# Patient Record
Sex: Male | Born: 1955 | Race: White | Hispanic: No | Marital: Married | Smoking: Former smoker
Health system: Southern US, Community
[De-identification: ages and names within clinical notes are randomized; demographics above are authoritative.]

## PROBLEM LIST (undated history)

## (undated) DIAGNOSIS — E785 Hyperlipidemia, unspecified: Secondary | ICD-10-CM

## (undated) DIAGNOSIS — Z720 Tobacco use: Secondary | ICD-10-CM

## (undated) DIAGNOSIS — F419 Anxiety disorder, unspecified: Secondary | ICD-10-CM

## (undated) DIAGNOSIS — I2510 Atherosclerotic heart disease of native coronary artery without angina pectoris: Secondary | ICD-10-CM

## (undated) DIAGNOSIS — D126 Benign neoplasm of colon, unspecified: Secondary | ICD-10-CM

## (undated) DIAGNOSIS — K222 Esophageal obstruction: Secondary | ICD-10-CM

## (undated) DIAGNOSIS — K2970 Gastritis, unspecified, without bleeding: Secondary | ICD-10-CM

## (undated) DIAGNOSIS — D010 Carcinoma in situ of colon: Secondary | ICD-10-CM

## (undated) DIAGNOSIS — K559 Vascular disorder of intestine, unspecified: Secondary | ICD-10-CM

## (undated) DIAGNOSIS — F32A Depression, unspecified: Secondary | ICD-10-CM

## (undated) DIAGNOSIS — I219 Acute myocardial infarction, unspecified: Secondary | ICD-10-CM

## (undated) DIAGNOSIS — D72829 Elevated white blood cell count, unspecified: Secondary | ICD-10-CM

## (undated) DIAGNOSIS — K649 Unspecified hemorrhoids: Secondary | ICD-10-CM

## (undated) DIAGNOSIS — J45909 Unspecified asthma, uncomplicated: Secondary | ICD-10-CM

## (undated) DIAGNOSIS — I10 Essential (primary) hypertension: Secondary | ICD-10-CM

## (undated) DIAGNOSIS — K5790 Diverticulosis of intestine, part unspecified, without perforation or abscess without bleeding: Secondary | ICD-10-CM

## (undated) DIAGNOSIS — R55 Syncope and collapse: Secondary | ICD-10-CM

## (undated) DIAGNOSIS — J449 Chronic obstructive pulmonary disease, unspecified: Secondary | ICD-10-CM

## (undated) DIAGNOSIS — K219 Gastro-esophageal reflux disease without esophagitis: Secondary | ICD-10-CM

## (undated) DIAGNOSIS — F329 Major depressive disorder, single episode, unspecified: Secondary | ICD-10-CM

## (undated) DIAGNOSIS — K449 Diaphragmatic hernia without obstruction or gangrene: Secondary | ICD-10-CM

## (undated) DIAGNOSIS — G4730 Sleep apnea, unspecified: Secondary | ICD-10-CM

## (undated) DIAGNOSIS — R911 Solitary pulmonary nodule: Secondary | ICD-10-CM

## (undated) DIAGNOSIS — K922 Gastrointestinal hemorrhage, unspecified: Secondary | ICD-10-CM

## (undated) DIAGNOSIS — G2581 Restless legs syndrome: Secondary | ICD-10-CM

## (undated) DIAGNOSIS — K51411 Inflammatory polyps of colon with rectal bleeding: Secondary | ICD-10-CM

## (undated) DIAGNOSIS — A048 Other specified bacterial intestinal infections: Secondary | ICD-10-CM

## (undated) HISTORY — DX: Anxiety disorder, unspecified: F41.9

## (undated) HISTORY — DX: Restless legs syndrome: G25.81

## (undated) HISTORY — DX: Inflammatory polyps of colon with rectal bleeding: K51.411

## (undated) HISTORY — DX: Sleep apnea, unspecified: G47.30

## (undated) HISTORY — DX: Esophageal obstruction: K22.2

## (undated) HISTORY — PX: CARDIAC CATHETERIZATION: SHX172

## (undated) HISTORY — DX: Major depressive disorder, single episode, unspecified: F32.9

## (undated) HISTORY — DX: Gastro-esophageal reflux disease without esophagitis: K21.9

## (undated) HISTORY — DX: Depression, unspecified: F32.A

## (undated) HISTORY — DX: Essential (primary) hypertension: I10

## (undated) HISTORY — DX: Benign neoplasm of colon, unspecified: D12.6

## (undated) HISTORY — PX: OTHER SURGICAL HISTORY: SHX169

## (undated) HISTORY — DX: Diaphragmatic hernia without obstruction or gangrene: K44.9

## (undated) HISTORY — DX: Gastritis, unspecified, without bleeding: K29.70

## (undated) HISTORY — DX: Other specified bacterial intestinal infections: A04.8

## (undated) HISTORY — DX: Carcinoma in situ of colon: D01.0

## (undated) HISTORY — DX: Tobacco use: Z72.0

## (undated) HISTORY — PX: HEMORRHOID BANDING: SHX5850

## (undated) HISTORY — DX: Hyperlipidemia, unspecified: E78.5

## (undated) HISTORY — DX: Diverticulosis of intestine, part unspecified, without perforation or abscess without bleeding: K57.90

## (undated) HISTORY — PX: HAND SURGERY: SHX662

## (undated) HISTORY — DX: Solitary pulmonary nodule: R91.1

## (undated) HISTORY — DX: Atherosclerotic heart disease of native coronary artery without angina pectoris: I25.10

## (undated) HISTORY — DX: Syncope and collapse: R55

## (undated) HISTORY — DX: Chronic obstructive pulmonary disease, unspecified: J44.9

## (undated) HISTORY — DX: Vascular disorder of intestine, unspecified: K55.9

## (undated) HISTORY — DX: Elevated white blood cell count, unspecified: D72.829

## (undated) HISTORY — DX: Gastrointestinal hemorrhage, unspecified: K92.2

## (undated) HISTORY — DX: Unspecified hemorrhoids: K64.9

---

## 1998-05-21 ENCOUNTER — Inpatient Hospital Stay (HOSPITAL_COMMUNITY): Admission: EM | Admit: 1998-05-21 | Discharge: 1998-05-22 | Payer: Self-pay | Admitting: Cardiology

## 1998-10-02 ENCOUNTER — Encounter: Payer: Self-pay | Admitting: Emergency Medicine

## 1998-10-02 ENCOUNTER — Inpatient Hospital Stay (HOSPITAL_COMMUNITY): Admission: EM | Admit: 1998-10-02 | Discharge: 1998-10-04 | Payer: Self-pay | Admitting: Emergency Medicine

## 2000-08-16 ENCOUNTER — Inpatient Hospital Stay (HOSPITAL_COMMUNITY): Admission: AD | Admit: 2000-08-16 | Discharge: 2000-08-17 | Payer: Self-pay | Admitting: Cardiology

## 2000-09-20 ENCOUNTER — Ambulatory Visit (HOSPITAL_COMMUNITY): Admission: RE | Admit: 2000-09-20 | Discharge: 2000-09-20 | Payer: Self-pay | Admitting: Cardiology

## 2001-05-29 ENCOUNTER — Encounter: Payer: Self-pay | Admitting: *Deleted

## 2001-05-29 ENCOUNTER — Inpatient Hospital Stay (HOSPITAL_COMMUNITY): Admission: AD | Admit: 2001-05-29 | Discharge: 2001-06-01 | Payer: Self-pay | Admitting: Cardiology

## 2001-05-30 ENCOUNTER — Encounter: Payer: Self-pay | Admitting: Cardiology

## 2001-05-31 ENCOUNTER — Encounter: Payer: Self-pay | Admitting: Cardiology

## 2001-06-19 ENCOUNTER — Ambulatory Visit (HOSPITAL_COMMUNITY): Admission: RE | Admit: 2001-06-19 | Discharge: 2001-06-19 | Payer: Self-pay | Admitting: Cardiology

## 2002-06-14 DIAGNOSIS — A048 Other specified bacterial intestinal infections: Secondary | ICD-10-CM

## 2002-06-14 DIAGNOSIS — D010 Carcinoma in situ of colon: Secondary | ICD-10-CM

## 2002-06-14 HISTORY — DX: Carcinoma in situ of colon: D01.0

## 2002-06-14 HISTORY — PX: COLONOSCOPY W/ POLYPECTOMY: SHX1380

## 2002-06-14 HISTORY — PX: CHOLECYSTECTOMY: SHX55

## 2002-06-14 HISTORY — DX: Other specified bacterial intestinal infections: A04.8

## 2002-11-07 ENCOUNTER — Ambulatory Visit (HOSPITAL_COMMUNITY): Admission: RE | Admit: 2002-11-07 | Discharge: 2002-11-07 | Payer: Self-pay | Admitting: Internal Medicine

## 2002-11-09 ENCOUNTER — Encounter (INDEPENDENT_AMBULATORY_CARE_PROVIDER_SITE_OTHER): Payer: Self-pay | Admitting: Internal Medicine

## 2002-11-09 ENCOUNTER — Ambulatory Visit (HOSPITAL_COMMUNITY): Admission: RE | Admit: 2002-11-09 | Discharge: 2002-11-09 | Payer: Self-pay | Admitting: Internal Medicine

## 2002-11-16 ENCOUNTER — Emergency Department (HOSPITAL_COMMUNITY): Admission: EM | Admit: 2002-11-16 | Discharge: 2002-11-16 | Payer: Self-pay | Admitting: Emergency Medicine

## 2003-03-28 ENCOUNTER — Ambulatory Visit (HOSPITAL_COMMUNITY): Admission: RE | Admit: 2003-03-28 | Discharge: 2003-03-28 | Payer: Self-pay | Admitting: Family Medicine

## 2003-03-28 ENCOUNTER — Encounter: Payer: Self-pay | Admitting: Family Medicine

## 2003-04-24 ENCOUNTER — Ambulatory Visit (HOSPITAL_COMMUNITY): Admission: RE | Admit: 2003-04-24 | Discharge: 2003-04-24 | Payer: Self-pay | Admitting: Cardiology

## 2003-08-09 ENCOUNTER — Ambulatory Visit (HOSPITAL_COMMUNITY): Admission: RE | Admit: 2003-08-09 | Discharge: 2003-08-09 | Payer: Self-pay | Admitting: Internal Medicine

## 2004-06-18 ENCOUNTER — Encounter (HOSPITAL_COMMUNITY): Admission: RE | Admit: 2004-06-18 | Discharge: 2004-06-19 | Payer: Self-pay | Admitting: Family Medicine

## 2004-07-19 ENCOUNTER — Ambulatory Visit: Admission: RE | Admit: 2004-07-19 | Discharge: 2004-07-19 | Payer: Self-pay | Admitting: Family Medicine

## 2004-07-19 ENCOUNTER — Ambulatory Visit: Payer: Self-pay | Admitting: Pulmonary Disease

## 2004-08-26 ENCOUNTER — Ambulatory Visit (HOSPITAL_COMMUNITY): Admission: RE | Admit: 2004-08-26 | Discharge: 2004-08-26 | Payer: Self-pay | Admitting: Family Medicine

## 2004-09-14 ENCOUNTER — Ambulatory Visit (HOSPITAL_COMMUNITY): Admission: RE | Admit: 2004-09-14 | Discharge: 2004-09-14 | Payer: Self-pay | Admitting: Family Medicine

## 2004-10-08 ENCOUNTER — Ambulatory Visit: Payer: Self-pay | Admitting: Cardiology

## 2004-10-23 ENCOUNTER — Ambulatory Visit: Payer: Self-pay | Admitting: Internal Medicine

## 2004-11-23 ENCOUNTER — Ambulatory Visit: Payer: Self-pay | Admitting: Internal Medicine

## 2004-12-02 ENCOUNTER — Ambulatory Visit (HOSPITAL_COMMUNITY): Admission: RE | Admit: 2004-12-02 | Discharge: 2004-12-02 | Payer: Self-pay | Admitting: Internal Medicine

## 2004-12-03 ENCOUNTER — Ambulatory Visit: Payer: Self-pay | Admitting: Cardiology

## 2005-01-21 ENCOUNTER — Ambulatory Visit: Payer: Self-pay | Admitting: Internal Medicine

## 2005-02-22 ENCOUNTER — Ambulatory Visit: Payer: Self-pay | Admitting: Internal Medicine

## 2005-03-22 ENCOUNTER — Ambulatory Visit: Payer: Self-pay | Admitting: Internal Medicine

## 2005-06-21 ENCOUNTER — Ambulatory Visit: Payer: Self-pay | Admitting: Internal Medicine

## 2005-07-02 ENCOUNTER — Ambulatory Visit: Payer: Self-pay | Admitting: Internal Medicine

## 2005-09-20 ENCOUNTER — Ambulatory Visit: Payer: Self-pay | Admitting: Internal Medicine

## 2006-03-21 ENCOUNTER — Ambulatory Visit: Payer: Self-pay | Admitting: Internal Medicine

## 2006-07-07 ENCOUNTER — Ambulatory Visit: Payer: Self-pay | Admitting: Cardiology

## 2006-07-11 ENCOUNTER — Ambulatory Visit: Payer: Self-pay | Admitting: Cardiology

## 2006-07-11 ENCOUNTER — Encounter (HOSPITAL_COMMUNITY): Admission: RE | Admit: 2006-07-11 | Discharge: 2006-08-10 | Payer: Self-pay | Admitting: Cardiology

## 2006-07-15 ENCOUNTER — Ambulatory Visit (HOSPITAL_COMMUNITY): Admission: RE | Admit: 2006-07-15 | Discharge: 2006-07-15 | Payer: Self-pay | Admitting: Family Medicine

## 2006-07-18 ENCOUNTER — Ambulatory Visit: Payer: Self-pay | Admitting: Cardiology

## 2006-12-08 ENCOUNTER — Ambulatory Visit: Payer: Self-pay | Admitting: Internal Medicine

## 2007-03-15 ENCOUNTER — Ambulatory Visit (HOSPITAL_COMMUNITY): Admission: RE | Admit: 2007-03-15 | Discharge: 2007-03-15 | Payer: Self-pay | Admitting: Family Medicine

## 2007-03-16 ENCOUNTER — Ambulatory Visit: Payer: Self-pay | Admitting: Internal Medicine

## 2007-03-21 ENCOUNTER — Inpatient Hospital Stay (HOSPITAL_BASED_OUTPATIENT_CLINIC_OR_DEPARTMENT_OTHER): Admission: RE | Admit: 2007-03-21 | Discharge: 2007-03-21 | Payer: Self-pay | Admitting: Cardiology

## 2007-03-21 ENCOUNTER — Ambulatory Visit: Payer: Self-pay | Admitting: Cardiology

## 2007-03-31 DIAGNOSIS — J449 Chronic obstructive pulmonary disease, unspecified: Secondary | ICD-10-CM | POA: Insufficient documentation

## 2007-03-31 DIAGNOSIS — F418 Other specified anxiety disorders: Secondary | ICD-10-CM | POA: Insufficient documentation

## 2007-03-31 DIAGNOSIS — G4733 Obstructive sleep apnea (adult) (pediatric): Secondary | ICD-10-CM | POA: Insufficient documentation

## 2007-04-05 ENCOUNTER — Ambulatory Visit: Payer: Self-pay | Admitting: Pulmonary Disease

## 2007-04-06 ENCOUNTER — Ambulatory Visit: Payer: Self-pay | Admitting: Cardiology

## 2007-05-04 ENCOUNTER — Ambulatory Visit: Payer: Self-pay | Admitting: Internal Medicine

## 2007-05-05 ENCOUNTER — Ambulatory Visit: Payer: Self-pay | Admitting: Cardiology

## 2007-05-30 ENCOUNTER — Encounter: Payer: Self-pay | Admitting: Internal Medicine

## 2007-05-30 ENCOUNTER — Ambulatory Visit (HOSPITAL_COMMUNITY): Admission: RE | Admit: 2007-05-30 | Discharge: 2007-05-30 | Payer: Self-pay | Admitting: Internal Medicine

## 2007-08-04 ENCOUNTER — Ambulatory Visit: Payer: Self-pay | Admitting: Internal Medicine

## 2007-08-24 ENCOUNTER — Ambulatory Visit: Payer: Self-pay | Admitting: Internal Medicine

## 2007-09-06 ENCOUNTER — Ambulatory Visit: Payer: Self-pay | Admitting: Internal Medicine

## 2007-11-01 ENCOUNTER — Ambulatory Visit: Payer: Self-pay | Admitting: Internal Medicine

## 2007-11-23 ENCOUNTER — Ambulatory Visit: Payer: Self-pay | Admitting: Internal Medicine

## 2008-04-15 ENCOUNTER — Ambulatory Visit: Payer: Self-pay | Admitting: Cardiology

## 2008-04-18 ENCOUNTER — Encounter (HOSPITAL_COMMUNITY): Admission: RE | Admit: 2008-04-18 | Discharge: 2008-05-18 | Payer: Self-pay | Admitting: Cardiology

## 2008-04-24 ENCOUNTER — Ambulatory Visit: Payer: Self-pay | Admitting: Cardiology

## 2008-05-02 ENCOUNTER — Ambulatory Visit: Payer: Self-pay | Admitting: Cardiology

## 2008-05-03 ENCOUNTER — Ambulatory Visit: Payer: Self-pay | Admitting: Internal Medicine

## 2008-08-09 ENCOUNTER — Ambulatory Visit (HOSPITAL_COMMUNITY): Admission: RE | Admit: 2008-08-09 | Discharge: 2008-08-09 | Payer: Self-pay | Admitting: Family Medicine

## 2008-08-14 ENCOUNTER — Ambulatory Visit: Payer: Self-pay | Admitting: Internal Medicine

## 2008-08-27 ENCOUNTER — Encounter: Payer: Self-pay | Admitting: Internal Medicine

## 2008-08-27 ENCOUNTER — Ambulatory Visit: Payer: Self-pay | Admitting: Internal Medicine

## 2008-08-27 ENCOUNTER — Ambulatory Visit (HOSPITAL_COMMUNITY): Admission: RE | Admit: 2008-08-27 | Discharge: 2008-08-27 | Payer: Self-pay | Admitting: Internal Medicine

## 2008-08-29 ENCOUNTER — Encounter: Payer: Self-pay | Admitting: Internal Medicine

## 2008-08-29 ENCOUNTER — Ambulatory Visit: Payer: Self-pay | Admitting: Cardiology

## 2008-10-01 DIAGNOSIS — F411 Generalized anxiety disorder: Secondary | ICD-10-CM | POA: Insufficient documentation

## 2008-10-01 DIAGNOSIS — E785 Hyperlipidemia, unspecified: Secondary | ICD-10-CM | POA: Insufficient documentation

## 2008-10-01 DIAGNOSIS — I10 Essential (primary) hypertension: Secondary | ICD-10-CM | POA: Insufficient documentation

## 2009-02-12 HISTORY — PX: ESOPHAGOGASTRODUODENOSCOPY: SHX1529

## 2009-05-02 ENCOUNTER — Ambulatory Visit: Payer: Self-pay | Admitting: Internal Medicine

## 2009-05-22 ENCOUNTER — Ambulatory Visit (HOSPITAL_COMMUNITY): Admission: RE | Admit: 2009-05-22 | Discharge: 2009-05-22 | Payer: Self-pay | Admitting: Family Medicine

## 2009-06-11 ENCOUNTER — Encounter (INDEPENDENT_AMBULATORY_CARE_PROVIDER_SITE_OTHER): Payer: Self-pay | Admitting: *Deleted

## 2009-06-11 LAB — CONVERTED CEMR LAB
Creatinine, Ser: 0.77 mg/dL
GFR calc non Af Amer: >60 mL/min
Glomerular Filtration Rate, Af Am: >60 mL/min/{1.73_m2}
Glucose, Bld: 99 mg/dL
MCV: 92 fL
WBC: 7 10*3/uL

## 2009-06-14 DIAGNOSIS — D126 Benign neoplasm of colon, unspecified: Secondary | ICD-10-CM

## 2009-06-14 DIAGNOSIS — K559 Vascular disorder of intestine, unspecified: Secondary | ICD-10-CM

## 2009-06-14 HISTORY — PX: COLONOSCOPY: SHX174

## 2009-06-14 HISTORY — DX: Benign neoplasm of colon, unspecified: D12.6

## 2009-06-14 HISTORY — DX: Vascular disorder of intestine, unspecified: K55.9

## 2009-06-27 ENCOUNTER — Ambulatory Visit: Payer: Self-pay | Admitting: Internal Medicine

## 2009-06-27 DIAGNOSIS — K921 Melena: Secondary | ICD-10-CM | POA: Insufficient documentation

## 2009-06-29 LAB — CONVERTED CEMR LAB
Eosinophils Absolute: 0 10*3/uL (ref 0.0–0.7)
HCT: 54.7 % — ABNORMAL HIGH (ref 39.0–52.0)
Hemoglobin: 17 g/dL (ref 13.0–17.0)
Lymphs Abs: 2 10*3/uL (ref 0.7–4.0)
MCHC: 31.1 g/dL (ref 30.0–36.0)
MCV: 103 fL — ABNORMAL HIGH (ref 78.0–100.0)
Monocytes Relative: 6 % (ref 3–12)
Neutro Abs: 6.9 10*3/uL (ref 1.7–7.7)
Platelets: 181 10*3/uL (ref 150–400)
RDW: 14.7 % (ref 11.5–15.5)
WBC: 9.5 10*3/uL (ref 4.0–10.5)

## 2009-07-01 ENCOUNTER — Encounter: Payer: Self-pay | Admitting: Internal Medicine

## 2009-07-02 ENCOUNTER — Ambulatory Visit: Payer: Self-pay | Admitting: Internal Medicine

## 2009-07-02 ENCOUNTER — Ambulatory Visit (HOSPITAL_COMMUNITY): Admission: RE | Admit: 2009-07-02 | Discharge: 2009-07-02 | Payer: Self-pay | Admitting: Internal Medicine

## 2009-07-03 ENCOUNTER — Encounter: Payer: Self-pay | Admitting: Internal Medicine

## 2009-07-04 ENCOUNTER — Encounter: Payer: Self-pay | Admitting: Internal Medicine

## 2009-08-12 ENCOUNTER — Encounter (INDEPENDENT_AMBULATORY_CARE_PROVIDER_SITE_OTHER): Payer: Self-pay | Admitting: *Deleted

## 2009-08-12 LAB — CONVERTED CEMR LAB
Alkaline Phosphatase: 63 units/L
CO2: 26 meq/L
Calcium: 9.6 mg/dL
Chloride: 104 meq/L
Creatinine, Ser: 0.66 mg/dL
Glucose, Bld: 98 mg/dL
HCT: 46.9 %
Hemoglobin: 16.7 g/dL
MCV: 90 fL
Platelets: 221 10*3/uL
Sodium: 140 meq/L
Total Protein: 6.7 g/dL
Triglycerides: 146 mg/dL
WBC: 9.5 10*3/uL

## 2009-09-23 ENCOUNTER — Encounter (INDEPENDENT_AMBULATORY_CARE_PROVIDER_SITE_OTHER): Payer: Self-pay | Admitting: *Deleted

## 2009-09-23 DIAGNOSIS — F17200 Nicotine dependence, unspecified, uncomplicated: Secondary | ICD-10-CM

## 2009-09-23 DIAGNOSIS — F17210 Nicotine dependence, cigarettes, uncomplicated: Secondary | ICD-10-CM | POA: Insufficient documentation

## 2009-09-23 DIAGNOSIS — I2510 Atherosclerotic heart disease of native coronary artery without angina pectoris: Secondary | ICD-10-CM | POA: Insufficient documentation

## 2009-09-24 ENCOUNTER — Ambulatory Visit: Payer: Self-pay | Admitting: Cardiology

## 2009-09-26 ENCOUNTER — Encounter: Payer: Self-pay | Admitting: Cardiology

## 2009-10-10 ENCOUNTER — Encounter (INDEPENDENT_AMBULATORY_CARE_PROVIDER_SITE_OTHER): Payer: Self-pay | Admitting: *Deleted

## 2009-12-22 ENCOUNTER — Encounter (INDEPENDENT_AMBULATORY_CARE_PROVIDER_SITE_OTHER): Payer: Self-pay | Admitting: *Deleted

## 2009-12-22 ENCOUNTER — Ambulatory Visit: Payer: Self-pay | Admitting: Cardiology

## 2009-12-23 ENCOUNTER — Encounter: Payer: Self-pay | Admitting: Cardiology

## 2009-12-29 ENCOUNTER — Telehealth (INDEPENDENT_AMBULATORY_CARE_PROVIDER_SITE_OTHER): Payer: Self-pay | Admitting: *Deleted

## 2009-12-31 ENCOUNTER — Encounter (INDEPENDENT_AMBULATORY_CARE_PROVIDER_SITE_OTHER): Payer: Self-pay | Admitting: *Deleted

## 2009-12-31 LAB — CONVERTED CEMR LAB
Basophils Relative: 0 %
Hemoglobin: 16.1 g/dL
Lymphs Abs: 1.8 10*3/uL
MCHC: 31.9 g/dL
RDW: 13.9 %
WBC: 8.9 10*3/uL

## 2010-01-01 ENCOUNTER — Ambulatory Visit: Payer: Self-pay | Admitting: Cardiology

## 2010-01-01 ENCOUNTER — Encounter (HOSPITAL_COMMUNITY): Admission: RE | Admit: 2010-01-01 | Discharge: 2010-01-31 | Payer: Self-pay | Admitting: Cardiology

## 2010-01-02 ENCOUNTER — Ambulatory Visit (HOSPITAL_COMMUNITY): Admission: RE | Admit: 2010-01-02 | Discharge: 2010-01-02 | Payer: Self-pay | Admitting: Family Medicine

## 2010-01-16 ENCOUNTER — Encounter (INDEPENDENT_AMBULATORY_CARE_PROVIDER_SITE_OTHER): Payer: Self-pay | Admitting: *Deleted

## 2010-01-23 ENCOUNTER — Encounter (INDEPENDENT_AMBULATORY_CARE_PROVIDER_SITE_OTHER): Payer: Self-pay | Admitting: *Deleted

## 2010-02-09 ENCOUNTER — Encounter (INDEPENDENT_AMBULATORY_CARE_PROVIDER_SITE_OTHER): Payer: Self-pay | Admitting: *Deleted

## 2010-02-11 ENCOUNTER — Encounter: Payer: Self-pay | Admitting: Cardiology

## 2010-02-18 ENCOUNTER — Encounter (INDEPENDENT_AMBULATORY_CARE_PROVIDER_SITE_OTHER): Payer: Self-pay | Admitting: *Deleted

## 2010-02-19 ENCOUNTER — Ambulatory Visit: Payer: Self-pay | Admitting: Cardiology

## 2010-02-19 ENCOUNTER — Ambulatory Visit (HOSPITAL_COMMUNITY): Admission: RE | Admit: 2010-02-19 | Discharge: 2010-02-19 | Payer: Self-pay | Admitting: Cardiology

## 2010-02-19 LAB — CONVERTED CEMR LAB
BUN: 10 mg/dL (ref 6–23)
Basophils Absolute: 0 10*3/uL (ref 0.0–0.1)
Basophils Relative: 0 % (ref 0–1)
Chloride: 104 meq/L (ref 96–112)
Eosinophils Relative: 1 % (ref 0–5)
Glucose, Bld: 62 mg/dL — ABNORMAL LOW (ref 70–99)
Lymphocytes Relative: 27 % (ref 12–46)
Lymphs Abs: 2.1 10*3/uL (ref 0.7–4.0)
MCHC: 35.6 g/dL (ref 30.0–36.0)
Monocytes Absolute: 0.4 10*3/uL (ref 0.1–1.0)
Monocytes Relative: 5 % (ref 3–12)
Neutro Abs: 5.3 10*3/uL (ref 1.7–7.7)
Platelets: 189 10*3/uL (ref 150–400)
Potassium: 4.1 meq/L (ref 3.5–5.3)
RBC: 5.12 M/uL (ref 4.22–5.81)
RDW: 13.1 % (ref 11.5–15.5)
WBC: 7.8 10*3/uL (ref 4.0–10.5)
aPTT: 34 s (ref 24–37)

## 2010-02-23 ENCOUNTER — Ambulatory Visit: Payer: Self-pay | Admitting: Internal Medicine

## 2010-02-23 ENCOUNTER — Inpatient Hospital Stay (HOSPITAL_BASED_OUTPATIENT_CLINIC_OR_DEPARTMENT_OTHER): Admission: RE | Admit: 2010-02-23 | Discharge: 2010-02-23 | Payer: Self-pay | Admitting: Internal Medicine

## 2010-02-23 ENCOUNTER — Encounter (INDEPENDENT_AMBULATORY_CARE_PROVIDER_SITE_OTHER): Payer: Self-pay | Admitting: *Deleted

## 2010-03-11 ENCOUNTER — Ambulatory Visit: Payer: Self-pay | Admitting: Cardiology

## 2010-06-16 ENCOUNTER — Emergency Department (HOSPITAL_COMMUNITY)
Admission: EM | Admit: 2010-06-16 | Discharge: 2010-06-16 | Payer: Self-pay | Source: Home / Self Care | Admitting: Emergency Medicine

## 2010-07-01 ENCOUNTER — Ambulatory Visit (HOSPITAL_COMMUNITY)
Admission: RE | Admit: 2010-07-01 | Discharge: 2010-07-01 | Payer: Self-pay | Source: Home / Self Care | Attending: Psychology | Admitting: Psychology

## 2010-07-04 ENCOUNTER — Encounter: Payer: Self-pay | Admitting: *Deleted

## 2010-07-14 NOTE — Letter (Signed)
Summary: Patient Notice, Colon Biopsy Results  [facility]  [address]   [place], [place] [address]   Phone: [phone]  Fax: [phone]       July 04, 2009   LAUTARO KORAL [address] [place], [place]  [address] 20-Dec-1955    Dear Mr. Vreeland,  I am pleased to inform you that the biopsies taken during your recent colonoscopy did not show any evidence of cancer upon pathologic examination.  Additional information/recommendations:  You should have a repeat colonoscopy examination  in 5 years.  Please call us if you are having persistent problems or have questions about your condition that have not been fully answered at this time.  Sincerely,    R. Roetta Sessions MD  [facility] Ph: [phone]    Fax: [phone]   Appended Document: Patient Notice, Colon Biopsy Results mailed letter to pt

## 2010-07-14 NOTE — Letter (Signed)
Summary: [name] (Nuc Med Stress)  [facility]  [address], [place] [address]   Phone: [phone]  Fax: [phone]    Nuclear Medicine 1-Day Stress Test Information Sheet  Re:     [name]   DOB:     29-Dec-1955 MRN:     [id number] Weight:  Appointment Date: Register at: Appointment Time: Referring MD:  _x__Exercise Stress  __Adenosine   __Dobutamine  __Lexiscan  __Persantine   __Thallium  Urgency: ____1 (next day)   ____2 (one week)    ____3 (PRN)  Patient will receive Follow Up call with results: Patient needs follow-up appointment:  Instructions regarding medication:  How to prepare for your stress test: 1. DO NOT eat or dring 6 hours prior to your arrival time. This includes no caffeine (coffee, tea, sodas, chocolate) if you were instructed to take your medications, drink water with it. 2. DO NOT use any tobacco products for at leaset 8 hours prior to arrival. 3. DO NOT wear dresses or any clothing that may have metal clasps or buttons. 4. Wear short sleeve shirts, loose clothing, and comfortalbe walking shoes. 5. DO NOT use lotions, oils or powder on your chest before the test. 6. The test will take approximately 3-4 hours from the time you arrive until completion. 7. To register the day of the test, go to the Short Stay entrance at [facility]. 8. If you must cancel your test, call [phone] as soon as you are aware.  After you arrive for test:   When you arrive at [facility], you will go to Short Stay to be registered. They will then send you to Radiology to check in. The Nuclear Medicine Tech will get you and start an IV in your arm or hand. A small amount of a radioactive tracer will then be injected into your IV. This tracer will then have to circulate for 30-45 minutes. During this time you will wait in the waiting room and you will be able to drink something without caffeine. A series of pictures will be taken  of your heart follwoing this waiting period. After the 1st set of pictures you will go to the stress lab to get ready for your stress test. During the stress test, another small amount of a radioactive tracer will be injected through your IV. When the stress test is complete, there is a short rest period while your heart rate and blood pressure will be monitored. When this monitoring period is complete you will have another set of pictrues taken. (The same as the 1st set of pictures). These pictures are taken between 15 minutes and 1 hour after the stress test. The time depends on the type of stress test you had. Your doctor will inform you of your test results within 7 days after test.    The possibilities of certain changes are possible during the test. They include abnormal blood pressure and disorders of the heart. Side effects of persantine or adenosine can include flushing, chest pain, shortness of breath, stomach tightness, headache and light-headedness. These side effects usually do not last long and are self-resolving. Every effort will be made to keep you comfortable and to minimize complications by obtaining a medical history and by close observation during the test. Emergency equipment, medications, and trained personnel are available to deal with any unusual situation which may arise.  Please notify office at least 48 hours in advance if you are unable to keep  this appt.

## 2010-07-14 NOTE — Miscellaneous (Signed)
Summary: l12/29/2010 [facility]  Clinical Lists Changes  Observations: Added new observation of CALCIUM: 9.3 mg/dL (30/86/5784 69:62) Added new observation of GFR AA: >60 mL/min/1.85m2 (06/11/2009 16:09) Added new observation of GFR: >60 mL/min (06/11/2009 16:09) Added new observation of CREATININE: 0.77 mg/dL (95/28/4132 44:01) Added new observation of BUN: 7 mg/dL (02/72/5366 44:03) Added new observation of BG RANDOM: 99 mg/dL (47/42/5956 38:75) Added new observation of CO2 PLSM/SER: 26.0 meq/L (06/11/2009 16:09) Added new observation of CL SERUM: 105 meq/L (06/11/2009 16:09) Added new observation of K SERUM: 4.2 meq/L (06/11/2009 16:09) Added new observation of NA: 139 meq/L (06/11/2009 16:09) Added new observation of PLATELETK/UL: 219 K/uL (06/11/2009 16:09) Added new observation of MCV: 92.0 fL (06/11/2009 16:09) Added new observation of HCT: 46.5 % (06/11/2009 16:09) Added new observation of HGB: 16.5 g/dL (64/33/2951 88:41) Added new observation of WBC COUNT: 7.0 10*3/microliter (06/11/2009 16:09)

## 2010-07-14 NOTE — Letter (Signed)
Summary: DR Orson Aloe PROGRESS NOTE 01/23/10  DR HENDERSON PROGRESS NOTE 01/23/10   Imported By: Faythe Ghee 02/11/2010 11:12:41  _____________________________________________________________________  External Attachment:    Type:   Image     Comment:   External Document

## 2010-07-14 NOTE — Letter (Signed)
Summary: [name]   Imported By: Faythe Ghee 09/26/2009 15:42:21  _____________________________________________________________________  External Attachment:    Type:   Image     Comment:   External Document

## 2010-07-14 NOTE — Assessment & Plan Note (Signed)
Summary: 1 YR F/U PER CKOUT 08/29/08-DSF  Medications Added HYDROCODONE-ACETAMINOPHEN 5-500 MG  TABS (HYDROCODONE-ACETAMINOPHEN) take one tablet every  4 hours as needed pain      Allergies Added: NKDA  Visit Type:  Follow-up Primary Provider:  Dr. [name]   History of Present Illness: [name] returns to the office as scheduled for continued assessment and treatment of coronary artery disease and cardiovascular risk factors.  Since I last saw him, he was admitted to [facility] with "fluid on his lungs".  Those records have been requested but are not currently available to [place].  He is uncertain as to whether he was seen by cardiologist.  No modifications in his medical regime were advised.  Otherwise, he is stable.  He reports occasional episodes of chest discomfort with rare use of sublingual nitroglycerin, which is effective.  He has chronic class 2-3 dyspnea on exertion.  He is physically inactive and continues to smoke cigarettes at the rate of one half pack per day.  Current Medications (verified): 1)  Lisinopril 20 Mg  Tabs (Lisinopril) .... Take 1 Tablet By Mouth Once A Day 2)  Sertraline Hcl 100 Mg  Tabs (Sertraline Hcl) .... Take 1 Tablet By Mouth Once A Day 3)  Vytorin 10-80 Mg  Tabs (Ezetimibe-Simvastatin) .Marland Kitchen.. 1 By Mouth At Bedtime 4)  Adult Aspirin Ec Low Strength 81 Mg  Tbec (Aspirin) .Marland Kitchen.. 1 By Mouth Once Daily 5)  Symbicort 160-4.5 Mcg/act  Aero (Budesonide-Formoterol Fumarate) .... As Directed 6)  Albuterol Sulfate (2.5 Mg/32ml) 0.083%  Nebu (Albuterol Sulfate) .... As Needed 7)  Ventolin Hfa 108 (90 Base) Mcg/act Aers (Albuterol Sulfate) .... 2 Puffs Four Times A Day As Needed 8)  Cpap 10cm 9)  Hydrocodone-Acetaminophen 5-500 Mg  Tabs (Hydrocodone-Acetaminophen) .... Take One Tablet Every  4 Hours As Needed Pain 10)  Alprazolam 0.5 Mg Tabs (Alprazolam) .... Take One Two Times A Day As Needed  Allergies (verified): No Known Drug Allergies  Past  History:  PMH, FH, and Social History reviewed and updated.  Review of Systems       The patient complains of chest pain and dyspnea on exertion.  The patient denies anorexia, fever, weight loss, weight gain, vision loss, decreased hearing, hoarseness, syncope, peripheral edema, prolonged cough, headaches, hemoptysis, abdominal pain, melena, and hematochezia.    Vital Signs:  Patient profile:   55 year old male Weight:      158 pounds Pulse rate:   72 / minute BP sitting:   137 / 84  (right arm)  Vitals Entered By: Dreama Saa, CNA (September 24, 2009 11:19 AM)  Physical Exam  General:    Proportionate height and weight; well developed; no acute distress:   Neck-No JVD; no carotid bruits: Lungs-No tachypnea, no rales; minimal expiratory rhonchi; no wheezes: Cardiovascular-normal PMI; normal S1 and S2:S4 present Abdomen-BS normal; soft and non-tender without masses or organomegaly:  Musculoskeletal-No deformities, no cyanosis or clubbing: Neurologic-Normal cranial nerves; symmetric strength and tone:  Skin-Warm, no significant lesions: Extremities-Nl distal pulses; no edema:     Impression & Recommendations:  Problem # 1:  ATHEROSCLEROTIC CARDIOVASCULAR DISEASE (ICD-429.2) Patient appears stable except for his admission to [facility].  Records will be reviewed and appropriate action taken.  Our major effort at this point is to control his cardiovascular risk factors.  Problem # 2:  HYPERCHOLESTEROLEMIA (ICD-272.0) Recent lipid levels are excellent as noted above.  Current therapy will be continued.  Problem # 3:  HYPERTENSION (ICD-401.9) Blood  pressure control is good, and no modification in his antihypertensive regimen as necessary.  Problem # 4:  TOBACCO ABUSE (ICD-305.1) [name] once quit smoking for approximately 2 months.  He appears to have some motivation to stop tobacco use, but I'm not sure this is strong enough to enable him to do so.  He would like to  start with an over-the-counter agent, Commit, and proceed to nicotine patches if that is ineffective.  I will see him again in 2 months to determine if a pharmacologic approach is appropriate and for reassessment in light of the information in his [facility] chart.  Clinical Review Panels:  Lab Work BUN 12 (08/12/2009) Creatinine 0.66 (08/12/2009)  Lipid Levels LDL 84 (08/12/2009) HDL 36 (08/12/2009) Cholesterol 149 (08/12/2009)    Patient Instructions: 1)  Your physician recommends that you schedule a follow-up appointment in: 2 months 2)  Your physician discussed the hazards of tobacco use.  Tobacco use cessation is recommended and techniques and options to help you quit were discussed.

## 2010-07-14 NOTE — Letter (Signed)
Summary: TCS ORDER  TCS ORDER   Imported By: Ave Filter 07/01/2009 13:06:02  _____________________________________________________________________  External Attachment:    Type:   Image     Comment:   External Document

## 2010-07-14 NOTE — Assessment & Plan Note (Signed)
Summary: post Cath per Loraine Leriche in [facility]/tg  Medications Added ISOSORBIDE MONONITRATE CR 30 MG XR24H-TAB (ISOSORBIDE MONONITRATE) Take1/2 tablet by mouth daily      Allergies Added: NKDA  Visit Type:  Follow-up Referring Kwabena Strutz:  . Primary Arly Salminen:  Dr. [name]  CC:  some sob.  History of Present Illness: [name] is a 55 y/o CM with known history of CAD, COPD, and ongoing tobacco abuse.  He presents today after undergoing cardiac catherization for ongoing chest pressure and SOB.  The catherization was completed by Dr. [name] on 02/23/2010 and revealed stable CAD with high grade lesions in the smal OM 2 and RCA, both of which were too small for PCI.  He was also found to have very mild right renal artery stenosis.  He was recommended for continuned medical therapy.    Since catherization, he continues to have DOE and mild chest pressure. He unfortunately continues to smoke but has cut down to 1/3 ppd from 2 ppd.  He states that he is not using his inhalers as directed but only on a as needed basis.  He denies any pain at the cath site.  Current Medications (verified): 1)  Lisinopril 20 Mg  Tabs (Lisinopril) .... Take 1 Tablet By Mouth Once A Day 2)  Sertraline Hcl 100 Mg  Tabs (Sertraline Hcl) .... Take 1 Tablet By Mouth Once A Day 3)  Adult Aspirin Ec Low Strength 81 Mg  Tbec (Aspirin) .Marland Kitchen.. 1 By Mouth Once Daily 4)  Albuterol Sulfate (2.5 Mg/61ml) 0.083%  Nebu (Albuterol Sulfate) .... As Needed 5)  Cpap 10cm 6)  Hydrocodone-Acetaminophen 5-500 Mg  Tabs (Hydrocodone-Acetaminophen) .... Take One Tablet Every  4 Hours As Needed Pain 7)  Alprazolam 0.5 Mg Tabs (Alprazolam) .... Take One Two Times A Day As Needed 8)  Vytorin 10-80 Mg Tabs (Ezetimibe-Simvastatin) .... Take 1 Tab Daily 9)  Combivent 18-103 Mcg/act Aero (Ipratropium-Albuterol) .... 2 Puffs Three Times A Day 10)  Symbicort 80-4.5 Mcg/act Aero (Budesonide-Formoterol Fumarate) .Marland Kitchen.. 1 Puff Two Times A Day 11)   Gabapentin 300 Mg Caps (Gabapentin) .... Take 1 Tab Three Times A Day 12)  Androgel Pump 1.25 Gm/act (1%) Gel (Testosterone) .... Apply To Thigh 4 Times Daily 13)  Isosorbide Mononitrate Cr 30 Mg Xr24h-Tab (Isosorbide Mononitrate) .... Take1/2 Tablet By Mouth Daily  Allergies (verified): No Known Drug Allergies  Review of Systems       Chronic  All other systems have been reviewed and are negative unless stated above.   Vital Signs:  Patient profile:   55 year old male Weight:      153 pounds Pulse rate:   73 / minute BP sitting:   127 / 82  (right arm)  Vitals Entered By: Dreama Saa, CNA (March 11, 2010 10:34 AM)  Physical Exam  General:  Well developed, well nourished, in no acute distress. Lungs:  Clear bilaterally to auscultation and percussion. Smells of cigarrettes Heart:  RRR without MRG. Abdomen:  Bowel sounds positive; abdomen soft and non-tender without masses, organomegaly, or hernias noted. No hepatosplenomegaly. Msk:  Back normal, normal gait. Muscle strength and tone normal. Pulses:  pulses normal in all 4 extremities Extremities:  No clubbing or cyanosis. Neurologic:  Alert and oriented x 3. Psych:  depressed affect.     Impression & Recommendations:  Problem # 1:  CORONARY ATHEROSCLEROSIS NATIVE CORONARY ARTERY (ICD-414.01) Cardiac catherization revealed stable CAD with two sites-OM2 and RCA with high grade disease that are too small  for PCI.  I have given him RX for isosorbide mono 15mg  daily to assist with angina.  He will continue ACE and ASA. Can consider changing to CCB should he continue to have pain despite long acting nitrates.  Will see him back in 6 months unless he becomes symptomatic. His updated medication list for this problem includes:    Lisinopril 20 Mg Tabs (Lisinopril) .Marland Kitchen... Take 1 tablet by mouth once a day    Adult Aspirin Ec Low Strength 81 Mg Tbec (Aspirin) .Marland Kitchen... 1 by mouth once daily    Isosorbide Mononitrate Cr 30 Mg Xr24h-tab  (Isosorbide mononitrate) .Marland Kitchen... Take1/2 tablet by mouth daily  Problem # 2:  COPD (ICD-496) He admits to not taking his IH meds as directed and only when he is SOB.  I have advised him to take as directed and given him a copy of his medication list so that he can see which medications to take for COPD and their doseage times.   His updated medication list for this problem includes:    Albuterol Sulfate (2.5 Mg/77ml) 0.083% Nebu (Albuterol sulfate) .Marland Kitchen... As needed    Combivent 18-103 Mcg/act Aero (Ipratropium-albuterol) .Marland Kitchen... 2 puffs three times a day    Symbicort 80-4.5 Mcg/act Aero (Budesonide-formoterol fumarate) .Marland Kitchen... 1 puff two times a day  Problem # 3:  TOBACCO ABUSE (ICD-305.1) I have counseled him again on smoking cessation and advised him also on the monetary burden this is placing on him. He complains that he is having trouble affording is medications, and I have reminided him that he will save money by quitting and putting the cigarrette money toward his health care and medications.  He is down to 1/3 ppd from 2ppd.  Patient Instructions: 1)  Your physician recommends that you schedule a follow-up appointment in: 6 months 2)  Your physician has recommended you make the following change in your medication: start isosorbide 15mg  daily, tablet comes in a 30mg  only(take 1/2 tablet daily) Prescriptions: ISOSORBIDE MONONITRATE CR 30 MG XR24H-TAB (ISOSORBIDE MONONITRATE) Take1/2 tablet by mouth daily  #30 x 3   Entered by:   Teressa Lower RN   Authorized by:   Joni Reining, NP   Signed by:   Teressa Lower RN on 03/11/2010   Method used:   Electronically to        [facility] [address] [facility] [address] * (retail)       [address]       [place], [place]  [address]       Ph: [phone]       Fax: [phone]   RxID:   [id number]

## 2010-07-14 NOTE — Letter (Signed)
Summary: Cardiac Catheterization Instructions- [facility]  [facility] [place]  [address], [place] [address]   Phone: [phone]  Fax: [phone]     02/19/2010 MRN: [id number]  [name] [address] [place], [place]  [address]  Dear [name],   You are scheduled for a Cardiac Catheterization on __9-12-11_______ with Dr.__Bensimhon____________  Please arrive to the 1st floor of the Heart and Vascular Center at [facility] at _8:00____ am on the day of your procedure. Please do not arrive before 6:30 a.m. Call the Heart and Vascular Center at [phone] if you are unable to make your appointmnet. The Code to get into the parking garage under the building is___0020_____. Take the elevators to the 1st floor. You must have someone to drive you home. Someone must be with you for the first 24 hours after you arrive home. Please wear clothes that are easy to get on and off and wear slip-on shoes. Do not eat or drink after midnight except water with your medications that morning. Bring all your medications and current insurance cards with you.  ___ DO NOT take these medications before your procedure: ________________________________________________________________  _X__ Make sure you take your aspirin.  ___ You may take ALL of your medications with water that morning. ________________________________________________________________________________________________________________________________  ___ DO NOT take ANY medications before your procedure.  ___ Pre-med instructions:  ________________________________________________________________________________________________________________________________  The usual length of stay after your procedure is 2 to 3 hours. This can vary.  If you have any questions, please call the office at the number listed above.   Larita Fife Via LPN

## 2010-07-14 NOTE — Assessment & Plan Note (Signed)
Summary: NPP/RECTAL BLEEDING.GU   Visit Type:  Initial Consult Referring Provider:  Lilyan [name] Primary Care Provider:  Lilyan [name]  Chief Complaint:  rectal bleeding.  History of Present Illness: 55 year old gentleman with multiple medical problems including advanced COPD and coronary artery disease seen in the emergency department last weekend with abdominal cramps followed by diarrhea followed by blood per rectum. He went up to [name]. Rectal exam there demonstrated Hemoccult positive stool but no gross blood per rectum. CBC revealed a white count of 15,100 H&H was 17.1 and 50.7.  He was prescribed Anusol suppositories and told to followup with me.  Additional laboratory data done in the ED earlier included Chem 20 which revealed a  mildly elevated ALT of 70; lipase is normal at 18 amylase was 38 total bilirubin 0.6 AST 26  Over the past couple days [name] tells me his abdominal cramps and blood per rectum have settled down; he is able to eat; he's been nauseated and had some belching but has not vomited; no fever;r he . No one else around him is sick. He continues to smoke.  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 I last performed colonoscopy in March 2010; he was found to have a left colon polyp which was removed turned out to be hyperplastic; he also underwent EGD for epigastric pain question short segment Barrett's. Biopsies were negative. He failed to keep his followup with me. According to the EMR records; he's lost only 1 pound since being seen by Dr. Jetty [name]  in November of last year. He denies odynophagia, dysphagia; he denies melena is not using any nonsteroidal agents.   Current Medications (verified): 1)  Lisinopril 20 Mg  Tabs (Lisinopril) .... Take 1 Tablet By Mouth Once A Day 2)  Sertraline Hcl 100 Mg  Tabs (Sertraline Hcl) .... Take 1 Tablet By Mouth Once A Day 3)  Vytorin 10-80 Mg  Tabs (Ezetimibe-Simvastatin) .Marland Kitchen.. 1 By  Mouth At Bedtime 4)  Adult Aspirin Ec Low Strength 81 Mg  Tbec (Aspirin) .Marland Kitchen.. 1 By Mouth Once Daily 5)  Symbicort 160-4.5 Mcg/act  Aero (Budesonide-Formoterol Fumarate) .... As Directed 6)  Albuterol Sulfate (2.5 Mg/17ml) 0.083%  Nebu (Albuterol Sulfate) .... As Needed 7)  Ventolin Hfa 108 (90 Base) Mcg/act Aers (Albuterol Sulfate) .... 2 Puffs Four Times A Day As Needed 8)  Cpap 10cm 9)  Hydrocodone-Acetaminophen 5-500 Mg  Tabs (Hydrocodone-Acetaminophen) .... Take One Tablet Eery 4 Hours As Needed Pain 10)  Alprazolam 0.5 Mg Tabs (Alprazolam) .... Take One Two Times A Day As Needed  Allergies (verified): No Known Drug Allergies  Past History:  Past Medical History: Last updated: 12/13/2008 Coronary Heart Disease- stent Cardiopulmonarystresstest 12/08- No clear cardiopulmonary limit, ? 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 Alcohol Use - yes - occasional Drug Use - no  Vital Signs:  Patient profile:   55  year old male Height:      71 inches Weight:      156 pounds BMI:      21.84 Temp:     97.8 degrees F oral Pulse rate:   80 / minute BP sitting:   102 / 70  (left arm) Cuff size:   regular  Vitals Entered By: [name] Spring LPN (June 27, 2009 9:11 AM)  Physical Exam  General:  pleasant 55 year old gentleman appear somewhat anxious but does not appear acutely ill or toxic Eyes:  no scleral icterus or conjunctival pain Lungs:  clear to auscultation Heart:  regular rate and rhythm without murmur gallop or a Abdomen:  nondistended positive bowel sounds entirely soft and nontender without appreciable mass or megaly Rectal:  rectal exam no external lesions good sphincter tone no mass rectal vault no stool in the rectal vault mucus is, however, Hemoccult positive.  Impression & Recommendations: Impression: Pleasant 55 year old gentleman with advanced COPD coronary artery disease now being seen for an acute illness  By abdominal cramps followed by diarrhea followed by blood per rectum. He did have a notable leukocytosis was seen in the ED last weekend. His symptoms have steadily improved.  In this setting, I suspect [name] has suffered a bout of ischemic or segmental colitis. I doubt this is an infectious process or other new onset inflammatory bowel disease, etc.  If this is ischemic colitis, is usually a self-limiting process. However, diagnoses  needs to be substantiated.  Recommendations: Diagnostic colonoscopy the first of next week. Risks, benefits, limitations, alternatives, reviewed with [name]. His questions been answered. He is agreeable.  In the interim,  stick with a low residue diet with smaller volume meals he is to maintain adequate fluid intake. He does have some hydrocodone APAP on hand at home from shoulder problem he's had the past. Advised him if needed, he could take one of these at bedtime to help him sleep but otherwise, used this agent sparingly in this setting. We'll also go ahead and repeat CBC today  I reemphasized the  importance of smoking cessation.  Other Orders: T-CBC w/Diff [phone])      Appended Document: Orders Update-charge    Clinical Lists Changes  Problems: Added new problem of ABDOMINAL PAIN, GENERALIZED (ICD-789.07) Orders: Added new Service order of Est. Patient Level IV (91478) - Signed Added new Service order of Hemoccult Guaiac-1 spec.(in office) (29562) - Signed

## 2010-07-14 NOTE — Miscellaneous (Signed)
Summary: LABS TSH,02/09/2010  Clinical Lists Changes  Observations: Added new observation of TSH: 1.011 microintl units/mL (02/09/2010 8:51)

## 2010-07-14 NOTE — Miscellaneous (Signed)
Summary: LABS CBCD,BMP,LIPIDS,LIVER,08/12/2009  Clinical Lists Changes  Observations: Added new observation of CALCIUM: 9.6 mg/dL (16/03/9603 54:09) Added new observation of ALBUMIN: 45 g/dL (81/19/1478 29:56) Added new observation of PROTEIN, TOT: 6.7 g/dL (21/30/8657 84:69) Added new observation of SGPT (ALT): 27 units/L (08/12/2009 10:26) Added new observation of SGOT (AST): 18 units/L (08/12/2009 10:26) Added new observation of ALK PHOS: 63 units/L (08/12/2009 10:26) Added new observation of BILI DIRECT: 0.1 mg/dL (62/95/2841 32:44) Added new observation of CREATININE: 0.66 mg/dL (06/16/7251 66:44) Added new observation of BUN: 12 mg/dL (03/47/4259 56:38) Added new observation of BG RANDOM: 98 mg/dL (75/64/3329 51:88) Added new observation of CO2 PLSM/SER: 26 meq/L (08/12/2009 10:26) Added new observation of CL SERUM: 104 meq/L (08/12/2009 10:26) Added new observation of K SERUM: 4.1 meq/L (08/12/2009 10:26) Added new observation of NA: 140 meq/L (08/12/2009 10:26) Added new observation of LDL: 84 mg/dL (41/66/0630 16:01) Added new observation of HDL: 36 mg/dL (09/32/3557 32:20) Added new observation of TRIGLYC TOT: 146 mg/dL (25/42/7062 37:62) Added new observation of CHOLESTEROL: 149 mg/dL (83/15/1761 60:73) Added new observation of PLATELETK/UL: 221 K/uL (08/12/2009 10:26) Added new observation of MCV: 90.0 fL (08/12/2009 10:26) Added new observation of HCT: 46.9 % (08/12/2009 10:26) Added new observation of HGB: 16.7 g/dL (71/11/2692 85:46) Added new observation of WBC COUNT: 9.5 10*3/microliter (08/12/2009 10:26)

## 2010-07-14 NOTE — Letter (Signed)
Summary: [place] [facility], agricultural at [facility]  [address], [place] [address]   Phone: [phone]  Fax: [phone]      February 23, 2010 MRN: [id number]   [name] [address] [place], [place]  [address]   Dear [name],  Your test ordered by Selena Batten has been reviewed by your physician (or physician assistant) and was found to be normal or stable. Your physician (or physician assistant) felt no changes were needed at this time.  ____ Echocardiogram  ____ Cardiac Stress Test  __x__ Lab Work  ____ Peripheral vascular study of arms, legs or neck  _x___ CT scan or X-ray  ____ Lung or Breathing test  ____ Other:  No  change at this time awaiting results of cardiac catherization, per [name]. Thank you, Tammy Allyne Gee RN    Islandia Bing, MD, Lenise Arena.C.Gaylord Shih, MD, F.A.C.C Lewayne Bunting, MD, F.A.C.C Nona Dell, MD, F.A.C.C Charlton Haws, MD, Lenise Arena.C.C

## 2010-07-14 NOTE — Letter (Signed)
Summary: Appointment - Missed  [facility] [place]  [address] [place] [address]   Phone: [phone]  Fax: [phone]     January 23, 2010 MRN: [id number]   HARDING THOMURE [address] [place], [place]  [address]   Dear Mr. Adolf,  Our records indicate you missed your appointment on          01/23/10              with Dr.       .       ROTHBART                             It is very important that we reach you to reschedule this appointment. We look forward to participating in your health care needs. Please contact us at the number listed above at your earliest convenience to reschedule this appointment.     Sincerely,    Glass blower/designer

## 2010-07-14 NOTE — Assessment & Plan Note (Signed)
Summary: per wifes call missed 01/23/2010 appt/sn  Medications Added GABAPENTIN 300 MG CAPS (GABAPENTIN) take 1 tab three times a day ANDROGEL PUMP 1.25 GM/ACT (1%) GEL (TESTOSTERONE) apply to thigh 4 times daily      Allergies Added: NKDA  Visit Type:  Follow-up Referring Provider:  . Primary Provider:  Dr. [name]   History of Present Illness: Mr. Huntley Knoop returns to the office after missing a recent appointment for continuing assessment of exertional dyspnea, chest tightness, chronic obstructive pulmonary disease and coronary artery disease.  He continues to smoke cigarettes, but has noted significant improvement in exercise tolerance, which remains quite limited.  He can walk perhaps 100 feet on flat ground without developing significant dyspnea.  During those episodes, he notes chest tightness as well.  Both respiratory problems and discomfort resolve promptly with rest.  Current Medications (verified): 1)  Lisinopril 20 Mg  Tabs (Lisinopril) .... Take 1 Tablet By Mouth Once A Day 2)  Sertraline Hcl 100 Mg  Tabs (Sertraline Hcl) .... Take 1 Tablet By Mouth Once A Day 3)  Adult Aspirin Ec Low Strength 81 Mg  Tbec (Aspirin) .Marland Kitchen.. 1 By Mouth Once Daily 4)  Albuterol Sulfate (2.5 Mg/78ml) 0.083%  Nebu (Albuterol Sulfate) .... As Needed 5)  Cpap 10cm 6)  Hydrocodone-Acetaminophen 5-500 Mg  Tabs (Hydrocodone-Acetaminophen) .... Take One Tablet Every  4 Hours As Needed Pain 7)  Alprazolam 0.5 Mg Tabs (Alprazolam) .... Take One Two Times A Day As Needed 8)  Vytorin 10-80 Mg Tabs (Ezetimibe-Simvastatin) .... Take 1 Tab Daily 9)  Combivent 18-103 Mcg/act Aero (Ipratropium-Albuterol) .... 2 Puffs Three Times A Day 10)  Symbicort 80-4.5 Mcg/act Aero (Budesonide-Formoterol Fumarate) .Marland Kitchen.. 1 Puff Two Times A Day 11)  Gabapentin 300 Mg Caps (Gabapentin) .... Take 1 Tab Three Times A Day 12)  Androgel Pump 1.25 Gm/act (1%) Gel (Testosterone) .... Apply To Thigh 4 Times Daily  Allergies  (verified): No Known Drug Allergies  Past History:  Past Medical History: Last updated: 12/22/2009 ASCVD: Inferior MI with circumflex PTCA in 1993; cath in 2000-50% OM1&2, 30% LAD; 2008-25% LAD,       80% nondominant right, 60% small OM 2, EF of 55% with severe basilar inferior hypokinesis;       stress nuclear study in 11/09-moderate inferior scar with peri-infarction ischemia; EF of 46 ______________________________________________________ SYNCOPE HYPERCHOLESTEROLEMIA (ICD-272.0) HYPERTENSION (ICD-401.9) Tobacco Abuse-50 pack years continuing at one half pack per day COPD-mild; exercise-induced hypoxemia by CP stress test; ASTHMA (ICD-493.90); BRONCHITIS (ICD-490) OBSTRUCTIVE SLEEP APNEA (ICD-327.23)  AHI 7/hr Restless leg syndrome ANXIETY (ICD-300.00) / DEPRESSION (ICD-311) PROBABLE GERD (ICD-530.81) Rectal polyp with carcinoma in situ removed via colonoscope Lower GI bleed SOMNOLENCE-EXCESSIVE DAYTIME (ICD-780.09)  Past Surgical History: Last updated: 12/22/2009 Surgical intervention for injury of the fingers of the left hand many years ago Laparoscopic cholecystectomy-2004 Colonoscopy; EGD in 2009-surveillance for a history of colonic polypectomy  Family History: Last updated: 11/01/2007 Coronary Heart Disease Mother clotting disorder brother 67 hypertension brother 72 hypertension  Social History: Last updated: 12/13/2008 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  Review of Systems       He denies significant chronic cough or sputum production.  Wheezing has improved.  Symptoms resolved promptly with rest.  He does not believe that he is much better than at his previous office visit.  Vital Signs:  Patient profile:  55 year old male Weight:      153 pounds BMI:     21.42 Pulse rate:   77 / minute BP sitting:   123 / 84  (right  arm)  Vitals Entered By: Dreama Saa, CNA (February 19, 2010 3:03 PM)  Physical Exam  General:    Proportionate height and weight; well developed; no acute distress; mildly disheveled   Neck-No JVD; no carotid bruits: Lungs-No rales, rhonchi nor wheezes; mildly prolonged expiratory phase Cardiovascular-normal PMI; normal S1 and S2;  S4 present. Abdomen-BS normal; soft and non-tender without masses or organomegaly:  Musculoskeletal-No deformities, no cyanosis or clubbing: Neurologic-Normal cranial nerves; symmetric strength and tone:  Skin-Warm, no significant lesions; multiple tattoos Extremities-intact distal pulses; no edema:     Impression & Recommendations:  Problem # 1:  CORONARY ATHEROSCLEROSIS NATIVE CORONARY ARTERY (ICD-414.01) Stress nuclear study showed the previously identified inferior infarction, but there was also subtle reversibility of a mild lesion in the anterior wall.  This compares to a previous test from 2008 at which time only inferior abnormalities were noted.  In this patient with known coronary artery disease, symptoms consistent with exertional angina and no improvement in symptoms despite better control of chronic obstructive pulmonary disease, cardiac catheterization is warranted to exclude progression of CAD.  His most recent study in 2008 showed no critical lesions despite symptoms that were interpreted as exertional angina.  Hopefully, that will be the case for this test as well.  I will reassess Mr.; Dresser once his study has been completed.  Pre-catheterization laboratories and chest x-ray are pending.  Other Orders: T-Basic Metabolic Panel [id number]) T-CBC w/Diff [phone]) T-Protime, Auto [phone]) T-PTT [phone]) T-Chest x-ray, 2 views ([id number]) Cardiac Catheterization (Cardiac Cath)  Patient Instructions: 1)  Your physician recommends that you schedule a follow-up appointment in: post cath 2)  Your physician recommends that you  return for lab work UX:NATFT 3)  A chest x-ray takes a picture of the organs and structures inside the chest, including the heart, lungs, and blood vessels. This test can show several things, including, whether the heart is enlarged; whether fluid is building up in the lungs; and whether pacemaker / defibrillator leads are still in place. 4)  Your physician has requested that you have a cardiac catheterization.  Cardiac catheterization is used to diagnose and/or treat various heart conditions. Doctors may recommend this procedure for a number of different reasons. The most common reason is to evaluate chest pain. Chest pain can be a symptom of coronary artery disease (CAD), and cardiac catheterization can show whether plaque is narrowing or blocking your heart's arteries. This procedure is also used to evaluate the valves, as well as measure the blood flow and oxygen levels in different parts of your heart.  For further information please visit [email or web address]  Please follow instruction sheet, as given.

## 2010-07-14 NOTE — Miscellaneous (Signed)
Summary: LABS CBCD,12/31/2009  Clinical Lists Changes  Observations: Added new observation of ABSOLUTE BAS: 0.0 K/uL (12/31/2009 11:47) Added new observation of BASOPHIL %: 0 % (12/31/2009 11:47) Added new observation of EOS ABSLT: 0.1 K/uL (12/31/2009 11:47) Added new observation of % EOS AUTO: 1 % (12/31/2009 11:47) Added new observation of ABSOLUTE MON: 0.4 K/uL (12/31/2009 11:47) Added new observation of MONOCYTE %: 5 % (12/31/2009 11:47) Added new observation of ABS LYMPHOCY: 1.8 K/uL (12/31/2009 11:47) Added new observation of LYMPHS %: 20 % (12/31/2009 11:47) Added new observation of PLATELETK/UL: 195 K/uL (12/31/2009 11:47) Added new observation of RDW: 13.9 % (12/31/2009 11:47) Added new observation of MCHC RBC: 31.9 g/dL (30/86/5784 69:62) Added new observation of MCV: 92.3 fL (12/31/2009 11:47) Added new observation of HCT: 46.6 % (12/31/2009 11:47) Added new observation of HGB: 16.1 g/dL (95/28/4132 44:01) Added new observation of RBC M/UL: 5.05 M/uL (12/31/2009 11:47) Added new observation of WBC COUNT: 8.9 10*3/microliter (12/31/2009 11:47)

## 2010-07-14 NOTE — Progress Notes (Signed)
Summary: Pulmonary Referral   Phone Note Call from Patient   Caller: Spouse Reason for Call: Talk to Nurse Summary of Call: pt's wife states that pt was told at last Office Visit that he needed referral to Pulmonary physician. There is no mention of this in note from LOV. Please check on this and let patient know. If so patient would like to see Dr.Henderson in [place]./tg Initial call taken by: Raechel Ache [facility],  December 29, 2009 3:46 PM  Follow-up for Phone Call        Please set up referral for pulmonary consult with Dr. Juanetta Gosling per pt.   Problem # 4:  COPD (ICD-496) Chronic lung disease appears to be the patient's major problem.  This could account for exertional dyspnea and chest tightness.  I explained to him that albuterol is to be used on a rescue basis and that Symbicort is to be used continuously, an approach that he has not been utilizing.  He has had Spiriva in the past without benefit.  Combivent will be added to his medical regime with reassessment in one month. Follow-up by: Teressa Lower RN,  December 30, 2009 2:10 PM  Additional Follow-up for Phone Call Additional follow up Details #1::        appt. made with Dr.Henderson in [place] for 01/23/10 @ 1:00. Called patient to give appt. and LMOM/tg  Additional Follow-up by: Raechel Ache [facility],  January 01, 2010 1:13 PM    Additional Follow-up for Phone Call Additional follow up Details #2::    pt aware of appt. /tg Follow-up by: Raechel Ache [facility],  January 01, 2010 2:45 PM
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
## 2010-07-14 NOTE — Letter (Signed)
Summary: [facility]  [facility]   Imported By: Diana Eves 07/03/2009 09:20:47  _____________________________________________________________________  External Attachment:    Type:   Image     Comment:   External Document

## 2010-07-15 ENCOUNTER — Ambulatory Visit (HOSPITAL_COMMUNITY): Admit: 2010-07-15 | Payer: Self-pay | Admitting: Psychology

## 2010-07-15 ENCOUNTER — Encounter (HOSPITAL_COMMUNITY): Payer: Self-pay | Admitting: Psychology

## 2010-07-17 ENCOUNTER — Ambulatory Visit (HOSPITAL_COMMUNITY): Payer: Self-pay | Admitting: Psychiatry

## 2010-07-17 ENCOUNTER — Encounter (HOSPITAL_COMMUNITY): Payer: Self-pay | Admitting: Psychiatry

## 2010-07-17 ENCOUNTER — Ambulatory Visit (HOSPITAL_COMMUNITY): Admit: 2010-07-17 | Payer: Self-pay | Admitting: Psychiatry

## 2010-07-22 ENCOUNTER — Encounter (INDEPENDENT_AMBULATORY_CARE_PROVIDER_SITE_OTHER): Payer: BC Managed Care – PPO | Admitting: Psychology

## 2010-07-22 DIAGNOSIS — F411 Generalized anxiety disorder: Secondary | ICD-10-CM

## 2010-07-22 DIAGNOSIS — F332 Major depressive disorder, recurrent severe without psychotic features: Secondary | ICD-10-CM

## 2010-08-04 ENCOUNTER — Encounter (INDEPENDENT_AMBULATORY_CARE_PROVIDER_SITE_OTHER): Payer: BC Managed Care – PPO | Admitting: Psychology

## 2010-08-04 DIAGNOSIS — F332 Major depressive disorder, recurrent severe without psychotic features: Secondary | ICD-10-CM

## 2010-08-04 DIAGNOSIS — F411 Generalized anxiety disorder: Secondary | ICD-10-CM

## 2010-08-14 ENCOUNTER — Ambulatory Visit (INDEPENDENT_AMBULATORY_CARE_PROVIDER_SITE_OTHER): Payer: BC Managed Care – PPO | Admitting: Psychiatry

## 2010-08-14 DIAGNOSIS — F331 Major depressive disorder, recurrent, moderate: Secondary | ICD-10-CM

## 2010-08-18 ENCOUNTER — Encounter (INDEPENDENT_AMBULATORY_CARE_PROVIDER_SITE_OTHER): Payer: BC Managed Care – PPO | Admitting: Psychology

## 2010-08-18 DIAGNOSIS — F332 Major depressive disorder, recurrent severe without psychotic features: Secondary | ICD-10-CM

## 2010-08-18 DIAGNOSIS — F411 Generalized anxiety disorder: Secondary | ICD-10-CM

## 2010-08-24 LAB — BASIC METABOLIC PANEL
BUN: 7 mg/dL (ref 6–23)
CO2: 24 mEq/L (ref 19–32)
Calcium: 8.9 mg/dL (ref 8.4–10.5)
Chloride: 106 mEq/L (ref 96–112)
Creatinine, Ser: 0.78 mg/dL (ref 0.4–1.5)
GFR calc Af Amer: >60 mL/min (ref >60)
GFR calc non Af Amer: >60 mL/min (ref >60)
Glucose, Bld: 78 mg/dL (ref 70–99)
Potassium: 3.7 mEq/L (ref 3.5–5.1)
Sodium: 140 mEq/L (ref 135–145)

## 2010-08-24 LAB — DIFFERENTIAL
Basophils Absolute: 0 10*3/uL (ref 0.0–0.1)
Basophils Relative: 0 % (ref 0–1)
Eosinophils Absolute: 0.1 10*3/uL (ref 0.0–0.7)
Eosinophils Relative: 1 % (ref 0–5)
Lymphocytes Relative: 32 % (ref 12–46)
Lymphs Abs: 2.3 10*3/uL (ref 0.7–4.0)
Monocytes Absolute: 0.3 10*3/uL (ref 0.1–1.0)
Monocytes Relative: 5 % (ref 3–12)
Neutro Abs: 4.6 10*3/uL (ref 1.7–7.7)
Neutrophils Relative %: 63 % (ref 43–77)

## 2010-08-24 LAB — POCT CARDIAC MARKERS
CKMB, poc: <1 ng/mL — ABNORMAL LOW (ref 1.0–8.0)
CKMB, poc: <1 ng/mL — ABNORMAL LOW (ref 1.0–8.0)
Myoglobin, poc: 49.2 ng/mL (ref 12–200)
Myoglobin, poc: 51 ng/mL (ref 12–200)
Troponin i, poc: <0.05 ng/mL (ref 0.00–0.09)
Troponin i, poc: <0.05 ng/mL (ref 0.00–0.09)

## 2010-08-24 LAB — CBC
HCT: 41.1 % (ref 39.0–52.0)
Hemoglobin: 14.7 g/dL (ref 13.0–17.0)
MCH: 31.4 pg (ref 26.0–34.0)
MCHC: 35.8 g/dL (ref 30.0–36.0)
MCV: 87.8 fL (ref 78.0–100.0)
Platelets: 158 10*3/uL (ref 150–400)
RBC: 4.68 MIL/uL (ref 4.22–5.81)
RDW: 13.1 % (ref 11.5–15.5)
WBC: 7.3 10*3/uL (ref 4.0–10.5)

## 2010-08-28 ENCOUNTER — Ambulatory Visit (INDEPENDENT_AMBULATORY_CARE_PROVIDER_SITE_OTHER): Payer: BC Managed Care – PPO | Admitting: Cardiology

## 2010-08-28 ENCOUNTER — Encounter: Payer: Self-pay | Admitting: Cardiology

## 2010-08-28 DIAGNOSIS — E782 Mixed hyperlipidemia: Secondary | ICD-10-CM

## 2010-08-28 DIAGNOSIS — I2510 Atherosclerotic heart disease of native coronary artery without angina pectoris: Secondary | ICD-10-CM

## 2010-08-28 DIAGNOSIS — I10 Essential (primary) hypertension: Secondary | ICD-10-CM

## 2010-08-28 DIAGNOSIS — IMO0001 Reserved for inherently not codable concepts without codable children: Secondary | ICD-10-CM | POA: Insufficient documentation

## 2010-09-02 ENCOUNTER — Ambulatory Visit (HOSPITAL_COMMUNITY)
Admission: RE | Admit: 2010-09-02 | Discharge: 2010-09-02 | Disposition: A | Discharge status: Home / Self Care | Payer: BC Managed Care – PPO | Source: Ambulatory Visit | Attending: Cardiology | Admitting: Cardiology

## 2010-09-02 DIAGNOSIS — R0989 Other specified symptoms and signs involving the circulatory and respiratory systems: Secondary | ICD-10-CM | POA: Insufficient documentation

## 2010-09-02 DIAGNOSIS — R0609 Other forms of dyspnea: Secondary | ICD-10-CM | POA: Insufficient documentation

## 2010-09-02 LAB — BLOOD GAS, ARTERIAL
Patient temperature: 37
pH, Arterial: 7.405 (ref 7.350–7.450)
pO2, Arterial: 78.8 mmHg — ABNORMAL LOW (ref 80.0–100.0)

## 2010-09-09 ENCOUNTER — Encounter (INDEPENDENT_AMBULATORY_CARE_PROVIDER_SITE_OTHER): Payer: BC Managed Care – PPO | Admitting: Psychology

## 2010-09-09 ENCOUNTER — Other Ambulatory Visit: Payer: Self-pay | Admitting: Cardiology

## 2010-09-09 DIAGNOSIS — F411 Generalized anxiety disorder: Secondary | ICD-10-CM

## 2010-09-09 DIAGNOSIS — F332 Major depressive disorder, recurrent severe without psychotic features: Secondary | ICD-10-CM

## 2010-09-10 NOTE — Assessment & Plan Note (Signed)
Summary: FOLLOW UP - 6 MONTHS  Medications Added VYTORIN 10-80 MG TABS (EZETIMIBE-SIMVASTATIN) HOLD  take 1 tab daily METOPROLOL SUCCINATE 25 MG XR24H-TAB (METOPROLOL SUCCINATE) Take one tablet by mouth daily AMLODIPINE BESYLATE 5 MG TABS (AMLODIPINE BESYLATE) Take one tablet by mouth daily      Allergies Added: NKDA  Visit Type:  Follow-up Referring Provider:  . Primary Provider:  Dr. [name]   History of Present Illness: [name] returns to the office for continued assessment and treatment of coronary disease and probable chronic obstructive pulmonary disease.  Since his last visit, he is unchanged.  He was unable to tolerate isosorbide mononitrate 30 mg q.d. after 30 days of trying due to a virtually continuous headache.  He continues to experience dyspnea and chest tightness with mild exertion.  Lifestyle is sedentary, and, as a result, he is rarely symptomatic.  He has not used nitroglycerin for some time.  He describes additional problems including generalized fatigue, exercise intolerance and discomfort in his legs, particularly the anterior thighs.  While the symptoms are exacerbated by exercise, this is not a classic history for claudication.  Current Medications (verified): 1)  Lisinopril 20 Mg  Tabs (Lisinopril) .... Take 1 Tablet By Mouth Once A Day 2)  Sertraline Hcl 100 Mg  Tabs (Sertraline Hcl) .... Take 1 Tablet By Mouth Once A Day 3)  Adult Aspirin Ec Low Strength 81 Mg  Tbec (Aspirin) .Marland Kitchen.. 1 By Mouth Once Daily 4)  Albuterol Sulfate (2.5 Mg/17ml) 0.083%  Nebu (Albuterol Sulfate) .... As Needed 5)  Cpap 10cm 6)  Hydrocodone-Acetaminophen 5-500 Mg  Tabs (Hydrocodone-Acetaminophen) .... Take One Tablet Every  4 Hours As Needed Pain 7)  Alprazolam 0.5 Mg Tabs (Alprazolam) .... Take One Two Times A Day As Needed 8)  Vytorin 10-80 Mg Tabs (Ezetimibe-Simvastatin) .... Hold  Take 1 Tab Daily 9)  Combivent 18-103 Mcg/act Aero (Ipratropium-Albuterol) .... 2 Puffs  Three Times A Day 10)  Symbicort 80-4.5 Mcg/act Aero (Budesonide-Formoterol Fumarate) .Marland Kitchen.. 1 Puff Two Times A Day 11)  Gabapentin 300 Mg Caps (Gabapentin) .... Take 1 Tab Three Times A Day 12)  Androgel Pump 1.25 Gm/act (1%) Gel (Testosterone) .... Apply To Thigh 4 Times Daily 13)  Metoprolol Succinate 25 Mg Xr24h-Tab (Metoprolol Succinate) .... Take One Tablet By Mouth Daily 14)  Amlodipine Besylate 5 Mg Tabs (Amlodipine Besylate) .... Take One Tablet By Mouth Daily  Allergies (verified): No Known Drug Allergies  Comments:  Nurse/Medical Assistant: no meds no list we reviewed meds from previous ov told patient to bring meds from here on out does he will have to reschedule appointment  Past History:  PMH, FH, and Social History reviewed and updated.  Review of Systems  The patient denies anorexia, fever, weight loss, weight gain, hoarseness, syncope, peripheral edema, prolonged cough, headaches, and abdominal pain.    Vital Signs:  Patient profile:   55 year old male Weight:      155 pounds BMI:     21.70 O2 Sat:      93 % on Room air Pulse rate:   65 / minute BP sitting:   124 / 76  (left arm)  Vitals Entered By: Dreama Saa, CNA (August 28, 2010 1:04 PM)  O2 Flow:  Room air  Physical Exam  General:  Proportionate height and weight; well developed; no acute distress Neck-No JVD; no carotid bruits: Lungs-No rales, rhonchi nor wheezes; mildly prolonged expiratory phase Cardiovascular-normal PMI; normal S1 and S2;  S4 present. Abdomen-BS  normal; soft and non-tender without masses or organomegaly:  Musculoskeletal-No deformities, no cyanosis or clubbing: Neurologic-Normal cranial nerves; symmetric strength and tone:  Skin-Warm, no significant lesions; multiple tattoos Extremities-normal posterior tibial pulses; 1+ dorsalis pedis pulses; no edema   Impression & Recommendations:  Problem # 1:  ATHEROSCLEROTIC CARDIOVASCULAR DISEASE (ICD-429.2) It is unclear whether  symptoms are related to pulmonary disease or cardiac disease.  In any case, the patient is receiving no effective antianginal medication.  In the absence of significant bronchospasm, we will start metoprolol 25 mg q.d. and amlodipine 5 mg q.d.  He will return in one month for reassessment of symptoms.  Ultimately, a stress test may be useful, but disease is restricted to a few small vessels, and a minor degree of ischemia may not be apparent on imaging.  Problem # 2:  TOBACCO ABUSE (ICD-305.1) Patient almost certainly has significant chronic obstructive pulmonary disease.  He recalls PFTs being performed in the distant past.  We will obtain an updated set and decide whether additional medication for chronic obstructive pulmonary disease as warranted.  Once again, the importance of discontinuing tobacco use was stressed.  Patient has stopped for 6 months in the distant past.  He was encouraged to do so again, and pharmacologic assistance was offered.  Problem # 3:  MYALGIA (ICD-729.1) The potential etiologies for patient's myalgias and fatigue are numerous.  My initial approach will be to discontinue lipid-lowering therapy and reassess symptoms in one month.  Other Orders: Pulmonary Function Test (PFT) Future Orders: T-Lipid Profile ([id number]) ... 08/31/2010 T- * Misc. Laboratory test [id number]) ... 08/31/2010  Patient Instructions: 1)  Your physician recommends that you schedule a follow-up appointment in: 1 MONTH 2)  Your physician has recommended that you have a pulmonary function test.  Pulmonary Function Tests are a group of tests that measure how well air moves in and out of your lungs. 3)  Your physician has recommended you make the following change in your medication: STOP IMDUR HOLD VYTORN, START METOPROLOL SUCCINATE 25MG  DAILY. AMLODIPINE 5MG  DAILY Prescriptions: AMLODIPINE BESYLATE 5 MG TABS (AMLODIPINE BESYLATE) Take one tablet by mouth daily  #30 x 3   Entered by:   Teressa Lower RN    Authorized by:   Kathlen Brunswick, MD, [facility]   Signed by:   Teressa Lower RN on 08/28/2010   Method used:   Electronically to        [facility]       [address] [place]  [address]       Ph: [phone]       Fax: [phone]   RxID:   [id number] METOPROLOL SUCCINATE 25 MG XR24H-TAB (METOPROLOL SUCCINATE) Take one tablet by mouth daily  #30 x 3   Entered by:   Teressa Lower RN   Authorized by:   Kathlen Brunswick, MD, [facility]   Signed by:   Teressa Lower RN on 08/28/2010   Method used:   Electronically to        [facility]       [address] [place]  [address]       Ph: [phone]       Fax: [phone]   RxID:   [id number]

## 2010-09-11 ENCOUNTER — Encounter (INDEPENDENT_AMBULATORY_CARE_PROVIDER_SITE_OTHER): Payer: BC Managed Care – PPO | Admitting: Psychiatry

## 2010-09-11 DIAGNOSIS — F331 Major depressive disorder, recurrent, moderate: Secondary | ICD-10-CM

## 2010-09-23 ENCOUNTER — Encounter (INDEPENDENT_AMBULATORY_CARE_PROVIDER_SITE_OTHER): Payer: BC Managed Care – PPO | Admitting: Psychology

## 2010-09-23 DIAGNOSIS — F332 Major depressive disorder, recurrent severe without psychotic features: Secondary | ICD-10-CM

## 2010-09-23 DIAGNOSIS — F411 Generalized anxiety disorder: Secondary | ICD-10-CM

## 2010-09-28 ENCOUNTER — Encounter: Payer: Self-pay | Admitting: *Deleted

## 2010-09-28 ENCOUNTER — Ambulatory Visit (INDEPENDENT_AMBULATORY_CARE_PROVIDER_SITE_OTHER): Payer: BC Managed Care – PPO | Admitting: Cardiology

## 2010-09-28 ENCOUNTER — Encounter: Payer: Self-pay | Admitting: Cardiology

## 2010-09-28 VITALS — BP 112/69 | HR 78 | Ht 70.0 in | Wt 157.0 lb

## 2010-09-28 DIAGNOSIS — I2510 Atherosclerotic heart disease of native coronary artery without angina pectoris: Secondary | ICD-10-CM

## 2010-09-28 DIAGNOSIS — F17200 Nicotine dependence, unspecified, uncomplicated: Secondary | ICD-10-CM

## 2010-09-28 DIAGNOSIS — E785 Hyperlipidemia, unspecified: Secondary | ICD-10-CM

## 2010-09-28 DIAGNOSIS — I10 Essential (primary) hypertension: Secondary | ICD-10-CM

## 2010-09-28 MED ORDER — PRAVASTATIN SODIUM 40 MG PO TABS: 40.0000 mg | ORAL_TABLET | Freq: Every day | ORAL | Status: DC

## 2010-09-28 NOTE — Patient Instructions (Signed)
Your physician recommends that you schedule a follow-up appointment in:7 months Your physician has recommended you make the following change in your medication:pravastatin 40mg  daily Your physician recommends that you return for lab work in:1 monht

## 2010-09-28 NOTE — Assessment & Plan Note (Addendum)
Patient agrees to a major quit attempt.  She has taken Chantix in the past without benefit, and will try to stop smoking without pharmacologic assistance.  Should that prove futile, she will purchase nicotine patches over-the-counter to assist with continuing attempts at abstinence.

## 2010-09-28 NOTE — Assessment & Plan Note (Addendum)
No symptoms have developed to suggest progression of coronary artery disease.  Our focus will remain on optimal control of risk factors.

## 2010-09-28 NOTE — Assessment & Plan Note (Signed)
Most recent lipid profile was good, but therapy has been changed.  A repeat lipid profile will be obtained in one month.

## 2010-09-28 NOTE — Assessment & Plan Note (Signed)
Blood pressure control is good to excellent; current medication will be continued.

## 2010-09-28 NOTE — Progress Notes (Signed)
HPI : [name] returns to the office as scheduled for continued assessment and treatment of coronary disease and chronic obstructive pulmonary disease.PFT results were somewhat difficult to interpret with obstructive disease described as mild but with "pretty severe" airflow obstruction.  Blood gas on room air was essentially normal.  Patient has improved since his previous visit, but continues to have class III dyspnea on exertion and myalgias in his legs when he walks as well as discomfort in his shoulders and neck.  There has been no apparent improvement with discontinuation of Vytorin.  Current Outpatient Prescriptions on File Prior to Visit  Medication Sig Dispense Refill  . albuterol (PROVENTIL) (2.5 MG/3ML) 0.083% nebulizer solution Take 2.5 mg by nebulization every 6 (six) hours as needed.        Marland Kitchen albuterol-ipratropium (COMBIVENT) 18-103 MCG/ACT inhaler Inhale 2 puffs into the lungs every 6 (six) hours as needed.        Marland Kitchen amLODipine (NORVASC) 5 MG tablet Take 5 mg by mouth daily.        Marland Kitchen aspirin 81 MG tablet Take 81 mg by mouth daily.        . budesonide-formoterol (SYMBICORT) 80-4.5 MCG/ACT inhaler Inhale 2 puffs into the lungs 2 (two) times daily.        Marland Kitchen gabapentin (NEURONTIN) 300 MG capsule Take 300 mg by mouth 2 (two) times daily.       Marland Kitchen HYDROcodone-acetaminophen (VICODIN) 5-500 MG per tablet Take 1 tablet by mouth every 6 (six) hours as needed.        Marland Kitchen lisinopril (PRINIVIL,ZESTRIL) 20 MG tablet Take 20 mg by mouth daily.        . metoprolol succinate (TOPROL-XL) 25 MG 24 hr tablet Take 25 mg by mouth daily.        . Testosterone (ANDROGEL PUMP) 1.25 GM/ACT (1%) GEL Place onto the skin 4 (four) times daily.        Marland Kitchen DISCONTD: ALPRAZolam (XANAX) 0.5 MG tablet Take 1 mg by mouth at bedtime.       Marland Kitchen DISCONTD: ezetimibe-simvastatin (VYTORIN) 10-80 MG per tablet Take 1 tablet by mouth at bedtime.        Marland Kitchen DISCONTD: sertraline (ZOLOFT) 100 MG tablet Take 100 mg by mouth daily.           No Known Allergies    Past medical history, social history, and family history reviewed and updated.  ROS:  No exacerbation of chronic lung disease; no wheezing or sputum production; no orthopnea, PND, lightheadedness or syncope.  All other systems reviewed and are negative.  PHYSICAL EXAM: BP 112/69  Pulse 78  Ht 5\' 10"  (1.778 m)  Wt 157 lb (71.215 kg)  BMI 22.53 kg/m2  SpO2 97%  General-Well developed; no acute distress Body habitus-proportionate weight and height Neck-No JVD; no carotid bruits Lungs-clear lung fields; resonant to percussion; modest prolongation of the expiratory phase Cardiovascular-normal PMI; normal S1 and S2; prominent fourth heart sound Abdomen-normal bowel sounds; soft and non-tender without masses or organomegaly Musculoskeletal-No deformities, no cyanosis or clubbing Neurologic-Normal cranial nerves; symmetric strength and tone Skin-Warm, no significant lesions Extremities-normal posterior tibial pulses bilaterally; decreased dorsalis pedis; no edema  ASSESSMENT AND PLAN:

## 2010-10-09 ENCOUNTER — Encounter: Payer: Self-pay | Admitting: Cardiology

## 2010-10-14 ENCOUNTER — Encounter (HOSPITAL_COMMUNITY): Payer: BC Managed Care – PPO | Admitting: Psychology

## 2010-10-16 ENCOUNTER — Encounter: Payer: Self-pay | Admitting: Cardiology

## 2010-10-27 NOTE — Op Note (Signed)
[name], [name]                ACCOUNT NO.:  [id number]   MEDICAL RECORD NO.:  [id number]          PATIENT TYPE:  AMB   LOCATION:  DAY                           FACILITY:  [facility]   PHYSICIAN:  R. Roetta Sessions, M.D. DATE OF BIRTH:  August 26, 1955   DATE OF PROCEDURE:  DATE OF DISCHARGE:                               OPERATIVE REPORT   Esophagogastroduodenoscopy with biopsy followed by surveillance  colonoscopy.   INDICATIONS FOR PROCEDURE:  A 53-year gentleman with a history of  colonic polyps.  A rectal polyp containing carcinoma in situ back in  2004, a negative colonoscopy in 2005 except for  one tiny polyp.  He has  some vague epigastric pain, early satiety as well recently.  EGD and  colonoscopy are now being done.  The risks, benefits, alternatives and  limitations have been discussed, questions answered.  The patient is  agreeable.  Please see the documentation in the medical record.   PROCEDURE NOTE:  O2 saturation, blood pressure, pulse and respirations  monitored throughout the entire procedure.  Conscious sedation: Versed 6  mg IV and Demerol 125 mg IV in divided doses.  Instrument:  Pentax video  chip system.  Cetacaine spray for topical pharyngeal anesthesia.   FINDINGS:  EGD examination of the tubular esophagus revealed salmon-  colored epithelium coming up 1-2 cm above where the EG junction appeared  to be located.  This could be accentuating undulating Z-line, but I  might be somewhat concerned about short segment Barrett's esophagus.  There was some very minimal leading edge inflammatory changes.  There  was no evidence of a stricture, web ring, or neoplasm.  The EG junction  easily traversed into the stomach.  The gastric cavity was emptied and  insufflated well with air.  Thorough examination of the gastric mucosa  including retroflexion of the proximal stomach, esophagogastric junction  demonstrated no abnormalities.  The pylorus was patent and easily  traversed.   Examination of the bulb and second portion revealed no  abnormalities.  Therapeutic/diagnostic maneuvers performed.  Biopsies of  the leading edge salmon-colored epithelium were taken for histologic  study.  The patient tolerated the procedure well.   Colonoscopy:  Digital rectal exam revealed no abnormalities.  His prep  was adequate.  Colon:  The colonic mucosa was surveyed from the  rectosigmoid junction through the left transverse to the right colon,  appendiceal orifice, ileocecal valve and cecum.  These structures well  seen and photographed  for the record.  From this level scope was slowly  cautiously withdrawn.  All previously mentioned mucosal surfaces were  again seen.   The patient was noted to have a 6 mm pedunculated polyp in the mid  descending colon which was hot snared and recovered with the scope.  There was a single diminutive polyp in the mid sigmoid which was ablated  with the tip of the hot snare cautery unit.  The remainder of the  colonic mucosa appeared unremarkable.  The scope was pulled down into  the rectum for thorough examination of the rectal mucosa including  retroflexed view of  the anal verge demonstrated only internal  hemorrhoids.  The patient tolerated both procedures well and was  reactive after endoscopy.  Cecal withdrawal time was 7 minutes.   IMPRESSION:  Accentuated undulating Z-line, query short segment  Barrett's status post biopsy.  Otherwise, normal esophagus, stomach,  duodenum one, duodenum two.  Colonoscopy findings:  Internal  hemorrhoids, otherwise normal rectum.  Polyps in the sigmoid and  descending segments ablated and removed, respectfully.  The remainder of  the colonic  mucosa appeared normal.   RECOMMENDATIONS:  1. Begin a course of Kapidex 60 mg orally daily for 1 month.  The      patient is to go by my office for a prescription.  2. Follow-up on path.  3. Appointment to see [place] back in the office in 1 month.      [name], M.D.  Electronically Signed     RMR/MEDQ  D:  08/27/2008  T:  08/27/2008  Job:  [id number]   cc:   [name] A. Gerda Diss, MD  Fax: [phone]

## 2010-10-27 NOTE — Letter (Signed)
May 02, 2008    [name], MD  [address]  [place], [place] [address]   RE:  TRUXTON, STUPKA  MRN:  [id number]  /  DOB:  12/16/1955   Dear [name],   Mr. Cartlidge returns to the office for continuing evaluation of chest  discomfort.  He has had no further symptoms.  He continues to note  fatigue.  He continues to smoke cigarettes, albeit at a lesser rate than  in the past.   CBC, metabolic profile, and TSH level were entirely normal.  He had  pulmonary function tests in November 2008 at our [facility] that  showed only minor obstructive disease.  His stress nuclear study was  generally good.  He had excellent exercise tolerance, achieving a  workload of 13 METS.  His stress EKG was slightly abnormal, but non-  diagnostic.  His stress images showed an inferior infarction with peri-  infarction ischemia.   PHYSICAL EXAMINATION:  GENERAL:  Trim pleasant gentleman in no acute  distress.  VITAL SIGNS:  The weight is 155, unchanged.  Blood pressure 110/80,  heart rate 70 and regular, and respirations 14.  NECK:  No jugular venous distention; no carotid bruits.  LUNGS:  Clear.  CARDIAC:  Normal first and second heart sounds.  ABDOMEN:  Soft and nontender; no organomegaly.   IMPRESSION:  Mr. Highley experienced a worrisome episode of chest  discomfort, but nothing since.  He has excellent exercise tolerance  without diagnostic EKG changes and without reproduction of chest  discomfort.  Accordingly, I would characterize this is a low risk study  without the need for immediate angiography.  If he experiences recurrent  episodes consistent with angina, repeating coronary angiography would be  appropriate.  I have asked them to call if that happens.  We will make  sure he has nitroglycerin, in case he experiences recurrent chest  discomfort.  Otherwise, I will see him in 4 months.    Sincerely,      Gerrit Friends. Dietrich Pates, MD, [facility]  Electronically Signed    RMR/MedQ  DD: 05/02/2008  DT: 05/03/2008  Job #: [id number]

## 2010-10-27 NOTE — Cardiovascular Report (Signed)
[name], [name]                ACCOUNT NO.:  [id number]   MEDICAL RECORD NO.:  [id number]          PATIENT TYPE:  OIB   LOCATION:  1963                         FACILITY:  [facility]   PHYSICIAN:  Rollene Rotunda, MD, FACCDATE OF BIRTH:  [birth year]   DATE OF PROCEDURE:  DATE OF DISCHARGE:                            CARDIAC CATHETERIZATION   PRIMARY CARE PHYSICIAN:  Dr. [name].   CARDIOLOGIST:  Dr. [place] Bing.   PROCEDURE:  Left and right heart catheterization/coronary arteriography.   INDICATIONS:  Evaluate patient with dyspnea and previous coronary  disease.  He has a history of remote inferior wall MI and PCI in 1998.  She had non-obstructive disease on cath in 2002.   PROCEDURE NOTE:  Left heart catheterization was performed of the right  femoral artery, right heart catheterization performed via the right  femoral vein.  Both vessels were cannulated using anterior wall  puncture.  A #4-French arterial sheath and a #7-French venous sheath  were inserted, via the modified Seldinger technique.  Pre-formed Judkins  and a pigtail catheter were utilized.  The patient tolerated the  procedure well and left the lab in stable condition.  Of note, a Swan-  Ganz catheter was utilized for the right heart pressures.   HEMODYNAMIC RESULTS:  RA mean 3, RV mean 3, PA 20/6 with a mean of 12,  pulmonary capillary wedge pressure mean 3, cardiac output/cardiac index  (Fick) 4.4/2.3.  Coronaries:  The left main was normal.  The LAD had  proximal 25% stenosis, followed by 25% stenosis.  There was a first  diagonal which was moderate size and normal.  The circumflex was a  dominant vessel.  There was a proximal 25% stenosis.  There was mid-long  25% stenosis.  The ramus intermediate was small with ostial 25%  stenosis.  Obtuse marginal 1 was large with ostial 30% stenosis.  There  was an obtuse marginal 2 which was long, but a narrow-caliber vessel.  It had a long ostial and proximal 60%  stenosis.  Posterolateral was  small and normal.  The PDA was small with luminal irregularities.  The  right coronary artery was a nondominant vessel, giving off a small to  moderate-sized RV branch.  It did have a proximal 80% stenosis but was a  narrow caliber vessel.  Left ventriculogram:  The left ventriculogram  was obtained in the RAO projection.  The EF was 55% with inferior  basilar hypo to akinesis.   CONCLUSION:  Non-obstructive coronary artery disease in large vessels.  There is a mildly-reduced ejection fraction and regional wall motion  abnormality, consistent with previous inferior infarct.  Pulmonary  pressures are normal.   PLAN:  Based on the above, I do not see a lesion that would be the  obvious culprit for his dyspnea.  I have discussed with Dr. Gerda Diss to  consider continuing pulmonary evaluation.  He needs continued aggressive  risk reduction.      Rollene Rotunda, MD, [facility]  Electronically Signed     JH/MEDQ  D:  03/21/2007  T:  03/21/2007  Job:  [id number]  cc:   Gerrit Friends. Dietrich Pates, MD, [facility]  [name], MD

## 2010-10-27 NOTE — Letter (Signed)
May 05, 2007    [name], M.D.  [address]  [place], [place] [address]   RE:  MELTON, WALLS  MRN:  [id number]  /  DOB:  12-Nov-1955   Dear [name]:   Mr. Radoncic returns to the office for continued assessment and treatment  of coronary disease and cardiovascular risk factors.  Unfortunately, he  reports severe fatigue and malaise.  He is actually thinking about  quitting his job.  He has had dyspnea on exertion that is being  evaluated by Dr. Maple Hudson.  He has a history of sleep apnea, but does not  wear his nocturnal mask much due to a poor fit.  He has had no chest  discomfort.  He also has a sleep disturbance that has been partially  addressed with Ambien and alprazolam.  His other medications include  lisinopril 20 mg daily, aspirin 81 mg daily, Nabumetone  500 mg b.i.d.,  Ambien 10 mg daily, and Combivent inhaler that he uses on a p.r.n.  basis.   On exam, a pleasant, trim gentleman in no acute distress.  The weight is  163, one pound less than in October.  Blood pressure 125/85, heart rate  80 and regular, respirations 16.  He brings in a list of 20 blood  pressures, all of which are acceptable.  NECK:  No jugular venous distention; no carotid bruits.  LUNGS:  Clear.  CARDIAC:  Normal first and second heart sounds; fourth heart sound  present.  ABDOMEN:  Soft and nontender; no organomegaly.  EXTREMITIES:  No edema.   IMPRESSION:  Mr. Almeda is doing well from a cardiovascular standpoint.  Blood pressure remains adequately controlled despite discontinuing  metoprolol.  Unfortunately, that did not have a beneficial effect on his  symptoms.  As we discussed, he will call your office for treatment and  follow-up of apparent depression.  I will plan to see this nice  gentleman again in seven months.  Dr. Maple Hudson will address management of  his sleep apnea.    Sincerely,      Gerrit Friends. Dietrich Pates, MD, [facility]  Electronically Signed    RMR/MedQ  DD: 05/05/2007   DT: 05/05/2007  Job #: [id number]

## 2010-10-27 NOTE — Assessment & Plan Note (Signed)
[facility]                             PULMONARY OFFICE NOTE   NAME:[name], [name]                       MRN:          [id number]  DATE:04/05/2007                            DOB:          [birth year]    PULMONARY FOLLOWUP OFFICE VISIT   [name] is a 55 year old gentleman who has mild COPD based on  spirometry in the past.  He also has mild obstructive sleep apnea (AHI  of 7 events per hour) and has not tolerated CPAP very well.  He admits  to using this only for 2 to 3 hours maybe 2 to 3 nights a week at the  most.  Recent cardiac evaluation was undertaken due to chest pain and  decreased exercise tolerance, and pain under his ribs.  This showed no-  critical coronary artery disease, inferior basal hypokinesis, and an  ejection fraction of 55%.  At that time, a remote history of an inferior  wall myocardial infarction, medical therapy was recommended.  Of note,  during his treadmill stress test, some chronotropic incompetence was  noted.  He is being maintained on beta blocker, ACE inhibitor, and lipid  management.   He continues to smoke about 1/2 pack per day in spite of multiple  counseling sessions.  His main complaint is decreased exercise  tolerance.  There have been occasions when dyspnea has woken him up from  sleep.  This raises the question of anxiety.  However, Xanax that was  prescribed by his PMD, did not seem to have much effect.  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  His wife is also worried because of a strong family history of lung  cancer.   CURRENT MEDICATIONS:  1. Vytorin 10/80 mg daily.  2. Metoprolol 25 mg b.i.d.  3. Aspirin 81 mg daily.  4. Alprazolam 1 mg b.i.d.  5. Lisinopril 5 mg daily.  6. Hydrocodone 5/500 mg q.4h p.r.n.  7. Albuterol nebs as needed.  8. Combivent MDI as needed.   EXAM:  Weight  164 pounds, temperature 97.5, blood pressure 126/82, heart  rate 73 per minute, oxygen saturation 98% on room air.  HEENT:  Narrow pharyngeal space.  CVS:  S1, S2 normal.  NECK:  Supple.  No lymphadenopathy.  CHEST:  Decreased breath sounds in both bases.   His baseline saturation was 97% with a heart rate of 80.  On ambulation  (3 laps) his heart rate increased to 112 and oxygen saturation decreased  to 90%.   I do note a right upper lobe right apical bulla on a CT scan in 2006.  Recent chest x-ray does not demonstrate any infiltrates.   IMPRESSION:  1. Mild to moderate chronic obstructive pulmonary disease, active      smoker.  2. Oxygen desaturation with exercise.  3. Non-critical coronary artery disease.  History of inferior wall      myocardial infarction.  Mild left ventricular dysfunction.  4. Chronotropic incompetence.   His main  complaint seems to be decreased exercise tolerance, generalized  weakness, and pain around his ribcage.   RECOMMENDATIONS:  1. Spiriva HandiHaler will be started and HandiHaler technique was      demonstrated to him.  Spirometry will be obtained during his next      visit.  2. If his symptoms persist, I would consider checking a CK level for      adverse effects of Statins and further exploring the chronotropic      incompetence that was demonstrated on his treadmill stress test.  3. Oxygen desaturation during exercise does denote cardiopulmonary      limitation.  I would not be surprised to find that his lung      function has deteriorated in the last 2 years.  4. I once again consulted him about smoking cessation for 5 minutes.     Oretha Milch, MD  Electronically Signed    RVA/MedQ  DD: 04/05/2007  DT: 04/06/2007  Job #: [id number]   cc:   Gerrit Friends. Dietrich Pates, MD, [facility]  Clinton D. Young, MD, FCCP, FACP  [name], MD

## 2010-10-27 NOTE — Letter (Signed)
August 29, 2008    [name], MD  [address]  [place], [place] [address]   RE:  [name], [name]  MRN:  [id number]  /  DOB:  12-29-1955   Dear [name]:   Mr. Beitler returns to the office for continued assessment and treatment  of coronary artery disease and cardiovascular risk factors.  Since I  last saw him 4 months ago, he has done generally well.  He reports no  chest discomfort.  He has no significant dyspnea.  He continues to taper  cigarettes, currently smoking somewhat less than one-half pack per day.  He has not been told of any hypertension.  His problem has been  abdominal pain.  He initially presented with abdominal discomfort in  2004, ultimately undergoing laparoscopic cholecystectomy.  He initially  did well, but has had recurrent abdominal pain in recent years.  This is  crump mid epigastric discomfort of moderate severity that is  intermittent and unpredictable.  A softer liquid bowel movement within  15 minutes of any time he eats, he has recently been evaluated by Dr.  Jena Gauss and undergone upper and lower endoscopy without which sounds like  very significant findings.  He did have some polyps that were removed.   Medications are unchanged from his last visit except for the addition of  Kapidex 60 mg daily 2 days ago.  He has not noted any benefit from this  yet.   PHYSICAL EXAMINATION:  GENERAL:  Acutely uncomfortable thin gentleman.  VITAL SIGNS:  The weight is 155, unchanged.  Blood pressure 115/70,  heart rate 80 and regular, respirations 14 and unlabored.  NECK:  No  jugular venous distention; no carotid bruits.  LUNGS:  Clear.  CARDIAC:  Normal first and second heart sounds; normal PMI.  ABDOMEN:  Flat and nontender; normal bowel sounds; no organomegaly.  EXTREMITIES:  1+ dorsalis pedis pulses; 2+ posterior tibial pulses; no  edema.   IMPRESSION:  Mr. Viveros is doing well from a cardiac standpoint.  He has  no symptoms at present.  His last lipid  profile was excellent.  Blood  pressure is good.  He has once again advised completely discontinue use  of tobacco products.  We will continue his current medications and plan  a return office visit in 1 year.   His abdominal pain is ongoing.  It sounds as if irritable bowel syndrome  is in the differential.  He is scheduled to see Dr. Jena Gauss within the  next 2 weeks for reassessment.    Sincerely,      Gerrit Friends. Dietrich Pates, MD, [facility]  Electronically Signed    RMR/MedQ  DD: 08/29/2008  DT: 08/29/2008  Job #: [id number]

## 2010-10-27 NOTE — Letter (Signed)
April 06, 2007    [name], MD  [address], [address]  [place], [place] [address]   RE:  [name], [name]  MRN:  [id number]  /  DOB:  1955-09-21   Dear [name]:   [name] returns to the office following a recent cardiac  catheterization.  This revealed essentially no progression of disease  since May 2000, and preserved left ventricular systolic function.  His  principle problem appears to be fatigue and exertional dyspnea.  He has  been seen by [facility] who added Spiriva to his regimen and are  continuing their evaluation.  Some of his symptoms sound to possibly  represent depression.  He describes bilateral rib pain that occurs  intermittently and is of moderate severity.  This is not related to  exertion and resolved spontaneously.   CURRENT MEDICATIONS:  1. Lisinopril 5 mg daily.  2. Metoprolol 25 mg b.i.d.  3. Aspirin 81 mg daily.  4. Alprazolam 1 b.i.d.  5. Nabumetone 500 mg b.i.d.  6. Vytorin 10/80 mg daily.  7. Spiriva daily.   PHYSICAL EXAMINATION:  GENERAL:  Trim gentleman in no acute distress.  VITAL SIGNS:  The weight is 164, 3 pounds less than earlier this month.  Blood pressure  120/80, heart rate 64 and regular, respirations 16.  NECK:  No jugular venous distention; no carotid bruits.  CARDIAC:  Normal 1st and 2nd heart sounds; 4th heart sound present.  ABDOMEN:  Soft and nontender; no organomegaly.  EXTREMITIES:  Benign catheterization sites; no edema.   Last lipid profile was 8 months ago and was acceptable.   IMPRESSION:  [name] is doing well from a cardiac standpoint.  His  coronary disease appears to have stabilized.  Metoprolol may be  contributing to fatigue and depression.  We will discontinue this  medicine and increase Lisinopril to 20 mg daily.  He will probably need  a diuretic as well.  He will monitor home blood pressures.  You may wish  to evaluate him for possible depression and institute appropriate  therapy.  I will see  him again in 1 month.    Sincerely,      Gerrit Friends. Dietrich Pates, MD, [facility]  Electronically Signed    RMR/MedQ  DD: 04/06/2007  DT: 04/06/2007  Job #: [id number]

## 2010-10-27 NOTE — Letter (Signed)
April 15, 2008    [name], MD  [address]  [place], [place] [address]   RE:  [name], [name]  MRN:  [id number]  /  DOB:  [birth year]   Dear [name]:   It is my pleasure evaluating [name] in followup at your request.  Unfortunately, his routine appointment had been missed some months ago.  As you know, he experienced an episode of moderately severe chest  pressure associated with dyspnea a few weeks ago.  This persisted for an  hour to before he took sublingual nitroglycerin, which provided him with  relief.  He subsequently has felt even more fatigued than usual.   CURRENT MEDICATIONS:  1. Vytorin 10/80 mg daily.  2. Aspirin 81 mg daily.  3. Alprazolam 1 mg b.i.d.  4. Lisinopril 20 mg daily.  5. Nocturnal CPAP.  6. Sertraline 100 mg daily.   He had been evaluated by our Pulmonary Department some months ago.  A  cardiopulmonary stress test was planned, but never carried out.   PHYSICAL EXAMINATION:  GENERAL:  Depressed appearing gentleman in no  acute distress.  VITAL SIGNS:  The weight is 156, 8 pounds less than in October of last  year.  Blood pressure 115/85, heart rate 70 and regular, respirations  14.  NECK:  No jugular venous distention; no carotid bruits.  CARDIAC:  Normal first and second heart sounds; normal PMI.  LUNGS:  Surprisingly clear.  ABDOMEN:  Soft and nontender; no organomegaly.  EXTREMITIES:  No edema; normal distal pulses.   EKG:  Normal sinus rhythm; nondiagnostic inferior Q-waves; otherwise  normal.   IMPRESSION:  [name] has recurrent chest pain with known moderately  severe coronary disease.  He has not required intervention since balloon  angioplasty of the right coronary for an inferior myocardial infarction  in 1995.  We will proceed with a stress test to reevaluate the severity  of coronary disease.  Unfortunately, I do not think there is much we can  do about his chronic fatigue.  I will see about arranging a  cardiopulmonary stress test once we have evaluated his ischemic heart  disease.  He continues to smoke one-half pack of cigarettes per day and  seems incapable of discontinuing the use of tobacco products.    Sincerely,      [name] Friends. Dietrich Pates, MD, [facility]  Electronically Signed    RMR/MedQ  DD: 04/15/2008  DT: 04/16/2008  Job #: [id number]

## 2010-10-27 NOTE — Assessment & Plan Note (Signed)
[facility]                       [place] CARDIOLOGY OFFICE NOTE   NAME:Gellert, WADELL CRADDOCK                       MRN:          [id number]  DATE:03/16/2007                            DOB:          12-Mar-1956    IDENTIFICATION:  Mr. Farooqui is a 55 year old gentleman who is usually  followed by Donnamarie Rossetti in the clinic.  He comes in today, a referral  from [facility], for evaluation of chest pressure and shortness of  breath.   HISTORY OF PRESENT ILLNESS:  The patient notes recently he has just been  getting very, very fatigued, tired, drained, run down.  He says with  walking down his driveway, he will have to stop 2-3 times.  His heart  will race.  He gets short of breath.  He notes some tightness in his  chest.  The tightness relieves with rest.  He says since February, his  breathing has been getting worse.  He had a lot of trouble with the hot  weather.  He thought things would get better once it cooled, but it has  not.  He denies any chest pressure at rest.  No wheezing.  He was seen  by Dr. Gerda Diss and referred here for further evaluation.   CURRENT MEDICATIONS:  1. Lisinopril 5.  2. Metoprolol 25 b.i.d.  3. Aspirin 81.  4. Xanax b.i.d. 1.  5. Relafen 2 daily, 500 mg.  6. Vytorin 10/80 1 daily.  7. Nebulized albuterol.  8. Albuterol MDI.  9. CPAP.   PAST MEDICAL HISTORY:  1. Coronary artery disease, status post remote inferior wall MI with      PCI in 1995.  Last cardiac catheterization in 2002 showed left main      normal, LAD at a proximal 25-40% mid lesion.  Circumflex had 20%.      OM 1 was small.  OM II had a 30%.  OM III had 30%.  RCA was      nondominant.  Did not comment on previous stent site.  Note, the      patient also had a Myoview scan done in January of this year that      showed inadequate chronotropic response, a work load of 10 mets,      heart rate of less than 70% predicted maximal.  No ST changes to      suggest  ischemia.  Patient went on to have Adenosine testing      because of inadequate heart rate.  This showed inferolateral scar      with minimal superimposed ischemia.  2. Dyslipidemia:  On Vytorin.  Last lipid panel I can see here from      our office was February, 2008.  LDL was 100.  HDL was 35.      Triglycerides 103.  3. History of syncope in the past, felt vasovagal.  4. History of lower GI bleed.  5. COPD, felt to be mild by previous PFTs.  Followed by Fannie Knee.      Set up for PFTs in January of next year.  6. Sleep apnea:  Patient  tries to wear a CPAP.  Difficult at times.  7. History of tension pneumothorax.   PAST SURGICAL HISTORY:  Status post cholecystectomy in 2004.   ALLERGIES:  None.   SOCIAL HISTORY:  The patient continues to smoke a half pack per day.  No  significant ETOH.   REVIEW OF SYSTEMS:  All systems reviewed.  Negative for the above  problem, except as above-noted.   PHYSICAL EXAMINATION:  On exam, the patient is in no distress.  Blood  pressure is up a little bit today at 148/102.  Previous visit in  pulmonary 132/70 in June.  Pulse of 79.  Weight is 167, stable.  HEENT:  Normocephalic and atraumatic.  EOMI.  PERRL.  NECK:  JVP is normal.  No audible bruits.  LUNGS:  She is moving air.  No rales or wheezes.  A slight decrease in  expiratory flow but not large.  CARDIAC:  Regular rate and rhythm.  S1 and S2.  No S3, S4, or murmurs.  ABDOMEN:  Supple, nontender.  No hepatomegaly.  EXTREMITIES:  Posterior tibial pulses 2+.  No lower extremity edema.   A 12-lead EKG shows a normal sinus rhythm at 79 beats per minute.  Nonspecific T wave changes.  A possible inferior wall MI.   IMPRESSION:  Patient is a 55 year old gentleman with known coronary  artery disease, who has had progressive dyspnea and chest tightness over  the past nine months.  A little bit of a difficult historian.  He does  have chronic obstructive pulmonary disease, but it appears to be  mild.  He also has sleep apnea, using a CPAP some.  There has not been a change  in this.  This may explain some of his fatigue.   Still his history is concerning.  I would recommend a cardiac  catheterization to exclude progressive coronary artery disease.  Also do  a right heart cath to evaluate his lung pressures.   Of note, in his treadmill test back in the spring, he has some  chronotropic incompetence.  If indeed his cath is negative, this will  need to be pursued further.  If the cath is negative, also further  pulmonary evaluation would be warranted.   We will check pre-cath labs as well as a TSH.  Schedule this in the  outpatient [facility] lab at his convenience.  Patient understands the planned  procedure and agrees to proceed.     Pricilla Riffle, MD, [facility]  Electronically Signed    PVR/MedQ  DD: 03/16/2007  DT: 03/17/2007  Job #: [id number]   cc:   [name] A. Gerda Diss, MD

## 2010-10-27 NOTE — H&P (Signed)
[name], [name]                ACCOUNT NO.:  [id number]   MEDICAL RECORD NO.:  [id number]          PATIENT TYPE:  AMB   LOCATION:  DAY                           FACILITY:  [facility]   PHYSICIAN:  R. Roetta Sessions, M.D. DATE OF BIRTH:  1956-04-29   DATE OF ADMISSION:  DATE OF DISCHARGE:  LH                              HISTORY & PHYSICAL   CHIEF COMPLAINT:  History of colonic polyps, carcinoma in situ.   HISTORY OF PRESENT ILLNESS:  [name] is a pleasant 55-year  gentleman who underwent a colonoscopy by Dr. 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  He had a followup flexible sigmoidoscopy in 2005 which  demonstrated a small polyp which was snared and apparently was not  recovered, there is no path report in the chart.  [name] has done  well.  He has intermittent postprandial abdominal cramps, diarrhea but  has not had any melena or hematochezia.  He does have some vague  epigastric pain which bothers him from time to time and describes vague  early satiety and states he has lost some weight recently although we  have him down just 3 pounds going back to 2004.  He has not had any  odynophagia, dysphagia, early sign of reflux symptoms, nausea or  vomiting and does not use any NSAID aside from an 81 mg aspirin.  He  does not consume alcohol aside from maybe 1-2 beers occasionally.  He  does smoke 1 pack of cigarettes a day.  He underwent an EGD at the time  of colonoscopy in 2004 and was found to have Helicobacter pylori  gastritis which was treated with Prevpac.   PAST MEDICAL HISTORY:  Significant for coronary artery disease, status  post MI and stent placement.  He is followed by cardiologist over at  [facility].  He has sleep apnea, hypertension, hypercholesterolemia,  anxiety, neurosis, depression, asthma, bronchitis.   PAST SURGICAL HISTORY:  CAD, stent placement, catheterization multiple  times.  He is  status post cholecystectomy by Dr. Erskine Speed back in  2004-2005, colonoscopy and sigmoidoscopy as outlined above.  He also had  an EGD back in 2004 as above.   CURRENT MEDICATIONS:  1. Vytorin 10/80 daily.  2. Lisinopril daily.  3. ASA 81 mg daily.  4. Alprazolam 0.5 mg 1-2 daily.  5. Symbicort.  6. Albuterol.  7. CPAP.   ALLERGIES:  No known drug allergies.   FAMILY HISTORY:  Negative for chronic GI or liver illness.  No history  of GI neoplasia.   SOCIAL HISTORY:  The patient is married.  He has four stepchildren and  grandchildren and great-grandchildren.  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  He smokes one  pack of cigarettes per day.  Occasionally consumes a beer.  No illicit  drugs.   REVIEW OF SYSTEMS:  No recent chest pain or dyspnea on exertion.  No  fever or chills.   PHYSICAL EXAMINATION:  GENERAL:  A pleasant 55 year old gentleman who  reeks of cigarette smoke.  No acute distress.  VITAL SIGNS:  Weight 162, height 5 feet 11 inches.  Temperature 97.7, BP  120/84, pulse 72.  SKIN:  Warm and dry.  No jaundice.  No symptoms of chronic liver  disease.  HEENT:  No scleral icterus.  Conjunctivae pink.  Oral cavity no lesions.  CHEST:  Lungs were clear to auscultation.  CARDIAC:  Regular rate and rhythm without murmur, gallop or rub.  ABDOMEN:  Nondistended.  Positive bowel sounds.  Soft.  He does have  some periumbilical tenderness to palpation.  No appreciable mass or  hepatosplenomegaly.  EXTREMITIES:  No edema.  RECTAL:  Exam deferred to colonoscopy.   IMPRESSION:  [name] is a pleasant 55 year old gentleman with a  history of colonic adenomas with a rectal polyp containing carcinoma in  situ back in 2004, essentially negative sigmoidoscopy in 2005 except for  a small polyp which was lost, it was resected.  He has vague epigastric  pain and some early satiety and subjectively has sustained some weight  loss.   Symptoms are nonspecific but deserve further investigation.   RECOMMENDATIONS:  I told [name] he ought to go ahead and have a  surveillance colonoscopy at this time.  He is somewhat overdue as well  as a diagnostic EGD.  Risks, benefits, alternatives and limitations have  been reviewed, questions answered.  He is agreeable.  I will make  further recommendations in the very near future.      [name], M.D.  Electronically Signed     RMR/MEDQ  D:  08/14/2008  T:  08/14/2008  Job:  [id number]   cc:   [name] A. Gerda Diss, MD  Fax: [phone]

## 2010-10-27 NOTE — Assessment & Plan Note (Signed)
[facility]                             PULMONARY OFFICE NOTE   NAME:[name]                       MRN:          [id number]  DATE:05/04/2007                            DOB:          1956-05-08    PROBLEM LIST:  1. Mild obstructive sleep apnea (7 per hour).  2. Excessive daytime somnolence/fatigue.  3. Depression.  4. Periodic limb movement with arousal.  5. Probable esophageal reflux.  6. Tobacco abuse.  7. Chronic obstructive pulmonary disease.  8. Dyspnea.  9. Coronary disease/infarction.   HISTORY:  He was seen in my absence by Dr. Vassie Loll in October.  Spiriva was  started and spirometry scheduled.  Dr. Vassie Loll noted chronotropic cardiac  incompetence on his treadmill stress test and wondered about getting a  CK level for adverse effect of Statins, although the patient is not  complaining of muscle aches.  He is just discouraged and beginning to  consider disability.  He desaturated during exercise.  He was again  counseled on smoking cessation which we have done every visit.  He  continues to smoke 1/2 pack per day.  He had his flu shot and 1st  pneumococcal vaccine this year.  He continues to work outdoors for the  [employer].   MEDICATIONS:  1. Vytorin 10/80.  2. Lisinopril 20 mg.  3. Aspirin 81 mg.  4. Zolpidem 10 mg.  5. Alprazolam 1 mg.  6. He has a Proventil HFA inhaler and a home albuterol nebulizer.  7. He is taking some Vicodin 5/500.   OBJECTIVE:  VITAL SIGNS:  Weight 165 pounds, BP 120/96, pulse 80, room  air saturation 97%.  CHEST:  Clear with light dry cough after end expiration.  MENTAL STATUS:  Affect is rather flat.  HEART:  Sounds are regular without murmur.  I do not find adenopathy,  edema, or tremor.   Pulmonary function test:  Mild obstructive airways disease with an FEV1  of 3.06 (83%) after dilator, FEV1/FVC 0.63 insignificant response to  bronchodilator.  There was a little air  trapping with residual volume  125% and mild reduction of diffusion capacity.  I do not have his  comparison available yet from previous study.   IMPRESSION:  I have thought that his complaint of total loss of energy  which has been persistent ever since I have known him.  He is on both  Vytorin and metoprolol and twice a day alprazolam.  His exertional  dyspnea complaint has seemed out of line with his physical exam.   PLAN:  1. Smoking cessation was again strongly emphasized.  2. We are scheduling a cardiopulmonary exercise test through Dr.      Prescott Gum lab to try to evaluate his dyspnea/fatigue complaint.  3. Schedule return in 3 months, earlier p.r.n.     Clinton D. Maple Hudson, MD, Tonny Bollman, FACP  Electronically Signed    CDY/MedQ  DD: 05/06/2007  DT: 05/07/2007  Job #: [id number]   cc:   [name] A. Gerda Diss, MD

## 2010-10-29 LAB — LIPID PANEL
HDL: 30 mg/dL — ABNORMAL LOW (ref >39)
Triglycerides: 167 mg/dL — ABNORMAL HIGH (ref <150)
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
## 2010-10-30 NOTE — Procedures (Signed)
[name], [name]                ACCOUNT NO.:  [id number]   MEDICAL RECORD NO.:  [id number]          PATIENT TYPE:  OUT   LOCATION:  SLEEP LAB                     FACILITY:  [facility]   PHYSICIAN:  Marcelyn Bruins, M.D. [facility] DATE OF BIRTH:  1956/04/25   DATE OF STUDY:  07/19/2004                              NOCTURNAL POLYSOMNOGRAM   DATE OF STUDY:  July 19, 2004.   REFERRING PHYSICIAN:  Dr. [name]   INDICATION FOR THE STUDY:  Hypersomnia with sleep apnea.   SLEEP ARCHITECTURE:  The patient had a total sleep time of 365 minutes with  a sleep efficiency of 82%. There was decreased REM and minimal slow wave  sleep. Sleep onset latency was very prolonged at 48 minutes and REM latency  was within normal limits.   IMPRESSION:  1.  Very mild obstructive sleep apnea/hypopnea syndrome with a respiratory      disturbance index of 7 events per hour and oxygen desaturation as low as      84%. Events were somewhat more pronounced during rapid eye movement.      Because of the small numbers of events the patient did not meet split-      night protocol. 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  2.  Mild to moderate snoring noted throughout the study.  3.  No clinically significant cardiac arrhythmias.  4.  Large numbers of leg jerks with mild to moderate sleep disruption.      Clinical correlation is suggested.      KC/MEDQ  D:  08/03/2004 12:41:05  T:  08/03/2004 21:37:40  Job:  [id number]

## 2010-10-30 NOTE — Assessment & Plan Note (Signed)
 [facility]                               PULMONARY OFFICE NOTE   NAME:[name], [name]                       MRN:          [id number]  DATE:03/21/2006                            DOB:          09-15-1955    PROBLEM:  1. Minimal obstructive sleep apnea (7 per hour).  2. Excessive daytime somnolence/fatigue.  3. Periodic limb movement with arousal.  4. Probable esophageal reflux.  5. Tobacco abuse.  6. Chronic obstructive pulmonary disease.  7. Dyspnea.  8. Coronary disease/infarction.  9. Depression.   HISTORY:  He says his head and chest have been congested and he has been  short of breath for a few months, such that he barely makes it through the  day.  He has to come home and lie down.  He cut down on his smoking some,  not a whole lot.  Scant white sputum.  Describes a band-like tightness  around his chest at times with no chest pain.  He tried Chantix but not very  aggressively.  He says he cannot sleep with CPAP on, despite a mask change.  Pressure is currently set at 10.   MEDICATIONS:  1. Aspirin.  2. Citalopram.  3. Metoprolol 50 mg x1 or 2.  4. Vytorin.  5. Xanax 1 mg.  6. CPAP 10 CWP.  7. Paxil 40 mg.  8. Celexa 40 mg.  9. Combivent home nebulizer with albuterol.   ALLERGIES:  No known drug allergies.   OBJECTIVE:  GENERAL:  A depressed affect.  VITAL SIGNS:  Weight 163 pounds.  blood pressure 155/88, pulse regular at  87.  Room air saturation 99%.  CHEST:  A quiet, clear chest, but he does have a moderately-congested  sounding cough.  HEENT:  Minimal nasal congestion.  NECK:  No adenopathy.  No neck vein distention.  HEART:  Sounds regular, without murmur.   IMPRESSION:  1. Chronic bronchitis, probably with some exacerbation this summer.  2. Ongoing tobacco abuse.  3. I think depression is a big part of his problem, and there is treatment      in place.   PLAN:  1. Heavy emphasis on our discussion on smoking  cessation.  2. He will follow up with Dr. [name] A. Luking about his mood disorder, for      possible referral.  3. Flu vaccine.  4. A trial of nebulizer treatment here [place].  Consider for long-term      use.  5. Schedule refill for Symbicort 160/4.5 mg.  6. Call for prescription refills as needed.  7. Schedule a return for six months, earlier p.r.n.       Clinton D. Maple Hudson, MD, FCCP, FACP      CDY/MedQ  DD:  03/21/2006  DT:  03/23/2006  Job #:  [id number]   cc:   [name] A. Gerda Diss, MD  Gerrit Friends. Dietrich Pates, MD, [facility]

## 2010-10-30 NOTE — Cardiovascular Report (Signed)
[place]  Patient:    [name], [name]                      MRN: [id number] Proc. Date: 08/17/00 Adm. Date:  08/16/00 Attending:  Daisey Must, M.D. [facility] CC:         [name], M.D.  Gerrit Friends. Dietrich Pates, M.D. [facility]  Cath Lab   Cardiac Catheterization  PROCEDURE:  Left heart catheterization with coronary angiography and left ventriculography.  INDICATIONS:  [name] is a 55 year old male with a history of previous myocardial infarction treated with PTCA.  He was admitted to [facility] with progressive substernal chest pain and referred for cardiac catheterization.  DESCRIPTION OF PROCEDURE:  A 6 French sheath was placed in the right femoral artery.  Standard Judkins 6 French catheters were utilized.  Contrast was Omnipaque.  There were no complications.  RESULTS:  HEMODYNAMICS:  Left ventricular pressure 110/14, aortic pressure 104/62. There was no aortic valve gradient.  LEFT VENTRICULOGRAM:  There is mild akinesis of the mid inferior wall. Ejection fraction calculated at 52%.  No mitral regurgitation.  CORONARY ARTERIOGRAPHY:  (Left dominant).  Left main is normal.  Left anterior descending artery has a 25% stenosis in the proximal vessel.  In the proximal to mid vessel is a focal 40% stenosis at the origin of the first diagonal branch.  Further down in the mid vessel is a 20% stenosis.  The first diagonal is normal in size and has a 40% stenosis at its origin.  The second diagonal is small.  Left circumflex is a dominant vessel.  It has a 20% stenosis proximally. There is a small first marginal with a 60% stenosis at its origin.  OM2 is large with 30% stenosis, OM3 is normal size with 30% stenosis.  The distal left circumflex gives rise to a normal size first posterolateral, small second posterolateral, and a normal size posterior descending artery.  The right coronary artery is a small nondominant vessel  and free of angiographic disease.  IMPRESSION: 1. Mildly decreased left ventricular systolic function. 2. Mild to moderate coronary artery disease as described with no critical or    flow limiting disease seen.  PLAN:  Medical therapy. DD:  08/17/00 TD:  08/18/00 Job: [id number]

## 2010-10-30 NOTE — Procedures (Signed)
[facility]  Patient:    [name], [name] Visit Number: [id number] MRN: [id number]          Service Type: MED Location: 2000 2007 01 Attending Physician:  Shawnie Pons Da Dictated by:   Kari Baars, M.D. Proc. Date: 06/06/01 Admit Date:  05/29/2001 Discharge Date: 06/01/2001                            EKG Interpretations  TIME:  1836, May 29, 2001  The rhythm is sinus rhythm with rate in the 70s.  There is a PVC.  There is an incomplete right bundle branch block.  Q waves are seen inferiorly.  This may be due to the incomplete right bundle branch block and may be due to previous inferior infarction, and clinical correlation is suggested.  IMPRESSION:  A normal electrocardiogram. Dictated by:   Kari Baars, M.D. Attending Physician:  Shawnie Pons Da DD:  06/06/01 TD:  06/07/01 Job: [id number]

## 2010-10-30 NOTE — Procedures (Signed)
NAME:  [name], [name]                    ACCOUNT NO.:  `   MEDICAL RECORD NO.:  [id number]           PATIENT TYPE:   LOCATION:                                 FACILITY:   PHYSICIAN:  [name], MD         DATE OF BIRTH:   DATE OF PROCEDURE:  DATE OF DISCHARGE:                                EKG INTERPRETATION   PROCEDURE:  EKG.      SAL/MEDQ  D:  06/18/2004  T:  06/18/2004  Job:  [id number]
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
## 2010-10-30 NOTE — Consult Note (Signed)
NAMECLANTON, EMANUELSON                            ACCOUNT NO.:  [id number]   MEDICAL RECORD NO.:  [id number]                  PATIENT TYPE:   LOCATION:                                       FACILITY:   PHYSICIAN:  Lionel December, M.D.                 DATE OF BIRTH:  10/28/1955   DATE OF CONSULTATION:  11/01/2002  DATE OF DISCHARGE:                                   CONSULTATION   REQUESTING PHYSICIAN:  [name], M.D.   REASON FOR CONSULTATION:  Rectal bleeding and epigastric pain.   HISTORY OF PRESENT ILLNESS:  This patient is a 55 year old Caucasian  gentleman who presents today for further evaluation of the above stated  symptoms.  He states that over the last 4 weeks he has had significant  epigastric pain. It tends to be worse when he sits or when he eats. He  denies any vomiting, but does have morning nausea.  He also has typical  reflux symptoms.  He denies any dysphagia, odynophagia, constipation,  diarrhea, or melena.  He has had 2 episodes in the last 2 weeks of bright  red blood per rectum, mixed in the stool and on the toilet tissue. He states  on one occasion it was a significant amount.  This was preceded by abdominal  pain.  He has had no further bleeding in the last week.  He had a  colonoscopy and EGD about 4 years ago by Dr. Franky Macho per his report. He  said he had some polyps.  He denies any NSAID or aspirin use.   CURRENT MEDICATIONS:  1. Lipitor 40 mg daily.  2. Atenolol 50 mg daily.  3. Nitroglycerin spray p.r.n.  4. Xanax 0.5 mg daily p.r.n.  5. Ambien 5 mg p.r.n.   ALLERGIES:  No known drug allergies.   PAST MEDICAL HISTORY:  History of coronary artery disease status post MI in  61.  He had a stent placed at that time.  He had a Cardiolite in 2002  which was negative.  He had catheterization in 1998 at [facility] which showed mild changes per records from Dr. Fanny Dance office.  He also has hypertension and  hypercholesterolemia.   FAMILY HISTORY:  His mother died of heart disease.  She also had blood clot.  Father died of collapsed lung.  No family history of chronic GI illnesses or  colorectal cancer.   SOCIAL HISTORY:  He has been married for 8 years. He has 3 step-children. He  is getting ready to adopt the 54 year old.  He works for the [employer] cabin [employer] in [place].  He smokes 1 pack of cigarettes daily, and  has smoked since age 43.  He rarely consumes a beer.   REVIEW OF SYSTEMS:  Please see HPI for GI.  GENERAL:  Denies any weight  loss.  CARDIOPULMONARY:  Has  not had any recent episodes of chest pain or  shortness of breath.   PHYSICAL EXAMINATION:  VITAL SIGNS:  Weight 165. Height 5 feet 11 inches.  Temperature 98.7, blood pressure 132/90, pulse 78.  GENERAL:  A pleasant, well-nourished, well-developed, male in no acute  distress.  SKIN:  Warm and dry.  No jaundice.  HEENT:  Conjunctivae are pink.  Sclerae.  Pupils are equal, round, and  reacted to light.  Oropharyngeal mucosa moist and pink.  No lesions,  erythema or exudate.  NECK:  No lymphadenopathy or thyromegaly.  CHEST:  Lungs are clear to auscultation.  CARDIOVASCULAR:  Cardiac exam reveals regular rate and rhythm.  Normal S1,  S2.  No murmurs, rubs, or gallops.  ABDOMEN:  Positive bowel sounds, soft, nondistended.  He has mild-to-  moderate epigastric tenderness to deep palpation. No organomegaly or masses.  RECTAL:  Examination is deferred to the time of colonoscopy.  EXTREMITIES:  No edema.   IMPRESSION:  This patient has had a 4-week history of epigastric pain which  tends to be worse with eating. He also had typical reflux symptoms.  Would  be concerned about poorly controlled gastroesophageal reflux disease or even  peptic ulcer disease.  I feel that the reason evidence of hematochezia is  most likely related to the epigastric pain.  He had some abdominal cramping  followed by a moderate  amount of hematochezia.  He may have had an episode  of ischemic colitis.  Given his history of polyps, I have also recommended  that he go ahead and have a colonoscopy, at this time, for further  evaluation.   PLAN:  1. EGD and colonoscopy in the near future.  2. Protonix 40 mg p.o. daily; 30 samples given  3. Further recommendations to follow.   I would like to thank Dr. [name] for allowing [place] to take part in the  care of this patient.     Tana Coast, P.A.                        Lionel December, M.D.    LL/MEDQ  D:  11/01/2002  T:  11/01/2002  Job:  [id number]   cc:   [name] A. Gerda Diss, M.D.  [address]  [place]  [place] [address]  Fax: [phone]

## 2010-10-30 NOTE — H&P (Signed)
NAME:  [name], [name] NO.:  [id number]   MEDICAL RECORD NO.:  [id number]                   PATIENT TYPE:   LOCATION:                                       FACILITY:   PHYSICIAN:  Dalia Heading, M.D.               DATE OF BIRTH:  January 26, 1956   DATE OF ADMISSION:  DATE OF DISCHARGE:                                HISTORY & PHYSICAL   CHIEF COMPLAINT:  Cholecystitis, cholelithiasis.   HISTORY OF PRESENT ILLNESS:  Patient is a 55 year old white male who is  referred for evaluation and treatment of biliary colic secondary to  cholelithiasis.  He has been having right upper quadrant abdominal pain with  radiation to the right flank and back, nausea, post prandial symptoms, fatty  food intolerance, indigestion and bloating for many months.  No fever,  chills, or jaundice have been noted.  Recently the symptoms have worsened.   PAST MEDICAL HISTORY:  1. Includes hypertension.  2. Coronary artery disease.  3. H. pylori positivity.  4. Colon polyps.   PAST SURGICAL HISTORY:  1. Includes a cardiac catheterization in 1996.  2. Chest tube placement for spontaneous pneumothorax.   CURRENT MEDICATIONS:  1. Protonix.  2. Hyoscyamine.  3. Prevacid.  4. Biaxin.  5. Trimox.   ALLERGIES:  No known drug allergies.   REVIEW OF SYSTEMS:  Patient does smoke a pack of cigarettes a day.  He  denies any recent MI, shortness of breath, leg swelling, CVA or chest pain.   PHYSICAL EXAMINATION:  GENERAL:  Patient is a well-developed, well-nourished  white male in no acute distress.  VITAL SIGNS:  He is afebrile.  Vital signs are stable.  HEENT:  Reveals no scleral icterus.  LUNGS:  Clear to auscultation with equal breath sounds bilaterally.  HEART:  Reveals a regular, rate and rhythm without S3, S4 or murmurs.  ABDOMEN:  Soft and nondistended.  It is tender in the right upper quadrant  to palpation.  No hepatosplenomegaly, mass, hernias identified.   An  ultrasound of the gallbladder reveals a thickened gallbladder wall  consistent with a calculus cholecystitis   IMPRESSION:  1. Biliary colic.  2. Chronic cholecystitis.   PLAN:  The patient was scheduled for a laparoscopic cholecystectomy on November 26, 2002.  The risks and benefits of the procedure including bleeding,  infection, hepatobiliary injury, the possibility of an open procedure were  fully explained to the patient.  He gave informed consent.                                                Dalia Heading, M.D.    MAJ/MEDQ  D:  11/20/2002  T:  11/20/2002  Job:  [id number]   cc:   [name]  A. Gerda Diss, M.D.  [address]  [place]  [place] [address]  Fax: [phone]

## 2010-10-30 NOTE — Op Note (Signed)
NAME:  [name], [name]                          ACCOUNT NO.:  [id number]   MEDICAL RECORD NO.:  [id number]                   PATIENT TYPE:  AMB   LOCATION:  DAY                                  FACILITY:  [facility]   PHYSICIAN:  Lionel December, M.D.                 DATE OF BIRTH:  [birth year]   DATE OF PROCEDURE:  08/09/2003  DATE OF DISCHARGE:                                 OPERATIVE REPORT   PROCEDURE:  Flexible sigmoidoscopy with cold snare polypectomy.   ENDOSCOPIST:  Lionel December, M.D.   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  Therefore a  sigmoidoscopy was advised to make sure that this polyp removal was complete.  He has occasional hematochezia with his bowel movements.  He was noted to be  tender in his periumbilical area.  He said a few nights ago he woke up with  pain but this resolved.  He has not had any melena.  The procedure and risks  were reviewed with the patient and informed consent was obtained.   PREOPERATIVE MEDICATIONS:  None.   FINDINGS:  Procedure performed in endoscopy suite.  The patient's vital  signs and O2 saturation were monitored during the procedure and remained  stable.  The patient was placed in the left lateral recumbent position and  rectal examination was performed.  No abnormality noted on external or  digital exam.   Olympus videoscope was placed in the rectum and advanced under vision into  the distal sigmoid colon.  The mucosa of the distal sigmoid colon was  normal.  The rectosigmoid junction and rectum were carefully examined.  The  site, which was felt to be a polypectomy site was unremarkable.  There was,  however, a small polyp at the distal rectum which was cold snared.  I did  not use cautery because he was not prepped for formal colonoscopy.  Endoscopically this polyp was easily  removed; however, it was lost according  to staff.   The endoscope was withdrawn.  The patient tolerated the procedure well.   FINAL DIAGNOSES:  1. No residual polyp noted at polypectomy site.  2. A tiny polyp cold snared from the rectum and was possibly lost.   RECOMMENDATIONS:  1. He will resume his usual medications.  2. Levsin SL q.i.d. p.r.n. for abdominal pain.  However if this pain is     recurrent he will make an appointment to see Dr. Lubertha South or call our     office.  3. He will return for surveillance colonoscopy in 5 years from now.      ___________________________________________  Lionel December, M.D.   NR/MEDQ  D:  08/09/2003  T:  08/09/2003  Job:  [id number]   cc:   Donna Bernard, M.D.  [address]  [place]  [place] [address]  Fax: [phone]

## 2010-10-30 NOTE — Letter (Signed)
July 18, 2006    [name] A. Gerda Diss, MD  [address]  [place], [place]  [address]   RE:  [name], [name]  MRN:  [id number]  /  DOB:  Nov 03, 1955   Dear [name]:   [name] returns to the office for continued assessment and treatment  of exertional symptoms.  He has carried nitroglycerin with him for the  past month, but has only used it once or twice for chest pressure and  dyspnea with exertion.  He has felt somewhat better.  A stress nuclear  study was performed, which revealed mildly impaired left ventricular  systolic function due to prior inferior infarction.  There was minimal,  if any, superimposed ischemia.  He continues to try to taper cigarette  usage, but it does not sound as if he is making much headway.  Medications are unchanged from his last visit.   On exam, pleasant trim gentleman in no acute distress.  The weight is  163, 4 pounds less than 2 weeks ago.  Blood pressure 135/90, heart rate  85 and regular, respirations 16.  NECK:  No jugular venous distention;  normal carotid upstrokes without bruits.  LUNGS:  Prolonged expiratory  phase with mild expiratory rhonchi.  CARDIAC:  Normal first and second  heart sounds; fourth heart sound present.  ABDOMEN:  Soft and nontender;  no bruits; no masses; no organomegaly.  EXTREMITIES:  No edema.   Carotid ultrasound studies and a brain MRI were recently performed.  No  important abnormalities were identified.   I have no record of a lipid profile since mid 2006.  The patient  believes that you are about to send him for a repeat study.   [name] is fairly stable.  I believe that continued cigarette smoking  and chronic bronchitis are his major problems.  I have asked him to  start using nicotine patches and to attempt to completely refrain from  cigarette smoking after the third day.  I would appreciate a copy of his  lipid profile once it has been obtained.  I will plan to see this nice  gentleman again in 6  months.    Sincerely,      [name] Friends. Dietrich Pates, MD, [facility]  Electronically Signed    RMR/MedQ  DD: 07/18/2006  DT: 07/18/2006  Job #: [id number]

## 2010-10-30 NOTE — Discharge Summary (Signed)
[place]  Patient:    [name], [name]                      MRN: [id number] Adm. Date:  08/16/00 Disc. Date: 08/17/00 Attending:  Gerrit Friends. Dietrich Pates, M.D. [facility] Dictator:   Rozell Searing, P.A. CC:         Loran Senters, M.D.   Referring Physician Discharge [place]  PROCEDURES:  Cardiac catheterization on August 17, 2000.  REASON FOR ADMISSION:  [name] is a 55 year old male, a patient of Loran Senters, M.D., and Gerrit Friends. Rothbart, M.D., with previously documented CAD, who initially presented to [facility] with chest discomfort and was subsequently transferred to [facility] for further diagnostic evaluation.  The initial work-up revealed negative cardiac enzymes.  No acute changes noted by EKG.  Given the patients chronic recurrent chest pain and two previous negative stress Cardiolites, the recommendation was to transfer for diagnostic coronary angiography.  PAST MEDICAL HISTORY: 1. Coronary artery disease.    a. Inferior MI/PTCA RCA in 1995.    b. Cardiac catheterization in December of 1999:  30% LAD, 50% OM1, 50% OM2.    c. Exercise stress Cardiolite in March of 2001 and October of 2001 with no       ischemia, focal inferior wall scar, and EF 67%. 2. Dyslipidemia. 3. Hypertension. 4. Longstanding tobacco. 5. Neurocardiogenic syncope. 6. History of tension pneumothorax. 7. Hiatal hernia. 8. Colonic polyp.  LABORATORY DATA:  Sodium 139, potassium 3.6, chloride 106, CO2 29, glucose 125, BUN 11, creatinine 1.0.  INR 1.0.  Admission CXR:  COPD.  HOSPITAL COURSE:  Following transfer from [facility], the patient underwent diagnostic coronary angiography by Daisey Must, M.D. (see catheterization report for full details).  Angiography revealed normal LMCA, 25% proximal, 40%/20% mid LAD, 20% proximal CFX (dominant), 60% OM1, 30% OM2, 30% OM3, normal RCA (nondominant).   Left ventriculogram:  Mild inferior AK, EF 52%, no MR.  Daisey Must, M.D., concluded that there were critical lesions and that the symptoms were probably noncardiac in origin.  He also recommended that the patient proceed with outpatient AVIs for evaluation of claudication.  The patient was also strongly advised to discontinue smoking tobacco.  No medication adjustments made during this brief stay.  The patient did not get a fasting lipid profile while in house.  This will need to be checked as an outpatient.  MEDICATIONS AT DISCHARGE: 1. Aspirin 81 mg q.d. 2. Atenolol 50 mg q.d. 3. Lipitor 40 mg q.d. 4. Serzone 150 mg b.i.d. 5. Aciphex 20 mg q.d. 6. Nitrostat as directed.  ACTIVITY:  Refrain from any heavy lifting, strenuous activity, or driving x 2 days.  ACTIVITY:  Maintain a low-fat, low-cholesterol diet.  SPECIAL INSTRUCTIONS:  Stop smoking tobacco.  Call the office if there is any swelling/bleeding of the groin or fever.  FOLLOW-UP:  The patient is instructed to call to schedule a follow-up appointment with Gerrit Friends. Dietrich Pates, M.D., in approximately two weeks at the [facility].  The patient will need both outpatient ABIs and fasting lipid profile.  DISCHARGE DIAGNOSES: 1. Non-ischemic chest pain.    a. Negative serial cardiac enzymes.    b. Nonobstructive coronary artery disease on cardiac catheterization on       August 17, 2000.    c. Mild left ventricular dysfunction (ejection fraction 52%).    d. History of inferior myocardial infarction/percutaneous transluminal  coronary angioplasty of right coronary artery in 1995. 2. Chronic obstructive pulmonary disease, longstanding tobacco. 3. Hypertension. 4. Dyslipidemia. 5. Intermittent claudication. DD:  08/17/00 TD:  08/17/00 Job: [id number]

## 2010-10-30 NOTE — Op Note (Signed)
NAME:  [name], [name]                          ACCOUNT NO.:  [id number]   MEDICAL RECORD NO.:  [id number]                   PATIENT TYPE:  AMB   LOCATION:  DAY                                  FACILITY:  [facility]   PHYSICIAN:  Lionel December, M.D.                 DATE OF BIRTH:  June 01, 1956   DATE OF PROCEDURE:  11/07/2002  DATE OF DISCHARGE:                                 OPERATIVE REPORT   PROCEDURES:  Esophagogastroduodenoscopy, followed by total colonoscopy.   INDICATIONS:  The patient is a 55 year old Caucasian male with recurrent  epigastric pain which is worse after meals.  He also gives history of  hematochezia and has a history of polyps.  I did look at his old records,  and I noticed that his CLO was positive but he has not been treated to date.  He also had two polyps removed, and these were hyperplastic.   The procedure and risks were reviewed with the patient, and informed consent  was obtained.   PREMEDICATION:  Cetacaine spray for pharyngeal topical anesthesia, Demerol  50 mg IV, Versed 6 mg IV in divided dose.   INSTRUMENT USED:  Olympus video system.   FINDINGS:  Procedure performed in endoscopy suite.  The patient's vital  signs and O2 saturation were monitored during the procedure and remained  stable.   PROCEDURE:  Esophagogastroduodenoscopy.   DESCRIPTION OF PROCEDURE:  The patient was placed in the left lateral  recumbent position and the endoscope was passed via oropharynx without any  difficulty into the esophagus.   Esophagus:  Mucosa of the esophagus normal.  Squamocolumnar junction was  unremarkable.   Stomach:  It was empty and distended very well with insufflation.  Folds in  the proximal stomach were normal.  Examination of the mucosa revealed  petechiae at the antrum and through the gastric body.  The mucosa was  granular.  No ulcer was noted.  The pyloric channel was patent.  Angularis,  fundus, and cardia were examined by retroflexing the  scope and were normal.   Duodenum:  Examination of the bulb revealed petechiae and granularity, but  no ulcers or erosions were noted.  Postbulbar mucosa and folds were normal.  Endoscope was withdrawn and the patient prepared for procedure #2.   PROCEDURE:  Total colonoscopy.   DESCRIPTION OF PROCEDURE:  Rectal examination performed.  No abnormality  noted on external or digital exam.  The scope was placed in the rectum and  advanced under vision into the sigmoid colon and beyond.  Preparation was  satisfactory except he had a moderate amount of stool in his right colon.  I  was able to pass the scope to cecum and identify the ileocecal valve and was  able to examine the blunt end of the cecum, and it was covered with stool  which could not be washed away.  As the scope was withdrawn,  the colonic  mucosa was carefully examined.  It was normal; however, there was a 10 mm  rectal polyp that was sessile.  It was snared piecemeal.  A residual piece  was left and was coagulated  There were two tiny polyps in the rectum, which  were coagulated using snare tip.  The scope was retroflexed to examine the  anorectal junction, and he had hemorrhoids below and above the dentate line.  The endoscope was straightened and withdrawn.  The patient tolerated the  procedure well.   FINAL DIAGNOSES:  1. Gastritis and duodenitis.  This is felt to be due to untreated     Helicobacter pylori infection.  2. A 10 mm polyp snared from the rectum and two smaller ones that were     coagulated using the snare tip.  The rest of the colon was normal;     however, the right colon was suboptimally prepped.  3. Internal/external hemorrhoids.   RECOMMENDATIONS:  1. Prevpac for two weeks.  2. Levsin SL q.i.d. p.r.n.  3. No aspirin for 10 days.  4. I will be contacting the patient with biopsy results.  Will also schedule     him for upper abdominal ultrasound.                                               Lionel December, M.D.    NR/MEDQ  D:  11/07/2002  T:  11/07/2002  Job:  [id number]   cc:   [name] A. Gerda Diss, M.D.  [address]  [place]  [place] [address]  Fax: [phone]

## 2010-10-30 NOTE — Procedures (Signed)
NAMEMILBERN, DOESCHER                ACCOUNT NO.:  [id number]   MEDICAL RECORD NO.:  [id number]           PATIENT TYPE:   LOCATION:                                 FACILITY:   PHYSICIAN:  Edward L. Juanetta Gosling, M.D.DATE OF BIRTH:  Nov 11, 1955   DATE OF PROCEDURE:  DATE OF DISCHARGE:                              PULMONARY FUNCTION TEST   A patient of [name], M.D.   1.  Spirometry shows a mild ventilatory defect with an airflow obstruction      at the level of the smaller airways and a very rounded off appearance to      the flow volume loop.   1.  Total lung capacity is slightly decreased at 75% of predicted, which may      indicate a separate restrictive problem.   1.  DLCO is moderately reduced.   1.  Arterial blood gases are normal.   1.  Based on the rounded off appearance of the flow volume loop, this could      indicate some sort of a thoracic obstruction and clinical correlation is      suggested.  This pulmonary function test compared to one done on September 06, 2000, shows a slightly more rounded appearance of the flow volume      loop than on the previous study, but otherwise pretty much unchanged,      except that his oxygenation is much better on the current study.      ELH/MEDQ  D:  09/01/2004  T:  09/01/2004  Job:  [id number]   cc:   [name] A. Gerda Diss, MD  [address]  [place]  [place] [address]  Fax: [phone]

## 2010-10-30 NOTE — Assessment & Plan Note (Signed)
[facility]                             PULMONARY OFFICE NOTE   NAME:[name], [name] REINE                       MRN:          [id number]  DATE:12/09/2006                            DOB:          05-Sep-1955    PROBLEMS:  1. Mild obstructive sleep apnea (7 per hour).  2. Excessive daytime somnolence/fatigue.  3. Periodic limb movement with arousal.  4. Probable esophageal reflux.  5. Tobacco abuse.  6. Chronic obstructive pulmonary disease.  7. Dyspnea.  8. Coronary disease/infarction.  9 . Depression.   HISTORY:  He is working outdoors.  He states the heat keeps him  drained.  He sleeps on the sofa after work.  Some cough, which is dry.  Occasional chest tightness.  No pain.  We discussed lisinopril, but it  is not causing much of a cough to require any medication change.  We  reviewed pulmonary function tests from 2006, which had shown  surprisingly good air flow.   MEDICATIONS:  1. Lisinopril 5 mg.  2. Metoprolol 1/2x50 mg b.i.d.  3. Aspirin 81 mg.  4. Alprazolam.  5. Relafen 500 mg x2 daily.  6. Vytorin 10/80.  7. Home nebulizer with albuterol.  8. Albuterol inhaler used rarely.   No medication allergy.   He continues to smoke despite counseling.   OBJECTIVE:  Weight 168 pounds, BP 132/70, pulse 70, room air saturation  99%.  HEART:  Sounds are regular without murmur or gallop.  LUNGS:  The lung fields are quiet without dullness or cough.  There is an odor of tobacco.  I do not find adenopathy.   IMPRESSION:  1. Obstructive sleep apnea for which he continues to use CPAP at 10      CWP every night.  He says this makes him sleep better and he is      comfortable with it.  2. At least mild chronic bronchitis with his chronic smoking history.  3. Coronary disease/myocardial infarction.   PLAN:  1. Smoking cessation was re-emphasized.  2. Chest x-ray (report now from today's film shows mild COPD with no      acute abnormality).  3.  Simple spirometry.  4. Schedule return in 6 months.  5. We refilled his albuterol inhaler.     Clinton D. Maple Hudson, MD, [name], FACP  Electronically Signed    CDY/MedQ  DD: 12/09/2006  DT: 12/10/2006  Job #: [id number]   cc:   [name] Picket A. Gerda Diss, MD  [name] Friends. Dietrich Pates, MD, [facility]

## 2010-11-04 ENCOUNTER — Encounter: Payer: Self-pay | Admitting: *Deleted

## 2010-11-06 ENCOUNTER — Encounter (INDEPENDENT_AMBULATORY_CARE_PROVIDER_SITE_OTHER): Payer: BC Managed Care – PPO | Admitting: Psychiatry

## 2010-11-06 DIAGNOSIS — F331 Major depressive disorder, recurrent, moderate: Secondary | ICD-10-CM

## 2010-11-19 ENCOUNTER — Encounter (HOSPITAL_COMMUNITY): Payer: BC Managed Care – PPO | Admitting: Psychology

## 2010-11-30 ENCOUNTER — Encounter (INDEPENDENT_AMBULATORY_CARE_PROVIDER_SITE_OTHER): Payer: BC Managed Care – PPO | Admitting: Psychology

## 2010-11-30 DIAGNOSIS — F411 Generalized anxiety disorder: Secondary | ICD-10-CM

## 2010-11-30 DIAGNOSIS — F332 Major depressive disorder, recurrent severe without psychotic features: Secondary | ICD-10-CM

## 2010-12-04 ENCOUNTER — Encounter (HOSPITAL_COMMUNITY): Payer: BC Managed Care – PPO | Admitting: Psychiatry

## 2010-12-04 ENCOUNTER — Other Ambulatory Visit: Payer: Self-pay | Admitting: Oncology

## 2010-12-04 ENCOUNTER — Ambulatory Visit (HOSPITAL_COMMUNITY)
Admission: RE | Admit: 2010-12-04 | Discharge: 2010-12-04 | Disposition: A | Discharge status: Home / Self Care | Payer: BC Managed Care – PPO | Source: Ambulatory Visit | Attending: Oncology | Admitting: Oncology

## 2010-12-04 ENCOUNTER — Encounter (HOSPITAL_COMMUNITY): Payer: BC Managed Care – PPO | Attending: Oncology

## 2010-12-04 ENCOUNTER — Other Ambulatory Visit (HOSPITAL_COMMUNITY): Payer: Self-pay | Admitting: Oncology

## 2010-12-04 DIAGNOSIS — R059 Cough, unspecified: Secondary | ICD-10-CM

## 2010-12-04 DIAGNOSIS — Z79899 Other long term (current) drug therapy: Secondary | ICD-10-CM | POA: Insufficient documentation

## 2010-12-04 DIAGNOSIS — D72829 Elevated white blood cell count, unspecified: Secondary | ICD-10-CM | POA: Insufficient documentation

## 2010-12-04 DIAGNOSIS — R05 Cough: Secondary | ICD-10-CM

## 2010-12-04 DIAGNOSIS — I10 Essential (primary) hypertension: Secondary | ICD-10-CM | POA: Insufficient documentation

## 2010-12-04 DIAGNOSIS — E7800 Pure hypercholesterolemia, unspecified: Secondary | ICD-10-CM | POA: Insufficient documentation

## 2010-12-04 LAB — COMPREHENSIVE METABOLIC PANEL
Alkaline Phosphatase: 100 U/L (ref 39–117)
BUN: 15 mg/dL (ref 6–23)
Calcium: 10.1 mg/dL (ref 8.4–10.5)
Creatinine, Ser: 0.7 mg/dL (ref 0.50–1.35)
GFR calc Af Amer: >60 mL/min (ref >60)
Glucose, Bld: 97 mg/dL (ref 70–99)
Total Protein: 7.5 g/dL (ref 6.0–8.3)

## 2010-12-04 LAB — URINALYSIS, ROUTINE W REFLEX MICROSCOPIC
Glucose, UA: NEGATIVE mg/dL
Leukocytes, UA: NEGATIVE
Protein, ur: NEGATIVE mg/dL
Specific Gravity, Urine: 1.01 (ref 1.005–1.030)
pH: 6 (ref 5.0–8.0)

## 2010-12-04 LAB — DIFFERENTIAL
Basophils Absolute: 0 10*3/uL (ref 0.0–0.1)
Basophils Relative: 0 % (ref 0–1)
Lymphocytes Relative: 17 % (ref 12–46)
Neutro Abs: 9.4 10*3/uL — ABNORMAL HIGH (ref 1.7–7.7)
Neutrophils Relative %: 78 % — ABNORMAL HIGH (ref 43–77)

## 2010-12-04 LAB — CBC
HCT: 46.9 % (ref 39.0–52.0)
Hemoglobin: 16.9 g/dL (ref 13.0–17.0)
RBC: 5.23 MIL/uL (ref 4.22–5.81)
WBC: 12 10*3/uL — ABNORMAL HIGH (ref 4.0–10.5)

## 2010-12-04 LAB — LACTATE DEHYDROGENASE: LDH: 160 U/L (ref 94–250)

## 2010-12-04 LAB — SEDIMENTATION RATE: Sed Rate: 2 mm/hr (ref 0–16)

## 2010-12-06 LAB — URINE CULTURE

## 2010-12-07 ENCOUNTER — Other Ambulatory Visit (HOSPITAL_COMMUNITY): Payer: Self-pay | Admitting: Oncology

## 2010-12-07 ENCOUNTER — Encounter (HOSPITAL_COMMUNITY): Payer: BC Managed Care – PPO

## 2010-12-07 DIAGNOSIS — D72829 Elevated white blood cell count, unspecified: Secondary | ICD-10-CM

## 2010-12-11 ENCOUNTER — Ambulatory Visit (HOSPITAL_COMMUNITY): Payer: BC Managed Care – PPO

## 2010-12-11 ENCOUNTER — Ambulatory Visit (HOSPITAL_COMMUNITY): Payer: BC Managed Care – PPO | Admitting: Oncology

## 2010-12-11 ENCOUNTER — Other Ambulatory Visit (HOSPITAL_COMMUNITY): Payer: Self-pay | Admitting: Oncology

## 2010-12-11 ENCOUNTER — Encounter (HOSPITAL_COMMUNITY): Payer: BC Managed Care – PPO

## 2010-12-11 DIAGNOSIS — D72829 Elevated white blood cell count, unspecified: Secondary | ICD-10-CM

## 2010-12-11 LAB — CBC
HCT: 45.3 % (ref 39.0–52.0)
Hemoglobin: 16 g/dL (ref 13.0–17.0)
RDW: 13.6 % (ref 11.5–15.5)
WBC: 11.2 10*3/uL — ABNORMAL HIGH (ref 4.0–10.5)

## 2010-12-18 ENCOUNTER — Encounter (HOSPITAL_COMMUNITY): Payer: BC Managed Care – PPO | Admitting: Psychology

## 2010-12-21 ENCOUNTER — Ambulatory Visit (INDEPENDENT_AMBULATORY_CARE_PROVIDER_SITE_OTHER): Payer: BC Managed Care – PPO | Admitting: Gastroenterology

## 2010-12-21 ENCOUNTER — Encounter: Payer: Self-pay | Admitting: Gastroenterology

## 2010-12-21 VITALS — BP 126/79 | HR 62 | Temp 97.3°F | Ht 70.0 in | Wt 157.0 lb

## 2010-12-21 DIAGNOSIS — R1310 Dysphagia, unspecified: Secondary | ICD-10-CM

## 2010-12-21 DIAGNOSIS — K529 Noninfective gastroenteritis and colitis, unspecified: Secondary | ICD-10-CM

## 2010-12-21 DIAGNOSIS — R630 Anorexia: Secondary | ICD-10-CM | POA: Insufficient documentation

## 2010-12-21 DIAGNOSIS — R1013 Epigastric pain: Secondary | ICD-10-CM

## 2010-12-21 DIAGNOSIS — D72829 Elevated white blood cell count, unspecified: Secondary | ICD-10-CM

## 2010-12-21 DIAGNOSIS — R634 Abnormal weight loss: Secondary | ICD-10-CM

## 2010-12-21 DIAGNOSIS — R197 Diarrhea, unspecified: Secondary | ICD-10-CM

## 2010-12-21 DIAGNOSIS — R1319 Other dysphagia: Secondary | ICD-10-CM

## 2010-12-21 DIAGNOSIS — R1314 Dysphagia, pharyngoesophageal phase: Secondary | ICD-10-CM

## 2010-12-21 LAB — HEPATIC FUNCTION PANEL
AST: 19 U/L (ref 0–37)
Alkaline Phosphatase: 88 U/L (ref 39–117)
Bilirubin, Direct: 0.1 mg/dL (ref 0.0–0.3)
Indirect Bilirubin: 0.5 mg/dL (ref 0.0–0.9)
Total Bilirubin: 0.6 mg/dL (ref 0.3–1.2)

## 2010-12-21 LAB — CBC WITH DIFFERENTIAL/PLATELET
Eosinophils Absolute: 0.1 10*3/uL (ref 0.0–0.7)
Hemoglobin: 17.5 g/dL — ABNORMAL HIGH (ref 13.0–17.0)
Lymphs Abs: 2 10*3/uL (ref 0.7–4.0)
MCH: 32.1 pg (ref 26.0–34.0)
Monocytes Relative: 5 % (ref 3–12)
Neutrophils Relative %: 72 % (ref 43–77)
RBC: 5.46 MIL/uL (ref 4.22–5.81)

## 2010-12-21 MED ORDER — ESOMEPRAZOLE MAGNESIUM 40 MG PO CPDR: 40.0000 mg | DELAYED_RELEASE_CAPSULE | Freq: Every day | ORAL | Status: DC

## 2010-12-21 NOTE — Assessment & Plan Note (Signed)
Refer to assessment and plan for epigastric pain. 

## 2010-12-21 NOTE — Assessment & Plan Note (Signed)
Chronic leukocytosis followed by hematology. GI workup as outlined above.

## 2010-12-21 NOTE — Progress Notes (Signed)
Primary Care Physician:  [name], MD, MD  Referring Physician: Dr. Garey Ham, MD  Primary Gastroenterologist:  Roetta Sessions, MD  Chief Complaint  Patient presents with  . Abdominal Pain    for last 7-8 months  . Diarrhea    HPI:  [name] is a 55 y.o. male here for evaluation of epigastric pain, chronic diarrhea, elevated white blood cell count. He was referred by Dr. Park Breed who was covering for Dr. Glenford Peers. Epigastric pain going on for 8 months. Very frequent. Eats only supper. PP diarrhea. Anorexia. PP diarrhea for couple of years. Out of work four years now, disability. Weight down, from 165 (8 pounds). According to our records, he weight 156 in 06/2009. Living on liquids, mostly tea. No melena, brbpr. No vomiting. Urinary hesitation. Prostate exam two months ago, Dr. [name]. No heartburn. Some solid food and pill dysphagia. Wash food down. Recently given pantoprazole to take 1 daily. Patient states he had samples but did not get prescription filled.  The last time we saw the patient was at time of colonoscopy in January 2011. He presented with clinical picture of ischemic colitis. Colonoscopy showed some mild inflammation of the sigmoid colon, biopsies were nonspecific and actually reported to be benign. He had a normal terminal ileum, tubular adenoma removed.  Labs from 12/04/2010, creatinine 0.7, calcium 10.1, total bilirubin 0.5, alkaline phosphatase 100, SGOT 17, SGPT 22, albumin 4, white blood cell count 12,000, hemoglobin 16.9, platelets 183,000, neutrophil 78%. Sedimentation rate 2.   Current Outpatient Prescriptions  Medication Sig Dispense Refill  . albuterol (PROVENTIL) (2.5 MG/3ML) 0.083% nebulizer solution Take 2.5 mg by nebulization every 6 (six) hours as needed.        Marland Kitchen albuterol-ipratropium (COMBIVENT) 18-103 MCG/ACT inhaler Inhale 2 puffs into the lungs every 6 (six) hours as needed.        . ALPRAZolam (XANAX) 1 MG tablet Take 1 mg by mouth at  bedtime as needed.        Marland Kitchen amLODipine (NORVASC) 5 MG tablet Take 5 mg by mouth daily.        Marland Kitchen aspirin 81 MG tablet Take 81 mg by mouth daily.        . budesonide-formoterol (SYMBICORT) 80-4.5 MCG/ACT inhaler Inhale 2 puffs into the lungs 2 (two) times daily.        . clonazePAM (KLONOPIN) 0.5 MG tablet Take 0.5 mg by mouth 3 (three) times daily as needed.       . Cyanocobalamin (VITAMIN B-12) 2000 MCG TBCR Take 1 tablet by mouth 1 dose over 46 hours.        Marland Kitchen FLUoxetine (PROZAC) 20 MG capsule Take 20 mg by mouth 2 (two) times daily. Take 2 caps at one time      . gabapentin (NEURONTIN) 300 MG capsule Take 300 mg by mouth 2 (two) times daily.       Marland Kitchen HYDROcodone-acetaminophen (VICODIN) 5-500 MG per tablet Take 1 tablet by mouth every 6 (six) hours as needed.        Marland Kitchen lisinopril (PRINIVIL,ZESTRIL) 20 MG tablet Take 20 mg by mouth daily.        . metoprolol succinate (TOPROL-XL) 25 MG 24 hr tablet Take 25 mg by mouth daily.        Marland Kitchen OLANZapine (ZYPREXA) 5 MG tablet Take 5 mg by mouth at bedtime.        . pravastatin (PRAVACHOL) 40 MG tablet Take 1 tablet (40 mg total) by mouth daily.  30  tablet  6  . Testosterone (ANDROGEL PUMP) 1.25 GM/ACT (1%) GEL Place onto the skin 4 (four) times daily.        Marland Kitchen esomeprazole (NEXIUM) 40 MG capsule Take 1 capsule (40 mg total) by mouth daily before breakfast.  30 capsule  11    Allergies as of 12/21/2010  . (No Known Allergies)    Past Medical History  Diagnosis Date  . ASCVD (arteriosclerotic cardiovascular disease)     inferior MI with circumflex PTCA in 1993;cath in 2000-50% OM 1&2,30% LAD ;2008-25% LAD, 80% nondominant right ,60% small OM 2,EF of 55% with severe basilar inferior hypokinesis; stress nuclear study in 04/2008 moderate inferior scar with peri-infarction ischemic  . Syncope   . Hyperlipidemia   . Hypertension   . Tobacco abuse     50 pack years continuing at one halp pack daily  . COPD (chronic obstructive pulmonary disease)     mild  ;excercise induced hypoxemia by cp stress test ;asthma ,bronchitis,  . Restless leg syndrome   . Sleep apnea   . Anxiety and depression   . GERD (gastroesophageal reflux disease)   . Inflammatory polyps of colon with rectal bleeding   . Carcinoma in situ of colon 2004    rectal polyp  . H. pylori infection 2004    treated    Past Surgical History  Procedure Date  . Colonoscopy w/ polypectomy 2004    rectal polyp with carcinoma in situ removed via colonoscopy  . Hand surgery     surgical intervention for injury of the fingers of the left hand many years ago  . Cholecystectomy 2004  . Colonoscopy 06/2009    normal terminal ileum, segmental mild inflammation of sigmoid colon (bx unremarkable), polyp, tubular adenoma  . Esophagogastroduodenoscopy 02/2009    query Barrett's but bx negative    Family History  Problem Relation Age of Onset  . Lung disease Father     deceased, black lung  . Heart disease Mother     blood clots  . Cancer Paternal Uncle     unknown type  . Colon cancer Neg Hx   . Cancer Maternal Aunt     unknown type  . Kidney failure Maternal Uncle     History   Social History  . Marital Status: Married    Spouse Name: N/A    Number of Children: N/A  . Years of Education: N/A   Occupational History  . disabled     [employer]   Social History Main Topics  . Smoking status: Current Everyday Smoker -- 0.5 packs/day    Types: Cigarettes  . Smokeless tobacco: Never Used  . Alcohol Use: Yes     Drinks a beer occasionally  . Drug Use: No  . Sexually Active: Not on file   Other Topics Concern  . Not on file   Social History Narrative   3 stepchildren      ROS:  General: Negative for fever, chills, fatigue, weakness. See history of present illness. Eyes: Negative for vision changes.  ENT: Negative for hoarseness, nasal congestion. CV: Negative for chest pain, angina, palpitations, peripheral edema. Complains of dyspnea on exertion.  Respiratory:  Complains of dyspnea on exertion, chronic cough. Negative for dyspnea at rest, sputum, wheezing.  GI: See history of present illness. GU:  Negative for dysuria, hematuria, urinary incontinence, urinary frequency, nocturnal urination. See history of present illness. MS: Negative for joint pain, low back pain.  Derm: Negative for rash or itching.  Neuro: Negative for weakness, abnormal sensation, seizure, frequent headaches, memory loss, confusion.  Psych: Negative for anxiety, depression, suicidal ideation, hallucinations.  Endo: Negative for unusual weight change.  Heme: Negative for bruising or bleeding. Allergy: Negative for rash or hives.    Physical Examination:  BP 126/79  Pulse 62  Temp(Src) 97.3 F (36.3 C) (Tympanic)  Ht 5\' 10"  (1.778 m)  Wt 157 lb (71.215 kg)  BMI 22.53 kg/m2   General: Well-nourished, well-developed in no acute distress.  Head: Normocephalic, atraumatic.   Eyes: Conjunctiva pink, no icterus. Mouth: Oropharyngeal mucosa moist and pink , no lesions erythema or exudate. Neck: Supple without thyromegaly, masses, or lymphadenopathy.  Lungs: Clear to auscultation bilaterally.  Heart: Regular rate and rhythm, no murmurs rubs or gallops.  Abdomen: Bowel sounds are normal, nontender, nondistended, no hepatosplenomegaly or masses, no abdominal bruits or    hernia , no rebound or guarding.   Extremities: No lower extremity edema.  Neuro: Alert and oriented x 4 , grossly normal neurologically.  Skin: Warm and dry, no rash or jaundice.   Psych: Alert and cooperative, normal mood and affect.

## 2010-12-21 NOTE — Progress Notes (Signed)
CC TO PCP °

## 2010-12-21 NOTE — Assessment & Plan Note (Signed)
Chronic diarrhea, postprandially. He avoids eating solid food because of severity of his symptoms. Differential diagnosis include mesenteric ischemia, celiac disease, irritable bowel syndrome, bile salt acid diarrhea, less likely IBD or malignancy. Recommend sigmoidoscopy with random biopsy as well as small bowel biopsy at time of EGD. Labs to be done as well. I have discussed the risks, alternatives, benefits with regards to but not limited to the risk of reaction to medication, bleeding, infection, perforation and the patient is agreeable to proceed. Written consent to be obtained.Marland Kitchen

## 2010-12-21 NOTE — Assessment & Plan Note (Signed)
Refer to assessment and plan for her epigastric pain.

## 2010-12-21 NOTE — Assessment & Plan Note (Signed)
Chronic epigastric pain associated with anorexia, weight loss. Not mentioned above, he takes aspirin daily but no other NSAIDs. Need to rule out peptic ulcer disease, gastritis. He has already had treatment for H. pylori in 2004 and subsequent normal EGD therefore making H. pylori gastritis less likely. Cannot rule out ischemic process which could explain his chronic diarrhea, weight loss, abdominal pain. Initially will check labs, schedule EGD with small bowel biopsies (rule out celiac), esophageal dilation for history of dysphagia. I have discussed the risks, alternatives, benefits with regards to but not limited to the risk of reaction to medication, bleeding, infection, perforation and the patient is agreeable to proceed. Written consent to be obtained. We did discuss use of conscious sedation versus deep sedation given polypharmacy however patient has had adequate sedation the past like to pursue conscious sedation at this time.

## 2010-12-28 NOTE — Progress Notes (Signed)
Cc to Dr. Mariel Sleet not sure who Dr. Park Breed is ?

## 2010-12-30 ENCOUNTER — Encounter (HOSPITAL_COMMUNITY)
Admission: RE | Disposition: A | Discharge status: Home / Self Care | Payer: Self-pay | Source: Ambulatory Visit | Attending: Internal Medicine

## 2010-12-30 ENCOUNTER — Ambulatory Visit (HOSPITAL_COMMUNITY)
Admission: RE | Admit: 2010-12-30 | Discharge: 2010-12-30 | Disposition: A | Discharge status: Home / Self Care | Payer: BC Managed Care – PPO | Source: Ambulatory Visit | Attending: Internal Medicine | Admitting: Internal Medicine

## 2010-12-30 ENCOUNTER — Ambulatory Visit (INDEPENDENT_AMBULATORY_CARE_PROVIDER_SITE_OTHER): Payer: BC Managed Care – PPO | Admitting: Internal Medicine

## 2010-12-30 ENCOUNTER — Other Ambulatory Visit: Payer: Self-pay | Admitting: Internal Medicine

## 2010-12-30 ENCOUNTER — Encounter: Payer: BC Managed Care – PPO | Admitting: Internal Medicine

## 2010-12-30 ENCOUNTER — Encounter (HOSPITAL_COMMUNITY): Payer: Self-pay | Admitting: *Deleted

## 2010-12-30 DIAGNOSIS — R197 Diarrhea, unspecified: Secondary | ICD-10-CM

## 2010-12-30 DIAGNOSIS — R1310 Dysphagia, unspecified: Secondary | ICD-10-CM | POA: Insufficient documentation

## 2010-12-30 DIAGNOSIS — R1013 Epigastric pain: Secondary | ICD-10-CM | POA: Insufficient documentation

## 2010-12-30 DIAGNOSIS — J449 Chronic obstructive pulmonary disease, unspecified: Secondary | ICD-10-CM | POA: Insufficient documentation

## 2010-12-30 DIAGNOSIS — R634 Abnormal weight loss: Secondary | ICD-10-CM | POA: Insufficient documentation

## 2010-12-30 DIAGNOSIS — Z79899 Other long term (current) drug therapy: Secondary | ICD-10-CM | POA: Insufficient documentation

## 2010-12-30 DIAGNOSIS — G4733 Obstructive sleep apnea (adult) (pediatric): Secondary | ICD-10-CM | POA: Insufficient documentation

## 2010-12-30 DIAGNOSIS — E785 Hyperlipidemia, unspecified: Secondary | ICD-10-CM | POA: Insufficient documentation

## 2010-12-30 DIAGNOSIS — R109 Unspecified abdominal pain: Secondary | ICD-10-CM

## 2010-12-30 DIAGNOSIS — K2970 Gastritis, unspecified, without bleeding: Secondary | ICD-10-CM

## 2010-12-30 DIAGNOSIS — Z7982 Long term (current) use of aspirin: Secondary | ICD-10-CM | POA: Insufficient documentation

## 2010-12-30 DIAGNOSIS — K259 Gastric ulcer, unspecified as acute or chronic, without hemorrhage or perforation: Secondary | ICD-10-CM | POA: Insufficient documentation

## 2010-12-30 DIAGNOSIS — J4489 Other specified chronic obstructive pulmonary disease: Secondary | ICD-10-CM | POA: Insufficient documentation

## 2010-12-30 DIAGNOSIS — K449 Diaphragmatic hernia without obstruction or gangrene: Secondary | ICD-10-CM | POA: Insufficient documentation

## 2010-12-30 DIAGNOSIS — I10 Essential (primary) hypertension: Secondary | ICD-10-CM | POA: Insufficient documentation

## 2010-12-30 HISTORY — PX: FLEXIBLE SIGMOIDOSCOPY: SHX5431

## 2010-12-30 HISTORY — DX: Gastritis, unspecified, without bleeding: K29.70

## 2010-12-30 HISTORY — PX: ESOPHAGOGASTRODUODENOSCOPY: SHX5428

## 2010-12-30 SURGERY — EGD (ESOPHAGOGASTRODUODENOSCOPY)
Anesthesia: Moderate Sedation

## 2010-12-30 MED ORDER — BUTAMBEN-TETRACAINE-BENZOCAINE 2-2-14 % EX AERO
INHALATION_SPRAY | CUTANEOUS | Status: DC | PRN
  Administered 2010-12-30: 2 via TOPICAL

## 2010-12-30 MED ORDER — MEPERIDINE HCL 100 MG/ML IJ SOLN
INTRAMUSCULAR | Status: DC | PRN
  Administered 2010-12-30 (×2): 50 mg via INTRAVENOUS

## 2010-12-30 MED ORDER — MEPERIDINE HCL 100 MG/ML IJ SOLN
INTRAMUSCULAR | Status: AC
  Filled 2010-12-30: qty 2

## 2010-12-30 MED ORDER — SODIUM CHLORIDE 0.45 % IV SOLN
Freq: Once | INTRAVENOUS | Status: AC
  Administered 2010-12-30: 11:00:00 via INTRAVENOUS

## 2010-12-30 MED ORDER — MIDAZOLAM HCL 5 MG/5ML IJ SOLN
INTRAMUSCULAR | Status: AC
  Filled 2010-12-30: qty 10

## 2010-12-30 MED ORDER — MIDAZOLAM HCL 5 MG/5ML IJ SOLN
INTRAMUSCULAR | Status: DC | PRN
  Administered 2010-12-30: 2 mg via INTRAVENOUS
  Administered 2010-12-30: 1 mg via INTRAVENOUS
  Administered 2010-12-30: 2 mg via INTRAVENOUS

## 2010-12-30 NOTE — H&P (Signed)
[name]   12/21/2010 8:30 AM Office Visit  MRN: [id number]   Description: 55 year old male  Provider: Tana Coast, PA  Department: [facility]        Diagnoses     Epigastric pain   - Primary    789.06    Chronic diarrhea     787.91    Weight loss, abnormal     783.21    Anorexia     783.0    Esophageal dysphagia     787.24    Leukocytosis     288.60      Reason for Visit     Abdominal Pain    for last 7-8 months    Diarrhea       Reason For Visit History Recorded        Current Vitals       Recorded User        12/21/2010  8:23 AM  Trudee Kuster, LPN           BP Pulse Temp (Src) Resp Ht Wt    126/79  62  97.3 F (36.3 C) (Tympanic)  N/A  5\' 10"  (1.778 m)  71.215 kg (157 lb)       BMI SpO2 PF    22.53 kg/m2  N/A  N/A          Progress Notes     Tana Coast, PA  12/21/2010 10:02 AM  Signed Primary Care Physician:  [name], MD, MD   Referring Physician: Dr. Garey Ham, MD   Primary Gastroenterologist:  Roetta Sessions, MD    Chief Complaint   Patient presents with   .  Abdominal Pain       for last 7-8 months   .  Diarrhea      HPI:  [name] is a 55 y.o. male here for evaluation of epigastric pain, chronic diarrhea, elevated white blood cell count. He was referred by Dr. Park Breed who was covering for Dr. Glenford Peers. Epigastric pain going on for 8 months. Very frequent. Eats only supper. PP diarrhea. Anorexia. PP diarrhea for couple of years. Out of work four years now, disability. Weight down, from 165 (8 pounds). According to our records, he weight 156 in 06/2009. Living on liquids, mostly tea. No melena, brbpr. No vomiting. Urinary hesitation. Prostate exam two months ago, Dr. [name]. No heartburn. Some solid food and pill dysphagia. Wash food down. Recently given pantoprazole to take 1 daily. Patient states he had samples but did not get prescription filled.   The last time we saw the patient was at time of  colonoscopy in January 2011. He presented with clinical picture of ischemic colitis. Colonoscopy showed some mild inflammation of the sigmoid colon, biopsies were nonspecific and actually reported to be benign. He had a normal terminal ileum, tubular adenoma removed.   Labs from 12/04/2010, creatinine 0.7, calcium 10.1, total bilirubin 0.5, alkaline phosphatase 100, SGOT 17, SGPT 22, albumin 4, white blood cell count 12,000, hemoglobin 16.9, platelets 183,000, neutrophil 78%. Sedimentation rate 2.      Current Outpatient Prescriptions   Medication  Sig  Dispense  Refill   .  albuterol (PROVENTIL) (2.5 MG/3ML) 0.083% nebulizer solution  Take 2.5 mg by nebulization every 6 (six) hours as needed.           Marland Kitchen  albuterol-ipratropium (COMBIVENT) 18-103 MCG/ACT inhaler  Inhale 2 puffs into the lungs every 6 (six) hours as needed.           Marland Kitchen  ALPRAZolam (XANAX) 1 MG tablet  Take 1 mg by mouth at bedtime as needed.           Marland Kitchen  amLODipine (NORVASC) 5 MG tablet  Take 5 mg by mouth daily.           Marland Kitchen  aspirin 81 MG tablet  Take 81 mg by mouth daily.           .  budesonide-formoterol (SYMBICORT) 80-4.5 MCG/ACT inhaler  Inhale 2 puffs into the lungs 2 (two) times daily.           .  clonazePAM (KLONOPIN) 0.5 MG tablet  Take 0.5 mg by mouth 3 (three) times daily as needed.          .  Cyanocobalamin (VITAMIN B-12) 2000 MCG TBCR  Take 1 tablet by mouth 1 dose over 46 hours.           Marland Kitchen  FLUoxetine (PROZAC) 20 MG capsule  Take 20 mg by mouth 2 (two) times daily. Take 2 caps at one time         .  gabapentin (NEURONTIN) 300 MG capsule  Take 300 mg by mouth 2 (two) times daily.          Marland Kitchen  HYDROcodone-acetaminophen (VICODIN) 5-500 MG per tablet  Take 1 tablet by mouth every 6 (six) hours as needed.           Marland Kitchen  lisinopril (PRINIVIL,ZESTRIL) 20 MG tablet  Take 20 mg by mouth daily.           .  metoprolol succinate (TOPROL-XL) 25 MG 24 hr tablet  Take 25 mg by mouth daily.           Marland Kitchen  OLANZapine (ZYPREXA) 5 MG  tablet  Take 5 mg by mouth at bedtime.           .  pravastatin (PRAVACHOL) 40 MG tablet  Take 1 tablet (40 mg total) by mouth daily.   30 tablet   6   .  Testosterone (ANDROGEL PUMP) 1.25 GM/ACT (1%) GEL  Place onto the skin 4 (four) times daily.           Marland Kitchen  esomeprazole (NEXIUM) 40 MG capsule  Take 1 capsule (40 mg total) by mouth daily before breakfast.   30 capsule   11       Allergies as of 12/21/2010   .  (No Known Allergies)       Past Medical History   Diagnosis  Date   .  ASCVD (arteriosclerotic cardiovascular disease)         inferior MI with circumflex PTCA in 1993;cath in 2000-50% OM 1&2,30% LAD ;2008-25% LAD, 80% nondominant right ,60% small OM 2,EF of 55% with severe basilar inferior hypokinesis; stress nuclear study in 04/2008 moderate inferior scar with peri-infarction ischemic   .  Syncope     .  Hyperlipidemia     .  Hypertension     .  Tobacco abuse         50 pack years continuing at one halp pack daily   .  COPD (chronic obstructive pulmonary disease)         mild ;excercise induced hypoxemia by cp stress test ;asthma ,bronchitis,   .  Restless leg syndrome     .  Sleep apnea     .  Anxiety and depression     .  GERD (gastroesophageal reflux disease)     .  Inflammatory polyps  of colon with rectal bleeding     .  Carcinoma in situ of colon  2004       rectal polyp   .  H. pylori infection  2004       treated       Past Surgical History   Procedure  Date   .  Colonoscopy w/ polypectomy  2004       rectal polyp with carcinoma in situ removed via colonoscopy   .  Hand surgery         surgical intervention for injury of the fingers of the left hand many years ago   .  Cholecystectomy  2004   .  Colonoscopy  06/2009       normal terminal ileum, segmental mild inflammation of sigmoid colon (bx unremarkable), polyp, tubular adenoma   .  Esophagogastroduodenoscopy  02/2009       query Barrett's but bx negative       Family History   Problem  Relation  Age of  Onset   .  Lung disease  Father         deceased, black lung   .  Heart disease  Mother         blood clots   .  Cancer  Paternal Uncle         unknown type   .  Colon cancer  Neg Hx     .  Cancer  Maternal Aunt         unknown type   .  Kidney failure  Maternal Uncle         History       Social History   .  Marital Status:  Married       Spouse Name:  N/A       Number of Children:  N/A   .  Years of Education:  N/A       Occupational History   .  disabled         [employer]       Social History Main Topics   .  Smoking status:  Current Everyday Smoker -- 0.5 packs/day       Types:  Cigarettes   .  Smokeless tobacco:  Never Used   .  Alcohol Use:  Yes         Drinks a beer occasionally   .  Drug Use:  No   .  Sexually Active:  Not on file       Other Topics  Concern   .  Not on file       Social History Narrative     3 stepchildren        ROS:   General: Negative for fever, chills, fatigue, weakness. See history of present illness. Eyes: Negative for vision changes.   ENT: Negative for hoarseness, nasal congestion. CV: Negative for chest pain, angina, palpitations, peripheral edema. Complains of dyspnea on exertion.   Respiratory: Complains of dyspnea on exertion, chronic cough. Negative for dyspnea at rest, sputum, wheezing.   GI: See history of present illness. GU:  Negative for dysuria, hematuria, urinary incontinence, urinary frequency, nocturnal urination. See history of present illness. MS: Negative for joint pain, low back pain.   Derm: Negative for rash or itching.   Neuro: Negative for weakness, abnormal sensation, seizure, frequent headaches, memory loss, confusion.   Psych: Negative for anxiety, depression, suicidal ideation, hallucinations.   Endo: Negative for unusual weight change.  Heme: Negative for bruising or bleeding. Allergy: Negative for rash or hives.     Physical Examination:   BP 126/79  Pulse 62  Temp(Src) 97.3 F (36.3 C)  (Tympanic)  Ht 5\' 10"  (1.778 m)  Wt 157 lb (71.215 kg)  BMI 22.53 kg/m2    General: Well-nourished, well-developed in no acute distress.   Head: Normocephalic, atraumatic.    Eyes: Conjunctiva pink, no icterus. Mouth: Oropharyngeal mucosa moist and pink , no lesions erythema or exudate. Neck: Supple without thyromegaly, masses, or lymphadenopathy.   Lungs: Clear to auscultation bilaterally.   Heart: Regular rate and rhythm, no murmurs rubs or gallops.   Abdomen: Bowel sounds are normal, nontender, nondistended, no hepatosplenomegaly or masses, no abdominal bruits or    hernia , no rebound or guarding.    Extremities: No lower extremity edema.   Neuro: Alert and oriented x 4 , grossly normal neurologically.   Skin: Warm and dry, no rash or jaundice.    Psych: Alert and cooperative, normal mood and affect.          Glendora Score  12/21/2010 10:35 AM  Signed CC TO PCP  Marliss Czar A Watson  12/28/2010  4:20 PM  Signed Cc to Dr. Mariel Sleet not sure who Dr. Park Breed is ?        Epigastric pain - Tana Coast, PA  12/21/2010  9:54 AM  Signed Chronic epigastric pain associated with anorexia, weight loss. Not mentioned above, he takes aspirin daily but no other NSAIDs. Need to rule out peptic ulcer disease, gastritis. He has already had treatment for H. pylori in 2004 and subsequent normal EGD therefore making H. pylori gastritis less likely. Cannot rule out ischemic process which could explain his chronic diarrhea, weight loss, abdominal pain. Initially will check labs, schedule EGD with small bowel biopsies (rule out celiac), esophageal dilation for history of dysphagia. I have discussed the risks, alternatives, benefits with regards to but not limited to the risk of reaction to medication, bleeding, infection, perforation and the patient is agreeable to proceed. Written consent to be obtained. We did discuss use of conscious sedation versus deep sedation given polypharmacy however patient has had adequate  sedation the past like to pursue conscious sedation at this time.  Esophageal dysphagia - Tana Coast, PA  12/21/2010  9:54 AM  Signed Refer to assessment and plan for her epigastric pain.  Anorexia - Tana Coast, PA  12/21/2010  9:54 AM  Signed Refer to assessment and plan for epigastric pain.  Weight loss, abnormal - Tana Coast, PA  12/21/2010  9:54 AM  Signed Refer to assessment and plan for epigastric pain.  Chronic diarrhea - Tana Coast, PA  12/21/2010  9:56 AM  Signed Chronic diarrhea, postprandially. He avoids eating solid food because of severity of his symptoms. Differential diagnosis include mesenteric ischemia, celiac disease, irritable bowel syndrome, bile salt acid diarrhea, less likely IBD or malignancy. Recommend sigmoidoscopy with random biopsy as well as small bowel biopsy at time of EGD. Labs to be done as well. I have discussed the risks, alternatives, benefits with regards to but not limited to the risk of reaction to medication, bleeding, infection, perforation and the patient is agreeable to proceed. Written consent to be obtained..  Leukocytosis - Tana Coast, PA  12/21/2010  9:58 AM  Signed Chronic leukocytosis followed by hematology. GI workup as outlined above.    I have seen the patient prior to the procedure(s) today and reviewed the history and physical / consultation  from 12/21/10.  There have been no changes. After consideration of the risks, benefits, alternatives and imponderables, the patient has consented to the procedure(s).

## 2010-12-31 ENCOUNTER — Telehealth: Payer: Self-pay

## 2010-12-31 NOTE — Telephone Encounter (Signed)
Patient called because the drug store would not fill him dexilant 60 mg. I called the drug store to find out what was going on and they said he needed a Prior Authorization from his insurance company. I am working on that now so I will leave him some sample up front for now.

## 2010-12-31 NOTE — Telephone Encounter (Signed)
Noted  

## 2011-01-01 ENCOUNTER — Encounter (HOSPITAL_COMMUNITY): Payer: BC Managed Care – PPO | Admitting: Psychiatry

## 2011-01-06 ENCOUNTER — Other Ambulatory Visit: Payer: Self-pay | Admitting: Cardiology

## 2011-01-07 ENCOUNTER — Other Ambulatory Visit (HOSPITAL_COMMUNITY): Payer: Self-pay | Admitting: Oncology

## 2011-01-07 DIAGNOSIS — D72829 Elevated white blood cell count, unspecified: Secondary | ICD-10-CM

## 2011-01-08 ENCOUNTER — Encounter (HOSPITAL_COMMUNITY): Payer: Self-pay | Admitting: Internal Medicine

## 2011-01-08 ENCOUNTER — Encounter (HOSPITAL_COMMUNITY): Payer: BC Managed Care – PPO | Attending: Oncology

## 2011-01-08 DIAGNOSIS — D72829 Elevated white blood cell count, unspecified: Secondary | ICD-10-CM | POA: Insufficient documentation

## 2011-01-08 LAB — CBC
MCH: 32.4 pg (ref 26.0–34.0)
MCV: 91.2 fL (ref 78.0–100.0)
Platelets: 193 10*3/uL (ref 150–400)
RDW: 13.8 % (ref 11.5–15.5)
WBC: 12.6 10*3/uL — ABNORMAL HIGH (ref 4.0–10.5)

## 2011-01-08 NOTE — Progress Notes (Signed)
Labs drawn today for cbc 

## 2011-01-26 ENCOUNTER — Encounter: Payer: Self-pay | Admitting: Internal Medicine

## 2011-02-05 ENCOUNTER — Encounter (HOSPITAL_COMMUNITY): Payer: BC Managed Care – PPO | Attending: Oncology

## 2011-02-05 DIAGNOSIS — Z79899 Other long term (current) drug therapy: Secondary | ICD-10-CM | POA: Insufficient documentation

## 2011-02-05 DIAGNOSIS — E7800 Pure hypercholesterolemia, unspecified: Secondary | ICD-10-CM | POA: Insufficient documentation

## 2011-02-05 DIAGNOSIS — I10 Essential (primary) hypertension: Secondary | ICD-10-CM | POA: Insufficient documentation

## 2011-02-05 DIAGNOSIS — D72829 Elevated white blood cell count, unspecified: Secondary | ICD-10-CM | POA: Insufficient documentation

## 2011-02-05 LAB — CBC
Hemoglobin: 16 g/dL (ref 13.0–17.0)
MCHC: 36 g/dL (ref 30.0–36.0)
RDW: 13.2 % (ref 11.5–15.5)

## 2011-02-05 NOTE — Progress Notes (Signed)
Labs drawn today for cbc 

## 2011-02-16 ENCOUNTER — Encounter: Payer: Self-pay | Admitting: Internal Medicine

## 2011-02-16 NOTE — Progress Notes (Signed)
  I reviewed chart. Patient needs followup with [place] to assess weight loss and his GI symptoms.  Please make contact with him and get him an appointment to see [place]. Also, cannot find documentation that we've notified him of biopsy results. Biopsies of stomach and small bowel demonstrated only very minimal inflammation of the stomach which is likely not clinically significant.

## 2011-02-18 ENCOUNTER — Encounter: Payer: Self-pay | Admitting: Internal Medicine

## 2011-02-18 NOTE — Progress Notes (Signed)
Tried to call pt- LM with male to return call. 

## 2011-02-18 NOTE — Progress Notes (Signed)
Pt is aware of OV for 9/20 at 1030 am with [facility] and appt card was mailed

## 2011-02-18 NOTE — Progress Notes (Signed)
Pt aware, please schedule follow up appt. thanks

## 2011-03-04 ENCOUNTER — Encounter: Payer: Self-pay | Admitting: Gastroenterology

## 2011-03-04 ENCOUNTER — Ambulatory Visit (INDEPENDENT_AMBULATORY_CARE_PROVIDER_SITE_OTHER): Payer: BC Managed Care – PPO | Admitting: Urgent Care

## 2011-03-04 DIAGNOSIS — R1013 Epigastric pain: Secondary | ICD-10-CM

## 2011-03-04 DIAGNOSIS — R197 Diarrhea, unspecified: Secondary | ICD-10-CM

## 2011-03-04 DIAGNOSIS — K529 Noninfective gastroenteritis and colitis, unspecified: Secondary | ICD-10-CM

## 2011-03-04 DIAGNOSIS — R634 Abnormal weight loss: Secondary | ICD-10-CM

## 2011-03-04 NOTE — Assessment & Plan Note (Signed)
See Diarrhea CT a/p w/ IV/oral contrast

## 2011-03-04 NOTE — Progress Notes (Signed)
Primary Care Physician:  [name], MD, MD Referring Physician: Dr. Garey Ham, MD Primary Gastroenterologist:  Roetta Sessions, MD  Chief Complaint  Patient presents with  . Follow-up    upset stomach  . Nausea    not wanting to eat/only eat one meal    HPI:  [name] is a 55 y.o. male here for follow up epigastric pain, chronic diarrhea, leukocytosis. Had wt loss 8#, has gained most of that back, despite continued problems.  Epigastric pain for 1 yr.  Reports having to run to [facility] w/urgency every time he eats.  Only eats 1 meal per day.  Chronic nausea without vomiting.  Denies NSAIDs except ASA 81mg  dsily.  +chills, no fever.  Denies heartburn or indigestion.  Denies rectal bleeding or melena.  Usually four large loose watery stools per day.  Has been seeing Dr Welton Flakes for leukocytosis.  Flex sig, colonoscopy, EGD w/ small bowel bx  As below.  Negative TTG IgA & IgA.  Lab Summary Latest Ref Rng 02/05/2011 01/08/2011 12/21/2010  Hemoglobin 13.0 - 17.0 g/dL 16.1 09.6 04.5 (H)  Hematocrit 39.0 - 52.0 % 44.5 47.6 50.0  White count 4.0 - 10.5 K/uL 9.0 12.6 (H) 9.0  Platelet count 150 - 400 K/uL 187 193 187  Sodium 135 - 145 mEq/L (None) (None) (None)  Potassium 3.5 - 5.1 mEq/L (None) (None) (None)  Calcium 8.4 - 10.5 mg/dL (None) (None) (None)  Phosphorus  (None) (None) (None)  Creatinine 0.50 - 1.35 mg/dL (None) (None) (None)  AST 0 - 37 U/L (None) (None) 19  Alk Phos 39 - 117 U/L (None) (None) 88  Bilirubin 0.3 - 1.2 mg/dL (None) (None) 0.6  Glucose 70 - 99 mg/dL (None) (None) (None)  Cholesterol  (None) (None) (None)  HDL cholesterol  (None) (None) (None)  Triglycerides  (None) (None) (None)  LDL calc  (None) (None) (None)  LDL direct  (None) (None) (None)  Total protein 6.0 - 8.3 g/dL (None) (None) 7.2  Albumin 3.5 - 5.2 g/dL (None) (None) 4.6   Lab Summary Latest Ref Rng 12/11/2010 12/04/2010  Hemoglobin 13.0 - 17.0 g/dL 40.9 81.1  Hematocrit 91.4 - 52.0 % 45.3 46.9  White  count 4.0 - 10.5 K/uL 11.2 (H) 12.0 (H)  Platelet count 150 - 400 K/uL 198 183  Sodium 135 - 145 mEq/L (None) 138  Potassium 3.5 - 5.1 mEq/L (None) 4.3  Calcium 8.4 - 10.5 mg/dL (None) 78.2  Phosphorus  (None) (None)  Creatinine 0.50 - 1.35 mg/dL (None) 9.56  AST 0 - 37 U/L (None) 17  Alk Phos 39 - 117 U/L (None) 100  Bilirubin 0.3 - 1.2 mg/dL (None) 0.5  Glucose 70 - 99 mg/dL (None) 97  Cholesterol  (None) (None)  HDL cholesterol  (None) (None)  Triglycerides  (None) (None)  LDL calc  (None) (None)  LDL direct  (None) (None)  Total protein 6.0 - 8.3 g/dL (None) 7.5  Albumin 3.5 - 5.2 g/dL (None) 4.0    Current Outpatient Prescriptions  Medication Sig Dispense Refill  . albuterol (PROVENTIL) (2.5 MG/3ML) 0.083% nebulizer solution Take 2.5 mg by nebulization every 6 (six) hours as needed.        Marland Kitchen albuterol-ipratropium (COMBIVENT) 18-103 MCG/ACT inhaler Inhale 2 puffs into the lungs every 6 (six) hours as needed.        . ALPRAZolam (XANAX) 1 MG tablet Take 1 mg by mouth at bedtime as needed.        Marland Kitchen amLODipine (NORVASC) 5 MG  tablet Take 1 tablet (5 mg total) by mouth daily.  30 tablet  6  . aspirin 81 MG tablet Take 81 mg by mouth daily.        . budesonide-formoterol (SYMBICORT) 80-4.5 MCG/ACT inhaler Inhale 2 puffs into the lungs 2 (two) times daily.        . clonazePAM (KLONOPIN) 0.5 MG tablet Take 0.5 mg by mouth 3 (three) times daily as needed.       . Cyanocobalamin (VITAMIN B-12) 2000 MCG TBCR Take 1 tablet by mouth 1 dose over 46 hours.        Marland Kitchen FLUoxetine (PROZAC) 20 MG capsule Take 20 mg by mouth 2 (two) times daily. Take 2 caps at one time      . gabapentin (NEURONTIN) 300 MG capsule Take 300 mg by mouth 2 (two) times daily.       Marland Kitchen HYDROcodone-acetaminophen (LORTAB) 7.5-500 MG per tablet Take 1 tablet by mouth every 6 (six) hours as needed.        Marland Kitchen lisinopril (PRINIVIL,ZESTRIL) 20 MG tablet Take 20 mg by mouth daily.        . metoprolol succinate (TOPROL-XL) 25 MG 24  hr tablet Take 1 tablet (25 mg total) by mouth daily.  30 tablet  6  . OLANZapine (ZYPREXA) 5 MG tablet Take 5 mg by mouth at bedtime.        . pravastatin (PRAVACHOL) 40 MG tablet Take 1 tablet (40 mg total) by mouth daily.  30 tablet  6  . Testosterone (ANDROGEL PUMP) 1.25 GM/ACT (1%) GEL Place onto the skin 4 (four) times daily.        Marland Kitchen HYDROcodone-acetaminophen (VICODIN) 5-500 MG per tablet Take 1 tablet by mouth every 6 (six) hours as needed.         Allergies as of 03/04/2011  . (No Known Allergies)    Past Medical History  Diagnosis Date  . ASCVD (arteriosclerotic cardiovascular disease)     inferior MI with circumflex PTCA in 1993;cath in 2000-50% OM 1&2,30% LAD ;2008-25% LAD, 80% nondominant right ,60% small OM 2,EF of 55% with severe basilar inferior hypokinesis; stress nuclear study in 04/2008 moderate inferior scar with peri-infarction ischemic  . Syncope   . Hyperlipidemia   . Hypertension   . Tobacco abuse     50 pack years continuing at one halp pack daily  . COPD (chronic obstructive pulmonary disease)     mild ;excercise induced hypoxemia by cp stress test ;asthma ,bronchitis,  . Restless leg syndrome   . Anxiety and depression   . GERD (gastroesophageal reflux disease)   . Inflammatory polyps of colon with rectal bleeding   . Carcinoma in situ of colon 2004    rectal polyp  . H. pylori infection 2004    treated  . Sleep apnea     does not use CPAP  . Gastritis 12/30/10    EGD Dr Jena Gauss  . Colitis, ischemic 2011  . Tubular adenoma of colon 06/2009    Colonosocpy Dr Jena Gauss  . Leukocytosis     Dr Shon Baton    Past Surgical History  Procedure Date  . Colonoscopy w/ polypectomy 2004    rectal polyp with carcinoma in situ removed via colonoscopy  . Hand surgery     surgical intervention for injury of the fingers of the left hand many years ago  . Cholecystectomy 2004  . Colonoscopy 06/2009    normal terminal ileum, segmental mild inflammation of sigmoid  colon (bx unremarkable),  polyp, tubular adenoma  . Esophagogastroduodenoscopy 02/2009    query Barrett's but bx negative  . Esophagogastroduodenoscopy 12/30/2010    [name],gastritis, dilated 56F, sm HH, 1 small ulcer, Duodenal erosions, benign bx  . Flexible sigmoidoscopy 12/30/2010     [name],; internal hemorrhoids, anal papilla    Family History  Problem Relation Age of Onset  . Lung disease Father     deceased, black lung  . Heart disease Mother     blood clots  . Cancer Paternal Uncle     unknown type  . Colon cancer Neg Hx   . Cancer Maternal Aunt     unknown type  . Kidney failure Maternal Uncle     History   Social History  . Marital Status: Married    Spouse Name: N/A    Number of Children: N/A  . Years of Education: N/A   Occupational History  . disabled     [employer]   Social History Main Topics  . Smoking status: Current Everyday Smoker -- 0.5 packs/day    Types: Cigarettes  . Smokeless tobacco: Never Used  . Alcohol Use: 1.2 oz/week    2 Cans of beer per week     Drinks a beer occasionally  . Drug Use: No  . Sexually Active: Not on file   Other Topics Concern  . Not on file   Social History Narrative   3 stepchildren  Review of Systems: Gen: See HPI CV: Denies chest pain, angina, palpitations, syncope, orthopnea, PND, peripheral edema, and claudication. Resp: Denies dyspnea at rest, dyspnea with exercise, cough, sputum, wheezing, coughing up blood, and pleurisy. GI: Denies vomiting blood, jaundice, and fecal incontinence.   Denies dysphagia or odynophagia. Derm: Denies rash, itching, dry skin, hives, moles, warts, or unhealing ulcers.  Psych: Denies depression, anxiety, memory loss, suicidal ideation, hallucinations, paranoia, and confusion. Heme: Denies bruising, bleeding, and enlarged lymph nodes.  Physical Exam: BP 128/76  Pulse 63  Temp(Src) 98.2 F (36.8 C) (Temporal)  Ht 5\' 10"  (1.778 m)  Wt 167 lb (75.751 kg)  BMI 23.96  kg/m2 General:   Alert,  Well-developed, well-nourished, pleasant and cooperative in NAD Head:  Normocephalic and atraumatic. Eyes:  Sclera clear, no icterus.   Conjunctiva pink. Mouth:  No deformity or lesions, OP pink/moist. Neck:  Supple; no masses or thyromegaly. Heart:  Regular rate and rhythm; no murmurs, clicks, rubs,  or gallops. Abdomen:  Soft and nondistended. +epigastric tenderness on deep palpation.  No masses, hepatosplenomegaly or hernias noted. Normal bowel sounds, without guarding, and without rebound.   Msk:  Symmetrical without gross deformities. Normal posture. Pulses:  Normal pulses noted. Extremities:  Without clubbing or edema. Neurologic:  Alert and  oriented x4;  grossly normal neurologically. Skin:  Intact without significant lesions or rashes. Cervical Nodes:  No significant cervical adenopathy. Psych:  Alert and cooperative. Normal mood and affect.

## 2011-03-04 NOTE — Patient Instructions (Signed)
ALIGN once daily Return stools to lab ASAP I will call w/ CT report Chronic Diarrhea Diarrhea is loose, watery stools. Having diarrhea means passing loose stools 3 or more times a day. Diarrhea that lasts longer than 4 weeks is considered long-lasting (chronic). Symptoms of chronic diarrhea may be continual or may come and go. People of all ages can get diarrhea. Body fluid loss (dehydration) may occur as a result of diarrhea. This means the body does not have as many fluids and salts (electrolytes) as it needs. CAUSES There are many causes of chronic diarrhea. Causes may be different for children and adults. The various causes can be grouped into 2 categories: diarrhea caused by an infection and diarrhea not caused by an infection. Sometimes, the cause is unknown. Diarrhea caused by an infection may result from:  Parasites.   Bacteria.   Viral infections.  Diarrhea not caused by an infection may result from:  Irritable bowel syndrome.   Reaction to medicines, such as antibiotics, cancer drugs, blood pressure medicines, and antacids.   Intestinal disease (Crohn's disease, ulcerative colitis, celiac disease).   Food allergies or sensitivity to additives (fructose, lactose, sugar substitutes).   Tumors.   Diabetes, thyroid disease, and other endocrine diseases.   Reduced blood flow to the intestine.   Previous surgery or radiation of the abdomen or gastrointestinal tract.  Risk factors for chronic diarrhea include:  Having a severely weakened immune system, such as from HIV/AIDS.   Taking certain types of cancer-fighting drugs (chemotherapy) or other medicines.   A recent organ transplant.   Having a portion of the stomach removed.   Traveling to countries where food and water supplies are often contaminated.  SYMPTOMS In addition to frequent, loose stools, diarrhea may cause:  Cramping.   Abdominal pain.   Nausea.   Urgent need to use the bathroom, or loss of bowel  control.  If dehydration occurs, problems include:  Thirst.   Less frequent urination.   Dark urine.   Dry skin.   Fatigue.   Dizziness.  Infections that cause diarrhea may also cause a fever, chills, or bloody stools. DIAGNOSIS Diagnosis may be difficult. Your caregiver must take a careful history and perform a physical exam. Tests given are based on your symptoms and history. Tests may include:  Blood or stool tests, in which 3 or more stool samples may be examined. Stool cultures may be used to test for bacteria or parasites.   X-rays.   A procedure in which a thin tube is inserted into the mouth or rectum (endoscopy). This allows the caregiver to look inside the intestine.  TREATMENT  Diarrhea caused by an infection can often be treated with antibiotics.   Diarrhea not caused by an infection is more difficult to diagnose and treat. Long-term medicine use or surgery may be required. Specific treatment should be discussed with your caregiver.   If the cause cannot determined, treatment to relieve symptoms includes:   Preventing dehydration. Serious health problems can occur if you do not maintain proper fluid levels. Many oral rehydration solutions (ORS) are available at drug stores. Ask your caregiver what product is best for you.   Not drinking beverages that contain caffeine (tea, coffee, soft drinks).   Not drinking alcohol. It causes dehydration.   Not relying on sports drinks and broths alone to maintain proper fluid levels. They should not be used to prevent severe dehydration.   Maintaining well-balanced nutrition. This may help you recover faster.  PREVENTION  Drink clean or purified water.   Use proper food handling techniques.   Maintain proper hand-washing habits.  HOME CARE INSTRUCTIONS  Avoid:   Caffeine.   Greasy foods.   High fiber.   If you have problems digesting lactose during or after an episode of diarrhea, you might want to try yogurt.  Yogurt is often better tolerated, because it has less lactose than milk. Yogurt with active, live bacterial cultures may even help you recover faster.  SEEK MEDICAL CARE IF: The person with diarrhea is an otherwise healthy adult and has:  Signs of dehydration.   Diarrhea for more than 2 days.   Severe pain in the abdomen or rectum.   An oral temperature above 101.   Stools containing blood or pus.   Stools that are black and tarry.  SEEK IMMEDIATE MEDICAL CARE IF: The person with diarrhea is a child, elderly person, or has a weakened immune system and has:  Signs of dehydration.   Diarrhea for more than 1 day.   Severe pain in the abdomen or rectum.   An oral temperature above 101, not controlled by medicine.   Stools containing blood or pus.   Stools that are black and tarry.  Document Released: 08/21/2003 Document Re-Released: 11/18/2009 [facility] Patient Information 2011 [place], [place].

## 2011-03-04 NOTE — Assessment & Plan Note (Addendum)
[name] is a 55 y.o. caucasian male w/ chronic diarrhea, epigastric pain, weight loss & leukocytosis.  Nothing to explain on recent colonoscopy, flex sig, EGD w/ small bowel bx.  Differentials include lymphoma, pancreatic insufficiency, small bowel bacterial overgrowth, lactose intolerance or IBS.  Doubt unresolved infectious process.  c diff pcr, giardia, culture, lactoferrin Align daily

## 2011-03-05 NOTE — Progress Notes (Signed)
Cc to PCP 

## 2011-03-08 ENCOUNTER — Ambulatory Visit (HOSPITAL_COMMUNITY)
Admission: RE | Admit: 2011-03-08 | Discharge: 2011-03-08 | Disposition: A | Discharge status: Home / Self Care | Payer: BC Managed Care – PPO | Source: Ambulatory Visit | Attending: Urgent Care | Admitting: Urgent Care

## 2011-03-08 DIAGNOSIS — K529 Noninfective gastroenteritis and colitis, unspecified: Secondary | ICD-10-CM

## 2011-03-08 DIAGNOSIS — R634 Abnormal weight loss: Secondary | ICD-10-CM

## 2011-03-08 DIAGNOSIS — R935 Abnormal findings on diagnostic imaging of other abdominal regions, including retroperitoneum: Secondary | ICD-10-CM | POA: Insufficient documentation

## 2011-03-08 DIAGNOSIS — R197 Diarrhea, unspecified: Secondary | ICD-10-CM | POA: Insufficient documentation

## 2011-03-08 LAB — GIARDIA/CRYPTOSPORIDIUM (EIA)
Cryptosporidium Screen (EIA): NEGATIVE
Giardia Screen (EIA): NEGATIVE

## 2011-03-08 MED ORDER — IOHEXOL 300 MG/ML  SOLN
100.0000 mL | Freq: Once | INTRAMUSCULAR | Status: AC | PRN
  Administered 2011-03-08: 100 mL via INTRAVENOUS

## 2011-03-08 NOTE — Progress Notes (Signed)
Quick Note:  See lab note today's date Cc: LUKING,SCOTT, MD, MD  ______

## 2011-03-09 ENCOUNTER — Ambulatory Visit (INDEPENDENT_AMBULATORY_CARE_PROVIDER_SITE_OTHER): Payer: BC Managed Care – PPO | Admitting: Pulmonary Disease

## 2011-03-09 ENCOUNTER — Encounter: Payer: Self-pay | Admitting: Pulmonary Disease

## 2011-03-09 DIAGNOSIS — G4733 Obstructive sleep apnea (adult) (pediatric): Secondary | ICD-10-CM

## 2011-03-09 DIAGNOSIS — G2581 Restless legs syndrome: Secondary | ICD-10-CM | POA: Insufficient documentation

## 2011-03-09 MED ORDER — ROPINIROLE HCL 0.5 MG PO TABS: ORAL_TABLET | ORAL | Status: DC

## 2011-03-09 NOTE — Assessment & Plan Note (Signed)
The pt has a history that is suggestive of RLS, and I think we should give him a trial of a dopamine agonist.  He will give me feedback in a few weeks with his response.

## 2011-03-09 NOTE — Progress Notes (Signed)
Results Cc to PCP  

## 2011-03-09 NOTE — Patient Instructions (Signed)
Will try on requip for your leg symptoms, 0.5 mg start with one after dinner each night, then after a week increase to two if still having symptoms Will refer you to a ENT surgeon to give an opinion about surgery for your mild sleep apnea. Please call me in 3 weeks with how things are going on the medication.

## 2011-03-09 NOTE — Progress Notes (Signed)
Results Cc to Dr. Gerda Diss

## 2011-03-09 NOTE — Progress Notes (Signed)
DS informed pt.

## 2011-03-09 NOTE — Progress Notes (Signed)
  Subjective:    Patient ID: CHI GARLOW, male    DOB: 1956/04/23, 55 y.o.   MRN: [id number]  HPI The patient is a 55 year old male who I've been asked to see for ongoing sleepiness issues.  He was diagnosed with very mild obstructive sleep apnea in 2006, with an AHI of only 7 events per hour.  He also had large numbers of periodic leg movements, with 3 per hour resulting in arousals or awakenings.  The patient was started on CPAP for his sleep apnea, but has never done well with the device.  He has tried a full face mask, nasal mask, as well as nasal pillows.  He basically does not wear his CPAP, and has not done so for many years.  The patient states that he awakens at night feeling like he is "strangling".  He also continues to have significant snoring.  He has frequent awakenings during the night, and has not rested in the mornings upon arising.  However, he admits that he does have significant leg discomfort in the evenings and during the night, and that his wife reports that he kicks during the night.  The patient describes his leg discomfort as more pain rather than "creepy crawly".  The patient states that he has severe daytime sleepiness anytime he sits down.  However, he is also on many medications that can result in hypersomnia.  His weight is neutral of the last years.   Review of Systems  Constitutional: Positive for appetite change and unexpected weight change. Negative for fever.  HENT: Positive for trouble swallowing. Negative for ear pain, nosebleeds, congestion, sore throat, rhinorrhea, sneezing, dental problem, postnasal drip and sinus pressure.   Eyes: Negative for redness and itching.  Respiratory: Positive for cough and shortness of breath. Negative for chest tightness and wheezing.   Cardiovascular: Negative for palpitations and leg swelling.  Gastrointestinal: Negative for nausea and vomiting.  Genitourinary: Positive for dysuria.  Musculoskeletal: Positive for myalgias.  Negative for joint swelling.  Skin: Negative for rash.  Neurological: Negative for headaches.  Hematological: Does not bruise/bleed easily.  Psychiatric/Behavioral: Negative for dysphoric mood. The patient is not nervous/anxious.        Objective:   Physical Exam Constitutional:  Well developed, no acute distress  HENT:  Nares nearly obstructed bilat with septal deviation and turbinate issues.  Oropharynx without exudate, palate and uvula are moderately elongated.  Eyes:  Perrla, eomi, no scleral icterus  Neck:  No JVD, no TMG  Cardiovascular:  Normal rate, regular rhythm, no rubs or gallops.  No murmurs        Intact distal pulses  Pulmonary :  Normal breath sounds, no stridor or respiratory distress   No rales, rhonchi, or wheezing  Abdominal:  Soft, nondistended, bowel sounds present.  No tenderness noted.   Musculoskeletal:  No lower extremity edema noted.  Lymph Nodes:  No cervical lymphadenopathy noted  Skin:  No cyanosis noted  Neurologic:  Alert, appropriate, moves all 4 extremities without obvious deficit.         Assessment & Plan:

## 2011-03-09 NOTE — Assessment & Plan Note (Signed)
The patient has a history of very mild sleep apnea, however I am not sure if this has anything to do with his daytime sleepiness.  He is on large numbers of sedating medications, and by history he may also have the restless leg syndrome.  He clearly is not going to tolerate CPAP, and I have discussed with him the other treatment options for his mild sleep apnea.  These include upper airway surgery, and also dental appliance.  I do not think he will tolerate an appliance given his nasal obstruction, but would consider the surgical option.  I will refer him to otolaryngology in the [place] area for evaluation.  Again, it is unclear how much this is contributing to his overall sleepiness.

## 2011-03-09 NOTE — Progress Notes (Addendum)
Quick Note:  Let pt know all stools normal Needs OV w/ RMR ONLY next available to discuss chronic abd pain, nausea & wt loss ______

## 2011-03-10 ENCOUNTER — Encounter (HOSPITAL_COMMUNITY): Payer: BC Managed Care – PPO | Attending: Oncology

## 2011-03-10 ENCOUNTER — Encounter: Payer: Self-pay | Admitting: Internal Medicine

## 2011-03-10 DIAGNOSIS — J4489 Other specified chronic obstructive pulmonary disease: Secondary | ICD-10-CM | POA: Insufficient documentation

## 2011-03-10 DIAGNOSIS — R634 Abnormal weight loss: Secondary | ICD-10-CM | POA: Insufficient documentation

## 2011-03-10 DIAGNOSIS — J449 Chronic obstructive pulmonary disease, unspecified: Secondary | ICD-10-CM | POA: Insufficient documentation

## 2011-03-10 DIAGNOSIS — R630 Anorexia: Secondary | ICD-10-CM | POA: Insufficient documentation

## 2011-03-10 DIAGNOSIS — F17200 Nicotine dependence, unspecified, uncomplicated: Secondary | ICD-10-CM | POA: Insufficient documentation

## 2011-03-10 DIAGNOSIS — D72829 Elevated white blood cell count, unspecified: Secondary | ICD-10-CM | POA: Insufficient documentation

## 2011-03-10 DIAGNOSIS — R61 Generalized hyperhidrosis: Secondary | ICD-10-CM | POA: Insufficient documentation

## 2011-03-10 LAB — CBC
HCT: 48 % (ref 39.0–52.0)
Hemoglobin: 17.1 g/dL — ABNORMAL HIGH (ref 13.0–17.0)
MCH: 32.3 pg (ref 26.0–34.0)
MCHC: 35.6 g/dL (ref 30.0–36.0)
MCV: 90.7 fL (ref 78.0–100.0)
RDW: 13.1 % (ref 11.5–15.5)

## 2011-03-10 NOTE — Progress Notes (Signed)
Labs drawn today for cbc 

## 2011-03-10 NOTE — Progress Notes (Signed)
Pt is aware of OV for 12/21 at 0900 with RMR (that is his next available) and appt card was mailed.

## 2011-03-12 ENCOUNTER — Encounter: Payer: Self-pay | Admitting: Pulmonary Disease

## 2011-03-12 ENCOUNTER — Encounter (HOSPITAL_BASED_OUTPATIENT_CLINIC_OR_DEPARTMENT_OTHER): Payer: BC Managed Care – PPO | Admitting: Oncology

## 2011-03-12 DIAGNOSIS — R61 Generalized hyperhidrosis: Secondary | ICD-10-CM

## 2011-03-12 DIAGNOSIS — J4489 Other specified chronic obstructive pulmonary disease: Secondary | ICD-10-CM

## 2011-03-12 DIAGNOSIS — J984 Other disorders of lung: Secondary | ICD-10-CM

## 2011-03-12 DIAGNOSIS — J449 Chronic obstructive pulmonary disease, unspecified: Secondary | ICD-10-CM

## 2011-03-12 DIAGNOSIS — R630 Anorexia: Secondary | ICD-10-CM

## 2011-03-12 DIAGNOSIS — F17200 Nicotine dependence, unspecified, uncomplicated: Secondary | ICD-10-CM

## 2011-03-12 DIAGNOSIS — D72829 Elevated white blood cell count, unspecified: Secondary | ICD-10-CM

## 2011-03-12 DIAGNOSIS — D751 Secondary polycythemia: Secondary | ICD-10-CM

## 2011-03-12 DIAGNOSIS — R634 Abnormal weight loss: Secondary | ICD-10-CM

## 2011-03-12 LAB — COMPREHENSIVE METABOLIC PANEL
ALT: 26 U/L (ref 0–53)
Alkaline Phosphatase: 91 U/L (ref 39–117)
BUN: 10 mg/dL (ref 6–23)
CO2: 26 mEq/L (ref 19–32)
GFR calc Af Amer: >60 mL/min (ref >60)
GFR calc non Af Amer: >60 mL/min (ref >60)
Glucose, Bld: 69 mg/dL — ABNORMAL LOW (ref 70–99)
Potassium: 3.7 mEq/L (ref 3.5–5.1)
Sodium: 137 mEq/L (ref 135–145)
Total Bilirubin: 0.3 mg/dL (ref 0.3–1.2)
Total Protein: 7 g/dL (ref 6.0–8.3)

## 2011-03-12 LAB — CBC
HCT: 47 % (ref 39.0–52.0)
Hemoglobin: 17.1 g/dL — ABNORMAL HIGH (ref 13.0–17.0)
MCHC: 36.1 g/dL — ABNORMAL HIGH (ref 30.0–36.0)
MCV: 90.2 fL (ref 78.0–100.0)
RDW: 12.9 % (ref 11.5–15.5)
WBC: 13.6 10*3/uL — ABNORMAL HIGH (ref 4.0–10.5)

## 2011-03-12 LAB — DIFFERENTIAL
Basophils Absolute: 0 10*3/uL (ref 0.0–0.1)
Eosinophils Relative: 1 % (ref 0–5)
Lymphocytes Relative: 16 % (ref 12–46)
Monocytes Absolute: 0.5 10*3/uL (ref 0.1–1.0)
Monocytes Relative: 4 % (ref 3–12)

## 2011-03-12 NOTE — Progress Notes (Signed)
[name] Punt, MD, MD [address] [place] [address]  1. TOBACCO ABUSE  CT Chest Wo Contrast  2. COPD  CT Chest Wo Contrast  3. Weight loss, abnormal  CT Chest Wo Contrast  4. Leukocytosis  CBC, Differential, Basic metabolic panel  5. Night sweats  CBC, Differential, Comprehensive metabolic panel, Lactate dehydrogenase, Sedimentation rate  6. Decreased appetite  CBC, Differential, Comprehensive metabolic panel, Lactate dehydrogenase, Sedimentation rate  7. 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 male returns for  regular  visit for followup of reactive leukocytosis.  I personally reviewed and went over laboratory results with the patient.  This revealed a minimally elevated Hgb at 17.1.  This is likely secondary to his lung disease, but we will continue to follow.   The patient in undergoing an extensive GI work-up for diarrhea.  I personally reviewed and went over radiographic studies with the patient which did not reveal any abdominal or pelvic abnormalities.  The patient reports that he remains short of breath all day.  As a result, he is unable to perform activities at home.  He is not working and trying to obtain disability benefits.  He presently has a Clinical research associate working on this.  He went to court about disability and lost, but his attorney is appealing that ruling.    The patient admits to continued diarrhea which is being followed by GI.  The patient reports a poor appetite.  He explains that he has one meal daily which he forces to consume.  He continues to smoke 1/2-1 pack per day.  He has a 30 pack year smoking history.  He reports that he has an occasional EtOH beverage.  He reports an average of 3 beers per month.  Interestingly, the patient admits to drenching night sweats that cause him to change his night clothes and sometimes his sheets and pillowcase.  He denies any weight loss however.  He denies any new lumps or  bumps on his body.  He tells me that he is seeing an ENT physician in [place] in the near future for his nasal breathing problems.  Past Medical History  Diagnosis Date  . ASCVD (arteriosclerotic cardiovascular disease)     inferior MI with circumflex PTCA in 1993;cath in 2000-50% OM 1&2,30% LAD ;2008-25% LAD, 80% nondominant right ,60% small OM 2,EF of 55% with severe basilar inferior hypokinesis; stress nuclear study in 04/2008 moderate inferior scar with peri-infarction ischemic  . Syncope   . Hyperlipidemia   . Hypertension   . Tobacco abuse     50 pack years continuing at one halp pack daily  . COPD (chronic obstructive pulmonary disease)     mild ;excercise induced hypoxemia by cp stress test ;asthma ,bronchitis,  . Restless leg syndrome   . Anxiety and depression   . GERD (gastroesophageal reflux disease)   . Inflammatory polyps of colon with rectal bleeding   . Carcinoma in situ of colon 2004    rectal polyp  . H. pylori infection 2004    treated  . Sleep apnea     does not use CPAP  . Gastritis 12/30/10    EGD Dr Jena Gauss  . Colitis, ischemic 2011  . Tubular adenoma of colon 06/2009    Colonosocpy Dr Jena Gauss  . Leukocytosis     Dr Shon Baton    has Hyperlipidemia; ANXIETY; TOBACCO ABUSE; DEPRESSION; OBSTRUCTIVE SLEEP APNEA; HYPERTENSION; ATHEROSCLEROTIC CARDIOVASCULAR DISEASE; COPD; Chronic diarrhea; Epigastric  pain; Weight loss, abnormal; Anorexia; Esophageal dysphagia; Leukocytosis; and RLS (restless legs syndrome) on his problem list.      has no known allergies.  [name] does not currently have medications on file.  Past Surgical History  Procedure Date  . Colonoscopy w/ polypectomy 2004    rectal polyp with carcinoma in situ removed via colonoscopy  . Hand surgery     surgical intervention for injury of the fingers of the left hand many years ago  . Cholecystectomy 2004  . Colonoscopy 06/2009    normal terminal ileum, segmental mild inflammation of  sigmoid colon (bx unremarkable), polyp, tubular adenoma  . Esophagogastroduodenoscopy 02/2009    query Barrett's but bx negative  . Esophagogastroduodenoscopy 12/30/2010    [name],gastritis, dilated 60F, sm HH, 1 small ulcer, Duodenal erosions, benign bx  . Flexible sigmoidoscopy 12/30/2010     [name],; internal hemorrhoids, anal papilla  . Heart stent     Denies any headaches, dizziness, double vision, fevers, chills, nausea, vomiting, constipation, chest pain, heart palpitations, blood in stool, black tarry stool, urinary pain, urinary burning, urinary frequency, hematuria.   PHYSICAL EXAMINATION  ECOG PERFORMANCE STATUS: 2 - Symptomatic, <50% confined to bed  Filed Vitals:   03/12/11 0914  BP: 143/87  Pulse: 71  Temp: 97 F (36.1 C)    GENERAL:alert, no distress, well nourished, well developed and cooperative SKIN: skin color, texture, turgor are normal HEAD: Normocephalic EYES: normal EARS: External ears normal OROPHARYNX:mucous membranes are moist  NECK: supple, no adenopathy, no bruits, thyroid normal size, non-tender, without nodularity, no stridor, non-tender, trachea midline LYMPH:  no palpable lymphadenopathy, no hepatosplenomegaly BREAST:not examined LUNGS: clear to auscultation and percussion, with decreased breath sounds throughout and hyper-resonance to percussion. HEART: regular rate & rhythm, no murmurs, no gallops, S1 normal and S2 normal ABDOMEN:abdomen soft, non-tender, normal bowel sounds and no hepatosplenomegaly BACK: Back symmetric, no curvature. EXTREMITIES:less then 2 second capillary refill, no joint deformities, effusion, or inflammation, no edema, no skin discoloration, no clubbing, no cyanosis, flattening of the fingernails  NEURO: alert & oriented x 3 with fluent speech, no focal motor/sensory deficits, gait normal   LABORATORY DATA: CBC    Component Value Date/Time   WBC 10.3 03/10/2011 0906   RBC 5.29 03/10/2011 0906   HGB  17.1* 03/10/2011 0906   HCT 48.0 03/10/2011 0906   PLT 198 03/10/2011 0906   MCV 90.7 03/10/2011 0906   MCH 32.3 03/10/2011 0906   MCHC 35.6 03/10/2011 0906   RDW 13.1 03/10/2011 0906   LYMPHSABS 2.0 12/21/2010 0934   MONOABS 0.4 12/21/2010 0934   EOSABS 0.1 12/21/2010 0934   BASOSABS 0.0 12/21/2010 0934    PENDING LABS: CBC diff, CMET, LDH, ESR   RADIOGRAPHIC STUDIES:  03/08/11  *RADIOLOGY REPORT*  Clinical Data: Diarrhea and weight loss  CT ABDOMEN AND PELVIS WITH CONTRAST  Technique: Multidetector CT imaging of the abdomen and pelvis was  performed following the standard protocol during bolus  administration of intravenous contrast.  Contrast: OMNIPAQUE IOHEXOL 300 MG/ML IV SOLN  Comparison: None.  Findings: There are two tiny hypodensities in the left lobe of the  liver on image 7 of unknown significance. Post cholecystectomy.  The hypodensities are too small to characterize. Spleen, pancreas,  adrenal glands, kidneys are within normal limits. Bowel is  decompressed. Normal appendix. Bladder and prostate are within  normal limits. No abnormal adenopathy. No free fluid. Lumbar  degenerative disc disease at L4-5 and L5-S1 is  noted. Mild  centrilobular emphysema at the lung bases.  IMPRESSION:  No acute intra-abdominal pathology. Tiny hepatic hypodensities are  too small to characterize and are of unknown significance.  Original Report Authenticated By: Donavan Burnet, M.D.       ASSESSMENT:  1. Erythrocytosis, secondary to lung disease 2. Leukocytosis 3. COPD 4. Tobacco abuse 30+ pack years, currently smoking up to 1 ppd.  Began smoking at the age of 63 5. Shortness of breath 6. Fatigue 7. Night sweats 8. Decreased appetite   PLAN:  1. I personally reviewed and went over laboratory results with the patient. 2. I personally reviewed and went over radiographic studies with the patient. 3. Smoking cessation education 4. Patient education regarding erythrocytosis and  its association with smoking tobacco 5. CT of chest without contrast to evaluate for malignancy, high risk for lung cancer due to 30+ pack years of tobacco.  Patient educated about role of low-dose spiral CT of chest for evaluation of lung malignancy.  He knows that 96% of nodules are benign.  If there is a lung nodule noted, we will follow-up on that per recommendations of radiologist. 6. Lab work today: CBC diff, CMET, LDH, ESR 7. Lab work in 4 months: CBC diff, BMET 8. Return in 4 months following lab work for follow-up. 9. More than 50% of the time spent with the patient was utilized for counseling.   All questions were answered. The patient knows to call the clinic with any problems, questions or concerns. We can certainly see the patient much sooner if necessary.  The patient and plan discussed with Glenford Peers, MD and he is in agreement with the aforementioned.   Vander Kueker

## 2011-03-12 NOTE — Patient Instructions (Signed)
[facility]  Discharge Instructions  RECOMMENDATIONS MADE BY THE CONSULTANT AND ANY TEST RESULTS WILL BE SENT TO YOUR REFERRING DOCTOR.   EXAM FINDINGS BY MD TODAY AND SIGNS AND SYMPTOMS TO REPORT TO CLINIC OR PRIMARY MD: doing ok.  Will do some labs today and get a CT of your chest without contrast sometime next week.  MEDICATIONS PRESCRIBED: none    SPECIAL INSTRUCTIONS/FOLLOW-UP: Lab work Needed today and in 4 months, Xray Studies Needed CT of chest and Return to Clinic in 4 months after labs.   I acknowledge that I have been informed and understand all the instructions given to me and received a copy. I do not have any more questions at this time, but understand that I may call the Specialty Clinic at [facility] at [phone] during business hours should I have any further questions or need assistance in obtaining follow-up care.    __________________________________________  _____________  __________ Signature of Patient or Authorized Representative            Date                   Time    __________________________________________ Nurse's Signature

## 2011-03-16 ENCOUNTER — Telehealth: Payer: Self-pay | Admitting: Pulmonary Disease

## 2011-03-16 NOTE — Telephone Encounter (Signed)
Called, spoke with pt's wife, Amil Amen.  States pt was seen by Dr. Pollyann Kennedy today.  She is calling as a FYI to inform [facility] that Dr. Pollyann Kennedy will not do the surgery that [facility] recommended because he does not think pt needs to be put under general anesthesia.  Advised will forward message to Dr. Shelle Iron as Lorain Childes.

## 2011-03-17 ENCOUNTER — Ambulatory Visit (HOSPITAL_COMMUNITY)
Admission: RE | Admit: 2011-03-17 | Discharge: 2011-03-17 | Disposition: A | Discharge status: Home / Self Care | Payer: BC Managed Care – PPO | Source: Ambulatory Visit | Attending: Oncology | Admitting: Oncology

## 2011-03-17 DIAGNOSIS — R5381 Other malaise: Secondary | ICD-10-CM | POA: Insufficient documentation

## 2011-03-17 DIAGNOSIS — R0602 Shortness of breath: Secondary | ICD-10-CM | POA: Insufficient documentation

## 2011-03-17 DIAGNOSIS — J449 Chronic obstructive pulmonary disease, unspecified: Secondary | ICD-10-CM

## 2011-03-17 DIAGNOSIS — F17200 Nicotine dependence, unspecified, uncomplicated: Secondary | ICD-10-CM

## 2011-03-17 DIAGNOSIS — J984 Other disorders of lung: Secondary | ICD-10-CM | POA: Insufficient documentation

## 2011-03-17 DIAGNOSIS — R634 Abnormal weight loss: Secondary | ICD-10-CM

## 2011-03-17 DIAGNOSIS — J439 Emphysema, unspecified: Secondary | ICD-10-CM | POA: Insufficient documentation

## 2011-03-17 NOTE — Telephone Encounter (Signed)
Let them know that is Dr. Lucky Rathke decision since he is the one doing the surgery.  Do not have any other options for treatment of his sleep apnea.  Hopefully, the medication for RLS may help his sleep.

## 2011-03-17 NOTE — Telephone Encounter (Signed)
Pt wife advised. Mirelle Biskup, CMA  

## 2011-03-24 ENCOUNTER — Other Ambulatory Visit (HOSPITAL_COMMUNITY): Payer: Self-pay | Admitting: Oncology

## 2011-03-24 ENCOUNTER — Telehealth (HOSPITAL_COMMUNITY): Payer: Self-pay | Admitting: *Deleted

## 2011-03-24 DIAGNOSIS — R911 Solitary pulmonary nodule: Secondary | ICD-10-CM

## 2011-03-24 NOTE — Telephone Encounter (Signed)
Spoke with pt's wife Amil Amen. She will relay information to pt. She said for [place] to go ahead and schedule CT appointment and call him with the date/time.

## 2011-03-24 NOTE — Telephone Encounter (Signed)
Message copied by Dennie Maizes on Wed Mar 24, 2011 12:11 PM ------      Message from: Mariel Sleet, ERIC S      Created: Mon Mar 22, 2011  4:22 PM       Call Sarthak-he needs a f/u CT chest in 3-4 mo because of a small(7.36mm) nodule that has grown a little.      Please schedule if ok with him

## 2011-03-25 LAB — POCT I-STAT 3, VENOUS BLOOD GAS (G3P V)
Acid-base deficit: 3 — ABNORMAL HIGH
O2 Saturation: 73
TCO2: 24
pCO2, Ven: 39.7 — ABNORMAL LOW

## 2011-03-25 LAB — POCT I-STAT 3, ART BLOOD GAS (G3+)
Acid-base deficit: 2
O2 Saturation: 95
Operator id: 194801

## 2011-03-31 ENCOUNTER — Telehealth (HOSPITAL_COMMUNITY): Payer: Self-pay | Admitting: *Deleted

## 2011-03-31 NOTE — Telephone Encounter (Signed)
He has a lab appointment on 1/17

## 2011-04-26 ENCOUNTER — Encounter: Payer: Self-pay | Admitting: Cardiology

## 2011-04-27 ENCOUNTER — Encounter (HOSPITAL_COMMUNITY): Payer: Self-pay | Admitting: Psychiatry

## 2011-04-28 ENCOUNTER — Encounter: Payer: Self-pay | Admitting: Cardiology

## 2011-04-28 ENCOUNTER — Ambulatory Visit (INDEPENDENT_AMBULATORY_CARE_PROVIDER_SITE_OTHER): Payer: BC Managed Care – PPO | Admitting: Cardiology

## 2011-04-28 VITALS — BP 127/88 | HR 76 | Ht 70.0 in | Wt 165.0 lb

## 2011-04-28 DIAGNOSIS — I10 Essential (primary) hypertension: Secondary | ICD-10-CM

## 2011-04-28 DIAGNOSIS — I2510 Atherosclerotic heart disease of native coronary artery without angina pectoris: Secondary | ICD-10-CM

## 2011-04-28 DIAGNOSIS — F17200 Nicotine dependence, unspecified, uncomplicated: Secondary | ICD-10-CM

## 2011-04-28 DIAGNOSIS — E785 Hyperlipidemia, unspecified: Secondary | ICD-10-CM

## 2011-04-28 NOTE — Patient Instructions (Signed)
Your physician recommends that you schedule a follow-up appointment in: 9 months (you will receive a letter in the mail)  Your physician recommends that you return for lab work in: Lipids this week

## 2011-04-28 NOTE — Assessment & Plan Note (Signed)
Most recent lipid profile was somewhat suboptimal.  A repeat study will be obtained and therapy adjusted accordingly.  An increase in his dose of pravastatin to 80 mg per day most likely will be appropriate.

## 2011-04-28 NOTE — Progress Notes (Signed)
HPI: [name] returns to the office as scheduled for continued assessment and treatment of moderate coronary artery disease, hypertension and hyperlipidemia.  Since his last visit, he has received attention from Dr. Shelle Iron for mild sleep apnea and possible restless leg syndrome and from Dr. Pollyann Kennedy for possible surgical approach to sleep apnea.  The patient was told that surgery is not feasible.  He discontinued cigarette smoking a few months ago without pharmacologic assistance.  He notes a modest improvement in respiratory symptoms, but he recently contracted a upper respiratory infection resulting in recurrent cough and sputum production.  Evaluation is in progress for a lung nodule.  He reportedly has weight loss, but has actually gained 8 pounds since last time I saw him in April.  Prior to Admission medications   Medication Sig Start Date End Date Taking? Authorizing Provider  albuterol (PROVENTIL) (2.5 MG/3ML) 0.083% nebulizer solution Take 2.5 mg by nebulization every 6 (six) hours as needed.     Yes Historical Provider, MD  albuterol-ipratropium (COMBIVENT) 18-103 MCG/ACT inhaler Inhale 2 puffs into the lungs every 6 (six) hours as needed.     Yes Historical Provider, MD  amLODipine (NORVASC) 5 MG tablet Take 5 mg by mouth daily.     Yes Historical Provider, MD  aspirin 81 MG tablet Take 81 mg by mouth daily.     Yes Historical Provider, MD  budesonide-formoterol (SYMBICORT) 80-4.5 MCG/ACT inhaler Inhale 2 puffs into the lungs 2 (two) times daily.     Yes Historical Provider, MD  clonazePAM (KLONOPIN) 0.5 MG tablet Take 0.5 mg by mouth 3 (three) times daily as needed.  08/24/10  Yes Historical Provider, MD  Cyanocobalamin (VITAMIN B-12) 2000 MCG TBCR Take 1 tablet by mouth 1 dose over 46 hours.     Yes Historical Provider, MD  FLUoxetine (PROZAC) 20 MG capsule Take 20 mg by mouth 2 (two) times daily. Take 2 caps at one time 08/24/10  Yes Historical Provider, MD  gabapentin (NEURONTIN) 300 MG  capsule Take 300 mg by mouth 2 (two) times daily.    Yes Historical Provider, MD  HYDROcodone-acetaminophen (LORTAB) 7.5-500 MG per tablet Take 1 tablet by mouth every 6 (six) hours as needed.     Yes Historical Provider, MD  lisinopril (PRINIVIL,ZESTRIL) 20 MG tablet Take 20 mg by mouth daily.     Yes Historical Provider, MD  metoprolol succinate (TOPROL-XL) 25 MG 24 hr tablet Take 1 tablet (25 mg total) by mouth daily. 01/06/11  Yes Gerrit Friends. Glenn Christo, MD  OLANZapine (ZYPREXA) 5 MG tablet Take 5 mg by mouth at bedtime.     Yes Historical Provider, MD  pantoprazole (PROTONIX) 40 MG tablet Take 40 mg by mouth daily.  01/31/11  Yes Historical Provider, MD  pravastatin (PRAVACHOL) 40 MG tablet Take 1 tablet (40 mg total) by mouth daily. 09/28/10  Yes Gerrit Friends. Ellis Koffler, MD  rOPINIRole (REQUIP) 0.5 MG tablet Take as directed 03/09/11  Yes Barbaraann Share, MD  Testosterone (ANDROGEL PUMP) 1.25 GM/ACT (1%) GEL Place onto the skin 4 (four) times daily.     Yes Historical Provider, MD    No Known Allergies    Past medical history, social history, and family history reviewed and updated.  ROS: Denies orthopnea, PND, chest pain or syncope.  PHYSICAL EXAM: BP 127/88  Pulse 76  Ht 5\' 10"  (1.778 m)  Wt 74.844 kg (165 lb)  BMI 23.68 kg/m2  SpO2 96%   General-Well developed; no acute distress; depressed affect Body habitus-proportionate  weight and height Neck-No JVD; no carotid bruits Lungs-clear lung fields; resonant to percussion Cardiovascular-normal PMI; split S1 and normal S2 Abdomen-normal bowel sounds; soft and non-tender without masses or organomegaly Musculoskeletal-No deformities, no cyanosis or clubbing Neurologic-Normal cranial nerves; symmetric strength and tone Skin-Warm, no significant lesions Extremities-distal pulses intact; no edema   ASSESSMENT AND PLAN:   [place] Bing, MD 04/28/2011 12:22 PM

## 2011-04-28 NOTE — Assessment & Plan Note (Signed)
Blood pressure control is excellent with current therapy, which will be continued.  There is no orthostatic change in blood pressure.

## 2011-04-28 NOTE — Assessment & Plan Note (Signed)
[name] has no symptoms at present to suggest the presence of myocardial ischemia.  We will continue to focus on optimal control of cardiovascular risk factors.

## 2011-04-28 NOTE — Assessment & Plan Note (Signed)
Patient was congratulated on discontinuation of cigarette smoking and strongly encouraged not to resume, as he did in 2008. It's unfortunate that marked improvement in his respiratory symptoms has not resulted, but he seems to appreciate the importance of refraining from tobacco use.

## 2011-04-30 ENCOUNTER — Encounter (HOSPITAL_COMMUNITY): Payer: Self-pay | Admitting: Psychiatry

## 2011-04-30 ENCOUNTER — Encounter (HOSPITAL_COMMUNITY): Payer: BC Managed Care – PPO | Admitting: Psychiatry

## 2011-04-30 ENCOUNTER — Ambulatory Visit (INDEPENDENT_AMBULATORY_CARE_PROVIDER_SITE_OTHER): Payer: BC Managed Care – PPO | Admitting: Psychiatry

## 2011-04-30 DIAGNOSIS — F419 Anxiety disorder, unspecified: Secondary | ICD-10-CM

## 2011-04-30 DIAGNOSIS — F411 Generalized anxiety disorder: Secondary | ICD-10-CM

## 2011-04-30 MED ORDER — ALPRAZOLAM 1 MG PO TABS: 1.0000 mg | ORAL_TABLET | Freq: Four times a day (QID) | ORAL | Status: DC | PRN

## 2011-04-30 NOTE — Progress Notes (Signed)
   [facility] Follow-up Outpatient Visit  [name] Aug 04, 1955  Date:    Subjective: Finds clonazepam not helpful, but wants return to xanax.  Anxious about spots on lung and liver, quit smoking, has a visit to cancer center.  Mood unchanged.  There were no vitals filed for this visit.  Mental Status Examination  Appearance: gaunt and fatiqued, appears ill Alert: Yes Attention: good  Cooperative: Yes Eye Contact: Good Speech: slow and restricted Psychomotor Activity: Decreased Memory/Concentration: normal Oriented: person, place, time/date and situation Mood: Anxious and Dysphoric Affect: Restricted Thought Processes and Associations: Linear Fund of Knowledge: Good Thought Content: normal Insight: Good Judgement: Good  Diagnosis: Anxiety and depression  Treatment Plan: Continue meds as prescribed  Rossie Muskrat, MD

## 2011-05-01 LAB — LIPID PANEL
Cholesterol: 180 mg/dL (ref 0–200)
HDL: 26 mg/dL — ABNORMAL LOW (ref >39)
Triglycerides: 199 mg/dL — ABNORMAL HIGH (ref <150)

## 2011-05-03 ENCOUNTER — Other Ambulatory Visit: Payer: Self-pay | Admitting: *Deleted

## 2011-05-03 ENCOUNTER — Encounter: Payer: Self-pay | Admitting: *Deleted

## 2011-05-03 DIAGNOSIS — E7800 Pure hypercholesterolemia, unspecified: Secondary | ICD-10-CM

## 2011-05-03 MED ORDER — ATORVASTATIN CALCIUM 80 MG PO TABS: 80.0000 mg | ORAL_TABLET | Freq: Every day | ORAL | Status: DC

## 2011-05-08 ENCOUNTER — Other Ambulatory Visit (HOSPITAL_COMMUNITY): Payer: Self-pay | Admitting: Psychiatry

## 2011-05-09 ENCOUNTER — Other Ambulatory Visit: Payer: Self-pay | Admitting: Cardiology

## 2011-05-09 ENCOUNTER — Other Ambulatory Visit: Payer: Self-pay | Admitting: Oncology

## 2011-05-21 ENCOUNTER — Other Ambulatory Visit (HOSPITAL_COMMUNITY): Payer: Self-pay | Admitting: Psychiatry

## 2011-05-21 NOTE — Progress Notes (Signed)
Called pharmacy for prozac 

## 2011-06-04 ENCOUNTER — Encounter: Payer: Self-pay | Admitting: Internal Medicine

## 2011-06-04 ENCOUNTER — Ambulatory Visit (INDEPENDENT_AMBULATORY_CARE_PROVIDER_SITE_OTHER): Payer: BC Managed Care – PPO | Admitting: Internal Medicine

## 2011-06-04 ENCOUNTER — Other Ambulatory Visit: Payer: Self-pay | Admitting: Gastroenterology

## 2011-06-04 VITALS — BP 115/80 | HR 77 | Temp 97.4°F | Ht 70.0 in | Wt 171.8 lb

## 2011-06-04 DIAGNOSIS — R1310 Dysphagia, unspecified: Secondary | ICD-10-CM

## 2011-06-04 DIAGNOSIS — R634 Abnormal weight loss: Secondary | ICD-10-CM

## 2011-06-04 NOTE — Progress Notes (Signed)
Primary Care Physician:  [name], MD, MD Primary Gastroenterologist:  Dr.   Pre-Procedure History & Physical: HPI:  [name] is a 55 y.o. male here for followup. Diarrhea and weight loss. Start on omeprazole 20 mg daily. Apparently surgery for the nasal septum is she uses planned. Has 2-3 bowel movements daily. States that he goes right through him. However, he has gained 4 pounds since his last office visit with [place]. Chronic leukocytosis/erythrocytosis followed by Dr. Mariel Sleet. Currently has a enlarging lung nodule which is being watched closely by Dr. not compared long, long smoking history. He continues to smoke but has cut down recently. He also complains of intermittent esophageal dysphagia to solid food  Past Medical History  Diagnosis Date  . ASCVD (arteriosclerotic cardiovascular disease)     inferior MI with circumflex PTCA in 1993;cath in 2000-50% OM 1&2,30% LAD ;2008-25% LAD, 80% nondominant right ,60% small OM 2,EF of 55% with severe basilar inferior hypokinesis; stress nuclear study in 04/2008 moderate inferior scar with peri-infarction ischemic  . Syncope   . Hyperlipidemia   . Hypertension   . Tobacco abuse     50 pack years continuing at one halp pack daily  . COPD (chronic obstructive pulmonary disease)     mild ;excercise induced hypoxemia by cp stress test ;asthma ,bronchitis,  . Restless leg syndrome   . Anxiety and depression   . GERD (gastroesophageal reflux disease)   . Inflammatory polyps of colon with rectal bleeding   . Carcinoma in situ of colon 2004    rectal polyp  . H. pylori infection 2004    treated  . Sleep apnea     does not use CPAP  . Gastritis 12/30/10    EGD Dr Jena Gauss  . Colitis, ischemic 2011  . Tubular adenoma of colon 06/2009    Colonosocpy Dr Jena Gauss  . Leukocytosis     Dr Shon Baton  . Depression   . Anxiety     Past Surgical History  Procedure Date  . Colonoscopy w/ polypectomy 2004    rectal polyp with carcinoma in situ  removed via colonoscopy  . Hand surgery     surgical intervention for injury of the fingers of the left hand many years ago  . Cholecystectomy 2004  . Colonoscopy 06/2009    normal terminal ileum, segmental mild inflammation of sigmoid colon (bx unremarkable), polyp, tubular adenoma  . Esophagogastroduodenoscopy 02/2009    query Barrett's but bx negative  . Esophagogastroduodenoscopy 12/30/2010    Gerrit Friends Mossie Gilder,gastritis, dilated 26F, sm HH, 1 small ulcer, Duodenal erosions, benign bx  . Flexible sigmoidoscopy 12/30/2010     Gerrit Friends Kassandra Meriweather,; internal hemorrhoids, anal papilla  . Heart stent     Prior to Admission medications   Medication Sig Start Date End Date Taking? Authorizing Provider  albuterol (PROVENTIL) (2.5 MG/3ML) 0.083% nebulizer solution Take 2.5 mg by nebulization every 6 (six) hours as needed.     Yes Historical Provider, MD  albuterol-ipratropium (COMBIVENT) 18-103 MCG/ACT inhaler Inhale 2 puffs into the lungs every 6 (six) hours as needed.     Yes Historical Provider, MD  ALPRAZolam Prudy Feeler) 1 MG tablet Take 1 tablet (1 mg total) by mouth 4 (four) times daily as needed. 04/30/11  Yes Rossie Muskrat, MD  amLODipine (NORVASC) 5 MG tablet Take 5 mg by mouth daily.    Yes Historical Provider, MD  aspirin 81 MG tablet Take 81 mg by mouth daily.     Yes Historical Provider, MD  atorvastatin (LIPITOR) 80 MG tablet Take 1 tablet (80 mg total) by mouth daily. 05/03/11 05/02/12 Yes Gerrit Friends. Rothbart, MD  budesonide-formoterol (SYMBICORT) 80-4.5 MCG/ACT inhaler Inhale 2 puffs into the lungs 2 (two) times daily.     Yes Historical Provider, MD  clonazePAM (KLONOPIN) 0.5 MG tablet Take 0.5 mg by mouth 3 (three) times daily as needed. PRN FOR ANXIETY    Yes Historical Provider, MD  Cyanocobalamin (VITAMIN B-12) 2000 MCG TBCR Take 1 tablet by mouth 1 dose over 46 hours.     Yes Historical Provider, MD  FLUoxetine (PROZAC) 20 MG capsule Take 20 mg by mouth 2 (two) times daily. Take 2 caps  at one time 08/24/10  Yes Historical Provider, MD  gabapentin (NEURONTIN) 300 MG capsule Take 300 mg by mouth 2 (two) times daily.    Yes Historical Provider, MD  HYDROcodone-acetaminophen (LORTAB) 7.5-500 MG per tablet Take 1 tablet by mouth every 6 (six) hours as needed.     Yes Historical Provider, MD  lisinopril (PRINIVIL,ZESTRIL) 20 MG tablet Take 20 mg by mouth daily.     Yes Historical Provider, MD  OLANZapine (ZYPREXA) 5 MG tablet Take 5 mg by mouth at bedtime.     Yes Historical Provider, MD  omeprazole (PRILOSEC) 20 MG capsule Take 20 mg by mouth daily.     Yes Historical Provider, MD  pravastatin (PRAVACHOL) 40 MG tablet TAKE 1 TABLET EVERY DAY 05/09/11  Yes Gerrit Friends. Rothbart, MD  rOPINIRole (REQUIP) 0.5 MG tablet Take as directed 03/09/11  Yes Barbaraann Share, MD  Testosterone (ANDROGEL PUMP) 1.25 GM/ACT (1%) GEL Place onto the skin 4 (four) times daily.     Yes Historical Provider, MD  metoprolol succinate (TOPROL-XL) 25 MG 24 hr tablet Take 1 tablet (25 mg total) by mouth daily. 01/06/11   Gerrit Friends. Rothbart, MD  pantoprazole (PROTONIX) 40 MG tablet Take 40 mg by mouth daily.  01/31/11   Historical Provider, MD    Allergies as of 06/04/2011  . (No Known Allergies)    Family History  Problem Relation Age of Onset  . Lung disease Father     deceased, black lung  . Heart disease Mother     blood clots  . Cancer Paternal Uncle     unknown type  . Colon cancer Neg Hx   . Cancer Maternal Aunt     unknown type  . Kidney failure Maternal Uncle     History   Social History  . Marital Status: Married    Spouse Name: N/A    Number of Children: 3  . Years of Education: N/A   Occupational History  . disable     [employer]   Social History Main Topics  . Smoking status: Former Smoker -- 0.5 packs/day for 30 years    Types: Cigarettes    Quit date: 01/13/2011  . Smokeless tobacco: Never Used  . Alcohol Use: 1.2 oz/week    2 Cans of beer per week     Drinks a beer occasionally    . Drug Use: No  . Sexually Active: Not on file   Other Topics Concern  . Not on file   Social History Narrative   3 stepchildren    Review of Systems: See HPI, otherwise negative ROS  Physical Exam: BP 115/80  Pulse 77  Temp(Src) 97.4 F (36.3 C) (Temporal)  Ht 5\' 10"  (1.778 m)  Wt 171 lb 12.8 oz (77.928 kg)  BMI 24.65 kg/m2 General:  Alert,  Well-developed, well-nourished, pleasant and cooperative in NAD Skin:  Intact without significant lesions or rashes. Eyes:  Sclera clear, no icterus.   Conjunctiva pink. Ears:  Normal auditory acuity. Nose:  No deformity, discharge,  or lesions. Mouth:  No deformity or lesions. Neck:  Supple; no masses or thyromegaly. No significant cervical adenopathy. Lungs:  Clear throughout to auscultation.   No wheezes, crackles, or rhonchi. No acute distress. Heart:  Regular rate and rhythm; no murmurs, clicks, rubs,  or gallops. Abdomen: Non-distended, normal bowel sounds.  Soft and nontender without appreciable mass or hepatosplenomegaly.  Pulses:  Normal pulses noted. Extremities:  Without clubbing or edema.  Impression/Plan:

## 2011-06-04 NOTE — Assessment & Plan Note (Signed)
[name] weight loss has reverted at least for now. Does report chronic diarrhea most likely secondary to irritable bowel syndrome given the extensive workup he has had to date. I am somewhat disturbed by his report have esophageal dysphagia to solid food. He previously had his esophagus dilated.  Recommendations: Barium pill esophagram to evaluate swallowing difficulties  Recommend 1 Imodium each morning as a scheduled medication to diminish stool frequency  Plan office visit in 6 months  Recommendations to follow pending review of the PE.  Complete smoking cessation recommended.

## 2011-06-04 NOTE — Patient Instructions (Signed)
We will get a barium pill esophagram to evaluate your swallowing difficulties.  I'm okay with you continuing omeprazole for nowl  Take one Imodium each morning to help with loose stools  Further recommendations to follow.  Plan for followup appointment here in 6 months

## 2011-06-11 ENCOUNTER — Other Ambulatory Visit (HOSPITAL_COMMUNITY): Payer: Self-pay | Admitting: Oncology

## 2011-06-11 ENCOUNTER — Other Ambulatory Visit: Payer: Self-pay | Admitting: Oncology

## 2011-06-11 NOTE — Telephone Encounter (Signed)
May refill X 1 but after this needs approval by Primary MD-Dr Gerda Diss

## 2011-06-28 ENCOUNTER — Other Ambulatory Visit (HOSPITAL_COMMUNITY): Payer: BC Managed Care – PPO

## 2011-06-30 ENCOUNTER — Ambulatory Visit (HOSPITAL_COMMUNITY): Payer: BC Managed Care – PPO | Admitting: Oncology

## 2011-07-01 ENCOUNTER — Encounter (HOSPITAL_COMMUNITY): Payer: BC Managed Care – PPO | Attending: Oncology

## 2011-07-01 DIAGNOSIS — R61 Generalized hyperhidrosis: Secondary | ICD-10-CM

## 2011-07-01 DIAGNOSIS — R630 Anorexia: Secondary | ICD-10-CM | POA: Insufficient documentation

## 2011-07-01 DIAGNOSIS — D72829 Elevated white blood cell count, unspecified: Secondary | ICD-10-CM | POA: Insufficient documentation

## 2011-07-01 LAB — BASIC METABOLIC PANEL
CO2: 29 mEq/L (ref 19–32)
Chloride: 102 mEq/L (ref 96–112)
Glucose, Bld: 117 mg/dL — ABNORMAL HIGH (ref 70–99)
Sodium: 138 mEq/L (ref 135–145)

## 2011-07-01 LAB — CBC
MCH: 31.8 pg (ref 26.0–34.0)
MCHC: 36 g/dL (ref 30.0–36.0)
MCV: 88.2 fL (ref 78.0–100.0)
Platelets: 189 10*3/uL (ref 150–400)

## 2011-07-01 LAB — DIFFERENTIAL
Basophils Relative: 0 % (ref 0–1)
Eosinophils Absolute: 0.1 10*3/uL (ref 0.0–0.7)
Eosinophils Relative: 1 % (ref 0–5)
Monocytes Relative: 4 % (ref 3–12)
Neutrophils Relative %: 67 % (ref 43–77)

## 2011-07-01 NOTE — Progress Notes (Signed)
Labs drawn today for cbc/diff,bmp 

## 2011-07-03 ENCOUNTER — Other Ambulatory Visit: Payer: Self-pay | Admitting: Cardiology

## 2011-07-03 LAB — LIPID PANEL
Cholesterol: 148 mg/dL (ref 0–200)
LDL Cholesterol: 91 mg/dL (ref 0–99)
Triglycerides: 136 mg/dL (ref <150)

## 2011-07-08 ENCOUNTER — Telehealth (HOSPITAL_COMMUNITY): Payer: Self-pay | Admitting: *Deleted

## 2011-07-12 ENCOUNTER — Encounter (HOSPITAL_COMMUNITY): Payer: Self-pay | Admitting: Oncology

## 2011-07-12 ENCOUNTER — Encounter (HOSPITAL_BASED_OUTPATIENT_CLINIC_OR_DEPARTMENT_OTHER): Payer: BC Managed Care – PPO | Admitting: Oncology

## 2011-07-12 VITALS — BP 109/75 | HR 67 | Temp 98.1°F | Wt 171.6 lb

## 2011-07-12 DIAGNOSIS — D72829 Elevated white blood cell count, unspecified: Secondary | ICD-10-CM

## 2011-07-12 DIAGNOSIS — R911 Solitary pulmonary nodule: Secondary | ICD-10-CM

## 2011-07-12 DIAGNOSIS — J449 Chronic obstructive pulmonary disease, unspecified: Secondary | ICD-10-CM

## 2011-07-12 DIAGNOSIS — R5381 Other malaise: Secondary | ICD-10-CM

## 2011-07-12 NOTE — Progress Notes (Signed)
[name] Punt, MD, MD [address] [place] [address]  1. Leukocytosis  CBC, Differential  2. Lung nodule  CT Chest Wo Contrast    CURRENT THERAPY:Observation  INTERVAL HISTORY: [name] 56 y.o. male returns for  regular  visit for followup of leukocytosis that is likely reactive to irritable bowel syndrome and lung disease secondary to tobacco abuse.   The patient reports that he has quit smoking unless he is around other people smoking.  He reports that he averages 2-3 cigarettes daily.  I have encouraged him to quit altogether.  Otherwise, the patient is following up with Dr. Jena [name] (GI) for diarrhea secondary to Irritable bowel syndrome and dysphagia to solids.  He is considering a barium swallow esophagram.  I personally reviewed and went over laboratory results with the patient. Most recently, his WBC count and Hgb is within normal limits.  We spent time reviewing this lab work.  I personally reviewed and went over radiographic studies with the patient. We will perform a follow-up CT of chest this week or next week to evaluate a slowly growing nodule.   Past Medical History  Diagnosis Date  . ASCVD (arteriosclerotic cardiovascular disease)     inferior MI with circumflex PTCA in 1993;cath in 2000-50% OM 1&2,30% LAD ;2008-25% LAD, 80% nondominant right ,60% small OM 2,EF of 55% with severe basilar inferior hypokinesis; stress nuclear study in 04/2008 moderate inferior scar with peri-infarction ischemic  . Syncope   . Hyperlipidemia   . Hypertension   . Tobacco abuse     50 pack years continuing at one halp pack daily  . COPD (chronic obstructive pulmonary disease)     mild ;excercise induced hypoxemia by cp stress test ;asthma ,bronchitis,  . Restless leg syndrome   . Anxiety and depression   . GERD (gastroesophageal reflux disease)   . Inflammatory polyps of colon with rectal bleeding   . Carcinoma in situ of colon 2004    rectal polyp  . H. pylori infection 2004     treated  . Sleep apnea     does not use CPAP  . Gastritis 12/30/10    EGD Dr [name]  . Colitis, ischemic 2011  . Tubular adenoma of colon 06/2009    Colonosocpy Dr [name]  . Leukocytosis     Dr [name]  . Depression   . Anxiety     has Hyperlipidemia; ANXIETY; TOBACCO ABUSE; DEPRESSION; OBSTRUCTIVE SLEEP APNEA; HYPERTENSION; ATHEROSCLEROTIC CARDIOVASCULAR DISEASE; COPD; Epigastric pain; Weight loss, abnormal; Anorexia; Esophageal dysphagia; Leukocytosis; and RLS (restless legs syndrome) on his problem list.      has no known allergies.  [name] does not currently have medications on file.  Past Surgical History  Procedure Date  . Colonoscopy w/ polypectomy 2004    rectal polyp with carcinoma in situ removed via colonoscopy  . Hand surgery     surgical intervention for injury of the fingers of the left hand many years ago  . Cholecystectomy 2004  . Colonoscopy 06/2009    normal terminal ileum, segmental mild inflammation of sigmoid colon (bx unremarkable), polyp, tubular adenoma  . Esophagogastroduodenoscopy 02/2009    query Barrett's but bx negative  . Esophagogastroduodenoscopy 12/30/2010    [name],gastritis, dilated 13F, sm HH, 1 small ulcer, Duodenal erosions, benign bx  . Flexible sigmoidoscopy 12/30/2010     [name],; internal hemorrhoids, anal papilla  . Heart stent     Denies any headaches, dizziness, double vision, fevers, chills, night sweats,  nausea, vomiting, diarrhea, constipation, chest pain, heart palpitations, shortness of breath, blood in stool, black tarry stool, urinary pain, urinary burning, urinary frequency, hematuria.   PHYSICAL EXAMINATION  ECOG PERFORMANCE STATUS: 1 - Symptomatic but completely ambulatory  Filed Vitals:   07/12/11 1050  BP:   Pulse: 67  Temp: 98.1 F (36.7 C)    GENERAL:alert, no distress, well nourished, well developed, comfortable, cooperative and smiling SKIN: skin color, texture, turgor are normal,  no rashes or significant lesions HEAD: Normocephalic, No masses, lesions, tenderness or abnormalities EYES: normal EARS: External ears normal OROPHARYNX:mucous membranes are moist  NECK: supple, no adenopathy, no bruits, thyroid normal size, non-tender, without nodularity, no stridor, non-tender, trachea midline LYMPH:  no palpable lymphadenopathy BREAST:not examined LUNGS: clear to auscultation and percussion, decreased breath sounds HEART: regular rate & rhythm, no murmurs, no gallops, S1 normal and S2 normal ABDOMEN:abdomen soft, non-tender, normal bowel sounds and no masses or organomegaly BACK: Back symmetric, no curvature., No CVA tenderness EXTREMITIES:less then 2 second capillary refill, no joint deformities, effusion, or inflammation, no edema, no skin discoloration, no clubbing, no cyanosis  NEURO: alert & oriented x 3 with fluent speech, no focal motor/sensory deficits, gait normal   LABORATORY DATA:  Results for [name] (MRN [id number]) as of 07/12/2011 14:52  Ref. Range 03/12/2011 10:45 07/01/2011 11:22  WBC Latest Range: 4.0-10.5 K/uL 13.6 (H) 7.5  RBC Latest Range: 4.22-5.81 MIL/uL 5.21 5.10  HGB Latest Range: 13.0-17.0 g/dL 78.2 (H) 95.6  HCT Latest Range: 39.0-52.0 % 47.0 45.0  MCV Latest Range: 78.0-100.0 fL 90.2 88.2  MCH Latest Range: 26.0-34.0 pg 33.2 31.8  MCHC Latest Range: 30.0-36.0 g/dL 21.3 (H) 08.6  RDW Latest Range: 11.5-15.5 % 12.9 12.9  Platelets Latest Range: 150-400 K/uL 186 189     RADIOGRAPHIC STUDIES: 03/22/2011 *RADIOLOGY REPORT*  Clinical Data: Smoker. Increased risk for lung cancer. Increase  shortness of breath and fatigue.  CT CHEST WITHOUT CONTRAST  Technique: Multidetector CT imaging of the chest was performed  following the standard protocol without IV contrast.  Comparison: 09/14/2004  Findings:  No enlarged supraclavicular or axillary lymph node.  No mediastinal or hilar lymph nodes.  There is no pericardial or pleural  effusions.  Lungs are emphysematous. There is a large bulla within the right  lung apex.  There is a pulmonary nodule within the left lower lobe which  measures 7.5 by 6.6 mm, image 24. Previously this measured 5.4 by  4.5 mm.  No additional suspicious pulmonary nodules or masses identified.  Review of the visualized bony structures is unremarkable.  No worrisome lytic or sclerotic lesions identified.  Limited imaging through the upper abdomen is unremarkable.  IMPRESSION:  1. There is a small sub centimeter nodule in the left lower lobe  which currently measures 7.5 x 6.6 mm. Previously 5.4 x 4.5 mm.  The small slow-growing pulmonary neoplasm cannot be excluded.  2. Bullous emphysema.  Original Report Authenticated By: Rosealee Albee, M.D.     ASSESSMENT:  1. Erythrocytosis, secondary to lung disease, within normal limits most recently  2. Leukocytosis, secondary to IBS and/or lung disease, within normal limits most recently 3. COPD  4. IBS 5. Diarrhea, followed by GI 6. Dysphagia, followed by GI 7. Tobacco abuse 30+ pack years, currently smoking up to 1 ppd. Began smoking at the age of 89  8. Shortness of breath  9. Fatigue  10. Night sweats  11. Decreased appetite   PLAN:  1. I personally reviewed and  went over laboratory results with the patient. 2. I personally reviewed and went over radiographic studies with the patient. 3. Lab work monthly: CBC diff 4. CT of chest without contrast end of Jan 2013 or beginning of Feb 2013. 5. Return in 3 months for follow-up.   All questions were answered. The patient knows to call the clinic with any problems, questions or concerns. We can certainly see the patient much sooner if necessary.  The patient and plan discussed with Glenford Peers, MD and he is in agreement with the aforementioned.  I spent 20 minutes counseling the patient face to face. The total time spent in the appointment was 30 minutes.  Jariana Shumard

## 2011-07-12 NOTE — Patient Instructions (Signed)
[facility]  Discharge Instructions  RECOMMENDATIONS MADE BY THE CONSULTANT AND ANY TEST RESULTS WILL BE SENT TO YOUR REFERRING DOCTOR.   EXAM FINDINGS BY MD TODAY AND SIGNS AND SYMPTOMS TO REPORT TO CLINIC OR PRIMARY MD: We will continue lab work monthly for the next 3 months. We will schedule you for a CT scan of your chest to follow up on the lung nodule. Return to clinic in 3 months to see MD. Report any problems or concerns to this clinic as needed.    I acknowledge that I have been informed and understand all the instructions given to me and received a copy. I do not have any more questions at this time, but understand that I may call the Specialty [facility] at [facility] at [phone] during business hours should I have any further questions or need assistance in obtaining follow-up care.    __________________________________________  _____________  __________ Signature of Patient or Authorized Representative            Date                   Time    __________________________________________ Nurse's Signature

## 2011-07-16 ENCOUNTER — Encounter (HOSPITAL_COMMUNITY): Payer: Self-pay | Admitting: Oncology

## 2011-07-16 ENCOUNTER — Other Ambulatory Visit (HOSPITAL_COMMUNITY): Payer: Self-pay | Admitting: Oncology

## 2011-07-16 ENCOUNTER — Ambulatory Visit (HOSPITAL_COMMUNITY)
Admission: RE | Admit: 2011-07-16 | Discharge: 2011-07-16 | Disposition: A | Discharge status: Home / Self Care | Payer: BC Managed Care – PPO | Source: Ambulatory Visit | Attending: Oncology | Admitting: Oncology

## 2011-07-16 DIAGNOSIS — R911 Solitary pulmonary nodule: Secondary | ICD-10-CM

## 2011-07-16 DIAGNOSIS — J984 Other disorders of lung: Secondary | ICD-10-CM | POA: Insufficient documentation

## 2011-07-16 DIAGNOSIS — J438 Other emphysema: Secondary | ICD-10-CM | POA: Insufficient documentation

## 2011-07-16 HISTORY — DX: Solitary pulmonary nodule: R91.1

## 2011-07-30 ENCOUNTER — Ambulatory Visit (INDEPENDENT_AMBULATORY_CARE_PROVIDER_SITE_OTHER): Payer: BC Managed Care – PPO | Admitting: Psychiatry

## 2011-07-30 ENCOUNTER — Encounter (HOSPITAL_COMMUNITY): Payer: Self-pay | Admitting: Psychiatry

## 2011-07-30 DIAGNOSIS — F3289 Other specified depressive episodes: Secondary | ICD-10-CM

## 2011-07-30 DIAGNOSIS — F32A Depression, unspecified: Secondary | ICD-10-CM

## 2011-07-30 DIAGNOSIS — F329 Major depressive disorder, single episode, unspecified: Secondary | ICD-10-CM

## 2011-07-30 MED ORDER — GABAPENTIN 300 MG PO CAPS: 300.0000 mg | ORAL_CAPSULE | Freq: Three times a day (TID) | ORAL | Status: DC

## 2011-07-30 NOTE — Progress Notes (Signed)
Patient ID: [name], male   DOB: [birth year], 56 y.o.   MRN: [id number] Still not eating or sleeping well.  Takes meds regularly.  Feels anxious about many things like kids.  Increased irritabililty, thinks about hurting himself but no plans.

## 2011-08-09 ENCOUNTER — Other Ambulatory Visit: Payer: Self-pay | Admitting: Cardiology

## 2011-08-27 ENCOUNTER — Telehealth: Payer: Self-pay | Admitting: *Deleted

## 2011-08-27 NOTE — Telephone Encounter (Signed)
Appointment made for surgical clearance 3/22.  Pt notified

## 2011-08-31 ENCOUNTER — Ambulatory Visit (INDEPENDENT_AMBULATORY_CARE_PROVIDER_SITE_OTHER): Payer: BC Managed Care – PPO | Admitting: Cardiology

## 2011-08-31 ENCOUNTER — Encounter: Payer: Self-pay | Admitting: Cardiology

## 2011-08-31 ENCOUNTER — Ambulatory Visit (HOSPITAL_COMMUNITY)
Admission: RE | Admit: 2011-08-31 | Discharge: 2011-08-31 | Disposition: A | Discharge status: Home / Self Care | Payer: BC Managed Care – PPO | Source: Ambulatory Visit | Attending: Cardiology | Admitting: Cardiology

## 2011-08-31 ENCOUNTER — Other Ambulatory Visit: Payer: Self-pay | Admitting: Cardiology

## 2011-08-31 ENCOUNTER — Encounter: Payer: Self-pay | Admitting: *Deleted

## 2011-08-31 VITALS — BP 129/90 | HR 81 | Ht 70.0 in | Wt 176.0 lb

## 2011-08-31 DIAGNOSIS — F3289 Other specified depressive episodes: Secondary | ICD-10-CM

## 2011-08-31 DIAGNOSIS — F329 Major depressive disorder, single episode, unspecified: Secondary | ICD-10-CM

## 2011-08-31 DIAGNOSIS — I10 Essential (primary) hypertension: Secondary | ICD-10-CM

## 2011-08-31 DIAGNOSIS — I2510 Atherosclerotic heart disease of native coronary artery without angina pectoris: Secondary | ICD-10-CM

## 2011-08-31 DIAGNOSIS — J449 Chronic obstructive pulmonary disease, unspecified: Secondary | ICD-10-CM | POA: Insufficient documentation

## 2011-08-31 DIAGNOSIS — R0989 Other specified symptoms and signs involving the circulatory and respiratory systems: Secondary | ICD-10-CM

## 2011-08-31 DIAGNOSIS — J4489 Other specified chronic obstructive pulmonary disease: Secondary | ICD-10-CM

## 2011-08-31 DIAGNOSIS — E785 Hyperlipidemia, unspecified: Secondary | ICD-10-CM

## 2011-08-31 DIAGNOSIS — R0609 Other forms of dyspnea: Secondary | ICD-10-CM

## 2011-08-31 DIAGNOSIS — Z01818 Encounter for other preprocedural examination: Secondary | ICD-10-CM | POA: Insufficient documentation

## 2011-08-31 DIAGNOSIS — F17200 Nicotine dependence, unspecified, uncomplicated: Secondary | ICD-10-CM

## 2011-08-31 MED ORDER — VERAPAMIL HCL ER 180 MG PO TBCR: 180.0000 mg | EXTENDED_RELEASE_TABLET | Freq: Every day | ORAL | Status: DC

## 2011-08-31 NOTE — Assessment & Plan Note (Addendum)
Coronary disease has been fairly mild and stable since acute inferior MI in 1993.  Risk of proposed surgical procedure is relatively low.  Due to symptoms compatible with CHF, a chest x-ray, echocardiogram and BNP level will be obtained as well as basic laboratory studies.  No significant pathologic findings are identified, surgery can proceed without additional treatment or preparation.  Pedal edema will be addressed by substituting verapamil for amlodipine and instituting a low salt diet.

## 2011-08-31 NOTE — Patient Instructions (Addendum)
Your physician recommends that you schedule a follow-up appointment in:  Keep scheduled follow up, due in August  A chest x-ray takes a picture of the organs and structures inside the chest, including the heart, lungs, and blood vessels. This test can show several things, including, whether the heart is enlarges; whether fluid is building up in the lungs; and whether pacemaker / defibrillator leads are still in place.  STOP Smoking (Information enclosed) Low Salt diet (Information enclosed)  Your physician has requested that you have an echocardiogram. Echocardiography is a painless test that uses sound waves to create images of your heart. It provides your doctor with information about the size and shape of your heart and how well your heart's chambers and valves are working. This procedure takes approximately one hour. There are no restrictions for this procedure.  Ok for surgery - Letter will be sent  Your physician recommends that you return for lab work in: Today  Your physician has recommended you make the following change in your medication:  1 - STOP Amlodipine 2 - START Verapamil 180 mg daily

## 2011-08-31 NOTE — Assessment & Plan Note (Signed)
Patient encouraged to discontinue cigarette smoking entirely. 

## 2011-08-31 NOTE — Progress Notes (Signed)
Patient ID: [name], male   DOB: [birth year], 56 y.o.   MRN: [id number] HPI: Preoperative consultation requested by Dr. Pollyann Kennedy prior to planned septoplasty and bilateral turbinate reduction for management of chronic nasal congestion.  Patient has been stable with respect to his other medical problems, which are numerous.  He notes discomfort in the lower extremities below the knees for which gabapentin dosage is currently being adjusted.  Sleep apnea and bullous emphysema is currently being managed by Dr. Shelle Iron.  Coronary disease, hypertension and hyperlipidemia have been under excellent control, but patient has chronic class 2-3 dyspnea on exertion, intermittent pedal edema and nocturnal dyspnea for which he sleeps in a chair.  He reports that he has decreased cigarette consumption to fewer than one cigarette per day.  Current Outpatient Prescriptions on File Prior to Visit  Medication Sig Dispense Refill  . albuterol (PROVENTIL) (2.5 MG/3ML) 0.083% nebulizer solution Take 2.5 mg by nebulization every 6 (six) hours as needed.        Marland Kitchen albuterol-ipratropium (COMBIVENT) 18-103 MCG/ACT inhaler Inhale 2 puffs into the lungs every 6 (six) hours as needed.        . ALPRAZolam (XANAX) 1 MG tablet Take 1 tablet (1 mg total) by mouth 4 (four) times daily as needed.  120 tablet  3  . amLODipine (NORVASC) 5 MG tablet Take 5 mg by mouth daily.       Marland Kitchen aspirin 81 MG tablet Take 81 mg by mouth daily.        Marland Kitchen atorvastatin (LIPITOR) 80 MG tablet Take 1 tablet (80 mg total) by mouth daily.  90 tablet  3  . budesonide-formoterol (SYMBICORT) 80-4.5 MCG/ACT inhaler Inhale 2 puffs into the lungs 2 (two) times daily.        . Cyanocobalamin (VITAMIN B-12) 2000 MCG TBCR Take 1 tablet by mouth 1 dose over 46 hours.        . gabapentin (NEURONTIN) 300 MG capsule Take 1 capsule (300 mg total) by mouth 3 (three) times daily.  90 capsule  12  . HYDROcodone-acetaminophen (LORTAB) 7.5-500 MG per tablet Take 1 tablet by mouth  every 6 (six) hours as needed.        . metoprolol succinate (TOPROL-XL) 25 MG 24 hr tablet TAKE 1 TABLET BY MOUTH EVERY DAY  30 tablet  6  . OLANZapine (ZYPREXA) 5 MG tablet Take 5 mg by mouth at bedtime.        Marland Kitchen omeprazole (PRILOSEC) 20 MG capsule Take 20 mg by mouth daily.        . pantoprazole (PROTONIX) 40 MG tablet TAKE 1 TABLET BY MOUTH EVERY DAY  30 tablet  3  . pravastatin (PRAVACHOL) 40 MG tablet TAKE 1 TABLET EVERY DAY  30 tablet  6  . rOPINIRole (REQUIP) 0.5 MG tablet Take as directed  60 tablet  0  . Testosterone (ANDROGEL PUMP) 1.25 GM/ACT (1%) GEL Place onto the skin 4 (four) times daily.          No Known Allergies    Past Medical History  Diagnosis Date  . ASCVD (arteriosclerotic cardiovascular disease)     inferior MI with circumflex PTCA in 1993;cath in 2000-50% OM 1&2,30% LAD ;2008-25% LAD, 80% nondominant right ,60% small OM 2,EF of 55% with severe basilar inferior hypokinesis; stress nuclear study in 04/2008 moderate inferior scar with peri-infarction ischemic  . Syncope   . Hyperlipidemia   . Hypertension   . Tobacco abuse     50 pack years  continuing at one halp pack daily  . COPD (chronic obstructive pulmonary disease)     mild ;excercise induced hypoxemia by cp stress test ;asthma ,bronchitis,  . Restless leg syndrome   . Anxiety and depression   . GERD (gastroesophageal reflux disease)   . Inflammatory polyps of colon with rectal bleeding   . Carcinoma in situ of colon 2004    rectal polyp  . H. pylori infection 2004    treated  . Sleep apnea     does not use CPAP  . Gastritis 12/30/10    EGD Dr Jena Gauss  . Colitis, ischemic 2011  . Tubular adenoma of colon 06/2009    Colonosocpy Dr Jena Gauss  . Leukocytosis     Dr Shon Baton  . Depression   . Anxiety   . Lung nodule 07/16/2011     Past Surgical History  Procedure Date  . Colonoscopy w/ polypectomy 2004    rectal polyp with carcinoma in situ removed via colonoscopy  . Hand surgery     surgical  intervention for injury of the fingers of the left hand many years ago  . Cholecystectomy 2004  . Colonoscopy 06/2009    normal terminal ileum, segmental mild inflammation of sigmoid colon (bx unremarkable), polyp, tubular adenoma  . Esophagogastroduodenoscopy 02/2009    query Barrett's but bx negative  . Esophagogastroduodenoscopy 12/30/2010    [name],gastritis, dilated 53F, sm HH, 1 small ulcer, Duodenal erosions, benign bx  . Flexible sigmoidoscopy 12/30/2010     [name],; internal hemorrhoids, anal papilla  . Heart stent      Family History  Problem Relation Age of Onset  . Lung disease Father     deceased, black lung  . Heart disease Mother     blood clots  . Cancer Paternal Uncle     unknown type  . Colon cancer Neg Hx   . Cancer Maternal Aunt     unknown type  . Kidney failure Maternal Uncle      History   Social History  . Marital Status: Married    Spouse Name: N/A    Number of Children: 3  . Years of Education: N/A   Occupational History  . disable     [employer]   Social History Main Topics  . Smoking status: Current Some Day Smoker -- 0.5 packs/day for 30 years    Types: Cigarettes    Last Attempt to Quit: 01/13/2011  . Smokeless tobacco: Never Used  . Alcohol Use: 1.2 oz/week    2 Cans of beer per week     Drinks a beer occasionally  . Drug Use: No  . Sexually Active: Not on file   Other Topics Concern  . Not on file   Social History Narrative   3 stepchildren   ROS: Denies palpitations, chest discomfort, lightheadedness, chronic sputum production.     All other systems reviewed and are negative.  PHYSICAL EXAM: BP 129/90  Pulse 81  Ht 5\' 10"  (1.778 m)  Wt 79.833 kg (176 lb)  BMI 25.25 kg/m2  SpO2 98%; 11 pound weight gain since previous visit General-Well-developed; no acute distress Body Habitus-proportionate weight and height HEENT-[name]; PERRL; EOM intact; conjunctiva and lids nl Neck-No JVD; no carotid bruits Endocrine-No  thyromegaly Lungs-Clear lung fields; resonant percussion; normal I-to-E ratio Cardiovascular- normal PMI; normal S1 and S2 Abdomen-BS normal; soft and non-tender without masses or organomegaly Musculoskeletal-No deformities, cyanosis or clubbing Neurologic-Nl cranial nerves; symmetric strength and tone Skin- Warm, no  significant lesions Extremities-Nl distal pulses; no edema  EKG:  Normal sinus rhythm; right ventricular conduction delay; left atrial abnormality; possible previous inferior myocardial infarction.  No previous tracing for comparison.  ASSESSMENT AND PLAN:  [place] Bing, MD 08/31/2011 1:57 PM

## 2011-08-31 NOTE — Assessment & Plan Note (Signed)
Patient is encouraged to discontinue cigarette smoking entirely.

## 2011-08-31 NOTE — Assessment & Plan Note (Signed)
Lipid profile was excellent when last assessed fewer than 12 months ago.

## 2011-08-31 NOTE — Assessment & Plan Note (Signed)
03/2011-small nodule in the left lower lobe, 7.5 x 6.6 mm.  Previously 5.4 x 4.5 mm.   Pulmonary neoplasm cannot be excluded.  Bullous emphysema.; continued CT surveillance

## 2011-08-31 NOTE — Assessment & Plan Note (Signed)
Patient reports that he has been eating well, and he has gained 11 pounds since his most recent office visit.  Anorexia does not appear to be a current problem.

## 2011-09-01 ENCOUNTER — Ambulatory Visit (HOSPITAL_COMMUNITY)
Admission: RE | Admit: 2011-09-01 | Discharge: 2011-09-01 | Disposition: A | Discharge status: Home / Self Care | Payer: BC Managed Care – PPO | Source: Ambulatory Visit | Attending: Cardiology | Admitting: Cardiology

## 2011-09-01 DIAGNOSIS — E785 Hyperlipidemia, unspecified: Secondary | ICD-10-CM | POA: Insufficient documentation

## 2011-09-01 DIAGNOSIS — I517 Cardiomegaly: Secondary | ICD-10-CM

## 2011-09-01 DIAGNOSIS — J4489 Other specified chronic obstructive pulmonary disease: Secondary | ICD-10-CM | POA: Insufficient documentation

## 2011-09-01 DIAGNOSIS — I2510 Atherosclerotic heart disease of native coronary artery without angina pectoris: Secondary | ICD-10-CM

## 2011-09-01 DIAGNOSIS — I10 Essential (primary) hypertension: Secondary | ICD-10-CM | POA: Insufficient documentation

## 2011-09-01 DIAGNOSIS — J449 Chronic obstructive pulmonary disease, unspecified: Secondary | ICD-10-CM

## 2011-09-01 DIAGNOSIS — R0989 Other specified symptoms and signs involving the circulatory and respiratory systems: Secondary | ICD-10-CM | POA: Insufficient documentation

## 2011-09-01 DIAGNOSIS — R0609 Other forms of dyspnea: Secondary | ICD-10-CM | POA: Insufficient documentation

## 2011-09-01 LAB — CBC
RBC: 5.22 MIL/uL (ref 4.22–5.81)
WBC: 7.8 10*3/uL (ref 4.0–10.5)

## 2011-09-01 LAB — COMPREHENSIVE METABOLIC PANEL
ALT: 31 U/L (ref 0–53)
CO2: 27 mEq/L (ref 19–32)
Calcium: 9.1 mg/dL (ref 8.4–10.5)
Chloride: 104 mEq/L (ref 96–112)
Creat: 0.83 mg/dL (ref 0.50–1.35)

## 2011-09-01 LAB — BRAIN NATRIURETIC PEPTIDE: Brain Natriuretic Peptide: 15.8 pg/mL (ref 0.0–100.0)

## 2011-09-01 NOTE — Progress Notes (Signed)
*  PRELIMINARY RESULTS* Echocardiogram 2D Echocardiogram has been performed.  [name]  09/01/2011, 11:12 AM

## 2011-09-03 ENCOUNTER — Ambulatory Visit: Payer: BC Managed Care – PPO | Admitting: Cardiology

## 2011-09-06 ENCOUNTER — Encounter: Payer: Self-pay | Admitting: *Deleted

## 2011-09-09 ENCOUNTER — Ambulatory Visit: Payer: BC Managed Care – PPO | Admitting: Cardiology

## 2011-09-13 ENCOUNTER — Ambulatory Visit: Payer: BC Managed Care – PPO | Admitting: Cardiology

## 2011-09-24 ENCOUNTER — Ambulatory Visit (HOSPITAL_COMMUNITY): Payer: BC Managed Care – PPO | Admitting: Psychiatry

## 2011-09-27 ENCOUNTER — Encounter (HOSPITAL_COMMUNITY): Payer: Self-pay | Admitting: Pharmacy Technician

## 2011-09-28 ENCOUNTER — Other Ambulatory Visit (HOSPITAL_COMMUNITY): Payer: Self-pay

## 2011-09-30 ENCOUNTER — Encounter (HOSPITAL_COMMUNITY)
Admission: RE | Admit: 2011-09-30 | Discharge: 2011-09-30 | Disposition: A | Discharge status: Home / Self Care | Payer: BC Managed Care – PPO | Source: Ambulatory Visit | Attending: Otolaryngology | Admitting: Otolaryngology

## 2011-09-30 ENCOUNTER — Encounter (HOSPITAL_COMMUNITY): Payer: Self-pay

## 2011-09-30 HISTORY — DX: Acute myocardial infarction, unspecified: I21.9

## 2011-09-30 LAB — SURGICAL PCR SCREEN: MRSA, PCR: NEGATIVE

## 2011-09-30 LAB — CBC
Hemoglobin: 16.1 g/dL (ref 13.0–17.0)
MCV: 86.4 fL (ref 78.0–100.0)
Platelets: 167 10*3/uL (ref 150–400)
RBC: 5.08 MIL/uL (ref 4.22–5.81)
WBC: 7 10*3/uL (ref 4.0–10.5)

## 2011-09-30 LAB — COMPREHENSIVE METABOLIC PANEL
Albumin: 3.9 g/dL (ref 3.5–5.2)
BUN: 6 mg/dL (ref 6–23)
Calcium: 9.4 mg/dL (ref 8.4–10.5)
Chloride: 106 mEq/L (ref 96–112)
Creatinine, Ser: 0.83 mg/dL (ref 0.50–1.35)
Total Bilirubin: 0.5 mg/dL (ref 0.3–1.2)

## 2011-09-30 NOTE — Pre-Procedure Instructions (Signed)
20 DEXTER SIGNOR  09/30/2011   Your procedure is scheduled on:  October 06, 2011  Report to [facility] at 9:30 AM.  Call this number if you have problems the morning of surgery: [phone]   Remember:   Do not eat food:After Midnight.  May have clear liquids: up to 4 Hours before arrival.  Clear liquids include soda, tea, black coffee, apple or grape juice, broth.  Take these medicines the morning of surgery with A SIP OF WATER: INHALER, XANAX AS NEEDED, PAIN PILL AS NEEDED, TOPROL, PROTONIX     STOP ASPIRIN   Do not wear jewelry, make-up or nail polish.  Do not wear lotions, powders, or perfumes. You may wear deodorant.  Do not shave 48 hours prior to surgery.  Do not bring valuables to the hospital.  Contacts, dentures or bridgework may not be worn into surgery.  Leave suitcase in the car. After surgery it may be brought to your room.  For patients admitted to the hospital, checkout time is 11:00 AM the day of discharge.   Patients discharged the day of surgery will not be allowed to drive home.  Name and phone number of your driver: [email or web address] buysse [phone]  Special Instructions: CHG Shower Use Special Wash: 1/2 bottle night before surgery and 1/2 bottle morning of surgery.   Please read over the following fact sheets that you were given: Pain Booklet, Coughing and Deep Breathing and Surgical Site Infection Prevention

## 2011-09-30 NOTE — Progress Notes (Signed)
No orders, Darl Pikes in office notified; Dr Pollyann Kennedy in OR try to contact him for orders.  Contacted shalonda in OR Dr Pollyann Kennedy not in surgery will obtain labs per anesthesia

## 2011-10-02 NOTE — H&P (Signed)
[name] is an 56 y.o. male.   Chief Complaint: nasal obstruction HPI: long history of difficulty breathing through the nose.  Past Medical History  Diagnosis Date  . ASCVD (arteriosclerotic cardiovascular disease)     inferior MI with circumflex PTCA in 1993;cath in 2000-50% OM 1&2,30% LAD ;2008-25% LAD, 80% nondominant right ,60% small OM 2,EF of 55% with severe basilar inferior hypokinesis; stress nuclear study in 04/2008 moderate inferior scar with peri-infarction ischemic  . Syncope   . Hyperlipidemia   . Hypertension   . Tobacco abuse     50 pack years continuing at one halp pack daily  . COPD (chronic obstructive pulmonary disease)     mild ;excercise induced hypoxemia by cp stress test ;asthma ,bronchitis,  . Restless leg syndrome   . Anxiety and depression   . GERD (gastroesophageal reflux disease)   . Inflammatory polyps of colon with rectal bleeding   . Carcinoma in situ of colon 2004    rectal polyp  . H. pylori infection 2004    treated  . Sleep apnea     does not use CPAP  . Gastritis 12/30/10    EGD Dr [name]  . Colitis, ischemic 2011  . Tubular adenoma of colon 06/2009    Colonosocpy Dr [name]  . Leukocytosis     Dr [name]  . Depression   . Anxiety   . Lung nodule 07/16/2011  . Myocardial infarction   . Emphysema of lung     Past Surgical History  Procedure Date  . Colonoscopy w/ polypectomy 2004    rectal polyp with carcinoma in situ removed via colonoscopy  . Hand surgery     surgical intervention for injury of the fingers of the left hand many years ago  . Cholecystectomy 2004  . Colonoscopy 06/2009    normal terminal ileum, segmental mild inflammation of sigmoid colon (bx unremarkable), polyp, tubular adenoma  . Esophagogastroduodenoscopy 02/2009    query Barrett's but bx negative  . Esophagogastroduodenoscopy 12/30/2010    [name],gastritis, dilated 40F, sm HH, 1 small ulcer, Duodenal erosions, benign bx  . Flexible sigmoidoscopy  12/30/2010     [name],; internal hemorrhoids, anal papilla  . Heart stent   . Cardiac catheterization     Family History  Problem Relation Age of Onset  . Lung disease Father     deceased, black lung  . Heart disease Mother     blood clots  . Cancer Paternal Uncle     unknown type  . Colon cancer Neg Hx   . Cancer Maternal Aunt     unknown type  . Kidney failure Maternal Uncle    Social History:  reports that he has been smoking Cigarettes.  He has a 15 pack-year smoking history. He has never used smokeless tobacco. He reports that he drinks about 1.2 ounces of alcohol per week. He reports that he does not use illicit drugs.  Allergies: No Known Allergies  No current facility-administered medications on file as of .   Medications Prior to Admission  Medication Sig Dispense Refill  . albuterol (PROVENTIL) (2.5 MG/3ML) 0.083% nebulizer solution Take 2.5 mg by nebulization every 6 (six) hours as needed. For shortness of breath      . albuterol-ipratropium (COMBIVENT) 18-103 MCG/ACT inhaler Inhale 2 puffs into the lungs every 6 (six) hours as needed. For shortness of breath      . ALPRAZolam (XANAX) 1 MG tablet Take 1 mg by mouth 4 (four)  times daily as needed. For anxiety      . aspirin 81 MG tablet Take 81 mg by mouth daily.       Marland Kitchen atorvastatin (LIPITOR) 80 MG tablet Take 80 mg by mouth daily.      . budesonide-formoterol (SYMBICORT) 80-4.5 MCG/ACT inhaler Inhale 2 puffs into the lungs 2 (two) times daily.        Marland Kitchen gabapentin (NEURONTIN) 300 MG capsule Take 300-600 mg by mouth 2 (two) times daily. Patient takes 1 capsule in the morning and 2 capsules at night      . HYDROcodone-acetaminophen (LORTAB) 7.5-500 MG per tablet Take 1 tablet by mouth 2 (two) times daily as needed. For pain      . metoprolol succinate (TOPROL-XL) 25 MG 24 hr tablet Take 25 mg by mouth daily.      Marland Kitchen OLANZapine (ZYPREXA) 5 MG tablet Take 5 mg by mouth at bedtime.       . pantoprazole (PROTONIX) 40  MG tablet Take 40 mg by mouth daily.      . pravastatin (PRAVACHOL) 40 MG tablet Take 40 mg by mouth daily.      . Testosterone (ANDROGEL PUMP) 1.25 GM/ACT (1%) GEL Place onto the skin 4 (four) times daily.        . verapamil (CALAN-SR) 180 MG CR tablet Take 180 mg by mouth daily.        No results found for this or any previous visit (from the past 48 hour(s)). No results found.  ROS: otherwise negative  There were no vitals taken for this visit.  PHYSICAL EXAM: Overall appearance:  Healthy appearing, in no distress Head:  Normocephalic, atraumatic. Ears: External auditory canals are clear; tympanic membranes are intact and the middle ears are free of any effusion. Nose: External nose is healthy in appearance. Internal nasal exam free of any lesions . There is significant nasal septal deviation with bilateral obstruction. Oral Cavity:  There are no mucosal lesions or masses identified. Oral Pharynx/Hypopharynx/Larynx: no signs of any mucosal lesions or masses identified.  Neuro:  No identifiable neurologic deficits. Neck: No palpable neck masses.  Studies Reviewed: none    Assessment/Plan Recommend nasal septoplasty with reduction of turbinates.  [name] 10/02/2011, 3:52 PM

## 2011-10-05 ENCOUNTER — Telehealth (HOSPITAL_COMMUNITY): Payer: Self-pay | Admitting: *Deleted

## 2011-10-05 MED ORDER — CEFAZOLIN SODIUM 1-5 GM-% IV SOLN: 1.0000 g | INTRAVENOUS | Status: DC

## 2011-10-05 MED ORDER — CEFAZOLIN SODIUM-DEXTROSE 2-3 GM-% IV SOLR
2.0000 g | INTRAVENOUS | Status: AC
  Administered 2011-10-06: 2 g via INTRAVENOUS
  Filled 2011-10-05: qty 50

## 2011-10-05 MED ORDER — OXYMETAZOLINE HCL 0.05 % NA SOLN
2.0000 | NASAL | Status: DC
  Administered 2011-10-06 (×2): 2 via NASAL
  Filled 2011-10-05 (×2): qty 15

## 2011-10-06 ENCOUNTER — Ambulatory Visit (HOSPITAL_COMMUNITY): Payer: BC Managed Care – PPO | Admitting: Anesthesiology

## 2011-10-06 ENCOUNTER — Ambulatory Visit (HOSPITAL_COMMUNITY)
Admission: RE | Admit: 2011-10-06 | Discharge: 2011-10-06 | Disposition: A | Discharge status: Home / Self Care | Payer: BC Managed Care – PPO | Source: Ambulatory Visit | Attending: Otolaryngology | Admitting: Otolaryngology

## 2011-10-06 ENCOUNTER — Encounter (HOSPITAL_COMMUNITY): Payer: Self-pay | Admitting: Anesthesiology

## 2011-10-06 ENCOUNTER — Encounter (HOSPITAL_COMMUNITY): Payer: Self-pay | Admitting: *Deleted

## 2011-10-06 ENCOUNTER — Encounter (HOSPITAL_COMMUNITY)
Admission: RE | Disposition: A | Discharge status: Home / Self Care | Payer: Self-pay | Source: Ambulatory Visit | Attending: Otolaryngology

## 2011-10-06 DIAGNOSIS — F17200 Nicotine dependence, unspecified, uncomplicated: Secondary | ICD-10-CM | POA: Insufficient documentation

## 2011-10-06 DIAGNOSIS — E785 Hyperlipidemia, unspecified: Secondary | ICD-10-CM | POA: Insufficient documentation

## 2011-10-06 DIAGNOSIS — I10 Essential (primary) hypertension: Secondary | ICD-10-CM | POA: Insufficient documentation

## 2011-10-06 DIAGNOSIS — J4489 Other specified chronic obstructive pulmonary disease: Secondary | ICD-10-CM | POA: Insufficient documentation

## 2011-10-06 DIAGNOSIS — I252 Old myocardial infarction: Secondary | ICD-10-CM | POA: Insufficient documentation

## 2011-10-06 DIAGNOSIS — J449 Chronic obstructive pulmonary disease, unspecified: Secondary | ICD-10-CM | POA: Insufficient documentation

## 2011-10-06 DIAGNOSIS — J342 Deviated nasal septum: Secondary | ICD-10-CM

## 2011-10-06 DIAGNOSIS — I2510 Atherosclerotic heart disease of native coronary artery without angina pectoris: Secondary | ICD-10-CM | POA: Insufficient documentation

## 2011-10-06 DIAGNOSIS — K219 Gastro-esophageal reflux disease without esophagitis: Secondary | ICD-10-CM | POA: Insufficient documentation

## 2011-10-06 DIAGNOSIS — Z01812 Encounter for preprocedural laboratory examination: Secondary | ICD-10-CM | POA: Insufficient documentation

## 2011-10-06 HISTORY — PX: NASAL SEPTOPLASTY W/ TURBINOPLASTY: SHX2070

## 2011-10-06 SURGERY — SEPTOPLASTY, NOSE, WITH NASAL TURBINATE REDUCTION
Anesthesia: General | Laterality: Bilateral | Wound class: Clean Contaminated

## 2011-10-06 MED ORDER — ALBUTEROL SULFATE HFA 108 (90 BASE) MCG/ACT IN AERS
INHALATION_SPRAY | RESPIRATORY_TRACT | Status: DC | PRN
  Administered 2011-10-06: 2 via RESPIRATORY_TRACT

## 2011-10-06 MED ORDER — ACETAMINOPHEN 10 MG/ML IV SOLN
INTRAVENOUS | Status: AC
  Filled 2011-10-06: qty 100

## 2011-10-06 MED ORDER — HYDROCODONE-ACETAMINOPHEN 7.5-500 MG PO TABS: 1.0000 | ORAL_TABLET | Freq: Four times a day (QID) | ORAL | Status: AC | PRN

## 2011-10-06 MED ORDER — MIDAZOLAM HCL 5 MG/5ML IJ SOLN
INTRAMUSCULAR | Status: DC | PRN
  Administered 2011-10-06: 2 mg via INTRAVENOUS

## 2011-10-06 MED ORDER — LIDOCAINE-EPINEPHRINE 1 %-1:100000 IJ SOLN
INTRAMUSCULAR | Status: DC | PRN
  Administered 2011-10-06: 20 mL

## 2011-10-06 MED ORDER — ACETAMINOPHEN 10 MG/ML IV SOLN
INTRAVENOUS | Status: DC | PRN
  Administered 2011-10-06: 1000 mg via INTRAVENOUS

## 2011-10-06 MED ORDER — CEPHALEXIN 500 MG PO CAPS: 500.0000 mg | ORAL_CAPSULE | Freq: Three times a day (TID) | ORAL | Status: AC

## 2011-10-06 MED ORDER — LACTATED RINGERS IV SOLN
INTRAVENOUS | Status: DC
  Administered 2011-10-06: 11:00:00 via INTRAVENOUS

## 2011-10-06 MED ORDER — 0.9 % SODIUM CHLORIDE (POUR BTL) OPTIME
TOPICAL | Status: DC | PRN
  Administered 2011-10-06: 1000 mL

## 2011-10-06 MED ORDER — GLYCOPYRROLATE 0.2 MG/ML IJ SOLN
INTRAMUSCULAR | Status: DC | PRN
  Administered 2011-10-06: .7 mg via INTRAVENOUS

## 2011-10-06 MED ORDER — PROPOFOL 10 MG/ML IV EMUL
INTRAVENOUS | Status: DC | PRN
  Administered 2011-10-06: 200 mg via INTRAVENOUS

## 2011-10-06 MED ORDER — DEXAMETHASONE SODIUM PHOSPHATE 4 MG/ML IJ SOLN
INTRAMUSCULAR | Status: DC | PRN
  Administered 2011-10-06: 4 mg via INTRAVENOUS

## 2011-10-06 MED ORDER — ROCURONIUM BROMIDE 100 MG/10ML IV SOLN
INTRAVENOUS | Status: DC | PRN
  Administered 2011-10-06: 40 mg via INTRAVENOUS

## 2011-10-06 MED ORDER — NEOSTIGMINE METHYLSULFATE 1 MG/ML IJ SOLN
INTRAMUSCULAR | Status: DC | PRN
  Administered 2011-10-06: 4 mg via INTRAVENOUS

## 2011-10-06 MED ORDER — METOPROLOL SUCCINATE ER 25 MG PO TB24
25.0000 mg | ORAL_TABLET | ORAL | Status: AC
  Administered 2011-10-06: 25 mg via ORAL
  Filled 2011-10-06: qty 1

## 2011-10-06 MED ORDER — OXYMETAZOLINE HCL 0.05 % NA SOLN
NASAL | Status: DC | PRN
  Administered 2011-10-06: 1

## 2011-10-06 MED ORDER — PROMETHAZINE HCL 25 MG RE SUPP: 25.0000 mg | Freq: Four times a day (QID) | RECTAL | Status: DC | PRN

## 2011-10-06 MED ORDER — LACTATED RINGERS IV SOLN
INTRAVENOUS | Status: DC | PRN
  Administered 2011-10-06: 11:00:00 via INTRAVENOUS

## 2011-10-06 MED ORDER — HYDROMORPHONE HCL PF 1 MG/ML IJ SOLN
0.2500 mg | INTRAMUSCULAR | Status: DC | PRN
  Administered 2011-10-06: 0.5 mg via INTRAVENOUS

## 2011-10-06 MED ORDER — FENTANYL CITRATE 0.05 MG/ML IJ SOLN
INTRAMUSCULAR | Status: DC | PRN
  Administered 2011-10-06: 50 ug via INTRAVENOUS
  Administered 2011-10-06 (×2): 100 ug via INTRAVENOUS

## 2011-10-06 SURGICAL SUPPLY — 25 items
ATTRACTOMAT 16X20 MAGNETIC DRP (DRAPES) IMPLANT
CANISTER SUCTION 2500CC (MISCELLANEOUS) ×2 IMPLANT
CLOTH BEACON ORANGE TIMEOUT ST (SAFETY) ×2 IMPLANT
COAGULATOR SUCT SWTCH 10FR 6 (ELECTROSURGICAL) IMPLANT
CRADLE DONUT ADULT HEAD (MISCELLANEOUS) ×2 IMPLANT
DRESSING TELFA 8X3 (GAUZE/BANDAGES/DRESSINGS) IMPLANT
GAUZE SPONGE 2X2 8PLY STRL LF (GAUZE/BANDAGES/DRESSINGS) ×1 IMPLANT
GLOVE ECLIPSE 7.5 STRL STRAW (GLOVE) ×2 IMPLANT
GLOVE SURG SS PI 6.5 STRL IVOR (GLOVE) ×2 IMPLANT
GOWN STRL NON-REIN LRG LVL3 (GOWN DISPOSABLE) ×4 IMPLANT
KIT BASIN OR (CUSTOM PROCEDURE TRAY) ×2 IMPLANT
KIT ROOM TURNOVER OR (KITS) ×2 IMPLANT
NEEDLE 27GAX1X1/2 (NEEDLE) ×2 IMPLANT
NS IRRIG 1000ML POUR BTL (IV SOLUTION) ×2 IMPLANT
PAD ARMBOARD 7.5X6 YLW CONV (MISCELLANEOUS) ×2 IMPLANT
PATTIES SURGICAL .5 X3 (DISPOSABLE) ×2 IMPLANT
SPONGE GAUZE 2X2 STER 10/PKG (GAUZE/BANDAGES/DRESSINGS) ×1
SUT CHROMIC 4 0 P 3 18 (SUTURE) ×2 IMPLANT
SUT ETHILON 3 0 PS 1 (SUTURE) IMPLANT
SUT PLAIN 4 0 ~~LOC~~ 1 (SUTURE) ×2 IMPLANT
TAPE CLOTH .5X10 WHT NS (GAUZE/BANDAGES/DRESSINGS) ×2 IMPLANT
TOWEL OR 17X24 6PK STRL BLUE (TOWEL DISPOSABLE) IMPLANT
TOWEL OR 17X26 10 PK STRL BLUE (TOWEL DISPOSABLE) ×2 IMPLANT
TRAY ENT MC OR (CUSTOM PROCEDURE TRAY) ×2 IMPLANT
WATER STERILE IRR 1000ML POUR (IV SOLUTION) IMPLANT

## 2011-10-06 NOTE — Anesthesia Postprocedure Evaluation (Signed)
  Anesthesia Post-op Note  Patient: [name]  Procedure(s) Performed: Procedure(s) (LRB): NASAL SEPTOPLASTY WITH TURBINATE REDUCTION (Bilateral)  Patient Location: PACU  Anesthesia Type: General  Level of Consciousness: awake and alert   Airway and Oxygen Therapy: Patient Spontanous Breathing  Post-op Pain: mild  Post-op Assessment: Post-op Vital signs reviewed, Patient's Cardiovascular Status Stable, Respiratory Function Stable, Patent Airway and No signs of Nausea or vomiting  Post-op Vital Signs: Reviewed and stable  Complications: No apparent anesthesia complications

## 2011-10-06 NOTE — Interval H&P Note (Signed)
History and Physical Interval Note:  10/06/2011 11:40 AM  [name]  has presented today for surgery, with the diagnosis of DEVIATED NASAL SEPTUM AND TURBINATE HYPERTROPHY  The various methods of treatment have been discussed with the patient and family. After consideration of risks, benefits and other options for treatment, the patient has consented to  Procedure(s) (LRB): NASAL SEPTOPLASTY WITH TURBINATE REDUCTION (Bilateral) as a surgical intervention .  The patients' history has been reviewed, patient examined, no change in status, stable for surgery.  I have reviewed the patients' chart and labs.  Questions were answered to the patient's satisfaction.     Frady Taddeo

## 2011-10-06 NOTE — Preoperative (Signed)
Beta Blockers   Reason not to administer Beta Blockers:Not Applicable, took Metoprolol at 1000

## 2011-10-06 NOTE — Op Note (Signed)
OPERATIVE REPORT  DATE OF SURGERY: 10/06/2011  PATIENT:  [name],  56 y.o. male  PRE-OPERATIVE DIAGNOSIS:  DEVIATED NASAL SEPTUM AND TURBINATE HYPERTROPHY  POST-OPERATIVE DIAGNOSIS:  DEVIATED NASAL SEPTUM AND TURBINATE HYPERTROPHY  PROCEDURE:  Procedure(s): NASAL SEPTOPLASTY WITH TURBINATE REDUCTION  SURGEON:  Susy Frizzle, MD  ASSISTANTS: none   ANESTHESIA:   general  EBL:  15 ml  DRAINS: none   LOCAL MEDICATIONS USED:  LIDOCAINE   SPECIMEN:  Right nasal polyp  COUNTS:  YES  PROCEDURE DETAILS: The patient was taken to the operating room and placed on the operating table in the supine position. Following induction of general endotracheal anesthesia, the nose was prepped and draped in a standard fashion. Afrin spray was used preoperatively. Xylocaine with epinephrine was infiltrated into the septum, the columella, and the inferior turbinates bilaterally. Less than 5 cc was used. Afrin pledgets were then placed bilaterally.  #1 nasal septoplasty. A left hemitransfixion incision was created with a 15 scalpel and used to expose the septal cartilage on the left side. Mucoperichondrial flap was developed posteriorly down to left side to the sphenoid rostrum. There were several because of tears created because of the severity of the deviation. The bony cartilaginous junction was divided and a similar flap was developed down the right side posteriorly and anteriorly. Again, there were several mucosal tears due to the severity of the deviation. The majority of the ethmoid plate and vomer and bone were resected. The posterior aspect of the quadrangular cartilage was also resected. The residual quadrangular cartilage was dissected out of the anterior pocket mucosa and was scored in 2 directions anteriorly on the left side to promote some release of the concavity. The mucosal incision was then reapproximated with 4-0 chromic suture. A 4-0 plain gut was used to quilt the septal flaps into  reapproximate the edges of the tears bilaterally.  #2 submucous resection inferior turbinates bilaterally. The leading edge of the inferior turbinates was incised in a vertical fashion bilaterally. The right inferior turbinate was severely enlarged. The left side was mildly to moderately enlarged. Cottle elevator was used to elevate mucosa off bone in all directions and a large bony fragments were resected. Turbinate remnants were outfractured with a Cottle elevator. On the right side, there was extensive polypoid disease arising from the inferior aspect of the right inferior turbinate. Most of this was removed with Lenoria Chime forceps and sent for pathologic evaluation. The nasal cavities were suctioned of blood and secretions and were packed with rolled Telfa coated with bacitracin ointment. The pharynx was then suctioned of blood and secretions and the patient was awakened, extubated and transferred to recovery in stable condition.   PATIENT DISPOSITION:  PACU - hemodynamically stable.

## 2011-10-06 NOTE — Anesthesia Preprocedure Evaluation (Addendum)
Anesthesia Evaluation  Patient identified by MRN, date of birth, ID band Patient awake    Reviewed: Allergy & Precautions, H&P , NPO status , Patient's Chart, lab work & pertinent test results, reviewed documented beta blocker date and time   Airway Mallampati: II TM Distance: >3 FB Neck ROM: Full    Dental No notable dental hx. (+) Teeth Intact and Dental Advisory Given   Pulmonary sleep apnea , COPDCurrent Smoker,  breath sounds clear to auscultation  Pulmonary exam normal       Cardiovascular hypertension, On Medications + Past MI Rhythm:Regular Rate:Normal     Neuro/Psych PSYCHIATRIC DISORDERS negative neurological ROS     GI/Hepatic Neg liver ROS, GERD-  Medicated and Controlled,  Endo/Other  negative endocrine ROS  Renal/GU negative Renal ROS  negative genitourinary   Musculoskeletal   Abdominal   Peds  Hematology negative hematology ROS (+)   Anesthesia Other Findings   Reproductive/Obstetrics negative OB ROS                         Anesthesia Physical Anesthesia Plan  ASA: III  Anesthesia Plan: General   Post-op Pain Management:    Induction: Intravenous  Airway Management Planned: Oral ETT  Additional Equipment:   Intra-op Plan:   Post-operative Plan: Extubation in OR  Informed Consent: I have reviewed the patients History and Physical, chart, labs and discussed the procedure including the risks, benefits and alternatives for the proposed anesthesia with the patient or authorized representative who has indicated his/her understanding and acceptance.   Dental advisory given  Plan Discussed with: CRNA  Anesthesia Plan Comments:         Anesthesia Quick Evaluation

## 2011-10-06 NOTE — Transfer of Care (Signed)
Immediate Anesthesia Transfer of Care Note  Patient: [name]  Procedure(s) Performed: Procedure(s) (LRB): NASAL SEPTOPLASTY WITH TURBINATE REDUCTION (Bilateral)  Patient Location: PACU  Anesthesia Type: General  Level of Consciousness: awake, oriented and patient cooperative  Airway & Oxygen Therapy: Patient Spontanous Breathing and Patient connected to face mask oxygen  Post-op Assessment: Report given to PACU RN, Post -op Vital signs reviewed and stable and Patient moving all extremities X 4  Post vital signs: Reviewed and stable  Complications: No apparent anesthesia complications

## 2011-10-06 NOTE — Discharge Instructions (Signed)
Nasal Septal Reconstruction Nasal septal reconstruction or nasal septoplasty is a procedure to straighten the nasal septum. The nasal septum is a wall that separates the two nostrils and nasal passages. It is slightly off center in most people. If the septum is severely deviated, it may result in problems, such as difficulty in breathing through the nose. The bend in the septum may be present at birth or could be due to an injury. This procedure is done if you have any of the following problems.  Deviation of the nasal septum.   Repeated infection of the sinuses (air-filled cavities in the skull).   Pain due to the deviated septum.   Loss of smell due to the deviated septum.   A blood clot in the septum that interferes your breathing.   Frequent nosebleeds.  If the outside of the nose is bent, it may have to be reconstructed by a surgery called rhinoplasty. Sometimes, this procedure may be combined with septoplasty. LET YOUR CAREGIVER KNOW ABOUT:   Allergies.   Previous problems with anesthetics.   History of bleeding or blood problems.   Any medicines that you are currently taking.  RISKS AND COMPLICATIONS  You may have a hole in the septum.   You may have a collection of blood in the septum.   You may develop loss of sensation in the upper lip or teeth.   You may develop an infection.   You may have bleeding.   The front portion of your nose may become flatter than what it was before the procedure.   You may develop a reaction to the anesthetic used.   You may have a recurrence of the nasal obstruction.  BEFORE THE PROCEDURE   Follow the instructions given by your caregiver.   Your caregiver may recommend x-ray and blood tests.   Your caregiver may advise you to stop smoking for at least 2 weeks before the procedure.   Your caregiver may advise you to stop taking aspirin and anti-inflammatory medications such as ibuprofen, 10 days before surgery, as these medicines  can cause bleeding.  If the surgery is going to be under general anesthesia:  You may be advised to eat only a light meal, such as soup or salad the previous night.   You will be advised to avoid eating or drinking anything after midnight and also in the morning of the procedure.  PROCEDURE  If the procedure is being done under general anesthesia, you may be put to sleep. You will not feel the pain. You will not be aware of the procedure. It can also be done under local anesthesia with sedation where the area of the surgery is numbed. The surgeon then makes a cut on the inner lining of the septum. If there is a blood clot, it is drained. The bone and cartilage of the septum are reshaped. The straightened septum is held in place using plastic sheets or splints. Your nose is then packed with gauze to control the bleeding. The procedure may take one to one and a half hours. It generally does not cause bruising or black eyes. AFTER THE PROCEDURE   You may be kept in the recovery room till the effect of the anesthesia wears off.   You may be then brought to your room in the hospital.   You may be asked to breathe through your mouth.   Your nose packing may need to stay in place for 3 to 4 days.   You may  be given medicines for discomfort and nausea.   You may be given antibiotics.   You may be allowed to go home on the same day or have to stay in the hospital for a few days.  HOME CARE INSTRUCTIONS   Do not blow your nose.   Avoid doing heavy work and strenuous exercise for at least one week after the procedure.   Avoid pushing or moving your nose before it heals.   Avoid lifting weight and bending forwards.   Avoid using products that contain aspirin.   Keep your head raised while lying down.   Take the medicines as instructed by your caregiver.   Inform your caregiver if you have any problems after taking your medicine.  SEEK MEDICAL CARE IF:   You have a new symptom.   You  have doubts regarding the procedure or its outcome.  SEEK IMMEDIATE MEDICAL CARE IF:   You develop fever over 102 F (38.9 C).   You have severe difficulty in breathing.   Your nose continues to bleed even after you keep your head raised and apply ice to your forehead and nose for 10 to 15 minutes.  Document Released: 09/07/2007 Document Revised: 05/20/2011 Document Reviewed: 09/07/2007 [facility] Patient Information 2012 [place], [place].  Instructions Following General Anesthetic, Adult A nurse specialized in giving anesthesia (anesthetist) or a doctor specialized in giving anesthesia (anesthesiologist) gave you a medicine that made you sleep while a procedure was performed. For as long as 24 hours following this procedure, you may feel:  Dizzy.   Weak.   Drowsy.  AFTER THE PROCEDURE After surgery, you will be taken to the recovery area where a nurse will monitor your progress. You will be allowed to go home when you are awake, stable, taking fluids well, and without complications. For the first 24 hours following an anesthetic:  Have a responsible person with you.   Do not drive a car. If you are alone, do not take public transportation.   Do not drink alcohol.   Do not take medicine that has not been prescribed by your caregiver.   Do not sign important papers or make important decisions.   You may resume normal diet and activities as directed.   Change bandages (dressings) as directed.   Only take over-the-counter or prescription medicines for pain, discomfort, or fever as directed by your caregiver.  If you have questions or problems that seem related to the anesthetic, call the hospital and ask for the anesthetist or anesthesiologist on call. SEEK IMMEDIATE MEDICAL CARE IF:   You develop a rash.   You have difficulty breathing.   You have chest pain.   You develop any allergic problems.  Document Released: 09/06/2000 Document Revised: 05/20/2011 Document Reviewed:  04/17/2007 [facility] Patient Information 2012 [name], [place].

## 2011-10-07 ENCOUNTER — Encounter (HOSPITAL_COMMUNITY): Payer: Self-pay | Admitting: Otolaryngology

## 2011-10-11 ENCOUNTER — Encounter (HOSPITAL_COMMUNITY): Payer: BC Managed Care – PPO | Attending: Oncology | Admitting: Oncology

## 2011-10-11 ENCOUNTER — Other Ambulatory Visit (HOSPITAL_COMMUNITY): Payer: Self-pay | Admitting: Oncology

## 2011-10-11 VITALS — BP 122/84 | HR 68 | Temp 97.8°F | Wt 175.9 lb

## 2011-10-11 DIAGNOSIS — D72829 Elevated white blood cell count, unspecified: Secondary | ICD-10-CM

## 2011-10-11 DIAGNOSIS — R5381 Other malaise: Secondary | ICD-10-CM

## 2011-10-11 DIAGNOSIS — R0602 Shortness of breath: Secondary | ICD-10-CM | POA: Insufficient documentation

## 2011-10-11 DIAGNOSIS — J449 Chronic obstructive pulmonary disease, unspecified: Secondary | ICD-10-CM

## 2011-10-11 DIAGNOSIS — I252 Old myocardial infarction: Secondary | ICD-10-CM | POA: Insufficient documentation

## 2011-10-11 DIAGNOSIS — J438 Other emphysema: Secondary | ICD-10-CM | POA: Insufficient documentation

## 2011-10-11 DIAGNOSIS — I10 Essential (primary) hypertension: Secondary | ICD-10-CM | POA: Insufficient documentation

## 2011-10-11 DIAGNOSIS — F17200 Nicotine dependence, unspecified, uncomplicated: Secondary | ICD-10-CM | POA: Insufficient documentation

## 2011-10-11 DIAGNOSIS — K219 Gastro-esophageal reflux disease without esophagitis: Secondary | ICD-10-CM | POA: Insufficient documentation

## 2011-10-11 DIAGNOSIS — F411 Generalized anxiety disorder: Secondary | ICD-10-CM | POA: Insufficient documentation

## 2011-10-11 DIAGNOSIS — I2510 Atherosclerotic heart disease of native coronary artery without angina pectoris: Secondary | ICD-10-CM | POA: Insufficient documentation

## 2011-10-11 DIAGNOSIS — Z860100 Personal history of colon polyps, unspecified: Secondary | ICD-10-CM | POA: Insufficient documentation

## 2011-10-11 DIAGNOSIS — G2581 Restless legs syndrome: Secondary | ICD-10-CM | POA: Insufficient documentation

## 2011-10-11 DIAGNOSIS — J4489 Other specified chronic obstructive pulmonary disease: Secondary | ICD-10-CM

## 2011-10-11 DIAGNOSIS — E785 Hyperlipidemia, unspecified: Secondary | ICD-10-CM | POA: Insufficient documentation

## 2011-10-11 DIAGNOSIS — R911 Solitary pulmonary nodule: Secondary | ICD-10-CM | POA: Insufficient documentation

## 2011-10-11 LAB — LACTATE DEHYDROGENASE: LDH: 195 U/L (ref 94–250)

## 2011-10-11 NOTE — Patient Instructions (Signed)
[name]  [id number] 01-Nov-1955 Dr. Glenford Peers  [facility]  Discharge Instructions  RECOMMENDATIONS MADE BY THE CONSULTANT AND ANY TEST RESULTS WILL BE SENT TO YOUR REFERRING DOCTOR.   EXAM FINDINGS BY MD TODAY AND SIGNS AND SYMPTOMS TO REPORT TO CLINIC OR PRIMARY MD: exam and discussion per PA.  Labs are stable.  MEDICATIONS PRESCRIBED: none    INSTRUCTIONS GIVEN AND DISCUSSED: Other :  Report fevers, chills, night sweats, recurring infections, etc.  SPECIAL INSTRUCTIONS/FOLLOW-UP: Lab work Needed in October, Xray Studies Needed in November and Return to Clinic after scans in November.   I acknowledge that I have been informed and understand all the instructions given to me and received a copy. I do not have any more questions at this time, but understand that I may call the Specialty Clinic at [facility] at [phone] during business hours should I have any further questions or need assistance in obtaining follow-up care.    __________________________________________  _____________  __________ Signature of Patient or Authorized Representative            Date                   Time    __________________________________________ Nurse's Signature

## 2011-10-11 NOTE — Progress Notes (Signed)
[name], MD, MD [address] [place] [address]  1. Leukocytosis  CBC, Differential, Comprehensive metabolic panel    CURRENT THERAPY:Observation  INTERVAL HISTORY: [name] 56 y.o. male returns for  regular  visit for followup of  leukocytosis and pulmonary nodule.   I personally reviewed and went over laboratory results with the patient. His CBC was within normal limits in April.  We discussed his lab work and spoke about previous lab work.  His CMET is unremarkable.   I personally reviewed and went over radiographic studies with the patient.  His lung nodule is stable compared to previous exam.  It was recommended to follow-up with subsequent CT in 9 months.  This order was placed and scheduled.   The patient denies any issues.  He recently underwent polypectomy by Dr. Raynald Blend.  I informed the patient that there was no sign of malignancy.  The patient admits he continues to smoke 3 cigarettes per week.  He reports that when he is surrounded by others smoking, he gets the urge to smoke.   The patient reports that he awakes with shortness of breath.  He admits that he is diagnosed with sleep apnea, but he does not utilize his CPAP machine.  He explains that it is too bothersome for him and he cannot sleep.  He utilizes 3 pillows under his head to keep his head elevated.  He sometimes sleeps in a recliner. He admits to fatigue as a result of his sleep apnea.   He denies any complaints to me.  On further discussion with the nurse, he admits to night sweats.  Will add a LDH and ESR today.  He does not have any weight loss.  He denies any fevers, or chills. He denies any weight loss.  As a matter of fact he has gained weight.   Past Medical History  Diagnosis Date  . ASCVD (arteriosclerotic cardiovascular disease)     inferior MI with circumflex PTCA in 1993;cath in 2000-50% OM 1&2,30% LAD ;2008-25% LAD, 80% nondominant right ,60% small OM 2,EF of 55% with severe basilar  inferior hypokinesis; stress nuclear study in 04/2008 moderate inferior scar with peri-infarction ischemic  . Syncope   . Hyperlipidemia   . Hypertension   . Tobacco abuse     50 pack years continuing at one halp pack daily  . COPD (chronic obstructive pulmonary disease)     mild ;excercise induced hypoxemia by cp stress test ;asthma ,bronchitis,  . Restless leg syndrome   . Anxiety and depression   . GERD (gastroesophageal reflux disease)   . Inflammatory polyps of colon with rectal bleeding   . Carcinoma in situ of colon 2004    rectal polyp  . H. pylori infection 2004    treated  . Sleep apnea     does not use CPAP  . Gastritis 12/30/10    EGD Dr Jena Gauss  . Colitis, ischemic 2011  . Tubular adenoma of colon 06/2009    Colonosocpy Dr Jena Gauss  . Leukocytosis     Dr Shon Baton  . Depression   . Anxiety   . Lung nodule 07/16/2011  . Myocardial infarction   . Emphysema of lung     has Hyperlipidemia; ANXIETY; TOBACCO ABUSE; DEPRESSION; OBSTRUCTIVE SLEEP APNEA; HYPERTENSION; ATHEROSCLEROTIC CARDIOVASCULAR DISEASE; COPD; Anorexia; Leukocytosis; RLS (restless legs syndrome); and Lung nodule on his problem list.      has no known allergies.  [name] does not currently have medications on file.  Past  Surgical History  Procedure Date  . Colonoscopy w/ polypectomy 2004    rectal polyp with carcinoma in situ removed via colonoscopy  . Hand surgery     surgical intervention for injury of the fingers of the left hand many years ago  . Cholecystectomy 2004  . Colonoscopy 06/2009    normal terminal ileum, segmental mild inflammation of sigmoid colon (bx unremarkable), polyp, tubular adenoma  . Esophagogastroduodenoscopy 02/2009    query Barrett's but bx negative  . Esophagogastroduodenoscopy 12/30/2010    [name],gastritis, dilated 53F, sm HH, 1 small ulcer, Duodenal erosions, benign bx  . Flexible sigmoidoscopy 12/30/2010     [name],; internal hemorrhoids, anal  papilla  . Heart stent   . Cardiac catheterization   . Nasal septoplasty w/ turbinoplasty 10/06/2011    Procedure: NASAL SEPTOPLASTY WITH TURBINATE REDUCTION;  Surgeon: Serena Colonel, MD;  Location: [facility] OR;  Service: ENT;  Laterality: Bilateral;    Denies any headaches, dizziness, double vision, fevers, chills, night sweats, nausea, vomiting, diarrhea, constipation, chest pain, heart palpitations, shortness of breath, blood in stool, black tarry stool, urinary pain, urinary burning, urinary frequency, hematuria.   PHYSICAL EXAMINATION  ECOG PERFORMANCE STATUS: 1 - Symptomatic but completely ambulatory  Filed Vitals:   10/11/11 1145  BP: 122/84  Pulse: 68  Temp: 97.8 F (36.6 C)    GENERAL:alert, no distress, well nourished, well developed, comfortable, cooperative and smiling SKIN: skin color, texture, turgor are normal, no rashes or significant lesions, thickened HEAD: Normocephalic, No masses, lesions, tenderness or abnormalities EYES: normal, EOMI, Conjunctiva are pink and non-injected EARS: External ears normal OROPHARYNX:lips, buccal mucosa, and tongue normal and mucous membranes are moist  NECK: supple, no adenopathy, thyroid normal size, non-tender, without nodularity, no stridor, non-tender, trachea midline LYMPH:  no palpable lymphadenopathy BREAST:not examined LUNGS: clear to auscultation and percussion, decreased breath sounds HEART: regular rate & rhythm, no murmurs, no gallops, S1 normal and S2 normal ABDOMEN:abdomen soft, non-tender and normal bowel sounds BACK: Back symmetric, no curvature. EXTREMITIES:less then 2 second capillary refill, no joint deformities, effusion, or inflammation, no edema, no cyanosis  NEURO: alert & oriented x 3 with fluent speech, no focal motor/sensory deficits, gait normal   LABORATORY DATA: CBC    Component Value Date/Time   WBC 7.0 09/30/2011 1119   RBC 5.08 09/30/2011 1119   HGB 16.1 09/30/2011 1119   HCT 43.9 09/30/2011 1119   PLT  167 09/30/2011 1119   MCV 86.4 09/30/2011 1119   MCH 31.7 09/30/2011 1119   MCHC 36.7* 09/30/2011 1119   RDW 13.2 09/30/2011 1119   LYMPHSABS 2.1 07/01/2011 1122   MONOABS 0.3 07/01/2011 1122   EOSABS 0.1 07/01/2011 1122   BASOSABS 0.0 07/01/2011 1122      Chemistry      Component Value Date/Time   NA 140 09/30/2011 1119   K 4.3 09/30/2011 1119   CL 106 09/30/2011 1119   CO2 27 09/30/2011 1119   BUN 6 09/30/2011 1119   CREATININE 0.83 09/30/2011 1119   CREATININE 0.83 08/31/2011 1423      Component Value Date/Time   CALCIUM 9.4 09/30/2011 1119   ALKPHOS 98 09/30/2011 1119   AST 27 09/30/2011 1119   ALT 40 09/30/2011 1119   BILITOT 0.5 09/30/2011 1119       RADIOGRAPHIC STUDIES:  07/16/2011  *RADIOLOGY REPORT*  Clinical Data: Evaluate lung nodule  CT CHEST WITHOUT CONTRAST  Technique: Multidetector CT imaging of the chest was performed  following the  standard protocol without IV contrast.  Comparison: 03/17/2011  Findings:  No enlarged axillary or supraclavicular lymph nodes.  No enlarged mediastinal or hilar lymph nodes identified.  No pericardial or pleural effusions identified.  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  Dependent type changes are noted within both posterior lung bases.  Pulmonary nodule within the left lower lobe measures 0.7 cm, image  21. Stable from previous exam.  There are no new or enlarging pulmonary nodules or masses  identified.  Review of the visualized osseous structures is unremarkable.  Limited imaging through the upper abdomen shows cholecystectomy  clips.  IMPRESSION:  1. Pulmonary nodule within the left lower lobe is stable compared  with 03/17/2011. This will need continued surveillance to document  stability. The next follow-up examination should be obtained in 9-  12 months. This recommendation follows the consensus statement:  Guidelines for Management of Small Pulmonary Nodules Detected on CT  Scans: A Statement from the Fleischner Society  as published in  Radiology 2005; 237:395-400. Available online at:  [email or web address].cz.  2. Emphysema.  Original Report Authenticated By: Rosealee Albee, M.D.    PATHOLOGY: 10/06/2011  Diagnosis Nasal polyp, Right - FRAGMENTS OF SINONASAL-TYPE INFLAMMATORY POLYP. - NO EVIDENCE OF MALIGNANCY. Zandra Abts MD Pathologist, Electronic Signature (Case signed 10/07/2011)   ASSESSMENT:  1. Erythrocytosis, secondary to lung disease, within normal limits most recently  2. Leukocytosis, secondary to IBS and/or lung disease, within normal limits most recently  3. COPD  4. IBS  5. Diarrhea, followed by GI  6. Dysphagia, followed by GI  7. Tobacco abuse 30+ pack years, currently smoking up to 3 cigarettes weekly. Began smoking at the age of 43  8. Shortness of breath  9. Fatigue  10. Night sweats  11. Decreased appetite, without weight loss   PLAN:  1. I personally reviewed and went over radiographic studies with the patient. 2. I personally reviewed and went over laboratory results with the patient. 3. CT scan of chest without contrast in November 2013 for surveillance of lung nodule. 4. Lab work before CT scan: CBC diff, CMET 5. Encouraged patient to utilize CPAP for sleep apnea. 6. Encouraged patient to follow-up with PCP. 7. Return following CT scan for follow-up.  All questions were answered. The patient knows to call the clinic with any problems, questions or concerns. We can certainly see the patient much sooner if necessary.  Richel Millspaugh

## 2011-10-11 NOTE — Progress Notes (Signed)
[name] presented for labwork. Labs per MD order drawn via Peripheral Line 23 gauge needle inserted in left AC  Good blood return present. Procedure without incident.  Needle removed intact. Patient tolerated procedure well.

## 2011-10-22 ENCOUNTER — Ambulatory Visit (INDEPENDENT_AMBULATORY_CARE_PROVIDER_SITE_OTHER): Payer: BC Managed Care – PPO | Admitting: Psychiatry

## 2011-10-22 ENCOUNTER — Encounter (HOSPITAL_COMMUNITY): Payer: Self-pay | Admitting: Psychiatry

## 2011-10-22 DIAGNOSIS — F419 Anxiety disorder, unspecified: Secondary | ICD-10-CM

## 2011-10-22 DIAGNOSIS — F411 Generalized anxiety disorder: Secondary | ICD-10-CM

## 2011-10-22 MED ORDER — ALPRAZOLAM 1 MG PO TABS: 1.0000 mg | ORAL_TABLET | Freq: Four times a day (QID) | ORAL | Status: DC | PRN

## 2011-10-22 NOTE — Progress Notes (Signed)
Patient ID: [name], male   DOB: 1956-01-14, 56 y.o.   MRN: [id number] Anxiety still intense.  Has a spot on lung on x-ray to follow up in 3 mos.  Sleeping poorly.  Mood is down and somber; withdrawn.

## 2011-11-19 ENCOUNTER — Encounter: Payer: Self-pay | Admitting: Internal Medicine

## 2011-12-23 ENCOUNTER — Other Ambulatory Visit: Payer: Self-pay | Admitting: Cardiology

## 2012-01-14 ENCOUNTER — Ambulatory Visit (HOSPITAL_COMMUNITY): Payer: Self-pay | Admitting: Psychiatry

## 2012-01-26 ENCOUNTER — Encounter (HOSPITAL_COMMUNITY): Payer: Self-pay

## 2012-01-26 ENCOUNTER — Inpatient Hospital Stay (HOSPITAL_COMMUNITY)
Admission: EM | Admit: 2012-01-26 | Discharge: 2012-01-27 | DRG: 852 | Disposition: A | Discharge status: Home / Self Care | Payer: BC Managed Care – PPO | Attending: Internal Medicine | Admitting: Internal Medicine

## 2012-01-26 ENCOUNTER — Emergency Department (HOSPITAL_COMMUNITY): Payer: BC Managed Care – PPO

## 2012-01-26 ENCOUNTER — Encounter (HOSPITAL_COMMUNITY)
Admission: EM | Disposition: A | Discharge status: Home / Self Care | Payer: Self-pay | Source: Home / Self Care | Attending: Internal Medicine

## 2012-01-26 DIAGNOSIS — Z955 Presence of coronary angioplasty implant and graft: Secondary | ICD-10-CM

## 2012-01-26 DIAGNOSIS — J449 Chronic obstructive pulmonary disease, unspecified: Secondary | ICD-10-CM

## 2012-01-26 DIAGNOSIS — Z79899 Other long term (current) drug therapy: Secondary | ICD-10-CM

## 2012-01-26 DIAGNOSIS — I10 Essential (primary) hypertension: Secondary | ICD-10-CM | POA: Diagnosis present

## 2012-01-26 DIAGNOSIS — I2510 Atherosclerotic heart disease of native coronary artery without angina pectoris: Secondary | ICD-10-CM

## 2012-01-26 DIAGNOSIS — G4733 Obstructive sleep apnea (adult) (pediatric): Secondary | ICD-10-CM | POA: Diagnosis present

## 2012-01-26 DIAGNOSIS — F341 Dysthymic disorder: Secondary | ICD-10-CM | POA: Diagnosis present

## 2012-01-26 DIAGNOSIS — Z85038 Personal history of other malignant neoplasm of large intestine: Secondary | ICD-10-CM

## 2012-01-26 DIAGNOSIS — F17200 Nicotine dependence, unspecified, uncomplicated: Secondary | ICD-10-CM

## 2012-01-26 DIAGNOSIS — K219 Gastro-esophageal reflux disease without esophagitis: Secondary | ICD-10-CM | POA: Diagnosis present

## 2012-01-26 DIAGNOSIS — Z860100 Personal history of colon polyps, unspecified: Secondary | ICD-10-CM

## 2012-01-26 DIAGNOSIS — R079 Chest pain, unspecified: Secondary | ICD-10-CM

## 2012-01-26 DIAGNOSIS — I252 Old myocardial infarction: Secondary | ICD-10-CM

## 2012-01-26 DIAGNOSIS — E785 Hyperlipidemia, unspecified: Secondary | ICD-10-CM

## 2012-01-26 DIAGNOSIS — J4489 Other specified chronic obstructive pulmonary disease: Secondary | ICD-10-CM

## 2012-01-26 DIAGNOSIS — I200 Unstable angina: Secondary | ICD-10-CM

## 2012-01-26 DIAGNOSIS — E876 Hypokalemia: Secondary | ICD-10-CM | POA: Diagnosis present

## 2012-01-26 HISTORY — PX: LEFT HEART CATHETERIZATION WITH CORONARY ANGIOGRAM: SHX5451

## 2012-01-26 HISTORY — PX: CORONARY ANGIOPLASTY WITH STENT PLACEMENT: SHX49

## 2012-01-26 HISTORY — PX: PERCUTANEOUS CORONARY STENT INTERVENTION (PCI-S): SHX5485

## 2012-01-26 LAB — COMPREHENSIVE METABOLIC PANEL
ALT: 31 U/L (ref 0–53)
Alkaline Phosphatase: 103 U/L (ref 39–117)
BUN: 11 mg/dL (ref 6–23)
CO2: 25 mEq/L (ref 19–32)
GFR calc Af Amer: >90 mL/min (ref >90)
GFR calc non Af Amer: >90 mL/min (ref >90)
Glucose, Bld: 130 mg/dL — ABNORMAL HIGH (ref 70–99)
Potassium: 3.1 mEq/L — ABNORMAL LOW (ref 3.5–5.1)
Total Bilirubin: 0.4 mg/dL (ref 0.3–1.2)
Total Protein: 6.3 g/dL (ref 6.0–8.3)

## 2012-01-26 LAB — CBC WITH DIFFERENTIAL/PLATELET
Eosinophils Absolute: 0.1 10*3/uL (ref 0.0–0.7)
Hemoglobin: 16 g/dL (ref 13.0–17.0)
Lymphocytes Relative: 33 % (ref 12–46)
Lymphs Abs: 2.1 10*3/uL (ref 0.7–4.0)
MCH: 31.8 pg (ref 26.0–34.0)
MCV: 87.3 fL (ref 78.0–100.0)
Monocytes Relative: 5 % (ref 3–12)
Neutrophils Relative %: 61 % (ref 43–77)
RBC: 5.03 MIL/uL (ref 4.22–5.81)
WBC: 6.5 10*3/uL (ref 4.0–10.5)

## 2012-01-26 LAB — CARDIAC PANEL(CRET KIN+CKTOT+MB+TROPI)
CK, MB: 2.5 ng/mL (ref 0.3–4.0)
Total CK: 100 U/L (ref 7–232)
Troponin I: <0.3 ng/mL (ref <0.30)

## 2012-01-26 LAB — TROPONIN I: Troponin I: <0.3 ng/mL (ref <0.30)

## 2012-01-26 SURGERY — LEFT HEART CATHETERIZATION WITH CORONARY ANGIOGRAM
Anesthesia: LOCAL

## 2012-01-26 MED ORDER — POTASSIUM CHLORIDE CRYS ER 20 MEQ PO TBCR
20.0000 meq | EXTENDED_RELEASE_TABLET | Freq: Once | ORAL | Status: AC
  Administered 2012-01-26: 20 meq via ORAL
  Filled 2012-01-26: qty 1

## 2012-01-26 MED ORDER — METOPROLOL SUCCINATE ER 25 MG PO TB24
25.0000 mg | ORAL_TABLET | Freq: Every day | ORAL | Status: DC
  Filled 2012-01-26 (×2): qty 1

## 2012-01-26 MED ORDER — ONDANSETRON HCL 4 MG/2ML IJ SOLN: 4.0000 mg | Freq: Four times a day (QID) | INTRAMUSCULAR | Status: DC | PRN

## 2012-01-26 MED ORDER — ASPIRIN EC 81 MG PO TBEC: 81.0000 mg | DELAYED_RELEASE_TABLET | Freq: Every day | ORAL | Status: DC

## 2012-01-26 MED ORDER — GABAPENTIN 300 MG PO CAPS: 300.0000 mg | ORAL_CAPSULE | Freq: Two times a day (BID) | ORAL | Status: DC

## 2012-01-26 MED ORDER — PRAVASTATIN SODIUM 40 MG PO TABS
40.0000 mg | ORAL_TABLET | Freq: Every day | ORAL | Status: DC
  Administered 2012-01-26 – 2012-01-27 (×2): 40 mg via ORAL
  Filled 2012-01-26 (×2): qty 1

## 2012-01-26 MED ORDER — GABAPENTIN 600 MG PO TABS
600.0000 mg | ORAL_TABLET | Freq: Every day | ORAL | Status: DC
  Administered 2012-01-26: 600 mg via ORAL
  Filled 2012-01-26 (×2): qty 1

## 2012-01-26 MED ORDER — VERAPAMIL HCL 2.5 MG/ML IV SOLN
INTRAVENOUS | Status: AC
  Filled 2012-01-26: qty 2

## 2012-01-26 MED ORDER — NITROGLYCERIN 0.4 MG SL SUBL
0.4000 mg | SUBLINGUAL_TABLET | SUBLINGUAL | Status: DC | PRN
  Administered 2012-01-26 (×2): 0.4 mg via SUBLINGUAL
  Filled 2012-01-26: qty 25

## 2012-01-26 MED ORDER — SODIUM CHLORIDE 0.9 % IV SOLN: INTRAVENOUS | Status: DC

## 2012-01-26 MED ORDER — SODIUM CHLORIDE 0.9 % IV SOLN: 250.0000 mL | INTRAVENOUS | Status: DC | PRN

## 2012-01-26 MED ORDER — ACETAMINOPHEN 325 MG PO TABS: 650.0000 mg | ORAL_TABLET | ORAL | Status: DC | PRN

## 2012-01-26 MED ORDER — SODIUM CHLORIDE 0.9 % IJ SOLN: 3.0000 mL | INTRAMUSCULAR | Status: DC | PRN

## 2012-01-26 MED ORDER — MIDAZOLAM HCL 2 MG/2ML IJ SOLN
INTRAMUSCULAR | Status: AC
  Filled 2012-01-26: qty 2

## 2012-01-26 MED ORDER — NITROGLYCERIN 2 % TD OINT
1.0000 [in_us] | TOPICAL_OINTMENT | Freq: Once | TRANSDERMAL | Status: AC
  Administered 2012-01-26: 1 [in_us] via TOPICAL
  Filled 2012-01-26: qty 1

## 2012-01-26 MED ORDER — VERAPAMIL HCL ER 180 MG PO TBCR
180.0000 mg | EXTENDED_RELEASE_TABLET | Freq: Every day | ORAL | Status: DC
  Administered 2012-01-27: 180 mg via ORAL
  Filled 2012-01-26 (×2): qty 1

## 2012-01-26 MED ORDER — NITROGLYCERIN 0.2 MG/ML ON CALL CATH LAB
INTRAVENOUS | Status: AC
  Filled 2012-01-26: qty 1

## 2012-01-26 MED ORDER — TESTOSTERONE 12.5 MG/ACT (1%) TD GEL: Freq: Four times a day (QID) | TRANSDERMAL | Status: DC

## 2012-01-26 MED ORDER — GABAPENTIN 300 MG PO CAPS
300.0000 mg | ORAL_CAPSULE | Freq: Every day | ORAL | Status: DC
  Filled 2012-01-26: qty 1

## 2012-01-26 MED ORDER — ALPRAZOLAM 0.25 MG PO TABS: 0.2500 mg | ORAL_TABLET | Freq: Two times a day (BID) | ORAL | Status: DC | PRN

## 2012-01-26 MED ORDER — FENTANYL CITRATE 0.05 MG/ML IJ SOLN
INTRAMUSCULAR | Status: AC
  Filled 2012-01-26: qty 2

## 2012-01-26 MED ORDER — HEPARIN (PORCINE) IN NACL 100-0.45 UNIT/ML-% IJ SOLN
1100.0000 [IU]/h | INTRAMUSCULAR | Status: DC
  Administered 2012-01-26: 1100 [IU]/h via INTRAVENOUS
  Filled 2012-01-26: qty 250

## 2012-01-26 MED ORDER — TESTOSTERONE 12.5 MG/ACT (1%) TD GEL: 1.2500 g | Freq: Four times a day (QID) | TRANSDERMAL | Status: DC

## 2012-01-26 MED ORDER — ALPRAZOLAM 0.25 MG PO TABS: 1.0000 mg | ORAL_TABLET | Freq: Four times a day (QID) | ORAL | Status: DC | PRN

## 2012-01-26 MED ORDER — ASPIRIN 81 MG PO CHEW
324.0000 mg | CHEWABLE_TABLET | Freq: Once | ORAL | Status: AC
  Administered 2012-01-26: 324 mg via ORAL
  Filled 2012-01-26: qty 4

## 2012-01-26 MED ORDER — HEPARIN (PORCINE) IN NACL 2-0.9 UNIT/ML-% IJ SOLN
INTRAMUSCULAR | Status: AC
  Filled 2012-01-26: qty 2000

## 2012-01-26 MED ORDER — GABAPENTIN 300 MG PO CAPS
300.0000 mg | ORAL_CAPSULE | Freq: Every day | ORAL | Status: DC
  Administered 2012-01-27: 10:00:00 300 mg via ORAL
  Filled 2012-01-26: qty 1

## 2012-01-26 MED ORDER — NITROGLYCERIN 0.4 MG SL SUBL: 0.4000 mg | SUBLINGUAL_TABLET | SUBLINGUAL | Status: DC | PRN

## 2012-01-26 MED ORDER — SODIUM CHLORIDE 0.9 % IJ SOLN: 3.0000 mL | Freq: Two times a day (BID) | INTRAMUSCULAR | Status: DC

## 2012-01-26 MED ORDER — MORPHINE SULFATE 2 MG/ML IJ SOLN
2.0000 mg | Freq: Once | INTRAMUSCULAR | Status: AC
  Administered 2012-01-26: 2 mg via INTRAVENOUS
  Filled 2012-01-26: qty 1

## 2012-01-26 MED ORDER — BUDESONIDE-FORMOTEROL FUMARATE 80-4.5 MCG/ACT IN AERO
2.0000 | INHALATION_SPRAY | Freq: Two times a day (BID) | RESPIRATORY_TRACT | Status: DC
  Administered 2012-01-26 – 2012-01-27 (×2): 2 via RESPIRATORY_TRACT
  Filled 2012-01-26: qty 6.9

## 2012-01-26 MED ORDER — OLANZAPINE 5 MG PO TABS
5.0000 mg | ORAL_TABLET | Freq: Every day | ORAL | Status: DC
  Administered 2012-01-26: 5 mg via ORAL
  Filled 2012-01-26 (×2): qty 1

## 2012-01-26 MED ORDER — DIAZEPAM 5 MG PO TABS: 5.0000 mg | ORAL_TABLET | ORAL | Status: DC

## 2012-01-26 MED ORDER — HYDROCODONE-ACETAMINOPHEN 7.5-325 MG PO TABS: 1.0000 | ORAL_TABLET | Freq: Four times a day (QID) | ORAL | Status: DC | PRN

## 2012-01-26 MED ORDER — ASPIRIN EC 81 MG PO TBEC
81.0000 mg | DELAYED_RELEASE_TABLET | Freq: Every day | ORAL | Status: DC
  Administered 2012-01-27: 10:00:00 81 mg via ORAL
  Filled 2012-01-26 (×2): qty 1

## 2012-01-26 MED ORDER — ALBUTEROL SULFATE (5 MG/ML) 0.5% IN NEBU
2.5000 mg | INHALATION_SOLUTION | Freq: Four times a day (QID) | RESPIRATORY_TRACT | Status: DC | PRN
  Administered 2012-01-26: 2.5 mg via RESPIRATORY_TRACT
  Filled 2012-01-26: qty 0.5

## 2012-01-26 MED ORDER — POTASSIUM CHLORIDE CRYS ER 20 MEQ PO TBCR: 20.0000 meq | EXTENDED_RELEASE_TABLET | Freq: Once | ORAL | Status: DC

## 2012-01-26 MED ORDER — LIDOCAINE HCL (PF) 1 % IJ SOLN
INTRAMUSCULAR | Status: AC
  Filled 2012-01-26: qty 30

## 2012-01-26 MED ORDER — SIMVASTATIN 20 MG PO TABS: 20.0000 mg | ORAL_TABLET | Freq: Every day | ORAL | Status: DC

## 2012-01-26 MED ORDER — PROMETHAZINE HCL 25 MG RE SUPP: 25.0000 mg | Freq: Four times a day (QID) | RECTAL | Status: DC | PRN

## 2012-01-26 MED ORDER — CLOPIDOGREL BISULFATE 75 MG PO TABS
ORAL_TABLET | ORAL | Status: AC
  Filled 2012-01-26: qty 8

## 2012-01-26 MED ORDER — CLOPIDOGREL BISULFATE 75 MG PO TABS
75.0000 mg | ORAL_TABLET | Freq: Every day | ORAL | Status: DC
  Administered 2012-01-27: 10:00:00 75 mg via ORAL
  Filled 2012-01-26: qty 1

## 2012-01-26 MED ORDER — ZOLPIDEM TARTRATE 5 MG PO TABS: 5.0000 mg | ORAL_TABLET | Freq: Every evening | ORAL | Status: DC | PRN

## 2012-01-26 MED ORDER — NITROGLYCERIN IN D5W 200-5 MCG/ML-% IV SOLN
5.0000 ug/min | INTRAVENOUS | Status: DC
  Administered 2012-01-26: 5 ug/min via INTRAVENOUS
  Administered 2012-01-26: 15 ug/min via INTRAVENOUS
  Administered 2012-01-26: 10 ug/min via INTRAVENOUS
  Filled 2012-01-26: qty 250

## 2012-01-26 MED ORDER — HEPARIN BOLUS VIA INFUSION
4000.0000 [IU] | Freq: Once | INTRAVENOUS | Status: AC
  Administered 2012-01-26: 4000 [IU] via INTRAVENOUS

## 2012-01-26 MED ORDER — SODIUM CHLORIDE 0.9 % IV SOLN: 250.0000 mL | INTRAVENOUS | Status: DC

## 2012-01-26 MED ORDER — SODIUM CHLORIDE 0.9 % IV SOLN
1.0000 mL/kg/h | INTRAVENOUS | Status: AC
  Administered 2012-01-26: 1 mL/kg/h via INTRAVENOUS

## 2012-01-26 MED ORDER — IPRATROPIUM-ALBUTEROL 18-103 MCG/ACT IN AERO
2.0000 | INHALATION_SPRAY | Freq: Four times a day (QID) | RESPIRATORY_TRACT | Status: DC | PRN
  Filled 2012-01-26: qty 14.7

## 2012-01-26 MED ORDER — BIVALIRUDIN 250 MG IV SOLR
INTRAVENOUS | Status: AC
  Filled 2012-01-26: qty 250

## 2012-01-26 NOTE — Consult Note (Signed)
[facility] Medical Consultation  KASH DAVIE ZOX:[id number] DOB: 1956-04-11 DOA: 01/26/2012 PCP: [name], MD   Requesting physician: Estell Harpin Date of consultation: 01/26/12 Reason for consultation: chest pain  Impression/Recommendations 1. Unstable angina: Still having pain despite several nitroglycerin sublingually. He has received aspirin. EKG and troponin is negative, but patient will require a cardiac catheterization. I will start heparin, nitroglycerin drip. Give a dose of morphine. Oxygen. I've discussed the case with cardiology and they will arrange transfer to [facility] if they agree.  \     . ASCVD (arteriosclerotic cardiovascular disease)     inferior MI with circumflex PTCA in 1993;cath in 2000-50% OM 1&2,30% LAD ;2008-25% LAD, 80% nondominant right ,60% small OM 2,EF of 55% with severe basilar inferior hypokinesis; stress nuclear study in 04/2008 moderate inferior scar with peri-infarction ischemic  . Hyperlipidemia   . Hypertension   . Tobacco abuse     50 pack years continuing at one halp pack daily  . COPD (chronic obstructive pulmonary disease)     mild ;excercise induced hypoxemia by cp stress test ;asthma ,bronchitis,  . Restless leg syndrome   . Anxiety and depression   . GERD (gastroesophageal reflux disease)   . Sleep apnea     does not use CPAP  . Depression   . Anxiety   . Lung nodule 07/16/2011   Chief Complaint: Chest pain  HPI:  Patient reports left-sided chest pressure starting this morning. It radiated to his left arm. It was associated with diaphoresis, dizziness, weakness, dyspnea. His wife reports that he looked very pale. Patient took 2 sublingual nitroglycerin this morning but not the rest of his medications. He received aspirin and 2 more nitroglycerin in the emergency room. Troponin and EKG shows no evidence of ischemia. Patient's discomfort at its worst was 10 out of 10. Currently 6/10. He feels short of breath. His arm is no  longer hurting. He has a history of heart disease and felt similarly when he had a previous MI. ED physician spoke with Dr. Dietrich Pates who recommended admission to the [facility] to the hospitalist service. Patient's primary care physician is Dr. Gerda Diss. Patient continues to smoke a half pack of cigarettes a day. He is noncompliant with CPAP. Also, his wife reports that he has had an increase in leg swelling over the past few weeks. Upon further questioning, patient reports 2 day discomfort and fatigue with exertion over the past few weeks.  Review of Systems:  As above otherwise negative.  Past Medical History  Diagnosis Date  . ASCVD (arteriosclerotic cardiovascular disease)     inferior MI with circumflex PTCA in 1993;cath in 2000-50% OM 1&2,30% LAD ;2008-25% LAD, 80% nondominant right ,60% small OM 2,EF of 55% with severe basilar inferior hypokinesis; stress nuclear study in 04/2008 moderate inferior scar with peri-infarction ischemic  . Syncope   . Hyperlipidemia   . Hypertension   . Tobacco abuse     50 pack years continuing at one halp pack daily  . COPD (chronic obstructive pulmonary disease)     mild ;excercise induced hypoxemia by cp stress test ;asthma ,bronchitis,  . Restless leg syndrome   . Anxiety and depression   . GERD (gastroesophageal reflux disease)   . Inflammatory polyps of colon with rectal bleeding   . Carcinoma in situ of colon 2004    rectal polyp  . H. pylori infection 2004    treated  . Sleep apnea     does not use CPAP  .  Gastritis 12/30/10    EGD Dr Jena Gauss  . Colitis, ischemic 2011  . Tubular adenoma of colon 06/2009    Colonosocpy Dr Jena Gauss  . Leukocytosis     Dr Shon Baton  . Depression   . Anxiety   . Lung nodule 07/16/2011   Past Surgical History  Procedure Date  . Colonoscopy w/ polypectomy 2004    rectal polyp with carcinoma in situ removed via colonoscopy  . Hand surgery     surgical intervention for injury of the fingers of the left  hand many years ago  . Cholecystectomy 2004  . Colonoscopy 06/2009    normal terminal ileum, segmental mild inflammation of sigmoid colon (bx unremarkable), polyp, tubular adenoma  . Esophagogastroduodenoscopy 02/2009    query Barrett's but bx negative  . Esophagogastroduodenoscopy 12/30/2010    [name],gastritis, dilated 32F, sm HH, 1 small ulcer, Duodenal erosions, benign bx  . Flexible sigmoidoscopy 12/30/2010     [name],; internal hemorrhoids, anal papilla  . Heart stent   . Cardiac catheterization   . Nasal septoplasty w/ turbinoplasty 10/06/2011    Procedure: NASAL SEPTOPLASTY WITH TURBINATE REDUCTION;  Surgeon: Serena Colonel, MD;  Location: [facility] OR;  Service: ENT;  Laterality: Bilateral;   Social History:  reports that he has been smoking Cigarettes.  He has a 15 pack-year smoking history. He has never used smokeless tobacco. He reports that he drinks about 1.2 ounces of alcohol per week. He reports that he does not use illicit drugs.  No Known Allergies Family History  Problem Relation Age of Onset  . Lung disease Father     deceased, black lung  . Heart disease Mother     blood clots  . Cancer Paternal Uncle     unknown type  . Colon cancer Neg Hx   . Cancer Maternal Aunt     unknown type  . Kidney failure Maternal Uncle     Prior to Admission medications   Medication Sig Start Date End Date Taking? Authorizing Provider  albuterol (PROVENTIL) (2.5 MG/3ML) 0.083% nebulizer solution Take 2.5 mg by nebulization every 6 (six) hours as needed. For shortness of breath   Yes Historical Provider, MD  albuterol-ipratropium (COMBIVENT) 18-103 MCG/ACT inhaler Inhale 2 puffs into the lungs every 6 (six) hours as needed. For shortness of breath   Yes Historical Provider, MD  ALPRAZolam (XANAX) 1 MG tablet Take 1 tablet (1 mg total) by mouth 4 (four) times daily as needed. For anxiety 10/22/11  Yes Dorene Ar, MD  aspirin EC 81 MG tablet Take 81 mg by mouth daily.    Yes  Historical Provider, MD  budesonide-formoterol (SYMBICORT) 80-4.5 MCG/ACT inhaler Inhale 2 puffs into the lungs 2 (two) times daily.     Yes Historical Provider, MD  gabapentin (NEURONTIN) 300 MG capsule Take 300-600 mg by mouth 2 (two) times daily. Patient takes 1 capsule in the morning and 2 capsules at night   Yes Dorene Ar, MD  HYDROcodone-acetaminophen [facility]) 7.5-325 MG per tablet Take 7.5-325 mg by mouth Three times a day. 01/03/12  Yes Historical Provider, MD  metoprolol succinate (TOPROL-XL) 25 MG 24 hr tablet Take 25 mg by mouth daily.   Yes Historical Provider, MD  OLANZapine (ZYPREXA) 5 MG tablet Take 5 mg by mouth at bedtime.    Yes Historical Provider, MD  pravastatin (PRAVACHOL) 40 MG tablet Take 40 mg by mouth daily.   Yes Historical Provider, MD  pravastatin (PRAVACHOL) 40 MG tablet TAKE  1 TABLET EVERY DAY 12/23/11  Yes Kathlen Brunswick, MD  Testosterone (ANDROGEL PUMP) 1.25 GM/ACT (1%) GEL Place onto the skin 4 (four) times daily.     Yes Historical Provider, MD  verapamil (CALAN-SR) 180 MG CR tablet Take 180 mg by mouth daily.   Yes Kathlen Brunswick, MD  promethazine (PHENERGAN) 25 MG suppository Place 1 suppository (25 mg total) rectally every 6 (six) hours as needed for nausea. 10/06/11 10/13/11  Serena Colonel, MD   Physical Exam: Blood pressure 127/84, pulse 59, temperature 97.7 F (36.5 C), resp. rate 20, height 5\' 10"  (1.778 m), weight 82.555 kg (182 lb), SpO2 98.00%. Filed Vitals:   01/26/12 1100 01/26/12 1126 01/26/12 1151 01/26/12 1217  BP: 125/88 111/86 128/84 127/84  Pulse: 61 64 58 59  Temp:      Resp: 13  14 20   Height:      Weight:      SpO2: 95% 94% 98% 98%   BP 127/84  Pulse 59  Temp 97.7 F (36.5 C)  Resp 20  Ht 5\' 10"  (1.778 m)  Wt 82.555 kg (182 lb)  BMI 26.11 kg/m2  SpO2 98%  General Appearance:    appears uncomfortable. Slightly pale and diaphoretic-appearing   Head:    Normocephalic, without obvious abnormality, atraumatic  Eyes:    PERRL,  conjunctiva/corneas clear, EOM's intact, fundi    benign, both eyes          Nose:   Nares normal, septum midline, mucosa normal, no drainage   or sinus tenderness  Throat:   Lips, mucosa, and tongue normal; teeth and gums normal  Neck:   Supple, symmetrical, trachea midline, no adenopathy;       thyroid:  No enlargement/tenderness/nodules; no carotid   bruit or JVD  Back:     Symmetric, no curvature, ROM normal, no CVA tenderness  Lungs:     Clear to auscultation bilaterally, respirations unlabored  Chest wall:    No tenderness or deformity  Heart:    Regular rate and rhythm, S1 and S2 normal, no murmur, rub   or gallop  Abdomen:     Soft, non-tender, bowel sounds active all four quadrants,    no masses, no organomegaly  Genitalia:   deferred   Rectal:   deferred   Extremities:   Extremities normal, atraumatic, no cyanosis or edema  Pulses:   2+ and symmetric all extremities  Skin:   slightly diaphoretic and clammy   Lymph nodes:   Cervical, supraclavicular, and axillary nodes normal  Neurologic:   CNII-XII intact. Normal strength, sensation and reflexes      throughout    Psychiatric: Cooperative, normal affect  Labs on Admission:  Basic Metabolic Panel:  Lab 01/26/12 1610  NA 139  K 3.1*  CL 105  CO2 25  GLUCOSE 130*  BUN 11  CREATININE 0.83  CALCIUM 9.0  MG --  PHOS --   Liver Function Tests:  Lab 01/26/12 0835  AST 21  ALT 31  ALKPHOS 103  BILITOT 0.4  PROT 6.3  ALBUMIN 3.5   No results found for this basename: LIPASE:5,AMYLASE:5 in the last 168 hours No results found for this basename: AMMONIA:5 in the last 168 hours CBC:  Lab 01/26/12 0835  WBC 6.5  NEUTROABS 3.9  HGB 16.0  HCT 43.9  MCV 87.3  PLT 156   Cardiac Enzymes:  Lab 01/26/12 1147 01/26/12 0835  CKTOTAL 130 --  CKMB 2.6 --  CKMBINDEX -- --  TROPONINI <0.30 <0.30   BNP: No components found with this basename: POCBNP:5 CBG: No results found for this basename: GLUCAP:5 in the last  168 hours  Radiological Exams on Admission: Dg Chest Port 1 View  01/26/2012  *RADIOLOGY REPORT*  Clinical Data: Chest pain.  PORTABLE CHEST - 1 VIEW  Comparison: 08/31/2011  Findings: Heart and mediastinal contours are within normal limits. No focal opacities or effusions.  No acute bony abnormality.  IMPRESSION: No active cardiopulmonary disease.  Original Report Authenticated By: Cyndie Chime, M.D.    EKG: NSR   Eian Vandervelden L [facility]

## 2012-01-26 NOTE — Progress Notes (Signed)
HPI:  This is a 56 yr old white male patient with history of CAD S/P MI in 1993 treated with PTCA CFX. Last cath in 2000 showed 50%OM1 & OM2, 30%LAD,80% nondominant RCA, 60% small OM2, EF 55% with severe inferior basal hypokinesis.  He complains of 1-2 week history of fatigue, and exertional chest pressure while walking to the mailbox or doing any exertion. The pain would ease with rest. This am after getting up his left arm began to ache followed by severe chest pressure radiating to his neck and left arm associated with presyncope. It was unrelieved with 2 NTG so he came to the ER at [facility]. He still has some pain on IV NTG, heparin, MSO4, but the left arm pain has improved. EKG is without acute change and first cardiac enzymes are normal.  He continues to smoke 1/2 pack cigarettes daily.  No Known Allergies  No current facility-administered medications on file prior to encounter.   Current Outpatient Prescriptions on File Prior to Encounter  Medication Sig Dispense Refill  . albuterol (PROVENTIL) (2.5 MG/3ML) 0.083% nebulizer solution Take 2.5 mg by nebulization every 6 (six) hours as needed. For shortness of breath      . albuterol-ipratropium (COMBIVENT) 18-103 MCG/ACT inhaler Inhale 2 puffs into the lungs every 6 (six) hours as needed. For shortness of breath      . ALPRAZolam (XANAX) 1 MG tablet Take 1 tablet (1 mg total) by mouth 4 (four) times daily as needed. For anxiety  120 tablet  5  . budesonide-formoterol (SYMBICORT) 80-4.5 MCG/ACT inhaler Inhale 2 puffs into the lungs 2 (two) times daily.        Marland Kitchen gabapentin (NEURONTIN) 300 MG capsule Take 300-600 mg by mouth 2 (two) times daily. Patient takes 1 capsule in the morning and 2 capsules at night      . metoprolol succinate (TOPROL-XL) 25 MG 24 hr tablet Take 25 mg by mouth daily.      Marland Kitchen OLANZapine (ZYPREXA) 5 MG tablet Take 5 mg by mouth at bedtime.       . pravastatin (PRAVACHOL) 40 MG tablet Take 40 mg by mouth daily.      .  pravastatin (PRAVACHOL) 40 MG tablet TAKE 1 TABLET EVERY DAY  30 tablet  6  . Testosterone (ANDROGEL PUMP) 1.25 GM/ACT (1%) GEL Place onto the skin 4 (four) times daily.        . verapamil (CALAN-SR) 180 MG CR tablet Take 180 mg by mouth daily.      . promethazine (PHENERGAN) 25 MG suppository Place 1 suppository (25 mg total) rectally every 6 (six) hours as needed for nausea.  12 each  0  . DISCONTD: amLODipine (NORVASC) 5 MG tablet Take 5 mg by mouth daily.       Marland Kitchen DISCONTD: FLUoxetine (PROZAC) 20 MG capsule Take 20 mg by mouth 2 (two) times daily. Take 2 caps at one time       Past Medical History  Diagnosis Date  . ASCVD (arteriosclerotic cardiovascular disease)     inferior MI with circumflex PTCA in 1993;cath in 2000-50% OM 1&2,30% LAD ;2008-25% LAD, 80% nondominant right ,60% small OM 2,EF of 55% with severe basilar inferior hypokinesis; stress nuclear study in 04/2008 moderate inferior scar with peri-infarction ischemic  . Syncope   . Hyperlipidemia   . Hypertension   . Tobacco abuse     50 pack years continuing at one halp pack daily  . COPD (chronic obstructive pulmonary disease)  mild ;excercise induced hypoxemia by cp stress test ;asthma ,bronchitis,  . Restless leg syndrome   . Anxiety and depression   . GERD (gastroesophageal reflux disease)   . Inflammatory polyps of colon with rectal bleeding   . Carcinoma in situ of colon 2004    rectal polyp  . H. pylori infection 2004    treated  . Sleep apnea     does not use CPAP  . Gastritis 12/30/10    EGD Dr Jena Gauss  . Colitis, ischemic 2011  . Tubular adenoma of colon 06/2009    Colonosocpy Dr Jena Gauss  . Leukocytosis     Dr Shon Baton  . Depression   . Anxiety   . Lung nodule 07/16/2011   Past Surgical History  Procedure Date  . Colonoscopy w/ polypectomy 2004    rectal polyp with carcinoma in situ removed via colonoscopy  . Hand surgery     surgical intervention for injury of the fingers of the left hand many years  ago  . Cholecystectomy 2004  . Colonoscopy 06/2009    normal terminal ileum, segmental mild inflammation of sigmoid colon (bx unremarkable), polyp, tubular adenoma  . Esophagogastroduodenoscopy 02/2009    query Barrett's but bx negative  . Esophagogastroduodenoscopy 12/30/2010    [name],gastritis, dilated 16F, sm HH, 1 small ulcer, Duodenal erosions, benign bx  . Flexible sigmoidoscopy 12/30/2010     [name],; internal hemorrhoids, anal papilla  . Heart stent   . Cardiac catheterization   . Nasal septoplasty w/ turbinoplasty 10/06/2011    Procedure: NASAL SEPTOPLASTY WITH TURBINATE REDUCTION;  Surgeon: Serena Colonel, MD;  Location: [facility] OR;  Service: ENT;  Laterality: Bilateral;   Family History  Problem Relation Age of Onset  . Lung disease Father     deceased, black lung  . Heart disease Mother     blood clots  . Cancer Paternal Uncle     unknown type  . Colon cancer Neg Hx   . Cancer Maternal Aunt     unknown type  . Kidney failure Maternal Uncle    History   Social History  . Marital Status: Married    Spouse Name: N/A    Number of Children: 3  . Years of Education: N/A   Occupational History  . disable     [employer]   Social History Main Topics  . Smoking status: Current Some Day Smoker -- 0.5 packs/day for 30 years    Types: Cigarettes    Last Attempt to Quit: 01/13/2011  . Smokeless tobacco: Never Used  . Alcohol Use: 1.2 oz/week    2 Cans of beer per week     Drinks a beer occasionally  . Drug Use: No  . Sexually Active: Not on file   Other Topics Concern  . Not on file   Social History Narrative   3 stepchildren   ROS: See HPI Eyes: Negative Ears:Negative for hearing loss, tinnitus Cardiovascular: Negative for  palpitations,irregular heartbeat, dyspnea, orthopnea, paroxysmal nocturnal dyspnea and syncope,edema, claudication, cyanosis,.  Respiratory:   Has significant COPD, chronic cough,negative hemoptysis, sleep disturbances due to breathing,  sputum production and wheezing.   Endocrine: Negative for cold intolerance and heat intolerance.  Hematologic/Lymphatic: Negative for adenopathy and bleeding problem. Does not bruise/bleed easily.  Musculoskeletal: Negative.   Gastrointestinal:Occasional epigastric pain after taking meds, Negative for nausea, vomiting, reflux,  diarrhea, constipation.   Neurological: Negative.  Allergic/Immunologic: Negative for environmental allergies.  PHYSICAL EXAM: Well-nournished, in no acute distress. HEENT:Head  is normocephalic without sign of trauma, extraocular eye movements are intact, pupils were equal and reactive to light accommodation, nasal mucosa is moist, throat is without erythema or exudate Neck: No JVD, HJR, Bruit, or thyroid enlargement Lungs: Decreased breath sounds throughout,No tachypnea, clear without wheezing, rales, or rhonchi Cardiovascular: RRR, PMI not displaced, heart sounds normal, S4 present; no murmurs, bruit, thrill, or heave. Abdomen: BS normal. Soft without organomegaly, masses, lesions or tenderness. Extremities: without cyanosis, clubbing or edema. Good distal pulses bilateral SKin: Warm, no lesions or rashes  Musculoskeletal: No deformities Neuro: no focal signs  BP 127/84  Pulse 59  Temp 97.7 F (36.5 C)  Resp 20  Ht 5\' 10"  (1.778 m)  Wt 182 lb (82.555 kg)  BMI 26.11 kg/m2  SpO2 98%  EKG:NSR, no acute change; nondiagnostic inferior Q waves Admission on 01/26/2012  Component Date Value Range Status   ASSESSMENT AND PLAN:  1. Unstable Angina: transfer to [facility] hospital via [facility] for further treatment & evaluation with possible cardiac catheterization today 2. Hx CAD S/P MI 1993 PTCA CFX, last cath 2000-see above for details 3.HTN 4.Hyperlipidemia 5.COPD-continues to smoke 1/2ppd 6. History of Anxiety/Depression 7. Hypokalemia: will replace.  Cardiology Attending Patient interviewed and examined. Discussed with Herma Carson, PA-C.  Above note  annotated and modified based upon my findings.  Chest pain is highly suggestive of an ischemic etiology despite normal initial cardiac markers and no impressive EKG abnormalities.  Patient will be transferred to [facility] for cardiac catheterization, urgently if chest pain cannot be controlled or subsequent cardiac markers or EKGs are abnormal.  [place] Bing, MD 01/26/2012, 3:43 PM

## 2012-01-26 NOTE — CV Procedure (Signed)
  Cardiac Catheterization Procedure Note  Name: DEREON CORKERY MRN: [id number] DOB: 06/09/1956  Procedure: Left Heart Cath, Selective Coronary Angiography, LV angiography  Indication:    Procedural details: The right groin was prepped, draped, and anesthetized with 1% lidocaine. Using modified Seldinger technique, a 5 French sheath was introduced into the right femoral artery. Standard Judkins catheters were used for coronary angiography and left ventriculography. Catheter exchanges were performed over a guidewire. There were no immediate procedural complications. The patient was transferred to the post catheterization recovery area for further monitoring.  Of note I attempted to obtain access through the right radial that was prepped and draped in a sterile fashion.  However, I was unable to cannulate the vessel.  Procedural Findings:   Hemodynamics:     AO 135/83    LV 136/19   Coronary angiography:   Coronary dominance:    Codominant right  Left mainstem:   Normal.  Left anterior descending (LAD):   Ostial 30%,  Proximal 30% focal, 30% mid. Moderate sized branching D1 with mild calcification and luminal irregularities.    Left circumflex (LCx):  AV groove large.  Proximal focal 30%.  Small ramus intermedius normal.  Large OM1 proximal 25%.  Mid 25%.  OM2 moderate sized with long ostial 80%.  PL x 2 small and normal  Right coronary artery (RCA):  Codominant and moderate sized with proximal 99% focal stenosis.  Left ventriculography: LVEF is estimated at 65, there is no significant mitral regurgitation.  Mild inferior hypokinesis.    Final Conclusions:  Severe single vessel disease.  Preserved EF  Recommendations: Plan PCI of the RCA.  Medical management of OM stenosis which is stable and not a culprit vessel.  Rollene Rotunda 01/26/2012, 5:10 PM

## 2012-01-26 NOTE — Significant Event (Signed)
1451pm-arrived to 2314 via stretcher with care-link. Pt arrived with 2 PIV on right arm, with infusions of heparin @ 11cc/hour and nitroglycerin drip @ (was increased in en route per report by [facility]). Pt denied pain at this time. Placed on monitors. VS stable. No family at bedside. Pt oriented to unit and room; call bell within reach, bed in lowest position. Will monitor. Isaiyah Feldhaus, Charity fundraiser.

## 2012-01-26 NOTE — Progress Notes (Signed)
ANTICOAGULATION CONSULT NOTE - Initial Consult  Pharmacy Consult for Heparin Indication: chest pain/ACS  No Known Allergies  Patient Measurements: Height: 5\' 10"  (177.8 cm) Weight: 182 lb (82.555 kg) IBW/kg (Calculated) : 73  Heparin Dosing Weight: 82.5kg  Vital Signs: Temp: 97.7 F (36.5 C) (08/14 0824) BP: 128/84 mmHg (08/14 1151) Pulse Rate: 58  (08/14 1151)  Labs:  [facility] 01/26/12 0835  HGB 16.0  HCT 43.9  PLT 156  APTT --  LABPROT --  INR --  HEPARINUNFRC --  CREATININE 0.83  CKTOTAL --  CKMB --  TROPONINI <0.30    Estimated Creatinine Clearance: 103.8 ml/min (by C-G formula based on Cr of 0.83).   Medical History: Past Medical History  Diagnosis Date  . ASCVD (arteriosclerotic cardiovascular disease)     inferior MI with circumflex PTCA in 1993;cath in 2000-50% OM 1&2,30% LAD ;2008-25% LAD, 80% nondominant right ,60% small OM 2,EF of 55% with severe basilar inferior hypokinesis; stress nuclear study in 04/2008 moderate inferior scar with peri-infarction ischemic  . Syncope   . Hyperlipidemia   . Hypertension   . Tobacco abuse     50 pack years continuing at one halp pack daily  . COPD (chronic obstructive pulmonary disease)     mild ;excercise induced hypoxemia by cp stress test ;asthma ,bronchitis,  . Restless leg syndrome   . Anxiety and depression   . GERD (gastroesophageal reflux disease)   . Inflammatory polyps of colon with rectal bleeding   . Carcinoma in situ of colon 2004    rectal polyp  . H. pylori infection 2004    treated  . Sleep apnea     does not use CPAP  . Gastritis 12/30/10    EGD Dr Jena Gauss  . Colitis, ischemic 2011  . Tubular adenoma of colon 06/2009    Colonosocpy Dr Jena Gauss  . Leukocytosis     Dr Shon Baton  . Depression   . Anxiety   . Lung nodule 07/16/2011    Medications:  Scheduled:    . aspirin  324 mg Oral Once  .  morphine injection  2 mg Intravenous Once  . nitroGLYCERIN  1 inch Topical Once  . potassium  chloride SA  20 mEq Oral Once    Assessment: 56 yo M with hx CAD presenting to ED with chest pain. Cardiac enzymes negative x1. No history of bleeding noted.   Goal of Therapy:  Heparin level 0.3-0.7 units/ml Monitor platelets by anticoagulation protocol: Yes   Plan:  1) Heparin 4000 units IV bolus x1 2) Heparin drip at 1100 units/hr (~13 units/kg/hr) 3) Check 6hr heparin level  4) Check daily heparin level & CBC while on heparin   Elson Clan 01/26/2012,12:15 PM

## 2012-01-26 NOTE — ED Provider Notes (Signed)
History  This chart was scribed for [name] Lennert, MD by Ladona Ridgel Day. This patient was seen in room APA06/APA06 and the patient's care was started at 0819.   CSN: [id number]  Arrival date & time 01/26/12  0819   First MD Initiated Contact with Patient 01/26/12 0825      Chief Complaint  Patient presents with  . Chest Pain    Patient is a 56 y.o. male presenting with chest pain. The history is provided by the patient and the EMS personnel. No language interpreter was used.  Chest Pain The chest pain began 1 - 2 hours ago. Chest pain occurs constantly. The chest pain is unchanged. The severity of the pain is moderate. The quality of the pain is described as pressure-like. The pain radiates to the left arm. Primary symptoms include shortness of breath. Pertinent negatives for primary symptoms include no fatigue, no cough and no abdominal pain. He tried nitroglycerin for the symptoms. Risk factors include male gender.  His past medical history is significant for MI.  Pertinent negatives for past medical history include no seizures.    [name] is a 56 y.o. male brought in by ambulance, who presents to the Emergency Department complaining of constant chest pain radiating to his left arm since this AM when he woke up. EMS gave 1 SL NTG and had some relief of his symptoms. He had an MI when he was 89 and had a stent placed within the year following his MI. He states his symptoms today are similar to his previous MI symptoms. He also complains of SOB as associated symptoms. He states that he is currently having mild pain and denies any other injuries/illnesses at this time.  His Cardiologist is Dr. Levonne [name].  Past Medical History  Diagnosis Date  . ASCVD (arteriosclerotic cardiovascular disease)     inferior MI with circumflex PTCA in 1993;cath in 2000-50% OM 1&2,30% LAD ;2008-25% LAD, 80% nondominant right ,60% small OM 2,EF of 55% with severe basilar inferior hypokinesis; stress nuclear  study in 04/2008 moderate inferior scar with peri-infarction ischemic  . Syncope   . Hyperlipidemia   . Hypertension   . Tobacco abuse     50 pack years continuing at one halp pack daily  . COPD (chronic obstructive pulmonary disease)     mild ;excercise induced hypoxemia by cp stress test ;asthma ,bronchitis,  . Restless leg syndrome   . Anxiety and depression   . GERD (gastroesophageal reflux disease)   . Inflammatory polyps of colon with rectal bleeding   . Carcinoma in situ of colon 2004    rectal polyp  . H. pylori infection 2004    treated  . Sleep apnea     does not use CPAP  . Gastritis 12/30/10    EGD Dr [name]  . Colitis, ischemic 2011  . Tubular adenoma of colon 06/2009    Colonosocpy Dr [name]  . Leukocytosis     Dr [name]  . Depression   . Anxiety   . Lung nodule 07/16/2011  . Myocardial infarction   . Emphysema of lung     Past Surgical History  Procedure Date  . Colonoscopy w/ polypectomy 2004    rectal polyp with carcinoma in situ removed via colonoscopy  . Hand surgery     surgical intervention for injury of the fingers of the left hand many years ago  . Cholecystectomy 2004  . Colonoscopy 06/2009    normal terminal ileum, segmental mild inflammation of  sigmoid colon (bx unremarkable), polyp, tubular adenoma  . Esophagogastroduodenoscopy 02/2009    query Barrett's but bx negative  . Esophagogastroduodenoscopy 12/30/2010    [name],gastritis, dilated 47F, sm HH, 1 small ulcer, Duodenal erosions, benign bx  . Flexible sigmoidoscopy 12/30/2010     [name],; internal hemorrhoids, anal papilla  . Heart stent   . Cardiac catheterization   . Nasal septoplasty w/ turbinoplasty 10/06/2011    Procedure: NASAL SEPTOPLASTY WITH TURBINATE REDUCTION;  Surgeon: [name] Colonel, MD;  Location: [name];  Service: ENT;  Laterality: Bilateral;    Family History  Problem Relation Age of Onset  . Lung disease Father     deceased, black lung  . Heart disease  Mother     blood clots  . Cancer Paternal Uncle     unknown type  . Colon cancer Neg Hx   . Cancer Maternal Aunt     unknown type  . Kidney failure Maternal Uncle     History  Substance Use Topics  . Smoking status: Current Some Day Smoker -- 0.5 packs/day for 30 years    Types: Cigarettes    Last Attempt to Quit: 01/13/2011  . Smokeless tobacco: Never Used  . Alcohol Use: 1.2 oz/week    2 Cans of beer per week     Drinks a beer occasionally      Review of Systems  Constitutional: Negative for fatigue.  HENT: Negative for congestion, sinus pressure and ear discharge.   Eyes: Negative for discharge.  Respiratory: Positive for shortness of breath. Negative for cough.   Cardiovascular: Positive for chest pain.  Gastrointestinal: Negative for abdominal pain and diarrhea.  Genitourinary: Negative for frequency and hematuria.  Musculoskeletal: Negative for back pain.  Skin: Negative for rash.  Neurological: Negative for seizures and headaches.  Hematological: Negative.   Psychiatric/Behavioral: Negative for hallucinations.  All other systems reviewed and are negative.    Allergies  Review of patient's allergies indicates no known allergies.  Home Medications   Current Outpatient Rx  Name Route Sig Dispense Refill  . ALBUTEROL SULFATE (2.5 MG/3ML) 0.083% IN NEBU Nebulization Take 2.5 mg by nebulization every 6 (six) hours as needed. For shortness of breath    . IPRATROPIUM-ALBUTEROL 18-103 MCG/ACT IN AERO Inhalation Inhale 2 puffs into the lungs every 6 (six) hours as needed. For shortness of breath    . ALPRAZOLAM 1 MG PO TABS Oral Take 1 tablet (1 mg total) by mouth 4 (four) times daily as needed. For anxiety 120 tablet 5  . ASPIRIN 81 MG PO TABS Oral Take 81 mg by mouth daily.     . ATORVASTATIN CALCIUM 80 MG PO TABS Oral Take 80 mg by mouth daily.    . BUDESONIDE-FORMOTEROL FUMARATE 80-4.5 MCG/ACT IN AERO Inhalation Inhale 2 puffs into the lungs 2 (two) times daily.       Marland Kitchen GABAPENTIN 300 MG PO CAPS Oral Take 300-600 mg by mouth 2 (two) times daily. Patient takes 1 capsule in the morning and 2 capsules at night    . METOPROLOL SUCCINATE ER 25 MG PO TB24 Oral Take 25 mg by mouth daily.    Marland Kitchen OLANZAPINE 5 MG PO TABS Oral Take 5 mg by mouth at bedtime.     Marland Kitchen PANTOPRAZOLE SODIUM 40 MG PO TBEC Oral Take 40 mg by mouth daily.    Marland Kitchen PANTOPRAZOLE SODIUM 40 MG PO TBEC  TAKE 1 TABLET BY MOUTH EVERY DAY 30 tablet 3  . PRAVASTATIN SODIUM  40 MG PO TABS Oral Take 40 mg by mouth daily.    Marland Kitchen PRAVASTATIN SODIUM 40 MG PO TABS  TAKE 1 TABLET EVERY DAY 30 tablet 6    Dispense as written.    Schedule 9 month follow up appointment in August.  . PROMETHAZINE HCL 25 MG RE SUPP Rectal Place 1 suppository (25 mg total) rectally every 6 (six) hours as needed for nausea. 12 each 0  . TESTOSTERONE 1.25 GM/ACT (1%) TD GEL Transdermal Place onto the skin 4 (four) times daily.      Marland Kitchen VERAPAMIL HCL ER 180 MG PO TBCR Oral Take 180 mg by mouth daily.      Triage Vitals: BP 144/84  Pulse 66  Temp 97.7 F (36.5 C)  Resp 16  Ht 5\' 10"  (1.778 m)  Wt 182 lb (82.555 kg)  BMI 26.11 kg/m2  SpO2 98%  Physical Exam  Nursing note and vitals reviewed. Constitutional: He is oriented to person, place, and time. He appears well-developed.  HENT:  Head: Normocephalic and atraumatic.  Eyes: Conjunctivae and EOM are normal. No scleral icterus.  Neck: Neck supple. No thyromegaly present.  Cardiovascular: Normal rate, regular rhythm and normal heart sounds.  Exam reveals no gallop and no friction rub.   No murmur heard. Pulmonary/Chest: Effort normal. No stridor. He has wheezes (moderate wheezing bilaterally. ). He has no rales. He exhibits no tenderness.  Abdominal: He exhibits no distension. There is no tenderness. There is no rebound.  Musculoskeletal: Normal range of motion. He exhibits no edema.  Lymphadenopathy:    He has no cervical adenopathy.  Neurological: He is alert and oriented to  person, place, and time. Coordination normal.  Skin: Skin is warm. No rash noted. No erythema.  Psychiatric: He has a normal mood and affect. His behavior is normal.    ED Course  Procedures (including critical care time) DIAGNOSTIC STUDIES: Oxygen Saturation is 98% on room air, normal by my interpretation.    COORDINATION OF CARE: At 835 AM Discussed treatment plan with patient which includes NTG, ASA, heart markers, blood work, and CXR. Patient agrees.   At 1015 AM I rechecked patient and his chest pain has somewhat improved since arrival to ED. Discussed plan to speak with cardiologist and possibly admit him to the hospital.   Labs Reviewed  CBC WITH DIFFERENTIAL  COMPREHENSIVE METABOLIC PANEL  TROPONIN I   No results found.   No diagnosis found.   Date: 01/26/2012  Rate: 62  Rhythm: normal sinus rhythm  QRS Axis: normal  Intervals: normal  ST/T Wave abnormalities: normal  Conduction Disutrbances:none  Narrative Interpretation:   Old EKG Reviewed: none available    MDM  The chart was scribed for me under my direct supervision.  I personally performed the history, physical, and medical decision making and all procedures in the evaluation of this patient.[name] Lennert, MD 01/26/12 1249

## 2012-01-26 NOTE — ED Notes (Signed)
Pt reports cp that started prior to arrival. Pt took 1 SL nitro 20 mins PTA with minimal relief. States pain is "pressure and moves to left arm" constant pain with sob as well.  Skin warm/dry. resp even/nonlabored.

## 2012-01-26 NOTE — CV Procedure (Signed)
   CARDIAC CATH NOTE  Name: [name] MRN: [id number] DOB: 05-02-1956  Procedure: PTCA and stenting of the RCA  Indication: Unstable Angina. 56 year old gentleman with known CAD presents with typical symptoms of unstable angina. He's having significant rest pain. He underwent diagnostic catheterization demonstrating critical stenosis of the codominant right coronary artery. There was a 99% lesion present. He has moderate left circumflex disease and this was stable from his previous study. After review of the films we elected to proceed with PCI of the right coronary artery. The planned approach was to use a bare-metal stent because he has had multiple polyps and rectal bleeding. We thought with a bare-metal stent in his Plavix duration could be limited to 30 days.  Procedural Details: There is an indwelling 5 French sheath in the right femoral artery. Plavix 600 mg was administered. Weight-based bivalirudin was given for anticoagulation. Once a therapeutic ACT was achieved, a 5 [place] JR 4 guide catheter was inserted.  A cougar coronary guidewire was used to cross the lesion.  The lesion was predilated with a 2.0 x 12 mm balloon.  The lesion was then stented with a 2.5 x 15 mm MultiLink vision bare-metal stent.  The stent was postdilated with a 2.75 x 12 mm noncompliant balloon.  Following PCI, there was 0% residual stenosis and TIMI-3 flow. Final angiography confirmed an excellent result. The patient tolerated the procedure well. There were no immediate procedural complications. Femoral hemostasis was achieved with an Angio-Seal device. The patient was transferred to the post catheterization recovery area for further monitoring.  Lesion Data: Vessel: RCA/mid Percent stenosis (pre): 99 TIMI-flow (pre):  3 Stent:  2.5 x 15 mm bare-metal Percent stenosis (post): 0 TIMI-flow (post): 3  Conclusions: Successful PCI to right coronary artery using a bare-metal stent platform  Recommendations:  Aspirin 81 mg indefinitely and Plavix 75 mg for 30 days  Tonny Bollman 01/26/2012, 6:31 PM

## 2012-01-26 NOTE — Progress Notes (Signed)
Pt here from [facility]. Spoke with pt/wife.  His chest pain is much improved, he is uncomfortable but says no more chest pressure. Slight SOB.  No change in physical exam. VSS. On heparin/NTG drips.  The risks and benefits of a cardiac catheterization including, but not limited to, death, stroke, MI, kidney damage and bleeding were discussed with the patient and his wife who indicate understanding and agree to proceed.   He has been NPO except for KDur totaling 40 meq given at [facility].  Bjorn Loser Barrett

## 2012-01-27 LAB — LIPID PANEL
Cholesterol: 131 mg/dL (ref 0–200)
HDL: 22 mg/dL — ABNORMAL LOW (ref >39)
LDL Cholesterol: 47 mg/dL (ref 0–99)
Triglycerides: 311 mg/dL — ABNORMAL HIGH (ref <150)
VLDL: 62 mg/dL — ABNORMAL HIGH (ref 0–40)

## 2012-01-27 LAB — BASIC METABOLIC PANEL
BUN: 9 mg/dL (ref 6–23)
Calcium: 8.6 mg/dL (ref 8.4–10.5)
GFR calc Af Amer: >90 mL/min (ref >90)
GFR calc non Af Amer: >90 mL/min (ref >90)
Glucose, Bld: 109 mg/dL — ABNORMAL HIGH (ref 70–99)
Sodium: 143 mEq/L (ref 135–145)

## 2012-01-27 LAB — CARDIAC PANEL(CRET KIN+CKTOT+MB+TROPI)
CK, MB: 2 ng/mL (ref 0.3–4.0)
Total CK: 93 U/L (ref 7–232)

## 2012-01-27 LAB — CBC
MCH: 31.4 pg (ref 26.0–34.0)
MCHC: 35.9 g/dL (ref 30.0–36.0)
Platelets: 148 10*3/uL — ABNORMAL LOW (ref 150–400)
RBC: 4.71 MIL/uL (ref 4.22–5.81)

## 2012-01-27 MED ORDER — NICOTINE 14 MG/24HR TD PT24
14.0000 mg | MEDICATED_PATCH | Freq: Every day | TRANSDERMAL | Status: DC
  Filled 2012-01-27: qty 1

## 2012-01-27 MED ORDER — METOPROLOL SUCCINATE ER 25 MG PO TB24
25.0000 mg | ORAL_TABLET | Freq: Every day | ORAL | Status: DC
  Administered 2012-01-27: 25 mg via ORAL
  Filled 2012-01-27: qty 1

## 2012-01-27 MED ORDER — CLOPIDOGREL BISULFATE 75 MG PO TABS: 75.0000 mg | ORAL_TABLET | Freq: Every day | ORAL | Status: DC

## 2012-01-27 MED ORDER — NICOTINE 14 MG/24HR TD PT24: 1.0000 | MEDICATED_PATCH | TRANSDERMAL | Status: AC

## 2012-01-27 MED ORDER — NITROGLYCERIN 0.4 MG SL SUBL: 0.4000 mg | SUBLINGUAL_TABLET | SUBLINGUAL | Status: DC | PRN

## 2012-01-27 MED FILL — Dextrose Inj 5%: INTRAVENOUS | Qty: 1000 | Status: AC

## 2012-01-27 NOTE — Progress Notes (Signed)
Pt noticed some blood drops on his R radial site. Pressure dressing applied, pulse is regular and strong, continue to monitor the site closely

## 2012-01-27 NOTE — Progress Notes (Signed)
Patient ID: [name], male   DOB: April 26, 1956, 56 y.o.   MRN: [id number] HPI:  This is a 56 yr old white male patient with history of CAD S/P MI in 1993 treated with PTCA CFX. Last cath in 2000 showed 50%OM1 & OM2, 30%LAD,80% nondominant RCA, 60% small OM2, EF 55% with severe inferior basal hypokinesis.  He complains of 1-2 week history of fatigue, and exertional chest pressure while walking to the mailbox or doing any exertion. The pain would ease with rest. This am after getting up his left arm began to ache followed by severe chest pressure radiating to his neck and left arm associated with presyncope. It was unrelieved with 2 NTG so he came to the ER at [facility]. He still has some pain on IV NTG, heparin, MSO4, but the left arm pain has improved. EKG is without acute change and first cardiac enzymes are normal.  He continues to smoke 1/2 pack cigarettes daily.  Cath and PCI yesterday by Dr Excell Seltzer for 99% mid RCA.  Had some bleeding from right wrist overnight Cath done from leg as couldn't access cors from wrist  No Known Allergies  No current facility-administered medications on file prior to encounter.   Current Outpatient Prescriptions on File Prior to Encounter  Medication Sig Dispense Refill  . albuterol (PROVENTIL) (2.5 MG/3ML) 0.083% nebulizer solution Take 2.5 mg by nebulization every 6 (six) hours as needed. For shortness of breath      . albuterol-ipratropium (COMBIVENT) 18-103 MCG/ACT inhaler Inhale 2 puffs into the lungs every 6 (six) hours as needed. For shortness of breath      . ALPRAZolam (XANAX) 1 MG tablet Take 1 tablet (1 mg total) by mouth 4 (four) times daily as needed. For anxiety  120 tablet  5  . budesonide-formoterol (SYMBICORT) 80-4.5 MCG/ACT inhaler Inhale 2 puffs into the lungs 2 (two) times daily.        Marland Kitchen gabapentin (NEURONTIN) 300 MG capsule Take 300-600 mg by mouth 2 (two) times daily. Patient takes 1 capsule in the morning and 2 capsules at night      .  metoprolol succinate (TOPROL-XL) 25 MG 24 hr tablet Take 25 mg by mouth daily.      Marland Kitchen OLANZapine (ZYPREXA) 5 MG tablet Take 5 mg by mouth at bedtime.       . pravastatin (PRAVACHOL) 40 MG tablet Take 40 mg by mouth daily.      . pravastatin (PRAVACHOL) 40 MG tablet TAKE 1 TABLET EVERY DAY  30 tablet  6  . Testosterone (ANDROGEL PUMP) 1.25 GM/ACT (1%) GEL Place onto the skin 4 (four) times daily.        . verapamil (CALAN-SR) 180 MG CR tablet Take 180 mg by mouth daily.      . promethazine (PHENERGAN) 25 MG suppository Place 1 suppository (25 mg total) rectally every 6 (six) hours as needed for nausea.  12 each  0  . DISCONTD: amLODipine (NORVASC) 5 MG tablet Take 5 mg by mouth daily.       Marland Kitchen DISCONTD: FLUoxetine (PROZAC) 20 MG capsule Take 20 mg by mouth 2 (two) times daily. Take 2 caps at one time       Past Medical History  Diagnosis Date  . ASCVD (arteriosclerotic cardiovascular disease)     inferior MI with circumflex PTCA in 1993;cath in 2000-50% OM 1&2,30% LAD ;2008-25% LAD, 80% nondominant right ,60% small OM 2,EF of 55% with severe basilar inferior hypokinesis; stress nuclear study in  04/2008 moderate inferior scar with peri-infarction ischemic  . Syncope   . Hyperlipidemia   . Hypertension   . Tobacco abuse     50 pack years continuing at one halp pack daily  . COPD (chronic obstructive pulmonary disease)     mild ;excercise induced hypoxemia by cp stress test ;asthma ,bronchitis,  . Restless leg syndrome   . Anxiety and depression   . GERD (gastroesophageal reflux disease)   . Inflammatory polyps of colon with rectal bleeding   . Carcinoma in situ of colon 2004    rectal polyp  . H. pylori infection 2004    treated  . Sleep apnea     does not use CPAP  . Gastritis 12/30/10    EGD Dr Jena Gauss  . Colitis, ischemic 2011  . Tubular adenoma of colon 06/2009    Colonosocpy Dr Jena Gauss  . Leukocytosis     Dr Shon Baton  . Depression   . Anxiety   . Lung nodule 07/16/2011   Past  Surgical History  Procedure Date  . Colonoscopy w/ polypectomy 2004    rectal polyp with carcinoma in situ removed via colonoscopy  . Hand surgery     surgical intervention for injury of the fingers of the left hand many years ago  . Cholecystectomy 2004  . Colonoscopy 06/2009    normal terminal ileum, segmental mild inflammation of sigmoid colon (bx unremarkable), polyp, tubular adenoma  . Esophagogastroduodenoscopy 02/2009    query Barrett's but bx negative  . Esophagogastroduodenoscopy 12/30/2010    [name],gastritis, dilated 95F, sm HH, 1 small ulcer, Duodenal erosions, benign bx  . Flexible sigmoidoscopy 12/30/2010     [name],; internal hemorrhoids, anal papilla  . Heart stent   . Nasal septoplasty w/ turbinoplasty 10/06/2011    Procedure: NASAL SEPTOPLASTY WITH TURBINATE REDUCTION;  Surgeon: Serena Colonel, MD;  Location: [facility] OR;  Service: ENT;  Laterality: Bilateral;  . Cardiac catheterization   . Coronary angioplasty with stent placement 01/26/2012    "1; total is now 2"   Family History  Problem Relation Age of Onset  . Lung disease Father     deceased, black lung  . Heart disease Mother     blood clots  . Cancer Paternal Uncle     unknown type  . Colon cancer Neg Hx   . Cancer Maternal Aunt     unknown type  . Kidney failure Maternal Uncle    History   Social History  . Marital Status: Married    Spouse Name: N/A    Number of Children: 3  . Years of Education: N/A   Occupational History  . disable     [employer]   Social History Main Topics  . Smoking status: Current Some Day Smoker -- 0.5 packs/day for 30 years    Types: Cigarettes    Last Attempt to Quit: 01/13/2011  . Smokeless tobacco: Never Used  . Alcohol Use: 1.2 oz/week    2 Cans of beer per week     Drinks a beer occasionally  . Drug Use: No  . Sexually Active: Not on file   Other Topics Concern  . Not on file   Social History Narrative   3 stepchildren   ROS: See HPI Eyes:  Negative Ears:Negative for hearing loss, tinnitus Cardiovascular: Negative for  palpitations,irregular heartbeat, dyspnea, orthopnea, paroxysmal nocturnal dyspnea and syncope,edema, claudication, cyanosis,.  Respiratory:   Has significant COPD, chronic cough,negative hemoptysis, sleep disturbances due to breathing, sputum  production and wheezing.   Endocrine: Negative for cold intolerance and heat intolerance.  Hematologic/Lymphatic: Negative for adenopathy and bleeding problem. Does not bruise/bleed easily.  Musculoskeletal: Negative.   Gastrointestinal:Occasional epigastric pain after taking meds, Negative for nausea, vomiting, reflux,  diarrhea, constipation.   Neurological: Negative.  Allergic/Immunologic: Negative for environmental allergies.  PHYSICAL EXAM: Well-nournished, in no acute distress. HEENT:Head is normocephalic without sign of trauma, extraocular eye movements are intact, pupils were equal and reactive to light accommodation, nasal mucosa is moist, throat is without erythema or exudate Neck: No JVD, HJR, Bruit, or thyroid enlargement Lungs: Decreased breath sounds throughout,No tachypnea, clear without wheezing, rales, or rhonchi Cardiovascular: RRR, PMI not displaced, heart sounds normal, S4 present; no murmurs, bruit, thrill, or heave. Abdomen: BS normal. Soft without organomegaly, masses, lesions or tenderness. Extremities: without cyanosis, clubbing or edema. Good distal pulses bilateral SKin: Warm, no lesions or rashes  Musculoskeletal: No deformities Neuro: no focal signs Right wrist ok with good pulse and no hematoma Right femoral artery ok with good pulse and no hematoma or bruit  BP 134/77  Pulse 64  Temp 97.7 F (36.5 C) (Oral)  Resp 15  Ht 5\' 10"  (1.778 m)  Wt 186 lb 4.6 oz (84.5 kg)  BMI 26.73 kg/m2  SpO2 99%  EKG:NSR, no acute change; nondiagnostic inferior Q waves Admission on 01/26/2012  Component Date Value Range Status   ASSESSMENT AND  PLAN:  1.  CAD PCI stent RCA  Plavix and ASA for a month  Outpatient F/U with [facility] in [place] 3-4 weeks 2.HTN improved continue current meds 3.Hyperlipidemia 4.COPD-continues to smoke 1/2ppd Counseled for less than 10 minutes on cessation 5. History of Anxiety/Depression    Charlton Haws, MD 01/27/2012, 8:30 AM

## 2012-01-27 NOTE — Progress Notes (Signed)
CARDIAC REHAB PHASE I   PRE:  Rate/Rhythm: 72 SR    BP: sitting 128/79    SaO2: 99 RA  MODE:  Ambulation: 400 ft   POST:  Rate/Rhythm: 87 SR    BP: sitting 137/74     SaO2: 99 RA  Pt has limited walking ability, supposedly due to lungs. Slow walk, increased SOB and fatigue. Ed completed, greatly encouraging smoking cessation and cardiac or pulm rehab. Agreed and will send referral to . [id number]  Harriet Masson CES, ACSM

## 2012-01-27 NOTE — Discharge Summary (Signed)
CARDIOLOGY DISCHARGE SUMMARY   Patient ID: [name] MRN: [id number] DOB/AGE: [birth year] 55 y.o.  Admit date: 01/26/2012 Discharge date: 01/27/2012  Primary Discharge Diagnosis: Unstable angina pain - s/p 2.5 x 15 mm MultiLink vision bare-metal stent to the RCA   Secondary Discharge Diagnosis:  Past Medical History  Diagnosis Date  . ASCVD (arteriosclerotic cardiovascular disease)     inferior MI with circumflex PTCA in 1993;cath in 2000-50% OM 1&2,30% LAD ;2008-25% LAD, 80% nondominant right ,60% small OM 2,EF of 55% with severe basilar inferior hypokinesis; stress nuclear study in 04/2008 moderate inferior scar with peri-infarction ischemic  . Syncope   . Hyperlipidemia   . Hypertension   . Tobacco abuse     50 pack years continuing at one halp pack daily  . COPD (chronic obstructive pulmonary disease)     mild ;excercise induced hypoxemia by cp stress test ;asthma ,bronchitis,  . Restless leg syndrome   . Anxiety and depression   . GERD (gastroesophageal reflux disease)   . Inflammatory polyps of colon with rectal bleeding   . Carcinoma in situ of colon 2004    rectal polyp  . H. pylori infection 2004    treated  . Sleep apnea     does not use CPAP  . Gastritis 12/30/10    EGD Dr Jena Gauss  . Colitis, ischemic 2011  . Tubular adenoma of colon 06/2009    Colonosocpy Dr Jena Gauss  . Leukocytosis     Dr Shon Baton  . Depression   . Anxiety   . Lung nodule 07/16/2011   Procedures:  Left Heart Cath, Selective Coronary Angiography, LV angiography, PTCA and stenting of the RCA  Hospital Course: [name] is a 56 year old male with a history of CAD. He went to [facility] yesterday with chest pain. His pain improved with medical therapy including ASA/NTG/Heparin. However, he was still having some pain so he was transferred to [facility] and taken to the cath lab.  The cardiac catheterization results are below. He received a bare-metal stent to the RCA. He tolerated  the procedure well. Smoking cessation was discussed and the importance was reinforced several times.  He was seen by cardiac rehabilitation. He was given exercise guidelines and information on a heart-healthy diet.   On 01/27/2012, [name] was seen by Dr Eden Emms. He was ambulating without chest pain or SOB and considered stable for discharge, to follow up as an outpatient.  Labs:  Lab Results  Component Value Date   WBC 6.5 01/27/2012   HGB 14.8 01/27/2012   HCT 41.2 01/27/2012   MCV 87.5 01/27/2012   PLT 148* 01/27/2012    Lab 01/27/12 0329 01/26/12 0835  NA 143 --  K 3.7 --  CL 108 --  CO2 24 --  BUN 9 --  CREATININE 0.74 --  CALCIUM 8.6 --  PROT -- 6.3  BILITOT -- 0.4  ALKPHOS -- 103  ALT -- 31  AST -- 21  GLUCOSE 109* --    Basename 01/27/12 0330 01/26/12 2132 01/26/12 1843  CKTOTAL 93 100 108  CKMB 2.0 2.5 2.6  CKMBINDEX -- -- --  TROPONINI <0.30 <0.30 <0.30   Lipid Panel     Component Value Date/Time   CHOL 131 01/27/2012 0329   TRIG 311* 01/27/2012 0329   HDL 22* 01/27/2012 0329   CHOLHDL 6.0 01/27/2012 0329   VLDL 62* 01/27/2012 0329   LDLCALC 47 01/27/2012 0329     Radiology: Dg Chest Port  1 View 01/26/2012  *RADIOLOGY REPORT*  Clinical Data: Chest pain.  PORTABLE CHEST - 1 VIEW  Comparison: 08/31/2011  Findings: Heart and mediastinal contours are within normal limits. No focal opacities or effusions.  No acute bony abnormality.  IMPRESSION: No active cardiopulmonary disease.  Original Report Authenticated By: Cyndie Chime, M.D.   Cardiac Cath: Left mainstem: Normal.  Left anterior descending (LAD): Ostial 30%, Proximal 30% focal, 30% mid. Moderate sized branching D1 with mild calcification and luminal irregularities.  Left circumflex (LCx): AV groove large. Proximal focal 30%. Small ramus intermedius normal. Large OM1 proximal 25%. Mid 25%. OM2 moderate sized with long ostial 80%. PL x 2 small and normal  Right coronary artery (RCA): Codominant and moderate sized  with proximal 99% focal stenosis.  Left ventriculography: LVEF is estimated at 65, there is no significant mitral regurgitation. Mild inferior hypokinesis Lesion Data:  Vessel: RCA/mid  Percent stenosis (pre): 99  TIMI-flow (pre): 3  Stent: 2.5 x 15 mm bare-metal  Percent stenosis (post): 0  TIMI-flow (post): 3  EKG: 27-Jan-2012 03:54:54  Normal sinus rhythm Possible Anterior infarct , age undetermined Abnormal ECG 86mm/s 65mm/mV 100Hz  8.0.1 12SL 241 HD CID: 1 Referred by: DANIEL R BENSIMHON Unconfirmed Vent. rate 70 BPM PR interval 132 ms QRS duration 94 ms QT/QTc 406/438 ms P-R-T axes 66 74 66  FOLLOW UP PLANS AND APPOINTMENTS No Known Allergies Medication List  As of 01/27/2012 10:03 AM   TAKE these medications         albuterol (2.5 MG/3ML) 0.083% nebulizer solution   Commonly known as: PROVENTIL   Take 2.5 mg by nebulization every 6 (six) hours as needed. For shortness of breath      ALPRAZolam 1 MG tablet   Commonly known as: XANAX   Take 1 tablet (1 mg total) by mouth 4 (four) times daily as needed. For anxiety      ANDROGEL PUMP 1.25 GM/ACT (1%) Gel   Generic drug: Testosterone   Place onto the skin 4 (four) times daily.      aspirin EC 81 MG tablet   Take 81 mg by mouth daily.      clopidogrel 75 MG tablet   Commonly known as: PLAVIX   Take 1 tablet (75 mg total) by mouth daily.      COMBIVENT 18-103 MCG/ACT inhaler   Generic drug: albuterol-ipratropium   Inhale 2 puffs into the lungs every 6 (six) hours as needed. For shortness of breath      gabapentin 300 MG capsule   Commonly known as: NEURONTIN   Take 300-600 mg by mouth 2 (two) times daily. Patient takes 1 capsule in the morning and 2 capsules at night      HYDROcodone-acetaminophen 7.5-325 MG per tablet   Commonly known as: NORCO   Take 7.5-325 mg by mouth Three times a day.      metoprolol succinate 25 MG 24 hr tablet   Commonly known as: TOPROL-XL   Take 25 mg by mouth daily.       nicotine 14 mg/24hr patch   Commonly known as: NICODERM CQ - dosed in mg/24 hours   Place 1 patch onto the skin daily.      nitroGLYCERIN 0.4 MG SL tablet   Commonly known as: NITROSTAT   Place 1 tablet (0.4 mg total) under the tongue every 5 (five) minutes as needed for chest pain.      OLANZapine 5 MG tablet   Commonly known as: ZYPREXA   Take  5 mg by mouth at bedtime.      pravastatin 40 MG tablet   Commonly known as: PRAVACHOL   Take 40 mg by mouth daily.      pravastatin 40 MG tablet   Commonly known as: PRAVACHOL   TAKE 1 TABLET EVERY DAY      promethazine 25 MG suppository   Commonly known as: PHENERGAN   Place 1 suppository (25 mg total) rectally every 6 (six) hours as needed for nausea.      SYMBICORT 80-4.5 MCG/ACT inhaler   Generic drug: budesonide-formoterol   Inhale 2 puffs into the lungs 2 (two) times daily.      verapamil 180 MG CR tablet   Commonly known as: CALAN-SR   Take 180 mg by mouth daily.           Discharge Orders    Future Appointments: Provider: Department: Dept Phone: Center:   02/07/2012 11:40 AM Jodelle Gross, NP [facility] [phone] [facility]   04/10/2012 9:40 AM [facility] [phone] None   04/14/2012 9:30 AM [facility] [phone] [facility]   04/18/2012 10:30 AM Ellouise Newer, PA [facility] [phone] None     Follow-up Information    Follow up with Joni Reining, NP. (August 29th at 11:40 am)    Contact information:   [address] [place] [address] [phone]          BRING ALL MEDICATIONS WITH YOU TO FOLLOW UP APPOINTMENTS  Time spent with patient to include physician time: 38 min Signed: Theodore Demark 01/27/2012, 10:02 AM Co-Sign MD

## 2012-01-28 ENCOUNTER — Ambulatory Visit (HOSPITAL_COMMUNITY): Payer: Self-pay | Admitting: Psychiatry

## 2012-01-31 ENCOUNTER — Ambulatory Visit: Payer: Self-pay | Admitting: Cardiology

## 2012-02-03 ENCOUNTER — Other Ambulatory Visit (HOSPITAL_COMMUNITY): Payer: Self-pay | Admitting: Oncology

## 2012-02-03 DIAGNOSIS — K219 Gastro-esophageal reflux disease without esophagitis: Secondary | ICD-10-CM

## 2012-02-03 MED ORDER — PANTOPRAZOLE SODIUM 40 MG PO TBEC: 40.0000 mg | DELAYED_RELEASE_TABLET | Freq: Every day | ORAL | Status: DC

## 2012-02-07 ENCOUNTER — Encounter: Payer: Self-pay | Admitting: Adult Health

## 2012-02-07 ENCOUNTER — Ambulatory Visit (INDEPENDENT_AMBULATORY_CARE_PROVIDER_SITE_OTHER): Payer: BC Managed Care – PPO | Admitting: Adult Health

## 2012-02-07 VITALS — BP 108/68 | HR 62

## 2012-02-07 DIAGNOSIS — I10 Essential (primary) hypertension: Secondary | ICD-10-CM

## 2012-02-07 DIAGNOSIS — I2510 Atherosclerotic heart disease of native coronary artery without angina pectoris: Secondary | ICD-10-CM

## 2012-02-07 DIAGNOSIS — E785 Hyperlipidemia, unspecified: Secondary | ICD-10-CM

## 2012-02-07 NOTE — Progress Notes (Signed)
HPI: Mr. Atienza is a 56 year old male patient of Dr. Newburgh Bing we are following for ongoing assessment and treatment of CAD. He was last seen by Dr. Dietrich Pates in March of 2013 as known history of CAD, syncope, hyperlipidemia, hypertension, and ongoing tobacco abuse. The patient was admitted to [facility] on 01/26/2012 in the setting of unstable angina. The patient had a cardiac catheterization which was completed by Dr. Tonny Bollman revealing an ostial LAD and 30% proximal third of percent focal third percent mid to moderate size branching D1 and mild calcification and luminal irregularities. His right coronary artery was codominant and a moderate size with a proximal 99% focal stenosis the left circumflex had nonobstructive disease. The patient had PCI of the RCA using a bare-metal stent, (MultiLink vision).    Since discharge the patient states he has been feeling fatigued but otherwise without complaint. He is tolerating Plavix, aspirin, and statins. He has a history of restless leg syndrome and OSA with difficult compliance of CPAP. Myalgias are not expressed. He continues to work and be medically compliant. He continues to smoke. He is trying to cut down and is down to about 3 cigarettes a day from a 2 pack a day history.  No Known Allergies  Current Outpatient Prescriptions  Medication Sig Dispense Refill  . albuterol (PROVENTIL) (2.5 MG/3ML) 0.083% nebulizer solution Take 2.5 mg by nebulization every 6 (six) hours as needed. For shortness of breath      . albuterol-ipratropium (COMBIVENT) 18-103 MCG/ACT inhaler Inhale 2 puffs into the lungs every 6 (six) hours as needed. For shortness of breath      . ALPRAZolam (XANAX) 1 MG tablet Take 1 tablet (1 mg total) by mouth 4 (four) times daily as needed. For anxiety  120 tablet  5  . aspirin EC 81 MG tablet Take 81 mg by mouth daily.       Marland Kitchen atorvastatin (LIPITOR) 80 MG tablet 80 mg daily.       . budesonide-formoterol (SYMBICORT) 80-4.5  MCG/ACT inhaler Inhale 2 puffs into the lungs 2 (two) times daily.        . clopidogrel (PLAVIX) 75 MG tablet Take 1 tablet (75 mg total) by mouth daily.  30 tablet  11  . gabapentin (NEURONTIN) 300 MG capsule Take 300-600 mg by mouth 2 (two) times daily. Patient takes 1 capsule in the morning and 2 capsules at night      . HYDROcodone-acetaminophen (NORCO) 7.5-325 MG per tablet Take 7.5-325 mg by mouth Three times a day.      . metoprolol succinate (TOPROL-XL) 25 MG 24 hr tablet Take 25 mg by mouth at bedtime.       . nitroGLYCERIN (NITROSTAT) 0.4 MG SL tablet Place 1 tablet (0.4 mg total) under the tongue every 5 (five) minutes as needed for chest pain.  25 tablet  3  . OLANZapine (ZYPREXA) 5 MG tablet Take 5 mg by mouth at bedtime.       Marland Kitchen omeprazole (PRILOSEC) 20 MG capsule Take 20 mg by mouth daily.      Marland Kitchen rOPINIRole (REQUIP) 0.5 MG tablet Take 0.5 mg by mouth 3 (three) times daily.      . Testosterone (ANDROGEL PUMP) 1.25 GM/ACT (1%) GEL Place onto the skin 4 (four) times daily.        . verapamil (CALAN-SR) 180 MG CR tablet Take 180 mg by mouth daily.      . nicotine (NICODERM CQ) 14 mg/24hr patch Place 1 patch onto  the skin daily.  14 patch  0  . promethazine (PHENERGAN) 25 MG suppository Place 1 suppository (25 mg total) rectally every 6 (six) hours as needed for nausea.  12 each  0  . DISCONTD: amLODipine (NORVASC) 5 MG tablet Take 5 mg by mouth daily.       Marland Kitchen DISCONTD: FLUoxetine (PROZAC) 20 MG capsule Take 20 mg by mouth 2 (two) times daily. Take 2 caps at one time        Past Medical History  Diagnosis Date  . ASCVD (arteriosclerotic cardiovascular disease)     inferior MI with circumflex PTCA in 1993;cath in 2000-50% OM 1&2,30% LAD ;2008-25% LAD, 80% nondominant right ,60% small OM 2,EF of 55% with severe basilar inferior hypokinesis; stress nuclear study in 04/2008 moderate inferior scar with peri-infarction ischemic  . Syncope   . Hyperlipidemia   . Hypertension   . Tobacco  abuse     50 pack years continuing at one halp pack daily  . COPD (chronic obstructive pulmonary disease)     mild ;excercise induced hypoxemia by cp stress test ;asthma ,bronchitis,  . Restless leg syndrome   . Anxiety and depression   . GERD (gastroesophageal reflux disease)   . Inflammatory polyps of colon with rectal bleeding   . Carcinoma in situ of colon 2004    rectal polyp  . H. pylori infection 2004    treated  . Sleep apnea     does not use CPAP  . Gastritis 12/30/10    EGD Dr Jena Gauss  . Colitis, ischemic 2011  . Tubular adenoma of colon 06/2009    Colonosocpy Dr Jena Gauss  . Leukocytosis     Dr Shon Baton  . Depression   . Anxiety   . Lung nodule 07/16/2011    Past Surgical History  Procedure Date  . Colonoscopy w/ polypectomy 2004    rectal polyp with carcinoma in situ removed via colonoscopy  . Hand surgery     surgical intervention for injury of the fingers of the left hand many years ago  . Cholecystectomy 2004  . Colonoscopy 06/2009    normal terminal ileum, segmental mild inflammation of sigmoid colon (bx unremarkable), polyp, tubular adenoma  . Esophagogastroduodenoscopy 02/2009    query Barrett's but bx negative  . Esophagogastroduodenoscopy 12/30/2010    [name],gastritis, dilated 56F, sm HH, 1 small ulcer, Duodenal erosions, benign bx  . Flexible sigmoidoscopy 12/30/2010     [name],; internal hemorrhoids, anal papilla  . Heart stent   . Nasal septoplasty w/ turbinoplasty 10/06/2011    Procedure: NASAL SEPTOPLASTY WITH TURBINATE REDUCTION;  Surgeon: Serena Colonel, MD;  [facility] OR;  Service: ENT;  Laterality: Bilateral;  . Cardiac catheterization   . Coronary angioplasty with stent placement 01/26/2012    "1; total is now 2"    ZOX:WRUEAV of systems complete and found to be negative unless listed above  PHYSICAL EXAM BP 108/68  Pulse 62  General: Well developed, well nourished, in no acute distress Head: Eyes PERRLA, No xanthomas.    Normal cephalic and atramatic  Lungs: Clear bilaterally to auscultation and percussion. Heart: HRRR S1 S2, without MRG.  Pulses are 2+ & equal.            No carotid bruit. No JVD.  No abdominal bruits. No femoral bruits. Abdomen: Bowel sounds are positive, abdomen soft and non-tender without masses or  Hernia's noted. Msk:  Back normal, normal gait. Normal strength and tone for age. Extremities: No clubbing, cyanosis or edema.  DP +1. Right groin site of catheter insertion has mild bruising, no bruits, dorsalis pedis pulses are palpable. Neuro: Alert and oriented X 3. Psych:  Good affect, responds appropriately  EKG: Normal sinus rhythm rate of 62 beats per minute.:  ASSESSMENT AND PLAN

## 2012-02-07 NOTE — Assessment & Plan Note (Signed)
He is status post Multi-Link bare-metal stent to a 99% stenosis of the right coronary artery. He a good result and has been without recurrent complaints of chest pain. He will continue on Plavix for the next 30 days and aspirin daily. Secondary to complaints of fatigue I am changing his metoprolol XL to PM dose to ascertain if this will assist with his symptoms. He will see [place] again in 3 months unless he becomes symptomatic. He is advised to call his for any recurrent discomfort in his chest. He can return to work as soon as possible.

## 2012-02-07 NOTE — Assessment & Plan Note (Signed)
He continues on atorvastatin 80 mg daily we will followup in 3 months for lipids and LFTs.

## 2012-02-07 NOTE — Patient Instructions (Addendum)
Start taking metoprolol at night.  Your physician recommends that you schedule a follow-up appointment in: 3 months with Joni Reining, NP.

## 2012-02-07 NOTE — Assessment & Plan Note (Signed)
Good control of blood pressure. No changes in his medications at this time. Refills are authorized should they be necessary. I have advised him to refill his nitroglycerin every 3 months whether he has used it or not.

## 2012-02-21 ENCOUNTER — Other Ambulatory Visit (HOSPITAL_COMMUNITY): Payer: Self-pay | Admitting: Oncology

## 2012-02-21 DIAGNOSIS — K219 Gastro-esophageal reflux disease without esophagitis: Secondary | ICD-10-CM

## 2012-02-21 MED ORDER — PANTOPRAZOLE SODIUM 40 MG PO TBEC: 40.0000 mg | DELAYED_RELEASE_TABLET | Freq: Every day | ORAL | Status: DC

## 2012-02-29 ENCOUNTER — Encounter (HOSPITAL_COMMUNITY): Payer: BC Managed Care – PPO

## 2012-03-10 ENCOUNTER — Other Ambulatory Visit: Payer: Self-pay | Admitting: Cardiology

## 2012-04-10 ENCOUNTER — Encounter (HOSPITAL_COMMUNITY): Payer: BC Managed Care – PPO | Attending: Oncology

## 2012-04-10 DIAGNOSIS — D72829 Elevated white blood cell count, unspecified: Secondary | ICD-10-CM | POA: Insufficient documentation

## 2012-04-10 LAB — CBC
Hemoglobin: 16.6 g/dL (ref 13.0–17.0)
MCV: 87.5 fL (ref 78.0–100.0)
Platelets: 201 10*3/uL (ref 150–400)
RBC: 5.21 MIL/uL (ref 4.22–5.81)
WBC: 8.1 10*3/uL (ref 4.0–10.5)

## 2012-04-10 LAB — COMPREHENSIVE METABOLIC PANEL
ALT: 31 U/L (ref 0–53)
Alkaline Phosphatase: 103 U/L (ref 39–117)
CO2: 26 mEq/L (ref 19–32)
Chloride: 106 mEq/L (ref 96–112)
GFR calc Af Amer: >90 mL/min (ref >90)
GFR calc non Af Amer: >90 mL/min (ref >90)
Glucose, Bld: 105 mg/dL — ABNORMAL HIGH (ref 70–99)
Potassium: 3.6 mEq/L (ref 3.5–5.1)
Sodium: 142 mEq/L (ref 135–145)
Total Bilirubin: 0.3 mg/dL (ref 0.3–1.2)

## 2012-04-10 LAB — DIFFERENTIAL
Lymphocytes Relative: 27 % (ref 12–46)
Lymphs Abs: 2.2 10*3/uL (ref 0.7–4.0)
Neutrophils Relative %: 67 % (ref 43–77)

## 2012-04-10 NOTE — Progress Notes (Signed)
Labs drawn today for cbc/diff,cmp 

## 2012-04-14 ENCOUNTER — Ambulatory Visit (HOSPITAL_COMMUNITY)
Admission: RE | Admit: 2012-04-14 | Discharge: 2012-04-14 | Disposition: A | Discharge status: Home / Self Care | Payer: BC Managed Care – PPO | Source: Ambulatory Visit | Attending: Oncology | Admitting: Oncology

## 2012-04-14 DIAGNOSIS — I2510 Atherosclerotic heart disease of native coronary artery without angina pectoris: Secondary | ICD-10-CM | POA: Insufficient documentation

## 2012-04-14 DIAGNOSIS — R911 Solitary pulmonary nodule: Secondary | ICD-10-CM | POA: Insufficient documentation

## 2012-04-14 DIAGNOSIS — Z87891 Personal history of nicotine dependence: Secondary | ICD-10-CM | POA: Insufficient documentation

## 2012-04-14 DIAGNOSIS — R0602 Shortness of breath: Secondary | ICD-10-CM | POA: Insufficient documentation

## 2012-04-14 DIAGNOSIS — J438 Other emphysema: Secondary | ICD-10-CM | POA: Insufficient documentation

## 2012-04-15 ENCOUNTER — Other Ambulatory Visit (HOSPITAL_COMMUNITY): Payer: Self-pay | Admitting: Oncology

## 2012-04-15 DIAGNOSIS — R911 Solitary pulmonary nodule: Secondary | ICD-10-CM

## 2012-04-18 ENCOUNTER — Encounter (HOSPITAL_COMMUNITY): Payer: BC Managed Care – PPO | Attending: Oncology | Admitting: Oncology

## 2012-04-18 ENCOUNTER — Encounter (HOSPITAL_COMMUNITY): Payer: Self-pay | Admitting: Oncology

## 2012-04-18 VITALS — BP 147/93 | HR 83 | Temp 97.2°F | Resp 18 | Wt 173.1 lb

## 2012-04-18 DIAGNOSIS — R911 Solitary pulmonary nodule: Secondary | ICD-10-CM

## 2012-04-18 DIAGNOSIS — D72829 Elevated white blood cell count, unspecified: Secondary | ICD-10-CM

## 2012-04-18 DIAGNOSIS — I10 Essential (primary) hypertension: Secondary | ICD-10-CM

## 2012-04-18 NOTE — Patient Instructions (Addendum)
[facility]  Discharge Instructions  RECOMMENDATIONS MADE BY THE CONSULTANT AND ANY TEST RESULTS WILL BE SENT TO YOUR REFERRING DOCTOR.   Please try to QUIT SMOKING. There are smoking cessation classes available here at [facility] if you are interested. We encourage you to use your CPAP at night. Return to this office in 6 months for lab work and then MD appointment a few days later. We will also schedule lab work in 12 months with a CT scan and then MD appointment to follow. Continue to follow-up with your Cardiologist. Report any issues/concerns as needed to this clinic prior to your appointments.   I acknowledge that I have been informed and understand all the instructions given to me and received a copy. I do not have any more questions at this time, but understand that I may call the Specialty Clinic at [facility] at [phone] during business hours should I have any further questions or need assistance in obtaining follow-up care.    __________________________________________  _____________  __________ Signature of Patient or Authorized Representative            Date                   Time    __________________________________________ Nurse's Signature

## 2012-04-18 NOTE — Progress Notes (Signed)
[name] Punt, MD [address] [place] [address]  1. Leukocytosis   2. Lung nodule     CURRENT THERAPY: Observation  INTERVAL HISTORY: [name] 56 y.o. male returns for  regular  visit for followup of leukocytosis which is within normal limits and stable within the past year and pulmonary nodules which are stable.  I personally reviewed and went over laboratory results with the patient. The patient's white blood cell count has been within normal limits dating back to January of 2013. Most recently, his white blood cell count is noted to be 8.1 on 04/10/2012 with a unremarkable differential. His hemoglobin is stable at around 16 g/dL. His platelet count is within normal limits it well at 201,000.  I personally reviewed and went over radiographic studies with the patient. The patient's CT of chest without contrast performed on 04/14/2012 for followup of lung nodules and his high-risk for bronchogenic carcinoma secondary to smoking history reveals no new nodules or airspace opacities, no pleural fluid, instability of left lower lobe lung nodules.  Emphysema is appreciated. It is recommended for a CT in October 2014 to confirm 2 years of stability of the lung nodules. This order has been placed.  In September, the patient was admitted for unstable angina pain. He is status post a bare-metal stent to the RCA on 01/26/2012. He is recovering nicely.   [name] is seen for followup today. As mentioned above we reviewed his CT scan in addition to his laboratory work. He complains of being "hired and run down". He has decreased energy. This is likely multifactorial to do his sleep apnea and he does not wear a CPAP machine. He has trouble sleeping.  He only sleeps approximately 3 hours a day and is also probably attributes to his tiredness and fatigue.  He was encouraged to wear a CPAP machine as directed.  The patient continues to smoke cigarettes. He smokes approximately 1 pack every 3-4  days. He did try to utilize a NicoDerm patch but he quit due to side effects.  The patient continues to have shortness of breath that is significant. As likely secondary to his lung disease. I've encouraged him to follow with primary care doctor and/or a pulmonologist.  His blood pressure was appreciated to be 147/93 today. This too was likely multifactorial. He likely has hypertension as probably affected with his sleep apnea. I will defer this to his cardiologist which the patient reports his Dr. Dietrich Pates.  [name] also admits to signs and symptoms of orthostatic hypotension. He reports that he fell 1 time after going from a sitting position to standing position. I provided patient with education regarding orthostatic hypotension. I've encouraged him to increase his water consumption as well help with this symptom.  Oncologically in hematologically, the patient's review of systems is completely negative.   Past Medical History  Diagnosis Date  . ASCVD (arteriosclerotic cardiovascular disease)     inferior MI with circumflex PTCA in 1993;cath in 2000-50% OM 1&2,30% LAD ;2008-25% LAD, 80% nondominant right ,60% small OM 2,EF of 55% with severe basilar inferior hypokinesis; stress nuclear study in 04/2008 moderate inferior scar with peri-infarction ischemic  . Syncope   . Hyperlipidemia   . Hypertension   . Tobacco abuse     50 pack years continuing at one halp pack daily  . COPD (chronic obstructive pulmonary disease)     mild ;excercise induced hypoxemia by cp stress test ;asthma ,bronchitis,  . Restless leg syndrome   .  Anxiety and depression   . GERD (gastroesophageal reflux disease)   . Inflammatory polyps of colon with rectal bleeding   . Carcinoma in situ of colon 2004    rectal polyp  . H. pylori infection 2004    treated  . Sleep apnea     does not use CPAP  . Gastritis 12/30/10    EGD Dr Jena Gauss  . Colitis, ischemic 2011  . Tubular adenoma of colon 06/2009    Colonosocpy  Dr Jena Gauss  . Leukocytosis     Dr Shon Baton  . Depression   . Anxiety   . Lung nodule 07/16/2011    has Hyperlipidemia; ANXIETY; TOBACCO ABUSE; DEPRESSION; OBSTRUCTIVE SLEEP APNEA; HYPERTENSION; ATHEROSCLEROTIC CARDIOVASCULAR DISEASE; COPD; Anorexia; Leukocytosis; RLS (restless legs syndrome); Lung nodule; and Unstable angina on his problem list.      has no known allergies.  [name] does not currently have medications on file.  Past Surgical History  Procedure Date  . Colonoscopy w/ polypectomy 2004    rectal polyp with carcinoma in situ removed via colonoscopy  . Hand surgery     surgical intervention for injury of the fingers of the left hand many years ago  . Cholecystectomy 2004  . Colonoscopy 06/2009    normal terminal ileum, segmental mild inflammation of sigmoid colon (bx unremarkable), polyp, tubular adenoma  . Esophagogastroduodenoscopy 02/2009    query Barrett's but bx negative  . Esophagogastroduodenoscopy 12/30/2010    [name],gastritis, dilated 39F, sm HH, 1 small ulcer, Duodenal erosions, benign bx  . Flexible sigmoidoscopy 12/30/2010     [name],; internal hemorrhoids, anal papilla  . Heart stent   . Nasal septoplasty w/ turbinoplasty 10/06/2011    Procedure: NASAL SEPTOPLASTY WITH TURBINATE REDUCTION;  Surgeon: Serena Colonel, MD;  Location: [facility] OR;  Service: ENT;  Laterality: Bilateral;  . Cardiac catheterization   . Coronary angioplasty with stent placement 01/26/2012    "1; total is now 2"    Denies any headaches, dizziness, double vision, fevers, chills, night sweats, nausea, vomiting, diarrhea, constipation, chest pain, heart palpitations, blood in stool, black tarry stool, urinary pain, urinary burning, urinary frequency, hematuria.   PHYSICAL EXAMINATION  ECOG PERFORMANCE STATUS: 1 - Symptomatic but completely ambulatory  There were no vitals filed for this visit.  GENERAL:alert, no distress, comfortable, cooperative, smiling and  chronically ill-appearing. SKIN: skin color, texture, turgor are normal, no rashes or significant lesions HEAD: Normocephalic, No masses, lesions, tenderness or abnormalities EYES: normal, Conjunctiva are pink and non-injected EARS: External ears normal OROPHARYNX:lips, buccal mucosa, and tongue normal and mucous membranes are moist  NECK: supple, no adenopathy, trachea midline LYMPH:  no palpable lymphadenopathy BREAST:not examined LUNGS: clear to auscultation and percussion, decreased breath sounds HEART: regular rate & rhythm, no murmurs, no gallops, S1 normal and S2 normal ABDOMEN:abdomen soft, non-tender and normal bowel sounds BACK: Back symmetric, no curvature., No CVA tenderness EXTREMITIES:less then 2 second capillary refill, no joint deformities, effusion, or inflammation, no edema, no skin discoloration  NEURO: alert & oriented x 3 with fluent speech, no focal motor/sensory deficits, gait normal   LABORATORY DATA: CBC    Component Value Date/Time   WBC 8.1 04/10/2012 0933   RBC 5.21 04/10/2012 0933   HGB 16.6 04/10/2012 0933   HCT 45.6 04/10/2012 0933   PLT 201 04/10/2012 0933   MCV 87.5 04/10/2012 0933   MCH 31.9 04/10/2012 0933   MCHC 36.4* 04/10/2012 0933   RDW 12.9 04/10/2012 0933   LYMPHSABS 2.2  04/10/2012 0933   MONOABS 0.4 04/10/2012 0933   EOSABS 0.1 04/10/2012 0933   BASOSABS 0.0 04/10/2012 0933     RADIOGRAPHIC STUDIES:  04/14/2012 *RADIOLOGY REPORT*  Clinical Data: Surveillance of lung nodule. High risk for  bronchogenic carcinoma secondary to smoking history. Shortness of  breath. Recent cardiac stent placement.  CT CHEST WITHOUT CONTRAST  Technique: Multidetector CT imaging of the chest was performed  following the standard protocol without IV contrast.  Comparison: Plain film of 01/26/2012. CT of 07/16/2011 and  03/17/2011.  Findings: Lungs/pleura: Moderate centrilobular emphysema.  Paraseptal emphysema at the apices.  Mild volume  loss/scarring in the inferior right middle lobe.  A subpleural left lower lobe lung nodule measures 6 mm on image 40  and is unchanged.  A nodule in the superior segment left lower lobe measures 7 by 6 mm  on image 25 versus 7 x 7 mm on the prior exam (when remeasured).  When compared back to the exam of 03/17/2011, this appears similar  (8 x 7 mm on that exam).  No new nodules or airspace opacities.  No pleural fluid.  Heart/Mediastinum: Normal heart size. Trace pericardial fluid  thickening anteriorly is unchanged. Multivessel coronary artery  atherosclerosis. Small middle mediastinal nodes,  Upper abdomen: Cholecystectomy. No significant findings.  Bones/Musculoskeletal: No acute osseous abnormality.  IMPRESSION:  1. Similar appearance of 2 left lower lobe lung nodules since  07/26/2011. Also similar back to 03/17/2011. Recommend follow-up  at approximately October 2014 to confirm 2 years of stability. This  recommendation follows the consensus statement: Guidelines for  Management of Small Pulmonary Nodules Detected on CT Scans: A  Statement from the Fleischner Society as published in Radiology  2005; 237:395-400.  2. Moderate centrilobular and mild paraseptal emphysema.  3. Age advanced coronary artery disease.  Original Report Authenticated By: Jeronimo Greaves, M.D.     ASSESSMENT:  1. Erythrocytosis, secondary to lung disease, within normal limits most recently x 1 year 2. Leukocytosis, secondary to IBS and/or lung disease, within normal limits most recently x 1 year 3. COPD  4. IBS  5. Diarrhea, followed by GI  6. Dysphagia, followed by GI  7. Tobacco abuse 30+ pack years, currently smoking up to 3 cigarettes weekly. Began smoking at the age of 43  8. Shortness of breath     PLAN:  1. I personally reviewed and went over laboratory results with the patient. 2. I personally reviewed and went over radiographic studies with the patient. 3. CT scan of chest without contrast  in October/November 2014 for surveillance of lung nodules. 4. Lab work in 6 months: CBC diff, CMET 5. Recommend follow-up with Cardiologist as scheduled.  6. Smoking cessation education provided.  7. Recommend CPAP usage at HS.  8. Recommend follow-up with PCP as directed. 9. Recommend follow-up with pulmonologist.  10. Return in 6 months for follow-up.    All questions were answered. The patient knows to call the clinic with any problems, questions or concerns. We can certainly see the patient much sooner if necessary.  The patient and plan discussed with Glenford Peers, MD and he is in agreement with the aforementioned.  KEFALAS,THOMAS

## 2012-05-02 ENCOUNTER — Other Ambulatory Visit: Payer: Self-pay | Admitting: Cardiology

## 2012-05-09 ENCOUNTER — Encounter: Payer: Self-pay | Admitting: *Deleted

## 2012-05-09 ENCOUNTER — Ambulatory Visit (INDEPENDENT_AMBULATORY_CARE_PROVIDER_SITE_OTHER): Payer: BC Managed Care – PPO | Admitting: Cardiology

## 2012-05-09 ENCOUNTER — Encounter: Payer: Self-pay | Admitting: Cardiology

## 2012-05-09 VITALS — BP 121/78 | HR 84 | Ht 70.0 in | Wt 174.0 lb

## 2012-05-09 DIAGNOSIS — J449 Chronic obstructive pulmonary disease, unspecified: Secondary | ICD-10-CM

## 2012-05-09 DIAGNOSIS — R0602 Shortness of breath: Secondary | ICD-10-CM

## 2012-05-09 DIAGNOSIS — F17200 Nicotine dependence, unspecified, uncomplicated: Secondary | ICD-10-CM

## 2012-05-09 DIAGNOSIS — E785 Hyperlipidemia, unspecified: Secondary | ICD-10-CM

## 2012-05-09 DIAGNOSIS — I10 Essential (primary) hypertension: Secondary | ICD-10-CM

## 2012-05-09 DIAGNOSIS — I2510 Atherosclerotic heart disease of native coronary artery without angina pectoris: Secondary | ICD-10-CM

## 2012-05-09 DIAGNOSIS — K219 Gastro-esophageal reflux disease without esophagitis: Secondary | ICD-10-CM

## 2012-05-09 NOTE — Patient Instructions (Signed)
Your physician recommends that you schedule a follow-up appointment in:  1 - 2 months with Dr Dietrich Pates 2 - We will make you an appointment to return to office with all bottles of medications you are taking  Your physician has recommended that you have a pulmonary function test. Pulmonary Function Tests are a group of tests that measure how well air moves in and out of your lungs.  Your physician has recommended you make the following change in your medication:  1 - Over the counter Nicotine gum 2 - STOP Plavix (clopidogril)  We will enroll you into a smoking cessation program thru the [facility] Department of [employer].  They will call you with class times.

## 2012-05-09 NOTE — Progress Notes (Signed)
Patient ID: [name], male   DOB: 07/23/1955, 56 y.o.   MRN: [id number]  HPI: Scheduled return visit for this nice gentleman with coronary artery disease and multiple cardiovascular risk factors.  Since his last visit, he has done fairly well with occasional episodes of chest discomfort they resolve spontaneously or with nitroglycerin.  He has chronic dyspnea on exertion that has been stable.  He reports significant chronic obstructive pulmonary disease and has been followed in the past by Dr. Maple Hudson.  He notes reduction in tobacco consumption, but continues to smoke approximately 1/4 pack per day.  He has stopped smoking in the past with the assistance of transdermal nicotine, but experienced chest discomfort when he placed a nicotine patch recently.  He is an inactive physically, but does some light housework without too much difficulty.  Prior to Admission medications   Medication Sig Start Date End Date Taking? Authorizing Provider  albuterol (PROVENTIL) (2.5 MG/3ML) 0.083% nebulizer solution Take 2.5 mg by nebulization every 6 (six) hours as needed. For shortness of breath   Yes Historical Provider, MD  albuterol-ipratropium (COMBIVENT) 18-103 MCG/ACT inhaler Inhale 2 puffs into the lungs every 6 (six) hours as needed. For shortness of breath   Yes Historical Provider, MD  ALPRAZolam (XANAX) 1 MG tablet Take 1 tablet (1 mg total) by mouth 4 (four) times daily as needed. For anxiety 10/22/11  Yes Dorene Ar, MD  aspirin EC 81 MG tablet Take 81 mg by mouth daily.    Yes Historical Provider, MD  atorvastatin (LIPITOR) 80 MG tablet 80 mg daily.  02/02/12  Yes Historical Provider, MD  atorvastatin (LIPITOR) 80 MG tablet TAKE 1 TABLET BY MOUTH EVERY DAY 05/02/12  Yes Kathlen Brunswick, MD  budesonide-formoterol [facility]) 80-4.5 MCG/ACT inhaler Inhale 2 puffs into the lungs 2 (two) times daily.     Yes Historical Provider, MD  gabapentin (NEURONTIN) 300 MG capsule Take 300-600 mg by mouth 2 (two)  times daily. Patient takes 1 capsule in the morning and 2 capsules at night   Yes Dorene Ar, MD  HYDROcodone-acetaminophen [facility]) 7.5-325 MG per tablet Take 7.5-325 mg by mouth Three times a day. 01/03/12  Yes Historical Provider, MD  metoprolol succinate (TOPROL-XL) 25 MG 24 hr tablet TAKE 1 TABLET BY MOUTH EVERY DAY 03/10/12  Yes Kathlen Brunswick, MD  nitroGLYCERIN (NITROSTAT) 0.4 MG SL tablet Place 1 tablet (0.4 mg total) under the tongue every 5 (five) minutes as needed for chest pain. 01/27/12 01/26/13 Yes Rhonda G Barrett, PA  OLANZapine (ZYPREXA) 5 MG tablet Take 5 mg by mouth at bedtime.    Yes Historical Provider, MD  omeprazole (PRILOSEC) 20 MG capsule Take 20 mg by mouth daily.   Yes Historical Provider, MD  pravastatin (PRAVACHOL) 40 MG tablet Take 40 mg by mouth daily.  04/15/12  Yes Historical Provider, MD  promethazine (PHENERGAN) 25 MG suppository Place 25 mg rectally every 6 (six) hours as needed. 10/06/11  Yes Serena Colonel, MD  rOPINIRole (REQUIP) 0.5 MG tablet Take 0.5 mg by mouth 3 (three) times daily.   Yes Historical Provider, MD  Testosterone (ANDROGEL PUMP) 1.25 GM/ACT (1%) GEL Place onto the skin 4 (four) times daily.     Yes Historical Provider, MD  verapamil (CALAN-SR) 180 MG CR tablet Take 180 mg by mouth daily.   Yes Kathlen Brunswick, MD  vitamin B-12 (CYANOCOBALAMIN) 1000 MCG tablet Take 2,000 mcg by mouth daily.   Yes Historical Provider, MD  No Known  Allergies    Past medical history, social history, and family history reviewed and updated.  ROS: Denies orthopnea, PND, lightheadedness or syncope.  Reports dysphagia in the upper neck.  Prior esophageal dilatation-maintained on PPI since.  All other systems reviewed and are negative.  PHYSICAL EXAM: BP 121/78  Pulse 84  Ht 5\' 10"  (1.778 m)  Wt 78.926 kg (174 lb)  BMI 24.97 kg/m2  SpO2 96%  General-Well developed; no acute distress Body habitus-proportionate weight and height Neck-No JVD; no carotid  bruits Lungs-clear lung fields; resonant to percussion Cardiovascular-normal PMI; normal S1 and S2 Abdomen-normal bowel sounds; soft and non-tender without masses or organomegaly Musculoskeletal-No deformities, no cyanosis or clubbing Neurologic-Normal cranial nerves; symmetric strength and tone Skin-Warm, no significant lesions Extremities-distal pulses intact; no edema  EKG: normal sinus rhythm, nondiagnostic inferior Q waves, right ventricular conduction delay, borderline left atrial abnormality, abnormal R wave progression.  No significant change when compared to previous tracing performed 02/07/12.  ASSESSMENT AND PLAN:  [place] Bing, MD 05/09/2012 9:23 PM

## 2012-05-09 NOTE — Assessment & Plan Note (Signed)
Complete discontinuation of tobacco use strongly encouraged.  We provided teaching and recommended use of nicotine gum.

## 2012-05-09 NOTE — Assessment & Plan Note (Signed)
Lipid profile was good when last assessed less than one year ago.  We will verify current lipid-lowering therapy and likely continue it unchanged.

## 2012-05-09 NOTE — Assessment & Plan Note (Signed)
Generally well controlled with occasional systolics in the 140-150 range.  We will verify current antihypertensive regimen and alter as necessary.

## 2012-05-09 NOTE — Assessment & Plan Note (Addendum)
Patient describes dysphagia, which, by his description, may be related to a swallowing disorder or anatomical problem with the upper esophagus.  Reconsultation with Dr. Jena Gauss advised.

## 2012-05-09 NOTE — Assessment & Plan Note (Signed)
The patient has not received specialist care for some years nor has he undergone any specific testing.  Pulmonary function tests will be obtained.

## 2012-05-09 NOTE — Assessment & Plan Note (Addendum)
Dyspnea may be related to chronic lung disease.  Currently he has no symptoms that clearly indicate the presence of myocardial ischemia.  We will continue to attempt to optimally control cardiovascular risk factors.  He is 3 months out from placement of a bare-metal stent and should have discontinue clopidogrel 8 weeks ago; however, we're not entirely clear what medications he is taking.  He is asked to bring all medication vials to the office tomorrow for review and reassessment.

## 2012-05-09 NOTE — Progress Notes (Deleted)
Name: [name]    DOB: 03/19/1956  Age: 56 y.o.  MR#: [id number]       PCP:  [name], MD      Insurance: @PAYORNAME @   CC:   No chief complaint on file.  DAILY CHEST PAIN AND SOB, ASSOC WITH DIZZINESS ORTHOSTATICS AND EKG DONE TODAY LABS 10/13  VS BP 121/78  Pulse 84  Ht 5\' 10"  (1.778 m)  Wt 174 lb (78.926 kg)  BMI 24.97 kg/m2  SpO2 96%  Weights Current Weight  05/09/12 174 lb (78.926 kg)  04/18/12 173 lb 1.6 oz (78.518 kg)  01/27/12 186 lb 4.6 oz (84.5 kg)    Blood Pressure  BP Readings from Last 3 Encounters:  05/09/12 121/78  04/18/12 147/93  02/07/12 108/68     Admit date:  (Not on file) Last encounter with RMR:  05/02/2012   Allergy No Known Allergies  Current Outpatient Prescriptions  Medication Sig Dispense Refill  . albuterol (PROVENTIL) (2.5 MG/3ML) 0.083% nebulizer solution Take 2.5 mg by nebulization every 6 (six) hours as needed. For shortness of breath      . albuterol-ipratropium (COMBIVENT) 18-103 MCG/ACT inhaler Inhale 2 puffs into the lungs every 6 (six) hours as needed. For shortness of breath      . ALPRAZolam (XANAX) 1 MG tablet Take 1 tablet (1 mg total) by mouth 4 (four) times daily as needed. For anxiety  120 tablet  5  . aspirin EC 81 MG tablet Take 81 mg by mouth daily.       Marland Kitchen atorvastatin (LIPITOR) 80 MG tablet 80 mg daily.       Marland Kitchen atorvastatin (LIPITOR) 80 MG tablet TAKE 1 TABLET BY MOUTH EVERY DAY  90 tablet  3  . budesonide-formoterol (SYMBICORT) 80-4.5 MCG/ACT inhaler Inhale 2 puffs into the lungs 2 (two) times daily.        . clopidogrel (PLAVIX) 75 MG tablet Take 1 tablet (75 mg total) by mouth daily.  30 tablet  11  . gabapentin (NEURONTIN) 300 MG capsule Take 300-600 mg by mouth 2 (two) times daily. Patient takes 1 capsule in the morning and 2 capsules at night      . HYDROcodone-acetaminophen (NORCO) 7.5-325 MG per tablet Take 7.5-325 mg by mouth Three times a day.      . metoprolol succinate (TOPROL-XL) 25 MG 24 hr tablet  TAKE 1 TABLET BY MOUTH EVERY DAY  30 tablet  6  . nitroGLYCERIN (NITROSTAT) 0.4 MG SL tablet Place 1 tablet (0.4 mg total) under the tongue every 5 (five) minutes as needed for chest pain.  25 tablet  3  . OLANZapine (ZYPREXA) 5 MG tablet Take 5 mg by mouth at bedtime.       Marland Kitchen omeprazole (PRILOSEC) 20 MG capsule Take 20 mg by mouth daily.      . pravastatin (PRAVACHOL) 40 MG tablet Take 40 mg by mouth daily.       . promethazine (PHENERGAN) 25 MG suppository Place 25 mg rectally every 6 (six) hours as needed.      Marland Kitchen rOPINIRole (REQUIP) 0.5 MG tablet Take 0.5 mg by mouth 3 (three) times daily.      . Testosterone (ANDROGEL PUMP) 1.25 GM/ACT (1%) GEL Place onto the skin 4 (four) times daily.        . verapamil (CALAN-SR) 180 MG CR tablet Take 180 mg by mouth daily.      . vitamin B-12 (CYANOCOBALAMIN) 1000 MCG tablet Take 2,000 mcg by mouth daily.      . [  DISCONTINUED] promethazine (PHENERGAN) 25 MG suppository Place 1 suppository (25 mg total) rectally every 6 (six) hours as needed for nausea.  12 each  0  . [DISCONTINUED] amLODipine (NORVASC) 5 MG tablet Take 5 mg by mouth daily.       . [DISCONTINUED] FLUoxetine (PROZAC) 20 MG capsule Take 20 mg by mouth 2 (two) times daily. Take 2 caps at one time        Discontinued Meds:    Medications Discontinued During This Encounter  Medication Reason  . promethazine (PHENERGAN) 25 MG suppository     Patient Active Problem List  Diagnosis  . Hyperlipidemia  . ANXIETY  . TOBACCO ABUSE  . DEPRESSION  . OBSTRUCTIVE SLEEP APNEA  . HYPERTENSION  . ATHEROSCLEROTIC CARDIOVASCULAR DISEASE  . COPD  . Anorexia  . Leukocytosis  . RLS (restless legs syndrome)  . Lung nodule  . Unstable angina    LABS Infusion on 04/10/2012  Component Date Value  . WBC 04/10/2012 8.1   . RBC 04/10/2012 5.21   . Hemoglobin 04/10/2012 16.6   . HCT 04/10/2012 45.6   . MCV 04/10/2012 87.5   . [facility] 04/10/2012 31.9   . MCHC 04/10/2012 36.4*  . RDW 04/10/2012 12.9    . Platelets 04/10/2012 201   . Neutrophils Relative 04/10/2012 67   . Neutro Abs 04/10/2012 5.5   . Lymphocytes Relative 04/10/2012 27   . Lymphs Abs 04/10/2012 2.2   . Monocytes Relative 04/10/2012 5   . Monocytes Absolute 04/10/2012 0.4   . Eosinophils Relative 04/10/2012 1   . Eosinophils Absolute 04/10/2012 0.1   . Basophils Relative 04/10/2012 0   . Basophils Absolute 04/10/2012 0.0   . Sodium 04/10/2012 142   . Potassium 04/10/2012 3.6   . Chloride 04/10/2012 106   . CO2 04/10/2012 26   . Glucose, Bld 04/10/2012 105*  . BUN 04/10/2012 9   . Creatinine, Ser 04/10/2012 0.78   . Calcium 04/10/2012 9.2   . Total Protein 04/10/2012 6.8   . Albumin 04/10/2012 3.8   . AST 04/10/2012 16   . ALT 04/10/2012 31   . Alkaline Phosphatase 04/10/2012 103   . Total Bilirubin 04/10/2012 0.3   . GFR calc non Af Amer 04/10/2012 >90   . GFR calc Af Amer 04/10/2012 >90      Results for this Opt Visit:     Results for orders placed in visit on 04/10/12  CBC      Component Value Range   WBC 8.1  4.0 - 10.5 K/uL   RBC 5.21  4.22 - 5.81 MIL/uL   Hemoglobin 16.6  13.0 - 17.0 g/dL   HCT 16.1  09.6 - 04.5 %   MCV 87.5  78.0 - 100.0 fL   MCH 31.9  26.0 - 34.0 pg   MCHC 36.4 (*) 30.0 - 36.0 g/dL   RDW 40.9  81.1 - 91.4 %   Platelets 201  150 - 400 K/uL  DIFFERENTIAL      Component Value Range   Neutrophils Relative 67  43 - 77 %   Neutro Abs 5.5  1.7 - 7.7 K/uL   Lymphocytes Relative 27  12 - 46 %   Lymphs Abs 2.2  0.7 - 4.0 K/uL   Monocytes Relative 5  3 - 12 %   Monocytes Absolute 0.4  0.1 - 1.0 K/uL   Eosinophils Relative 1  0 - 5 %   Eosinophils Absolute 0.1  0.0 - 0.7 K/uL  Basophils Relative 0  0 - 1 %   Basophils Absolute 0.0  0.0 - 0.1 K/uL  COMPREHENSIVE METABOLIC PANEL      Component Value Range   Sodium 142  135 - 145 mEq/L   Potassium 3.6  3.5 - 5.1 mEq/L   Chloride 106  96 - 112 mEq/L   CO2 26  19 - 32 mEq/L   Glucose, Bld 105 (*) 70 - 99 mg/dL   BUN 9  6 - 23  mg/dL   Creatinine, Ser 1.61  0.50 - 1.35 mg/dL   Calcium 9.2  8.4 - 09.6 mg/dL   Total Protein 6.8  6.0 - 8.3 g/dL   Albumin 3.8  3.5 - 5.2 g/dL   AST 16  0 - 37 U/L   ALT 31  0 - 53 U/L   Alkaline Phosphatase 103  39 - 117 U/L   Total Bilirubin 0.3  0.3 - 1.2 mg/dL   GFR calc non Af Amer >90  >90 mL/min   GFR calc Af Amer >90  >90 mL/min    EKG Orders placed in visit on 05/09/12  . EKG 12-LEAD     Prior Assessment and Plan Problem List as of 05/09/2012            Cardiology Problems   Hyperlipidemia   Last Assessment & Plan Note   02/07/2012 Office Visit Signed 02/07/2012 12:58 PM by Jodelle Gross, NP    He continues on atorvastatin 80 mg daily we will followup in 3 months for lipids and LFTs.    HYPERTENSION   Last Assessment & Plan Note   02/07/2012 Office Visit Signed 02/07/2012 12:57 PM by Jodelle Gross, NP    Good control of blood pressure. No changes in his medications at this time. Refills are authorized should they be necessary. I have advised him to refill his nitroglycerin every 3 months whether he has used it or not.    ATHEROSCLEROTIC CARDIOVASCULAR DISEASE   Last Assessment & Plan Note   02/07/2012 Office Visit Signed 02/07/2012 12:57 PM by Jodelle Gross, NP    He is status post Multi-Link bare-metal stent to a 99% stenosis of the right coronary artery. He a good result and has been without recurrent complaints of chest pain. He will continue on Plavix for the next 30 days and aspirin daily. Secondary to complaints of fatigue I am changing his metoprolol XL to PM dose to ascertain if this will assist with his symptoms. He will see [place] again in 3 months unless he becomes symptomatic. He is advised to call his for any recurrent discomfort in his chest. He can return to work as soon as possible.    Unstable angina     Other   ANXIETY   TOBACCO ABUSE   Last Assessment & Plan Note   08/31/2011 Office Visit Signed 08/31/2011  2:30 PM by Kathlen Brunswick, MD     Patient encouraged to discontinue cigarette smoking entirely.    DEPRESSION   OBSTRUCTIVE SLEEP APNEA   Last Assessment & Plan Note   03/09/2011 Office Visit Signed 03/09/2011  5:27 PM by Barbaraann Share, MD    The patient has a history of very mild sleep apnea, however I am not sure if this has anything to do with his daytime sleepiness.  He is on large numbers of sedating medications, and by history he may also have the restless leg syndrome.  He clearly is not going to tolerate CPAP,  and I have discussed with him the other treatment options for his mild sleep apnea.  These include upper airway surgery, and also dental appliance.  I do not think he will tolerate an appliance given his nasal obstruction, but would consider the surgical option.  I will refer him to otolaryngology in the [place] area for evaluation.  Again, it is unclear how much this is contributing to his overall sleepiness.     COPD   Last Assessment & Plan Note   08/31/2011 Office Visit Signed 08/31/2011  2:29 PM by Kathlen Brunswick, MD    Patient is encouraged to discontinue cigarette smoking entirely.    Anorexia   Last Assessment & Plan Note   08/31/2011 Office Visit Signed 08/31/2011  2:22 PM by Kathlen Brunswick, MD    Patient reports that he has been eating well, and he has gained 11 pounds since his most recent office visit.  Anorexia does not appear to be a current problem.    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 GI workup as outlined above.    RLS (restless legs syndrome)   Last Assessment & Plan Note   03/09/2011 Office Visit Signed 03/09/2011  5:28 PM by Barbaraann Share, MD    The pt has a history that is suggestive of RLS, and I think we should give him a trial of a dopamine agonist.  He will give me feedback in a few weeks with his response.     Lung nodule   Last Assessment & Plan  Note   08/31/2011 Office Visit Signed 08/31/2011  2:17 PM by Kathlen Brunswick, MD    03/2011-small nodule in the left lower lobe, 7.5 x 6.6 mm.  Previously 5.4 x 4.5 mm.   Pulmonary neoplasm cannot be excluded.  Bullous emphysema.; continued CT surveillance         Imaging: Ct Chest Wo Contrast  04/14/2012  *RADIOLOGY REPORT*  Clinical Data: Surveillance of lung nodule.  High risk for bronchogenic carcinoma secondary to smoking history.  Shortness of breath.  Recent cardiac stent placement.  CT CHEST WITHOUT CONTRAST  Technique:  Multidetector CT imaging of the chest was performed following the standard protocol without IV contrast.  Comparison: Plain film of 01/26/2012.  CT of 07/16/2011 and 03/17/2011.  Findings: Lungs/pleura: Moderate centrilobular emphysema. Paraseptal emphysema at the apices. Mild volume loss/scarring in the inferior right middle lobe. A subpleural left lower lobe lung nodule measures 6 mm on image 40 and is unchanged. A nodule in the superior segment left lower lobe measures 7 by 6 mm on image 25 versus 7 x 7 mm on the prior exam (when remeasured). When compared back to the exam of 03/17/2011, this appears similar (8 x 7 mm on that exam).  No new nodules or airspace opacities.  No pleural fluid.  Heart/Mediastinum: Normal heart size.  Trace pericardial fluid thickening anteriorly is unchanged. Multivessel coronary artery atherosclerosis.  Small middle mediastinal nodes,  Upper abdomen: Cholecystectomy. No significant findings.  Bones/Musculoskeletal:  No acute osseous abnormality.  IMPRESSION:  1.  Similar appearance of 2 left lower lobe lung nodules since 07/26/2011.  Also similar back to 03/17/2011.  Recommend follow-up at approximately October 2014 to confirm 2 years of stability. This recommendation follows the consensus statement: Guidelines for Management of Small Pulmonary Nodules Detected on CT Scans:  A  Statement from the Fleischner Society as published in Radiology 2005;  237:395-400. 2.  Moderate centrilobular and mild paraseptal emphysema. 3.  Age advanced coronary artery disease.   Original Report Authenticated By: Jeronimo Greaves, M.D.      [facility] Calculation: Score not calculated. Missing: Total Cholesterol

## 2012-05-17 ENCOUNTER — Encounter: Payer: BC Managed Care – PPO | Admitting: *Deleted

## 2012-05-17 ENCOUNTER — Ambulatory Visit (HOSPITAL_COMMUNITY)
Admission: RE | Admit: 2012-05-17 | Discharge: 2012-05-17 | Disposition: A | Discharge status: Home / Self Care | Payer: BC Managed Care – PPO | Source: Ambulatory Visit | Attending: Cardiology | Admitting: Cardiology

## 2012-05-17 DIAGNOSIS — I2510 Atherosclerotic heart disease of native coronary artery without angina pectoris: Secondary | ICD-10-CM

## 2012-05-17 DIAGNOSIS — R0602 Shortness of breath: Secondary | ICD-10-CM | POA: Insufficient documentation

## 2012-05-17 LAB — BLOOD GAS, ARTERIAL
Bicarbonate: 22.8 mEq/L (ref 20.0–24.0)
TCO2: 19.3 mmol/L (ref 0–100)
pCO2 arterial: 38.9 mmHg (ref 35.0–45.0)
pH, Arterial: 7.385 (ref 7.350–7.450)

## 2012-05-17 MED ORDER — ALBUTEROL SULFATE (5 MG/ML) 0.5% IN NEBU
2.5000 mg | INHALATION_SOLUTION | Freq: Once | RESPIRATORY_TRACT | Status: AC
  Administered 2012-05-17: 2.5 mg via RESPIRATORY_TRACT

## 2012-05-19 NOTE — Procedures (Signed)
NAMEHARVY, RIERA                ACCOUNT NO.:  [id number]  MEDICAL RECORD NO.:  [id number]  LOCATION:                                 FACILITY:  PHYSICIAN:  Pavan Bring L. Juanetta Gosling, M.D.DATE OF BIRTH:  08/20/1955  DATE OF PROCEDURE:  05/18/2012 DATE OF DISCHARGE:                           PULMONARY FUNCTION TEST   REASON FOR PULMONARY FUNCTION TESTING:  Shortness of breath.  1. Spirometry shows no ventilatory defect, but does show evidence of     airflow obstruction. 2. Lung volumes are normal. 3. DLCO is moderately reduced, but corrects when ventilation is taken     into account to a mild defect. 4. Airway resistance is high confirming the existence of airflow     obstruction. 5. There is no significant bronchodilator improvement. 6. This study is consistent with COPD.     Ashur Glatfelter L. Juanetta Gosling, M.D.     ELH/MEDQ  D:  05/18/2012  T:  05/19/2012  Job:  [id number]  cc:   Gerrit Friends. Dietrich Pates, MD, [facility]

## 2012-06-05 ENCOUNTER — Encounter (HOSPITAL_COMMUNITY): Payer: Self-pay | Admitting: Psychiatry

## 2012-06-05 ENCOUNTER — Ambulatory Visit (INDEPENDENT_AMBULATORY_CARE_PROVIDER_SITE_OTHER): Payer: BC Managed Care – PPO | Admitting: Psychiatry

## 2012-06-05 VITALS — Wt 173.0 lb

## 2012-06-05 DIAGNOSIS — F411 Generalized anxiety disorder: Secondary | ICD-10-CM

## 2012-06-05 DIAGNOSIS — F17200 Nicotine dependence, unspecified, uncomplicated: Secondary | ICD-10-CM

## 2012-06-05 DIAGNOSIS — F418 Other specified anxiety disorders: Secondary | ICD-10-CM

## 2012-06-05 DIAGNOSIS — F332 Major depressive disorder, recurrent severe without psychotic features: Secondary | ICD-10-CM

## 2012-06-05 MED ORDER — GABAPENTIN 300 MG PO CAPS: ORAL_CAPSULE | ORAL | Status: DC

## 2012-06-05 NOTE — Patient Instructions (Addendum)
If you are worth it?  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  That is the first day of full fresh air you will breath.  For smoking cessation, call the Smoking Cessation hotline at  619-641-2542  for support.    Shift to the Neurontin during the day for the anxiety and at night for the insomnia.    You have got to put into action what you know you need to do.  Have a happy holiday.

## 2012-06-05 NOTE — Progress Notes (Signed)
[facility] MD Progress Note  06/05/2012 3:08 PM [name]  MRN:  [id number] Subjective:  "I'm getting a long.  My nerves stay shot". Diagnosis:   Axis I: Anxiety Disorder NOS and Major Depression, Recurrent severe Axis II: Deferred Axis III:  Past Medical History  Diagnosis Date  . ASCVD (arteriosclerotic cardiovascular disease)     inferior MI with circumflex PTCA in 1993;cath in 2000-50% OM 1&2,30% LAD ;2008-25% LAD, 80% nondominant right ,60% small OM 2,EF of 55% with severe basilar inferior hypokinesis; stress nuclear study in 04/2008 moderate inferior scar with peri-infarction ischemic  . Syncope   . Hyperlipidemia   . Hypertension   . Tobacco abuse     50 pack years continuing at one halp pack daily  . COPD (chronic obstructive pulmonary disease)     mild ;excercise induced hypoxemia by cp stress test ;asthma ,bronchitis,  . Restless leg syndrome   . Anxiety and depression   . GERD (gastroesophageal reflux disease)   . Inflammatory polyps of colon with rectal bleeding   . Carcinoma in situ of colon 2004    rectal polyp  . H. pylori infection 2004    treated  . Sleep apnea     does not use CPAP  . Gastritis 12/30/10    EGD Dr Jena Gauss  . Colitis, ischemic 2011  . Tubular adenoma of colon 06/2009    Colonosocpy Dr Jena Gauss  . Leukocytosis     Dr Shon Baton  . Depression   . Anxiety   . Lung nodule 07/16/2011   Axis IV: other psychosocial or environmental problems Axis V: 41-50 serious symptoms  ADL's:  Intact  Sleep: Fair  Appetite:  Poor  Suicidal Ideation:  Pt has thoughts that get to him. He doesn't let it get that far, by keeping other things on his mind. Homicidal Ideation:  Pt has some struggles with his daughter's boy friend.  He stays away from him. AEB (as evidenced by):per pt report  Psychiatric Specialty Exam: ROS Neuro: no headaches, ataxia, weakness, gives out easily GI: no N/V/cramps/constipation, he keeps diarrhea for the past 6 or 7 years MS: no  weakness or aches. He catches cramps really bad.   Weight 173 lb (78.472 kg).There is no height on file to calculate BMI.  General Appearance: Casual  Eye Contact::  Fair  Speech:  Clear and Coherent  Volume:  Normal  Mood:  Anxious  Affect:  Congruent  Thought Process:  Coherent, Linear and Logical  Orientation:  Full (Time, Place, and Person)  Thought Content:  Hallucinations: grief  Suicidal Thoughts:  Yes.  without intent/plan  Homicidal Thoughts:  No  Memory:  Immediate;   Fair Recent;   Fair Remote;   Fair  Judgement:  Fair  Insight:  Fair  Psychomotor Activity:  Normal  Concentration:  Fair  Recall:  Fair  Akathisia:  No  Handed:  Right  AIMS (if indicated):     Assets:  Communication Skills Desire for Improvement  Sleep:      Current Medications: Current Outpatient Prescriptions  Medication Sig Dispense Refill  . ALPRAZolam (XANAX) 1 MG tablet Take 1 tablet (1 mg total) by mouth 4 (four) times daily as needed. For anxiety  120 tablet  5  . gabapentin (NEURONTIN) 300 MG capsule Take 300-600 mg by mouth 2 (two) times daily. Patient takes 1 capsule in the morning and 2 capsules at night      . OLANZapine (ZYPREXA) 5 MG tablet Take 5 mg by mouth  at bedtime.       Marland Kitchen rOPINIRole (REQUIP) 0.5 MG tablet Take 0.5 mg by mouth 3 (three) times daily.      Marland Kitchen albuterol (PROVENTIL) (2.5 MG/3ML) 0.083% nebulizer solution Take 2.5 mg by nebulization every 6 (six) hours as needed. For shortness of breath      . albuterol-ipratropium (COMBIVENT) 18-103 MCG/ACT inhaler Inhale 2 puffs into the lungs every 6 (six) hours as needed. For shortness of breath      . aspirin EC 81 MG tablet Take 81 mg by mouth daily.       Marland Kitchen atorvastatin (LIPITOR) 80 MG tablet 80 mg daily.       Marland Kitchen atorvastatin (LIPITOR) 80 MG tablet TAKE 1 TABLET BY MOUTH EVERY DAY  90 tablet  3  . budesonide-formoterol (SYMBICORT) 80-4.5 MCG/ACT inhaler Inhale 2 puffs into the lungs 2 (two) times daily.        Marland Kitchen  HYDROcodone-acetaminophen (NORCO) 7.5-325 MG per tablet Take 7.5-325 mg by mouth Three times a day.      . metoprolol succinate (TOPROL-XL) 25 MG 24 hr tablet TAKE 1 TABLET BY MOUTH EVERY DAY  30 tablet  6  . nitroGLYCERIN (NITROSTAT) 0.4 MG SL tablet Place 1 tablet (0.4 mg total) under the tongue every 5 (five) minutes as needed for chest pain.  25 tablet  3  . omeprazole (PRILOSEC) 20 MG capsule Take 20 mg by mouth daily.      . pravastatin (PRAVACHOL) 40 MG tablet Take 40 mg by mouth daily.       . promethazine (PHENERGAN) 25 MG suppository Place 25 mg rectally every 6 (six) hours as needed.      . Testosterone (ANDROGEL PUMP) 1.25 GM/ACT (1%) GEL Place onto the skin 4 (four) times daily.        . verapamil (CALAN-SR) 180 MG CR tablet Take 180 mg by mouth daily.      . vitamin B-12 (CYANOCOBALAMIN) 1000 MCG tablet Take 2,000 mcg by mouth daily.      . [DISCONTINUED] amLODipine (NORVASC) 5 MG tablet Take 5 mg by mouth daily.       . [DISCONTINUED] FLUoxetine (PROZAC) 20 MG capsule Take 20 mg by mouth 2 (two) times daily. Take 2 caps at one time        Lab Results: No results found for this or any previous visit (from the past 48 hour(s)).  Physical Findings: AIMS:  , ,  ,  ,    CIWA:    COWS:     Treatment Plan Summary: Medication management  Plan: I took his vitals.  I reviewed CC, tobacco/med/surg Hx, meds effects/ side effects, problem list, therapies and responses as well as current situation/symptoms discussed options. See orders and pt instructions for more details.  Medical Decision Making Problem Points:  Established problem, stable/improving (1), Review of last therapy session (1), Review of psycho-social stressors (1) and Self-limited or minor (1) Data Points:  Review of medication regiment & side effects (2)  I certify that outpatient services furnished can reasonably be expected to improve the patient's condition.   [name] 06/05/2012, 3:08 PM

## 2012-06-12 ENCOUNTER — Telehealth (HOSPITAL_COMMUNITY): Payer: Self-pay | Admitting: Psychiatry

## 2012-06-15 MED ORDER — ALPRAZOLAM 1 MG PO TABS: 1.0000 mg | ORAL_TABLET | Freq: Four times a day (QID) | ORAL | Status: DC | PRN

## 2012-06-15 NOTE — Telephone Encounter (Signed)
Phone message completed in the phone message section.  

## 2012-06-29 LAB — PULMONARY FUNCTION TEST

## 2012-07-11 ENCOUNTER — Ambulatory Visit: Payer: Self-pay | Admitting: Cardiology

## 2012-07-28 ENCOUNTER — Ambulatory Visit: Payer: Self-pay | Admitting: Cardiology

## 2012-07-28 ENCOUNTER — Ambulatory Visit: Payer: Self-pay | Admitting: Adult Health

## 2012-08-03 ENCOUNTER — Ambulatory Visit (INDEPENDENT_AMBULATORY_CARE_PROVIDER_SITE_OTHER): Payer: BC Managed Care – PPO | Admitting: Adult Health

## 2012-08-03 ENCOUNTER — Encounter: Payer: Self-pay | Admitting: Adult Health

## 2012-08-03 VITALS — BP 120/84 | HR 60 | Ht 71.0 in | Wt 166.0 lb

## 2012-08-03 DIAGNOSIS — K219 Gastro-esophageal reflux disease without esophagitis: Secondary | ICD-10-CM

## 2012-08-03 DIAGNOSIS — E7800 Pure hypercholesterolemia, unspecified: Secondary | ICD-10-CM

## 2012-08-03 DIAGNOSIS — I10 Essential (primary) hypertension: Secondary | ICD-10-CM

## 2012-08-03 DIAGNOSIS — I2510 Atherosclerotic heart disease of native coronary artery without angina pectoris: Secondary | ICD-10-CM

## 2012-08-03 DIAGNOSIS — E785 Hyperlipidemia, unspecified: Secondary | ICD-10-CM

## 2012-08-03 MED ORDER — FAMOTIDINE 20 MG PO TABS: 20.0000 mg | ORAL_TABLET | Freq: Every day | ORAL | Status: DC

## 2012-08-03 NOTE — Assessment & Plan Note (Signed)
No cardiac complaints at this time. He is not very physically active. Having no unstable angina symptoms. Continue risk factor management. No cardiac testing will be completed unless he is symptomatic.

## 2012-08-03 NOTE — Patient Instructions (Addendum)
Your physician recommends that you schedule a follow-up appointment in:6 MONTHS  Your physician has recommended you make the following change in your medication:   1) STOP LIPITOR 2) STOP OMEPRAZOLE (PRILOSEC) 3) START PEPCID 20MG  ONE TAB DAILY 4) CONTINUE TO TAKE PRAVASTATIN (PRAVACHOL)  Your physician recommends that you return for lab work in: THIS WEEK (BMET,LIPIDS, LFTS, MAG) SLIPS GIVEN  FOLLOW UP WITH GI MD [facility]

## 2012-08-03 NOTE — Progress Notes (Signed)
HPI: Mr. Laura is a 57 y/o patient of Dr.Rothbart we are seeing for ongoing assessment and treatment of CAD with PCI to of the RCA using a bare-metal stent, 2012, hypercholesterolemia, with history of COPD,  and ongoing tobacco abuse. He comes today with without cardiac complaints. Did have some chest tightness that occurred when he walked outside in the snow last week. He is using his inhalers more frequently as a result. Breathing better now that weather is warmer. He has no appetite and has been losing wt. He has had no hospitalizations or ER visits since being seen last.   No Known Allergies  Current Outpatient Prescriptions  Medication Sig Dispense Refill  . albuterol (PROVENTIL) (2.5 MG/3ML) 0.083% nebulizer solution Take 2.5 mg by nebulization every 6 (six) hours as needed. For shortness of breath      . albuterol-ipratropium (COMBIVENT) 18-103 MCG/ACT inhaler Inhale 2 puffs into the lungs every 6 (six) hours as needed. For shortness of breath      . ALPRAZolam (XANAX) 1 MG tablet Take 1 tablet (1 mg total) by mouth 4 (four) times daily as needed for anxiety. For anxiety  120 tablet  1  . aspirin EC 81 MG tablet Take 81 mg by mouth daily.       Marland Kitchen atorvastatin (LIPITOR) 80 MG tablet TAKE 1 TABLET BY MOUTH EVERY DAY  90 tablet  3  . budesonide-formoterol (SYMBICORT) 80-4.5 MCG/ACT inhaler Inhale 2 puffs into the lungs 2 (two) times daily.        Marland Kitchen gabapentin (NEURONTIN) 300 MG capsule Take 300 mg by mouth 3 (three) times daily.      Marland Kitchen HYDROcodone-acetaminophen (NORCO) 7.5-325 MG per tablet Take 7.5-325 mg by mouth Three times a day.      . metoprolol succinate (TOPROL-XL) 25 MG 24 hr tablet TAKE 1 TABLET BY MOUTH EVERY DAY  30 tablet  6  . nitroGLYCERIN (NITROSTAT) 0.4 MG SL tablet Place 1 tablet (0.4 mg total) under the tongue every 5 (five) minutes as needed for chest pain.  25 tablet  3  . OLANZapine (ZYPREXA) 5 MG tablet Take 5 mg by mouth at bedtime.       Marland Kitchen omeprazole (PRILOSEC) 20  MG capsule Take 20 mg by mouth daily.      . pravastatin (PRAVACHOL) 40 MG tablet Take 40 mg by mouth daily.       . promethazine (PHENERGAN) 25 MG suppository Place 25 mg rectally every 6 (six) hours as needed.      Marland Kitchen rOPINIRole (REQUIP) 0.5 MG tablet Take 0.5 mg by mouth 3 (three) times daily.      . Testosterone (ANDROGEL PUMP) 1.25 GM/ACT (1%) GEL Place onto the skin 4 (four) times daily.        . verapamil (CALAN-SR) 180 MG CR tablet Take 180 mg by mouth daily.      . vitamin B-12 (CYANOCOBALAMIN) 1000 MCG tablet Take 2,000 mcg by mouth daily.      . [DISCONTINUED] amLODipine (NORVASC) 5 MG tablet Take 5 mg by mouth daily.       . [DISCONTINUED] FLUoxetine (PROZAC) 20 MG capsule Take 20 mg by mouth 2 (two) times daily. Take 2 caps at one time       No current facility-administered medications for this visit.    Past Medical History  Diagnosis Date  . ASCVD (arteriosclerotic cardiovascular disease)     inferior MI with circumflex PTCA in 1993;cath in 2000-50% OM 1&2,30% LAD ;2008-25% LAD, 80%  nondominant right ,60% small OM 2,EF of 55% with severe basilar inferior hypokinesis; stress nuclear study in 04/2008 moderate inferior scar with peri-infarction ischemic  . Syncope   . Hyperlipidemia   . Hypertension   . Tobacco abuse     50 pack years continuing at one halp pack daily  . COPD (chronic obstructive pulmonary disease)     mild ;excercise induced hypoxemia by cp stress test ;asthma ,bronchitis,  . Restless leg syndrome   . Anxiety and depression   . GERD (gastroesophageal reflux disease)   . Inflammatory polyps of colon with rectal bleeding   . Carcinoma in situ of colon 2004    rectal polyp  . H. pylori infection 2004    treated  . Sleep apnea     does not use CPAP  . Gastritis 12/30/10    EGD Dr Jena Gauss  . Colitis, ischemic 2011  . Tubular adenoma of colon 06/2009    Colonosocpy Dr Jena Gauss  . Leukocytosis     Dr Shon Baton  . Depression   . Anxiety   . Lung nodule  07/16/2011    Past Surgical History  Procedure Laterality Date  . Colonoscopy w/ polypectomy  2004    rectal polyp with carcinoma in situ removed via colonoscopy  . Hand surgery      surgical intervention for injury of the fingers of the left hand many years ago  . Cholecystectomy  2004  . Colonoscopy  06/2009    normal terminal ileum, segmental mild inflammation of sigmoid colon (bx unremarkable), polyp, tubular adenoma  . Esophagogastroduodenoscopy  02/2009    query Barrett's but bx negative  . Esophagogastroduodenoscopy  12/30/2010    [name],gastritis, dilated 36F, sm HH, 1 small ulcer, Duodenal erosions, benign bx  . Flexible sigmoidoscopy  12/30/2010     [name],; internal hemorrhoids, anal papilla  . Heart stent    . Nasal septoplasty w/ turbinoplasty  10/06/2011    Procedure: NASAL SEPTOPLASTY WITH TURBINATE REDUCTION;  Surgeon: Serena Colonel, MD;  Location: [facility] OR;  Service: ENT;  Laterality: Bilateral;  . Cardiac catheterization    . Coronary angioplasty with stent placement  01/26/2012    "1; total is now 2"    ZOX:WRUEAV of systems complete and found to be negative unless listed above  PHYSICAL EXAM BP 120/84  Pulse 60  Ht 5\' 11"  (1.803 m)  Wt 166 lb (75.297 kg)  BMI 23.16 kg/m2  General: Well developed, well nourished, in no acute distress Head: Eyes PERRLA, No xanthomas.   Normal cephalic and atramatic  Lungs: Some mild inspiratory wheezes, no coughing. Heart: HRRR S1 S2, without MRG.  Pulses are 2+ & equal.            No carotid bruit. No JVD.  No abdominal bruits. No femoral bruits. Abdomen: Bowel sounds are positive, abdomen soft and non-tender without masses or                  Hernia's noted. Msk:  Back normal, normal gait. Normal strength and tone for age. Extremities: No clubbing, cyanosis or edema.  DP +1 Neuro: Alert and oriented X 3. Psych:  Good affect, responds appropriately  EKG: NSR rate of 73 bpm.  ASSESSMENT AND PLAN

## 2012-08-03 NOTE — Assessment & Plan Note (Signed)
Excellent control of BP presently. Continue current medications. Labs are pending.

## 2012-08-03 NOTE — Assessment & Plan Note (Signed)
It is noted that he is on both pravachol and atorvastatin. He is having some mild myalgia. I will stop the lipitor and continue pravachol. Check lipids and LFT,s and BMET.

## 2012-08-03 NOTE — Progress Notes (Deleted)
Name: [name]    DOB: 1955-06-22  Age: 57 y.o.  MR#: [id number]       PCP:  [name], MD      Insurance: Payor: [employer]  Plan: [employer] PPO  Product Type: *No Product type*    CC:    Chief Complaint  Patient presents with  . Follow-up    VS Filed Vitals:   08/03/12 1054  BP: 120/84  Pulse: 60  Height: 5\' 11"  (1.803 m)  Weight: 166 lb (75.297 kg)    Weights Current Weight  08/03/12 166 lb (75.297 kg)  06/05/12 173 lb (78.472 kg)  05/09/12 174 lb (78.926 kg)    Blood Pressure  BP Readings from Last 3 Encounters:  08/03/12 120/84  05/09/12 121/78  04/18/12 147/93     Admit date:  (Not on file) Last encounter with RMR:  07/28/2012   Allergy Review of patient's allergies indicates no known allergies.  Current Outpatient Prescriptions  Medication Sig Dispense Refill  . albuterol (PROVENTIL) (2.5 MG/3ML) 0.083% nebulizer solution Take 2.5 mg by nebulization every 6 (six) hours as needed. For shortness of breath      . albuterol-ipratropium (COMBIVENT) 18-103 MCG/ACT inhaler Inhale 2 puffs into the lungs every 6 (six) hours as needed. For shortness of breath      . ALPRAZolam (XANAX) 1 MG tablet Take 1 tablet (1 mg total) by mouth 4 (four) times daily as needed for anxiety. For anxiety  120 tablet  1  . aspirin EC 81 MG tablet Take 81 mg by mouth daily.       Marland Kitchen atorvastatin (LIPITOR) 80 MG tablet TAKE 1 TABLET BY MOUTH EVERY DAY  90 tablet  3  . budesonide-formoterol (SYMBICORT) 80-4.5 MCG/ACT inhaler Inhale 2 puffs into the lungs 2 (two) times daily.        Marland Kitchen gabapentin (NEURONTIN) 300 MG capsule Take 300 mg by mouth 3 (three) times daily.      Marland Kitchen HYDROcodone-acetaminophen (NORCO) 7.5-325 MG per tablet Take 7.5-325 mg by mouth Three times a day.      . metoprolol succinate (TOPROL-XL) 25 MG 24 hr tablet TAKE 1 TABLET BY MOUTH EVERY DAY  30 tablet  6  . nitroGLYCERIN (NITROSTAT) 0.4 MG SL tablet Place 1 tablet (0.4 mg total) under the tongue  every 5 (five) minutes as needed for chest pain.  25 tablet  3  . OLANZapine (ZYPREXA) 5 MG tablet Take 5 mg by mouth at bedtime.       Marland Kitchen omeprazole (PRILOSEC) 20 MG capsule Take 20 mg by mouth daily.      . pravastatin (PRAVACHOL) 40 MG tablet Take 40 mg by mouth daily.       . promethazine (PHENERGAN) 25 MG suppository Place 25 mg rectally every 6 (six) hours as needed.      Marland Kitchen rOPINIRole (REQUIP) 0.5 MG tablet Take 0.5 mg by mouth 3 (three) times daily.      . Testosterone (ANDROGEL PUMP) 1.25 GM/ACT (1%) GEL Place onto the skin 4 (four) times daily.        . verapamil (CALAN-SR) 180 MG CR tablet Take 180 mg by mouth daily.      . vitamin B-12 (CYANOCOBALAMIN) 1000 MCG tablet Take 2,000 mcg by mouth daily.      . [DISCONTINUED] amLODipine (NORVASC) 5 MG tablet Take 5 mg by mouth daily.       . [DISCONTINUED] FLUoxetine (PROZAC) 20 MG capsule Take 20 mg by mouth  2 (two) times daily. Take 2 caps at one time       No current facility-administered medications for this visit.    Discontinued Meds:    Medications Discontinued During This Encounter  Medication Reason  . atorvastatin (LIPITOR) 80 MG tablet Error  . gabapentin (NEURONTIN) 300 MG capsule Error    Patient Active Problem List  Diagnosis  . Hyperlipidemia  . TOBACCO ABUSE  . Depression with anxiety  . OBSTRUCTIVE SLEEP APNEA  . HYPERTENSION  . ATHEROSCLEROTIC CARDIOVASCULAR DISEASE  . Chronic obstructive pulmonary disease  . Leukocytosis  . RLS (restless legs syndrome)  . Lung nodule  . Gastroesophageal reflux disease    LABS @BMET3 @  @CMPRESULT3 @ @CBC3 @  Lipid Panel     Component Value Date/Time   CHOL 131 01/27/2012 0329   TRIG 311* 01/27/2012 0329   HDL 22* 01/27/2012 0329   CHOLHDL 6.0 01/27/2012 0329   VLDL 62* 01/27/2012 0329   LDLCALC 47 01/27/2012 0329    ABG    Component Value Date/Time   PHART 7.385 05/17/2012 0930   PCO2ART 38.9 05/17/2012 0930   PO2ART 84.8 05/17/2012 0930   HCO3 22.8 05/17/2012 0930    TCO2 19.3 05/17/2012 0930   ACIDBASEDEF 1.5 05/17/2012 0930   O2SAT 97.4 05/17/2012 0930     BNP (last 3 results) No results found for this basename: PROBNP,  in the last 8760 hours Cardiac Panel (last 3 results) No results found for this basename: CKTOTAL, CKMB, TROPONINI, RELINDX,  in the last 72 hours  Iron/TIBC/Ferritin No results found for this basename: iron, tibc, ferritin     EKG Orders placed in visit on 05/09/12  . EKG 12-LEAD     Prior Assessment and Plan Problem List as of 08/03/2012     ICD-9-CM   Hyperlipidemia   Last Assessment & Plan   05/09/2012 Office Visit Written 05/09/2012  9:32 PM by Kathlen Brunswick, MD     Lipid profile was good when last assessed less than one year ago.  We will verify current lipid-lowering therapy and likely continue it unchanged.    TOBACCO ABUSE   Last Assessment & Plan   05/09/2012 Office Visit Written 05/09/2012  9:34 PM by Kathlen Brunswick, MD     Complete discontinuation of tobacco use strongly encouraged.  We provided teaching and recommended use of nicotine gum.    Depression with anxiety   OBSTRUCTIVE SLEEP APNEA   Last Assessment & Plan   03/09/2011 Office Visit Written 03/09/2011  5:27 PM by Barbaraann Share, MD     The patient has a history of very mild sleep apnea, however I am not sure if this has anything to do with his daytime sleepiness.  He is on large numbers of sedating medications, and by history he may also have the restless leg syndrome.  He clearly is not going to tolerate CPAP, and I have discussed with him the other treatment options for his mild sleep apnea.  These include upper airway surgery, and also dental appliance.  I do not think he will tolerate an appliance given his nasal obstruction, but would consider the surgical option.  I will refer him to otolaryngology in the [place] area for evaluation.  Again, it is unclear how much this is contributing to his overall sleepiness.     HYPERTENSION   Last  Assessment & Plan   05/09/2012 Office Visit Written 05/09/2012  9:33 PM by Kathlen Brunswick, MD     Generally well  controlled with occasional systolics in the 140-150 range.  We will verify current antihypertensive regimen and alter as necessary.    ATHEROSCLEROTIC CARDIOVASCULAR DISEASE   Last Assessment & Plan   05/09/2012 Office Visit Edited 05/10/2012  9:17 AM by Kathlen Brunswick, MD     Dyspnea may be related to chronic lung disease.  Currently he has no symptoms that clearly indicate the presence of myocardial ischemia.  We will continue to attempt to optimally control cardiovascular risk factors.  He is 3 months out from placement of a bare-metal stent and should have discontinue clopidogrel 8 weeks ago; however, we're not entirely clear what medications he is taking.  He is asked to bring all medication vials to the office tomorrow for review and reassessment.    Chronic obstructive pulmonary disease   Last Assessment & Plan   05/09/2012 Office Visit Written 05/09/2012  9:29 PM by Kathlen Brunswick, MD     The patient has not received specialist care for some years nor has he undergone any specific testing.  Pulmonary function tests will be obtained.    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 GI workup as outlined above.    RLS (restless legs syndrome)   Last Assessment & Plan   03/09/2011 Office Visit Written 03/09/2011  5:28 PM by Barbaraann Share, MD     The pt has a history that is suggestive of RLS, and I think we should give him a trial of a dopamine agonist.  He will give me feedback in a few weeks with his response.     Lung nodule   Last Assessment & Plan   08/31/2011 Office Visit Written 08/31/2011  2:17 PM by Kathlen Brunswick, MD     03/2011-small nodule in the left lower lobe, 7.5 x 6.6 mm.  Previously 5.4 x 4.5 mm.   Pulmonary neoplasm cannot be  excluded.  Bullous emphysema.; continued CT surveillance     Gastroesophageal reflux disease   Last Assessment & Plan   05/09/2012 Office Visit Edited 05/10/2012  9:18 AM by Kathlen Brunswick, MD     Patient describes dysphagia, which, by his description, may be related to a swallowing disorder or anatomical problem with the upper esophagus.  Reconsultation with Dr. Jena Gauss advised.        Imaging: No results found.

## 2012-08-03 NOTE — Assessment & Plan Note (Signed)
He has been having a lot of diarrhea on prilosec. I will change this to Pepcid 20 mg daily, check Mg+ level. He is to follow up with Dr.Rehman for further assessment, As he is also on two statins, stopping one may also help with symptoms,.

## 2012-08-04 ENCOUNTER — Ambulatory Visit (HOSPITAL_COMMUNITY): Payer: Self-pay | Admitting: Psychiatry

## 2012-08-07 ENCOUNTER — Telehealth (HOSPITAL_COMMUNITY): Payer: Self-pay | Admitting: Psychiatry

## 2012-08-07 DIAGNOSIS — F418 Other specified anxiety disorders: Secondary | ICD-10-CM

## 2012-08-07 LAB — BASIC METABOLIC PANEL
BUN: 9 mg/dL (ref 6–23)
Calcium: 9.6 mg/dL (ref 8.4–10.5)
Creat: 0.84 mg/dL (ref 0.50–1.35)
Glucose, Bld: 105 mg/dL — ABNORMAL HIGH (ref 70–99)
Sodium: 139 mEq/L (ref 135–145)

## 2012-08-07 LAB — MAGNESIUM: Magnesium: 2.2 mg/dL (ref 1.5–2.5)

## 2012-08-07 LAB — HEPATIC FUNCTION PANEL
ALT: 31 U/L (ref 0–53)
AST: 16 U/L (ref 0–37)
Bilirubin, Direct: 0.1 mg/dL (ref 0.0–0.3)

## 2012-08-07 LAB — LIPID PANEL
Cholesterol: 123 mg/dL (ref 0–200)
Total CHOL/HDL Ratio: 4.6 Ratio

## 2012-08-07 MED ORDER — OLANZAPINE 5 MG PO TABS: 5.0000 mg | ORAL_TABLET | Freq: Every day | ORAL | Status: DC

## 2012-08-07 NOTE — Telephone Encounter (Signed)
Refill request approved via eScripts.  

## 2012-08-09 ENCOUNTER — Encounter (HOSPITAL_COMMUNITY): Payer: Self-pay | Admitting: Psychiatry

## 2012-08-09 ENCOUNTER — Ambulatory Visit (INDEPENDENT_AMBULATORY_CARE_PROVIDER_SITE_OTHER): Payer: BC Managed Care – PPO | Admitting: Psychiatry

## 2012-08-09 ENCOUNTER — Encounter: Payer: Self-pay | Admitting: *Deleted

## 2012-08-09 VITALS — Wt 162.0 lb

## 2012-08-09 DIAGNOSIS — F418 Other specified anxiety disorders: Secondary | ICD-10-CM

## 2012-08-09 DIAGNOSIS — F17200 Nicotine dependence, unspecified, uncomplicated: Secondary | ICD-10-CM

## 2012-08-09 DIAGNOSIS — F332 Major depressive disorder, recurrent severe without psychotic features: Secondary | ICD-10-CM

## 2012-08-09 DIAGNOSIS — F411 Generalized anxiety disorder: Secondary | ICD-10-CM

## 2012-08-09 MED ORDER — ALPRAZOLAM 1 MG PO TABS: 1.0000 mg | ORAL_TABLET | Freq: Three times a day (TID) | ORAL | Status: DC | PRN

## 2012-08-09 MED ORDER — OLANZAPINE 5 MG PO TABS: 5.0000 mg | ORAL_TABLET | Freq: Every day | ORAL | Status: DC

## 2012-08-09 NOTE — Progress Notes (Signed)
[facility] [id number] Progress Note [name] MRN: [id number] DOB: June 27, 1955 Age: 57 y.o.  Date: 08/09/2012 Start Time: 10:47 AM End Time: 11:04 AM  Chief Complaint: Chief Complaint  Patient presents with  . Depression  . Anxiety  . Follow-up  . Medication Refill    Subjective:  "I'm getting a long.  My nerves are doing pretty good today as long as I take my Xanaxt". Diagnosis:   Axis I: Anxiety Disorder NOS and Major Depression, Recurrent severe Axis II: Deferred Axis III:  Past Medical History  Diagnosis Date  . ASCVD (arteriosclerotic cardiovascular disease)     inferior MI with circumflex PTCA in 1993;cath in 2000-50% OM 1&2,30% LAD ;2008-25% LAD, 80% nondominant right ,60% small OM 2,EF of 55% with severe basilar inferior hypokinesis; stress nuclear study in 04/2008 moderate inferior scar with peri-infarction ischemic  . Syncope   . Hyperlipidemia   . Hypertension   . Tobacco abuse     50 pack years continuing at one halp pack daily  . COPD (chronic obstructive pulmonary disease)     mild ;excercise induced hypoxemia by cp stress test ;asthma ,bronchitis,  . Restless leg syndrome   . Anxiety and depression   . GERD (gastroesophageal reflux disease)   . Inflammatory polyps of colon with rectal bleeding   . Carcinoma in situ of colon 2004    rectal polyp  . H. pylori infection 2004    treated  . Sleep apnea     does not use CPAP  . Gastritis 12/30/10    EGD Dr Jena Gauss  . Colitis, ischemic 2011  . Tubular adenoma of colon 06/2009    Colonosocpy Dr Jena Gauss  . Leukocytosis     Dr Shon Baton  . Depression   . Anxiety   . Lung nodule 07/16/2011   Axis IV: other psychosocial or environmental problems Axis V: 41-50 serious symptoms  ADL's:  Intact  Sleep: Fair  Appetite:  Poor  Suicidal Ideation:  Pt has thoughts that are off and on, but not too bad lately Homicidal Ideation:  Pt has some struggles with his daughter's boy friend.  He stays away  from him. AEB (as evidenced by):per pt report  Psychiatric Specialty Exam: ROS Neuro: no headaches, ataxia, weakness, gives out easily GI: no N/V/cramps/constipation, he keeps diarrhea for the past 6 or 7 years MS: no weakness or aches. He catches cramps really bad.   Weight 162 lb (73.483 kg).Body mass index is 22.6 kg/(m^2).  General Appearance: Casual  Eye Contact::  Fair  Speech:  Clear and Coherent  Volume:  Normal  Mood:  Anxious  Affect:  Congruent  Thought Process:  Coherent, Linear and Logical  Orientation:  Full (Time, Place, and Person)  Thought Content:  Hallucinations: grief  Suicidal Thoughts:  Yes.  without intent/plan  Homicidal Thoughts:  No  Memory:  Immediate;   Fair Recent;   Fair Remote;   Fair  Judgement:  Fair  Insight:  Fair  Psychomotor Activity:  Normal  Concentration:  Fair  Recall:  Fair  Akathisia:  No  Handed:  Right  AIMS (if indicated):     Assets:  Communication Skills Desire for Improvement  Sleep:      Current Medications: Zyprexa 5 mg at bed time Xanax 1 mg TID Neurontin 300 mg TID by another provider  Lab Results: No results found for this or any previous visit (from the past 48 hour(s)). Cardiologist draws periodic labs.  He has not heard back  on the last set.    Physical Findings: AIMS:  , ,  ,  ,    CIWA:    COWS:     Treatment Plan Summary: Medication management  Plan: I took his vitals.  I reviewed CC, tobacco/med/surg Hx, meds effects/ side effects, problem list, therapies and responses as well as current situation/symptoms discussed options. See orders and pt instructions for more details.  Medical Decision Making Problem Points:  Established problem, stable/improving (1), Review of last therapy session (1) and Review of psycho-social stressors (1) Data Points:  Review or order clinical lab tests (1) Review of medication regiment & side effects (2)  I certify that outpatient services furnished can reasonably be  expected to improve the patient's condition.   [name] 08/09/2012, 10:50 AM

## 2012-08-09 NOTE — Patient Instructions (Signed)
Let  Me know the results of any abnormal labs, particularly liver functions or tendencies towards diabetes.  Call if problems or concerns.

## 2012-08-28 ENCOUNTER — Ambulatory Visit: Payer: Self-pay | Admitting: Gastroenterology

## 2012-09-05 ENCOUNTER — Encounter: Payer: Self-pay | Admitting: Gastroenterology

## 2012-09-05 ENCOUNTER — Ambulatory Visit (INDEPENDENT_AMBULATORY_CARE_PROVIDER_SITE_OTHER): Payer: BC Managed Care – PPO | Admitting: Gastroenterology

## 2012-09-05 VITALS — BP 136/87 | HR 73 | Temp 97.0°F | Ht 71.0 in | Wt 168.0 lb

## 2012-09-05 DIAGNOSIS — R1310 Dysphagia, unspecified: Secondary | ICD-10-CM | POA: Insufficient documentation

## 2012-09-05 DIAGNOSIS — K3189 Other diseases of stomach and duodenum: Secondary | ICD-10-CM

## 2012-09-05 DIAGNOSIS — R195 Other fecal abnormalities: Secondary | ICD-10-CM

## 2012-09-05 DIAGNOSIS — R1013 Epigastric pain: Secondary | ICD-10-CM | POA: Insufficient documentation

## 2012-09-05 MED ORDER — PANTOPRAZOLE SODIUM 40 MG PO TBEC: 40.0000 mg | DELAYED_RELEASE_TABLET | Freq: Every day | ORAL | Status: DC

## 2012-09-05 NOTE — Patient Instructions (Addendum)
Please complete the stool sample and return to our office.  Stop Pepcid. Start taking Protonix once each day, 30 minutes before breakfast.  We have scheduled you for an upper endoscopy with dilation with Dr. Jena Gauss in the near future.  We may need to do further tests, such as a CT scan, once this is complete.

## 2012-09-05 NOTE — Progress Notes (Signed)
 Referring Provider: Luking, Scott A, MD Primary Care Physician:  LUKING,SCOTT, MD Primary Gastroenterologist: Dr. Rourk   Chief Complaint  Patient presents with  . Weight Loss    HPI:   Mr. Santore is a pleasant 57-year-old male presenting today at the request of Kathy Lawrence, FNP with cardiology, due to GERD and weight loss. He was last seen by our office in Dec 2012 due to diarrhea and weight loss. He was asked to start imodium at that visit. BPE never completed as scheduled. Last flex sig in July 2012 with Dr. Rourk, EGD performed at same time with negative path. Due for surveillance colonoscopy in June 2016.   Last weight with us 171, today 168. His weight has been fluctuating during this interim with a high of 186 last August.   States he has no appetite, eats about once a day, sometimes not that much. States when he does eat, it "goes right through me like water". Drinks tea mainly. No nausea. No vomiting. Forces himself to eat at supper. Sometimes esophageal dysphagia. Gets full with small amounts. Usually loose stool 15-20 minutes after eating. Not taking Imodium. Taking Pepcid daily instead of Prilosec. Denies reflux. No rectal bleeding. Rare Ibuprofen.   Past Medical History  Diagnosis Date  . ASCVD (arteriosclerotic cardiovascular disease)     inferior MI with circumflex PTCA in 1993;cath in 2000-50% OM 1&2,30% LAD ;2008-25% LAD, 80% nondominant right ,60% small OM 2,EF of 55% with severe basilar inferior hypokinesis; stress nuclear study in 04/2008 moderate inferior scar with peri-infarction ischemic  . Syncope   . Hyperlipidemia   . Hypertension   . Tobacco abuse     50 pack years continuing at one halp pack daily  . COPD (chronic obstructive pulmonary disease)     mild ;excercise induced hypoxemia by cp stress test ;asthma ,bronchitis,  . Restless leg syndrome   . Anxiety and depression   . GERD (gastroesophageal reflux disease)   . Inflammatory polyps of colon with  rectal bleeding   . Carcinoma in situ of colon 2004    rectal polyp  . H. pylori infection 2004    treated  . Sleep apnea     does not use CPAP  . Gastritis 12/30/10    EGD Dr Rourk  . Colitis, ischemic 2011  . Tubular adenoma of colon 06/2009    Colonosocpy Dr Rourk  . Leukocytosis     Dr Khan/Neijstrom  . Depression   . Anxiety   . Lung nodule 07/16/2011    Past Surgical History  Procedure Laterality Date  . Colonoscopy w/ polypectomy  2004    rectal polyp with carcinoma in situ removed via colonoscopy  . Hand surgery      surgical intervention for injury of the fingers of the left hand many years ago  . Cholecystectomy  2004  . Colonoscopy  06/2009    normal terminal ileum, segmental mild inflammation of sigmoid colon (bx unremarkable), polyp, tubular adenoma  . Esophagogastroduodenoscopy  02/2009    query Barrett's but bx negative  . Esophagogastroduodenoscopy  12/30/2010    Robert M Rourk,gastritis, dilated 57F, sm HH, 1 small ulcer, Duodenal erosions, benign bx  . Flexible sigmoidoscopy  12/30/2010     Robert M Rourk,; internal hemorrhoids, anal papilla  . Heart stent    . Nasal septoplasty w/ turbinoplasty  10/06/2011    Procedure: NASAL SEPTOPLASTY WITH TURBINATE REDUCTION;  Surgeon: Jefry Rosen, MD;  Location: [facility] OR;  Service: ENT;  Laterality: Bilateral;  .   Cardiac catheterization    . Coronary angioplasty with stent placement  01/26/2012    "1; total is now 2"    Current Outpatient Prescriptions  Medication Sig Dispense Refill  . albuterol (PROVENTIL) (2.5 MG/3ML) 0.083% nebulizer solution Take 2.5 mg by nebulization every 6 (six) hours as needed. For shortness of breath      . albuterol-ipratropium (COMBIVENT) 18-103 MCG/ACT inhaler Inhale 2 puffs into the lungs every 6 (six) hours as needed. For shortness of breath      . ALPRAZolam (XANAX) 1 MG tablet Take 1 tablet (1 mg total) by mouth 3 (three) times daily as needed for anxiety. For anxiety  90 tablet  1  .  aspirin EC 81 MG tablet Take 81 mg by mouth daily.       . budesonide-formoterol (SYMBICORT) 80-4.5 MCG/ACT inhaler Inhale 2 puffs into the lungs 2 (two) times daily.        . gabapentin (NEURONTIN) 300 MG capsule Take 300 mg by mouth 3 (three) times daily.      . HYDROcodone-acetaminophen (NORCO) 7.5-325 MG per tablet Take 7.5-325 mg by mouth Three times a day.      . metoprolol succinate (TOPROL-XL) 25 MG 24 hr tablet TAKE 1 TABLET BY MOUTH EVERY DAY  30 tablet  6  . nitroGLYCERIN (NITROSTAT) 0.4 MG SL tablet Place 1 tablet (0.4 mg total) under the tongue every 5 (five) minutes as needed for chest pain.  25 tablet  3  . OLANZapine (ZYPREXA) 5 MG tablet Take 1 tablet (5 mg total) by mouth at bedtime.  30 tablet  1  . promethazine (PHENERGAN) 25 MG suppository Place 25 mg rectally every 6 (six) hours as needed.      . rOPINIRole (REQUIP) 0.5 MG tablet Take 0.5 mg by mouth 3 (three) times daily.      . Testosterone (ANDROGEL PUMP) 1.25 GM/ACT (1%) GEL Place onto the skin 4 (four) times daily.        . vitamin B-12 (CYANOCOBALAMIN) 1000 MCG tablet Take 2,000 mcg by mouth daily.      . pantoprazole (PROTONIX) 40 MG tablet Take 1 tablet (40 mg total) by mouth daily.  30 tablet  3  . pravastatin (PRAVACHOL) 40 MG tablet TAKE 1 TABLET BY MOUTH EVERY DAY  30 tablet  6  . verapamil (CALAN-SR) 180 MG CR tablet TAKE 1 TABLET EVERY DAY  30 tablet  10  . [DISCONTINUED] amLODipine (NORVASC) 5 MG tablet Take 5 mg by mouth daily.       . [DISCONTINUED] FLUoxetine (PROZAC) 20 MG capsule Take 20 mg by mouth 2 (two) times daily. Take 2 caps at one time       No current facility-administered medications for this visit.    Allergies as of 09/05/2012  . (No Known Allergies)    Family History  Problem Relation Age of Onset  . Lung disease Father     deceased, black lung  . Heart disease Mother     blood clots  . Depression Mother   . Cancer Paternal Uncle     unknown type  . Colon cancer Neg Hx   . ADD /  ADHD Neg Hx   . Alcohol abuse Neg Hx   . Drug abuse Neg Hx   . Anxiety disorder Neg Hx   . Bipolar disorder Neg Hx   . Dementia Neg Hx   . OCD Neg Hx   . Paranoid behavior Neg Hx   . Schizophrenia Neg Hx   .   Physical abuse Neg Hx   . Sexual abuse Neg Hx   . Seizures Neg Hx   . Cancer Maternal Aunt     unknown type  . Kidney failure Maternal Uncle     History   Social History  . Marital Status: Married    Spouse Name: N/A    Number of Children: 3  . Years of Education: N/A   Occupational History  . disable     [employer]   Social History Main Topics  . Smoking status: Current Some Day Smoker -- 0.50 packs/day for 30 years    Types: Cigarettes    Last Attempt to Quit: 01/13/2011  . Smokeless tobacco: Never Used     Comment: tried to quit with patches and made chest hurt  . Alcohol Use: 1.2 oz/week    2 Cans of beer per week     Comment: Drinks a beer occasionally  . Drug Use: No  . Sexually Active: None   Other Topics Concern  . None   Social History Narrative   3 stepchildren    Review of Systems: Gen: SEE HPI. NIGHT TIME SWEATS  CV: Denies chest pain, palpitations, syncope, peripheral edema, and claudication. Resp: +wheezing GI: SEE HPI Derm: Denies rash, itching, dry skin Psych: "stays depressed right much" Dr. Walker, psychiatrist.  Heme: Denies bruising, bleeding, and enlarged lymph nodes.  Physical Exam: BP 136/87  Pulse 73  Temp(Src) 97 F (36.1 C) (Oral)  Ht 5' 11" (1.803 m)  Wt 168 lb (76.204 kg)  BMI 23.44 kg/m2 General:   Alert and oriented. No distress noted. Pleasant and cooperative.  Head:  Normocephalic and atraumatic. Eyes:  Conjuctiva clear without scleral icterus. Mouth:  Oral mucosa pink and moist. Good dentition. No lesions. Neck:  Supple, without mass or thyromegaly. Heart:  S1, S2 present without murmurs, rubs, or gallops. Regular rate and rhythm. Abdomen:  +BS, soft, non-tender and non-distended. No rebound or guarding. No HSM or  masses noted. Msk:  Symmetrical without gross deformities. Normal posture. Extremities:  Without edema. Neurologic:  Alert and  oriented x4;  grossly normal neurologically. Skin:  Intact without significant lesions or rashes. Cervical Nodes:  No significant cervical adenopathy. Psych:  Alert and cooperative. Normal mood and affect.  

## 2012-09-06 ENCOUNTER — Other Ambulatory Visit: Payer: Self-pay | Admitting: Cardiology

## 2012-09-07 ENCOUNTER — Encounter: Payer: Self-pay | Admitting: Internal Medicine

## 2012-09-07 DIAGNOSIS — R195 Other fecal abnormalities: Secondary | ICD-10-CM | POA: Insufficient documentation

## 2012-09-07 NOTE — Assessment & Plan Note (Signed)
EGD/ED as planned.  

## 2012-09-07 NOTE — Assessment & Plan Note (Addendum)
57 year old male with history of chronic GERD, prior fluctuations in weight but only down 3 lbs from last visit, now with lack of appetite, early satiety, eating only one meal a day. States "goes through me like water. History of chronic loose stools with flex sig in 2012 unimpressive. Due for total colonoscopy June 2016. Intermittent dysphagia noted, with last dilation in 2012 at time of EGD: noted gastritis, negative biopsy. No melena noted. Unclear etiology at this point; needs EGD with dilation in near future due to dyspepsia and dysphagia. Would consider CT scan to assess for occult malignancy if EGD negative.  Proceed with upper endoscopy/dilation in the near future with Dr. Jena Gauss. The risks, benefits, and alternatives have been discussed in detail with patient. They have stated understanding and desire to proceed.  Phenergan 25 mg IV on call due to polypharmacy Avoid Prilosec, which patient had previously been on. Trial Protonix.

## 2012-09-07 NOTE — Assessment & Plan Note (Addendum)
Chronic, negative flex sig on file from 2012. Update with GI pathogen stool studies. May need adjunct therapy to include Imodium as previously recommended, possible Bentyl. Next TCS in June 2016.

## 2012-09-12 ENCOUNTER — Encounter (HOSPITAL_COMMUNITY)
Admission: RE | Disposition: A | Discharge status: Home / Self Care | Payer: Self-pay | Source: Ambulatory Visit | Attending: Internal Medicine

## 2012-09-12 ENCOUNTER — Ambulatory Visit (HOSPITAL_COMMUNITY)
Admission: RE | Admit: 2012-09-12 | Discharge: 2012-09-12 | Disposition: A | Discharge status: Home / Self Care | Payer: BC Managed Care – PPO | Source: Ambulatory Visit | Attending: Internal Medicine | Admitting: Internal Medicine

## 2012-09-12 ENCOUNTER — Telehealth: Payer: Self-pay | Admitting: General Practice

## 2012-09-12 ENCOUNTER — Other Ambulatory Visit: Payer: Self-pay | Admitting: Internal Medicine

## 2012-09-12 ENCOUNTER — Encounter (HOSPITAL_COMMUNITY): Payer: Self-pay | Admitting: *Deleted

## 2012-09-12 DIAGNOSIS — R1310 Dysphagia, unspecified: Secondary | ICD-10-CM | POA: Insufficient documentation

## 2012-09-12 DIAGNOSIS — K3189 Other diseases of stomach and duodenum: Secondary | ICD-10-CM

## 2012-09-12 DIAGNOSIS — K222 Esophageal obstruction: Secondary | ICD-10-CM

## 2012-09-12 DIAGNOSIS — R6881 Early satiety: Secondary | ICD-10-CM | POA: Insufficient documentation

## 2012-09-12 DIAGNOSIS — K449 Diaphragmatic hernia without obstruction or gangrene: Secondary | ICD-10-CM | POA: Insufficient documentation

## 2012-09-12 DIAGNOSIS — K2960 Other gastritis without bleeding: Secondary | ICD-10-CM

## 2012-09-12 DIAGNOSIS — R1013 Epigastric pain: Secondary | ICD-10-CM

## 2012-09-12 DIAGNOSIS — R634 Abnormal weight loss: Secondary | ICD-10-CM

## 2012-09-12 DIAGNOSIS — J4489 Other specified chronic obstructive pulmonary disease: Secondary | ICD-10-CM | POA: Insufficient documentation

## 2012-09-12 DIAGNOSIS — I10 Essential (primary) hypertension: Secondary | ICD-10-CM | POA: Insufficient documentation

## 2012-09-12 DIAGNOSIS — K319 Disease of stomach and duodenum, unspecified: Secondary | ICD-10-CM | POA: Insufficient documentation

## 2012-09-12 DIAGNOSIS — J449 Chronic obstructive pulmonary disease, unspecified: Secondary | ICD-10-CM | POA: Insufficient documentation

## 2012-09-12 HISTORY — PX: ESOPHAGOGASTRODUODENOSCOPY (EGD) WITH ESOPHAGEAL DILATION: SHX5812

## 2012-09-12 SURGERY — ESOPHAGOGASTRODUODENOSCOPY (EGD) WITH ESOPHAGEAL DILATION
Anesthesia: Moderate Sedation

## 2012-09-12 MED ORDER — PROMETHAZINE HCL 25 MG/ML IJ SOLN
25.0000 mg | Freq: Once | INTRAMUSCULAR | Status: AC
  Administered 2012-09-12: 25 mg via INTRAVENOUS

## 2012-09-12 MED ORDER — SODIUM CHLORIDE 0.9 % IV SOLN
INTRAVENOUS | Status: DC
  Administered 2012-09-12: 1000 mL via INTRAVENOUS

## 2012-09-12 MED ORDER — STERILE WATER FOR IRRIGATION IR SOLN
Status: DC | PRN
  Administered 2012-09-12: 12:00:00

## 2012-09-12 MED ORDER — BUTAMBEN-TETRACAINE-BENZOCAINE 2-2-14 % EX AERO
INHALATION_SPRAY | CUTANEOUS | Status: DC | PRN
  Administered 2012-09-12: 1 via TOPICAL

## 2012-09-12 MED ORDER — MIDAZOLAM HCL 5 MG/5ML IJ SOLN
INTRAMUSCULAR | Status: AC
  Filled 2012-09-12: qty 10

## 2012-09-12 MED ORDER — PROMETHAZINE HCL 25 MG/ML IJ SOLN
INTRAMUSCULAR | Status: AC
  Filled 2012-09-12: qty 1

## 2012-09-12 MED ORDER — MIDAZOLAM HCL 5 MG/5ML IJ SOLN
INTRAMUSCULAR | Status: DC | PRN
  Administered 2012-09-12: 2 mg via INTRAVENOUS
  Administered 2012-09-12 (×2): 1 mg via INTRAVENOUS

## 2012-09-12 MED ORDER — SODIUM CHLORIDE 0.9 % IJ SOLN
INTRAMUSCULAR | Status: AC
Start: 2012-09-12 — End: 2012-09-12
  Filled 2012-09-12: qty 10

## 2012-09-12 MED ORDER — SODIUM CHLORIDE 0.9 % IJ SOLN
INTRAMUSCULAR | Status: AC
  Filled 2012-09-12: qty 10

## 2012-09-12 MED ORDER — ONDANSETRON HCL 4 MG/2ML IJ SOLN
INTRAMUSCULAR | Status: AC
  Filled 2012-09-12: qty 2

## 2012-09-12 MED ORDER — MEPERIDINE HCL 100 MG/ML IJ SOLN
INTRAMUSCULAR | Status: AC
  Filled 2012-09-12: qty 2

## 2012-09-12 MED ORDER — ONDANSETRON HCL 4 MG/2ML IJ SOLN
INTRAMUSCULAR | Status: DC | PRN
  Administered 2012-09-12: 4 mg via INTRAVENOUS

## 2012-09-12 MED ORDER — MEPERIDINE HCL 100 MG/ML IJ SOLN
INTRAMUSCULAR | Status: DC | PRN
  Administered 2012-09-12: 50 mg via INTRAVENOUS
  Administered 2012-09-12: 25 mg via INTRAVENOUS

## 2012-09-12 NOTE — H&P (View-Only) (Signed)
Referring Provider: Babs Sciara, MD Primary Care Physician:  [name], MD Primary Gastroenterologist: Dr. Jena Gauss   Chief Complaint  Patient presents with  . Weight Loss    HPI:   [name] is a pleasant 57 year old male presenting today at the request of Harrold Donath, FNP with cardiology, due to GERD and weight loss. He was last seen by our office in Dec 2012 due to diarrhea and weight loss. He was asked to start imodium at that visit. BPE never completed as scheduled. Last flex sig in July 2012 with Dr. Jena Gauss, EGD performed at same time with negative path. Due for surveillance colonoscopy in June 2016.   Last weight with [place] 171, today 168. His weight has been fluctuating during this interim with a high of 186 last August.   States he has no appetite, eats about once a day, sometimes not that much. States when he does eat, it "goes right through me like water". Drinks tea mainly. No nausea. No vomiting. Forces himself to eat at supper. Sometimes esophageal dysphagia. Gets full with small amounts. Usually loose stool 15-20 minutes after eating. Not taking Imodium. Taking Pepcid daily instead of Prilosec. Denies reflux. No rectal bleeding. Rare Ibuprofen.   Past Medical History  Diagnosis Date  . ASCVD (arteriosclerotic cardiovascular disease)     inferior MI with circumflex PTCA in 1993;cath in 2000-50% OM 1&2,30% LAD ;2008-25% LAD, 80% nondominant right ,60% small OM 2,EF of 55% with severe basilar inferior hypokinesis; stress nuclear study in 04/2008 moderate inferior scar with peri-infarction ischemic  . Syncope   . Hyperlipidemia   . Hypertension   . Tobacco abuse     50 pack years continuing at one halp pack daily  . COPD (chronic obstructive pulmonary disease)     mild ;excercise induced hypoxemia by cp stress test ;asthma ,bronchitis,  . Restless leg syndrome   . Anxiety and depression   . GERD (gastroesophageal reflux disease)   . Inflammatory polyps of colon with  rectal bleeding   . Carcinoma in situ of colon 2004    rectal polyp  . H. pylori infection 2004    treated  . Sleep apnea     does not use CPAP  . Gastritis 12/30/10    EGD Dr Jena Gauss  . Colitis, ischemic 2011  . Tubular adenoma of colon 06/2009    Colonosocpy Dr Jena Gauss  . Leukocytosis     Dr Shon Baton  . Depression   . Anxiety   . Lung nodule 07/16/2011    Past Surgical History  Procedure Laterality Date  . Colonoscopy w/ polypectomy  2004    rectal polyp with carcinoma in situ removed via colonoscopy  . Hand surgery      surgical intervention for injury of the fingers of the left hand many years ago  . Cholecystectomy  2004  . Colonoscopy  06/2009    normal terminal ileum, segmental mild inflammation of sigmoid colon (bx unremarkable), polyp, tubular adenoma  . Esophagogastroduodenoscopy  02/2009    query Barrett's but bx negative  . Esophagogastroduodenoscopy  12/30/2010    [name],gastritis, dilated 25F, sm HH, 1 small ulcer, Duodenal erosions, benign bx  . Flexible sigmoidoscopy  12/30/2010     [name],; internal hemorrhoids, anal papilla  . Heart stent    . Nasal septoplasty w/ turbinoplasty  10/06/2011    Procedure: NASAL SEPTOPLASTY WITH TURBINATE REDUCTION;  Surgeon: Serena Colonel, MD;  Location: [facility] OR;  Service: ENT;  Laterality: Bilateral;  .  Cardiac catheterization    . Coronary angioplasty with stent placement  01/26/2012    "1; total is now 2"    Current Outpatient Prescriptions  Medication Sig Dispense Refill  . albuterol (PROVENTIL) (2.5 MG/3ML) 0.083% nebulizer solution Take 2.5 mg by nebulization every 6 (six) hours as needed. For shortness of breath      . albuterol-ipratropium (COMBIVENT) 18-103 MCG/ACT inhaler Inhale 2 puffs into the lungs every 6 (six) hours as needed. For shortness of breath      . ALPRAZolam (XANAX) 1 MG tablet Take 1 tablet (1 mg total) by mouth 3 (three) times daily as needed for anxiety. For anxiety  90 tablet  1  .  aspirin EC 81 MG tablet Take 81 mg by mouth daily.       . budesonide-formoterol (SYMBICORT) 80-4.5 MCG/ACT inhaler Inhale 2 puffs into the lungs 2 (two) times daily.        Marland Kitchen gabapentin (NEURONTIN) 300 MG capsule Take 300 mg by mouth 3 (three) times daily.      Marland Kitchen HYDROcodone-acetaminophen (NORCO) 7.5-325 MG per tablet Take 7.5-325 mg by mouth Three times a day.      . metoprolol succinate (TOPROL-XL) 25 MG 24 hr tablet TAKE 1 TABLET BY MOUTH EVERY DAY  30 tablet  6  . nitroGLYCERIN (NITROSTAT) 0.4 MG SL tablet Place 1 tablet (0.4 mg total) under the tongue every 5 (five) minutes as needed for chest pain.  25 tablet  3  . OLANZapine (ZYPREXA) 5 MG tablet Take 1 tablet (5 mg total) by mouth at bedtime.  30 tablet  1  . promethazine (PHENERGAN) 25 MG suppository Place 25 mg rectally every 6 (six) hours as needed.      Marland Kitchen rOPINIRole (REQUIP) 0.5 MG tablet Take 0.5 mg by mouth 3 (three) times daily.      . Testosterone (ANDROGEL PUMP) 1.25 GM/ACT (1%) GEL Place onto the skin 4 (four) times daily.        . vitamin B-12 (CYANOCOBALAMIN) 1000 MCG tablet Take 2,000 mcg by mouth daily.      . pantoprazole (PROTONIX) 40 MG tablet Take 1 tablet (40 mg total) by mouth daily.  30 tablet  3  . pravastatin (PRAVACHOL) 40 MG tablet TAKE 1 TABLET BY MOUTH EVERY DAY  30 tablet  6  . verapamil (CALAN-SR) 180 MG CR tablet TAKE 1 TABLET EVERY DAY  30 tablet  10  . [DISCONTINUED] amLODipine (NORVASC) 5 MG tablet Take 5 mg by mouth daily.       . [DISCONTINUED] FLUoxetine (PROZAC) 20 MG capsule Take 20 mg by mouth 2 (two) times daily. Take 2 caps at one time       No current facility-administered medications for this visit.    Allergies as of 09/05/2012  . (No Known Allergies)    Family History  Problem Relation Age of Onset  . Lung disease Father     deceased, black lung  . Heart disease Mother     blood clots  . Depression Mother   . Cancer Paternal Uncle     unknown type  . Colon cancer Neg Hx   . ADD /  ADHD Neg Hx   . Alcohol abuse Neg Hx   . Drug abuse Neg Hx   . Anxiety disorder Neg Hx   . Bipolar disorder Neg Hx   . Dementia Neg Hx   . OCD Neg Hx   . Paranoid behavior Neg Hx   . Schizophrenia Neg Hx   .  Physical abuse Neg Hx   . Sexual abuse Neg Hx   . Seizures Neg Hx   . Cancer Maternal Aunt     unknown type  . Kidney failure Maternal Uncle     History   Social History  . Marital Status: Married    Spouse Name: N/A    Number of Children: 3  . Years of Education: N/A   Occupational History  . disable     [employer]   Social History Main Topics  . Smoking status: Current Some Day Smoker -- 0.50 packs/day for 30 years    Types: Cigarettes    Last Attempt to Quit: 01/13/2011  . Smokeless tobacco: Never Used     Comment: tried to quit with patches and made chest hurt  . Alcohol Use: 1.2 oz/week    2 Cans of beer per week     Comment: Drinks a beer occasionally  . Drug Use: No  . Sexually Active: None   Other Topics Concern  . None   Social History Narrative   3 stepchildren    Review of Systems: Gen: SEE HPI. NIGHT TIME SWEATS  CV: Denies chest pain, palpitations, syncope, peripheral edema, and claudication. Resp: +wheezing GI: SEE HPI Derm: Denies rash, itching, dry skin Psych: "stays depressed right much" Dr. Dan Humphreys, psychiatrist.  Heme: Denies bruising, bleeding, and enlarged lymph nodes.  Physical Exam: BP 136/87  Pulse 73  Temp(Src) 97 F (36.1 C) (Oral)  Ht 5\' 11"  (1.803 m)  Wt 168 lb (76.204 kg)  BMI 23.44 kg/m2 General:   Alert and oriented. No distress noted. Pleasant and cooperative.  Head:  Normocephalic and atraumatic. Eyes:  Conjuctiva clear without scleral icterus. Mouth:  Oral mucosa pink and moist. Good dentition. No lesions. Neck:  Supple, without mass or thyromegaly. Heart:  S1, S2 present without murmurs, rubs, or gallops. Regular rate and rhythm. Abdomen:  +BS, soft, non-tender and non-distended. No rebound or guarding. No HSM or  masses noted. Msk:  Symmetrical without gross deformities. Normal posture. Extremities:  Without edema. Neurologic:  Alert and  oriented x4;  grossly normal neurologically. Skin:  Intact without significant lesions or rashes. Cervical Nodes:  No significant cervical adenopathy. Psych:  Alert and cooperative. Normal mood and affect.

## 2012-09-12 NOTE — Telephone Encounter (Signed)
CT scheduled for Thursday 04/03 at 1:45  And patient is aware

## 2012-09-12 NOTE — Telephone Encounter (Signed)
Per Dr. Jena Gauss pt needs a CT abd/pelvis with contrast [id number] satiety, wt loss

## 2012-09-12 NOTE — Interval H&P Note (Signed)
History and Physical Interval Note:  09/12/2012 12:02 PM  [name]  has presented today for surgery, with the diagnosis of Dyspepsia  The various methods of treatment have been discussed with the patient and family. After consideration of risks, benefits and other options for treatment, the patient has consented to  Procedure(s) with comments: ESOPHAGOGASTRODUODENOSCOPY (EGD) WITH ESOPHAGEAL DILATION (N/A) - 12:30-moved to 1130 [name] to notify pt as a surgical intervention .  The patient's history has been reviewed, patient examined, no change in status, stable for surgery.  I have reviewed the patient's chart and labs.  Questions were answered to the patient's satisfaction.     [name]  EGD with dilation as appropriate. Patient is unsure whether or not he got Protonix from drugstore.  The risks, benefits, limitations, alternatives and imponderables have been reviewed with the patient. Potential for esophageal dilation, biopsy, etc. have also been reviewed.  Questions have been answered. All parties agreeable.

## 2012-09-12 NOTE — Op Note (Signed)
[facility] [address] [place], [address]   ENDOSCOPY PROCEDURE REPORT  PATIENT: [name], [name]  MR#: [id number] BIRTHDATE: March 10, 1956 , 57  yrs. old GENDER: Male ENDOSCOPIST: R.  Roetta Sessions, MD FACP FACG REFERRED BY:  [name], M.D. PROCEDURE DATE:  09/12/2012 PROCEDURE:     EGD with Elease Hashimoto dilation followed by gastric biopsy  INDICATIONS:     Dyspepsia; early satiety; weight loss; recurrent esophageal dysphagia  INFORMED CONSENT:   The risks, benefits, limitations, alternatives and imponderables have been discussed.  The potential for biopsy, esophogeal dilation, etc. have also been reviewed.  Questions have been answered.  All parties agreeable.  Please see the history and physical in the medical record for more information.  MEDICATIONS:     Versed 4 mg IV and Demerol 75 mg IV in divided doses. Phenergan 25 mg IV and Zofran 4 mg. Cetacaine spray.  DESCRIPTION OF PROCEDURE:   The EG-[id number] ([id number])  endoscope was introduced through the mouth and advanced to the second portion of the duodenum without difficulty or limitations.  The mucosal surfaces were surveyed very carefully during advancement of the scope and upon withdrawal.  Retroflexion view of the proximal stomach and esophagogastric junction was performed.      FINDINGS: Noncritical Schatzki's ring; otherwise widely patent, normal-appearing tubular esophagus. Stomach empty. Small hiatal hernia. Multiple antral erosions. No ulcer or infiltrating process. Patent pylorus. Normal first and second portion of the duodenum.  THERAPEUTIC / DIAGNOSTIC MANEUVERS PERFORMED:  A 56 French Maloney dilator was passed to full insertion easily. A look back revealed no apparent complication related to this maneuver. Subsequently, biopsies of the abnormal-appearing antrum were taken for histologic study   COMPLICATIONS:  None  IMPRESSION:    Noncritical Schatzki's ring. Status post passage of  a Maloney dilator. Small hiatal hernia. Antral erosions-status post biopsy  RECOMMENDATIONS:   Continue Protonix 40 mg daily. Proceed with abdominal and pelvic contrast CT to further evaluate early satiety and weight loss.    _______________________________ R. Roetta Sessions, MD FACP [facility] eSigned:  R. Roetta Sessions, MD FACP [facility] 09/12/2012 12:36 PM     CC:

## 2012-09-14 ENCOUNTER — Ambulatory Visit (HOSPITAL_COMMUNITY)
Admission: RE | Admit: 2012-09-14 | Discharge: 2012-09-14 | Disposition: A | Discharge status: Home / Self Care | Payer: BC Managed Care – PPO | Source: Ambulatory Visit | Attending: Internal Medicine | Admitting: Internal Medicine

## 2012-09-14 DIAGNOSIS — R6881 Early satiety: Secondary | ICD-10-CM

## 2012-09-14 DIAGNOSIS — R634 Abnormal weight loss: Secondary | ICD-10-CM | POA: Insufficient documentation

## 2012-09-14 DIAGNOSIS — K838 Other specified diseases of biliary tract: Secondary | ICD-10-CM | POA: Insufficient documentation

## 2012-09-14 MED ORDER — IOHEXOL 300 MG/ML  SOLN
100.0000 mL | Freq: Once | INTRAMUSCULAR | Status: AC | PRN
  Administered 2012-09-14: 100 mL via INTRAVENOUS

## 2012-09-15 ENCOUNTER — Other Ambulatory Visit (HOSPITAL_COMMUNITY): Payer: Self-pay

## 2012-09-15 ENCOUNTER — Encounter: Payer: Self-pay | Admitting: Internal Medicine

## 2012-09-18 ENCOUNTER — Encounter (HOSPITAL_COMMUNITY): Payer: Self-pay | Admitting: Internal Medicine

## 2012-09-20 ENCOUNTER — Ambulatory Visit (INDEPENDENT_AMBULATORY_CARE_PROVIDER_SITE_OTHER): Payer: BC Managed Care – PPO | Admitting: Psychiatry

## 2012-09-20 ENCOUNTER — Encounter (HOSPITAL_COMMUNITY): Payer: Self-pay | Admitting: Psychiatry

## 2012-09-20 VITALS — Wt 162.8 lb

## 2012-09-20 DIAGNOSIS — J449 Chronic obstructive pulmonary disease, unspecified: Secondary | ICD-10-CM

## 2012-09-20 DIAGNOSIS — M79609 Pain in unspecified limb: Secondary | ICD-10-CM

## 2012-09-20 DIAGNOSIS — F411 Generalized anxiety disorder: Secondary | ICD-10-CM

## 2012-09-20 DIAGNOSIS — F418 Other specified anxiety disorders: Secondary | ICD-10-CM

## 2012-09-20 DIAGNOSIS — F332 Major depressive disorder, recurrent severe without psychotic features: Secondary | ICD-10-CM

## 2012-09-20 DIAGNOSIS — F5105 Insomnia due to other mental disorder: Secondary | ICD-10-CM | POA: Insufficient documentation

## 2012-09-20 DIAGNOSIS — F17200 Nicotine dependence, unspecified, uncomplicated: Secondary | ICD-10-CM

## 2012-09-20 MED ORDER — OLANZAPINE 5 MG PO TABS: 5.0000 mg | ORAL_TABLET | Freq: Every day | ORAL | Status: DC

## 2012-09-20 MED ORDER — GABAPENTIN 300 MG PO CAPS: 600.0000 mg | ORAL_CAPSULE | Freq: Four times a day (QID) | ORAL | Status: DC

## 2012-09-20 MED ORDER — ALPRAZOLAM 1 MG PO TABS: 1.0000 mg | ORAL_TABLET | Freq: Three times a day (TID) | ORAL | Status: DC | PRN

## 2012-09-20 NOTE — Progress Notes (Signed)
[facility] [address] Progress Note [name] MRN: [id number] DOB: Dec 23, 1955 Age: 57 y.o.  Date: 09/20/2012 Start Time: 9:27 AM End Time: 9:49 AM  Chief Complaint: Chief Complaint  Patient presents with  . Depression  . Anxiety  . Follow-up  . Medication Refill    Subjective:  "I'm down to a pack of cigarettes lasting 4 or 5 days.  I'm breathing a lot better.". Depression 5 or 6/10 and Anxiety 5 or 6/10, where 0 is none and 10 is the worst.  Pain is 2 or 3/10 with his legs.  Pt returns for follow up appointment.  Pt reports that he is compliant with the psychotropic medications with pretty good benefit and no noticeable side effects.  He is noting less anxiety and is able to cut his smoking down to one pack lasting 4 or 5 days.  He is using his grand daughter time as a way to help distract him from smoking.  Encourage him to keep that trend up and get ready for his grand daughter walking.    He is walking 3 or 4 times a day.  Diagnosis:   Axis I: Anxiety Disorder NOS and Major Depression, Recurrent severe Axis II: Deferred Axis III:  Past Medical History  Diagnosis Date  . ASCVD (arteriosclerotic cardiovascular disease)     inferior MI with circumflex PTCA in 1993;cath in 2000-50% OM 1&2,30% LAD ;2008-25% LAD, 80% nondominant right ,60% small OM 2,EF of 55% with severe basilar inferior hypokinesis; stress nuclear study in 04/2008 moderate inferior scar with peri-infarction ischemic  . Syncope   . Hyperlipidemia   . Hypertension   . Tobacco abuse     50 pack years continuing at one halp pack daily  . COPD (chronic obstructive pulmonary disease)     mild ;excercise induced hypoxemia by cp stress test ;asthma ,bronchitis,  . Restless leg syndrome   . Anxiety and depression   . GERD (gastroesophageal reflux disease)   . Inflammatory polyps of colon with rectal bleeding   . Carcinoma in situ of colon 2004    rectal polyp  . H. pylori infection 2004    treated  .  Sleep apnea     does not use CPAP  . Gastritis 12/30/10    EGD Dr Jena Gauss  . Colitis, ischemic 2011  . Tubular adenoma of colon 06/2009    Colonosocpy Dr Jena Gauss  . Leukocytosis     Dr Shon Baton  . Depression   . Anxiety   . Lung nodule 07/16/2011   Axis IV: other psychosocial or environmental problems Axis V: 41-50 serious symptoms  ADL's:  Intact  Sleep: Fair  Appetite:  Poor  Suicidal Ideation:  Pt has thoughts that are off and on, but not too bad lately Homicidal Ideation:  Pt has some struggles with his daughter's boy friend.  He stays away from him. AEB (as evidenced by):per pt report  Psychiatric Specialty Exam: ROS Neuro: no headaches, ataxia, weakness, gives out easily GI: no N/V/cramps/constipation, he keeps diarrhea for the past 6 or 7 years MS: no weakness or aches. He catches cramps really bad.   Weight 162 lb 12.8 oz (73.846 kg).Body mass index is 22.72 kg/(m^2).  General Appearance: Casual  Eye Contact::  Fair  Speech:  Clear and Coherent  Volume:  Normal  Mood:  Anxious  Affect:  Congruent  Thought Process:  Coherent, Linear and Logical  Orientation:  Full (Time, Place, and Person)  Thought Content:  Hallucinations: grief  Suicidal  Thoughts:  Yes.  without intent/plan  Homicidal Thoughts:  No  Memory:  Immediate;   Fair Recent;   Fair Remote;   Fair  Judgement:  Fair  Insight:  Fair  Psychomotor Activity:  Normal  Concentration:  Fair  Recall:  Fair  Akathisia:  No  Handed:  Right  AIMS (if indicated):     Assets:  Communication Skills Desire for Improvement  Sleep:      Current Medications: Zyprexa 5 mg at bed time Xanax 1 mg TID Neurontin 300 mg TID by another provider  Lab Results: No results found for this or any previous visit (from the past 48 hour(s)). Cardiologist draws periodic labs.  He has not heard back on the last set.  They usually tell him if there are concerns.  Physical Findings: AIMS:  , ,  ,  ,    CIWA:    COWS:      Treatment Plan Summary: Medication management  Plan: I took his vitals.  I reviewed CC, tobacco/med/surg Hx, meds effects/ side effects, problem list, therapies and responses as well as current situation/symptoms discussed options. Try higher dose  Neurontin for the anxiety and the pain management. See orders and pt instructions for more details.  MEDICATIONS this encounter: Meds ordered this encounter  Medications  . OLANZapine (ZYPREXA) 5 MG tablet    Sig: Take 1 tablet (5 mg total) by mouth at bedtime.    Dispense:  30 tablet    Refill:  1  . gabapentin (NEURONTIN) 300 MG capsule    Sig: Take 2 capsules (600 mg total) by mouth 4 (four) times daily.    Dispense:  240 capsule    Refill:  1  . ALPRAZolam (XANAX) 1 MG tablet    Sig: Take 1 tablet (1 mg total) by mouth 3 (three) times daily as needed for anxiety. For anxiety    Dispense:  90 tablet    Refill:  1    Medical Decision Making Problem Points:  Established problem, stable/improving (1), Review of last therapy session (1) and Review of psycho-social stressors (1) Data Points:  Review or order clinical lab tests (1) Review of medication regiment & side effects (2) Review of new medications or change in dosage (2)  I certify that outpatient services furnished can reasonably be expected to improve the patient's condition.   Shenae Bonanno 09/20/2012, 9:48 AM

## 2012-09-20 NOTE — Patient Instructions (Signed)
Set a timer for 8 minutes and walk for that amount of time in the house or in the yard.  Mark "8" on a calendar for that day.  Do that every day this week.  Then next week increase the time to 9 minutes and then mark the calendar with a 9 for that day.  Each week increase your exercise by one minute.  Keep a record of this so you can see what progress you are making.  Do this every day, just like eating and sleeping.  It is good for pain control, depression, and for your soul/spirit.  Bring the record in for your next visit so we can talk about your effort and how you feel with the new exercise program going and working for you.  Take care of yourself.  No one else is standing up to do the job and only you know what you need.   GET SERIOUS about taking care of yourself.  Do the next right thing and that often means doing something to care for yourself along the lines of are you hungry, are you angry, are you lonely, are you tired, are you scared?  [employer] is what that stands for.  Call if problems or concerns.  

## 2012-09-25 ENCOUNTER — Other Ambulatory Visit (HOSPITAL_COMMUNITY): Payer: Self-pay | Admitting: Family Medicine

## 2012-09-25 NOTE — Telephone Encounter (Signed)
Refill on Norco last seen March 12,2014 for follow up

## 2012-09-25 NOTE — Telephone Encounter (Signed)
RX called in on pharmacy voicemail 

## 2012-09-25 NOTE — Telephone Encounter (Signed)
May refill this and 1 additional refill

## 2012-10-14 ENCOUNTER — Encounter: Payer: Self-pay | Admitting: *Deleted

## 2012-10-16 ENCOUNTER — Other Ambulatory Visit (HOSPITAL_COMMUNITY): Payer: Self-pay

## 2012-10-16 ENCOUNTER — Other Ambulatory Visit (HOSPITAL_COMMUNITY): Payer: Self-pay | Admitting: Oncology

## 2012-10-16 ENCOUNTER — Telehealth (HOSPITAL_COMMUNITY): Payer: Self-pay

## 2012-10-16 ENCOUNTER — Encounter (HOSPITAL_COMMUNITY): Payer: BC Managed Care – PPO | Attending: Oncology

## 2012-10-16 DIAGNOSIS — J449 Chronic obstructive pulmonary disease, unspecified: Secondary | ICD-10-CM | POA: Insufficient documentation

## 2012-10-16 DIAGNOSIS — F17200 Nicotine dependence, unspecified, uncomplicated: Secondary | ICD-10-CM | POA: Insufficient documentation

## 2012-10-16 DIAGNOSIS — D72829 Elevated white blood cell count, unspecified: Secondary | ICD-10-CM | POA: Insufficient documentation

## 2012-10-16 DIAGNOSIS — J4489 Other specified chronic obstructive pulmonary disease: Secondary | ICD-10-CM | POA: Insufficient documentation

## 2012-10-16 LAB — COMPREHENSIVE METABOLIC PANEL
AST: 16 U/L (ref 0–37)
Albumin: 3.8 g/dL (ref 3.5–5.2)
Alkaline Phosphatase: 86 U/L (ref 39–117)
CO2: 26 mEq/L (ref 19–32)
Chloride: 102 mEq/L (ref 96–112)
GFR calc non Af Amer: >90 mL/min (ref >90)
Potassium: 3.2 mEq/L — ABNORMAL LOW (ref 3.5–5.1)
Total Bilirubin: 0.3 mg/dL (ref 0.3–1.2)

## 2012-10-16 LAB — DIFFERENTIAL
Basophils Absolute: 0 10*3/uL (ref 0.0–0.1)
Basophils Relative: 0 % (ref 0–1)
Eosinophils Absolute: 0.1 10*3/uL (ref 0.0–0.7)
Eosinophils Relative: 1 % (ref 0–5)
Monocytes Absolute: 0.3 10*3/uL (ref 0.1–1.0)
Monocytes Relative: 4 % (ref 3–12)

## 2012-10-16 LAB — CBC
HCT: 45.1 % (ref 39.0–52.0)
MCV: 87.2 fL (ref 78.0–100.0)
Platelets: 182 10*3/uL (ref 150–400)
RBC: 5.17 MIL/uL (ref 4.22–5.81)
RDW: 13.6 % (ref 11.5–15.5)
WBC: 7.6 10*3/uL (ref 4.0–10.5)

## 2012-10-16 MED ORDER — POTASSIUM CHLORIDE CRYS ER 20 MEQ PO TBCR: EXTENDED_RELEASE_TABLET | ORAL | Status: DC

## 2012-10-16 NOTE — Progress Notes (Signed)
Labs drawn today for cbc/diff,cmp 

## 2012-10-16 NOTE — Telephone Encounter (Signed)
Spoke with Mrs. Sculley - states that he is not on any diuretics, nor has he had any vomiting or diarrhea.  Stated that his bowels are loose at times but that this was normal for him.  Kdur e-scribed and instructions given.  Verbalized understanding of instructions.

## 2012-10-16 NOTE — Telephone Encounter (Signed)
Not on any potassium.  Uses [facility] in [place].

## 2012-10-16 NOTE — Telephone Encounter (Signed)
Message copied by Evelena Leyden on Mon Oct 16, 2012  4:59 PM ------      Message from: Mariel Sleet, ERIC S      Created: Mon Oct 16, 2012  3:35 PM       K-dur 20 meq bid for 7 days then one a day until gone#60            Check with him(any diuretics-I don't see any on his med list) or diarrhea or vomiting? ------

## 2012-10-16 NOTE — Telephone Encounter (Signed)
Message copied by Evelena Leyden on Mon Oct 16, 2012  3:15 PM ------      Message from: Mariel Sleet, ERIC S      Created: Mon Oct 16, 2012  1:20 PM       K+ low      On any K+/ ------

## 2012-10-17 ENCOUNTER — Telehealth (HOSPITAL_COMMUNITY): Payer: Self-pay

## 2012-10-17 ENCOUNTER — Other Ambulatory Visit (HOSPITAL_COMMUNITY): Payer: Self-pay

## 2012-10-17 DIAGNOSIS — E876 Hypokalemia: Secondary | ICD-10-CM

## 2012-10-17 NOTE — Telephone Encounter (Signed)
Spoke with patient and he is picking up potassium today.  Will return on 6/10 for recheck of BMET.  Verbalized understanding of instructions.

## 2012-10-17 NOTE — Telephone Encounter (Signed)
Message copied by Evelena Leyden on Tue Oct 17, 2012 10:53 AM ------      Message from: Randall An      Created: Tue Oct 17, 2012  9:42 AM       bmet      ----- Message -----         From: Evelena Leyden, RN         Sent: 10/17/2012   9:38 AM           To: Randall An, MD            Rx was escribed for K 20 BID x7 days then daily til gone (#60).      Do you want a BMET or just K in 4 - 6 weeks.?      ----- Message -----         From: Randall An, MD         Sent: 10/17/2012   8:48 AM           To: Evelena Leyden, RN            Did we set up K+ f/u in 4-6 weeks?      If not let's do             ------

## 2012-10-18 ENCOUNTER — Encounter (HOSPITAL_COMMUNITY): Payer: Self-pay | Admitting: Oncology

## 2012-10-18 ENCOUNTER — Encounter (HOSPITAL_BASED_OUTPATIENT_CLINIC_OR_DEPARTMENT_OTHER): Payer: BC Managed Care – PPO | Admitting: Oncology

## 2012-10-18 VITALS — BP 124/83 | HR 71 | Temp 97.5°F | Resp 18 | Wt 162.6 lb

## 2012-10-18 DIAGNOSIS — F17200 Nicotine dependence, unspecified, uncomplicated: Secondary | ICD-10-CM

## 2012-10-18 DIAGNOSIS — J449 Chronic obstructive pulmonary disease, unspecified: Secondary | ICD-10-CM

## 2012-10-18 DIAGNOSIS — D72829 Elevated white blood cell count, unspecified: Secondary | ICD-10-CM

## 2012-10-18 DIAGNOSIS — E876 Hypokalemia: Secondary | ICD-10-CM

## 2012-10-18 DIAGNOSIS — R911 Solitary pulmonary nodule: Secondary | ICD-10-CM

## 2012-10-18 NOTE — Progress Notes (Signed)
#  1. History leukocytosis, no longer an issue #2 COPD with a long-standing smoking history and a lung nodule it appears benign thus far. Followup CT of the chest is scheduled for late October 2014 which will be one year from his last CTs. Unfortunately he still smoking one third of a pack a day. He is going to try the Nicorette gum lozenges. We talked extensively about the importance of cessation of smoking. It may well been the cause of minimal erythrocytosis in the past as well.  He also has hypokalemia unexplained. He is nonobvious diuretic but we will call the pharmacy to verify that.  We agree placed him with potassium orally we will check him again in 4-5 weeks to make sure he is adequately replaced. We'll also get check with the pharmacy and then see him back after the CAT scans. We spent essentially all this time in counseling of his smoking cessation needs and followup of the lung issue .

## 2012-10-18 NOTE — Patient Instructions (Addendum)
[facility] Discharge Instructions  RECOMMENDATIONS MADE BY THE CONSULTANT AND ANY TEST RESULTS WILL BE SENT TO YOUR REFERRING PHYSICIAN.  EXAM FINDINGS BY THE PHYSICIAN TODAY AND SIGNS OR SYMPTOMS TO REPORT TO CLINIC OR PRIMARY PHYSICIAN: Exam and discussion by MD.  Need to stop smoking.  MEDICATIONS PRESCRIBED:  Continue the potassium as ordered.  INSTRUCTIONS GIVEN AND DISCUSSED: Scans as scheduled in October Report recurring infections, fevers, etc.  SPECIAL INSTRUCTIONS/FOLLOW-UP:Blood work in June and October.  CT scan in October and to see PA after scans in October.   Thank you for choosing [facility] to provide your oncology and hematology care.  To afford each patient quality time with our providers, please arrive at least 15 minutes before your scheduled appointment time.  With your help, our goal is to use those 15 minutes to complete the necessary work-up to ensure our physicians have the information they need to help with your evaluation and healthcare recommendations.    Effective January 1st, 2014, we ask that you re-schedule your appointment with our physicians should you arrive 10 or more minutes late for your appointment.  We strive to give you quality time with our providers, and arriving late affects you and other patients whose appointments are after yours.    Again, thank you for choosing [facility].  Our hope is that these requests will decrease the amount of time that you wait before being seen by our physicians.       _____________________________________________________________  Should you have questions after your visit to [facility], please contact our office at [phone] between the hours of 8:30 a.m. and 5:00 p.m.  Voicemails left after 4:30 p.m. will not be returned until the following business day.  For prescription refill requests, have your pharmacy contact our office with your prescription  refill request.

## 2012-10-23 ENCOUNTER — Telehealth: Payer: Self-pay | Admitting: Gastroenterology

## 2012-10-23 NOTE — Telephone Encounter (Signed)
Stool studies negative.  How is patient?

## 2012-10-25 NOTE — Telephone Encounter (Signed)
Spoke with pt- he is doing ok, still doesn't have an appitite and still has diarrhea 2-3x a day. He does not have any pain.

## 2012-10-25 NOTE — Telephone Encounter (Signed)
Let's have him take imodium 1 tablet each morning prn.  Return for follow-up in 2-4 weeks.

## 2012-10-26 NOTE — Telephone Encounter (Signed)
Pt aware. Darl Pikes, please schedule appt. I told him it would be about 4 weeks.

## 2012-11-02 ENCOUNTER — Encounter: Payer: Self-pay | Admitting: Gastroenterology

## 2012-11-02 NOTE — Telephone Encounter (Signed)
Pt is aware of OV on 6/9 at 1030 with AS and appt card was mailed

## 2012-11-16 ENCOUNTER — Encounter: Payer: Self-pay | Admitting: Internal Medicine

## 2012-11-20 ENCOUNTER — Ambulatory Visit (INDEPENDENT_AMBULATORY_CARE_PROVIDER_SITE_OTHER): Payer: BC Managed Care – PPO | Admitting: Psychiatry

## 2012-11-20 ENCOUNTER — Telehealth: Payer: Self-pay | Admitting: Gastroenterology

## 2012-11-20 ENCOUNTER — Encounter (HOSPITAL_COMMUNITY): Payer: Self-pay | Admitting: Psychiatry

## 2012-11-20 ENCOUNTER — Ambulatory Visit: Payer: Self-pay | Admitting: Gastroenterology

## 2012-11-20 VITALS — BP 136/87 | HR 61 | Ht 69.75 in | Wt 161.8 lb

## 2012-11-20 DIAGNOSIS — F5105 Insomnia due to other mental disorder: Secondary | ICD-10-CM

## 2012-11-20 DIAGNOSIS — M79609 Pain in unspecified limb: Secondary | ICD-10-CM

## 2012-11-20 DIAGNOSIS — F411 Generalized anxiety disorder: Secondary | ICD-10-CM

## 2012-11-20 DIAGNOSIS — F332 Major depressive disorder, recurrent severe without psychotic features: Secondary | ICD-10-CM

## 2012-11-20 DIAGNOSIS — F17200 Nicotine dependence, unspecified, uncomplicated: Secondary | ICD-10-CM

## 2012-11-20 DIAGNOSIS — F418 Other specified anxiety disorders: Secondary | ICD-10-CM

## 2012-11-20 MED ORDER — GABAPENTIN 300 MG PO CAPS: 600.0000 mg | ORAL_CAPSULE | Freq: Three times a day (TID) | ORAL | Status: DC

## 2012-11-20 MED ORDER — OLANZAPINE 5 MG PO TABS: 5.0000 mg | ORAL_TABLET | Freq: Every day | ORAL | Status: DC

## 2012-11-20 MED ORDER — ALPRAZOLAM 1 MG PO TABS: 1.0000 mg | ORAL_TABLET | Freq: Three times a day (TID) | ORAL | Status: DC | PRN

## 2012-11-20 NOTE — Telephone Encounter (Signed)
Looks like he saw [employer] today. Please send letter for f/u.

## 2012-11-20 NOTE — Progress Notes (Signed)
[facility] [address] Progress Note [name] MRN: [id number] DOB: 11-15-1955 Age: 57 y.o.  Date: 11/20/2012 Start Time: 9:50 AM End Time: 10:05 AM  Chief Complaint: Chief Complaint  Patient presents with  . Anxiety  . Depression  . Follow-up  . Medication Refill    Subjective:  "I'm down to a pack of cigarettes lasting a week  I'm breathing a lot better.". Depression 5/10 and Anxiety 4/10, where 0 is none and 10 is the worst.  Pain is 2 or 3/10 with his legs.  Pt returns for follow up appointment.  Pt reports that he is compliant with the psychotropic medications with good benefit and no noticeable side effects.  He is noting less anxiety and is able to cut his smoking down to one pack lasting a week.  He is using his grand daughter time as a way to help distract him from smoking.  He is also going for walks and watching the river.  The increase in Neurontin helped with the anxiety.  And his sleep is better too.  With the assistance in controlling his anxiety with the increased Neurontin, he continues to cut back on his smoking.  He is walking 3 or 4 times a day.  Diagnosis:   Axis I: Anxiety Disorder NOS and Major Depression, Recurrent severe Axis II: Deferred Axis III:  Past Medical History  Diagnosis Date  . ASCVD (arteriosclerotic cardiovascular disease)     inferior MI with circumflex PTCA in 1993;cath in 2000-50% OM 1&2,30% LAD ;2008-25% LAD, 80% nondominant right ,60% small OM 2,EF of 55% with severe basilar inferior hypokinesis; stress nuclear study in 04/2008 moderate inferior scar with peri-infarction ischemic  . Syncope   . Hyperlipidemia   . Hypertension   . Tobacco abuse     50 pack years continuing at one halp pack daily  . COPD (chronic obstructive pulmonary disease)     mild ;excercise induced hypoxemia by cp stress test ;asthma ,bronchitis,  . Restless leg syndrome   . Anxiety and depression   . GERD (gastroesophageal reflux disease)   .  Inflammatory polyps of colon with rectal bleeding   . Carcinoma in situ of colon 2004    rectal polyp  . H. pylori infection 2004    treated  . Sleep apnea     does not use CPAP  . Gastritis 12/30/10    EGD Dr Jena Gauss  . Colitis, ischemic 2011  . Tubular adenoma of colon 06/2009    Colonosocpy Dr Jena Gauss  . Leukocytosis     Dr Shon Baton  . Depression   . Anxiety   . Lung nodule 07/16/2011  . CAD (coronary artery disease)    Axis IV: other psychosocial or environmental problems Axis V: 41-50 serious symptoms  ADL's:  Intact  Sleep: Fair  Appetite:  Poor  Allergies: No Known Allergies Medical History: Past Medical History  Diagnosis Date  . ASCVD (arteriosclerotic cardiovascular disease)     inferior MI with circumflex PTCA in 1993;cath in 2000-50% OM 1&2,30% LAD ;2008-25% LAD, 80% nondominant right ,60% small OM 2,EF of 55% with severe basilar inferior hypokinesis; stress nuclear study in 04/2008 moderate inferior scar with peri-infarction ischemic  . Syncope   . Hyperlipidemia   . Hypertension   . Tobacco abuse     50 pack years continuing at one halp pack daily  . COPD (chronic obstructive pulmonary disease)     mild ;excercise induced hypoxemia by cp stress test ;asthma ,bronchitis,  . Restless  leg syndrome   . Anxiety and depression   . GERD (gastroesophageal reflux disease)   . Inflammatory polyps of colon with rectal bleeding   . Carcinoma in situ of colon 2004    rectal polyp  . H. pylori infection 2004    treated  . Sleep apnea     does not use CPAP  . Gastritis 12/30/10    EGD Dr Jena Gauss  . Colitis, ischemic 2011  . Tubular adenoma of colon 06/2009    Colonosocpy Dr Jena Gauss  . Leukocytosis     Dr Shon Baton  . Depression   . Anxiety   . Lung nodule 07/16/2011  . CAD (coronary artery disease)    Surgical History: Past Surgical History  Procedure Laterality Date  . Colonoscopy w/ polypectomy  2004    rectal polyp with carcinoma in situ removed via  colonoscopy  . Hand surgery      surgical intervention for injury of the fingers of the left hand many years ago  . Colonoscopy  06/2009    normal terminal ileum, segmental mild inflammation of sigmoid colon (bx unremarkable), polyp, tubular adenoma  . Esophagogastroduodenoscopy  02/2009    query Barrett's but bx negative  . Esophagogastroduodenoscopy  12/30/2010    [name],gastritis, dilated 58F, sm HH, 1 small ulcer, Duodenal erosions, benign bx  . Flexible sigmoidoscopy  12/30/2010     [name],; internal hemorrhoids, anal papilla  . Heart stent    . Nasal septoplasty w/ turbinoplasty  10/06/2011    Procedure: NASAL SEPTOPLASTY WITH TURBINATE REDUCTION;  Surgeon: Serena Colonel, MD;  Location: [facility] OR;  Service: ENT;  Laterality: Bilateral;  . Cardiac catheterization    . Coronary angioplasty with stent placement  01/26/2012    "1; total is now 2"  . Esophagogastroduodenoscopy (egd) with esophageal dilation N/A 09/12/2012    [id number] Schatzki's ring s/p Maloney dilator. Small hiatal hernia  . Cholecystectomy  2004   Family History: family history includes Cancer in his maternal aunt and paternal uncle; Depression in his mother; Heart disease in his mother; Hypertension in his brother; Kidney failure in his maternal uncle; and Lung disease in his father.  There is no history of Colon cancer, and ADD / ADHD, and Alcohol abuse, and Drug abuse, and Anxiety disorder, and Bipolar disorder, and Dementia, and OCD, and Paranoid behavior, and Schizophrenia, and Physical abuse, and Sexual abuse, and Seizures, . Reviewed and nothing new today.  Suicidal Ideation:  Pt has thoughts that are off and on, but not too bad lately Homicidal Ideation:  Pt has some struggles with his daughter's boy friend.  He stays away from him. AEB (as evidenced by):per pt report  Psychiatric Specialty Exam: ROS Neuro: no headaches, ataxia, weakness, gives out easily GI: no N/V/cramps/constipation, he keeps  diarrhea for the past 6 or 7 years MS: no weakness or aches. He catches cramps really bad.   Blood pressure 136/87, pulse 61, height 5' 9.75" (1.772 m), weight 161 lb 12.8 oz (73.392 kg).Body mass index is 23.37 kg/(m^2).  General Appearance: Casual  Eye Contact::  Fair  Speech:  Clear and Coherent  Volume:  Normal  Mood:  Anxious  Affect:  Congruent  Thought Process:  Coherent, Linear and Logical  Orientation:  Full (Time, Place, and Person)  Thought Content:  Hallucinations: grief  Suicidal Thoughts:  Yes.  without intent/plan  Homicidal Thoughts:  No  Memory:  Immediate;   Fair Recent;   Fair Remote;   Fair  Judgement:  Fair  Insight:  Fair  Psychomotor Activity:  Normal  Concentration:  Fair  Recall:  Fair  Akathisia:  No  Handed:  Right  AIMS (if indicated):     Assets:  Communication Skills Desire for Improvement  Sleep:      Current Medications: Zyprexa 5 mg at bed time Xanax 1 mg TID Neurontin 300 mg TID by another provider  Lab Results: No results found for this or any previous visit (from the past 48 hour(s)). Cardiologist draws periodic labs.  He has not heard back on the last set.  They usually tell him if there are concerns.  Physical Findings: AIMS:  , ,  ,  ,    CIWA:    COWS:     Treatment Plan Summary: Medication management  Plan: I took his vitals.  I reviewed CC, tobacco/med/surg Hx, meds effects/ side effects, problem list, therapies and responses as well as current situation/symptoms discussed options. Continue current effective medications. See orders and pt instructions for more details.  MEDICATIONS this encounter: No orders of the defined types were placed in this encounter.    Medical Decision Making Problem Points:  Established problem, stable/improving (1), Review of last therapy session (1) and Review of psycho-social stressors (1) Data Points:  Review or order clinical lab tests (1) Review of medication regiment & side effects  (2)  I certify that outpatient services furnished can reasonably be expected to improve the patient's condition.   Charlee Squibb 11/20/2012, 9:54 AM

## 2012-11-20 NOTE — Telephone Encounter (Signed)
Pt was a no show

## 2012-11-21 ENCOUNTER — Encounter: Payer: Self-pay | Admitting: Gastroenterology

## 2012-11-21 ENCOUNTER — Other Ambulatory Visit (HOSPITAL_COMMUNITY): Payer: Self-pay | Admitting: Family Medicine

## 2012-11-21 ENCOUNTER — Other Ambulatory Visit (HOSPITAL_COMMUNITY): Payer: Self-pay

## 2012-11-21 NOTE — Telephone Encounter (Signed)
1 refill needs OV 

## 2012-11-21 NOTE — Telephone Encounter (Signed)
RX called into pharmacy

## 2012-11-21 NOTE — Telephone Encounter (Signed)
MAILED LETTER °

## 2012-12-08 ENCOUNTER — Other Ambulatory Visit: Payer: Self-pay | Admitting: Cardiology

## 2012-12-08 NOTE — Telephone Encounter (Signed)
Medication sent via escribe for metoprolol 

## 2012-12-27 ENCOUNTER — Encounter (HOSPITAL_COMMUNITY): Payer: BC Managed Care – PPO | Attending: Oncology

## 2012-12-27 DIAGNOSIS — E876 Hypokalemia: Secondary | ICD-10-CM | POA: Insufficient documentation

## 2012-12-27 LAB — BASIC METABOLIC PANEL
BUN: 9 mg/dL (ref 6–23)
CO2: 28 mEq/L (ref 19–32)
Calcium: 9.2 mg/dL (ref 8.4–10.5)
Chloride: 105 mEq/L (ref 96–112)
Creatinine, Ser: 0.8 mg/dL (ref 0.50–1.35)
Glucose, Bld: 145 mg/dL — ABNORMAL HIGH (ref 70–99)

## 2012-12-27 NOTE — Progress Notes (Unsigned)
[name] presented for labwork. Labs per MD order drawn via Peripheral Line 23 gauge needle inserted in RT AC  Good blood return present. Procedure without incident.  Needle removed intact. Patient tolerated procedure well.  Finished his Potassium tablets 2 weeks ago. Hasn't felt good for about 3 weeks. He gets tired very easily.

## 2013-01-09 ENCOUNTER — Encounter: Payer: Self-pay | Admitting: Family Medicine

## 2013-01-09 ENCOUNTER — Other Ambulatory Visit: Payer: Self-pay | Admitting: Gastroenterology

## 2013-01-09 ENCOUNTER — Ambulatory Visit (INDEPENDENT_AMBULATORY_CARE_PROVIDER_SITE_OTHER): Payer: BC Managed Care – PPO | Admitting: Family Medicine

## 2013-01-09 VITALS — BP 116/82 | Ht 68.75 in | Wt 159.4 lb

## 2013-01-09 DIAGNOSIS — Z0000 Encounter for general adult medical examination without abnormal findings: Secondary | ICD-10-CM

## 2013-01-09 DIAGNOSIS — E876 Hypokalemia: Secondary | ICD-10-CM

## 2013-01-09 DIAGNOSIS — R739 Hyperglycemia, unspecified: Secondary | ICD-10-CM

## 2013-01-09 DIAGNOSIS — E785 Hyperlipidemia, unspecified: Secondary | ICD-10-CM

## 2013-01-09 DIAGNOSIS — R7309 Other abnormal glucose: Secondary | ICD-10-CM

## 2013-01-09 MED ORDER — HYDROCODONE-ACETAMINOPHEN 7.5-325 MG PO TABS: 1.0000 | ORAL_TABLET | Freq: Three times a day (TID) | ORAL | Status: DC | PRN

## 2013-01-09 NOTE — Progress Notes (Signed)
  Subjective:    Patient ID: [name], male    DOB: October 10, 1955, 57 y.o.   MRN: [id number]  HPI this patient has an extensive history of COPD heart disease hyperlipidemia tobacco abuse he also has abnormal CT scan in he states he is scheduled for a followup CT scan in October. Patient denies any chest pressure tightness he does get shortness of breath with activity. He still has anxiety issues and sees psychiatry. Family history social history past medical history all reviewed  Review of Systems  Constitutional: Negative for fever, activity change and appetite change.  HENT: Negative for congestion, rhinorrhea and neck pain.   Eyes: Negative for discharge.  Respiratory: Negative for cough and wheezing.   Cardiovascular: Negative for chest pain.  Gastrointestinal: Negative for vomiting, abdominal pain and blood in stool.  Genitourinary: Negative for frequency and difficulty urinating.  Skin: Negative for rash.  Allergic/Immunologic: Negative for environmental allergies and food allergies.  Neurological: Negative for weakness and headaches.  Psychiatric/Behavioral: Negative for agitation.       Objective:   Physical Exam  Nursing note and vitals reviewed. Constitutional: He appears well-developed and well-nourished.  HENT:  Head: Normocephalic and atraumatic.  Right Ear: External ear normal.  Left Ear: External ear normal.  Nose: Nose normal.  Mouth/Throat: Oropharynx is clear and moist.  Eyes: EOM are normal. Pupils are equal, round, and reactive to light.  Neck: Normal range of motion. Neck supple. No thyromegaly present.  Cardiovascular: Normal rate, regular rhythm and normal heart sounds.   No murmur heard. Pulmonary/Chest: Effort normal and breath sounds normal. No respiratory distress. He has no wheezes.  Abdominal: Soft. Bowel sounds are normal. He exhibits no distension and no mass. There is no tenderness.  Genitourinary: Penis normal.  Musculoskeletal: Normal range  of motion. He exhibits no edema.  Lymphadenopathy:    He has no cervical adenopathy.  Neurological: He is alert. He exhibits normal muscle tone.  Skin: Skin is warm and dry. No erythema.  Psychiatric: He has a normal mood and affect. His behavior is normal. Judgment normal.          Assessment & Plan:  Wellness exam-overall this gentleman is doing good except I encouraged him strongly to quit smoking. He is going to use electronic cigarettes. That would be certainly better then using tobacco cigarettes. But in the long run quitting totally would be best. PSA ordered. Await results. Prostate exam normal. Patient up-to-date on colonoscopy is due in 2016  Heart disease stable  COPD stable  Chronic pain with the back and knees he uses pain medication no greater than 3 times a day prescription for #90 was given with 3 refills. He is to followup again in 4 months.  Followup CT scan in October he follows up with oncology at that time

## 2013-01-09 NOTE — Patient Instructions (Signed)
As part of your visit today we have covered chronic pain. You have been given prescription for pain medicines. Certain pain medications such as hydrocodone  allow for refills. Other pain medications such as oxycodone require separate prescriptions. Nonetheless, your expected to come in for a office visit before further pain medications are issued.   We will not refill medications or early nor will we give an extended month supply at the end of these prescriptions.It is your responsibility to keep up with medications. They will not be replaced.  It is your responsibility to schedule an office visit in 3-4 months to be seen before you are out of your medication. Do not call our office to request early refills or additional refills.   We are required by law to adhere to regulations. Failure on our part to follow these regulations could jeopardize our prescription license which in turn would cause [place] not to be able to care for you.     DASH Diet The DASH diet stands for "Dietary Approaches to Stop Hypertension." It is a healthy eating plan that has been shown to reduce high blood pressure (hypertension) in as little as 14 days, while also possibly providing other significant health benefits. These other health benefits include reducing the risk of breast cancer after menopause and reducing the risk of type 2 diabetes, heart disease, colon cancer, and stroke. Health benefits also include weight loss and slowing kidney failure in patients with chronic kidney disease.  DIET GUIDELINES  Limit salt (sodium). Your diet should contain less than 1500 mg of sodium daily.  Limit refined or processed carbohydrates. Your diet should include mostly whole grains. Desserts and added sugars should be used sparingly.  Include small amounts of heart-healthy fats. These types of fats include nuts, oils, and tub margarine. Limit saturated and trans fats. These fats have been shown to be harmful in the body. CHOOSING FOODS   The following food groups are based on a 2000 calorie diet. See your Registered Dietitian for individual calorie needs. Grains and Grain Products (6 to 8 servings daily)  Eat More Often: Whole-wheat bread, brown rice, whole-grain or wheat pasta, quinoa, popcorn without added fat or salt (air popped).  Eat Less Often: White bread, white pasta, white rice, cornbread. Vegetables (4 to 5 servings daily)  Eat More Often: Fresh, frozen, and canned vegetables. Vegetables may be raw, steamed, roasted, or grilled with a minimal amount of fat.  Eat Less Often/Avoid: Creamed or fried vegetables. Vegetables in a cheese sauce. Fruit (4 to 5 servings daily)  Eat More Often: All fresh, canned (in natural juice), or frozen fruits. Dried fruits without added sugar. One hundred percent fruit juice ( cup [237 mL] daily).  Eat Less Often: Dried fruits with added sugar. Canned fruit in light or heavy syrup. [employer], Fish, and Poultry (2 servings or less daily. One serving is 3 to 4 oz [85-114 g]).  Eat More Often: Ninety percent or leaner ground beef, tenderloin, sirloin. Round cuts of beef, chicken breast, [place] breast. All fish. Grill, bake, or broil your meat. Nothing should be fried.  Eat Less Often/Avoid: Fatty cuts of meat, [place], or chicken leg, thigh, or wing. Fried cuts of meat or fish. Dairy (2 to 3 servings)  Eat More Often: Low-fat or fat-free milk, low-fat plain or light yogurt, reduced-fat or part-skim cheese.  Eat Less Often/Avoid: Milk (whole, 2%).Whole milk yogurt. Full-fat cheeses. Nuts, Seeds, and Legumes (4 to 5 servings per week)  Eat More Often: All  without added salt.  Eat Less Often/Avoid: Salted nuts and seeds, canned beans with added salt. Fats and Sweets (limited)  Eat More Often: Vegetable oils, tub margarines without trans fats, sugar-free gelatin. Mayonnaise and salad dressings.  Eat Less Often/Avoid: Coconut oils, palm oils, butter, stick margarine, cream, half  and half, cookies, candy, pie. FOR MORE INFORMATION The Dash Diet Eating Plan: www.dashdiet.org Document Released: 05/20/2011 Document Revised: 08/23/2011 Document Reviewed: 05/20/2011 [facility] Patient Information 2014 [place], [place].

## 2013-01-11 LAB — BASIC METABOLIC PANEL
Calcium: 9.2 mg/dL (ref 8.4–10.5)
Glucose, Bld: 90 mg/dL (ref 70–99)
Sodium: 142 mEq/L (ref 135–145)

## 2013-01-11 LAB — LIPID PANEL
HDL: 32 mg/dL — ABNORMAL LOW (ref >39)
Total CHOL/HDL Ratio: 4.6 Ratio
VLDL: 25 mg/dL (ref 0–40)

## 2013-01-11 LAB — PSA: PSA: 0.94 ng/mL (ref <=4.00)

## 2013-01-12 ENCOUNTER — Encounter: Payer: Self-pay | Admitting: Family Medicine

## 2013-01-31 ENCOUNTER — Encounter: Payer: Self-pay | Admitting: Adult Health

## 2013-01-31 ENCOUNTER — Ambulatory Visit (INDEPENDENT_AMBULATORY_CARE_PROVIDER_SITE_OTHER): Payer: BC Managed Care – PPO | Admitting: Adult Health

## 2013-01-31 VITALS — BP 141/86 | HR 63 | Ht 70.0 in | Wt 165.8 lb

## 2013-01-31 DIAGNOSIS — R195 Other fecal abnormalities: Secondary | ICD-10-CM

## 2013-01-31 DIAGNOSIS — F17200 Nicotine dependence, unspecified, uncomplicated: Secondary | ICD-10-CM

## 2013-01-31 DIAGNOSIS — I10 Essential (primary) hypertension: Secondary | ICD-10-CM

## 2013-01-31 DIAGNOSIS — G4733 Obstructive sleep apnea (adult) (pediatric): Secondary | ICD-10-CM

## 2013-01-31 DIAGNOSIS — I2510 Atherosclerotic heart disease of native coronary artery without angina pectoris: Secondary | ICD-10-CM

## 2013-01-31 NOTE — Assessment & Plan Note (Signed)
He is complaining of bloody stools, almost every other day. He has chronic diarrhea. He plans to see GI soon,  I have advised him to go to ER if he continues to have bleeding or significant melena for admission.

## 2013-01-31 NOTE — Patient Instructions (Addendum)
Your physician recommends that you schedule a follow-up appointment in: 6 MONTHS You will receive a reminder letter two months in advance reminding you to call and schedule your appointment. If you don't receive this letter, please contact our office.  Your physician recommends that you continue on your current medications as directed. Please refer to the Current Medication list given to you today.

## 2013-01-31 NOTE — Assessment & Plan Note (Signed)
Good control of BP at this time. Continue current medications.

## 2013-01-31 NOTE — Progress Notes (Signed)
HPI: [name] is a 57 year old patient of Dr. Dietrich Pates we are following for ongoing assessment and management of CAD, PCI to the RCA using a bare-metal stent in 2012, hypercholesterolemia, and COPD with ongoing tobacco abuse. He was last seen in the office on 08/03/2012. On that visit the patient was doing well without cardiac complaint. It was noted that he was on both Pravachol and atorvastatin, the atorvastatin was discontinued he was continued on Pravachol, lipids and LFTs in a BMET were ordered. He also been having a lot of diarrhea on Prilosec and I changed this to Pepcid.   Study was found to be 142 potassium 3.9 chloride 106 CO2 27 BUN 8 creatinine 0.82 with a glucose of 90. Total cholesterol was 148 triglycerides 127 HDL 32 LDL 91. Hemoglobin A1c was 4.8.   He comes today with complaints of fatigue, with bloody stools described as bright red. Denies hemoptysis, abdominal pain. His wife is making appt with Dr. Lovena Neighbours for GID follow up. He denies chest pain of dyspnea. He is medically compliant.    No Known Allergies  Current Outpatient Prescriptions  Medication Sig Dispense Refill  . albuterol (PROVENTIL) (2.5 MG/3ML) 0.083% nebulizer solution Take 2.5 mg by nebulization every 6 (six) hours as needed. For shortness of breath      . albuterol-ipratropium (COMBIVENT) 18-103 MCG/ACT inhaler Inhale 2 puffs into the lungs every 6 (six) hours as needed. For shortness of breath      . ALPRAZolam (XANAX) 1 MG tablet Take 1 tablet (1 mg total) by mouth 3 (three) times daily as needed for anxiety. For anxiety  90 tablet  2  . aspirin EC 81 MG tablet Take 81 mg by mouth daily.       . budesonide-formoterol (SYMBICORT) 80-4.5 MCG/ACT inhaler Inhale 2 puffs into the lungs 2 (two) times daily.        . famotidine (PEPCID) 20 MG tablet Take 20 mg by mouth daily.      Marland Kitchen gabapentin (NEURONTIN) 300 MG capsule Take 2 capsules (600 mg total) by mouth 3 (three) times daily.  180 capsule  2  .  HYDROcodone-acetaminophen (NORCO) 7.5-325 MG per tablet Take 1 tablet by mouth every 8 (eight) hours as needed for pain.  90 tablet  3  . metoprolol succinate (TOPROL-XL) 25 MG 24 hr tablet TAKE 1 TABLET BY MOUTH EVERY DAY  30 tablet  6  . OLANZapine (ZYPREXA) 5 MG tablet Take 1 tablet (5 mg total) by mouth at bedtime.  30 tablet  2  . pantoprazole (PROTONIX) 40 MG tablet TAKE 1 TABLET EVERY DAY  30 tablet  3  . potassium chloride SA (K-DUR,KLOR-CON) 20 MEQ tablet 20 meq twice daily for 7 days then 1 daily until gone.  60 tablet  0  . pravastatin (PRAVACHOL) 40 MG tablet TAKE 1 TABLET BY MOUTH EVERY DAY  30 tablet  6  . promethazine (PHENERGAN) 25 MG suppository Place 25 mg rectally every 6 (six) hours as needed.      Marland Kitchen rOPINIRole (REQUIP) 0.5 MG tablet Take 0.5 mg by mouth 3 (three) times daily.      . Testosterone (ANDROGEL PUMP) 1.25 GM/ACT (1%) GEL Place onto the skin 4 (four) times daily.        . verapamil (CALAN-SR) 180 MG CR tablet TAKE 1 TABLET EVERY DAY  30 tablet  10  . vitamin B-12 (CYANOCOBALAMIN) 1000 MCG tablet Take 2,000 mcg by mouth daily.      . nitroGLYCERIN (NITROSTAT)  0.4 MG SL tablet Place 1 tablet (0.4 mg total) under the tongue every 5 (five) minutes as needed for chest pain.  25 tablet  3  . [DISCONTINUED] amLODipine (NORVASC) 5 MG tablet Take 5 mg by mouth daily.       . [DISCONTINUED] FLUoxetine (PROZAC) 20 MG capsule Take 20 mg by mouth 2 (two) times daily. Take 2 caps at one time       No current facility-administered medications for this visit.    Past Medical History  Diagnosis Date  . ASCVD (arteriosclerotic cardiovascular disease)     inferior MI with circumflex PTCA in 1993;cath in 2000-50% OM 1&2,30% LAD ;2008-25% LAD, 80% nondominant right ,60% small OM 2,EF of 55% with severe basilar inferior hypokinesis; stress nuclear study in 04/2008 moderate inferior scar with peri-infarction ischemic  . Syncope   . Hyperlipidemia   . Hypertension   . Tobacco abuse       50 pack years continuing at one halp pack daily  . COPD (chronic obstructive pulmonary disease)     mild ;excercise induced hypoxemia by cp stress test ;asthma ,bronchitis,  . Restless leg syndrome   . Anxiety and depression   . GERD (gastroesophageal reflux disease)   . Inflammatory polyps of colon with rectal bleeding   . Carcinoma in situ of colon 2004    rectal polyp  . H. pylori infection 2004    treated  . Sleep apnea     does not use CPAP  . Gastritis 12/30/10    EGD Dr Jena Gauss  . Colitis, ischemic 2011  . Tubular adenoma of colon 06/2009    Colonosocpy Dr Jena Gauss  . Leukocytosis     Dr Shon Baton  . Depression   . Anxiety   . Lung nodule 07/16/2011  . CAD (coronary artery disease)     Past Surgical History  Procedure Laterality Date  . Colonoscopy w/ polypectomy  2004    rectal polyp with carcinoma in situ removed via colonoscopy  . Hand surgery      surgical intervention for injury of the fingers of the left hand many years ago  . Colonoscopy  06/2009    normal terminal ileum, segmental mild inflammation of sigmoid colon (bx unremarkable), polyp, tubular adenoma  . Esophagogastroduodenoscopy  02/2009    query Barrett's but bx negative  . Esophagogastroduodenoscopy  12/30/2010    [name],gastritis, dilated 55F, sm HH, 1 small ulcer, Duodenal erosions, benign bx  . Flexible sigmoidoscopy  12/30/2010     [name],; internal hemorrhoids, anal papilla  . Heart stent    . Nasal septoplasty w/ turbinoplasty  10/06/2011    Procedure: NASAL SEPTOPLASTY WITH TURBINATE REDUCTION;  Surgeon: Serena Colonel, MD;  Location: [facility] OR;  Service: ENT;  Laterality: Bilateral;  . Cardiac catheterization    . Coronary angioplasty with stent placement  01/26/2012    "1; total is now 2"  . Esophagogastroduodenoscopy (egd) with esophageal dilation N/A 09/12/2012    [id number] Schatzki's ring s/p Maloney dilator. Small hiatal hernia  . Cholecystectomy  2004    ROS: Review of  systems complete and found to be negative unless listed above  PHYSICAL EXAM BP 141/86  Pulse 63  Ht 5\' 10"  (1.778 m)  Wt 165 lb 12 oz (75.184 kg)  BMI 23.78 kg/m2  General: Well developed, well nourished, in no acute distress Head: Eyes PERRLA, No xanthomas.   Normal cephalic and atramatic  Lungs: Clear bilaterally to auscultation. Heart: HRRR S1 S2,  without MRG.  Pulses are 2+ & equal.            No carotid bruit. No JVD.   Abdomen: Bowel sounds are positive, abdomen soft and non-tender without masses or                  Hernia's noted. Msk:  Back normal, normal gait. Normal strength and tone for age. Extremities: No clubbing, cyanosis or edema.  DP +1 Neuro: Alert and oriented X 3. Psych:  Good affect, responds appropriately  EKG:NSR rate of 63 bpm.  ASSESSMENT AND PLAN

## 2013-01-31 NOTE — Assessment & Plan Note (Signed)
He unfortunately continues to smoke. I have discussed need for him to stop with CVRF and CAD.

## 2013-01-31 NOTE — Progress Notes (Deleted)
Name: [name]    DOB: [birth year]  Age: 57 y.o.  MR#: [id number]       PCP:  [name], MD      Insurance: Payor: BLUE CROSS BLUE [employer]IELD / Plan: BCBS [employer] PPO / Product Type: *No Product type* /   CC:    Chief Complaint  Patient presents with  . Coronary Artery Disease  . Hyperlipidemia    VS Filed Vitals:   01/31/13 1258  BP: 141/86  Pulse: 63  Height: 5\' 10"  (1.778 m)  Weight: 165 lb 12 oz (75.184 kg)    Weights Current Weight  01/31/13 165 lb 12 oz (75.184 kg)  01/09/13 159 lb 6.4 oz (72.303 kg)  11/20/12 161 lb 12.8 oz (73.392 kg)    Blood Pressure  BP Readings from Last 3 Encounters:  01/31/13 141/86  01/09/13 116/82  11/20/12 136/87     Admit date:  (Not on file) Last encounter with RMR:  Visit date not found   Allergy Review of patient's allergies indicates no known allergies.  Current Outpatient Prescriptions  Medication Sig Dispense Refill  . albuterol (PROVENTIL) (2.5 MG/3ML) 0.083% nebulizer solution Take 2.5 mg by nebulization every 6 (six) hours as needed. For shortness of breath      . albuterol-ipratropium (COMBIVENT) 18-103 MCG/ACT inhaler Inhale 2 puffs into the lungs every 6 (six) hours as needed. For shortness of breath      . ALPRAZolam (XANAX) 1 MG tablet Take 1 tablet (1 mg total) by mouth 3 (three) times daily as needed for anxiety. For anxiety  90 tablet  2  . aspirin EC 81 MG tablet Take 81 mg by mouth daily.       . budesonide-formoterol (SYMBICORT) 80-4.5 MCG/ACT inhaler Inhale 2 puffs into the lungs 2 (two) times daily.        . famotidine (PEPCID) 20 MG tablet Take 20 mg by mouth daily.      Marland Kitchen gabapentin (NEURONTIN) 300 MG capsule Take 2 capsules (600 mg total) by mouth 3 (three) times daily.  180 capsule  2  . HYDROcodone-acetaminophen (NORCO) 7.5-325 MG per tablet Take 1 tablet by mouth every 8 (eight) hours as needed for pain.  90 tablet  3  . metoprolol succinate (TOPROL-XL) 25 MG 24 hr tablet TAKE 1 TABLET BY MOUTH EVERY  DAY  30 tablet  6  . OLANZapine (ZYPREXA) 5 MG tablet Take 1 tablet (5 mg total) by mouth at bedtime.  30 tablet  2  . pantoprazole (PROTONIX) 40 MG tablet TAKE 1 TABLET EVERY DAY  30 tablet  3  . potassium chloride SA (K-DUR,KLOR-CON) 20 MEQ tablet 20 meq twice daily for 7 days then 1 daily until gone.  60 tablet  0  . pravastatin (PRAVACHOL) 40 MG tablet TAKE 1 TABLET BY MOUTH EVERY DAY  30 tablet  6  . promethazine (PHENERGAN) 25 MG suppository Place 25 mg rectally every 6 (six) hours as needed.      Marland Kitchen rOPINIRole (REQUIP) 0.5 MG tablet Take 0.5 mg by mouth 3 (three) times daily.      . Testosterone (ANDROGEL PUMP) 1.25 GM/ACT (1%) GEL Place onto the skin 4 (four) times daily.        . verapamil (CALAN-SR) 180 MG CR tablet TAKE 1 TABLET EVERY DAY  30 tablet  10  . vitamin B-12 (CYANOCOBALAMIN) 1000 MCG tablet Take 2,000 mcg by mouth daily.      . nitroGLYCERIN (NITROSTAT) 0.4 MG SL tablet Place 1  tablet (0.4 mg total) under the tongue every 5 (five) minutes as needed for chest pain.  25 tablet  3  . [DISCONTINUED] amLODipine (NORVASC) 5 MG tablet Take 5 mg by mouth daily.       . [DISCONTINUED] FLUoxetine (PROZAC) 20 MG capsule Take 20 mg by mouth 2 (two) times daily. Take 2 caps at one time       No current facility-administered medications for this visit.    Discontinued Meds:   There are no discontinued medications.  Patient Active Problem List   Diagnosis Date Noted  . 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 anxiety 09/20/2012  . Pain in limb 09/20/2012  . Loose stools 09/07/2012  . Dyspepsia 09/05/2012  . Dysphagia, unspecified 09/05/2012  . Gastroesophageal reflux disease 05/09/2012  . Lung nodule 07/16/2011  . RLS (restless legs syndrome) 03/09/2011  . Leukocytosis 12/21/2010  . TOBACCO ABUSE 09/23/2009  . ATHEROSCLEROTIC CARDIOVASCULAR DISEASE 09/23/2009  . Hyperlipidemia 10/01/2008  . HYPERTENSION 10/01/2008  . Depression with anxiety 03/31/2007  . OBSTRUCTIVE SLEEP APNEA  03/31/2007  . Chronic obstructive pulmonary disease 03/31/2007    LABS    Component Value Date/Time   NA 142 01/09/2013 1447   NA 140 12/27/2012 0944   NA 139 10/16/2012 1043   K 3.9 01/09/2013 1447   K 4.3 12/27/2012 0944   K 3.2* 10/16/2012 1043   CL 106 01/09/2013 1447   CL 105 12/27/2012 0944   CL 102 10/16/2012 1043   CO2 27 01/09/2013 1447   CO2 28 12/27/2012 0944   CO2 26 10/16/2012 1043   GLUCOSE 90 01/09/2013 1447   GLUCOSE 145* 12/27/2012 0944   GLUCOSE 127* 10/16/2012 1043   BUN 8 01/09/2013 1447   BUN 9 12/27/2012 0944   BUN 12 10/16/2012 1043   CREATININE 0.82 01/09/2013 1447   CREATININE 0.80 12/27/2012 0944   CREATININE 0.78 10/16/2012 1043   CREATININE 0.84 08/03/2012 1118   CREATININE 0.78 04/10/2012 0933   CREATININE 0.83 08/31/2011 1423   CALCIUM 9.2 01/09/2013 1447   CALCIUM 9.2 12/27/2012 0944   CALCIUM 9.0 10/16/2012 1043   GFRNONAA >90 12/27/2012 0944   GFRNONAA >90 10/16/2012 1043   GFRNONAA >90 04/10/2012 0933   GFRAA >90 12/27/2012 0944   GFRAA >90 10/16/2012 1043   GFRAA >90 04/10/2012 0933   CMP     Component Value Date/Time   NA 142 01/09/2013 1447   K 3.9 01/09/2013 1447   CL 106 01/09/2013 1447   CO2 27 01/09/2013 1447   GLUCOSE 90 01/09/2013 1447   BUN 8 01/09/2013 1447   CREATININE 0.82 01/09/2013 1447   CREATININE 0.80 12/27/2012 0944   CALCIUM 9.2 01/09/2013 1447   PROT 6.7 10/16/2012 1043   ALBUMIN 3.8 10/16/2012 1043   AST 16 10/16/2012 1043   ALT 20 10/16/2012 1043   ALKPHOS 86 10/16/2012 1043   BILITOT 0.3 10/16/2012 1043   GFRNONAA >90 12/27/2012 0944   GFRAA >90 12/27/2012 0944       Component Value Date/Time   WBC 7.6 10/16/2012 1043   WBC 8.1 04/10/2012 0933   WBC 6.5 01/27/2012 0329   HGB 16.7 10/16/2012 1043   HGB 16.6 04/10/2012 0933   HGB 14.8 01/27/2012 0329   HCT 45.1 10/16/2012 1043   HCT 45.6 04/10/2012 0933   HCT 41.2 01/27/2012 0329   MCV 87.2 10/16/2012 1043   MCV 87.5 04/10/2012 0933   MCV 87.5 01/27/2012 0329    Lipid Panel     Component Value  Date/Time    CHOL 148 01/09/2013 1447   TRIG 127 01/09/2013 1447   HDL 32* 01/09/2013 1447   CHOLHDL 4.6 01/09/2013 1447   VLDL 25 01/09/2013 1447   LDLCALC 91 01/09/2013 1447    ABG    Component Value Date/Time   PHART 7.385 05/17/2012 0930   PCO2ART 38.9 05/17/2012 0930   PO2ART 84.8 05/17/2012 0930   HCO3 22.8 05/17/2012 0930   TCO2 19.3 05/17/2012 0930   ACIDBASEDEF 1.5 05/17/2012 0930   O2SAT 97.4 05/17/2012 0930     Lab Results  Component Value Date   TSH 1.011 02/09/2010   BNP (last 3 results) No results found for this basename: PROBNP,  in the last 8760 hours Cardiac Panel (last 3 results) No results found for this basename: CKTOTAL, CKMB, TROPONINI, RELINDX,  in the last 72 hours  Iron/TIBC/Ferritin No results found for this basename: iron, tibc, ferritin     EKG Orders placed in visit on 01/31/13  . EKG 12-LEAD     Prior Assessment and Plan Problem List as of 01/31/2013     Cardiovascular and Mediastinum   HYPERTENSION   Last Assessment & Plan   08/03/2012 Office Visit Written 08/03/2012 11:27 AM by Jodelle Gross, NP     Excellent control of BP presently. Continue current medications. Labs are pending.    ATHEROSCLEROTIC CARDIOVASCULAR DISEASE   Last Assessment & Plan   08/03/2012 Office Visit Written 08/03/2012 11:24 AM by Jodelle Gross, NP     No cardiac complaints at this time. He is not very physically active. Having no unstable angina symptoms. Continue risk factor management. No cardiac testing will be completed unless he is symptomatic.      Respiratory   OBSTRUCTIVE SLEEP APNEA   Last Assessment & Plan   03/09/2011 Office Visit Written 03/09/2011  5:27 PM by Barbaraann Share, MD     The patient has a history of very mild sleep apnea, however I am not sure if this has anything to do with his daytime sleepiness.  He is on large numbers of sedating medications, and by history he may also have the restless leg syndrome.  He clearly is not going to tolerate CPAP, and I have  discussed with him the other treatment options for his mild sleep apnea.  These include upper airway surgery, and also dental appliance.  I do not think he will tolerate an appliance given his nasal obstruction, but would consider the surgical option.  I will refer him to otolaryngology in the [place] area for evaluation.  Again, it is unclear how much this is contributing to his overall sleepiness.     Chronic obstructive pulmonary disease   Last Assessment & Plan   05/09/2012 Office Visit Written 05/09/2012  9:29 PM by Kathlen Brunswick, MD     The patient has not received specialist care for some years nor has he undergone any specific testing.  Pulmonary function tests will be obtained.      Digestive   Gastroesophageal reflux disease   Last Assessment & Plan   08/03/2012 Office Visit Written 08/03/2012 11:27 AM by Jodelle Gross, NP     He has been having a lot of diarrhea on prilosec. I will change this to Pepcid 20 mg daily, check Mg+ level. He is to follow up with Dr.Rehman for further assessment, As he is also on two statins, stopping one may also help with symptoms,.    Dysphagia, unspecified   Last Assessment &  Plan   09/05/2012 Office Visit Written 09/07/2012  4:26 PM by Nira Retort, NP     EGD/ED as planned.       Other   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 anxiety   Hyperlipidemia   Last Assessment & Plan   08/03/2012 Office Visit Written 08/03/2012 11:25 AM by Jodelle Gross, NP     It is noted that he is on both pravachol and atorvastatin. He is having some mild myalgia. I will stop the lipitor and continue pravachol. Check lipids and LFT,s and BMET.     TOBACCO ABUSE   Last Assessment & Plan   05/09/2012 Office Visit Written 05/09/2012  9:34 PM by Kathlen Brunswick, MD     Complete discontinuation of tobacco use strongly encouraged.  We provided teaching and recommended use of nicotine gum.    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 GI workup as outlined above.    RLS (restless legs syndrome)   Last Assessment & Plan   03/09/2011 Office Visit Written 03/09/2011  5:28 PM by Barbaraann Share, MD     The pt has a history that is suggestive of RLS, and I think we should give him a trial of a dopamine agonist.  He will give me feedback in a few weeks with his response.     Lung nodule   Last Assessment & Plan   08/31/2011 Office Visit Written 08/31/2011  2:17 PM by Kathlen Brunswick, MD     03/2011-small nodule in the left lower lobe, 7.5 x 6.6 mm.  Previously 5.4 x 4.5 mm.   Pulmonary neoplasm cannot be excluded.  Bullous emphysema.; continued CT surveillance     Dyspepsia   Last Assessment & Plan   09/05/2012 Office Visit Edited 09/07/2012  4:28 PM by Nira Retort, NP     57 year old male with history of chronic GERD, prior fluctuations in weight but only down 3 lbs from last visit, now with lack of appetite, early satiety, eating only one meal a day. States "goes through me like water. History of chronic loose stools with flex sig in 2012 unimpressive. Due for total colonoscopy June 2016. Intermittent dysphagia noted, with last dilation in 2012 at time of EGD: noted gastritis, negative biopsy. No melena noted. Unclear etiology at this point; needs EGD with dilation in near future due to dyspepsia and dysphagia. Would consider CT scan to assess for occult malignancy if EGD negative.  Proceed with upper endoscopy/dilation in the near future with Dr. Jena Gauss. The risks, benefits, and alternatives have been discussed in detail with patient. They have stated understanding and desire to proceed.  Phenergan 25 mg IV on call due to polypharmacy Avoid Prilosec, which patient had previously been on. Trial Protonix.     Loose stools   Last Assessment & Plan   09/05/2012 Office Visit Edited 09/07/2012  4:27 PM by Nira Retort, NP      Chronic, negative flex sig on file from 2012. Update with GI pathogen stool studies. May need adjunct therapy to include Imodium as previously recommended, possible Bentyl. Next TCS in June 2016.     Pain in limb       Imaging: No results found.

## 2013-01-31 NOTE — Assessment & Plan Note (Signed)
Does not use CPAP. This may be contributing to his fatigue.

## 2013-01-31 NOTE — Assessment & Plan Note (Signed)
He is without cardiac complaint. No chest pain, or DOE. He has ongoing fatigue. Risk modification and ongoing management will continue.

## 2013-02-07 ENCOUNTER — Other Ambulatory Visit (HOSPITAL_COMMUNITY): Payer: Self-pay | Admitting: Psychiatry

## 2013-02-08 NOTE — Telephone Encounter (Signed)
Xanax called in for 30 days, no refills

## 2013-02-20 ENCOUNTER — Ambulatory Visit (HOSPITAL_COMMUNITY): Payer: Self-pay | Admitting: Psychiatry

## 2013-02-20 ENCOUNTER — Encounter (HOSPITAL_COMMUNITY): Payer: Self-pay | Admitting: Psychiatry

## 2013-02-20 ENCOUNTER — Ambulatory Visit (INDEPENDENT_AMBULATORY_CARE_PROVIDER_SITE_OTHER): Payer: BC Managed Care – PPO | Admitting: Psychiatry

## 2013-02-20 VITALS — BP 130/88 | Ht 70.0 in | Wt 162.0 lb

## 2013-02-20 DIAGNOSIS — F418 Other specified anxiety disorders: Secondary | ICD-10-CM

## 2013-02-20 DIAGNOSIS — F411 Generalized anxiety disorder: Secondary | ICD-10-CM

## 2013-02-20 DIAGNOSIS — M79609 Pain in unspecified limb: Secondary | ICD-10-CM

## 2013-02-20 DIAGNOSIS — F17200 Nicotine dependence, unspecified, uncomplicated: Secondary | ICD-10-CM

## 2013-02-20 DIAGNOSIS — F332 Major depressive disorder, recurrent severe without psychotic features: Secondary | ICD-10-CM

## 2013-02-20 MED ORDER — GABAPENTIN 300 MG PO CAPS: 600.0000 mg | ORAL_CAPSULE | Freq: Three times a day (TID) | ORAL | Status: DC

## 2013-02-20 MED ORDER — OLANZAPINE 5 MG PO TABS: 5.0000 mg | ORAL_TABLET | Freq: Every day | ORAL | Status: DC

## 2013-02-20 MED ORDER — ALPRAZOLAM 1 MG PO TABS: 1.0000 mg | ORAL_TABLET | Freq: Three times a day (TID) | ORAL | Status: DC

## 2013-02-20 MED ORDER — FLUOXETINE HCL 20 MG PO CAPS: 20.0000 mg | ORAL_CAPSULE | Freq: Every day | ORAL | Status: DC

## 2013-02-20 NOTE — Progress Notes (Signed)
Patient ID: [name], male   DOB: [birth year], 57 y.o.   MRN: [id number] [facility] [id number] Progress Note [name] MRN: [id number] DOB: 1956-03-10 Age: 57 y.o.  Date: 02/20/2013 Start Time: 9:50 AM End Time: 10:05 AM  Chief Complaint: Chief Complaint  Patient presents with  . Depression  . Anxiety  . Medication Refill  . Follow-up    Subjective:  "I've been really sad . Her mother in law died last month."  This patient is a 57 year old married white male lives with his wife in [place]. He is on disability for coronary artery disease and COPD. He used to work for the [employer] but has not worked for the last 6 years. He has 3 step children 1 adopted child and several grandchildren.  The patient states he's been depressed and anxious primarily since he's had is working 6 years ago. He's also been through several deaths in his family his brother died suddenly 4 years ago. Following that his sister-in-law died in his home 8 months after his brother died. The patient is wife are caring for his wife's mother and she died last month at the age of 43. All of these things are disturbing to him. He's had trouble sleeping because he is thinking about the deaths. He's more depressed but not suicidal. He's not eating well and has lost about 20 pounds. He also has restless legs and chronic pain in his legs which keep him awake at night. He enjoys hunting and fishing but is less these interests recently. The Xanax does help with his anxiety but is not sure of the other psychiatric medications have helped. He never has been on any antidepressant .  Diagnosis:   Axis I: Anxiety Disorder NOS and Major Depression, Recurrent severe Axis II: Deferred Axis III:  Past Medical History  Diagnosis Date  . ASCVD (arteriosclerotic cardiovascular disease)     inferior MI with circumflex PTCA in 1993;cath in 2000-50% OM 1&2,30% LAD ;2008-25% LAD, 80% nondominant right ,60% small OM 2,EF of 55% with  severe basilar inferior hypokinesis; stress nuclear study in 04/2008 moderate inferior scar with peri-infarction ischemic  . Syncope   . Hyperlipidemia   . Hypertension   . Tobacco abuse     50 pack years continuing at one halp pack daily  . COPD (chronic obstructive pulmonary disease)     mild ;excercise induced hypoxemia by cp stress test ;asthma ,bronchitis,  . Restless leg syndrome   . Anxiety and depression   . GERD (gastroesophageal reflux disease)   . Inflammatory polyps of colon with rectal bleeding   . Carcinoma in situ of colon 2004    rectal polyp  . H. pylori infection 2004    treated  . Sleep apnea     does not use CPAP  . Gastritis 12/30/10    EGD Dr Jena Gauss  . Colitis, ischemic 2011  . Tubular adenoma of colon 06/2009    Colonosocpy Dr Jena Gauss  . Leukocytosis     Dr Shon Baton  . Depression   . Anxiety   . Lung nodule 07/16/2011  . CAD (coronary artery disease)    Axis IV: other psychosocial or environmental problems Axis V: 41-50 serious symptoms  ADL's:  Intact  Sleep: Fair  Appetite:  Poor  Allergies: No Known Allergies Medical History: Past Medical History  Diagnosis Date  . ASCVD (arteriosclerotic cardiovascular disease)     inferior MI with circumflex PTCA in 1993;cath in 2000-50% OM 1&2,30% LAD ;2008-25% LAD,  80% nondominant right ,60% small OM 2,EF of 55% with severe basilar inferior hypokinesis; stress nuclear study in 04/2008 moderate inferior scar with peri-infarction ischemic  . Syncope   . Hyperlipidemia   . Hypertension   . Tobacco abuse     50 pack years continuing at one halp pack daily  . COPD (chronic obstructive pulmonary disease)     mild ;excercise induced hypoxemia by cp stress test ;asthma ,bronchitis,  . Restless leg syndrome   . Anxiety and depression   . GERD (gastroesophageal reflux disease)   . Inflammatory polyps of colon with rectal bleeding   . Carcinoma in situ of colon 2004    rectal polyp  . H. pylori infection  2004    treated  . Sleep apnea     does not use CPAP  . Gastritis 12/30/10    EGD Dr Jena Gauss  . Colitis, ischemic 2011  . Tubular adenoma of colon 06/2009    Colonosocpy Dr Jena Gauss  . Leukocytosis     Dr Shon Baton  . Depression   . Anxiety   . Lung nodule 07/16/2011  . CAD (coronary artery disease)    Surgical History: Past Surgical History  Procedure Laterality Date  . Colonoscopy w/ polypectomy  2004    rectal polyp with carcinoma in situ removed via colonoscopy  . Hand surgery      surgical intervention for injury of the fingers of the left hand many years ago  . Colonoscopy  06/2009    normal terminal ileum, segmental mild inflammation of sigmoid colon (bx unremarkable), polyp, tubular adenoma  . Esophagogastroduodenoscopy  02/2009    query Barrett's but bx negative  . Esophagogastroduodenoscopy  12/30/2010    [name],gastritis, dilated 74F, sm HH, 1 small ulcer, Duodenal erosions, benign bx  . Flexible sigmoidoscopy  12/30/2010     [name],; internal hemorrhoids, anal papilla  . Heart stent    . Nasal septoplasty w/ turbinoplasty  10/06/2011    Procedure: NASAL SEPTOPLASTY WITH TURBINATE REDUCTION;  Surgeon: Serena Colonel, MD;  Location: [facility] OR;  Service: ENT;  Laterality: Bilateral;  . Cardiac catheterization    . Coronary angioplasty with stent placement  01/26/2012    "1; total is now 2"  . Esophagogastroduodenoscopy (egd) with esophageal dilation N/A 09/12/2012    [id number] Schatzki's ring s/p Maloney dilator. Small hiatal hernia  . Cholecystectomy  2004   Family History: family history includes Cancer in his maternal aunt and paternal uncle; Depression in his mother; Heart disease in his mother; Hypertension in his brother; Kidney failure in his maternal uncle; Lung disease in his father. There is no history of Colon cancer, ADD / ADHD, Alcohol abuse, Drug abuse, Anxiety disorder, Bipolar disorder, Dementia, OCD, Paranoid behavior, Schizophrenia, Physical  abuse, Sexual abuse, or Seizures. Reviewed and nothing new today.  Suicidal Ideation:  Pt has thoughts that are off and on, but not too bad lately Homicidal Ideation:  Pt has some struggles with his daughter's boy friend.  He stays away from him. AEB (as evidenced by):per pt report  Psychiatric Specialty Exam: ROS Neuro: no headaches, ataxia, weakness, gives out easily GI: no N/V/cramps/constipation, he keeps diarrhea for the past 6 or 7 years MS: no weakness or aches. He catches cramps really bad.   Blood pressure 130/88, height 5\' 10"  (1.778 m), weight 162 lb (73.483 kg).Body mass index is 23.24 kg/(m^2).  General Appearance: Casual  Eye Contact::  Fair  Speech:  Clear and Coherent  Volume:  Normal  Mood:  Anxious and depressed   Affect:  Congruent  Thought Process:  Coherent, Linear and Logical  Orientation:  Full (Time, Place, and Person)  Thought Content:  Hallucinations: grief  Suicidal Thoughts:  Yes.  without intent/plan  Homicidal Thoughts:  No  Memory:  Immediate;   Fair Recent;   Fair Remote;   Fair  Judgement:  Fair  Insight:  Fair  Psychomotor Activity:  Normal  Concentration:  Fair  Recall:  Fair  Akathisia:  No  Handed:  Right  AIMS (if indicated):     Assets:  Communication Skills Desire for Improvement  Sleep:      Current Medications: Zyprexa 5 mg at bed time Xanax 1 mg TID Neurontin 600 mg 3 times a day  Lab Results: No results found for this or any previous visit (from the past 48 hour(s)). Cardiologist draws periodic labs.  He has not heard back on the last set.  They usually tell him if there are concerns.  Physical Findings: AIMS:  , ,  ,  ,    CIWA:    COWS:     Treatment Plan Summary: Medication management  Plan: I took his vitals.  I reviewed CC, tobacco/med/surg Hx, meds effects/ side effects, problem list, therapies and responses as well as current situation/symptoms discussed options. Continue current medications but add Prozac  20 mg every morning. He'll return to see me in 6 weeks the See orders and pt instructions for more details.  MEDICATIONS this encounter: Meds ordered this encounter  Medications  . FLUoxetine (PROZAC) 20 MG capsule    Sig: Take 1 capsule (20 mg total) by mouth daily.    Dispense:  30 capsule    Refill:  2  . gabapentin (NEURONTIN) 300 MG capsule    Sig: Take 2 capsules (600 mg total) by mouth 3 (three) times daily.    Dispense:  180 capsule    Refill:  2  . OLANZapine (ZYPREXA) 5 MG tablet    Sig: Take 1 tablet (5 mg total) by mouth at bedtime.    Dispense:  30 tablet    Refill:  2  . ALPRAZolam (XANAX) 1 MG tablet    Sig: Take 1 tablet (1 mg total) by mouth 3 (three) times daily.    Dispense:  30 tablet    Refill:  0    Medical Decision Making Problem Points:  Established problem, stable/improving (1), Review of last therapy session (1) and Review of psycho-social stressors (1) Data Points:  Review or order clinical lab tests (1) Review of medication regiment & side effects (2)  I certify that outpatient services furnished can reasonably be expected to improve the patient's condition.   ROSS, [facility] 02/20/2013, 9:02 AM

## 2013-04-03 ENCOUNTER — Encounter (HOSPITAL_COMMUNITY): Payer: Self-pay | Admitting: Psychiatry

## 2013-04-03 ENCOUNTER — Ambulatory Visit (INDEPENDENT_AMBULATORY_CARE_PROVIDER_SITE_OTHER): Payer: BC Managed Care – PPO | Admitting: Psychiatry

## 2013-04-03 VITALS — BP 120/80 | Ht 70.0 in | Wt 171.0 lb

## 2013-04-03 DIAGNOSIS — M79609 Pain in unspecified limb: Secondary | ICD-10-CM

## 2013-04-03 DIAGNOSIS — F332 Major depressive disorder, recurrent severe without psychotic features: Secondary | ICD-10-CM

## 2013-04-03 DIAGNOSIS — F17200 Nicotine dependence, unspecified, uncomplicated: Secondary | ICD-10-CM

## 2013-04-03 DIAGNOSIS — F418 Other specified anxiety disorders: Secondary | ICD-10-CM

## 2013-04-03 DIAGNOSIS — F411 Generalized anxiety disorder: Secondary | ICD-10-CM

## 2013-04-03 MED ORDER — GABAPENTIN 300 MG PO CAPS: 600.0000 mg | ORAL_CAPSULE | Freq: Three times a day (TID) | ORAL | Status: DC

## 2013-04-03 MED ORDER — OLANZAPINE 5 MG PO TABS: 5.0000 mg | ORAL_TABLET | Freq: Every day | ORAL | Status: DC

## 2013-04-03 MED ORDER — FLUOXETINE HCL 40 MG PO CAPS: 40.0000 mg | ORAL_CAPSULE | Freq: Every day | ORAL | Status: DC

## 2013-04-03 MED ORDER — ALPRAZOLAM 1 MG PO TABS: 1.0000 mg | ORAL_TABLET | Freq: Three times a day (TID) | ORAL | Status: DC

## 2013-04-03 NOTE — Progress Notes (Signed)
Patient ID: [name], male   DOB: [birth year], 57 y.o.   MRN: [id number] Patient ID: [name], male   DOB: March 03, 1956, 57 y.o.   MRN: [id number] [facility] [address] Progress Note [name] MRN: [id number] DOB: [birth year] Age: 57 y.o.  Date: 04/03/2013 Start Time: 9:50 AM End Time: 10:05 AM  Chief Complaint: Chief Complaint  Patient presents with  . Anxiety  . Depression  . Follow-up    Subjective:  "I've been really sad . Her mother in law died last month."  This patient is a 57 year old married white male lives with his wife in [place]. He is on disability for coronary artery disease and COPD. He used to work for the [employer] but has not worked for the last 6 years. He has 3 step children 1 adopted child and several grandchildren.  The patient states he's been depressed and anxious primarily since he stopped working 6 years ago. He's also been through several deaths in his family his brother died suddenly 4 years ago. Following that his sister-in-law died in his home 8 months after his brother died. The patient's wife was caring for his wife's mother and she died last month at the age of 17. All of these things are disturbing to him. He's had trouble sleeping because he is thinking about the deaths. He's more depressed but not suicidal. He's not eating well and has lost about 20 pounds. He also has restless legs and chronic pain in his legs which keep him awake at night. He enjoys hunting and fishing but is less these interests recently.  The patient returns after 6 weeks. Last time he was started on Prozac 20 mg every morning. It is starting to help. His energy and interests are improving. He thinks the dose might need to go up a bit. His cousin recently died from complications of an infection and this is gotten him down. He is not suicidal and is starting to do more work around his house. He sleeping well the combination of Zyprexa and Xanax .  Diagnosis:   Axis I:  Anxiety Disorder NOS and Major Depression, Recurrent severe Axis II: Deferred Axis III:  Past Medical History  Diagnosis Date  . ASCVD (arteriosclerotic cardiovascular disease)     inferior MI with circumflex PTCA in 1993;cath in 2000-50% OM 1&2,30% LAD ;2008-25% LAD, 80% nondominant right ,60% small OM 2,EF of 55% with severe basilar inferior hypokinesis; stress nuclear study in 04/2008 moderate inferior scar with peri-infarction ischemic  . Syncope   . Hyperlipidemia   . Hypertension   . Tobacco abuse     50 pack years continuing at one halp pack daily  . COPD (chronic obstructive pulmonary disease)     mild ;excercise induced hypoxemia by cp stress test ;asthma ,bronchitis,  . Restless leg syndrome   . Anxiety and depression   . GERD (gastroesophageal reflux disease)   . Inflammatory polyps of colon with rectal bleeding   . Carcinoma in situ of colon 2004    rectal polyp  . H. pylori infection 2004    treated  . Sleep apnea     does not use CPAP  . Gastritis 12/30/10    EGD Dr Jena Gauss  . Colitis, ischemic 2011  . Tubular adenoma of colon 06/2009    Colonosocpy Dr Jena Gauss  . Leukocytosis     Dr Shon Baton  . Depression   . Anxiety   . Lung nodule 07/16/2011  . CAD (coronary artery disease)  Axis IV: other psychosocial or environmental problems Axis V: 41-50 serious symptoms  ADL's:  Intact  Sleep: Fair  Appetite:  Poor  Allergies: No Known Allergies Medical History: Past Medical History  Diagnosis Date  . ASCVD (arteriosclerotic cardiovascular disease)     inferior MI with circumflex PTCA in 1993;cath in 2000-50% OM 1&2,30% LAD ;2008-25% LAD, 80% nondominant right ,60% small OM 2,EF of 55% with severe basilar inferior hypokinesis; stress nuclear study in 04/2008 moderate inferior scar with peri-infarction ischemic  . Syncope   . Hyperlipidemia   . Hypertension   . Tobacco abuse     50 pack years continuing at one halp pack daily  . COPD (chronic obstructive  pulmonary disease)     mild ;excercise induced hypoxemia by cp stress test ;asthma ,bronchitis,  . Restless leg syndrome   . Anxiety and depression   . GERD (gastroesophageal reflux disease)   . Inflammatory polyps of colon with rectal bleeding   . Carcinoma in situ of colon 2004    rectal polyp  . H. pylori infection 2004    treated  . Sleep apnea     does not use CPAP  . Gastritis 12/30/10    EGD Dr Jena Gauss  . Colitis, ischemic 2011  . Tubular adenoma of colon 06/2009    Colonosocpy Dr Jena Gauss  . Leukocytosis     Dr Shon Baton  . Depression   . Anxiety   . Lung nodule 07/16/2011  . CAD (coronary artery disease)    Surgical History: Past Surgical History  Procedure Laterality Date  . Colonoscopy w/ polypectomy  2004    rectal polyp with carcinoma in situ removed via colonoscopy  . Hand surgery      surgical intervention for injury of the fingers of the left hand many years ago  . Colonoscopy  06/2009    normal terminal ileum, segmental mild inflammation of sigmoid colon (bx unremarkable), polyp, tubular adenoma  . Esophagogastroduodenoscopy  02/2009    query Barrett's but bx negative  . Esophagogastroduodenoscopy  12/30/2010    [name],gastritis, dilated 26F, sm HH, 1 small ulcer, Duodenal erosions, benign bx  . Flexible sigmoidoscopy  12/30/2010     [name],; internal hemorrhoids, anal papilla  . Heart stent    . Nasal septoplasty w/ turbinoplasty  10/06/2011    Procedure: NASAL SEPTOPLASTY WITH TURBINATE REDUCTION;  Surgeon: Serena Colonel, MD;  Location: [facility] OR;  Service: ENT;  Laterality: Bilateral;  . Cardiac catheterization    . Coronary angioplasty with stent placement  01/26/2012    "1; total is now 2"  . Esophagogastroduodenoscopy (egd) with esophageal dilation N/A 09/12/2012    [id number] Schatzki's ring s/p Maloney dilator. Small hiatal hernia  . Cholecystectomy  2004   Family History: family history includes Cancer in his maternal aunt and paternal  uncle; Depression in his mother; Heart disease in his mother; Hypertension in his brother; Kidney failure in his maternal uncle; Lung disease in his father. There is no history of Colon cancer, ADD / ADHD, Alcohol abuse, Drug abuse, Anxiety disorder, Bipolar disorder, Dementia, OCD, Paranoid behavior, Schizophrenia, Physical abuse, Sexual abuse, or Seizures. Reviewed and nothing new today.  Suicidal Ideation:  Pt has thoughts that are off and on, but not too bad lately Homicidal Ideation:  Pt has some struggles with his daughter's boy friend.  He stays away from him. AEB (as evidenced by):per pt report  Psychiatric Specialty Exam: ROS Neuro: no headaches, ataxia, weakness, gives out easily GI:  no N/V/cramps/constipation, he keeps diarrhea for the past 6 or 7 years MS: no weakness or aches. He catches cramps really bad.   Blood pressure 120/80, height 5\' 10"  (1.778 m), weight 171 lb (77.565 kg).Body mass index is 24.54 kg/(m^2).  General Appearance: Casual  Eye Contact::  Fair  Speech:  Clear and Coherent  Volume:  Normal  Mood:  Anxious , less depressed   Affect:  Congruent  Thought Process:  Coherent, Linear and Logical  Orientation:  Full (Time, Place, and Person)  Thought Content:  Within normal limits   Suicidal Thoughts:  Yes.  without intent/plan  Homicidal Thoughts:  No  Memory:  Immediate;   Fair Recent;   Fair Remote;   Fair  Judgement:  Fair  Insight:  Fair  Psychomotor Activity:  Normal  Concentration:  Fair  Recall:  Fair  Akathisia:  No  Handed:  Right  AIMS (if indicated):     Assets:  Communication Skills Desire for Improvement  Sleep:      Current Medications: Zyprexa 5 mg at bed time Xanax 1 mg TID Neurontin 600 mg 3 times a day  Lab Results: No results found for this or any previous visit (from the past 48 hour(s)). Cardiologist draws periodic labs.  He has not heard back on the last set.  They usually tell him if there are concerns.  Physical  Findings: AIMS:  , ,  ,  ,    CIWA:    COWS:     Treatment Plan Summary: Medication management  Plan: I took his vitals.  I reviewed CC, tobacco/med/surg Hx, meds effects/ side effects, problem list, therapies and responses as well as current situation/symptoms discussed options. Continue current medications but increase Prozac to 40 mg every morning. He'll return to see me in 3 months but call if symptoms worsen before that See orders and pt instructions for more details.  MEDICATIONS this encounter: Meds ordered this encounter  Medications  . ALPRAZolam (XANAX) 1 MG tablet    Sig: Take 1 tablet (1 mg total) by mouth 3 (three) times daily.    Dispense:  90 tablet    Refill:  2  . FLUoxetine (PROZAC) 40 MG capsule    Sig: Take 1 capsule (40 mg total) by mouth daily.    Dispense:  30 capsule    Refill:  2  . gabapentin (NEURONTIN) 300 MG capsule    Sig: Take 2 capsules (600 mg total) by mouth 3 (three) times daily.    Dispense:  180 capsule    Refill:  2  . OLANZapine (ZYPREXA) 5 MG tablet    Sig: Take 1 tablet (5 mg total) by mouth at bedtime.    Dispense:  30 tablet    Refill:  2    Medical Decision Making Problem Points:  Established problem, stable/improving (1), Review of last therapy session (1) and Review of psycho-social stressors (1) Data Points:  Review or order clinical lab tests (1) Review of medication regiment & side effects (2)  I certify that outpatient services furnished can reasonably be expected to improve the patient's condition.   ROSS, [facility] 04/03/2013, 8:51 AM

## 2013-04-06 ENCOUNTER — Encounter (HOSPITAL_COMMUNITY): Payer: BC Managed Care – PPO | Attending: Hematology and Oncology

## 2013-04-06 DIAGNOSIS — D72829 Elevated white blood cell count, unspecified: Secondary | ICD-10-CM

## 2013-04-06 LAB — DIFFERENTIAL
Basophils Absolute: 0 10*3/uL (ref 0.0–0.1)
Basophils Relative: 0 % (ref 0–1)
Eosinophils Relative: 1 % (ref 0–5)
Lymphocytes Relative: 29 % (ref 12–46)
Lymphs Abs: 2.3 10*3/uL (ref 0.7–4.0)
Neutrophils Relative %: 65 % (ref 43–77)

## 2013-04-06 LAB — CBC
Hemoglobin: 16.7 g/dL (ref 13.0–17.0)
MCH: 32.7 pg (ref 26.0–34.0)
MCHC: 36.7 g/dL — ABNORMAL HIGH (ref 30.0–36.0)
MCV: 89 fL (ref 78.0–100.0)
RBC: 5.11 MIL/uL (ref 4.22–5.81)
RDW: 12.7 % (ref 11.5–15.5)

## 2013-04-06 LAB — COMPREHENSIVE METABOLIC PANEL
ALT: 32 U/L (ref 0–53)
CO2: 29 mEq/L (ref 19–32)
Calcium: 9.2 mg/dL (ref 8.4–10.5)
Creatinine, Ser: 0.8 mg/dL (ref 0.50–1.35)
GFR calc Af Amer: >90 mL/min (ref >90)
GFR calc non Af Amer: >90 mL/min (ref >90)
Glucose, Bld: 142 mg/dL — ABNORMAL HIGH (ref 70–99)

## 2013-04-06 NOTE — Progress Notes (Signed)
Labs drawn today for cbc/diff,cmp 

## 2013-04-09 ENCOUNTER — Ambulatory Visit (HOSPITAL_COMMUNITY)
Admission: RE | Admit: 2013-04-09 | Discharge: 2013-04-09 | Disposition: A | Discharge status: Home / Self Care | Payer: BC Managed Care – PPO | Source: Ambulatory Visit | Attending: Oncology | Admitting: Oncology

## 2013-04-09 ENCOUNTER — Other Ambulatory Visit (HOSPITAL_COMMUNITY): Payer: Self-pay

## 2013-04-09 DIAGNOSIS — R911 Solitary pulmonary nodule: Secondary | ICD-10-CM

## 2013-04-09 DIAGNOSIS — R918 Other nonspecific abnormal finding of lung field: Secondary | ICD-10-CM | POA: Insufficient documentation

## 2013-04-09 DIAGNOSIS — I2584 Coronary atherosclerosis due to calcified coronary lesion: Secondary | ICD-10-CM | POA: Insufficient documentation

## 2013-04-09 DIAGNOSIS — R0602 Shortness of breath: Secondary | ICD-10-CM | POA: Insufficient documentation

## 2013-04-09 NOTE — Progress Notes (Signed)
[name] Punt, MD [address] [place] [address]  Leukocytosis  Lung nodule  CURRENT THERAPY: Observation  INTERVAL HISTORY: [name] 57 y.o. male returns for  regular  visit for followup of history of leukocytosis and lung nodules (stable since 2012).  I personally reviewed and went over laboratory results with the patient.  Labs reveal a WBC of 8.0, Hgb 16.7 g/dL, and platelet count of 160. Metabolic panel is unremarkable.   I personally reviewed and went over radiographic studies with the patient.  CT of chest demonstrate stability of lung nodules dating back to 2012.   He denies having an influenza vaccine yet this year and we will provide him one today.  Chart is reviewed:  A. Leukocytosis was last noted on 03/12/2011 and none noted since then.  Previous to that date WBC was intermittently elevated.   B. CT of chest shows stability of pulmonary nodules dating back to October 2012.  Recent CT of chest again demonstrates stability.  With this information, it is time to release the patient from the clinic.    He admits he quit cigarettes 6 weeks ago and is solely on the electronic cigarette with the goal to completely quit in the future.  He is using 6% nicotine cartridges now and he wants to transition to 0% nicotine cartridges in the future.  I congratulated him on this accomplishment and encouraged him to continue with this plan.  He admits that he become SOB on exertion which is to be expected with his degree of lung disease.   Hematologically, he denies any complaints and ROS questioning is negative.   Past Medical History  Diagnosis Date  . ASCVD (arteriosclerotic cardiovascular disease)     inferior MI with circumflex PTCA in 1993;cath in 2000-50% OM 1&2,30% LAD ;2008-25% LAD, 80% nondominant right ,60% small OM 2,EF of 55% with severe basilar inferior hypokinesis; stress nuclear study in 04/2008 moderate inferior scar with peri-infarction ischemic   . Syncope   . Hyperlipidemia   . Hypertension   . Tobacco abuse     50 pack years continuing at one halp pack daily  . COPD (chronic obstructive pulmonary disease)     mild ;excercise induced hypoxemia by cp stress test ;asthma ,bronchitis,  . Restless leg syndrome   . Anxiety and depression   . GERD (gastroesophageal reflux disease)   . Inflammatory polyps of colon with rectal bleeding   . Carcinoma in situ of colon 2004    rectal polyp  . H. pylori infection 2004    treated  . Sleep apnea     does not use CPAP  . Gastritis 12/30/10    EGD Dr Jena Gauss  . Colitis, ischemic 2011  . Tubular adenoma of colon 06/2009    Colonosocpy Dr Jena Gauss  . Leukocytosis     Dr Shon Baton  . Depression   . Anxiety   . Lung nodule 07/16/2011  . CAD (coronary artery disease)     has Hyperlipidemia; TOBACCO ABUSE; Depression with anxiety; OBSTRUCTIVE SLEEP APNEA; HYPERTENSION; ATHEROSCLEROTIC CARDIOVASCULAR DISEASE; Chronic obstructive pulmonary disease; Leukocytosis; RLS (restless legs syndrome); Lung nodule; Gastroesophageal reflux disease; Dyspepsia; Dysphagia, unspecified; Loose stools; 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 anxiety; and Pain in limb on his problem list.     has No Known Allergies.  [name] does not currently have medications on file.  Past Surgical History  Procedure Laterality Date  . Colonoscopy w/ polypectomy  2004    rectal polyp with  carcinoma in situ removed via colonoscopy  . Hand surgery      surgical intervention for injury of the fingers of the left hand many years ago  . Colonoscopy  06/2009    normal terminal ileum, segmental mild inflammation of sigmoid colon (bx unremarkable), polyp, tubular adenoma  . Esophagogastroduodenoscopy  02/2009    query Barrett's but bx negative  . Esophagogastroduodenoscopy  12/30/2010    [name],gastritis, dilated 46F, sm HH, 1 small ulcer, Duodenal erosions, benign bx  . Flexible sigmoidoscopy  12/30/2010     Gerrit Friends  Rourk,; internal hemorrhoids, anal papilla  . Heart stent    . Nasal septoplasty w/ turbinoplasty  10/06/2011    Procedure: NASAL SEPTOPLASTY WITH TURBINATE REDUCTION;  Surgeon: Serena Colonel, MD;  Location: [facility] OR;  Service: ENT;  Laterality: Bilateral;  . Cardiac catheterization    . Coronary angioplasty with stent placement  01/26/2012    "1; total is now 2"  . Esophagogastroduodenoscopy (egd) with esophageal dilation N/A 09/12/2012    WUJ:[id number] Schatzki's ring s/p Maloney dilator. Small hiatal hernia  . Cholecystectomy  2004    Denies any headaches, dizziness, double vision, fevers, chills, night sweats, nausea, vomiting, diarrhea, constipation, chest pain, heart palpitations, shortness of breath, blood in stool, black tarry stool, urinary pain, urinary burning, urinary frequency, hematuria.   PHYSICAL EXAMINATION  ECOG PERFORMANCE STATUS: 1 - Symptomatic but completely ambulatory  There were no vitals filed for this visit.  GENERAL:alert, no distress, well nourished, well developed, comfortable, cooperative, smiling and chronically-ill appearing SKIN: skin color, texture, turgor are normal, no rashes or significant lesions HEAD: Normocephalic, No masses, lesions, tenderness or abnormalities EYES: normal, PERRLA, EOMI, Conjunctiva are pink and non-injected EARS: External ears normal OROPHARYNX:mucous membranes are moist  NECK: supple, no adenopathy, thyroid normal size, non-tender, without nodularity, no stridor, non-tender, trachea midline LYMPH:  no palpable lymphadenopathy, no hepatosplenomegaly BREAST:not examined LUNGS: clear to auscultation and percussion, decreased breath sounds HEART: regular rate & rhythm, no murmurs, no gallops, S1 normal and S2 normal ABDOMEN:abdomen soft, non-tender, normal bowel sounds, no masses or organomegaly and no hepatosplenomegaly BACK: Back symmetric, no curvature., No CVA tenderness EXTREMITIES:less then 2 second capillary refill, no joint  deformities, effusion, or inflammation, no edema, no skin discoloration NEURO: alert & oriented x 3 with fluent speech, no focal motor/sensory deficits, gait normal    LABORATORY DATA: CBC    Component Value Date/Time   WBC 8.0 04/06/2013 1048   RBC 5.11 04/06/2013 1048   HGB 16.7 04/06/2013 1048   HCT 45.5 04/06/2013 1048   PLT 160 04/06/2013 1048   MCV 89.0 04/06/2013 1048   MCH 32.7 04/06/2013 1048   MCHC 36.7* 04/06/2013 1048   RDW 12.7 04/06/2013 1048   LYMPHSABS 2.3 04/06/2013 1048   MONOABS 0.4 04/06/2013 1048   EOSABS 0.1 04/06/2013 1048   BASOSABS 0.0 04/06/2013 1048      Chemistry      Component Value Date/Time   NA 139 04/06/2013 1048   K 4.0 04/06/2013 1048   CL 102 04/06/2013 1048   CO2 29 04/06/2013 1048   BUN 8 04/06/2013 1048   CREATININE 0.80 04/06/2013 1048   CREATININE 0.82 01/09/2013 1447      Component Value Date/Time   CALCIUM 9.2 04/06/2013 1048   ALKPHOS 81 04/06/2013 1048   AST 21 04/06/2013 1048   ALT 32 04/06/2013 1048   BILITOT 0.5 04/06/2013 1048        RADIOGRAPHIC STUDIES:  Ct Chest  Wo Contrast  04/09/2013   CLINICAL DATA:  Followup pulmonary nodules, shortness of breath.  EXAM: CT CHEST WITHOUT CONTRAST  TECHNIQUE: Multidetector CT imaging of the chest was performed following the standard protocol without IV contrast.  COMPARISON:  09/14/2012, 04/14/2012 and 03/17/2011.  FINDINGS: No pathologically enlarged mediastinal, hilar or axillary lymph nodes. Atherosclerotic calcification of the arterial vasculature, including coronary arteries. Heart size within normal limits. No pericardial effusion.  Centrilobular and paraseptal emphysema with bullous changes at the apex of the right hemi thorax. Linear subpleural scarring in the posterior right upper lobe. Scattered pulmonary parenchymal volume loss. Scattered pulmonary nodules measure up to 7 x 8 mm in the left lower lobe, unchanged from 03/17/2011. No pleural fluid. Airway is unremarkable.   Incidental imaging of the upper abdomen shows no acute findings. No worrisome lytic or sclerotic lesions.  IMPRESSION: 1. Scattered pulmonary nodules are unchanged from 03/17/2011 and are therefore considered benign. 2. Coronary artery calcification.   Electronically Signed   By: Leanna Battles M.D.   On: 04/09/2013 10:22      ASSESSMENT:  1. History of leukocytosis, resolved.  2. Lung nodules, stable dating back to 2012 3. COPD, still smoking.  Patient Active Problem List   Diagnosis Date Noted  . 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 anxiety 09/20/2012  . Pain in limb 09/20/2012  . Loose stools 09/07/2012  . Dyspepsia 09/05/2012  . Dysphagia, unspecified 09/05/2012  . Gastroesophageal reflux disease 05/09/2012  . Lung nodule 07/16/2011  . RLS (restless legs syndrome) 03/09/2011  . Leukocytosis 12/21/2010  . TOBACCO ABUSE 09/23/2009  . ATHEROSCLEROTIC CARDIOVASCULAR DISEASE 09/23/2009  . Hyperlipidemia 10/01/2008  . HYPERTENSION 10/01/2008  . Depression with anxiety 03/31/2007  . OBSTRUCTIVE SLEEP APNEA 03/31/2007  . Chronic obstructive pulmonary disease 03/31/2007     PLAN:  1. I personally reviewed and went over radiographic studies with the patient. 2. I personally reviewed and went over laboratory results with the patient. 3. Influenza vaccine today. 4. Release from clinic and follow-up with PCP as directed.     THERAPY PLAN:  Hematologically, his CBC is WNL.  His CT scan demonstrates stability of lung nodules.   He has demonstrated stability of both Ct of chest and CBC since 2012 and therefore, it is time to release the patient from the clinic.  I recommend follow-up with PCP as directed.   All questions were answered. The patient knows to call the clinic with any problems, questions or concerns. We can certainly see the patient much sooner if necessary.  Patient and plan discussed with Dr. Alla German and he is in agreement with the aforementioned.    Ramelo Oetken

## 2013-04-10 ENCOUNTER — Encounter (HOSPITAL_BASED_OUTPATIENT_CLINIC_OR_DEPARTMENT_OTHER): Payer: BC Managed Care – PPO | Admitting: Oncology

## 2013-04-10 ENCOUNTER — Other Ambulatory Visit: Payer: Self-pay | Admitting: Cardiology

## 2013-04-10 ENCOUNTER — Encounter (HOSPITAL_COMMUNITY): Payer: Self-pay | Admitting: Oncology

## 2013-04-10 VITALS — BP 138/73 | HR 71 | Temp 97.3°F | Resp 18 | Wt 171.0 lb

## 2013-04-10 DIAGNOSIS — R911 Solitary pulmonary nodule: Secondary | ICD-10-CM

## 2013-04-10 DIAGNOSIS — Z0000 Encounter for general adult medical examination without abnormal findings: Secondary | ICD-10-CM

## 2013-04-10 DIAGNOSIS — D72829 Elevated white blood cell count, unspecified: Secondary | ICD-10-CM

## 2013-04-10 DIAGNOSIS — Z23 Encounter for immunization: Secondary | ICD-10-CM

## 2013-04-10 MED ORDER — INFLUENZA VAC SPLIT QUAD 0.5 ML IM SUSP
INTRAMUSCULAR | Status: AC
  Filled 2013-04-10: qty 0.5

## 2013-04-10 MED ORDER — INFLUENZA VAC SPLIT QUAD 0.5 ML IM SUSP
0.5000 mL | Freq: Once | INTRAMUSCULAR | Status: AC
  Administered 2013-04-10: 0.5 mL via INTRAMUSCULAR

## 2013-04-10 NOTE — Patient Instructions (Signed)
.  [facility] Discharge Instructions  RECOMMENDATIONS MADE BY THE CONSULTANT AND ANY TEST RESULTS WILL BE SENT TO YOUR REFERRING PHYSICIAN.  EXAM FINDINGS BY THE PHYSICIAN TODAY AND SIGNS OR SYMPTOMS TO REPORT TO CLINIC OR PRIMARY PHYSICIAN: Exam and findings as discussed by T. Jacalyn Lefevre PA.  MEDICATIONS PRESCRIBED:  Flu vaccine today  INSTRUCTIONS/FOLLOW-UP: Discharged from clinic  Thank you for choosing [facility] to provide your oncology and hematology care.  To afford each patient quality time with our providers, please arrive at least 15 minutes before your scheduled appointment time.  With your help, our goal is to use those 15 minutes to complete the necessary work-up to ensure our physicians have the information they need to help with your evaluation and healthcare recommendations.    Effective January 1st, 2014, we ask that you re-schedule your appointment with our physicians should you arrive 10 or more minutes late for your appointment.  We strive to give you quality time with our providers, and arriving late affects you and other patients whose appointments are after yours.    Again, thank you for choosing [facility].  Our hope is that these requests will decrease the amount of time that you wait before being seen by our physicians.       _____________________________________________________________  Should you have questions after your visit to [facility], please contact our office at [phone] between the hours of 8:30 a.m. and 5:00 p.m.  Voicemails left after 4:30 p.m. will not be returned until the following business day.  For prescription refill requests, have your pharmacy contact our office with your prescription refill request.

## 2013-04-10 NOTE — Progress Notes (Signed)
[name] presents today for injection per MD orders. fluarix vaccine administered IM in rt Upper Arm. Administration without incident. Patient tolerated well.

## 2013-04-12 ENCOUNTER — Other Ambulatory Visit: Payer: Self-pay

## 2013-04-12 ENCOUNTER — Other Ambulatory Visit: Payer: Self-pay | Admitting: *Deleted

## 2013-04-12 MED ORDER — FAMOTIDINE 20 MG PO TABS: 20.0000 mg | ORAL_TABLET | Freq: Every day | ORAL | Status: DC

## 2013-04-16 ENCOUNTER — Other Ambulatory Visit: Payer: Self-pay | Admitting: *Deleted

## 2013-04-16 ENCOUNTER — Telehealth: Payer: Self-pay | Admitting: Family Medicine

## 2013-04-16 MED ORDER — HYDROCODONE-ACETAMINOPHEN 7.5-325 MG PO TABS: 1.0000 | ORAL_TABLET | Freq: Three times a day (TID) | ORAL | Status: DC | PRN

## 2013-04-16 NOTE — Telephone Encounter (Signed)
Discussed with patient's wife. Script ready for pickup.

## 2013-04-16 NOTE — Telephone Encounter (Signed)
Patient states he was unaware the law had changed and was unable to get his prescription filled.  Has an appointment for April 25, 2013 at 8am.  He is currently out of his HYDROcodone-acetaminophen (NORCO) 7.5-325 MG per tablet.    Please call patient to discuss.

## 2013-04-16 NOTE — Telephone Encounter (Signed)
May refill x1, keep followup appointment.

## 2013-04-25 ENCOUNTER — Ambulatory Visit (INDEPENDENT_AMBULATORY_CARE_PROVIDER_SITE_OTHER): Payer: BC Managed Care – PPO | Admitting: Family Medicine

## 2013-04-25 ENCOUNTER — Encounter: Payer: Self-pay | Admitting: Family Medicine

## 2013-04-25 VITALS — BP 118/82 | Ht 70.0 in | Wt 174.0 lb

## 2013-04-25 DIAGNOSIS — R7309 Other abnormal glucose: Secondary | ICD-10-CM

## 2013-04-25 DIAGNOSIS — F418 Other specified anxiety disorders: Secondary | ICD-10-CM

## 2013-04-25 DIAGNOSIS — M79609 Pain in unspecified limb: Secondary | ICD-10-CM

## 2013-04-25 DIAGNOSIS — E785 Hyperlipidemia, unspecified: Secondary | ICD-10-CM

## 2013-04-25 DIAGNOSIS — J449 Chronic obstructive pulmonary disease, unspecified: Secondary | ICD-10-CM

## 2013-04-25 DIAGNOSIS — I10 Essential (primary) hypertension: Secondary | ICD-10-CM

## 2013-04-25 DIAGNOSIS — R739 Hyperglycemia, unspecified: Secondary | ICD-10-CM | POA: Insufficient documentation

## 2013-04-25 DIAGNOSIS — F341 Dysthymic disorder: Secondary | ICD-10-CM

## 2013-04-25 MED ORDER — HYDROCODONE-ACETAMINOPHEN 7.5-325 MG PO TABS: 1.0000 | ORAL_TABLET | Freq: Three times a day (TID) | ORAL | Status: DC | PRN

## 2013-04-25 NOTE — Progress Notes (Signed)
  Subjective:    Patient ID: [name], male    DOB: 11-11-1955, 57 y.o.   MRN: [id number]  HPIHere for a med check. Needs refill on pain meds. Patient denies abusing the medicine states it helps keep the pain in a more moderate level to where he can tolerate it.  Flu vaccine already given at cancer specialist. His recent scans for pulmonary nodule showed everything is stable it has been for a couple years does not need any further following a car according to oncology  Patient does try to eat healthy he does try to stay physically active. Past medical history family history problem list all reviewed   Review of Systems See above    Objective:   Physical Exam  Lungs are clear hearts regular abdomen soft pulse normal extremities no edema skin warm dry patient is disabled      Assessment & Plan:  25 minutes spent with the patient. Greater than half was spent discussing the importance of taking medications the importance of eating healthy exercising and keeping his pain under control with the medicine but also realizing that pain medication does not completely removed pain. He was also told that pain medicine can cause drowsiness not to take if he is feeling sick or not to drive if he is feeling drowsy with it.  Patient is currently using vapor electronic cigarettes for smoking I believe that this is better than the real smoking but it would be best to be to quit everything altogether I doubt it he can

## 2013-04-30 LAB — LIPID PANEL
Cholesterol: 196 mg/dL (ref 0–200)
HDL: 31 mg/dL — ABNORMAL LOW (ref >39)
Total CHOL/HDL Ratio: 6.3 Ratio
Triglycerides: 333 mg/dL — ABNORMAL HIGH (ref <150)

## 2013-04-30 LAB — HEPATIC FUNCTION PANEL
ALT: 23 U/L (ref 0–53)
AST: 16 U/L (ref 0–37)
Albumin: 4.1 g/dL (ref 3.5–5.2)
Alkaline Phosphatase: 73 U/L (ref 39–117)

## 2013-04-30 LAB — HEMOGLOBIN A1C: Hgb A1c MFr Bld: 5 % (ref <5.7)

## 2013-05-01 ENCOUNTER — Encounter: Payer: Self-pay | Admitting: Family Medicine

## 2013-05-11 ENCOUNTER — Ambulatory Visit (HOSPITAL_COMMUNITY): Payer: Self-pay | Admitting: Oncology

## 2013-05-14 ENCOUNTER — Ambulatory Visit (HOSPITAL_COMMUNITY): Payer: Self-pay

## 2013-06-09 ENCOUNTER — Other Ambulatory Visit: Payer: Self-pay | Admitting: Gastroenterology

## 2013-07-04 ENCOUNTER — Ambulatory Visit (INDEPENDENT_AMBULATORY_CARE_PROVIDER_SITE_OTHER): Payer: BC Managed Care – PPO | Admitting: Psychiatry

## 2013-07-04 ENCOUNTER — Encounter (HOSPITAL_COMMUNITY): Payer: Self-pay | Admitting: Psychiatry

## 2013-07-04 VITALS — BP 120/80 | Ht 70.0 in | Wt 166.0 lb

## 2013-07-04 DIAGNOSIS — F5105 Insomnia due to other mental disorder: Secondary | ICD-10-CM

## 2013-07-04 DIAGNOSIS — M79609 Pain in unspecified limb: Secondary | ICD-10-CM

## 2013-07-04 DIAGNOSIS — F489 Nonpsychotic mental disorder, unspecified: Secondary | ICD-10-CM

## 2013-07-04 DIAGNOSIS — F341 Dysthymic disorder: Secondary | ICD-10-CM

## 2013-07-04 DIAGNOSIS — F418 Other specified anxiety disorders: Secondary | ICD-10-CM

## 2013-07-04 DIAGNOSIS — F17200 Nicotine dependence, unspecified, uncomplicated: Secondary | ICD-10-CM

## 2013-07-04 MED ORDER — FLUOXETINE HCL 40 MG PO CAPS: 40.0000 mg | ORAL_CAPSULE | Freq: Every day | ORAL | Status: DC

## 2013-07-04 MED ORDER — OLANZAPINE 10 MG PO TABS: 10.0000 mg | ORAL_TABLET | Freq: Every day | ORAL | Status: DC

## 2013-07-04 MED ORDER — GABAPENTIN 300 MG PO CAPS: 600.0000 mg | ORAL_CAPSULE | Freq: Three times a day (TID) | ORAL | Status: DC

## 2013-07-04 MED ORDER — ALPRAZOLAM 1 MG PO TABS
1.0000 mg | ORAL_TABLET | Freq: Three times a day (TID) | ORAL | Status: DC
Start: 2013-07-04 — End: 2013-07-24

## 2013-07-04 NOTE — Progress Notes (Signed)
Patient ID: [name], male   DOB: [birth year], 58 y.o.   MRN: [id number] Patient ID: [name], male   DOB: April 21, 1956, 58 y.o.   MRN: [id number] Patient ID: [name], male   DOB: September 04, 1955, 58 y.o.   MRN: [id number] [place] [address] Progress Note [name] MRN: [id number] DOB: [birth year] Age: 58 y.o.  Date: 07/04/2013 Start Time: 9:50 AM End Time: 10:05 AM  Chief Complaint: Chief Complaint  Patient presents with  . Anxiety  . Depression  . Follow-up    Subjective:  "I've been really sad . My cousin died"  This patient is a 57 year old married white male lives with his wife in [place]. He is on disability for coronary artery disease and COPD. He used to work for the [employer] but has not worked for the last 6 years. He has 3 step children 1 adopted child and several grandchildren.  The patient states he's been depressed and anxious primarily since he stopped working 6 years ago. He's also been through several deaths in his family his brother died suddenly 4 years ago. Following that his sister-in-law died in his home 8 months after his brother died. The patient's wife was caring for his wife's mother and she died last month at the age of 72. All of these things are disturbing to him. He's had trouble sleeping because he is thinking about the deaths. He's more depressed but not suicidal. He's not eating well and has lost about 20 pounds. He also has restless legs and chronic pain in his legs which keep him awake at night. He enjoys hunting and fishing but is less these interests recently.  The patient returns after 2 months. Since I last saw him his cousin died of an infection. They grew up together and it's been quite a loss for him. He's also had blood in his stool. He he tells me that this has happened before and he had colon polyps. He's been tired lately and I'm guessing he could be anemic. He promises me that he will call his GI specialist right away. He's not been  sleeping well and is still struggling with a daughter who is addicted to pills. He's not suicidal. I told her we could go up on his Zyprexa to help with his sleep. .  Diagnosis:   Axis I: Anxiety Disorder NOS and Major Depression, Recurrent severe Axis II: Deferred Axis III:  Past Medical History  Diagnosis Date  . ASCVD (arteriosclerotic cardiovascular disease)     inferior MI with circumflex PTCA in 1993;cath in 2000-50% OM 1&2,30% LAD ;2008-25% LAD, 80% nondominant right ,60% small OM 2,EF of 55% with severe basilar inferior hypokinesis; stress nuclear study in 04/2008 moderate inferior scar with peri-infarction ischemic  . Syncope   . Hyperlipidemia   . Hypertension   . Tobacco abuse     50 pack years continuing at one halp pack daily  . COPD (chronic obstructive pulmonary disease)     mild ;excercise induced hypoxemia by cp stress test ;asthma ,bronchitis,  . Restless leg syndrome   . Anxiety and depression   . GERD (gastroesophageal reflux disease)   . Inflammatory polyps of colon with rectal bleeding   . Carcinoma in situ of colon 2004    rectal polyp  . H. pylori infection 2004    treated  . Sleep apnea     does not use CPAP  . Gastritis 12/30/10    EGD Dr [name]  . Colitis,  ischemic 2011  . Tubular adenoma of colon 06/2009    Colonosocpy Dr [name]  . Leukocytosis     [name]  . Depression   . Anxiety   . Lung nodule 07/16/2011  . CAD (coronary artery disease)    Axis IV: other psychosocial or environmental problems Axis V: 41-50 serious symptoms  ADL's:  Intact  Sleep: Fair  Appetite:  Poor  Allergies: No Known Allergies Medical History: Past Medical History  Diagnosis Date  . ASCVD (arteriosclerotic cardiovascular disease)     inferior MI with circumflex PTCA in 1993;cath in 2000-50% OM 1&2,30% LAD ;2008-25% LAD, 80% nondominant right ,60% small OM 2,EF of 55% with severe basilar inferior hypokinesis; stress nuclear study in 04/2008 moderate  inferior scar with peri-infarction ischemic  . Syncope   . Hyperlipidemia   . Hypertension   . Tobacco abuse     50 pack years continuing at one halp pack daily  . COPD (chronic obstructive pulmonary disease)     mild ;excercise induced hypoxemia by cp stress test ;asthma ,bronchitis,  . Restless leg syndrome   . Anxiety and depression   . GERD (gastroesophageal reflux disease)   . Inflammatory polyps of colon with rectal bleeding   . Carcinoma in situ of colon 2004    rectal polyp  . H. pylori infection 2004    treated  . Sleep apnea     does not use CPAP  . Gastritis 12/30/10    EGD Dr [name]  . Colitis, ischemic 2011  . Tubular adenoma of colon 06/2009    Colonosocpy Dr [name]  . Leukocytosis     [name]  . Depression   . Anxiety   . Lung nodule 07/16/2011  . CAD (coronary artery disease)    Surgical History: Past Surgical History  Procedure Laterality Date  . Colonoscopy w/ polypectomy  2004    rectal polyp with carcinoma in situ removed via colonoscopy  . Hand surgery      surgical intervention for injury of the fingers of the left hand many years ago  . Colonoscopy  06/2009    normal terminal ileum, segmental mild inflammation of sigmoid colon (bx unremarkable), polyp, tubular adenoma  . Esophagogastroduodenoscopy  02/2009    query Barrett's but bx negative  . Esophagogastroduodenoscopy  12/30/2010    [name],gastritis, dilated 77F, sm HH, 1 small ulcer, Duodenal erosions, benign bx  . Flexible sigmoidoscopy  12/30/2010     [name],; internal hemorrhoids, anal papilla  . Heart stent    . Nasal septoplasty w/ turbinoplasty  10/06/2011    Procedure: NASAL SEPTOPLASTY WITH TURBINATE REDUCTION;  Surgeon: Izora Gala, MD;  Location: [place];  Service: ENT;  Laterality: Bilateral;  . Cardiac catheterization    . Coronary angioplasty with stent placement  01/26/2012    "1; total is now 2"  . Esophagogastroduodenoscopy (egd) with esophageal dilation N/A  09/12/2012    [id number] Schatzki's ring s/p Maloney dilator. Small hiatal hernia  . Cholecystectomy  2004   Family History: family history includes Cancer in his maternal aunt and paternal uncle; Depression in his mother; Heart disease in his mother; Hypertension in his brother; Kidney failure in his maternal uncle; Lung disease in his father. There is no history of Colon cancer, ADD / ADHD, Alcohol abuse, Drug abuse, Anxiety disorder, Bipolar disorder, Dementia, OCD, Paranoid behavior, Schizophrenia, Physical abuse, Sexual abuse, or Seizures. Reviewed and nothing new today.  Suicidal Ideation:  Pt has thoughts that are  off and on, but not too bad lately Homicidal Ideation:  Pt has some struggles with his daughter's boy friend.  He stays away from him. AEB (as evidenced by):per pt report  Psychiatric Specialty Exam: ROS Neuro: no headaches, ataxia, weakness, gives out easily GI: no N/V/cramps/constipation, he keeps diarrhea for the past 6 or 7 years MS: no weakness or aches. He catches cramps really bad.   Blood pressure 120/80, height 5\' 10"  (1.778 m), weight 166 lb (75.297 kg).Body mass index is 23.82 kg/(m^2).  General Appearance: Casual  Eye Contact::  Fair  Speech:  Clear and Coherent  Volume:  Normal  Mood:  Anxious ,    Affect:  Congruent  Thought Process:  Coherent, Linear and Logical  Orientation:  Full (Time, Place, and Person)  Thought Content:  Within normal limits   Suicidal Thoughts:  no  Homicidal Thoughts:  No  Memory:  Immediate;   Fair Recent;   Fair Remote;   Fair  Judgement:  Fair  Insight:  Fair  Psychomotor Activity:  Normal  Concentration:  Fair  Recall:  Fair  Akathisia:  No  Handed:  Right  AIMS (if indicated):     Assets:  Communication Skills Desire for Improvement  Sleep:      Current Medications: Zyprexa 5 mg at bed time Xanax 1 mg TID Neurontin 600 mg 3 times a day  Lab Results: No results found for this or any previous visit (from  the past 48 hour(s)). Cardiologist draws periodic labs.  He has not heard back on the last set.  They usually tell him if there are concerns.  Physical Findings: AIMS:  , ,  ,  ,    CIWA:    COWS:     Treatment Plan Summary: Medication management  Plan: I took his vitals.  I reviewed CC, tobacco/med/surg Hx, meds effects/ side effects, problem list, therapies and responses as well as current situation/symptoms discussed options. Continue current medications but increase Zyprexa to 10 mg at bedtime. He'll return to see me in 2 months but call if symptoms worsen before that See orders and pt instructions for more details.  MEDICATIONS this encounter: Meds ordered this encounter  Medications  . FLUoxetine (PROZAC) 40 MG capsule    Sig: Take 1 capsule (40 mg total) by mouth daily.    Dispense:  30 capsule    Refill:  2  . OLANZapine (ZYPREXA) 10 MG tablet    Sig: Take 1 tablet (10 mg total) by mouth at bedtime.    Dispense:  30 tablet    Refill:  2  . gabapentin (NEURONTIN) 300 MG capsule    Sig: Take 2 capsules (600 mg total) by mouth 3 (three) times daily.    Dispense:  180 capsule    Refill:  2  . ALPRAZolam (XANAX) 1 MG tablet    Sig: Take 1 tablet (1 mg total) by mouth 3 (three) times daily.    Dispense:  90 tablet    Refill:  2    Medical Decision Making Problem Points:  Established problem, stable/improving (1), Review of last therapy session (1) and Review of psycho-social stressors (1) Data Points:  Review or order clinical lab tests (1) Review of medication regiment & side effects (2)  I certify that outpatient services furnished can reasonably be expected to improve the patient's condition.   Haset [place], [facility] 07/04/2013, 8:57 AM

## 2013-07-11 ENCOUNTER — Other Ambulatory Visit: Payer: Self-pay | Admitting: Cardiology

## 2013-07-23 ENCOUNTER — Ambulatory Visit (INDEPENDENT_AMBULATORY_CARE_PROVIDER_SITE_OTHER): Payer: BC Managed Care – PPO | Admitting: Gastroenterology

## 2013-07-23 ENCOUNTER — Encounter: Payer: Self-pay | Admitting: Gastroenterology

## 2013-07-23 VITALS — BP 120/77 | HR 72 | Temp 98.3°F | Ht 70.0 in | Wt 172.2 lb

## 2013-07-23 DIAGNOSIS — K625 Hemorrhage of anus and rectum: Secondary | ICD-10-CM

## 2013-07-23 DIAGNOSIS — R110 Nausea: Secondary | ICD-10-CM

## 2013-07-23 MED ORDER — HYDROCORTISONE 2.5 % RE CREA: 1.0000 "application " | TOPICAL_CREAM | Freq: Two times a day (BID) | RECTAL | Status: DC

## 2013-07-23 MED ORDER — ONDANSETRON 4 MG PO TBDP: 4.0000 mg | ORAL_TABLET | Freq: Three times a day (TID) | ORAL | Status: DC

## 2013-07-23 MED ORDER — PEG 3350-KCL-NA BICARB-NACL 420 G PO SOLR: 4000.0000 mL | ORAL | Status: DC

## 2013-07-23 NOTE — Assessment & Plan Note (Addendum)
58 year old male with several week history of rectal bleeding, now tapering off in frequency, with associated nausea reported. Unclear if the two entities are related; however, last full lower GI evaluation in Jan 2011. He has a remote history of rectal polyp with carcinoma in situ. Declined rectal exam today. Warrants early interval colonoscopy at this point.   Proceed with TCS with Dr. [name] in near future: the risks, benefits, and alternatives have been discussed with the patient in detail. The patient states understanding and desires to proceed. Phenergan 25 mg IV on call due to polypharmacy Anusol cream provided

## 2013-07-23 NOTE — Assessment & Plan Note (Signed)
Unclear etiology. EGD on file from last year; patient with early satiety, poor appetite. Nausea onset around time of rectal bleeding. Question if coincidental or truly related. Colonoscopy as planned; if no significant findings, consider GES. Zofran provided for supportive measures.

## 2013-07-23 NOTE — Patient Instructions (Signed)
Please complete blood work today. We will call with the results.  I have scheduled you for a colonoscopy with Dr. [name] in the near future.  I have also sent suppositories to your pharmacy to use twice a day for 7 days. This is to help with any inflammation in your rectum that could be causing the bleeding.

## 2013-07-23 NOTE — Progress Notes (Signed)
Referring Provider: [name], MD Primary Care Physician:  Sallee Lange, MD Primary GI: Dr. [name]   Chief Complaint  Patient presents with  . Rectal Bleeding    HPI:   [name] presents today with rectal bleeding. Has history of chronic diarrhea with flex sig in July 2012. Surveillance colonoscopy due in June 2016. Last full lower GI evaluation in Jan 2011. Remote history of rectal polyp with carcinoma in situ.  Also notable history of chronic abdominal pain, weight loss. Weight actually stable in the 170s. Last EGD in April 2014 with Schatzki's ring s/p dilation, small hiatal hernia, negative path.   Notes onset of rectal bleeding about 4-5 weeks ago. Sometimes burgundy color, sometimes bright red blood. Feels nauseated, difficulty eating since bleeding started. Poor appetite. Gets full off of small amounts of food.  Only drinking tea. States he has lost about 10 lbs. States he had been about 176. Today 172. Sometimes eating only one meal a day. BM every morning, sometimes 2-3 times per day. States usually diarrhea. States rectal bleeding was with every bowel movement at the beginning, but now it is tapering off. Hasn't seen anything in the last few days. Sometimes rectal burning. Denies any itching, obvious hemorrhoids.   States upper abdomen stays "tender". Protonix daily. Pepcid daily.    Past Medical History  Diagnosis Date  . ASCVD (arteriosclerotic cardiovascular disease)     inferior MI with circumflex PTCA in 1993;cath in 2000-50% OM 1&2,30% LAD ;2008-25% LAD, 80% nondominant right ,60% small OM 2,EF of 55% with severe basilar inferior hypokinesis; stress nuclear study in 04/2008 moderate inferior scar with peri-infarction ischemic  . Syncope   . Hyperlipidemia   . Hypertension   . Tobacco abuse     50 pack years continuing at one halp pack daily  . COPD (chronic obstructive pulmonary disease)     mild ;excercise induced hypoxemia by cp stress test ;asthma  ,bronchitis,  . Restless leg syndrome   . Anxiety and depression   . GERD (gastroesophageal reflux disease)   . Inflammatory polyps of colon with rectal bleeding   . Carcinoma in situ of colon 2004    rectal polyp  . H. pylori infection 2004    treated  . Sleep apnea     does not use CPAP  . Gastritis 12/30/10    EGD Dr [name]  . Colitis, ischemic 2011  . Tubular adenoma of colon 06/2009    Colonosocpy Dr [name]  . Leukocytosis     [name]  . Depression   . Anxiety   . Lung nodule 07/16/2011  . CAD (coronary artery disease)     Past Surgical History  Procedure Laterality Date  . Colonoscopy w/ polypectomy  2004    rectal polyp with carcinoma in situ removed via colonoscopy  . Hand surgery      surgical intervention for injury of the fingers of the left hand many years ago  . Colonoscopy  06/2009    normal terminal ileum, segmental mild inflammation of sigmoid colon (bx unremarkable), polyp, tubular adenoma  . Esophagogastroduodenoscopy  02/2009    query Barrett's but bx negative  . Esophagogastroduodenoscopy  12/30/2010    [name],gastritis, dilated 67F, sm HH, 1 small ulcer, Duodenal erosions, benign bx  . Flexible sigmoidoscopy  12/30/2010     [name],; internal hemorrhoids, anal papilla  . Heart stent    . Nasal septoplasty w/ turbinoplasty  10/06/2011    Procedure: NASAL  SEPTOPLASTY WITH TURBINATE REDUCTION;  Surgeon: Izora Gala, MD;  Location: [facility];  Service: ENT;  Laterality: Bilateral;  . Cardiac catheterization    . Coronary angioplasty with stent placement  01/26/2012    "1; total is now 2"  . Esophagogastroduodenoscopy (egd) with esophageal dilation N/A 09/12/2012    [id number] Schatzki's ring s/p Maloney dilator. Small hiatal hernia. negative path  . Cholecystectomy  2004    Current Outpatient Prescriptions  Medication Sig Dispense Refill  . albuterol (PROVENTIL) (2.5 MG/3ML) 0.083% nebulizer solution Take 2.5 mg by nebulization every 6  (six) hours as needed. For shortness of breath      . albuterol-ipratropium (COMBIVENT) 18-103 MCG/ACT inhaler Inhale 2 puffs into the lungs every 6 (six) hours as needed. For shortness of breath      . ALPRAZolam (XANAX) 1 MG tablet Take 1 tablet (1 mg total) by mouth 3 (three) times daily.  90 tablet  2  . aspirin EC 81 MG tablet Take 81 mg by mouth daily.       . budesonide-formoterol (SYMBICORT) 80-4.5 MCG/ACT inhaler Inhale 2 puffs into the lungs 2 (two) times daily.        . famotidine (PEPCID) 20 MG tablet Take 1 tablet (20 mg total) by mouth daily.  30 tablet  3  . FLUoxetine (PROZAC) 40 MG capsule Take 1 capsule (40 mg total) by mouth daily.  30 capsule  2  . gabapentin (NEURONTIN) 300 MG capsule Take 2 capsules (600 mg total) by mouth 3 (three) times daily.  180 capsule  2  . HYDROcodone-acetaminophen (NORCO) 7.5-325 MG per tablet Take 1 tablet by mouth every 8 (eight) hours as needed.  90 tablet  0  . metoprolol succinate (TOPROL-XL) 25 MG 24 hr tablet TAKE 1 TABLET BY MOUTH EVERY DAY  30 tablet  6  . OLANZapine (ZYPREXA) 10 MG tablet Take 1 tablet (10 mg total) by mouth at bedtime.  30 tablet  2  . pantoprazole (PROTONIX) 40 MG tablet TAKE 1 TABLET BY MOUTH EVERY DAY  30 tablet  11  . pravastatin (PRAVACHOL) 40 MG tablet TAKE 1 TABLET BY MOUTH EVERY DAY  30 tablet  6  . promethazine (PHENERGAN) 25 MG suppository Place 25 mg rectally every 6 (six) hours as needed.      Marland Kitchen rOPINIRole (REQUIP) 0.5 MG tablet Take 0.5 mg by mouth 3 (three) times daily.      . verapamil (CALAN-SR) 180 MG CR tablet TAKE 1 TABLET EVERY DAY  30 tablet  10  . vitamin B-12 (CYANOCOBALAMIN) 1000 MCG tablet Take 2,000 mcg by mouth daily.      . nitroGLYCERIN (NITROSTAT) 0.4 MG SL tablet Place 1 tablet (0.4 mg total) under the tongue every 5 (five) minutes as needed for chest pain.  25 tablet  3  . ondansetron (ZOFRAN ODT) 4 MG disintegrating tablet Take 1 tablet (4 mg total) by mouth 3 (three) times daily with meals.   90 tablet  3  . [DISCONTINUED] amLODipine (NORVASC) 5 MG tablet Take 5 mg by mouth daily.        No current facility-administered medications for this visit.    Allergies as of 07/23/2013  . (No Known Allergies)    Family History  Problem Relation Age of Onset  . Lung disease Father     deceased, black lung  . Heart disease Mother     blood clots  . Depression Mother   . Cancer Paternal Uncle     unknown  type  . Colon cancer Neg Hx   . ADD / ADHD Neg Hx   . Alcohol abuse Neg Hx   . Drug abuse Neg Hx   . Anxiety disorder Neg Hx   . Bipolar disorder Neg Hx   . Dementia Neg Hx   . OCD Neg Hx   . Paranoid behavior Neg Hx   . Schizophrenia Neg Hx   . Physical abuse Neg Hx   . Sexual abuse Neg Hx   . Seizures Neg Hx   . Cancer Maternal Aunt     unknown type  . Kidney failure Maternal Uncle   . Hypertension Brother   . Colon cancer Neg Hx     History   Social History  . Marital Status: Married    Spouse Name: N/A    Number of Children: 3  . Years of Education: N/A   Occupational History  . disable     [employer]   Social History Main Topics  . Smoking status: Former Smoker -- 0.50 packs/day for 30 years    Types: Cigarettes  . Smokeless tobacco: Never Used     Comment: now using electronic cigarettes  . Alcohol Use: No     Comment: Drinks a beer occasionally  . Drug Use: No  . Sexual Activity: None   Other Topics Concern  . None   Social History Narrative   3 stepchildren    Review of Systems: Gen: feels fatigued CV: Denies chest pain, palpitations, syncope, peripheral edema, and claudication. Resp: +DOE GI: see HPI Derm: Denies rash, itching, dry skin Psych: +anxiety Heme: Denies bruising, bleeding, and enlarged lymph nodes.  Physical Exam: BP 120/77  Pulse 72  Temp(Src) 98.3 F (36.8 C) (Oral)  Ht 5\' 10"  (1.778 m)  Wt 172 lb 3.2 oz (78.109 kg)  BMI 24.71 kg/m2 General:   Alert and oriented. No distress noted. Pleasant and cooperative.  Head:   Normocephalic and atraumatic. Eyes:  Conjuctiva clear without scleral icterus. Mouth:  Oral mucosa pink and moist.  Heart:  S1, S2 present without murmurs, rubs, or gallops. Regular rate and rhythm. Abdomen:  +BS, soft, no TTP and non-distended. No rebound or guarding. No HSM or masses noted. Rectal: declined Msk:  Symmetrical without gross deformities. Normal posture. Extremities:  Without edema. Neurologic:  Alert and  oriented x4;  grossly normal neurologically. Skin:  Intact without significant lesions or rashes. Psych:  Alert and cooperative. Normal mood and affect.  CT abd/pelvis April 2014:  IMPRESSION:  1. No acute or focal abnormality to explain the patient's  symptoms.  2. New stable intrahepatic biliary dilation following  cholecystectomy.  3. Mild centrilobular emphysema.

## 2013-07-24 ENCOUNTER — Encounter (HOSPITAL_COMMUNITY): Payer: Self-pay

## 2013-07-24 LAB — CBC WITH DIFFERENTIAL/PLATELET
Basophils Absolute: 0 10*3/uL (ref 0.0–0.1)
Basophils Relative: 0 % (ref 0–1)
Eosinophils Absolute: 0.1 10*3/uL (ref 0.0–0.7)
Eosinophils Relative: 1 % (ref 0–5)
HCT: 44.8 % (ref 39.0–52.0)
HEMOGLOBIN: 15.9 g/dL (ref 13.0–17.0)
LYMPHS ABS: 2.8 10*3/uL (ref 0.7–4.0)
Lymphocytes Relative: 34 % (ref 12–46)
MCH: 31.5 pg (ref 26.0–34.0)
MCHC: 35.5 g/dL (ref 30.0–36.0)
MCV: 88.7 fL (ref 78.0–100.0)
MONOS PCT: 5 % (ref 3–12)
Monocytes Absolute: 0.4 10*3/uL (ref 0.1–1.0)
NEUTROS ABS: 5 10*3/uL (ref 1.7–7.7)
NEUTROS PCT: 60 % (ref 43–77)
PLATELETS: 199 10*3/uL (ref 150–400)
RBC: 5.05 MIL/uL (ref 4.22–5.81)
RDW: 14.3 % (ref 11.5–15.5)
WBC: 8.4 10*3/uL (ref 4.0–10.5)

## 2013-07-24 NOTE — Progress Notes (Signed)
cc'd to pcp 

## 2013-07-26 ENCOUNTER — Ambulatory Visit (INDEPENDENT_AMBULATORY_CARE_PROVIDER_SITE_OTHER): Payer: BC Managed Care – PPO | Admitting: Family Medicine

## 2013-07-26 ENCOUNTER — Encounter: Payer: Self-pay | Admitting: Family Medicine

## 2013-07-26 VITALS — BP 128/80 | Ht 70.0 in | Wt 175.0 lb

## 2013-07-26 DIAGNOSIS — I10 Essential (primary) hypertension: Secondary | ICD-10-CM

## 2013-07-26 DIAGNOSIS — E785 Hyperlipidemia, unspecified: Secondary | ICD-10-CM

## 2013-07-26 DIAGNOSIS — J4489 Other specified chronic obstructive pulmonary disease: Secondary | ICD-10-CM

## 2013-07-26 DIAGNOSIS — G2581 Restless legs syndrome: Secondary | ICD-10-CM

## 2013-07-26 DIAGNOSIS — J449 Chronic obstructive pulmonary disease, unspecified: Secondary | ICD-10-CM

## 2013-07-26 MED ORDER — BUDESONIDE-FORMOTEROL FUMARATE 80-4.5 MCG/ACT IN AERO: 2.0000 | INHALATION_SPRAY | Freq: Two times a day (BID) | RESPIRATORY_TRACT | Status: DC

## 2013-07-26 MED ORDER — ALPRAZOLAM 1 MG PO TABS: 1.0000 mg | ORAL_TABLET | Freq: Three times a day (TID) | ORAL | Status: DC | PRN

## 2013-07-26 MED ORDER — HYDROCODONE-ACETAMINOPHEN 7.5-325 MG PO TABS: 1.0000 | ORAL_TABLET | Freq: Three times a day (TID) | ORAL | Status: DC | PRN

## 2013-07-26 NOTE — Progress Notes (Signed)
Subjective:    Patient ID: [name], male    DOB: 16-Aug-1955, 59 y.o.   MRN: [id number]  HPI Patient is here today for 3 month check up. HTN-paced H. he does try to eat a heart healthy diet. He avoids excessive salt. He does try to stay somewhat active although the cold weather bothers him. He denies swelling in the legs  COPD-patient is using electronic cigarette currently he is trying his best to gradually titrate back downwards so he can be off of it. He does state that he has some shortness of breath with moderate activity.  He is getting a colonoscopy next Tuesday. Dr Sydell Axon patient's had some intermittent rectal bleeding and it was recommended for him to have a colonoscopy. I told him it was a good idea to do so.  He needs a refill on his pain meds. This patient was seen today for chronic pain  The medication list was reviewed and updated.  Discussion was held with the patient regarding compliance with pain medication. The patient was advised the importance of maintaining medication and not using illegal substances with these. The patient was educated that we can provide 3 monthly scripts for their medication, it is their responsibility to follow the instructions. Discussion was held with the patient to make sure they're not having significant side effects. Patient is aware that pain medications are meant to minimize the severity of the pain to allow their pain levels to improve to allow for better function. They are aware of that pain medications cannot totally remove their pain.   Restless legs-he states that the medication is helping he takes on a regular basis. Patient also uses his anxiety medication he denies abusing it states it does not cause drowsiness.    Review of Systems  Constitutional: Negative for fever, activity change and appetite change.  HENT: Negative for congestion and rhinorrhea.   Eyes: Negative for discharge.  Respiratory: Positive for shortness of  breath. Negative for cough and wheezing.   Cardiovascular: Negative for chest pain.  Gastrointestinal: Negative for vomiting, abdominal pain and blood in stool.  Genitourinary: Negative for frequency and difficulty urinating.  Musculoskeletal: Negative for neck pain.  Skin: Negative for rash.  Allergic/Immunologic: Negative for environmental allergies and food allergies.  Neurological: Negative for weakness and headaches.  Psychiatric/Behavioral: Negative for dysphoric mood and agitation.       Objective:   Physical Exam  Constitutional: He appears well-developed and well-nourished.  HENT:  Head: Normocephalic and atraumatic.  Right Ear: External ear normal.  Left Ear: External ear normal.  Nose: Nose normal.  Mouth/Throat: Oropharynx is clear and moist.  Eyes: EOM are normal. Pupils are equal, round, and reactive to light.  Neck: Normal range of motion. Neck supple. No thyromegaly present.  Cardiovascular: Normal rate, regular rhythm and normal heart sounds.   No murmur heard. Pulmonary/Chest: Effort normal and breath sounds normal. No respiratory distress. He has no wheezes.  Abdominal: Soft. Bowel sounds are normal. He exhibits no distension and no mass. There is no tenderness.  Genitourinary: Penis normal.  Musculoskeletal: Normal range of motion. He exhibits no edema.  Lymphadenopathy:    He has no cervical adenopathy.  Neurological: He is alert. He exhibits normal muscle tone.  Skin: Skin is warm and dry. No erythema.  Psychiatric: He has a normal mood and affect. His behavior is normal. Judgment normal.          Assessment & Plan:  COPD- symbicort , he will  use this twice daily. Albuterol to when necessary basis. Avoid tobacco. Gradually titrate down electronic cigarettes. Regular physical activity recommended. If fevers or other progressive troubles followup immediately.  HTN-heart healthy diet. Avoid excessive salt use. Regular exercise. Continue medication. Blood  pressures are good.  Pain-he is responsibly taking his medication. He needs to continue this. 3 prescriptions were given. He was warned about drowsiness. He will followup 3 months.  Hyperlip-he will continue his medication. Lab work in the late spring. He was encouraged to eat healthy. He denies any coronary artery disease symptoms or

## 2013-07-31 ENCOUNTER — Encounter (HOSPITAL_COMMUNITY)
Admission: RE | Disposition: A | Discharge status: Home / Self Care | Payer: Self-pay | Source: Ambulatory Visit | Attending: Internal Medicine

## 2013-07-31 ENCOUNTER — Encounter (HOSPITAL_COMMUNITY): Payer: Self-pay | Admitting: *Deleted

## 2013-07-31 ENCOUNTER — Ambulatory Visit (HOSPITAL_COMMUNITY)
Admission: RE | Admit: 2013-07-31 | Discharge: 2013-07-31 | Disposition: A | Discharge status: Home / Self Care | Payer: BC Managed Care – PPO | Source: Ambulatory Visit | Attending: Internal Medicine | Admitting: Internal Medicine

## 2013-07-31 DIAGNOSIS — K921 Melena: Secondary | ICD-10-CM | POA: Insufficient documentation

## 2013-07-31 DIAGNOSIS — J4489 Other specified chronic obstructive pulmonary disease: Secondary | ICD-10-CM | POA: Insufficient documentation

## 2013-07-31 DIAGNOSIS — K625 Hemorrhage of anus and rectum: Secondary | ICD-10-CM

## 2013-07-31 DIAGNOSIS — I10 Essential (primary) hypertension: Secondary | ICD-10-CM | POA: Insufficient documentation

## 2013-07-31 DIAGNOSIS — R197 Diarrhea, unspecified: Secondary | ICD-10-CM

## 2013-07-31 DIAGNOSIS — J449 Chronic obstructive pulmonary disease, unspecified: Secondary | ICD-10-CM | POA: Insufficient documentation

## 2013-07-31 DIAGNOSIS — K5730 Diverticulosis of large intestine without perforation or abscess without bleeding: Secondary | ICD-10-CM | POA: Insufficient documentation

## 2013-07-31 DIAGNOSIS — D126 Benign neoplasm of colon, unspecified: Secondary | ICD-10-CM

## 2013-07-31 DIAGNOSIS — K649 Unspecified hemorrhoids: Secondary | ICD-10-CM | POA: Insufficient documentation

## 2013-07-31 DIAGNOSIS — R1310 Dysphagia, unspecified: Secondary | ICD-10-CM | POA: Insufficient documentation

## 2013-07-31 DIAGNOSIS — K222 Esophageal obstruction: Secondary | ICD-10-CM | POA: Insufficient documentation

## 2013-07-31 HISTORY — PX: ESOPHAGOGASTRODUODENOSCOPY (EGD) WITH ESOPHAGEAL DILATION: SHX5812

## 2013-07-31 HISTORY — PX: COLONOSCOPY: SHX5424

## 2013-07-31 SURGERY — COLONOSCOPY
Anesthesia: Moderate Sedation

## 2013-07-31 MED ORDER — MEPERIDINE HCL 100 MG/ML IJ SOLN
INTRAMUSCULAR | Status: DC | PRN
  Administered 2013-07-31: 25 mg via INTRAVENOUS
  Administered 2013-07-31: 50 mg via INTRAVENOUS

## 2013-07-31 MED ORDER — MEPERIDINE HCL 100 MG/ML IJ SOLN
INTRAMUSCULAR | Status: AC
  Filled 2013-07-31: qty 2

## 2013-07-31 MED ORDER — PROMETHAZINE HCL 25 MG/ML IJ SOLN
25.0000 mg | Freq: Once | INTRAMUSCULAR | Status: AC
  Administered 2013-07-31: 25 mg via INTRAVENOUS

## 2013-07-31 MED ORDER — MIDAZOLAM HCL 5 MG/5ML IJ SOLN
INTRAMUSCULAR | Status: DC | PRN
  Administered 2013-07-31 (×2): 2 mg via INTRAVENOUS
  Administered 2013-07-31: 1 mg via INTRAVENOUS

## 2013-07-31 MED ORDER — ONDANSETRON HCL 4 MG/2ML IJ SOLN
INTRAMUSCULAR | Status: AC
  Filled 2013-07-31: qty 2

## 2013-07-31 MED ORDER — LIDOCAINE VISCOUS 2 % MT SOLN
OROMUCOSAL | Status: AC
  Filled 2013-07-31: qty 15

## 2013-07-31 MED ORDER — SODIUM CHLORIDE 0.9 % IJ SOLN
INTRAMUSCULAR | Status: AC
  Filled 2013-07-31: qty 10

## 2013-07-31 MED ORDER — MIDAZOLAM HCL 5 MG/5ML IJ SOLN
INTRAMUSCULAR | Status: AC
  Filled 2013-07-31: qty 10

## 2013-07-31 MED ORDER — STERILE WATER FOR IRRIGATION IR SOLN
Status: DC | PRN
  Administered 2013-07-31: 10:00:00

## 2013-07-31 MED ORDER — LIDOCAINE VISCOUS 2 % MT SOLN
OROMUCOSAL | Status: DC | PRN
  Administered 2013-07-31: 3 mL via OROMUCOSAL

## 2013-07-31 MED ORDER — SODIUM CHLORIDE 0.9 % IV SOLN
INTRAVENOUS | Status: DC
  Administered 2013-07-31: 09:00:00 via INTRAVENOUS

## 2013-07-31 MED ORDER — PROMETHAZINE HCL 25 MG/ML IJ SOLN
INTRAMUSCULAR | Status: AC
  Filled 2013-07-31: qty 1

## 2013-07-31 MED ORDER — ONDANSETRON HCL 4 MG/2ML IJ SOLN
INTRAMUSCULAR | Status: DC | PRN
  Administered 2013-07-31: 4 mg via INTRAVENOUS

## 2013-07-31 NOTE — Interval H&P Note (Signed)
History and Physical Interval Note:  07/31/2013 10:21 AM  [name]  has presented today for surgery, with the diagnosis of RECTAL BLEEDING  The various methods of treatment have been discussed with the patient and family. After consideration of risks, benefits and other options for treatment, the patient has consented to  Procedure(s) with comments: COLONOSCOPY (N/A) - 1:15 as a surgical intervention .  The patient's history has been reviewed, patient examined, no change in status, stable for surgery.  I have reviewed the patient's chart and labs.  Questions were answered to the patient's satisfaction.   \  Colonoscopy per plan. No change.  Patient also reports recurrent esophageal dysphagia. Having trouble getting his pills stuck. Schatzki's ring dilated last year.  After discussion, have offered patient both a colonoscopy and EGD with dilation as appropriate today.The risks, benefits, limitations, imponderables and alternatives regarding both EGD and colonoscopy have been reviewed with the patient. Questions have been answered. All parties agreeable.   [name]

## 2013-07-31 NOTE — Discharge Instructions (Addendum)
GERD information provided  Continue Protonix 40 mg daily  Diverticulosis, hemorrhoid and polyp information provided  Further recommendations to follow pending review of pathology report  My office will schedule hemorrhoid banding session in the office in the near future.   Colonoscopy Discharge Instructions  Read the instructions outlined below and refer to this sheet in the next few weeks. These discharge instructions provide you with general information on caring for yourself after you leave the hospital. Your doctor may also give you specific instructions. While your treatment has been planned according to the most current medical practices available, unavoidable complications occasionally occur. If you have any problems or questions after discharge, call Dr. [name] at [phone]. ACTIVITY  You may resume your regular activity, but move at a slower pace for the next 24 hours.   Take frequent rest periods for the next 24 hours.   Walking will help get rid of the air and reduce the bloated feeling in your belly (abdomen).   No driving for 24 hours (because of the medicine (anesthesia) used during the test).    Do not sign any important legal documents or operate any machinery for 24 hours (because of the anesthesia used during the test).  NUTRITION  Drink plenty of fluids.   You may resume your normal diet as instructed by your doctor.   Begin with a light meal and progress to your normal diet. Heavy or fried foods are harder to digest and may make you feel sick to your stomach (nauseated).   Avoid alcoholic beverages for 24 hours or as instructed.  MEDICATIONS  You may resume your normal medications unless your doctor tells you otherwise.  WHAT YOU CAN EXPECT TODAY  Some feelings of bloating in the abdomen.   Passage of more gas than usual.   Spotting of blood in your stool or on the toilet paper.  IF YOU HAD POLYPS REMOVED DURING THE COLONOSCOPY:  No aspirin products  for 7 days or as instructed.   No alcohol for 7 days or as instructed.   Eat a soft diet for the next 24 hours.  FINDING OUT THE RESULTS OF YOUR TEST Not all test results are available during your visit. If your test results are not back during the visit, make an appointment with your caregiver to find out the results. Do not assume everything is normal if you have not heard from your caregiver or the medical facility. It is important for you to follow up on all of your test results.  SEEK IMMEDIATE MEDICAL ATTENTION IF:  You have more than a spotting of blood in your stool.   Your belly is swollen (abdominal distention).   You are nauseated or vomiting.   You have a temperature over 101.   You have abdominal pain or discomfort that is severe or gets worse throughout the day.  EGD Discharge instructions Please read the instructions outlined below and refer to this sheet in the next few weeks. These discharge instructions provide you with general information on caring for yourself after you leave the hospital. Your doctor may also give you specific instructions. While your treatment has been planned according to the most current medical practices available, unavoidable complications occasionally occur. If you have any problems or questions after discharge, please call your doctor. ACTIVITY You may resume your regular activity but move at a slower pace for the next 24 hours.  Take frequent rest periods for the next 24 hours.  Walking will help expel (get  rid of) the air and reduce the bloated feeling in your abdomen.  No driving for 24 hours (because of the anesthesia (medicine) used during the test).  You may shower.  Do not sign any important legal documents or operate any machinery for 24 hours (because of the anesthesia used during the test).  NUTRITION Drink plenty of fluids.  You may resume your normal diet.  Begin with a light meal and progress to your normal diet.  Avoid  alcoholic beverages for 24 hours or as instructed by your caregiver.  MEDICATIONS You may resume your normal medications unless your caregiver tells you otherwise.  WHAT YOU CAN EXPECT TODAY You may experience abdominal discomfort such as a feeling of fullness or gas pains.  FOLLOW-UP Your doctor will discuss the results of your test with you.  SEEK IMMEDIATE MEDICAL ATTENTION IF ANY OF THE FOLLOWING OCCUR: Excessive nausea (feeling sick to your stomach) and/or vomiting.  Severe abdominal pain and distention (swelling).  Trouble swallowing.  Temperature over 101 F (37.8 C).  Rectal bleeding or vomiting of blood.    Gastroesophageal Reflux Disease, Adult Gastroesophageal reflux disease (GERD) happens when acid from your stomach flows up into the esophagus. When acid comes in contact with the esophagus, the acid causes soreness (inflammation) in the esophagus. Over time, GERD may create small holes (ulcers) in the lining of the esophagus. CAUSES   Increased body weight. This puts pressure on the stomach, making acid rise from the stomach into the esophagus.  Smoking. This increases acid production in the stomach.  Drinking alcohol. This causes decreased pressure in the lower esophageal sphincter (valve or ring of muscle between the esophagus and stomach), allowing acid from the stomach into the esophagus.  Late evening meals and a full stomach. This increases pressure and acid production in the stomach.  A malformed lower esophageal sphincter. Sometimes, no cause is found. SYMPTOMS   Burning pain in the lower part of the mid-chest behind the breastbone and in the mid-stomach area. This may occur twice a week or more often.  Trouble swallowing.  Sore throat.  Dry cough.  Asthma-like symptoms including chest tightness, shortness of breath, or wheezing. DIAGNOSIS  Your caregiver may be able to diagnose GERD based on your symptoms. In some cases, X-rays and other tests may be  done to check for complications or to check the condition of your stomach and esophagus. TREATMENT  Your caregiver may recommend over-the-counter or prescription medicines to help decrease acid production. Ask your caregiver before starting or adding any new medicines.  HOME CARE INSTRUCTIONS   Change the factors that you can control. Ask your caregiver for guidance concerning weight loss, quitting smoking, and alcohol consumption.  Avoid foods and drinks that make your symptoms worse, such as:  Caffeine or alcoholic drinks.  Chocolate.  Peppermint or mint flavorings.  Garlic and onions.  Spicy foods.  Citrus fruits, such as oranges, lemons, or limes.  Tomato-based foods such as sauce, chili, salsa, and pizza.  Fried and fatty foods.  Avoid lying down for the 3 hours prior to your bedtime or prior to taking a nap.  Eat small, frequent meals instead of large meals.  Wear loose-fitting clothing. Do not wear anything tight around your waist that causes pressure on your stomach.  Raise the head of your bed 6 to 8 inches with wood blocks to help you sleep. Extra pillows will not help.  Only take over-the-counter or prescription medicines for pain, discomfort, or fever as directed  by your caregiver.  Do not take aspirin, ibuprofen, or other nonsteroidal anti-inflammatory drugs (NSAIDs). SEEK IMMEDIATE MEDICAL CARE IF:   You have pain in your arms, neck, jaw, teeth, or back.  Your pain increases or changes in intensity or duration.  You develop nausea, vomiting, or sweating (diaphoresis).  You develop shortness of breath, or you faint.  Your vomit is green, yellow, black, or looks like coffee grounds or blood.  Your stool is red, bloody, or black. These symptoms could be signs of other problems, such as heart disease, gastric bleeding, or esophageal bleeding. MAKE SURE YOU:   Understand these instructions.  Will watch your condition.  Will get help right away if you  are not doing well or get worse. Document Released: 03/10/2005 Document Revised: 08/23/2011 Document Reviewed: 12/18/2010 [facility] Patient Information 2014 [place], [place].   Diverticulosis Diverticulosis is a common condition that develops when small pouches (diverticula) form in the wall of the colon. The risk of diverticulosis increases with age. It happens more often in people who eat a low-fiber diet. Most individuals with diverticulosis have no symptoms. Those individuals with symptoms usually experience abdominal pain, constipation, or loose stools (diarrhea). HOME CARE INSTRUCTIONS   Increase the amount of fiber in your diet as directed by your caregiver or dietician. This may reduce symptoms of diverticulosis.  Your caregiver may recommend taking a dietary fiber supplement.  Drink at least 6 to 8 glasses of water each day to prevent constipation.  Try not to strain when you have a bowel movement.  Your caregiver may recommend avoiding nuts and seeds to prevent complications, although this is still an uncertain benefit.  Only take over-the-counter or prescription medicines for pain, discomfort, or fever as directed by your caregiver. FOODS WITH HIGH FIBER CONTENT INCLUDE:  Fruits. Apple, peach, pear, tangerine, raisins, prunes.  Vegetables. Brussels sprouts, asparagus, broccoli, cabbage, carrot, cauliflower, romaine lettuce, spinach, summer squash, tomato, winter squash, zucchini.  Starchy Vegetables. Baked beans, kidney beans, lima beans, split peas, lentils, potatoes (with skin).  Grains. Whole wheat bread, brown rice, bran flake cereal, plain oatmeal, Nyomi Howser rice, shredded wheat, bran muffins. SEEK IMMEDIATE MEDICAL CARE IF:   You develop increasing pain or severe bloating.  You have an oral temperature above 102 F (38.9 C), not controlled by medicine.  You develop vomiting or bowel movements that are bloody or black. Document Released: 02/26/2004 Document Revised:  08/23/2011 Document Reviewed: 10/29/2009 [facility] Patient Information 2014 [place].   Hemorrhoids Hemorrhoids are swollen veins around the rectum or anus. There are two types of hemorrhoids:   Internal hemorrhoids. These occur in the veins just inside the rectum. They may poke through to the outside and become irritated and painful.  External hemorrhoids. These occur in the veins outside the anus and can be felt as a painful swelling or hard lump near the anus. CAUSES  Pregnancy.   Obesity.   Constipation or diarrhea.   Straining to have a bowel movement.   Sitting for long periods on the toilet.  Heavy lifting or other activity that caused you to strain.  Anal intercourse. SYMPTOMS   Pain.   Anal itching or irritation.   Rectal bleeding.   Fecal leakage.   Anal swelling.   One or more lumps around the anus.  DIAGNOSIS  Your caregiver may be able to diagnose hemorrhoids by visual examination. Other examinations or tests that may be performed include:   Examination of the rectal area with a gloved hand (digital rectal exam).  Examination of anal canal using a small tube (scope).   A blood test if you have lost a significant amount of blood.  A test to look inside the colon (sigmoidoscopy or colonoscopy). TREATMENT Most hemorrhoids can be treated at home. However, if symptoms do not seem to be getting better or if you have a lot of rectal bleeding, your caregiver may perform a procedure to help make the hemorrhoids get smaller or remove them completely. Possible treatments include:   Placing a rubber band at the base of the hemorrhoid to cut off the circulation (rubber band ligation).   Injecting a chemical to shrink the hemorrhoid (sclerotherapy).   Using a tool to burn the hemorrhoid (infrared light therapy).   Surgically removing the hemorrhoid (hemorrhoidectomy).   Stapling the hemorrhoid to block blood flow to the tissue  (hemorrhoid stapling).  HOME CARE INSTRUCTIONS   Eat foods with fiber, such as whole grains, beans, nuts, fruits, and vegetables. Ask your doctor about taking products with added fiber in them (fibersupplements).  Increase fluid intake. Drink enough water and fluids to keep your urine clear or pale yellow.   Exercise regularly.   Go to the bathroom when you have the urge to have a bowel movement. Do not wait.   Avoid straining to have bowel movements.   Keep the anal area dry and clean. Use wet toilet paper or moist towelettes after a bowel movement.   Medicated creams and suppositories may be used or applied as directed.   Only take over-the-counter or prescription medicines as directed by your caregiver.   Take warm sitz baths for 15 20 minutes, 3 4 times a day to ease pain and discomfort.   Place ice packs on the hemorrhoids if they are tender and swollen. Using ice packs between sitz baths may be helpful.   Put ice in a plastic bag.   Place a towel between your skin and the bag.   Leave the ice on for 15 20 minutes, 3 4 times a day.   Do not use a donut-shaped pillow or sit on the toilet for long periods. This increases blood pooling and pain.  SEEK MEDICAL CARE IF:  You have increasing pain and swelling that is not controlled by treatment or medicine.  You have uncontrolled bleeding.  You have difficulty or you are unable to have a bowel movement.  You have pain or inflammation outside the area of the hemorrhoids. MAKE SURE YOU:  Understand these instructions.  Will watch your condition.  Will get help right away if you are not doing well or get worse. Document Released: 05/28/2000 Document Revised: 05/17/2012 Document Reviewed: 04/04/2012 [facility] Patient Information 2014 [place].   Colon Polyps Polyps are lumps of extra tissue growing inside the body. Polyps can grow in the large intestine (colon). Most colon polyps are noncancerous  (benign). However, some colon polyps can become cancerous over time. Polyps that are larger than a pea may be harmful. To be safe, caregivers remove and test all polyps. CAUSES  Polyps form when mutations in the genes cause your cells to grow and divide even though no more tissue is needed. RISK FACTORS There are a number of risk factors that can increase your chances of getting colon polyps. They include:  Being older than 50 years.  Family history of colon polyps or colon cancer.  Long-term colon diseases, such as colitis or Crohn disease.  Being overweight.  Smoking.  Being inactive.  Drinking too much alcohol. SYMPTOMS  Most small polyps do not cause symptoms. If symptoms are present, they may include:  Blood in the stool. The stool may look dark red or black.  Constipation or diarrhea that lasts longer than 1 week. DIAGNOSIS People often do not know they have polyps until their caregiver finds them during a regular checkup. Your caregiver can use 4 tests to check for polyps:  Digital rectal exam. The caregiver wears gloves and feels inside the rectum. This test would find polyps only in the rectum.  Barium enema. The caregiver puts a liquid called barium into your rectum before taking X-rays of your colon. Barium makes your colon look Keyari Kleeman. Polyps are dark, so they are easy to see in the X-ray pictures.  Sigmoidoscopy. A thin, flexible tube (sigmoidoscope) is placed into your rectum. The sigmoidoscope has a light and tiny camera in it. The caregiver uses the sigmoidoscope to look at the last third of your colon.  Colonoscopy. This test is like sigmoidoscopy, but the caregiver looks at the entire colon. This is the most common method for finding and removing polyps. TREATMENT  Any polyps will be removed during a sigmoidoscopy or colonoscopy. The polyps are then tested for cancer. PREVENTION  To help lower your risk of getting more colon polyps:  Eat plenty of fruits and  vegetables. Avoid eating fatty foods.  Do not smoke.  Avoid drinking alcohol.  Exercise every day.  Lose weight if recommended by your caregiver.  Eat plenty of calcium and folate. Foods that are rich in calcium include milk, cheese, and broccoli. Foods that are rich in folate include chickpeas, kidney beans, and spinach. HOME CARE INSTRUCTIONS Keep all follow-up appointments as directed by your caregiver. You may need periodic exams to check for polyps. SEEK MEDICAL CARE IF: You notice bleeding during a bowel movement. Document Released: 02/25/2004 Document Revised: 08/23/2011 Document Reviewed: 08/10/2011 [facility] Patient Information 2014 [place], [place]

## 2013-07-31 NOTE — Op Note (Signed)
[facility] [address] [place], [address]   COLONOSCOPY PROCEDURE REPORT  PATIENT: [name], [name]  MR#:         [id number] BIRTHDATE: 10-02-1955 , 44  yrs. old GENDER: Male ENDOSCOPIST: R.  Garfield Cornea, MD FACP FACG REFERRED BY:  Sallee Lange, M.D. PROCEDURE DATE:  07/31/2013 PROCEDURE:      Ileocolonoscopy with segmental biopsy and snare polypectomy  INDICATIONS:  hematochezia; chronic diarrhea  INFORMED CONSENT:  The risks, benefits, alternatives and imponderables including but not limited to bleeding, perforation as well as the possibility of a missed lesion have been reviewed.  The potential for biopsy, lesion removal, etc. have also been discussed.  Questions have been answered.  All parties agreeable. Please see the history and physical in the medical record for more information.  MEDICATIONS:  Versed 5 mg IV and Demerol 75 mg IV in divided doses. Zofran 4 mg IV  DESCRIPTION OF PROCEDURE:  After a digital rectal exam was performed, the EC-[id number] ([id number]) and EG-2990i ([id number]) colonoscope was advanced from the anus through the rectum and colon to the area of the cecum, ileocecal valve and appendiceal orifice. The cecum was deeply intubated.  These structures were well-seen and photographed for the record.  From the level of the cecum and ileocecal valve, the scope was slowly and cautiously withdrawn. The mucosal surfaces were carefully surveyed utilizing scope tip deflection to facilitate fold flattening as needed.  The scope was pulled down into the rectum where a thorough examination including retroflexion was performed.    FINDINGS:   Redundant anal canal hemorrhoid tissue -grade 3. Adequate preparation. Normal rectum. Scattered left-sided diverticula; (1) 4 mm polyp in the mid ascending; otherwise, the remainder of the colonic mucosa appeared normal. The distal 5 cm of terminal ileal mucosa also appeared normal.  THERAPEUTIC /  DIAGNOSTIC MANEUVERS PERFORMED:   The above-mentioned polyp was cold snare removed. Segment biopsies of the ascending and descending segments taken to consider the possibility of microscopic colitis.  COMPLICATIONS:  None  CECAL WITHDRAWAL TIME:   11 minutes  IMPRESSION:   Redundant anal canal hemorrhoids-likely source of hematochezia. Colonic diverticulosis; single colonic polyp-removed as described above. status post segmental biopsy  RECOMMENDATIONS:    Followup on pathology. Patient will benefit from hemorrhoid banding. We'll schedule in the near future. See EGD report.   _______________________________ eSigned:  R. Garfield Cornea, MD FACP [facility] 07/31/2013 11:18 AM   CC:

## 2013-07-31 NOTE — Op Note (Signed)
[facility] [address] [place], [address]   ENDOSCOPY PROCEDURE REPORT  PATIENT: [name], [name]  MR#: [id number] BIRTHDATE: 1955/10/17 , 58  yrs. old GENDER: Male ENDOSCOPIST: R.  Garfield Cornea, MD FACP FACG REFERRED BY:  Sallee Lange, M.D. PROCEDURE DATE:  07/31/2013 PROCEDURE:      EGD with [name] dilation followed by duodenal and esophageal biopsy  INDICATIONS:        Recurrent esophageal dysphagia; history of Schatzki's ring.; Chronic diarrhea  INFORMED CONSENT:   The risks, benefits, limitations, alternatives and imponderables have been discussed.  The potential for biopsy, esophogeal dilation, etc. have also been reviewed.  Questions have been answered.  All parties agreeable.  Please see the history and physical in the medical record for more information.  MEDICATIONS:       Versed 5 mg IV and Demerol 75 mg IV in divided doses.   Xylocaine gel orally Zofran 4 mg IV  DESCRIPTION OF PROCEDURE:   The EG-2990i ([id number])  endoscope was introduced through the mouth and advanced to the second portion of the duodenum without difficulty or limitations.  The mucosal surfaces were surveyed very carefully during advancement of the scope and upon withdrawal.  Retroflexion view of the proximal stomach and esophagogastric junction was performed.      FINDINGS:     Patent, normal-appearing tubular esophagus. Stomach empty. Normal-appearing gastric mucosa. Patent pylorus. Normal appearing  first, second and third portion of the duodenum  THERAPEUTIC / DIAGNOSTIC MANEUVERS PERFORMED:   A 56 French Maloney dilator was passed to full insertion with mild to moderate resistance. A look back revealed no apparent complication related to this maneuver. Subsequently, biopsies of the duodenal and esophageal mucosa were taken.   COMPLICATIONS:  None  IMPRESSION:  Essentially normal EGD  -  status post passage of a Maloney dilator followed by duodenal and  esophageal biopsy  RECOMMENDATIONS:    Continue Protonix 40 mg daily. Follow up pathology. See colonoscopy report.    _______________________________ R. Garfield Cornea, MD FACP [facility] eSigned:  R. Garfield Cornea, MD FACP [facility] 07/31/2013 10:54 AM     CC:

## 2013-07-31 NOTE — Progress Notes (Signed)
Quick Note:  CBC reviewed, normal. Proceed with colonoscopy as planned. ______

## 2013-07-31 NOTE — H&P (View-Only) (Signed)
Referring Provider: [name], MD Primary Care Physician:  Sallee Lange, MD Primary GI: Dr. [name]   Chief Complaint  Patient presents with  . Rectal Bleeding    HPI:   [name] presents today with rectal bleeding. Has history of chronic diarrhea with flex sig in July 2012. Surveillance colonoscopy due in June 2016. Last full lower GI evaluation in Jan 2011. Remote history of rectal polyp with carcinoma in situ.  Also notable history of chronic abdominal pain, weight loss. Weight actually stable in the 170s. Last EGD in April 2014 with Schatzki's ring s/p dilation, small hiatal hernia, negative path.   Notes onset of rectal bleeding about 4-5 weeks ago. Sometimes burgundy color, sometimes bright red blood. Feels nauseated, difficulty eating since bleeding started. Poor appetite. Gets full off of small amounts of food.  Only drinking tea. States he has lost about 10 lbs. States he had been about 176. Today 172. Sometimes eating only one meal a day. BM every morning, sometimes 2-3 times per day. States usually diarrhea. States rectal bleeding was with every bowel movement at the beginning, but now it is tapering off. Hasn't seen anything in the last few days. Sometimes rectal burning. Denies any itching, obvious hemorrhoids.   States upper abdomen stays "tender". Protonix daily. Pepcid daily.    Past Medical History  Diagnosis Date  . ASCVD (arteriosclerotic cardiovascular disease)     inferior MI with circumflex PTCA in 1993;cath in 2000-50% OM 1&2,30% LAD ;2008-25% LAD, 80% nondominant right ,60% small OM 2,EF of 55% with severe basilar inferior hypokinesis; stress nuclear study in 04/2008 moderate inferior scar with peri-infarction ischemic  . Syncope   . Hyperlipidemia   . Hypertension   . Tobacco abuse     50 pack years continuing at one halp pack daily  . COPD (chronic obstructive pulmonary disease)     mild ;excercise induced hypoxemia by cp stress test ;asthma  ,bronchitis,  . Restless leg syndrome   . Anxiety and depression   . GERD (gastroesophageal reflux disease)   . Inflammatory polyps of colon with rectal bleeding   . Carcinoma in situ of colon 2004    rectal polyp  . H. pylori infection 2004    treated  . Sleep apnea     does not use CPAP  . Gastritis 12/30/10    EGD Dr [name]  . Colitis, ischemic 2011  . Tubular adenoma of colon 06/2009    Colonosocpy Dr [name]  . Leukocytosis     [name]  . Depression   . Anxiety   . Lung nodule 07/16/2011  . CAD (coronary artery disease)     Past Surgical History  Procedure Laterality Date  . Colonoscopy w/ polypectomy  2004    rectal polyp with carcinoma in situ removed via colonoscopy  . Hand surgery      surgical intervention for injury of the fingers of the left hand many years ago  . Colonoscopy  06/2009    normal terminal ileum, segmental mild inflammation of sigmoid colon (bx unremarkable), polyp, tubular adenoma  . Esophagogastroduodenoscopy  02/2009    query Barrett's but bx negative  . Esophagogastroduodenoscopy  12/30/2010    [name],gastritis, dilated 67F, sm HH, 1 small ulcer, Duodenal erosions, benign bx  . Flexible sigmoidoscopy  12/30/2010     [name],; internal hemorrhoids, anal papilla  . Heart stent    . Nasal septoplasty w/ turbinoplasty  10/06/2011    Procedure: NASAL  SEPTOPLASTY WITH TURBINATE REDUCTION;  Surgeon: Izora Gala, MD;  Location: [place];  Service: ENT;  Laterality: Bilateral;  . Cardiac catheterization    . Coronary angioplasty with stent placement  01/26/2012    "1; total is now 2"  . Esophagogastroduodenoscopy (egd) with esophageal dilation N/A 09/12/2012    [id number] Schatzki's ring s/p Maloney dilator. Small hiatal hernia. negative path  . Cholecystectomy  2004    Current Outpatient Prescriptions  Medication Sig Dispense Refill  . albuterol (PROVENTIL) (2.5 MG/3ML) 0.083% nebulizer solution Take 2.5 mg by nebulization every 6  (six) hours as needed. For shortness of breath      . albuterol-ipratropium (COMBIVENT) 18-103 MCG/ACT inhaler Inhale 2 puffs into the lungs every 6 (six) hours as needed. For shortness of breath      . ALPRAZolam (XANAX) 1 MG tablet Take 1 tablet (1 mg total) by mouth 3 (three) times daily.  90 tablet  2  . aspirin EC 81 MG tablet Take 81 mg by mouth daily.       . budesonide-formoterol (SYMBICORT) 80-4.5 MCG/ACT inhaler Inhale 2 puffs into the lungs 2 (two) times daily.        . famotidine (PEPCID) 20 MG tablet Take 1 tablet (20 mg total) by mouth daily.  30 tablet  3  . FLUoxetine (PROZAC) 40 MG capsule Take 1 capsule (40 mg total) by mouth daily.  30 capsule  2  . gabapentin (NEURONTIN) 300 MG capsule Take 2 capsules (600 mg total) by mouth 3 (three) times daily.  180 capsule  2  . HYDROcodone-acetaminophen (NORCO) 7.5-325 MG per tablet Take 1 tablet by mouth every 8 (eight) hours as needed.  90 tablet  0  . metoprolol succinate (TOPROL-XL) 25 MG 24 hr tablet TAKE 1 TABLET BY MOUTH EVERY DAY  30 tablet  6  . OLANZapine (ZYPREXA) 10 MG tablet Take 1 tablet (10 mg total) by mouth at bedtime.  30 tablet  2  . pantoprazole (PROTONIX) 40 MG tablet TAKE 1 TABLET BY MOUTH EVERY DAY  30 tablet  11  . pravastatin (PRAVACHOL) 40 MG tablet TAKE 1 TABLET BY MOUTH EVERY DAY  30 tablet  6  . promethazine (PHENERGAN) 25 MG suppository Place 25 mg rectally every 6 (six) hours as needed.      Marland Kitchen rOPINIRole (REQUIP) 0.5 MG tablet Take 0.5 mg by mouth 3 (three) times daily.      . verapamil (CALAN-SR) 180 MG CR tablet TAKE 1 TABLET EVERY DAY  30 tablet  10  . vitamin B-12 (CYANOCOBALAMIN) 1000 MCG tablet Take 2,000 mcg by mouth daily.      . nitroGLYCERIN (NITROSTAT) 0.4 MG SL tablet Place 1 tablet (0.4 mg total) under the tongue every 5 (five) minutes as needed for chest pain.  25 tablet  3  . ondansetron (ZOFRAN ODT) 4 MG disintegrating tablet Take 1 tablet (4 mg total) by mouth 3 (three) times daily with meals.   90 tablet  3  . [DISCONTINUED] amLODipine (NORVASC) 5 MG tablet Take 5 mg by mouth daily.        No current facility-administered medications for this visit.    Allergies as of 07/23/2013  . (No Known Allergies)    Family History  Problem Relation Age of Onset  . Lung disease Father     deceased, black lung  . Heart disease Mother     blood clots  . Depression Mother   . Cancer Paternal Uncle     unknown  type  . Colon cancer Neg Hx   . ADD / ADHD Neg Hx   . Alcohol abuse Neg Hx   . Drug abuse Neg Hx   . Anxiety disorder Neg Hx   . Bipolar disorder Neg Hx   . Dementia Neg Hx   . OCD Neg Hx   . Paranoid behavior Neg Hx   . Schizophrenia Neg Hx   . Physical abuse Neg Hx   . Sexual abuse Neg Hx   . Seizures Neg Hx   . Cancer Maternal Aunt     unknown type  . Kidney failure Maternal Uncle   . Hypertension Brother   . Colon cancer Neg Hx     History   Social History  . Marital Status: Married    Spouse Name: N/A    Number of Children: 3  . Years of Education: N/A   Occupational History  . disable     [employer]   Social History Main Topics  . Smoking status: Former Smoker -- 0.50 packs/day for 30 years    Types: Cigarettes  . Smokeless tobacco: Never Used     Comment: now using electronic cigarettes  . Alcohol Use: No     Comment: Drinks a beer occasionally  . Drug Use: No  . Sexual Activity: None   Other Topics Concern  . None   Social History Narrative   3 stepchildren    Review of Systems: Gen: feels fatigued CV: Denies chest pain, palpitations, syncope, peripheral edema, and claudication. Resp: +DOE GI: see HPI Derm: Denies rash, itching, dry skin Psych: +anxiety Heme: Denies bruising, bleeding, and enlarged lymph nodes.  Physical Exam: BP 120/77  Pulse 72  Temp(Src) 98.3 F (36.8 C) (Oral)  Ht 5\' 10"  (1.778 m)  Wt 172 lb 3.2 oz (78.109 kg)  BMI 24.71 kg/m2 General:   Alert and oriented. No distress noted. Pleasant and cooperative.  Head:   Normocephalic and atraumatic. Eyes:  Conjuctiva clear without scleral icterus. Mouth:  Oral mucosa pink and moist.  Heart:  S1, S2 present without murmurs, rubs, or gallops. Regular rate and rhythm. Abdomen:  +BS, soft, no TTP and non-distended. No rebound or guarding. No HSM or masses noted. Rectal: declined Msk:  Symmetrical without gross deformities. Normal posture. Extremities:  Without edema. Neurologic:  Alert and  oriented x4;  grossly normal neurologically. Skin:  Intact without significant lesions or rashes. Psych:  Alert and cooperative. Normal mood and affect.  CT abd/pelvis April 2014:  IMPRESSION:  1. No acute or focal abnormality to explain the patient's  symptoms.  2. New stable intrahepatic biliary dilation following  cholecystectomy.  3. Mild centrilobular emphysema.

## 2013-08-01 ENCOUNTER — Encounter: Payer: Self-pay | Admitting: Internal Medicine

## 2013-08-02 ENCOUNTER — Encounter (HOSPITAL_COMMUNITY): Payer: Self-pay | Admitting: Internal Medicine

## 2013-08-05 ENCOUNTER — Encounter: Payer: Self-pay | Admitting: Internal Medicine

## 2013-08-11 ENCOUNTER — Other Ambulatory Visit: Payer: Self-pay | Admitting: Cardiology

## 2013-08-11 ENCOUNTER — Other Ambulatory Visit: Payer: Self-pay | Admitting: Adult Health

## 2013-08-27 ENCOUNTER — Encounter (HOSPITAL_COMMUNITY): Payer: Self-pay | Admitting: Psychiatry

## 2013-08-27 ENCOUNTER — Ambulatory Visit (INDEPENDENT_AMBULATORY_CARE_PROVIDER_SITE_OTHER): Payer: BC Managed Care – PPO | Admitting: Psychiatry

## 2013-08-27 VITALS — BP 120/80 | Ht 70.0 in | Wt 174.0 lb

## 2013-08-27 DIAGNOSIS — F411 Generalized anxiety disorder: Secondary | ICD-10-CM

## 2013-08-27 DIAGNOSIS — F418 Other specified anxiety disorders: Secondary | ICD-10-CM

## 2013-08-27 DIAGNOSIS — F332 Major depressive disorder, recurrent severe without psychotic features: Secondary | ICD-10-CM

## 2013-08-27 DIAGNOSIS — F17200 Nicotine dependence, unspecified, uncomplicated: Secondary | ICD-10-CM

## 2013-08-27 DIAGNOSIS — M79609 Pain in unspecified limb: Secondary | ICD-10-CM

## 2013-08-27 DIAGNOSIS — F5105 Insomnia due to other mental disorder: Secondary | ICD-10-CM

## 2013-08-27 MED ORDER — GABAPENTIN 300 MG PO CAPS: 600.0000 mg | ORAL_CAPSULE | Freq: Three times a day (TID) | ORAL | Status: DC

## 2013-08-27 MED ORDER — FLUOXETINE HCL 40 MG PO CAPS: 40.0000 mg | ORAL_CAPSULE | Freq: Every day | ORAL | Status: DC

## 2013-08-27 MED ORDER — OLANZAPINE 10 MG PO TABS: 10.0000 mg | ORAL_TABLET | Freq: Every day | ORAL | Status: DC

## 2013-08-27 NOTE — Progress Notes (Signed)
Patient ID: [name], male   DOB: [birth year], 58 y.o.   MRN: [id number] Patient ID: [name], male   DOB: 1955-07-13, 58 y.o.   MRN: [id number] Patient ID: [name], male   DOB: 12/10/1955, 58 y.o.   MRN: [id number] Patient ID: [name], male   DOB: Sep 29, 1955, 58 y.o.   MRN: [id number] [place] [address] Progress Note [name] MRN: [id number] DOB: September 10, 1955 Age: 58 y.o.  Date: 08/27/2013 Start Time: 9:50 AM End Time: 10:05 AM  Chief Complaint: Chief Complaint  Patient presents with  . Anxiety  . Depression  . Follow-up    Subjective:  "I've been doing better  This patient is a 58 year old married white male lives with his wife in [place]. He is on disability for coronary artery disease and COPD. He used to work for the [employer] but has not worked for the last 6 years. He has 3 step children 1 adopted child and several grandchildren.  The patient states he's been depressed and anxious primarily since he stopped working 6 years ago. He's also been through several deaths in his family his brother died suddenly 4 years ago. Following that his sister-in-law died in his home 8 months after his brother died. The patient's wife was caring for his wife's mother and she died last month at the age of 12. All of these things are disturbing to him. He's had trouble sleeping because he is thinking about the deaths. He's more depressed but not suicidal. He's not eating well and has lost about 20 pounds. He also has restless legs and chronic pain in his legs which keep him awake at night. He enjoys hunting and fishing but is less these interests recently.  The patient returns after 2 months. Since I last saw him, he had a colonoscopy and some precancerous polyps were removed. He's no longer having rectal bleeding. His mood is improved since increased his Zyprexa. He still very worried about his daughter who is using drugs and stealing credit cards from his wife. She is still in  from stores and other places and is probably going to go to jail soon. He is hoping that he and his wife can gain custody of his daughter's little girl. .  Diagnosis:   Axis I: Anxiety Disorder NOS and Major Depression, Recurrent severe Axis II: Deferred Axis III:  Past Medical History  Diagnosis Date  . ASCVD (arteriosclerotic cardiovascular disease)     inferior MI with circumflex PTCA in 1993;cath in 2000-50% OM 1&2,30% LAD ;2008-25% LAD, 80% nondominant right ,60% small OM 2,EF of 55% with severe basilar inferior hypokinesis; stress nuclear study in 04/2008 moderate inferior scar with peri-infarction ischemic  . Syncope   . Hyperlipidemia   . Hypertension   . Tobacco abuse     50 pack years continuing at one halp pack daily  . COPD (chronic obstructive pulmonary disease)     mild ;excercise induced hypoxemia by cp stress test ;asthma ,bronchitis,  . Restless leg syndrome   . Anxiety and depression   . GERD (gastroesophageal reflux disease)   . Inflammatory polyps of colon with rectal bleeding   . Carcinoma in situ of colon 2004    rectal polyp  . H. pylori infection 2004    treated  . Sleep apnea     does not use CPAP  . Gastritis 12/30/10    EGD Dr [name]  . Colitis, ischemic 2011  . Tubular adenoma of colon 06/2009  Colonosocpy Dr [name]  . Leukocytosis     [name]  . Depression   . Anxiety   . Lung nodule 07/16/2011  . CAD (coronary artery disease)    Axis IV: other psychosocial or environmental problems Axis V: 41-50 serious symptoms  ADL's:  Intact  Sleep: Fair  Appetite:  Poor  Allergies: No Known Allergies Medical History: Past Medical History  Diagnosis Date  . ASCVD (arteriosclerotic cardiovascular disease)     inferior MI with circumflex PTCA in 1993;cath in 2000-50% OM 1&2,30% LAD ;2008-25% LAD, 80% nondominant right ,60% small OM 2,EF of 55% with severe basilar inferior hypokinesis; stress nuclear study in 04/2008 moderate inferior scar  with peri-infarction ischemic  . Syncope   . Hyperlipidemia   . Hypertension   . Tobacco abuse     50 pack years continuing at one halp pack daily  . COPD (chronic obstructive pulmonary disease)     mild ;excercise induced hypoxemia by cp stress test ;asthma ,bronchitis,  . Restless leg syndrome   . Anxiety and depression   . GERD (gastroesophageal reflux disease)   . Inflammatory polyps of colon with rectal bleeding   . Carcinoma in situ of colon 2004    rectal polyp  . H. pylori infection 2004    treated  . Sleep apnea     does not use CPAP  . Gastritis 12/30/10    EGD Dr [name]  . Colitis, ischemic 2011  . Tubular adenoma of colon 06/2009    Colonosocpy Dr [name]  . Leukocytosis     [name]  . Depression   . Anxiety   . Lung nodule 07/16/2011  . CAD (coronary artery disease)    Surgical History: Past Surgical History  Procedure Laterality Date  . Colonoscopy w/ polypectomy  2004    rectal polyp with carcinoma in situ removed via colonoscopy  . Hand surgery      surgical intervention for injury of the fingers of the left hand many years ago  . Colonoscopy  06/2009    normal terminal ileum, segmental mild inflammation of sigmoid colon (bx unremarkable), polyp, tubular adenoma  . Esophagogastroduodenoscopy  02/2009    query Barrett's but bx negative  . Esophagogastroduodenoscopy  12/30/2010    [name],gastritis, dilated 98F, sm HH, 1 small ulcer, Duodenal erosions, benign bx  . Flexible sigmoidoscopy  12/30/2010     [name],; internal hemorrhoids, anal papilla  . Heart stent    . Nasal septoplasty w/ turbinoplasty  10/06/2011    Procedure: NASAL SEPTOPLASTY WITH TURBINATE REDUCTION;  Surgeon: Izora Gala, MD;  Location: [place];  Service: ENT;  Laterality: Bilateral;  . Cardiac catheterization    . Coronary angioplasty with stent placement  01/26/2012    "1; total is now 2"  . Esophagogastroduodenoscopy (egd) with esophageal dilation N/A 09/12/2012     [id number] Schatzki's ring s/p Maloney dilator. Small hiatal hernia. negative path  . Cholecystectomy  2004  . Colonoscopy N/A 07/31/2013    Procedure: COLONOSCOPY;  Surgeon: [name], MD;  [facility];  Service: Endoscopy;  Laterality: N/A;  1:15  . Esophagogastroduodenoscopy (egd) with esophageal dilation N/A 07/31/2013    Procedure: ESOPHAGOGASTRODUODENOSCOPY (EGD) WITH ESOPHAGEAL DILATION;  Surgeon: [name], MD;  [facility];  Service: Endoscopy;  Laterality: N/A;   Family History: family history includes Cancer in his maternal aunt and paternal uncle; Depression in his mother; Heart disease in his mother; Hypertension in his brother;  Kidney failure in his maternal uncle; Lung disease in his father. There is no history of Colon cancer, ADD / ADHD, Alcohol abuse, Drug abuse, Anxiety disorder, Bipolar disorder, Dementia, OCD, Paranoid behavior, Schizophrenia, Physical abuse, Sexual abuse, Seizures, or Colon cancer. Reviewed and nothing new today.  Suicidal Ideation:  Pt has thoughts that are off and on, but not too bad lately Homicidal Ideation:  Pt has some struggles with his daughter's boy friend.  He stays away from him. AEB (as evidenced by):per pt report  Psychiatric Specialty Exam: ROS Neuro: no headaches, ataxia, weakness, gives out easily GI: no N/V/cramps/constipation, he keeps diarrhea for the past 6 or 7 years MS: no weakness or aches. He catches cramps really bad.   Blood pressure 120/80, height 5\' 10"  (1.778 m), weight 174 lb (78.926 kg).Body mass index is 24.97 kg/(m^2).  General Appearance: Casual  Eye Contact::  Fair  Speech:  Clear and Coherent  Volume:  Normal  Mood:  Anxious ,    Affect:  Congruent  Thought Process:  Coherent, Linear and Logical  Orientation:  Full (Time, Place, and Person)  Thought Content:  Within normal limits   Suicidal Thoughts:  no  Homicidal Thoughts:  No  Memory:  Immediate;   Fair Recent;    Fair Remote;   Fair  Judgement:  Fair  Insight:  Fair  Psychomotor Activity:  Normal  Concentration:  Fair  Recall:  Fair  Akathisia:  No  Handed:  Right  AIMS (if indicated):     Assets:  Communication Skills Desire for Improvement  Sleep:      Current Medications: Zyprexa 5 mg at bed time Xanax 1 mg TID Neurontin 600 mg 3 times a day Prozac 40 mg per day Lab Results: No results found for this or any previous visit (from the past 48 hour(s)). Cardiologist draws periodic labs.  He has not heard back on the last set.  They usually tell him if there are concerns.  Physical Findings: AIMS:  , ,  ,  ,    CIWA:    COWS:     Treatment Plan Summary: Medication management  Plan: I took his vitals.  I reviewed CC, tobacco/med/surg Hx, meds effects/ side effects, problem list, therapies and responses as well as current situation/symptoms discussed options. Continue current medicationsHe'll return to see me in 3 months but call if symptoms worsen before that See orders and pt instructions for more details.  MEDICATIONS this encounter: Meds ordered this encounter  Medications  . gabapentin (NEURONTIN) 300 MG capsule    Sig: Take 2 capsules (600 mg total) by mouth 3 (three) times daily.    Dispense:  180 capsule    Refill:  2  . OLANZapine (ZYPREXA) 10 MG tablet    Sig: Take 1 tablet (10 mg total) by mouth at bedtime.    Dispense:  30 tablet    Refill:  2  . FLUoxetine (PROZAC) 40 MG capsule    Sig: Take 1 capsule (40 mg total) by mouth daily.    Dispense:  30 capsule    Refill:  2    Medical Decision Making Problem Points:  Established problem, stable/improving (1), Review of last therapy session (1) and Review of psycho-social stressors (1) Data Points:  Review or order clinical lab tests (1) Review of medication regiment & side effects (2)  I certify that outpatient services furnished can reasonably be expected to improve the patient's condition.   ROSS,  [facility] 08/27/2013, 8:52 AM

## 2013-08-28 ENCOUNTER — Encounter: Payer: Self-pay | Admitting: Internal Medicine

## 2013-08-28 ENCOUNTER — Encounter (INDEPENDENT_AMBULATORY_CARE_PROVIDER_SITE_OTHER): Payer: Self-pay

## 2013-08-28 ENCOUNTER — Ambulatory Visit (INDEPENDENT_AMBULATORY_CARE_PROVIDER_SITE_OTHER): Payer: BC Managed Care – PPO | Admitting: Internal Medicine

## 2013-08-28 VITALS — BP 121/78 | HR 71 | Temp 97.6°F | Wt 173.8 lb

## 2013-08-28 DIAGNOSIS — K648 Other hemorrhoids: Secondary | ICD-10-CM

## 2013-08-28 NOTE — Progress Notes (Signed)
[place] banding procedure note:  See colonoscopy report dated 07/31/2013   The patient presents with symptomatic grade 3 hemorrhoids (bleeding, itching, burning), unresponsive to maximal medical therapy and requesting rubber band ligation of hisr hemorrhoidal disease. All risks, benefits, and alternative forms of therapy were described and informed consent was obtained.  In the left lateral decubitus position, DRE: performed. Followed by anoscopy. Patient was found to have a prominent right anterior hemorrhoid column.  The decision was made to band the right anterior internal hemorrhoid; the [place] was used to perform band ligation without complication. Digital anorectal examination was then performed to assure proper positioning of the band and to adjust the band tissue as required.  The band was found to be in excellent position. No pinching or pain. The patient was discharged home without pain or other issues. Dietary and behavioral recommendations were given.  Patient has IBS-D. Recommend one Imodium each morning when he gets out of bed and then one before meals as needed See discharge instructions. The patient tolerated the procedure well. No complications.

## 2013-08-28 NOTE — Patient Instructions (Signed)
Avoid straining.  Limit toilet time to 2-3 minutes  Take one Imodium tablet each morning when you get up and may take another Imodium before your main meals during the day  Call with any interim problems  Schedule followup appointment in 2-3 weeks from now

## 2013-09-07 ENCOUNTER — Encounter: Payer: Self-pay | Admitting: Adult Health

## 2013-09-07 ENCOUNTER — Ambulatory Visit (INDEPENDENT_AMBULATORY_CARE_PROVIDER_SITE_OTHER): Payer: BC Managed Care – PPO | Admitting: Adult Health

## 2013-09-07 VITALS — BP 128/83 | HR 64 | Ht 70.0 in | Wt 177.0 lb

## 2013-09-07 DIAGNOSIS — I2510 Atherosclerotic heart disease of native coronary artery without angina pectoris: Secondary | ICD-10-CM

## 2013-09-07 DIAGNOSIS — I10 Essential (primary) hypertension: Secondary | ICD-10-CM

## 2013-09-07 NOTE — Progress Notes (Deleted)
Name: [name]    DOB: 1955/09/13  Age: 58 y.o.  MR#: [id number]       PCP:  Sallee Lange, MD      Insurance: Payor: [employer]UE [place] / Plan: [place] PPO / Product Type: *No Product type* /   CC:    Chief Complaint  Patient presents with  . Coronary Artery Disease  . Nicotine Dependence    VS Filed Vitals:   09/07/13 1307  BP: 128/83  Pulse: 64  Height: 5\' 10"  (1.778 m)  Weight: 177 lb (80.287 kg)    Weights Current Weight  09/07/13 177 lb (80.287 kg)  08/28/13 173 lb 12.8 oz (78.835 kg)  08/27/13 174 lb (78.926 kg)    Blood Pressure  BP Readings from Last 3 Encounters:  09/07/13 128/83  08/28/13 121/78  08/27/13 120/80     Admit date:  (Not on file) Last encounter with RMR:  08/11/2013   Allergy Review of patient's allergies indicates no known allergies.  Current Outpatient Prescriptions  Medication Sig Dispense Refill  . albuterol (PROVENTIL) (2.5 MG/3ML) 0.083% nebulizer solution Take 2.5 mg by nebulization every 6 (six) hours as needed. For shortness of breath      . albuterol-ipratropium (COMBIVENT) 18-103 MCG/ACT inhaler Inhale 2 puffs into the lungs every 6 (six) hours as needed. For shortness of breath      . ALPRAZolam (XANAX) 1 MG tablet Take 1 tablet (1 mg total) by mouth 3 (three) times daily as needed for anxiety.  90 tablet  4  . aspirin EC 81 MG tablet Take 81 mg by mouth daily.       . budesonide-formoterol (SYMBICORT) 80-4.5 MCG/ACT inhaler Inhale 2 puffs into the lungs 2 (two) times daily.  1 Inhaler  12  . famotidine (PEPCID) 20 MG tablet Take 1 tablet (20 mg total) by mouth daily.  30 tablet  3  . FLUoxetine (PROZAC) 40 MG capsule Take 1 capsule (40 mg total) by mouth daily.  30 capsule  2  . gabapentin (NEURONTIN) 300 MG capsule Take 2 capsules (600 mg total) by mouth 3 (three) times daily.  180 capsule  2  . HYDROcodone-acetaminophen (NORCO) 7.5-325 MG per tablet Take 1 tablet by mouth 3 (three) times daily as needed for  moderate pain.  90 tablet  0  . hydrocortisone (ANUSOL-HC) 2.5 % rectal cream Place 1 application rectally 2 (two) times daily.  30 g  1  . metoprolol succinate (TOPROL-XL) 25 MG 24 hr tablet TAKE 1 TABLET BY MOUTH EVERY DAY  30 tablet  6  . nitroGLYCERIN (NITROSTAT) 0.4 MG SL tablet Place 0.4 mg under the tongue every 5 (five) minutes as needed for chest pain.      Marland Kitchen OLANZapine (ZYPREXA) 10 MG tablet Take 1 tablet (10 mg total) by mouth at bedtime.  30 tablet  2  . ondansetron (ZOFRAN ODT) 4 MG disintegrating tablet Take 1 tablet (4 mg total) by mouth 3 (three) times daily with meals.  90 tablet  3  . pantoprazole (PROTONIX) 40 MG tablet TAKE 1 TABLET BY MOUTH EVERY DAY  30 tablet  11  . polyethylene glycol-electrolytes (NULYTELY/GOLYTELY) 420 G solution       . pravastatin (PRAVACHOL) 40 MG tablet TAKE 1 TABLET BY MOUTH EVERY DAY  30 tablet  6  . rOPINIRole (REQUIP) 0.5 MG tablet Take 0.5 mg by mouth 3 (three) times daily.      . verapamil (CALAN-SR) 180 MG CR tablet TAKE 1  TABLET EVERY DAY  30 tablet  10  . vitamin B-12 (CYANOCOBALAMIN) 1000 MCG tablet Take 2,000 mcg by mouth daily.      . [DISCONTINUED] amLODipine (NORVASC) 5 MG tablet Take 5 mg by mouth daily.        No current facility-administered medications for this visit.    Discontinued Meds:   There are no discontinued medications.  Patient Active Problem List   Diagnosis Date Noted  . Rectal bleeding 07/23/2013  . Nausea alone 07/23/2013  . Hyperglycemia 04/25/2013  . 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 anxiety 09/20/2012  . Pain in limb 09/20/2012  . Loose stools 09/07/2012  . Dyspepsia 09/05/2012  . Dysphagia, unspecified 09/05/2012  . Gastroesophageal reflux disease 05/09/2012  . Lung nodule 07/16/2011  . RLS (restless legs syndrome) 03/09/2011  . Leukocytosis 12/21/2010  . TOBACCO ABUSE 09/23/2009  . ATHEROSCLEROTIC CARDIOVASCULAR DISEASE 09/23/2009  . Hyperlipidemia 10/01/2008  . HYPERTENSION 10/01/2008  .  Depression with anxiety 03/31/2007  . OBSTRUCTIVE SLEEP APNEA 03/31/2007  . Chronic obstructive pulmonary disease 03/31/2007    LABS    Component Value Date/Time   NA 139 04/06/2013 1048   NA 142 01/09/2013 1447   NA 140 12/27/2012 0944   K 4.0 04/06/2013 1048   K 3.9 01/09/2013 1447   K 4.3 12/27/2012 0944   CL 102 04/06/2013 1048   CL 106 01/09/2013 1447   CL 105 12/27/2012 0944   CO2 29 04/06/2013 1048   CO2 27 01/09/2013 1447   CO2 28 12/27/2012 0944   GLUCOSE 142* 04/06/2013 1048   GLUCOSE 90 01/09/2013 1447   GLUCOSE 145* 12/27/2012 0944   BUN 8 04/06/2013 1048   BUN 8 01/09/2013 1447   BUN 9 12/27/2012 0944   CREATININE 0.80 04/06/2013 1048   CREATININE 0.82 01/09/2013 1447   CREATININE 0.80 12/27/2012 0944   CREATININE 0.78 10/16/2012 1043   CREATININE 0.84 08/03/2012 1118   CREATININE 0.83 08/31/2011 1423   CALCIUM 9.2 04/06/2013 1048   CALCIUM 9.2 01/09/2013 1447   CALCIUM 9.2 12/27/2012 0944   GFRNONAA >90 04/06/2013 1048   GFRNONAA >90 12/27/2012 0944   GFRNONAA >90 10/16/2012 1043   GFRAA >90 04/06/2013 1048   GFRAA >90 12/27/2012 0944   GFRAA >90 10/16/2012 1043   CMP     Component Value Date/Time   NA 139 04/06/2013 1048   K 4.0 04/06/2013 1048   CL 102 04/06/2013 1048   CO2 29 04/06/2013 1048   GLUCOSE 142* 04/06/2013 1048   BUN 8 04/06/2013 1048   CREATININE 0.80 04/06/2013 1048   CREATININE 0.82 01/09/2013 1447   CALCIUM 9.2 04/06/2013 1048   PROT 6.6 04/30/2013 0751   ALBUMIN 4.1 04/30/2013 0751   AST 16 04/30/2013 0751   ALT 23 04/30/2013 0751   ALKPHOS 73 04/30/2013 0751   BILITOT 0.7 04/30/2013 0751   GFRNONAA >90 04/06/2013 1048   GFRAA >90 04/06/2013 1048       Component Value Date/Time   WBC 8.4 07/23/2013 1515   WBC 8.0 04/06/2013 1048   WBC 7.6 10/16/2012 1043   HGB 15.9 07/23/2013 1515   HGB 16.7 04/06/2013 1048   HGB 16.7 10/16/2012 1043   HCT 44.8 07/23/2013 1515   HCT 45.5 04/06/2013 1048   HCT 45.1 10/16/2012 1043   MCV 88.7 07/23/2013 1515   MCV  89.0 04/06/2013 1048   MCV 87.2 10/16/2012 1043    Lipid Panel     Component Value Date/Time   CHOL 196 04/30/2013 0751  TRIG 333* 04/30/2013 0751   HDL 31* 04/30/2013 0751   CHOLHDL 6.3 04/30/2013 0751   VLDL 67* 04/30/2013 0751   LDLCALC 98 04/30/2013 0751    ABG    Component Value Date/Time   PHART 7.385 05/17/2012 0930   PCO2ART 38.9 05/17/2012 0930   PO2ART 84.8 05/17/2012 0930   HCO3 22.8 05/17/2012 0930   TCO2 19.3 05/17/2012 0930   ACIDBASEDEF 1.5 05/17/2012 0930   O2SAT 97.4 05/17/2012 0930     Lab Results  Component Value Date   TSH 1.011 02/09/2010   BNP (last 3 results) No results found for this basename: PROBNP,  in the last 8760 hours Cardiac Panel (last 3 results) No results found for this basename: CKTOTAL, CKMB, TROPONINI, RELINDX,  in the last 72 hours  Iron/TIBC/Ferritin No results found for this basename: iron, tibc, ferritin     EKG Orders placed in visit on 01/31/13  . EKG 12-LEAD     Prior Assessment and Plan Problem List as of 09/07/2013     Cardiovascular and Mediastinum   HYPERTENSION   Last Assessment & Plan   01/31/2013 Office Visit Written 01/31/2013  1:18 PM by Lendon Colonel, NP     Good control of BP at this time. Continue current medications.    ATHEROSCLEROTIC CARDIOVASCULAR DISEASE   Last Assessment & Plan   01/31/2013 Office Visit Written 01/31/2013  1:14 PM by Lendon Colonel, NP     He is without cardiac complaint. No chest pain, or DOE. He has ongoing fatigue. Risk modification and ongoing management will continue.      Respiratory   OBSTRUCTIVE SLEEP APNEA   Last Assessment & Plan   01/31/2013 Office Visit Written 01/31/2013  1:16 PM by Lendon Colonel, NP     Does not use CPAP. This may be contributing to his fatigue.    Chronic obstructive pulmonary disease   Last Assessment & Plan   05/09/2012 Office Visit Written 05/09/2012  9:29 PM by Yehuda Savannah, MD     The patient has not received specialist care for  some years nor has he undergone any specific testing.  Pulmonary function tests will be obtained.      Digestive   Gastroesophageal reflux disease   Last Assessment & Plan   08/03/2012 Office Visit Written 08/03/2012 11:27 AM by Lendon Colonel, NP     He has been having a lot of diarrhea on prilosec. I will change this to Pepcid 20 mg daily, check Mg+ level. He is to follow up with Dr.Rehman for further assessment, As he is also on two statins, stopping one may also help with symptoms,.    Dysphagia, unspecified   Last Assessment & Plan   09/05/2012 Office Visit Written 09/07/2012  4:26 PM by Orvil Feil, NP     EGD/ED as planned.     Rectal bleeding   Last Assessment & Plan   07/23/2013 Office Visit Edited 07/23/2013  4:52 PM by Orvil Feil, NP     58 year old male with several week history of rectal bleeding, now tapering off in frequency, with associated nausea reported. Unclear if the two entities are related; however, last full lower GI evaluation in Jan 2011. He has a remote history of rectal polyp with carcinoma in situ. Declined rectal exam today. Warrants early interval colonoscopy at this point.   Proceed with TCS with Dr. [name] in near future: the risks, benefits, and alternatives have been discussed with the patient in  detail. The patient states understanding and desires to proceed. Phenergan 25 mg IV on call due to polypharmacy Anusol cream provided      Other   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 anxiety   Hyperlipidemia   Last Assessment & Plan   08/03/2012 Office Visit Written 08/03/2012 11:25 AM by Lendon Colonel, NP     It is noted that he is on both pravachol and atorvastatin. He is having some mild myalgia. I will stop the lipitor and continue pravachol. Check lipids and LFT,s and BMET.     TOBACCO ABUSE   Last Assessment & Plan   01/31/2013 Office Visit Written 01/31/2013  1:16 PM by Lendon Colonel, NP     He unfortunately continues to smoke. I have discussed  need for him to stop with CVRF and CAD.    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 GI workup as outlined above.    RLS (restless legs syndrome)   Last Assessment & Plan   03/09/2011 Office Visit Written 03/09/2011  5:28 PM by Kathee Delton, MD     The pt has a history that is suggestive of RLS, and I think we should give him a trial of a dopamine agonist.  He will give me feedback in a few weeks with his response.     Lung nodule   Last Assessment & Plan   08/31/2011 Office Visit Written 08/31/2011  2:17 PM by Yehuda Savannah, MD     03/2011-small nodule in the left lower lobe, 7.5 x 6.6 mm.  Previously 5.4 x 4.5 mm.   Pulmonary neoplasm cannot be excluded.  Bullous emphysema.; continued CT surveillance     Dyspepsia   Last Assessment & Plan   09/05/2012 Office Visit Edited 09/07/2012  4:28 PM by Orvil Feil, NP     58 year old male with history of chronic GERD, prior fluctuations in weight but only down 3 lbs from last visit, now with lack of appetite, early satiety, eating only one meal a day. States "goes through me like water. History of chronic loose stools with flex sig in 2012 unimpressive. Due for total colonoscopy June 2016. Intermittent dysphagia noted, with last dilation in 2012 at time of EGD: noted gastritis, negative biopsy. No melena noted. Unclear etiology at this point; needs EGD with dilation in near future due to dyspepsia and dysphagia. Would consider CT scan to assess for occult malignancy if EGD negative.  Proceed with upper endoscopy/dilation in the near future with Dr. [name]. The risks, benefits, and alternatives have been discussed in detail with patient. They have stated understanding and desire to proceed.  Phenergan 25 mg IV on call due to polypharmacy Avoid Prilosec, which patient had previously been on. Trial Protonix.      Loose stools   Last Assessment & Plan   01/31/2013 Office Visit Written 01/31/2013  1:18 PM by Lendon Colonel, NP     He is complaining of bloody stools, almost every other day. He has chronic diarrhea. He plans to see GI soon,  I have advised him to go to ER if he continues to have bleeding or significant melena for admission.    Pain in limb   Hyperglycemia   Nausea alone   Last Assessment & Plan   07/23/2013 Office Visit Written 07/23/2013  4:51  PM by Orvil Feil, NP     Unclear etiology. EGD on file from last year; patient with early satiety, poor appetite. Nausea onset around time of rectal bleeding. Question if coincidental or truly related. Colonoscopy as planned; if no significant findings, consider GES. Zofran provided for supportive measures.         Imaging: No results found.

## 2013-09-07 NOTE — Assessment & Plan Note (Signed)
Excellent control of BP at this time. He will continue on current medications as directed. See him in 6 months to be established with a cardiologist in the practice.

## 2013-09-07 NOTE — Patient Instructions (Signed)
Your physician recommends that you schedule a follow-up appointment in: 6 months with Dr Bryna Colander will receive a reminder letter two months in advance reminding you to call and schedule your appointment. If you don't receive this letter, please contact our office.  Your physician recommends that you continue on your current medications as directed. Please refer to the Current Medication list given to you today.

## 2013-09-07 NOTE — Progress Notes (Signed)
HPI: [name] is a 58 year old patient to be est. with Dr. Bronson Ing, formally a patient of Dr. Lattie Haw we follow for ongoing assessment and management of CAD, PCI to the RCA, using a bare-metal stent in 2012, with other history to include hypercholesterolemia, COPD, and ongoing tobacco abuse.  The patient was last seen in the office on 01/31/2013. He was without cardiac complaints at that time. He did however complain of fatigue with stools described as bright red. He was to followup with Dr. Sydell Axon for GI evaluation.  No cardiac complaints. He has stopped smoking for the last 4 months. Uses Electronic Cigarette. He has improvement in breathing status. He is medically compliant.    No Known Allergies  Current Outpatient Prescriptions  Medication Sig Dispense Refill  . albuterol (PROVENTIL) (2.5 MG/3ML) 0.083% nebulizer solution Take 2.5 mg by nebulization every 6 (six) hours as needed. For shortness of breath      . albuterol-ipratropium (COMBIVENT) 18-103 MCG/ACT inhaler Inhale 2 puffs into the lungs every 6 (six) hours as needed. For shortness of breath      . ALPRAZolam (XANAX) 1 MG tablet Take 1 tablet (1 mg total) by mouth 3 (three) times daily as needed for anxiety.  90 tablet  4  . aspirin EC 81 MG tablet Take 81 mg by mouth daily.       . budesonide-formoterol (SYMBICORT) 80-4.5 MCG/ACT inhaler Inhale 2 puffs into the lungs 2 (two) times daily.  1 Inhaler  12  . famotidine (PEPCID) 20 MG tablet Take 1 tablet (20 mg total) by mouth daily.  30 tablet  3  . FLUoxetine (PROZAC) 40 MG capsule Take 1 capsule (40 mg total) by mouth daily.  30 capsule  2  . gabapentin (NEURONTIN) 300 MG capsule Take 2 capsules (600 mg total) by mouth 3 (three) times daily.  180 capsule  2  . HYDROcodone-acetaminophen (NORCO) 7.5-325 MG per tablet Take 1 tablet by mouth 3 (three) times daily as needed for moderate pain.  90 tablet  0  . hydrocortisone (ANUSOL-HC) 2.5 % rectal cream Place 1 application  rectally 2 (two) times daily.  30 g  1  . metoprolol succinate (TOPROL-XL) 25 MG 24 hr tablet TAKE 1 TABLET BY MOUTH EVERY DAY  30 tablet  6  . nitroGLYCERIN (NITROSTAT) 0.4 MG SL tablet Place 0.4 mg under the tongue every 5 (five) minutes as needed for chest pain.      Marland Kitchen OLANZapine (ZYPREXA) 10 MG tablet Take 1 tablet (10 mg total) by mouth at bedtime.  30 tablet  2  . ondansetron (ZOFRAN ODT) 4 MG disintegrating tablet Take 1 tablet (4 mg total) by mouth 3 (three) times daily with meals.  90 tablet  3  . pantoprazole (PROTONIX) 40 MG tablet TAKE 1 TABLET BY MOUTH EVERY DAY  30 tablet  11  . polyethylene glycol-electrolytes (NULYTELY/GOLYTELY) 420 G solution       . pravastatin (PRAVACHOL) 40 MG tablet TAKE 1 TABLET BY MOUTH EVERY DAY  30 tablet  6  . rOPINIRole (REQUIP) 0.5 MG tablet Take 0.5 mg by mouth 3 (three) times daily.      . verapamil (CALAN-SR) 180 MG CR tablet TAKE 1 TABLET EVERY DAY  30 tablet  10  . vitamin B-12 (CYANOCOBALAMIN) 1000 MCG tablet Take 2,000 mcg by mouth daily.      . [DISCONTINUED] amLODipine (NORVASC) 5 MG tablet Take 5 mg by mouth daily.        No current  facility-administered medications for this visit.    Past Medical History  Diagnosis Date  . ASCVD (arteriosclerotic cardiovascular disease)     inferior MI with circumflex PTCA in 1993;cath in 2000-50% OM 1&2,30% LAD ;2008-25% LAD, 80% nondominant right ,60% small OM 2,EF of 55% with severe basilar inferior hypokinesis; stress nuclear study in 04/2008 moderate inferior scar with peri-infarction ischemic  . Syncope   . Hyperlipidemia   . Hypertension   . Tobacco abuse     50 pack years continuing at one halp pack daily  . COPD (chronic obstructive pulmonary disease)     mild ;excercise induced hypoxemia by cp stress test ;asthma ,bronchitis,  . Restless leg syndrome   . Anxiety and depression   . GERD (gastroesophageal reflux disease)   . Inflammatory polyps of colon with rectal bleeding   . Carcinoma  in situ of colon 2004    rectal polyp  . H. pylori infection 2004    treated  . Sleep apnea     does not use CPAP  . Gastritis 12/30/10    EGD Dr [name]  . Colitis, ischemic 2011  . Tubular adenoma of colon 06/2009    Colonosocpy Dr [name]  . Leukocytosis     [name]  . Depression   . Anxiety   . Lung nodule 07/16/2011  . CAD (coronary artery disease)   . Diverticulosis   . Hemorrhoids     Past Surgical History  Procedure Laterality Date  . Colonoscopy w/ polypectomy  2004    rectal polyp with carcinoma in situ removed via colonoscopy  . Hand surgery      surgical intervention for injury of the fingers of the left hand many years ago  . Colonoscopy  06/2009    normal terminal ileum, segmental mild inflammation of sigmoid colon (bx unremarkable), polyp, tubular adenoma  . Esophagogastroduodenoscopy  02/2009    query Barrett's but bx negative  . Esophagogastroduodenoscopy  12/30/2010    [name],gastritis, dilated 109F, sm HH, 1 small ulcer, Duodenal erosions, benign bx  . Flexible sigmoidoscopy  12/30/2010     [name],; internal hemorrhoids, anal papilla  . Heart stent    . Nasal septoplasty w/ turbinoplasty  10/06/2011    Procedure: NASAL SEPTOPLASTY WITH TURBINATE REDUCTION;  Surgeon: Izora Gala, MD;  Location: [facility];  Service: ENT;  Laterality: Bilateral;  . Cardiac catheterization    . Coronary angioplasty with stent placement  01/26/2012    "1; total is now 2"  . Esophagogastroduodenoscopy (egd) with esophageal dilation N/A 09/12/2012    ONG:[id number] Schatzki's ring s/p Maloney dilator. Small hiatal hernia. negative path  . Cholecystectomy  2004  . Colonoscopy N/A 07/31/2013    Dr.Rourk- redundant anal canal hemorrhoids, colonic diverticulosis, tubular adenoma  . Esophagogastroduodenoscopy (egd) with esophageal dilation N/A 07/31/2013    Dr. [name]- normal egd    WNU:UVOZDG of systems complete and found to be negative unless listed above  PHYSICAL  EXAM BP 128/83  Pulse 64  Ht 5\' 10"  (1.778 m)  Wt 177 lb (80.287 kg)  BMI 25.40 kg/m2  General: Well developed, well nourished, in no acute distress Head: Eyes PERRLA, No xanthomas.   Normal cephalic and atramatic  Lungs: Clear bilaterally to auscultation and percussion. Heart: HRRR S1 S2, without MRG.  Pulses are 2+ & equal.            No carotid bruit. No JVD.  No abdominal bruits. No femoral bruits. Abdomen: Bowel sounds are positive,  abdomen soft and non-tender without masses or                  Hernia's noted. Msk:  Back normal, normal gait. Normal strength and tone for age. Extremities: No clubbing, cyanosis or edema.  DP +1 Neuro: Alert and oriented X 3. Psych:  Good affect, responds appropriately  :  ASSESSMENT AND PLAN

## 2013-09-07 NOTE — Assessment & Plan Note (Signed)
He is without cardiac complaint. He is medically compliant. Labs are completed by Dr [name] for ongoing management of his lipid status. We will see him in 6 months unless symptomatic., He is congratulated on smoking cessation.

## 2013-10-09 ENCOUNTER — Encounter (INDEPENDENT_AMBULATORY_CARE_PROVIDER_SITE_OTHER): Payer: Self-pay

## 2013-10-09 ENCOUNTER — Ambulatory Visit (INDEPENDENT_AMBULATORY_CARE_PROVIDER_SITE_OTHER): Payer: BC Managed Care – PPO | Admitting: Internal Medicine

## 2013-10-09 ENCOUNTER — Encounter: Payer: Self-pay | Admitting: Internal Medicine

## 2013-10-09 VITALS — BP 146/83 | HR 60 | Temp 97.6°F | Ht 70.0 in | Wt 181.4 lb

## 2013-10-09 DIAGNOSIS — K648 Other hemorrhoids: Secondary | ICD-10-CM

## 2013-10-09 NOTE — Progress Notes (Signed)
[place] banding procedure note:  The patient presents with symptomatic hemorrhoids; status post banding of right anterior hemorrhoid a few weeks ago. Bleeding itching and swelling have improved. Patient is desirous of having another band placed. In the left lateral decubitus position, digital rectal exam performed. No abnormalities appreciated.  The decision was made to band the left lateral internal hemorrhoid;  The [place] was used to perform band ligation without complication. Digital anorectal examination was then performed to assure proper positioning of the band;  Bandstand been excellent position. No tension or pain after placement.The patient was discharged home without pain or other issues. Dietary and behavioral recommendations were given. The patient will return in 2-3 weeks for followup and possible additional banding as required.  No complications were encountered and the patient tolerated the procedure well.

## 2013-10-09 NOTE — Patient Instructions (Signed)
Avoid straining.  Benefiber 2 teaspoons twice daily  Limit toilet time to 2-3 minutes  Call with any interim problems  Schedule followup appointment in 2-3 weeks from now   

## 2013-10-23 ENCOUNTER — Ambulatory Visit (INDEPENDENT_AMBULATORY_CARE_PROVIDER_SITE_OTHER): Payer: BC Managed Care – PPO | Admitting: Family Medicine

## 2013-10-23 ENCOUNTER — Encounter: Payer: Self-pay | Admitting: Family Medicine

## 2013-10-23 VITALS — BP 130/86 | Ht 70.0 in | Wt 179.0 lb

## 2013-10-23 DIAGNOSIS — J449 Chronic obstructive pulmonary disease, unspecified: Secondary | ICD-10-CM

## 2013-10-23 DIAGNOSIS — Z23 Encounter for immunization: Secondary | ICD-10-CM

## 2013-10-23 DIAGNOSIS — Z79899 Other long term (current) drug therapy: Secondary | ICD-10-CM

## 2013-10-23 DIAGNOSIS — G894 Chronic pain syndrome: Secondary | ICD-10-CM

## 2013-10-23 DIAGNOSIS — I10 Essential (primary) hypertension: Secondary | ICD-10-CM

## 2013-10-23 DIAGNOSIS — R7309 Other abnormal glucose: Secondary | ICD-10-CM

## 2013-10-23 DIAGNOSIS — R739 Hyperglycemia, unspecified: Secondary | ICD-10-CM

## 2013-10-23 DIAGNOSIS — E785 Hyperlipidemia, unspecified: Secondary | ICD-10-CM

## 2013-10-23 MED ORDER — HYDROCODONE-ACETAMINOPHEN 7.5-325 MG PO TABS: 1.0000 | ORAL_TABLET | Freq: Three times a day (TID) | ORAL | Status: DC | PRN

## 2013-10-23 MED ORDER — ALPRAZOLAM 1 MG PO TABS: 1.0000 mg | ORAL_TABLET | Freq: Three times a day (TID) | ORAL | Status: DC | PRN

## 2013-10-23 NOTE — Progress Notes (Signed)
Subjective:    Patient ID: [name], male    DOB: 1955-07-12, 58 y.o.   MRN: [id number]  Hypertension This is a chronic problem. The current episode started more than 1 year ago. The problem has been gradually improving since onset. The problem is controlled. There are no associated agents to hypertension. There are no known risk factors for coronary artery disease. Treatments tried: metoprolol. The current treatment provides significant improvement. There are no compliance problems.   Patient states he has no concerns at this time.   This patient was seen today for chronic pain  The medication list was reviewed and updated.  Discussion was held with the patient regarding compliance with pain medication. The patient was advised the importance of maintaining medication and not using illegal substances with these. The patient was educated that we can provide 3 monthly scripts for their medication, it is their responsibility to follow the instructions. Discussion was held with the patient to make sure they're not having significant side effects. Patient is aware that pain medications are meant to minimize the severity of the pain to allow their pain levels to improve to allow for better function. They are aware of that pain medications cannot totally remove their pain.   Patient unable to quit smoking at this point. He knows he needs to do so. Patient does try to watch his diet. He relates he is responsibly taken his medications. Has history of prediabetes.   Review of Systems Denies chest tightness pressure pain denies bloody stools denies nausea vomiting diarrhea. Relates back pain knee pain hand pain appetite good denies being depressed currently anxious. He follows through with the specialist.    Objective:   Physical Exam  Vitals reviewed. Constitutional: He appears well-nourished. No distress.  Cardiovascular: Normal rate, regular rhythm and normal heart sounds.   No murmur  heard. Pulmonary/Chest: Effort normal and breath sounds normal. No respiratory distress.  Musculoskeletal: He exhibits no edema.  Lymphadenopathy:    He has no cervical adenopathy.  Neurological: He is alert.  Psychiatric: His behavior is normal.          Assessment & Plan:  1. HYPERTENSION Blood pressure looks good on recheck. He was encouraged to quit smoking he's currently using vaporized smoke - Hepatic function panel - Basic metabolic panel  2. Chronic obstructive pulmonary disease Patient was encouraged to quit smoking altogether if possible he had a CT scan last fall it was stable no need for repeat. He is using his inhalers as directed states he is getting pretty good results with that. Patient still gets short of breath with activity unable to walk long distances at all. Not able to tolerate being outside in the heat.  3. Hyperlipidemia Patient tries keep his cholesterol under good control by watching cats in his diet he is taking medication we will order lipid panel - Lipid panel  4. Chronic pain syndrome Patient has chronic pain in his lower back his knees and his hands he has take his pain medicine several times a day. He is unable to do any type of significant lifting pushing pulling. Unable to do any climbing. Gripping is difficult. Patient is disabled.  5. Hyperglycemia Patient has a history of hyperglycemia in the past his A1c is look good but because of some elevation of A1c I would recommend repeating this test - Hemoglobin A1c  6. Need for prophylactic vaccination with combined diphtheria-tetanus-pertussis (DTP) vaccine Tetanus shot today - Td vaccine greater than or equal  to 7yo preservative free IM  Followup in 3 months

## 2013-10-30 ENCOUNTER — Encounter: Payer: Self-pay | Admitting: Internal Medicine

## 2013-10-30 ENCOUNTER — Ambulatory Visit (INDEPENDENT_AMBULATORY_CARE_PROVIDER_SITE_OTHER): Payer: BC Managed Care – PPO | Admitting: Internal Medicine

## 2013-10-30 ENCOUNTER — Encounter (INDEPENDENT_AMBULATORY_CARE_PROVIDER_SITE_OTHER): Payer: Self-pay

## 2013-10-30 VITALS — BP 130/83 | HR 67 | Temp 97.2°F | Ht 70.0 in | Wt 175.0 lb

## 2013-10-30 DIAGNOSIS — K648 Other hemorrhoids: Secondary | ICD-10-CM

## 2013-10-30 LAB — LIPID PANEL
CHOLESTEROL: 197 mg/dL (ref 0–200)
HDL: 35 mg/dL — AB (ref >39)
LDL Cholesterol: 131 mg/dL — ABNORMAL HIGH (ref 0–99)
Total CHOL/HDL Ratio: 5.6 Ratio
Triglycerides: 156 mg/dL — ABNORMAL HIGH (ref <150)
VLDL: 31 mg/dL (ref 0–40)

## 2013-10-30 LAB — BASIC METABOLIC PANEL
BUN: 12 mg/dL (ref 6–23)
CHLORIDE: 104 meq/L (ref 96–112)
CO2: 29 mEq/L (ref 19–32)
Calcium: 9.4 mg/dL (ref 8.4–10.5)
Creat: 0.89 mg/dL (ref 0.50–1.35)
GLUCOSE: 95 mg/dL (ref 70–99)
Potassium: 4.2 mEq/L (ref 3.5–5.3)
Sodium: 138 mEq/L (ref 135–145)

## 2013-10-30 LAB — HEPATIC FUNCTION PANEL
ALBUMIN: 4.4 g/dL (ref 3.5–5.2)
ALT: 30 U/L (ref 0–53)
AST: 21 U/L (ref 0–37)
Alkaline Phosphatase: 72 U/L (ref 39–117)
Bilirubin, Direct: 0.2 mg/dL (ref 0.0–0.3)
Indirect Bilirubin: 0.7 mg/dL (ref 0.2–1.2)
Total Bilirubin: 0.9 mg/dL (ref 0.2–1.2)
Total Protein: 6.9 g/dL (ref 6.0–8.3)

## 2013-10-30 LAB — HEMOGLOBIN A1C
Hgb A1c MFr Bld: 5.2 % (ref <5.7)
Mean Plasma Glucose: 103 mg/dL (ref <117)

## 2013-10-30 NOTE — Progress Notes (Signed)
[place] banding procedure note:  The patient presents with symptomatic grade 3 hemorrhoids; status post banding of right anterior and left lateral hemorrhoid column. Bleeding has improved occasional itching. Less swelling. Still feels a protuberant hemorrhoid from time to time. Admits he does not take fiber on a regular basis. He wants to have the third band placed today.All risks, benefits, and alternative forms of therapy were described and informed consent was obtained.  In the left lateral decubitus position, a digital rectal exam was performed. Was normal aside from a small grade 3 hemorrhoid tag. Easily reducible.  The decision was made place a third man; the [place] was used to perform band ligation without complication. The system was placed in the anal canal in neutral position. Band deployed. Digital anorectal examination was then performed to assure proper positioning; banded a slower position on the right side. No pinching or pain. The patient was discharged home without pain or other issues. Dietary and behavioral recommendations were given.The patient will return in 3 months for followup.     No complications were encountered and the patient tolerated the procedure well.

## 2013-10-30 NOTE — Patient Instructions (Addendum)
Avoid straining.  Benefiber 2 teaspoons twice daily (need to take every day)  Limit toilet time to 2-3 minutes  Call with any interim problems  Schedule followup appointment 3 months from now

## 2013-11-06 MED ORDER — ATORVASTATIN CALCIUM 40 MG PO TABS: 40.0000 mg | ORAL_TABLET | Freq: Every day | ORAL | Status: DC

## 2013-11-06 NOTE — Progress Notes (Signed)
Patient's wife notified and verbalized understanding.  

## 2013-11-06 NOTE — Addendum Note (Signed)
Addended byCharolotte Capuchin D on: 11/06/2013 01:50 PM   Modules accepted: Orders, Medications

## 2013-11-12 ENCOUNTER — Telehealth (HOSPITAL_COMMUNITY): Payer: Self-pay | Admitting: *Deleted

## 2013-11-14 ENCOUNTER — Other Ambulatory Visit: Payer: Self-pay | Admitting: Adult Health

## 2013-11-19 ENCOUNTER — Telehealth: Payer: Self-pay | Admitting: *Deleted

## 2013-11-19 ENCOUNTER — Ambulatory Visit (INDEPENDENT_AMBULATORY_CARE_PROVIDER_SITE_OTHER): Payer: BC Managed Care – PPO | Admitting: Family Medicine

## 2013-11-19 ENCOUNTER — Encounter: Payer: Self-pay | Admitting: Family Medicine

## 2013-11-19 VITALS — BP 110/80 | Temp 97.6°F | Ht 70.0 in | Wt 169.2 lb

## 2013-11-19 DIAGNOSIS — R110 Nausea: Secondary | ICD-10-CM

## 2013-11-19 DIAGNOSIS — R634 Abnormal weight loss: Secondary | ICD-10-CM

## 2013-11-19 DIAGNOSIS — K3184 Gastroparesis: Secondary | ICD-10-CM

## 2013-11-19 DIAGNOSIS — Z0289 Encounter for other administrative examinations: Secondary | ICD-10-CM

## 2013-11-19 NOTE — Telephone Encounter (Signed)
[facility] to notifiy pt his test is tomorrow at [facility]. Register 7:45 for a 8 am appt. NPO after midight. Test takes about 2 hours.

## 2013-11-19 NOTE — Progress Notes (Signed)
   Subjective:    Patient ID: [name], male    DOB: 1956-05-28, 58 y.o.   MRN: [id number]  HPI Patient is here today because he has been experiencing loss of appetite. This has been present for about 1 month now. Patient states when he tries to eat, it makes him very nauseated. Patient is having a difficult time drinking fluids also.  This patient has lost significant amount of weight over the past month. Very concerned what's going on. He denies rectal bleeding denies vomiting blood. Denies dysphagia. Denies being depressed but he does state he has low energy and doesn't feel like doing anything. He states he eats a little bit of food then he puts it down because he feels nauseous he denies headaches. He has had upper endoscopy and colonoscopy earlier this year unremarkable findings Patient states he has no other concerns at this time.  No dysphagia   Review of Systems  Constitutional: Positive for fatigue. Negative for fever.  Respiratory: Positive for shortness of breath. Negative for cough.   Cardiovascular: Negative for chest pain.  Gastrointestinal: Positive for nausea and vomiting. Negative for abdominal pain.       Objective:   Physical Exam  Vitals reviewed. Constitutional: He appears well-nourished. No distress.  Cardiovascular: Normal rate, regular rhythm and normal heart sounds.   No murmur heard. Pulmonary/Chest: Effort normal and breath sounds normal. No respiratory distress.  Musculoskeletal: He exhibits no edema.  Lymphadenopathy:    He has no cervical adenopathy.  Neurological: He is alert.  Psychiatric: His behavior is normal.          Assessment & Plan:  Nausea and weight loss- gastric emptying study first if negative then CT abd/pelv with contrast, 25 minutes spent with patient discussing his symptoms discussing eating small frequent meals also discussed the importance of pursuing other avenues including possible gastroenterology consultation if these  do not find evidence of what's going on. Recheck in 2 weeks' time.

## 2013-11-19 NOTE — Telephone Encounter (Signed)
Discussed with patient

## 2013-11-20 ENCOUNTER — Encounter (HOSPITAL_COMMUNITY): Payer: Self-pay

## 2013-11-20 ENCOUNTER — Encounter: Payer: Self-pay | Admitting: Family Medicine

## 2013-11-20 ENCOUNTER — Encounter (HOSPITAL_COMMUNITY)
Admission: RE | Admit: 2013-11-20 | Discharge: 2013-11-20 | Disposition: A | Discharge status: Home / Self Care | Payer: BC Managed Care – PPO | Source: Ambulatory Visit | Attending: Family Medicine | Admitting: Family Medicine

## 2013-11-20 DIAGNOSIS — R1013 Epigastric pain: Principal | ICD-10-CM

## 2013-11-20 DIAGNOSIS — R110 Nausea: Secondary | ICD-10-CM | POA: Insufficient documentation

## 2013-11-20 DIAGNOSIS — K3184 Gastroparesis: Secondary | ICD-10-CM | POA: Insufficient documentation

## 2013-11-20 DIAGNOSIS — K3189 Other diseases of stomach and duodenum: Secondary | ICD-10-CM | POA: Insufficient documentation

## 2013-11-20 DIAGNOSIS — R634 Abnormal weight loss: Secondary | ICD-10-CM | POA: Insufficient documentation

## 2013-11-20 HISTORY — DX: Unspecified asthma, uncomplicated: J45.909

## 2013-11-20 MED ORDER — TECHNETIUM TC 99M SULFUR COLLOID
2.0000 | Freq: Once | INTRAVENOUS | Status: AC | PRN
  Administered 2013-11-20: 2 via ORAL

## 2013-11-20 NOTE — Addendum Note (Signed)
Addended byCharolotte Capuchin D on: 11/20/2013 04:08 PM   Modules accepted: Orders

## 2013-11-22 ENCOUNTER — Encounter: Payer: Self-pay | Admitting: Internal Medicine

## 2013-11-27 ENCOUNTER — Encounter (HOSPITAL_COMMUNITY): Payer: Self-pay | Admitting: Psychiatry

## 2013-11-27 ENCOUNTER — Ambulatory Visit (INDEPENDENT_AMBULATORY_CARE_PROVIDER_SITE_OTHER): Payer: BC Managed Care – PPO | Admitting: Psychiatry

## 2013-11-27 VITALS — BP 110/70 | Ht 70.0 in | Wt 172.0 lb

## 2013-11-27 DIAGNOSIS — F332 Major depressive disorder, recurrent severe without psychotic features: Secondary | ICD-10-CM

## 2013-11-27 DIAGNOSIS — F418 Other specified anxiety disorders: Secondary | ICD-10-CM

## 2013-11-27 DIAGNOSIS — F411 Generalized anxiety disorder: Secondary | ICD-10-CM

## 2013-11-27 MED ORDER — FLUOXETINE HCL 40 MG PO CAPS: 40.0000 mg | ORAL_CAPSULE | Freq: Every day | ORAL | Status: DC

## 2013-11-27 MED ORDER — ALPRAZOLAM 1 MG PO TABS: 1.0000 mg | ORAL_TABLET | Freq: Three times a day (TID) | ORAL | Status: DC | PRN

## 2013-11-27 MED ORDER — OLANZAPINE 10 MG PO TABS: 10.0000 mg | ORAL_TABLET | Freq: Every day | ORAL | Status: DC

## 2013-11-27 NOTE — Progress Notes (Signed)
Patient ID: [name], male   DOB: 01-27-1956, 58 y.o.   MRN: [id number] Patient ID: [name], male   DOB: [birth year], 58 y.o.   MRN: [id number] Patient ID: [name], male   DOB: 09-07-1955, 58 y.o.   MRN: [id number] Patient ID: [name], male   DOB: 02-25-1956, 58 y.o.   MRN: [id number] Patient ID: [name], male   DOB: 11/24/1955, 58 y.o.   MRN: [id number] [place] [address] Progress Note NYLAN [name] MRN: [id number] DOB: 30-Jun-1955 Age: 58 y.o.  Date: 11/27/2013 Start Time: 9:50 AM End Time: 10:05 AM  Chief Complaint: Chief Complaint  Patient presents with  . Anxiety  . Depression  . Follow-up    Subjective:  "I've been having trouble eating  This patient is a 58 year old married white male lives with his wife in [place]. He is on disability for coronary artery disease and COPD. He used to work for the [employer] but has not worked for the last 6 years. He has 3 step children 1 adopted child and several grandchildren.  The patient states he's been depressed and anxious primarily since he stopped working 6 years ago. He's also been through several deaths in his family his brother died suddenly 4 years ago. Following that his sister-in-law died in his home 8 months after his brother died. The patient's wife was caring for his wife's mother and she died last month at the age of 3. All of these things are disturbing to him. He's had trouble sleeping because he is thinking about the deaths. He's more depressed but not suicidal. He's not eating well and has lost about 20 pounds. He also has restless legs and chronic pain in his legs which keep him awake at night. He enjoys hunting and fishing but is less these interests recently.  The patient returns after 3 months. Recently he has been having difficulty eating and feels nauseated every time he eats. He had lost about 11 pounds. Dr.-Luking had ordered a gastric emptying study which showed that his gastric emptying is  slow and he has to eat several small meals a day. He claims he ran out of Zyprexa month early but no one ever called regarding this. Consequently he's had more difficulty sleeping. His mood is been fairly stable and he denies suicidal ideation .  Diagnosis:   Axis I: Anxiety Disorder NOS and Major Depression, Recurrent severe Axis II: Deferred Axis III:  Past Medical History  Diagnosis Date  . ASCVD (arteriosclerotic cardiovascular disease)     inferior MI with circumflex PTCA in 1993;cath in 2000-50% OM 1&2,30% LAD ;2008-25% LAD, 80% nondominant right ,60% small OM 2,EF of 55% with severe basilar inferior hypokinesis; stress nuclear study in 04/2008 moderate inferior scar with peri-infarction ischemic  . Syncope   . Hyperlipidemia   . Hypertension   . Tobacco abuse     50 pack years continuing at one halp pack daily  . COPD (chronic obstructive pulmonary disease)     mild ;excercise induced hypoxemia by cp stress test ;asthma ,bronchitis,  . Restless leg syndrome   . Anxiety and depression   . GERD (gastroesophageal reflux disease)   . Inflammatory polyps of colon with rectal bleeding   . Carcinoma in situ of colon 2004    rectal polyp  . H. pylori infection 2004    treated  . Sleep apnea     does not use CPAP  . Gastritis 12/30/10    EGD  Dr [name]  . Colitis, ischemic 2011  . Tubular adenoma of colon 06/2009    Colonosocpy Dr [name]  . Leukocytosis     [name]  . Depression   . Anxiety   . Lung nodule 07/16/2011  . CAD (coronary artery disease)   . Diverticulosis   . Hemorrhoids   . Asthma    Axis IV: other psychosocial or environmental problems Axis V: 41-50 serious symptoms  ADL's:  Intact  Sleep: Fair  Appetite:  Poor  Allergies: No Known Allergies Medical History: Past Medical History  Diagnosis Date  . ASCVD (arteriosclerotic cardiovascular disease)     inferior MI with circumflex PTCA in 1993;cath in 2000-50% OM 1&2,30% LAD ;2008-25% LAD, 80%  nondominant right ,60% small OM 2,EF of 55% with severe basilar inferior hypokinesis; stress nuclear study in 04/2008 moderate inferior scar with peri-infarction ischemic  . Syncope   . Hyperlipidemia   . Hypertension   . Tobacco abuse     50 pack years continuing at one halp pack daily  . COPD (chronic obstructive pulmonary disease)     mild ;excercise induced hypoxemia by cp stress test ;asthma ,bronchitis,  . Restless leg syndrome   . Anxiety and depression   . GERD (gastroesophageal reflux disease)   . Inflammatory polyps of colon with rectal bleeding   . Carcinoma in situ of colon 2004    rectal polyp  . H. pylori infection 2004    treated  . Sleep apnea     does not use CPAP  . Gastritis 12/30/10    EGD Dr [name]  . Colitis, ischemic 2011  . Tubular adenoma of colon 06/2009    Colonosocpy Dr [name]  . Leukocytosis     [name]  . Depression   . Anxiety   . Lung nodule 07/16/2011  . CAD (coronary artery disease)   . Diverticulosis   . Hemorrhoids   . Asthma    Surgical History: Past Surgical History  Procedure Laterality Date  . Colonoscopy w/ polypectomy  2004    rectal polyp with carcinoma in situ removed via colonoscopy  . Hand surgery      surgical intervention for injury of the fingers of the left hand many years ago  . Colonoscopy  06/2009    normal terminal ileum, segmental mild inflammation of sigmoid colon (bx unremarkable), polyp, tubular adenoma  . Esophagogastroduodenoscopy  02/2009    query Barrett's but bx negative  . Esophagogastroduodenoscopy  12/30/2010    [name],gastritis, dilated 26F, sm HH, 1 small ulcer, Duodenal erosions, benign bx  . Flexible sigmoidoscopy  12/30/2010     [name],; internal hemorrhoids, anal papilla  . Heart stent    . Nasal septoplasty w/ turbinoplasty  10/06/2011    Procedure: NASAL SEPTOPLASTY WITH TURBINATE REDUCTION;  Surgeon: Izora Gala, MD;  Location: [facility];  Service: ENT;  Laterality: Bilateral;  .  Cardiac catheterization    . Coronary angioplasty with stent placement  01/26/2012    "1; total is now 2"  . Esophagogastroduodenoscopy (egd) with esophageal dilation N/A 09/12/2012    [id number] Schatzki's ring s/p Maloney dilator. Small hiatal hernia. negative path  . Cholecystectomy  2004  . Colonoscopy N/A 07/31/2013    Dr.Rourk- redundant anal canal hemorrhoids, colonic diverticulosis, tubular adenoma  . Esophagogastroduodenoscopy (egd) with esophageal dilation N/A 07/31/2013    Dr. [name]- normal egd  . Hemorrhoid banding      Dr. [name]   Family History: family history includes  Cancer in his maternal aunt and paternal uncle; Depression in his mother; Heart disease in his mother; Hypertension in his brother; Kidney failure in his maternal uncle; Lung disease in his father. There is no history of Colon cancer, ADD / ADHD, Alcohol abuse, Drug abuse, Anxiety disorder, Bipolar disorder, Dementia, OCD, Paranoid behavior, Schizophrenia, Physical abuse, Sexual abuse, Seizures, or Colon cancer. Reviewed and nothing new today.  Suicidal Ideation:  Pt denies Homicidal Ideation:  Pt has some struggles with his daughter's boy friend.  He stays away from him. AEB (as evidenced by):per pt report  Psychiatric Specialty Exam: ROS Neuro: no headaches, ataxia, weakness, gives out easily GI: no N/V/cramps/constipation, he keeps diarrhea for the past 6 or 7 years MS: no weakness or aches. He catches cramps really bad.   Blood pressure 110/70, height 5\' 10"  (1.778 m), weight 172 lb (78.019 kg).Body mass index is 24.68 kg/(m^2).  General Appearance: Casual  Eye Contact::  Fair  Speech:  Clear and Coherent  Volume:  Normal  Mood:  Anxious ,    Affect:  Congruent  Thought Process:  Coherent, Linear and Logical  Orientation:  Full (Time, Place, and Person)  Thought Content:  Within normal limits   Suicidal Thoughts:  no  Homicidal Thoughts:  No  Memory:  Immediate;   Fair Recent;   Fair Remote;    Fair  Judgement:  Fair  Insight:  Fair  Psychomotor Activity:  Normal  Concentration:  Fair  Recall:  Fair  Akathisia:  No  Handed:  Right  AIMS (if indicated):     Assets:  Communication Skills Desire for Improvement  Sleep:      Current Medications: Zyprexa 5 mg at bed time Xanax 1 mg TID Neurontin 600 mg 3 times a day Prozac 40 mg per day Lab Results: No results found for this or any previous visit (from the past 48 hour(s)). Cardiologist draws periodic labs.  He has not heard back on the last set.  They usually tell him if there are concerns.  Physical Findings: AIMS:  , ,  ,  ,    CIWA:    COWS:     Treatment Plan Summary: Medication management  Plan: I took his vitals.  I reviewed CC, tobacco/med/surg Hx, meds effects/ side effects, problem list, therapies and responses as well as current situation/symptoms discussed options. Continue current medicationsHe'll return to see me in 3 months but call if symptoms worsen before that See orders and pt instructions for more details.  MEDICATIONS this encounter: Meds ordered this encounter  Medications  . OLANZapine (ZYPREXA) 10 MG tablet    Sig: Take 1 tablet (10 mg total) by mouth at bedtime.    Dispense:  30 tablet    Refill:  2  . FLUoxetine (PROZAC) 40 MG capsule    Sig: Take 1 capsule (40 mg total) by mouth daily.    Dispense:  30 capsule    Refill:  2  . ALPRAZolam (XANAX) 1 MG tablet    Sig: Take 1 tablet (1 mg total) by mouth 3 (three) times daily as needed for anxiety.    Dispense:  90 tablet    Refill:  2    Medical Decision Making Problem Points:  Established problem, stable/improving (1), Review of last therapy session (1) and Review of psycho-social stressors (1) Data Points:  Review or order clinical lab tests (1) Review of medication regiment & side effects (2)  I certify that outpatient services furnished can reasonably be expected  to improve the patient's condition.   ROSS,  [facility] 11/27/2013, 9:04 AM

## 2013-11-29 ENCOUNTER — Ambulatory Visit (INDEPENDENT_AMBULATORY_CARE_PROVIDER_SITE_OTHER): Payer: BC Managed Care – PPO | Admitting: Gastroenterology

## 2013-11-29 ENCOUNTER — Encounter: Payer: Self-pay | Admitting: Gastroenterology

## 2013-11-29 VITALS — BP 128/82 | HR 69 | Temp 97.1°F | Ht 71.0 in | Wt 170.0 lb

## 2013-11-29 DIAGNOSIS — K3184 Gastroparesis: Secondary | ICD-10-CM

## 2013-11-29 MED ORDER — ONDANSETRON HCL 4 MG PO TABS: 4.0000 mg | ORAL_TABLET | Freq: Three times a day (TID) | ORAL | Status: DC | PRN

## 2013-11-29 MED ORDER — PANTOPRAZOLE SODIUM 40 MG PO TBEC: 40.0000 mg | DELAYED_RELEASE_TABLET | Freq: Two times a day (BID) | ORAL | Status: DC

## 2013-11-29 NOTE — Progress Notes (Signed)
Referring Provider: [name], MD Primary Care Physician:  Sallee Lange, MD Primary GI: Dr. [name]   Chief Complaint  Patient presents with  . Nausea    HPI:  [name] returns today in follow-up at the request of Dr. [name]. TCS/EGD in Feb 2015 as outlined below. Persistent nausea led to GES, ordered by PCP. Revealed gastroparesis.   Notes 4-5 weeks of nausea, worsened with eating. Had been in the 180s, now 170s. No abdominal pain. Occasional vomiting. Pill dysphagia occasionally. Sometimes solid food dysphagia. EGD in Feb 2015 with empiric dilation.   Past Medical History  Diagnosis Date  . ASCVD (arteriosclerotic cardiovascular disease)     inferior MI with circumflex PTCA in 1993;cath in 2000-50% OM 1&2,30% LAD ;2008-25% LAD, 80% nondominant right ,60% small OM 2,EF of 55% with severe basilar inferior hypokinesis; stress nuclear study in 04/2008 moderate inferior scar with peri-infarction ischemic  . Syncope   . Hyperlipidemia   . Hypertension   . Tobacco abuse     50 pack years continuing at one halp pack daily  . COPD (chronic obstructive pulmonary disease)     mild ;excercise induced hypoxemia by cp stress test ;asthma ,bronchitis,  . Restless leg syndrome   . Anxiety and depression   . GERD (gastroesophageal reflux disease)   . Inflammatory polyps of colon with rectal bleeding   . Carcinoma in situ of colon 2004    rectal polyp  . H. pylori infection 2004    treated  . Sleep apnea     does not use CPAP  . Gastritis 12/30/10    EGD Dr [name]  . Colitis, ischemic 2011  . Tubular adenoma of colon 06/2009    Colonosocpy Dr [name]  . Leukocytosis     [name]  . Depression   . Anxiety   . Lung nodule 07/16/2011  . CAD (coronary artery disease)   . Diverticulosis   . Hemorrhoids   . Asthma     Past Surgical History  Procedure Laterality Date  . Colonoscopy w/ polypectomy  2004    rectal polyp with carcinoma in situ removed via colonoscopy    . Hand surgery      surgical intervention for injury of the fingers of the left hand many years ago  . Colonoscopy  06/2009    normal terminal ileum, segmental mild inflammation of sigmoid colon (bx unremarkable), polyp, tubular adenoma  . Esophagogastroduodenoscopy  02/2009    query Barrett's but bx negative  . Esophagogastroduodenoscopy  12/30/2010    [name],gastritis, dilated 36F, sm HH, 1 small ulcer, Duodenal erosions, benign bx  . Flexible sigmoidoscopy  12/30/2010     [name],; internal hemorrhoids, anal papilla  . Heart stent    . Nasal septoplasty w/ turbinoplasty  10/06/2011    Procedure: NASAL SEPTOPLASTY WITH TURBINATE REDUCTION;  Surgeon: Izora Gala, MD;  Location: [facility];  Service: ENT;  Laterality: Bilateral;  . Cardiac catheterization    . Coronary angioplasty with stent placement  01/26/2012    "1; total is now 2"  . Esophagogastroduodenoscopy (egd) with esophageal dilation N/A 09/12/2012    PYK:[id number] Schatzki's ring s/p Maloney dilator. Small hiatal hernia. negative path  . Cholecystectomy  2004  . Colonoscopy N/A 07/31/2013    Dr.Rourk- redundant anal canal hemorrhoids, colonic diverticulosis, tubular adenoma  . Esophagogastroduodenoscopy (egd) with esophageal dilation N/A 07/31/2013    Dr. [name]- normal egd, s/p [facility] dilation empirically. Normal small bowel biopsies   .  Hemorrhoid banding      Dr. [name]    Current Outpatient Prescriptions  Medication Sig Dispense Refill  . albuterol (PROVENTIL) (2.5 MG/3ML) 0.083% nebulizer solution Take 2.5 mg by nebulization every 6 (six) hours as needed. For shortness of breath      . albuterol-ipratropium (COMBIVENT) 18-103 MCG/ACT inhaler Inhale 2 puffs into the lungs every 6 (six) hours as needed. For shortness of breath      . ALPRAZolam (XANAX) 1 MG tablet Take 1 tablet (1 mg total) by mouth 3 (three) times daily as needed for anxiety.  90 tablet  2  . aspirin EC 81 MG tablet Take 81 mg by mouth daily.        Marland Kitchen atorvastatin (LIPITOR) 40 MG tablet Take 1 tablet (40 mg total) by mouth daily.  30 tablet  5  . budesonide-formoterol (SYMBICORT) 80-4.5 MCG/ACT inhaler Inhale 2 puffs into the lungs 2 (two) times daily.  1 Inhaler  12  . famotidine (PEPCID) 20 MG tablet Take 1 tablet (20 mg total) by mouth daily.  30 tablet  3  . FLUoxetine (PROZAC) 40 MG capsule Take 1 capsule (40 mg total) by mouth daily.  30 capsule  2  . gabapentin (NEURONTIN) 300 MG capsule Take 2 capsules (600 mg total) by mouth 3 (three) times daily.  180 capsule  2  . HYDROcodone-acetaminophen (NORCO) 7.5-325 MG per tablet Take 1 tablet by mouth 3 (three) times daily as needed for moderate pain.  90 tablet  0  . hydrocortisone (ANUSOL-HC) 2.5 % rectal cream Place 1 application rectally 2 (two) times daily.  30 g  1  . metoprolol succinate (TOPROL-XL) 25 MG 24 hr tablet TAKE 1 TABLET BY MOUTH EVERY DAY  30 tablet  6  . OLANZapine (ZYPREXA) 10 MG tablet Take 1 tablet (10 mg total) by mouth at bedtime.  30 tablet  2  . pantoprazole (PROTONIX) 40 MG tablet Take 1 tablet (40 mg total) by mouth 2 (two) times daily before a meal.  60 tablet  11  . verapamil (CALAN-SR) 180 MG CR tablet TAKE 1 TABLET EVERY DAY  30 tablet  10  . vitamin B-12 (CYANOCOBALAMIN) 1000 MCG tablet Take 2,000 mcg by mouth daily.      . nitroGLYCERIN (NITROSTAT) 0.4 MG SL tablet Place 0.4 mg under the tongue every 5 (five) minutes as needed for chest pain.      Marland Kitchen ondansetron (ZOFRAN) 4 MG tablet Take 1 tablet (4 mg total) by mouth every 8 (eight) hours as needed for nausea or vomiting.  90 tablet  1  . polyethylene glycol-electrolytes (NULYTELY/GOLYTELY) 420 G solution       . rOPINIRole (REQUIP) 0.5 MG tablet Take 0.5 mg by mouth 3 (three) times daily.      . [DISCONTINUED] amLODipine (NORVASC) 5 MG tablet Take 5 mg by mouth daily.        No current facility-administered medications for this visit.    Allergies as of 11/29/2013  . (No Known Allergies)    Family  History  Problem Relation Age of Onset  . Lung disease Father     deceased, black lung  . Heart disease Mother     blood clots  . Depression Mother   . Cancer Paternal Uncle     unknown type  . Colon cancer Neg Hx   . ADD / ADHD Neg Hx   . Alcohol abuse Neg Hx   . Drug abuse Neg Hx   . Anxiety disorder  Neg Hx   . Bipolar disorder Neg Hx   . Dementia Neg Hx   . OCD Neg Hx   . Paranoid behavior Neg Hx   . Schizophrenia Neg Hx   . Physical abuse Neg Hx   . Sexual abuse Neg Hx   . Seizures Neg Hx   . Cancer Maternal Aunt     unknown type  . Kidney failure Maternal Uncle   . Hypertension Brother   . Colon cancer Neg Hx     History   Social History  . Marital Status: Married    Spouse Name: N/A    Number of Children: 3  . Years of Education: N/A   Occupational History  . disable     [employer]   Social History Main Topics  . Smoking status: Former Smoker -- 0.50 packs/day for 30 years    Types: Cigarettes  . Smokeless tobacco: Never Used     Comment: now using electronic cigarettes  . Alcohol Use: No     Comment: Drinks a beer occasionally  . Drug Use: No  . Sexual Activity: None   Other Topics Concern  . None   Social History Narrative   3 stepchildren    Review of Systems: As mentioned in HPI.   Physical Exam: BP 128/82  Pulse 69  Temp(Src) 97.1 F (36.2 C) (Oral)  Ht 5\' 11"  (1.803 m)  Wt 170 lb (77.111 kg)  BMI 23.72 kg/m2 General:   Alert and oriented. No distress noted. Pleasant and cooperative.  Abdomen:  +BS, soft, non-tender and non-distended. No rebound or guarding. No HSM or masses noted. Msk:  Symmetrical without gross deformities. Normal posture. Extremities:  Without edema. Neurologic:  Alert and  oriented x4;  grossly normal neurologically. Skin:  Intact without significant lesions or rashes. Psych:  Alert and cooperative. Normal mood and affect.

## 2013-11-29 NOTE — Patient Instructions (Signed)
I have increased your reflux medication, Protonix, to twice a day.   Start taking Zofran with meals (three times a day).   Please review the gastroparesis diet and call me if you have any questions!!!   We will see you back in 4 weeks.   Gastroparesis  Gastroparesis is also called slowed stomach emptying (delayed gastric emptying). It is a condition in which the stomach takes too long to empty its contents. It often happens in people with diabetes.  CAUSES  Gastroparesis happens when nerves to the stomach are damaged or stop working. When the nerves are damaged, the muscles of the stomach and intestines do not work normally. The movement of food is slowed or stopped. High blood glucose (sugar) causes changes in nerves and can damage the blood vessels that carry oxygen and nutrients to the nerves. RISK FACTORS  Diabetes.  Post-viral syndromes.  Eating disorders (anorexia, bulimia).  Surgery on the stomach or vagus nerve.  Gastroesophageal reflux disease (rarely).  Smooth muscle disorders (amyloidosis, scleroderma).  Metabolic disorders, including hypothyroidism.  Parkinson disease. SYMPTOMS   Heartburn.  Feeling sick to your stomach (nausea).  Vomiting of undigested food.  An early feeling of fullness when eating.  Weight loss.  Abdominal bloating.  Erratic blood glucose levels.  Lack of appetite.  Gastroesophageal reflux.  Spasms of the stomach wall. Complications can include:  Bacterial overgrowth in stomach. Food stays in the stomach and can ferment and cause bacteria to grow.  Weight loss due to difficulty digesting and absorbing nutrients.  Vomiting.  Obstruction in the stomach. Undigested food can harden and cause nausea and vomiting.  Blood glucose fluctuations caused by inconsistent food absorption. DIAGNOSIS  The diagnosis of gastroparesis is confirmed through one or more of the following tests:  Barium X-rays and scans. These tests look at how  long it takes for food to move through the stomach.  Gastric manometry. This test measures electrical and muscular activity in the stomach. A thin tube is passed down the throat into the stomach. The tube contains a wire that takes measurements of the stomach's electrical and muscular activity as it digests liquids and solid food.  Endoscopy. This procedure is done with a long, thin tube called an endoscope. It is passed through the mouth and gently down the esophagus into the stomach. This tube helps the caregiver look at the lining of the stomach to check for any abnormalities.  Ultrasonography. This can rule out gallbladder disease or pancreatitis. This test will outline and define the shape of the gallbladder and pancreas. TREATMENT   Treatments may include:  Exercise.  Medicines to control nausea and vomiting.  Medicines to stimulate stomach muscles.  Changes in what and when you eat.  Having smaller meals more often.  Eating low-fiber forms of high-fiber foods, such as eating cooked vegetables instead of raw vegetables.  Eating low-fat foods.  Consuming liquids, which are easier to digest.  In severe cases, feeding tubes and intravenous (IV) feeding may be needed. It is important to note that in most cases, treatment does not cure gastroparesis. It is usually a lasting (chronic) condition. Treatment helps you manage the underlying condition so that you can be as healthy and comfortable as possible. Other treatments  A gastric neurostimulator has been developed to assist people with gastroparesis. The battery-operated device is surgically implanted. It emits mild electrical pulses to help improve stomach emptying and to control nausea and vomiting.  The use of botulinum toxin has been shown to improve  stomach emptying by decreasing the prolonged contractions of the muscle between the stomach and the small intestine (pyloric sphincter). The benefits are temporary. SEEK MEDICAL  CARE IF:   You have diabetes and you are having problems keeping your blood glucose in goal range.  You are having nausea, vomiting, bloating, or early feelings of fullness with eating.  Your symptoms do not change with a change in diet. Document Released: 05/31/2005 Document Revised: 09/25/2012 Document Reviewed: 11/07/2008 [facility] Patient Information 2015 [place], [place]. This information is not intended to replace advice given to you by your health care provider. Make sure you discuss any questions you have with your health care provider.

## 2013-12-03 ENCOUNTER — Telehealth (HOSPITAL_COMMUNITY): Payer: Self-pay | Admitting: *Deleted

## 2013-12-04 ENCOUNTER — Telehealth: Payer: Self-pay | Admitting: *Deleted

## 2013-12-04 ENCOUNTER — Encounter: Payer: Self-pay | Admitting: Gastroenterology

## 2013-12-04 NOTE — Telephone Encounter (Signed)
Pt's wife called stating pt was called in a zofran and the medication 300$ they cannot afford this, insurance will not pay. Please advise [phone]

## 2013-12-04 NOTE — Assessment & Plan Note (Signed)
58 year old male with newly diagnosed gastroparesis. Would like to avoid Reglan for now and rather adjust diet, add PPI BID, and Zofran prn. Close follow-up in 4 weeks.

## 2013-12-05 NOTE — Telephone Encounter (Signed)
We may need to do a PA.  Can we find out what the issue is with it?

## 2013-12-05 NOTE — Telephone Encounter (Signed)
Routing to AS- do you want to send in something else?

## 2013-12-05 NOTE — Progress Notes (Signed)
cc'd to pcp 

## 2013-12-06 NOTE — Telephone Encounter (Signed)
Can we check with pharmacy to see what is going on?

## 2013-12-06 NOTE — Telephone Encounter (Signed)
Called pharmacy- had to leave a voicemail

## 2013-12-07 NOTE — Telephone Encounter (Signed)
Received fax from pharmacy, zofran needs PA. Will work on it and send it to [facility].

## 2013-12-10 NOTE — Telephone Encounter (Signed)
Pt's wife called I made pt's wife aware of what Almyra Free is doing about his PA, pt's wife would liek to know if there is something he can take while waiting on the PA, please advise per wife pt is having a lot of abd bloating

## 2013-12-11 NOTE — Telephone Encounter (Signed)
I received additional questions about PA from insurance co and it has been filled out and faxed to the insurance co.  Routing to AS for further recommendations.

## 2013-12-12 MED ORDER — METOCLOPRAMIDE HCL 5 MG PO TABS: 5.0000 mg | ORAL_TABLET | Freq: Three times a day (TID) | ORAL | Status: DC

## 2013-12-12 MED ORDER — PROMETHAZINE HCL 12.5 MG PO TABS: 12.5000 mg | ORAL_TABLET | Freq: Four times a day (QID) | ORAL | Status: DC | PRN

## 2013-12-12 NOTE — Telephone Encounter (Signed)
I have sent in [name] to the pharmacy. He is to take this with meals and at bedtime if needed. I only sent in a month supply. He needs better symptom control. Hopefully, this will help get him back on track.   HOWEVER, HE IS ON PROZAC. This combination can increase the risk of extrapyramidal symptoms. I want him to [name]. I reviewed this with Ronalee Belts, pharmacist at [facility]. Ideally, he would not be on Prozac, but he needs this as well. Taking Prozac every other day will hopefully cut down on that risk. I do not want him on Reglan long-term. If he is able to take it just twice a day, that is great. He needs to monitor for any concerning symptoms such as any involuntary muscle movements, tremors, rigidity. If this occurs, stop Reglan immediately.  I have also sent in Phenergan to take only prn for severe nausea. Once Zofran is approved, I would rather he take that instead of Reglan.

## 2013-12-12 NOTE — Telephone Encounter (Signed)
Pts wife is aware 

## 2013-12-13 NOTE — Telephone Encounter (Signed)
zofran has been approved and pts wife is aware.

## 2013-12-19 ENCOUNTER — Ambulatory Visit (INDEPENDENT_AMBULATORY_CARE_PROVIDER_SITE_OTHER): Payer: BC Managed Care – PPO | Admitting: Family Medicine

## 2013-12-19 ENCOUNTER — Encounter: Payer: Self-pay | Admitting: Family Medicine

## 2013-12-19 VITALS — BP 120/82 | Ht 70.0 in | Wt 174.4 lb

## 2013-12-19 DIAGNOSIS — K3184 Gastroparesis: Secondary | ICD-10-CM

## 2013-12-19 MED ORDER — HYDROCODONE-ACETAMINOPHEN 7.5-325 MG PO TABS: 1.0000 | ORAL_TABLET | Freq: Three times a day (TID) | ORAL | Status: DC | PRN

## 2013-12-19 NOTE — Progress Notes (Signed)
   Subjective:    Patient ID: [name], male    DOB: Aug 22, 1955, 58 y.o.   MRN: [id number]  HPI Patient is here today for a follow up visit on his weight loss and nausea. Patient states that his symptoms are about the same. He is eating a little more than he was but still has trouble with digesting food.   Patient states he has no other concerns at this time.    Review of Systems  Constitutional: Negative for activity change, appetite change and fatigue.  HENT: Negative for congestion.   Respiratory: Negative for cough.   Cardiovascular: Negative for chest pain.  Gastrointestinal: Negative for abdominal pain.  Endocrine: Negative for polydipsia and polyphagia.  Neurological: Negative for weakness.  Psychiatric/Behavioral: Negative for confusion.       Objective:   Physical Exam  Vitals reviewed. Constitutional: He appears well-nourished. No distress.  Cardiovascular: Normal rate, regular rhythm and normal heart sounds.   No murmur heard. Pulmonary/Chest: Effort normal and breath sounds normal. No respiratory distress.  Musculoskeletal: He exhibits no edema.  Lymphadenopathy:    He has no cervical adenopathy.  Neurological: He is alert.  Psychiatric: His behavior is normal.          Assessment & Plan:  He actually has decent weight gain which is refreshing.  Gastroparesis fairly severe Reglan seems to be helping a little using Zofran for nausea  Psychiatric illness is stable he will followup with Dr. Harrington Challenger in September. I am not sure if any other medicine would get along with Reglan any better then what he is currently taking but he is at risk of tardive dyskinesia. He was instructed what the side effects of look like him to immediately stop Reglan if he was to have this. He was also encouraged not to use Reglan any more frequent than what he needed to keep follows up with gastroenterology next. We will message Dr. Harrington Challenger to see if she has any suggestions of different  psychiatric medicines that could be used.

## 2013-12-25 ENCOUNTER — Ambulatory Visit: Payer: Self-pay | Admitting: Gastroenterology

## 2013-12-27 ENCOUNTER — Encounter (INDEPENDENT_AMBULATORY_CARE_PROVIDER_SITE_OTHER): Payer: Self-pay

## 2013-12-27 ENCOUNTER — Ambulatory Visit (INDEPENDENT_AMBULATORY_CARE_PROVIDER_SITE_OTHER): Payer: BC Managed Care – PPO | Admitting: Gastroenterology

## 2013-12-27 ENCOUNTER — Encounter: Payer: Self-pay | Admitting: Gastroenterology

## 2013-12-27 VITALS — BP 127/78 | HR 68 | Temp 96.7°F | Ht 71.0 in | Wt 175.0 lb

## 2013-12-27 DIAGNOSIS — K3184 Gastroparesis: Secondary | ICD-10-CM

## 2013-12-27 DIAGNOSIS — K625 Hemorrhage of anus and rectum: Secondary | ICD-10-CM

## 2013-12-27 MED ORDER — NITROGLYCERIN 0.4 % RE OINT: 1.0000 [in_us] | TOPICAL_OINTMENT | Freq: Two times a day (BID) | RECTAL | Status: DC

## 2013-12-27 MED ORDER — DRONABINOL 2.5 MG PO CAPS: 2.5000 mg | ORAL_CAPSULE | Freq: Two times a day (BID) | ORAL | Status: DC

## 2013-12-27 NOTE — Patient Instructions (Addendum)
For constipation: take 1 dose of Miralax each evening as needed for constipation. I have sent a nitroglycerin cream to use per rectum twice a day for 10 days. Make sure you wear gloves and wash your hands after application. This is to help heal any fissure that may be present in your rectum.   Take Zofran only sparingly. Use Phenergan every 6-8 hours as needed for breakthrough nausea.  Continue Reglan four times a day with meals and at bedtime. TAKE PROZAC EVERY OTHER DAY.   I've added Marinol to take twice a day to hopefully help with your symptoms.   Call me on Monday with an update.  We will see you in 4 weeks.

## 2013-12-27 NOTE — Assessment & Plan Note (Signed)
Known hemorrhoids, recent colonoscopy Feb 2015. Hemorrhoid banding X 3 completed earlier this year. Query occult anal fissure possibly. Add nitro cream BID. Pt declined rectal exam.

## 2013-12-27 NOTE — Progress Notes (Signed)
Referring Provider: [name], MD Primary Care Physician:  Sallee Lange, MD Primary GI: Dr. [name]   Chief Complaint  Patient presents with  . Follow-up    abdominal pressure  and constipation    HPI:   [name] presents today with newly diagnosed gastroparesis. Last seen June 2015 and started on Zofran prn, PPI BID. Reglan started July 1st. Due to taking Prozac, risk of extrapyramidal symptoms. Asked to take Prozac every other day. Up 5 lbs from visit. Nausea with some improvement. "better than it was". Feels bloated, worse. Feels tight. Feels like he can't put anything in it. Just eating 2 meals per day, states not a whole lot. BM usually each morning but hasn't been going as much lately. Sometimes once a day, sometimes not at all. Had episode of rectal bleeding last week, now resolved. Sometimes itching and burning of rectum. Uses Anusol with some improvement.    Past Medical History  Diagnosis Date  . ASCVD (arteriosclerotic cardiovascular disease)     inferior MI with circumflex PTCA in 1993;cath in 2000-50% OM 1&2,30% LAD ;2008-25% LAD, 80% nondominant right ,60% small OM 2,EF of 55% with severe basilar inferior hypokinesis; stress nuclear study in 04/2008 moderate inferior scar with peri-infarction ischemic  . Syncope   . Hyperlipidemia   . Hypertension   . Tobacco abuse     50 pack years continuing at one halp pack daily  . COPD (chronic obstructive pulmonary disease)     mild ;excercise induced hypoxemia by cp stress test ;asthma ,bronchitis,  . Restless leg syndrome   . Anxiety and depression   . GERD (gastroesophageal reflux disease)   . Inflammatory polyps of colon with rectal bleeding   . Carcinoma in situ of colon 2004    rectal polyp  . H. pylori infection 2004    treated  . Sleep apnea     does not use CPAP  . Gastritis 12/30/10    EGD Dr [name]  . Colitis, ischemic 2011  . Tubular adenoma of colon 06/2009    Colonosocpy Dr [name]  . Leukocytosis       [name]  . Depression   . Anxiety   . Lung nodule 07/16/2011  . CAD (coronary artery disease)   . Diverticulosis   . Hemorrhoids   . Asthma     Past Surgical History  Procedure Laterality Date  . Colonoscopy w/ polypectomy  2004    rectal polyp with carcinoma in situ removed via colonoscopy  . Hand surgery      surgical intervention for injury of the fingers of the left hand many years ago  . Colonoscopy  06/2009    normal terminal ileum, segmental mild inflammation of sigmoid colon (bx unremarkable), polyp, tubular adenoma  . Esophagogastroduodenoscopy  02/2009    query Barrett's but bx negative  . Esophagogastroduodenoscopy  12/30/2010    [name],gastritis, dilated 62F, sm HH, 1 small ulcer, Duodenal erosions, benign bx  . Flexible sigmoidoscopy  12/30/2010     [name],; internal hemorrhoids, anal papilla  . Heart stent    . Nasal septoplasty w/ turbinoplasty  10/06/2011    Procedure: NASAL SEPTOPLASTY WITH TURBINATE REDUCTION;  Surgeon: Izora Gala, MD;  Location: [facility];  Service: ENT;  Laterality: Bilateral;  . Cardiac catheterization    . Coronary angioplasty with stent placement  01/26/2012    "1; total is now 2"  . Esophagogastroduodenoscopy (egd) with esophageal dilation N/A 09/12/2012  [id number] Schatzki's ring s/p Maloney dilator. Small hiatal hernia. negative path  . Cholecystectomy  2004  . Colonoscopy N/A 07/31/2013    Dr.Rourk- redundant anal canal hemorrhoids, colonic diverticulosis, tubular adenoma  . Esophagogastroduodenoscopy (egd) with esophageal dilation N/A 07/31/2013    Dr. [name]- normal egd, s/p [facility] dilation empirically. Normal small bowel biopsies   . Hemorrhoid banding      Dr. [name]    Current Outpatient Prescriptions  Medication Sig Dispense Refill  . albuterol (PROVENTIL) (2.5 MG/3ML) 0.083% nebulizer solution Take 2.5 mg by nebulization every 6 (six) hours as needed. For shortness of breath      .  albuterol-ipratropium (COMBIVENT) 18-103 MCG/ACT inhaler Inhale 2 puffs into the lungs every 6 (six) hours as needed. For shortness of breath      . ALPRAZolam (XANAX) 1 MG tablet Take 1 tablet (1 mg total) by mouth 3 (three) times daily as needed for anxiety.  90 tablet  2  . aspirin EC 81 MG tablet Take 81 mg by mouth daily.       Marland Kitchen atorvastatin (LIPITOR) 40 MG tablet Take 1 tablet (40 mg total) by mouth daily.  30 tablet  5  . budesonide-formoterol (SYMBICORT) 80-4.5 MCG/ACT inhaler Inhale 2 puffs into the lungs 2 (two) times daily.  1 Inhaler  12  . famotidine (PEPCID) 20 MG tablet Take 1 tablet (20 mg total) by mouth daily.  30 tablet  3  . FLUoxetine (PROZAC) 40 MG capsule Take 1 capsule (40 mg total) by mouth daily.  30 capsule  2  . gabapentin (NEURONTIN) 300 MG capsule Take 2 capsules (600 mg total) by mouth 3 (three) times daily.  180 capsule  2  . HYDROcodone-acetaminophen (NORCO) 7.5-325 MG per tablet Take 1 tablet by mouth 3 (three) times daily as needed for moderate pain.  90 tablet  0  . hydrocortisone (ANUSOL-HC) 2.5 % rectal cream Place 1 application rectally 2 (two) times daily.  30 g  1  . metoCLOPramide (REGLAN) 5 MG tablet Take 1 tablet (5 mg total) by mouth 4 (four) times daily -  before meals and at bedtime.  120 tablet  0  . metoprolol succinate (TOPROL-XL) 25 MG 24 hr tablet TAKE 1 TABLET BY MOUTH EVERY DAY  30 tablet  6  . OLANZapine (ZYPREXA) 10 MG tablet Take 1 tablet (10 mg total) by mouth at bedtime.  30 tablet  2  . ondansetron (ZOFRAN) 4 MG tablet Take 1 tablet (4 mg total) by mouth every 8 (eight) hours as needed for nausea or vomiting.  90 tablet  1  . pantoprazole (PROTONIX) 40 MG tablet Take 1 tablet (40 mg total) by mouth 2 (two) times daily before a meal.  60 tablet  11  . promethazine (PHENERGAN) 12.5 MG tablet Take 1 tablet (12.5 mg total) by mouth every 6 (six) hours as needed for nausea or vomiting.  30 tablet  0  . rOPINIRole (REQUIP) 0.5 MG tablet Take 0.5  mg by mouth 3 (three) times daily.      . verapamil (CALAN-SR) 180 MG CR tablet TAKE 1 TABLET EVERY DAY  30 tablet  10  . vitamin B-12 (CYANOCOBALAMIN) 1000 MCG tablet Take 2,000 mcg by mouth daily.      Marland Kitchen dronabinol (MARINOL) 2.5 MG capsule Take 1 capsule (2.5 mg total) by mouth 2 (two) times daily.  60 capsule  1  . Nitroglycerin 0.4 % OINT Place 1 inch rectally 2 (two) times daily. For 14 days  1 Tube  0  . [DISCONTINUED] amLODipine (NORVASC) 5 MG tablet Take 5 mg by mouth daily.        No current facility-administered medications for this visit.    Allergies as of 12/27/2013  . (No Known Allergies)    Family History  Problem Relation Age of Onset  . Lung disease Father     deceased, black lung  . Heart disease Mother     blood clots  . Depression Mother   . Cancer Paternal Uncle     unknown type  . Colon cancer Neg Hx   . ADD / ADHD Neg Hx   . Alcohol abuse Neg Hx   . Drug abuse Neg Hx   . Anxiety disorder Neg Hx   . Bipolar disorder Neg Hx   . Dementia Neg Hx   . OCD Neg Hx   . Paranoid behavior Neg Hx   . Schizophrenia Neg Hx   . Physical abuse Neg Hx   . Sexual abuse Neg Hx   . Seizures Neg Hx   . Cancer Maternal Aunt     unknown type  . Kidney failure Maternal Uncle   . Hypertension Brother   . Colon cancer Neg Hx     History   Social History  . Marital Status: Married    Spouse Name: N/A    Number of Children: 3  . Years of Education: N/A   Occupational History  . disable     [employer]   Social History Main Topics  . Smoking status: Former Smoker -- 0.50 packs/day for 30 years    Types: Cigarettes  . Smokeless tobacco: Never Used     Comment: now using electronic cigarettes  . Alcohol Use: No     Comment: Drinks a beer occasionally  . Drug Use: No  . Sexual Activity: None   Other Topics Concern  . None   Social History Narrative   3 stepchildren    Review of Systems: As mentioned in HPI  Physical Exam: BP 127/78  Pulse 68  Temp(Src)  96.7 F (35.9 C) (Oral)  Ht 5\' 11"  (1.803 m)  Wt 175 lb (79.379 kg)  BMI 24.42 kg/m2 General:   Alert and oriented. No distress noted. Pleasant and cooperative.  Head:  Normocephalic and atraumatic. Abdomen:  +BS, soft, non-tender and non-distended. No rebound or guarding. No HSM or masses noted. Msk:  Symmetrical without gross deformities. Normal posture. Extremities:  Without edema. Neurologic:  Alert and  oriented x4;  grossly normal neurologically. Skin:  Intact without significant lesions or rashes. Psych:  Alert and cooperative. Normal mood and affect.

## 2013-12-27 NOTE — Assessment & Plan Note (Addendum)
Some improvement with Reglan, noted weight gain, persistent abdominal bloating and now with some constipation. To limit possible med side effects, reduce Zofran to using only sparingly. Phenergan for breakthrough nausea. Continue with Reglan low dose QID but TAKE PROZAC EVERY OTHER DAY due to risk of extrapyramidal symptoms, which were discussed in detail today (already discussed with pharmacy previously). Will remain on low dose Reglan as he has noted some symptomatic improvement with this, but I would like to wean him off when able. Add Marinol 2.5 mg po BID. Continue PPI BID. Contact me next week with update. EGD up-to-date. 4 week f/u with Dr. [name].

## 2013-12-30 ENCOUNTER — Other Ambulatory Visit (HOSPITAL_COMMUNITY): Payer: Self-pay | Admitting: Psychiatry

## 2014-01-03 ENCOUNTER — Telehealth (HOSPITAL_COMMUNITY): Payer: Self-pay | Admitting: *Deleted

## 2014-01-03 ENCOUNTER — Other Ambulatory Visit (HOSPITAL_COMMUNITY): Payer: Self-pay | Admitting: Psychiatry

## 2014-01-03 DIAGNOSIS — F5105 Insomnia due to other mental disorder: Secondary | ICD-10-CM

## 2014-01-03 DIAGNOSIS — F418 Other specified anxiety disorders: Secondary | ICD-10-CM

## 2014-01-03 DIAGNOSIS — F17200 Nicotine dependence, unspecified, uncomplicated: Secondary | ICD-10-CM

## 2014-01-03 MED ORDER — GABAPENTIN 300 MG PO CAPS: 600.0000 mg | ORAL_CAPSULE | Freq: Three times a day (TID) | ORAL | Status: DC

## 2014-01-03 NOTE — Telephone Encounter (Signed)
done

## 2014-01-07 NOTE — Progress Notes (Signed)
Cc'd to pcp 

## 2014-01-15 ENCOUNTER — Telehealth (HOSPITAL_COMMUNITY): Payer: Self-pay | Admitting: *Deleted

## 2014-02-01 ENCOUNTER — Encounter: Payer: Self-pay | Admitting: Internal Medicine

## 2014-02-01 ENCOUNTER — Ambulatory Visit (INDEPENDENT_AMBULATORY_CARE_PROVIDER_SITE_OTHER): Payer: Medicare Other | Admitting: Internal Medicine

## 2014-02-01 ENCOUNTER — Encounter (INDEPENDENT_AMBULATORY_CARE_PROVIDER_SITE_OTHER): Payer: Self-pay

## 2014-02-01 VITALS — BP 132/82 | HR 69 | Temp 97.6°F | Ht 70.0 in | Wt 175.0 lb

## 2014-02-01 DIAGNOSIS — K3184 Gastroparesis: Secondary | ICD-10-CM | POA: Diagnosis not present

## 2014-02-01 DIAGNOSIS — K602 Anal fissure, unspecified: Secondary | ICD-10-CM

## 2014-02-01 NOTE — Progress Notes (Signed)
Primary Care Physician:  Sallee Lange, MD Primary Gastroenterologist:  Dr. [name]  Pre-Procedure History & Physical: HPI:  [name] is a 58 y.o. male here for followup of GERD and gastrioparesis. Had some anorectal irritation bleeding status post banding. 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 Did not get nitroglycerin ointment prescription. Said too expensive. Symptoms have settled down. He's taking regular fiber. Symptoms flare when he gets hot and sweaty. No nausea or vomiting.  Weight stable at 175. Takes Reglan and Phenergan occasionally but doesn't think he needs very often.  No side effects apparent.  Past Medical History  Diagnosis Date  . ASCVD (arteriosclerotic cardiovascular disease)     inferior MI with circumflex PTCA in 1993;cath in 2000-50% OM 1&2,30% LAD ;2008-25% LAD, 80% nondominant right ,60% small OM 2,EF of 55% with severe basilar inferior hypokinesis; stress nuclear study in 04/2008 moderate inferior scar with peri-infarction ischemic  . Syncope   . Hyperlipidemia   . Hypertension   . Tobacco abuse     50 pack years continuing at one halp pack daily  . COPD (chronic obstructive pulmonary disease)     mild ;excercise induced hypoxemia by cp stress test ;asthma ,bronchitis,  . Restless leg syndrome   . Anxiety and depression   . GERD (gastroesophageal reflux disease)   . Inflammatory polyps of colon with rectal bleeding   . Carcinoma in situ of colon 2004    rectal polyp  . H. pylori infection 2004    treated  . Sleep apnea     does not use CPAP  . Gastritis 12/30/10    EGD Dr [name]  . Colitis, ischemic 2011  . Tubular adenoma of colon 06/2009    Colonosocpy Dr [name]  . Leukocytosis     [name]  . Depression   . Anxiety   . Lung nodule 07/16/2011  . CAD (coronary artery disease)   . Diverticulosis   . Hemorrhoids   . Asthma     Past Surgical History  Procedure Laterality Date  . Colonoscopy w/ polypectomy  2004    rectal  polyp with carcinoma in situ removed via colonoscopy  . Hand surgery      surgical intervention for injury of the fingers of the left hand many years ago  . Colonoscopy  06/2009    normal terminal ileum, segmental mild inflammation of sigmoid colon (bx unremarkable), polyp, tubular adenoma  . Esophagogastroduodenoscopy  02/2009    query Barrett's but bx negative  . Esophagogastroduodenoscopy  12/30/2010    [name] [place] Gamero,gastritis, dilated 88F, sm HH, 1 small ulcer, Duodenal erosions, benign bx  . Flexible sigmoidoscopy  12/30/2010     [name] Paxton Kanaan,; internal hemorrhoids, anal papilla  . Heart stent    . Nasal septoplasty w/ turbinoplasty  10/06/2011    Procedure: NASAL SEPTOPLASTY WITH TURBINATE REDUCTION;  Surgeon: Izora Gala, MD;  Location: [place] [facility];  Service: ENT;  Laterality: Bilateral;  . Cardiac catheterization    . Coronary angioplasty with stent placement  01/26/2012    "1; total is now 2"  . Esophagogastroduodenoscopy (egd) with esophageal dilation N/A 09/12/2012    [id number] Schatzki's ring s/p Maloney dilator. Small hiatal hernia. negative path  . Cholecystectomy  2004  . Colonoscopy N/A 07/31/2013    Dr.Brinn Westby- redundant anal canal hemorrhoids, colonic diverticulosis, tubular adenoma  . Esophagogastroduodenoscopy (egd) with esophageal dilation N/A 07/31/2013    Dr. [name]- normal egd, s/p [facility] dilation empirically. Normal small bowel biopsies   .  Hemorrhoid banding      Dr. [name]    Prior to Admission medications   Medication Sig Start Date End Date Taking? Authorizing Provider  albuterol (PROVENTIL) (2.5 MG/3ML) 0.083% nebulizer solution Take 2.5 mg by nebulization every 6 (six) hours as needed. For shortness of breath   Yes Historical Provider, MD  albuterol-ipratropium (COMBIVENT) 18-103 MCG/ACT inhaler Inhale 2 puffs into the lungs every 6 (six) hours as needed. For shortness of breath   Yes Historical Provider, MD  ALPRAZolam (XANAX) 1 MG tablet Take 1 tablet (1 mg  total) by mouth 3 (three) times daily as needed for anxiety. 11/27/13  Yes [name], MD  aspirin EC 81 MG tablet Take 81 mg by mouth daily.    Yes Historical Provider, MD  atorvastatin (LIPITOR) 40 MG tablet Take 1 tablet (40 mg total) by mouth daily. 11/06/13  Yes [name], MD  budesonide-formoterol (SYMBICORT) 80-4.5 MCG/ACT inhaler Inhale 2 puffs into the lungs 2 (two) times daily. 07/26/13  Yes [name], MD  dronabinol (MARINOL) 2.5 MG capsule Take 1 capsule (2.5 mg total) by mouth 2 (two) times daily. 12/27/13  Yes Orvil Feil, NP  famotidine (PEPCID) 20 MG tablet Take 1 tablet (20 mg total) by mouth daily. 04/12/13  Yes Lendon Colonel, NP  FLUoxetine (PROZAC) 40 MG capsule Take 1 capsule (40 mg total) by mouth daily. 11/27/13 11/27/14 Yes [name], MD  gabapentin (NEURONTIN) 300 MG capsule Take 2 capsules (600 mg total) by mouth 3 (three) times daily. 01/03/14  Yes [name], MD  HYDROcodone-acetaminophen ([place]) 7.5-325 MG per tablet Take 1 tablet by mouth 3 (three) times daily as needed for moderate pain. 12/19/13  Yes [name], MD  hydrocortisone (ANUSOL-HC) 2.5 % rectal cream Place 1 application rectally 2 (two) times daily. 07/23/13  Yes Orvil Feil, NP  metoCLOPramide (REGLAN) 5 MG tablet Take 1 tablet (5 mg total) by mouth 4 (four) times daily -  before meals and at bedtime. 12/12/13  Yes Orvil Feil, NP  metoprolol succinate (TOPROL-XL) 25 MG 24 hr tablet TAKE 1 TABLET BY MOUTH EVERY DAY 07/11/13  Yes Lendon Colonel, NP  OLANZapine (ZYPREXA) 10 MG tablet Take 1 tablet (10 mg total) by mouth at bedtime. 11/27/13 11/27/14 Yes [name], MD  ondansetron (ZOFRAN) 4 MG tablet Take 1 tablet (4 mg total) by mouth every 8 (eight) hours as needed for nausea or vomiting. 11/29/13  Yes Orvil Feil, NP  pantoprazole (PROTONIX) 40 MG tablet Take 1 tablet (40 mg total) by mouth 2 (two) times daily before a meal. 11/29/13  Yes Orvil Feil, NP  rOPINIRole (REQUIP) 0.5 MG tablet  Take 0.5 mg by mouth 3 (three) times daily.   Yes Historical Provider, MD  verapamil (CALAN-SR) 180 MG CR tablet TAKE 1 TABLET EVERY DAY   Yes Lendon Colonel, NP  vitamin B-12 (CYANOCOBALAMIN) 1000 MCG tablet Take 2,000 mcg by mouth daily.   Yes Historical Provider, MD  Nitroglycerin 0.4 % OINT Place 1 inch rectally 2 (two) times daily. For 14 days 12/27/13   Orvil Feil, NP  promethazine (PHENERGAN) 12.5 MG tablet Take 1 tablet (12.5 mg total) by mouth every 6 (six) hours as needed for nausea or vomiting. 12/12/13   Orvil Feil, NP    Allergies as of 02/01/2014  . (No Known Allergies)    Family History  Problem Relation Age of Onset  . Lung disease Father  deceased, black lung  . Heart disease Mother     blood clots  . Depression Mother   . Cancer Paternal Uncle     unknown type  . Colon cancer Neg Hx   . ADD / ADHD Neg Hx   . Alcohol abuse Neg Hx   . Drug abuse Neg Hx   . Anxiety disorder Neg Hx   . Bipolar disorder Neg Hx   . Dementia Neg Hx   . OCD Neg Hx   . Paranoid behavior Neg Hx   . Schizophrenia Neg Hx   . Physical abuse Neg Hx   . Sexual abuse Neg Hx   . Seizures Neg Hx   . Cancer Maternal Aunt     unknown type  . Kidney failure Maternal Uncle   . Hypertension Brother   . Colon cancer Neg Hx     History   Social History  . Marital Status: Married    Spouse Name: N/A    Number of Children: 3  . Years of Education: N/A   Occupational History  . disable     [employer]   Social History Main Topics  . Smoking status: Former Smoker -- 0.50 packs/day for 30 years    Types: Cigarettes  . Smokeless tobacco: Never Used     Comment: now using electronic cigarettes  . Alcohol Use: No     Comment: Drinks a beer occasionally  . Drug Use: No  . Sexual Activity: Not on file   Other Topics Concern  . Not on file   Social History Narrative   3 stepchildren    Review of Systems: See HPI, otherwise negative ROS  Physical Exam: BP 132/82  Pulse 69   Temp(Src) 97.6 F (36.4 C) (Oral)  Ht 5\' 10"  (1.778 m)  Wt 175 lb (79.379 kg)  BMI 25.11 kg/m2 General:   Alert,  Well-developed, well-nourished, pleasant and cooperative in NAD Skin:  Intact without significant lesions or rashes. Eyes:  Sclera clear, no icterus.   Conjunctiva pink. Ears:  Normal auditory acuity. Nose:  No deformity, discharge,  or lesions. Mouth:  No deformity or lesions. Neck:  Supple; no masses or thyromegaly. No significant cervical adenopathy. Lungs:  Clear throughout to auscultation.   No wheezes, crackles, or rhonchi. No acute distress. Heart:  Regular rate and rhythm; no murmurs, clicks, rubs,  or gallops. Abdomen: Nondistended.   Positive bowel sounds. No succussion splash. Abdomen is soft and nontender.  Pulses:  Normal pulses noted. Extremities:  Without clubbing or edema.  Impression/Plan:  GERD/gastroparesis doing fairly well on Marinol when necessary Zofran and twice a day Protonix. I feel it would be best to come off of other agents entirely (i.e. Phenergan and Reglan). 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   Recommendations:  Stop taking Reglan entirely  Stop taking Phenergan  Continue Protonix, marinol and as needed Zofran  Continue gastroparesis diet (need info)  May need to revisit topical nitroglycerin if recurrent symptoms.  Office visit in 6 months     Notice: This dictation was prepared with Dragon dictation along with smaller phrase technology. Any transcriptional errors that result from this process are unintentional and may not be corrected upon review.

## 2014-02-01 NOTE — Patient Instructions (Signed)
Stop taking Reglan entirely  Stop taking Phenergan  Continue Protonix, marinol and as needed Zofran  Continue gastroparesis diet (need info)  Office visit in 6 months

## 2014-02-14 ENCOUNTER — Other Ambulatory Visit: Payer: Self-pay | Admitting: *Deleted

## 2014-02-17 ENCOUNTER — Other Ambulatory Visit: Payer: Self-pay | Admitting: Adult Health

## 2014-02-27 ENCOUNTER — Encounter (HOSPITAL_COMMUNITY): Payer: Self-pay | Admitting: Psychiatry

## 2014-02-27 ENCOUNTER — Ambulatory Visit (INDEPENDENT_AMBULATORY_CARE_PROVIDER_SITE_OTHER): Payer: BC Managed Care – PPO | Admitting: Psychiatry

## 2014-02-27 VITALS — BP 133/82 | HR 62 | Ht 70.0 in | Wt 172.8 lb

## 2014-02-27 DIAGNOSIS — F17200 Nicotine dependence, unspecified, uncomplicated: Secondary | ICD-10-CM

## 2014-02-27 DIAGNOSIS — F332 Major depressive disorder, recurrent severe without psychotic features: Secondary | ICD-10-CM

## 2014-02-27 DIAGNOSIS — F418 Other specified anxiety disorders: Secondary | ICD-10-CM

## 2014-02-27 DIAGNOSIS — F411 Generalized anxiety disorder: Secondary | ICD-10-CM

## 2014-02-27 DIAGNOSIS — F5105 Insomnia due to other mental disorder: Secondary | ICD-10-CM

## 2014-02-27 MED ORDER — GABAPENTIN 300 MG PO CAPS: 600.0000 mg | ORAL_CAPSULE | Freq: Three times a day (TID) | ORAL | Status: DC

## 2014-02-27 MED ORDER — ALPRAZOLAM 1 MG PO TABS: 1.0000 mg | ORAL_TABLET | Freq: Three times a day (TID) | ORAL | Status: DC | PRN

## 2014-02-27 MED ORDER — OLANZAPINE 10 MG PO TABS: 10.0000 mg | ORAL_TABLET | Freq: Every day | ORAL | Status: DC

## 2014-02-27 MED ORDER — FLUOXETINE HCL 40 MG PO CAPS: 40.0000 mg | ORAL_CAPSULE | Freq: Every day | ORAL | Status: DC

## 2014-02-27 NOTE — Progress Notes (Signed)
Patient ID: [name], male   DOB: 03-Mar-1956, 58 y.o.   MRN: [id number] Patient ID: [name], male   DOB: 1955/10/23, 58 y.o.   MRN: [id number] Patient ID: [name], male   DOB: 10/07/1955, 58 y.o.   MRN: [id number] Patient ID: [name], male   DOB: 10-24-1955, 58 y.o.   MRN: [id number] Patient ID: [name], male   DOB: 09-09-1955, 58 y.o.   MRN: [id number] Patient ID: [name], male   DOB: 1956/01/17, 58 y.o.   MRN: [id number] [place] [id number] Progress Note [name] MRN: [id number] DOB: 06/23/1955 Age: 59 y.o.  Date: 02/27/2014 Start Time: 9:50 AM End Time: 10:05 AM  Chief Complaint: Chief Complaint  Patient presents with  . Anxiety  . Depression  . Follow-up    Subjective: "I'm doing a little better"  This patient is a 58 year old married white male lives with his wife in [place]. He is on disability for coronary artery disease and COPD. He used to work for the [employer] but has not worked for the last 6 years. He has 3 step children 1 adopted child and several grandchildren.  The patient states he's been depressed and anxious primarily since he stopped working 6 years ago. He's also been through several deaths in his family his brother died suddenly 4 years ago. Following that his sister-in-law died in his home 8 months after his brother died. The patient's wife was caring for his wife's mother and she died last month at the age of 23. All of these things are disturbing to him. He's had trouble sleeping because he is thinking about the deaths. He's more depressed but not suicidal. He's not eating well and has lost about 20 pounds. He also has restless legs and chronic pain in his legs which keep him awake at night. He enjoys hunting and fishing but is less these interests recently.  The patient returns after 3 months. He's been doing a little better with his gastroparesis and has started to gain some weight. For the most part his mood is stable but he  still has bad days when he thinks about all the people in the family who have died. He and his wife are the primary caretakers with a 84-year-old granddaughter because her daughter is in and out of jail as is the baby's father. This is stressful but he wants to do right by the baby. He denies any suicidal ideation and feels like his medications have been helpful .  Diagnosis:   Axis I: Anxiety Disorder NOS and Major Depression, Recurrent severe Axis II: Deferred Axis III:  Past Medical History  Diagnosis Date  . ASCVD (arteriosclerotic cardiovascular disease)     inferior MI with circumflex PTCA in 1993;cath in 2000-50% OM 1&2,30% LAD ;2008-25% LAD, 80% nondominant right ,60% small OM 2,EF of 55% with severe basilar inferior hypokinesis; stress nuclear study in 04/2008 moderate inferior scar with peri-infarction ischemic  . Syncope   . Hyperlipidemia   . Hypertension   . Tobacco abuse     50 pack years continuing at one halp pack daily  . COPD (chronic obstructive pulmonary disease)     mild ;excercise induced hypoxemia by cp stress test ;asthma ,bronchitis,  . Restless leg syndrome   . Anxiety and depression   . GERD (gastroesophageal reflux disease)   . Inflammatory polyps of colon with rectal bleeding   . Carcinoma in situ of colon 2004    rectal  polyp  . H. pylori infection 2004    treated  . Sleep apnea     does not use CPAP  . Gastritis 12/30/10    EGD Dr [name]  . Colitis, ischemic 2011  . Tubular adenoma of colon 06/2009    Colonosocpy Dr [name]  . Leukocytosis     [name]  . Depression   . Anxiety   . Lung nodule 07/16/2011  . CAD (coronary artery disease)   . Diverticulosis   . Hemorrhoids   . Asthma    Axis IV: other psychosocial or environmental problems Axis V: 41-50 serious symptoms  ADL's:  Intact  Sleep: Fair  Appetite:  Poor  Allergies: No Known Allergies Medical History: Past Medical History  Diagnosis Date  . ASCVD (arteriosclerotic  cardiovascular disease)     inferior MI with circumflex PTCA in 1993;cath in 2000-50% OM 1&2,30% LAD ;2008-25% LAD, 80% nondominant right ,60% small OM 2,EF of 55% with severe basilar inferior hypokinesis; stress nuclear study in 04/2008 moderate inferior scar with peri-infarction ischemic  . Syncope   . Hyperlipidemia   . Hypertension   . Tobacco abuse     50 pack years continuing at one halp pack daily  . COPD (chronic obstructive pulmonary disease)     mild ;excercise induced hypoxemia by cp stress test ;asthma ,bronchitis,  . Restless leg syndrome   . Anxiety and depression   . GERD (gastroesophageal reflux disease)   . Inflammatory polyps of colon with rectal bleeding   . Carcinoma in situ of colon 2004    rectal polyp  . H. pylori infection 2004    treated  . Sleep apnea     does not use CPAP  . Gastritis 12/30/10    EGD Dr [name]  . Colitis, ischemic 2011  . Tubular adenoma of colon 06/2009    Colonosocpy Dr [name]  . Leukocytosis     [name]  . Depression   . Anxiety   . Lung nodule 07/16/2011  . CAD (coronary artery disease)   . Diverticulosis   . Hemorrhoids   . Asthma    Surgical History: Past Surgical History  Procedure Laterality Date  . Colonoscopy w/ polypectomy  2004    rectal polyp with carcinoma in situ removed via colonoscopy  . Hand surgery      surgical intervention for injury of the fingers of the left hand many years ago  . Colonoscopy  06/2009    normal terminal ileum, segmental mild inflammation of sigmoid colon (bx unremarkable), polyp, tubular adenoma  . Esophagogastroduodenoscopy  02/2009    query Barrett's but bx negative  . Esophagogastroduodenoscopy  12/30/2010    [name],gastritis, dilated 12F, sm HH, 1 small ulcer, Duodenal erosions, benign bx  . Flexible sigmoidoscopy  12/30/2010     [name],; internal hemorrhoids, anal papilla  . Heart stent    . Nasal septoplasty w/ turbinoplasty  10/06/2011    Procedure: NASAL  SEPTOPLASTY WITH TURBINATE REDUCTION;  Surgeon: Izora Gala, MD;  Location: [facility];  Service: ENT;  Laterality: Bilateral;  . Cardiac catheterization    . Coronary angioplasty with stent placement  01/26/2012    "1; total is now 2"  . Esophagogastroduodenoscopy (egd) with esophageal dilation N/A 09/12/2012    [id number] Schatzki's ring s/p Maloney dilator. Small hiatal hernia. negative path  . Cholecystectomy  2004  . Colonoscopy N/A 07/31/2013    Dr.Rourk- redundant anal canal hemorrhoids, colonic diverticulosis, tubular adenoma  . Esophagogastroduodenoscopy (  egd) with esophageal dilation N/A 07/31/2013    Dr. [name]- normal egd, s/p [facility] dilation empirically. Normal small bowel biopsies   . Hemorrhoid banding      Dr. [name]   Family History: family history includes Cancer in his maternal aunt and paternal uncle; Depression in his mother; Heart disease in his mother; Hypertension in his brother; Kidney failure in his maternal uncle; Lung disease in his father. There is no history of Colon cancer, ADD / ADHD, Alcohol abuse, Drug abuse, Anxiety disorder, Bipolar disorder, Dementia, OCD, Paranoid behavior, Schizophrenia, Physical abuse, Sexual abuse, Seizures, or Colon cancer. Reviewed and nothing new today.  Suicidal Ideation:  Pt denies Homicidal Ideation:  Pt has some struggles with his daughter's boy friend.  He stays away from him. AEB (as evidenced by):per pt report  Psychiatric Specialty Exam: ROS Neuro: no headaches, ataxia, weakness, gives out easily GI: no N/V/cramps/constipation, he keeps diarrhea for the past 6 or 7 years MS: no weakness or aches. He catches cramps really bad.   Blood pressure 133/82, pulse 62, height 5\' 10"  (1.778 m), weight 172 lb 12.8 oz (78.382 kg).Body mass index is 24.79 kg/(m^2).  General Appearance: Casual  Eye Contact::  Fair  Speech:  Clear and Coherent  Volume:  Normal  Mood:  Anxious , slightly dysphoric   Affect:  Congruent  Thought  Process:  Coherent, Linear and Logical  Orientation:  Full (Time, Place, and Person)  Thought Content:  Within normal limits   Suicidal Thoughts:  no  Homicidal Thoughts:  No  Memory:  Immediate;   Fair Recent;   Fair Remote;   Fair  Judgement:  Fair  Insight:  Fair  Psychomotor Activity:  Normal  Concentration:  Fair  Recall:  Fair  Akathisia:  No  Handed:  Right  AIMS (if indicated):     Assets:  Communication Skills Desire for Improvement  Sleep:      Current Medications: Zyprexa 5 mg at bed time Xanax 1 mg TID Neurontin 600 mg 3 times a day Prozac 40 mg per day Lab Results: No results found for this or any previous visit (from the past 48 hour(s)). Cardiologist draws periodic labs.  He has not heard back on the last set.  They usually tell him if there are concerns.  Physical Findings: AIMS:  , ,  ,  ,    CIWA:    COWS:     Treatment Plan Summary: Medication management  Plan: I took his vitals.  I reviewed CC, tobacco/med/surg Hx, meds effects/ side effects, problem list, therapies and responses as well as current situation/symptoms discussed options. Continue current medicationsHe'll return to see me in 3 months but call if symptoms worsen before that See orders and pt instructions for more details.  MEDICATIONS this encounter: Meds ordered this encounter  Medications  . hydrocortisone (ANUSOL-HC) 2.5 % rectal cream    Sig: Place 1 application rectally as needed.  Marland Kitchen FLUoxetine (PROZAC) 40 MG capsule    Sig: Take 1 capsule (40 mg total) by mouth daily.    Dispense:  30 capsule    Refill:  2  . OLANZapine (ZYPREXA) 10 MG tablet    Sig: Take 1 tablet (10 mg total) by mouth at bedtime.    Dispense:  30 tablet    Refill:  2  . gabapentin (NEURONTIN) 300 MG capsule    Sig: Take 2 capsules (600 mg total) by mouth 3 (three) times daily.    Dispense:  180 capsule  Refill:  2  . ALPRAZolam (XANAX) 1 MG tablet    Sig: Take 1 tablet (1 mg total) by mouth 3  (three) times daily as needed for anxiety.    Dispense:  90 tablet    Refill:  2    Medical Decision Making Problem Points:  Established problem, stable/improving (1), Review of last therapy session (1) and Review of psycho-social stressors (1) Data Points:  Review or order clinical lab tests (1) Review of medication regiment & side effects (2)  I certify that outpatient services furnished can reasonably be expected to improve the patient's condition.   ROSS, [facility] 02/27/2014, 8:47 AM

## 2014-03-11 ENCOUNTER — Encounter: Payer: Self-pay | Admitting: Cardiology

## 2014-03-11 ENCOUNTER — Ambulatory Visit (INDEPENDENT_AMBULATORY_CARE_PROVIDER_SITE_OTHER): Payer: Medicare Other | Admitting: Cardiology

## 2014-03-11 VITALS — BP 136/82 | HR 68 | Ht 70.0 in | Wt 176.0 lb

## 2014-03-11 DIAGNOSIS — E785 Hyperlipidemia, unspecified: Secondary | ICD-10-CM | POA: Diagnosis not present

## 2014-03-11 DIAGNOSIS — I10 Essential (primary) hypertension: Secondary | ICD-10-CM | POA: Diagnosis not present

## 2014-03-11 DIAGNOSIS — G4733 Obstructive sleep apnea (adult) (pediatric): Secondary | ICD-10-CM | POA: Diagnosis not present

## 2014-03-11 DIAGNOSIS — I2510 Atherosclerotic heart disease of native coronary artery without angina pectoris: Secondary | ICD-10-CM

## 2014-03-11 DIAGNOSIS — F17200 Nicotine dependence, unspecified, uncomplicated: Secondary | ICD-10-CM | POA: Diagnosis not present

## 2014-03-11 MED ORDER — ATORVASTATIN CALCIUM 80 MG PO TABS
80.0000 mg | ORAL_TABLET | Freq: Every day | ORAL | Status: DC
Start: 2014-03-11 — End: 2015-04-11

## 2014-03-11 NOTE — Progress Notes (Signed)
Clinical Summary [name] is a 58 y.o.male last seen by NP Purcell Nails, this is our first visit together. He is seen for the following medical problems.    1. 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 LVEF 50-55% by LV gram.  08/2011 echo LVEF 71%, grade I diastolic dysfunction.    - denies any chest pain. Can have some DOE at 1 block which is stable. No LE edema - compliant with meds   2. Hyperlipidemia - compliant with statin  3. HTN - compliant with meds, occasional lightheadedness with standing.   4. COPD - followed by pcp  5. OSA - does not use CPAP machine due to discomfort  6. Tobacco - significant has cut down on cigarettes, weaning with e-cig    Past Medical History  Diagnosis Date  . ASCVD (arteriosclerotic cardiovascular disease)     inferior MI with circumflex PTCA in 1993;cath in 2000-50% OM 1&2,30% LAD ;2008-25% LAD, 80% nondominant right ,60% small OM 2,EF of 55% with severe basilar inferior hypokinesis; stress nuclear study in 04/2008 moderate inferior scar with peri-infarction ischemic  . Syncope   . Hyperlipidemia   . Hypertension   . Tobacco abuse     50 pack years continuing at one halp pack daily  . COPD (chronic obstructive pulmonary disease)     mild ;excercise induced hypoxemia by cp stress test ;asthma ,bronchitis,  . Restless leg syndrome   . Anxiety and depression   . GERD (gastroesophageal reflux disease)   . Inflammatory polyps of colon with rectal bleeding   . Carcinoma in situ of colon 2004    rectal polyp  . H. pylori infection 2004    treated  . Sleep apnea     does not use CPAP  . Gastritis 12/30/10    EGD Dr [name]  . Colitis, ischemic 2011  . Tubular adenoma of colon 06/2009    Colonosocpy Dr [name]  . Leukocytosis     [name]  . Depression   . Anxiety   . Lung  nodule 07/16/2011  . CAD (coronary artery disease)   . Diverticulosis   . Hemorrhoids   . Asthma      No Known Allergies   Current Outpatient Prescriptions  Medication Sig Dispense Refill  . albuterol (PROVENTIL) (2.5 MG/3ML) 0.083% nebulizer solution Take 2.5 mg by nebulization every 6 (six) hours as needed. For shortness of breath      . albuterol-ipratropium (COMBIVENT) 18-103 MCG/ACT inhaler Inhale 2 puffs into the lungs every 6 (six) hours as needed. For shortness of breath      . ALPRAZolam (XANAX) 1 MG tablet Take 1 tablet (1 mg total) by mouth 3 (three) times daily as needed for anxiety.  90 tablet  2  . aspirin EC 81 MG tablet Take 81 mg by mouth daily.       Marland Kitchen atorvastatin (LIPITOR) 40 MG tablet Take 1 tablet (40 mg total) by mouth daily.  30 tablet  5  . budesonide-formoterol (SYMBICORT) 80-4.5 MCG/ACT inhaler Inhale 2 puffs into the lungs 2 (two) times daily.  1 Inhaler  12  . dronabinol (MARINOL) 2.5 MG capsule Take 1 capsule (2.5 mg total) by mouth 2 (two) times daily.  60 capsule  1  . famotidine (PEPCID) 20 MG tablet Take  1 tablet (20 mg total) by mouth daily.  30 tablet  3  . FLUoxetine (PROZAC) 40 MG capsule Take 1 capsule (40 mg total) by mouth daily.  30 capsule  2  . gabapentin (NEURONTIN) 300 MG capsule Take 2 capsules (600 mg total) by mouth 3 (three) times daily.  180 capsule  2  . HYDROcodone-acetaminophen (NORCO) 7.5-325 MG per tablet Take 1 tablet by mouth 3 (three) times daily as needed for moderate pain.  90 tablet  0  . hydrocortisone (ANUSOL-HC) 2.5 % rectal cream Place 1 application rectally as needed.      . metoCLOPramide (REGLAN) 5 MG tablet Take 1 tablet (5 mg total) by mouth 4 (four) times daily -  before meals and at bedtime.  120 tablet  0  . metoprolol succinate (TOPROL-XL) 25 MG 24 hr tablet TAKE 1 TABLET BY MOUTH EVERY DAY  30 tablet  6  . Nitroglycerin 0.4 % OINT Place 1 inch rectally 2 (two) times daily. For 14 days  1 Tube  0  . OLANZapine  (ZYPREXA) 10 MG tablet Take 1 tablet (10 mg total) by mouth at bedtime.  30 tablet  2  . ondansetron (ZOFRAN) 4 MG tablet Take 1 tablet (4 mg total) by mouth every 8 (eight) hours as needed for nausea or vomiting.  90 tablet  1  . promethazine (PHENERGAN) 12.5 MG tablet Take 1 tablet (12.5 mg total) by mouth every 6 (six) hours as needed for nausea or vomiting.  30 tablet  0  . rOPINIRole (REQUIP) 0.5 MG tablet Take 0.5 mg by mouth 3 (three) times daily.      . verapamil (CALAN-SR) 180 MG CR tablet TAKE 1 TABLET EVERY DAY  30 tablet  10  . vitamin B-12 (CYANOCOBALAMIN) 1000 MCG tablet Take 2,000 mcg by mouth daily.      . [DISCONTINUED] amLODipine (NORVASC) 5 MG tablet Take 5 mg by mouth daily.        No current facility-administered medications for this visit.     Past Surgical History  Procedure Laterality Date  . Colonoscopy w/ polypectomy  2004    rectal polyp with carcinoma in situ removed via colonoscopy  . Hand surgery      surgical intervention for injury of the fingers of the left hand many years ago  . Colonoscopy  06/2009    normal terminal ileum, segmental mild inflammation of sigmoid colon (bx unremarkable), polyp, tubular adenoma  . Esophagogastroduodenoscopy  02/2009    query Barrett's but bx negative  . Esophagogastroduodenoscopy  12/30/2010    [name],gastritis, dilated 94F, sm HH, 1 small ulcer, Duodenal erosions, benign bx  . Flexible sigmoidoscopy  12/30/2010     [name],; internal hemorrhoids, anal papilla  . Heart stent    . Nasal septoplasty w/ turbinoplasty  10/06/2011    Procedure: NASAL SEPTOPLASTY WITH TURBINATE REDUCTION;  Surgeon: Izora Gala, MD;  Location: [facility];  Service: ENT;  Laterality: Bilateral;  . Cardiac catheterization    . Coronary angioplasty with stent placement  01/26/2012    "1; total is now 2"  . Esophagogastroduodenoscopy (egd) with esophageal dilation N/A 09/12/2012    [id number] Schatzki's ring s/p Maloney dilator. Small  hiatal hernia. negative path  . Cholecystectomy  2004  . Colonoscopy N/A 07/31/2013    Dr.Rourk- redundant anal canal hemorrhoids, colonic diverticulosis, tubular adenoma  . Esophagogastroduodenoscopy (egd) with esophageal dilation N/A 07/31/2013    Dr. [name]- normal egd, s/p [facility] dilation empirically. Normal  small bowel biopsies   . Hemorrhoid banding      Dr. [name]     No Known Allergies    Family History  Problem Relation Age of Onset  . Lung disease Father     deceased, black lung  . Heart disease Mother     blood clots  . Depression Mother   . Cancer Paternal Uncle     unknown type  . Colon cancer Neg Hx   . ADD / ADHD Neg Hx   . Alcohol abuse Neg Hx   . Drug abuse Neg Hx   . Anxiety disorder Neg Hx   . Bipolar disorder Neg Hx   . Dementia Neg Hx   . OCD Neg Hx   . Paranoid behavior Neg Hx   . Schizophrenia Neg Hx   . Physical abuse Neg Hx   . Sexual abuse Neg Hx   . Seizures Neg Hx   . Cancer Maternal Aunt     unknown type  . Kidney failure Maternal Uncle   . Hypertension Brother   . Colon cancer Neg Hx      Social History [name] reports that he has quit smoking. His smoking use included Cigarettes. He has a 15 pack-year smoking history. He has never used smokeless tobacco. [name] reports that he does not drink alcohol.   Review of Systems CONSTITUTIONAL: No weight loss, fever, chills, weakness or fatigue.  HEENT: Eyes: No visual loss, blurred vision, double vision or yellow sclerae.No hearing loss, sneezing, congestion, runny nose or sore throat.  SKIN: No rash or itching.  CARDIOVASCULAR: per HPI RESPIRATORY: No cough or sputum.  GASTROINTESTINAL: No anorexia, nausea, vomiting or diarrhea. No abdominal pain or blood.  GENITOURINARY: No burning on urination, no polyuria NEUROLOGICAL: No headache, dizziness, syncope, paralysis, ataxia, numbness or tingling in the extremities. No change in bowel or bladder control.  MUSCULOSKELETAL: No muscle,  back pain, joint pain or stiffness.  LYMPHATICS: No enlarged nodes. No history of splenectomy.  PSYCHIATRIC: No history of depression or anxiety.  ENDOCRINOLOGIC: No reports of sweating, cold or heat intolerance. No polyuria or polydipsia.  Marland Kitchen   Physical Examination p 68 bp 136/82 Wt 176 lbs BMI 25 Gen: resting comfortably, no acute distress HEENT: no scleral icterus, pupils equal round and reactive, no palptable cervical adenopathy,  CV: RRR, no m/r/g, no JVD Resp: Clear to auscultation bilaterally GI: abdomen is soft, non-tender, non-distended, normal bowel sounds, no hepatosplenomegaly MSK: extremities are warm, no edema.  Skin: warm, no rash Neuro:  no focal deficits Psych: appropriate affect   Diagnostic Studies 02/2010 Cath DESCRIPTION OF PROCEDURE:  The risks and indication were explained. Consent was signed and placed on the chart.  A 4-French arterial sheath was placed in the right femoral artery using a modified Seldinger technique.  A standard catheters including a JL-4, 3DRC, and angled pigtail were used.  All catheter exchanges were made over wire.  There were no apparent complications.  Central aortic pressure 109/67 with a mean of 86.  LV pressure 110/70 with an EDP of 28.  There was no aortic stenosis.  Left main was normal.  LAD was a long vessel coursing to the apex, it gave off a moderate-sized diagonal branch.  There was a 40% lesion in the proximal LAD, otherwise minimal luminal irregularities.  Left circumflex was a large dominant vessel, gave off a narrow caliber ramus, large OM-1, and narrow caliber OM-2 in several posterolaterals. In the proximal left circumflex, he had  a 40% lesion which extended into the proximal portion of the OM-1.  There was a 30% lesion in the mid OM- 1.  In the OM-2, there was a long 80% stenosis throughout the ostial and proximal portion.  This vessel was very small caliber vessel.  Right coronary artery was a nondominant  vessel, gave off an RV branch. There was approximately 90% stenosis in the proximal portion.  Once again, this was a very small nondominant vessel.  Left ventriculogram done in the RAO position showed an EF of 50-55% with inferior basilar akinesis.  There was no significant mitral regurgitation.  Abdominal aortogram showed 40% ostial lesion in the right renal artery. The left renal artery was widely patent.  There was no abdominal aortic aneurysm.  On panning down over the iliac and femoral system, there was no high-grade lesions.  ASSESSMENT: 1. Stable coronary artery disease with high-grade lesions in the small     obtuse marginal 2 and right coronary artery both of which are too     small for percutaneous intervention. 2. Left ventricular ejection fraction was 50-55% with mildly elevated     filling pressures. 3. Very mild right renal artery stenosis. 4. No obvious high-grade proximal peripheral arterial disease.  PLAN/DISCUSSION:  I suspect most of his dyspnea is likely due to his lung disease.  I have urged him to stop smoking.  We will continue medical therapy now for his heart disease.   08/2011 Echo Study Conclusions  - Left ventricle: The cavity size was normal. There was mild focal basal hypertrophy of the septum. The estimated ejection fraction was 55%. There is akinesis of the basalinferior myocardium. Doppler parameters are consistent with abnormal left ventricular relaxation (grade 1 diastolic dysfunction). - Mitral valve: Trivial regurgitation. - Left atrium: The atrium was at the upper limits of normal in size. - Tricuspid valve: Trivial regurgitation. - Pulmonary arteries: PA peak pressure: 34mm Hg (S). - Pericardium, extracardiac: There was no pericardial effusion.     Assessment and Plan   1. CAD - no current symptoms - continue risk factor modification and secondary prevention  2. Hyperlipidemia - in setting of known CAD should be on high dose  statin, increase atorva to 80mg  daily  3. HTN - at goal on current meds, management primarily by pcp. Consider ACE-I given additional outcome benefits in CAD or amlodopine as a peripherally acting CCB as opposed to verapamil  4. COPD - per pcp  5. OSA - encouraged to use CPAP  6. Hyperlipidemia - change to high dose statin in setting of known CAD, change to lipitor 80mg  daily  7. Tobacco - encouarged complete cessation, emphasized e-cig is intended for weaning and not permanent alternative.   Arnoldo Lenis, M.D.

## 2014-03-11 NOTE — Patient Instructions (Signed)
Your physician wants you to follow-up in: 1 year You will receive a reminder letter in the mail two months in advance. If you don't receive a letter, please call our office to schedule the follow-up appointment.       Your physician has recommended you make the following change in your medication:      INCREASE Lipitor to 80 mg daily      Thank you for choosing [facility] !

## 2014-03-18 ENCOUNTER — Ambulatory Visit (INDEPENDENT_AMBULATORY_CARE_PROVIDER_SITE_OTHER): Payer: Medicare Other | Admitting: Family Medicine

## 2014-03-18 ENCOUNTER — Encounter: Payer: Self-pay | Admitting: Family Medicine

## 2014-03-18 VITALS — BP 134/90 | Ht 70.0 in | Wt 174.0 lb

## 2014-03-18 DIAGNOSIS — E785 Hyperlipidemia, unspecified: Secondary | ICD-10-CM

## 2014-03-18 DIAGNOSIS — I2510 Atherosclerotic heart disease of native coronary artery without angina pectoris: Secondary | ICD-10-CM | POA: Diagnosis not present

## 2014-03-18 DIAGNOSIS — Z125 Encounter for screening for malignant neoplasm of prostate: Secondary | ICD-10-CM

## 2014-03-18 DIAGNOSIS — I10 Essential (primary) hypertension: Secondary | ICD-10-CM

## 2014-03-18 DIAGNOSIS — E118 Type 2 diabetes mellitus with unspecified complications: Secondary | ICD-10-CM | POA: Diagnosis not present

## 2014-03-18 DIAGNOSIS — Z23 Encounter for immunization: Secondary | ICD-10-CM | POA: Diagnosis not present

## 2014-03-18 DIAGNOSIS — G894 Chronic pain syndrome: Secondary | ICD-10-CM | POA: Diagnosis not present

## 2014-03-18 DIAGNOSIS — G2581 Restless legs syndrome: Secondary | ICD-10-CM | POA: Diagnosis not present

## 2014-03-18 LAB — POCT GLYCOSYLATED HEMOGLOBIN (HGB A1C): HEMOGLOBIN A1C: 5

## 2014-03-18 MED ORDER — HYDROCODONE-ACETAMINOPHEN 7.5-325 MG PO TABS: 1.0000 | ORAL_TABLET | Freq: Three times a day (TID) | ORAL | Status: DC | PRN

## 2014-03-18 MED ORDER — LISINOPRIL 5 MG PO TABS: 5.0000 mg | ORAL_TABLET | Freq: Every day | ORAL | Status: DC

## 2014-03-18 MED ORDER — ROPINIROLE HCL 0.5 MG PO TABS: 0.5000 mg | ORAL_TABLET | Freq: Every day | ORAL | Status: DC

## 2014-03-18 NOTE — Progress Notes (Signed)
   Subjective:    Patient ID: [name], male    DOB: Jan 04, 1956, 58 y.o.   MRN: [id number]  HPI This patient was seen today for chronic pain  The medication list was reviewed and updated.   -Compliance with pain medication: yes  The patient was advised the importance of maintaining medication and not using illegal substances with these.  Refills needed: yes  The patient was educated that we can provide 3 monthly scripts for their medication, it is their responsibility to follow the instructions.  Side effects or complications from medications: none  Patient is aware that pain medications are meant to minimize the severity of the pain to allow their pain levels to improve to allow for better function. They are aware of that pain medications cannot totally remove their pain.  Due for UDT ( at least once per year) : Later this year  Needs refill on ropinirole. Pt has been out for awhile.  Flu vaccine today.  Hyperglycemia. A1C today  Notes from cardiologist was reviewed. Patient has been taking verapamil. He was recommended to switch to lisinopril because of this helping cardiac aspects  Patient does state he watches his sugars in his diet. He relates he is not smoking cigarettes but occasionally uses an electronic cigarettes he is trying to quit those totally  He states his restless legs father's medicine he would like those refilled   Review of Systems  Constitutional: Negative for activity change, appetite change and fatigue.  HENT: Negative for congestion.   Respiratory: Negative for cough.   Cardiovascular: Negative for chest pain.  Gastrointestinal: Negative for abdominal pain.  Endocrine: Negative for polydipsia and polyphagia.  Neurological: Negative for weakness.  Psychiatric/Behavioral: Negative for confusion.       Objective:   Physical Exam  Vitals reviewed. Constitutional: He appears well-nourished. No distress.  Cardiovascular: Normal rate, regular  rhythm and normal heart sounds.   No murmur heard. Pulmonary/Chest: Effort normal and breath sounds normal. No respiratory distress.  Musculoskeletal: He exhibits no edema.  Lymphadenopathy:    He has no cervical adenopathy.  Neurological: He is alert.  Psychiatric: His behavior is normal.          Assessment & Plan:  1. Type 2 diabetes mellitus with complication His B3A shows good control watch diet stay physically active - POCT glycosylated hemoglobin (Hb A1C)  2. RLS (restless legs syndrome) Refills were given regarding medication he states it helps and  3. Chronic pain syndrome He relates the pain medicine does help he denies abusing it helps take the edge off his back pain and joint discomforts allows him to function better  4. Essential hypertension I recommend that we stop verapamil. Start lisinopril. Check metabolic 7 level in approximately 8 weeks. - Hepatic function panel - Basic metabolic panel  5. Hyperlipidemia Continue cholesterol medicine watch diet check lipid liver profile 8 weeks - Lipid panel  6. Encounter for immunization Flu shot today.  7. Screening PSA (prostate specific antigen) PSA screening ordered - PSA

## 2014-05-20 ENCOUNTER — Telehealth: Payer: Self-pay

## 2014-05-20 NOTE — Telephone Encounter (Signed)
PA has been done for pantoprazole bid and approved until 05/16/15. Informed pharmacy

## 2014-05-23 ENCOUNTER — Encounter (HOSPITAL_COMMUNITY): Payer: Self-pay | Admitting: Cardiology

## 2014-05-29 ENCOUNTER — Ambulatory Visit (INDEPENDENT_AMBULATORY_CARE_PROVIDER_SITE_OTHER): Payer: Medicare Other | Admitting: Psychiatry

## 2014-05-29 ENCOUNTER — Encounter (HOSPITAL_COMMUNITY): Payer: Self-pay | Admitting: Psychiatry

## 2014-05-29 ENCOUNTER — Ambulatory Visit (HOSPITAL_COMMUNITY): Payer: Self-pay | Admitting: Psychiatry

## 2014-05-29 VITALS — BP 132/93 | HR 73 | Ht 70.0 in | Wt 176.2 lb

## 2014-05-29 DIAGNOSIS — F419 Anxiety disorder, unspecified: Secondary | ICD-10-CM | POA: Diagnosis not present

## 2014-05-29 DIAGNOSIS — F5105 Insomnia due to other mental disorder: Secondary | ICD-10-CM

## 2014-05-29 DIAGNOSIS — F332 Major depressive disorder, recurrent severe without psychotic features: Secondary | ICD-10-CM

## 2014-05-29 DIAGNOSIS — F418 Other specified anxiety disorders: Secondary | ICD-10-CM

## 2014-05-29 DIAGNOSIS — F17200 Nicotine dependence, unspecified, uncomplicated: Secondary | ICD-10-CM

## 2014-05-29 MED ORDER — GABAPENTIN 300 MG PO CAPS: 600.0000 mg | ORAL_CAPSULE | Freq: Three times a day (TID) | ORAL | Status: DC

## 2014-05-29 MED ORDER — FLUOXETINE HCL 40 MG PO CAPS: 40.0000 mg | ORAL_CAPSULE | Freq: Every day | ORAL | Status: DC

## 2014-05-29 MED ORDER — ALPRAZOLAM 1 MG PO TABS: 1.0000 mg | ORAL_TABLET | Freq: Four times a day (QID) | ORAL | Status: DC

## 2014-05-29 MED ORDER — OLANZAPINE 10 MG PO TABS: 10.0000 mg | ORAL_TABLET | Freq: Every day | ORAL | Status: DC

## 2014-05-29 NOTE — Progress Notes (Signed)
Patient ID: [name], male   DOB: 06-22-1955, 58 y.o.   MRN: [id number] Patient ID: [name], male   DOB: Nov 24, 1955, 58 y.o.   MRN: [id number] Patient ID: [name], male   DOB: 1955/12/24, 58 y.o.   MRN: [id number] Patient ID: [name], male   DOB: 04/29/1956, 58 y.o.   MRN: [id number] Patient ID: [name], male   DOB: Feb 28, 1956, 58 y.o.   MRN: [id number] Patient ID: [name], male   DOB: Dec 16, 1955, 58 y.o.   MRN: [id number] Patient ID: [name], male   DOB: [birth year], 58 y.o.   MRN: [id number] [place] [id number] Progress Note [name] MRN: [id number] DOB: Aug 23, 1955 Age: 58 y.o.  Date: 05/29/2014 Start Time: 9:50 AM End Time: 10:05 AM  Chief Complaint: Chief Complaint  Patient presents with  . Depression  . Anxiety  . Follow-up    Subjective: "I'm doing a little better"  This patient is a 58 year old married white male lives with his wife in [place]. He is on disability for coronary artery disease and COPD. He used to work for the [employer] but has not worked for the last 6 years. He has 3 step children 1 adopted child and several grandchildren.  The patient states he's been depressed and anxious primarily since he stopped working 6 years ago. He's also been through several deaths in his family his brother died suddenly 4 years ago. Following that his sister-in-law died in his home 8 months after his brother died. The patient's wife was caring for his wife's mother and she died last month at the age of 12. All of these things are disturbing to him. He's had trouble sleeping because he is thinking about the deaths. He's more depressed but not suicidal. He's not eating well and has lost about 20 pounds. He also has restless legs and chronic pain in his legs which keep him awake at night. He enjoys hunting and fishing but is less these interests recently.  The patient returns after 3 months. He's been very stressed lately. He and his wife is found  out today that they're going get custody of their 25-year-old granddaughter. Both his daughter and the baby's father use drugs and aren't suitable parents at least for now. For about 5 weeks they didn't know exactly where the little girl was. He asked if he can have one more Xanax a day and I think this is reasonable given his current level of stress  Diagnosis:   Axis I: Anxiety Disorder NOS and Major Depression, Recurrent severe Axis II: Deferred Axis III:  Past Medical History  Diagnosis Date  . ASCVD (arteriosclerotic cardiovascular disease)     inferior MI with circumflex PTCA in 1993;cath in 2000-50% OM 1&2,30% LAD ;2008-25% LAD, 80% nondominant right ,60% small OM 2,EF of 55% with severe basilar inferior hypokinesis; stress nuclear study in 04/2008 moderate inferior scar with peri-infarction ischemic  . Syncope   . Hyperlipidemia   . Hypertension   . Tobacco abuse     50 pack years continuing at one halp pack daily  . COPD (chronic obstructive pulmonary disease)     mild ;excercise induced hypoxemia by cp stress test ;asthma ,bronchitis,  . Restless leg syndrome   . Anxiety and depression   . GERD (gastroesophageal reflux disease)   . Inflammatory polyps of colon with rectal bleeding   . Carcinoma in situ of colon 2004    rectal polyp  . H.  pylori infection 2004    treated  . Sleep apnea     does not use CPAP  . Gastritis 12/30/10    EGD Dr [name]  . Colitis, ischemic 2011  . Tubular adenoma of colon 06/2009    Colonosocpy Dr [name]  . Leukocytosis     [name]  . Depression   . Anxiety   . Lung nodule 07/16/2011  . CAD (coronary artery disease)   . Diverticulosis   . Hemorrhoids   . Asthma   . Schatzki's ring   . Hiatal hernia    Axis IV: other psychosocial or environmental problems Axis V: 41-50 serious symptoms  ADL's:  Intact  Sleep: Fair  Appetite:  Poor  Allergies: No Known Allergies Medical History: Past Medical History  Diagnosis Date  .  ASCVD (arteriosclerotic cardiovascular disease)     inferior MI with circumflex PTCA in 1993;cath in 2000-50% OM 1&2,30% LAD ;2008-25% LAD, 80% nondominant right ,60% small OM 2,EF of 55% with severe basilar inferior hypokinesis; stress nuclear study in 04/2008 moderate inferior scar with peri-infarction ischemic  . Syncope   . Hyperlipidemia   . Hypertension   . Tobacco abuse     50 pack years continuing at one halp pack daily  . COPD (chronic obstructive pulmonary disease)     mild ;excercise induced hypoxemia by cp stress test ;asthma ,bronchitis,  . Restless leg syndrome   . Anxiety and depression   . GERD (gastroesophageal reflux disease)   . Inflammatory polyps of colon with rectal bleeding   . Carcinoma in situ of colon 2004    rectal polyp  . H. pylori infection 2004    treated  . Sleep apnea     does not use CPAP  . Gastritis 12/30/10    EGD Dr [name]  . Colitis, ischemic 2011  . Tubular adenoma of colon 06/2009    Colonosocpy Dr [name]  . Leukocytosis     [name]  . Depression   . Anxiety   . Lung nodule 07/16/2011  . CAD (coronary artery disease)   . Diverticulosis   . Hemorrhoids   . Asthma   . Schatzki's ring   . Hiatal hernia    Surgical History: Past Surgical History  Procedure Laterality Date  . Colonoscopy w/ polypectomy  2004    rectal polyp with carcinoma in situ removed via colonoscopy  . Hand surgery      surgical intervention for injury of the fingers of the left hand many years ago  . Colonoscopy  06/2009    normal terminal ileum, segmental mild inflammation of sigmoid colon (bx unremarkable), polyp, tubular adenoma  . Esophagogastroduodenoscopy  02/2009    query Barrett's but bx negative  . Esophagogastroduodenoscopy  12/30/2010    [name],gastritis, dilated 81F, sm HH, 1 small ulcer, Duodenal erosions, benign bx  . Flexible sigmoidoscopy  12/30/2010     [name],; internal hemorrhoids, anal papilla  . Heart stent    . Nasal  septoplasty w/ turbinoplasty  10/06/2011    Procedure: NASAL SEPTOPLASTY WITH TURBINATE REDUCTION;  Surgeon: Izora Gala, MD;  Location: [facility];  Service: ENT;  Laterality: Bilateral;  . Cardiac catheterization    . Coronary angioplasty with stent placement  01/26/2012    "1; total is now 2"  . Esophagogastroduodenoscopy (egd) with esophageal dilation N/A 09/12/2012    [id number] Schatzki's ring s/p Maloney dilator. Small hiatal hernia. negative path  . Cholecystectomy  2004  . Colonoscopy N/A  07/31/2013    Dr.Rourk- redundant anal canal hemorrhoids, colonic diverticulosis, tubular adenoma  . Esophagogastroduodenoscopy (egd) with esophageal dilation N/A 07/31/2013    Dr. [name]- normal egd, s/p [facility] dilation empirically. Normal small bowel biopsies   . Hemorrhoid banding      Dr. [name]  . Left heart catheterization with coronary angiogram N/A 01/26/2012    Procedure: LEFT HEART CATHETERIZATION WITH CORONARY ANGIOGRAM;  Surgeon: [name], MD;  Location: [facility] CATH LAB;  Service: Cardiovascular;  Laterality: N/A;  . Percutaneous coronary stent intervention (pci-s) N/A 01/26/2012    Procedure: PERCUTANEOUS CORONARY STENT INTERVENTION (PCI-S);  Surgeon: [name], MD;  Location: [facility] CATH LAB;  Service: Cardiovascular;  Laterality: N/A;   Family History: family history includes Cancer in his maternal aunt and paternal uncle; Depression in his mother; Heart disease in his mother; Hypertension in his brother; Kidney failure in his maternal uncle; Lung disease in his father. There is no history of Colon cancer, ADD / ADHD, Alcohol abuse, Drug abuse, Anxiety disorder, Bipolar disorder, Dementia, OCD, Paranoid behavior, Schizophrenia, Physical abuse, Sexual abuse, Seizures, or Colon cancer. Reviewed and nothing new today.  Suicidal Ideation:  Pt denies Homicidal Ideation:  Pt has some struggles with his daughter's boy friend.  He stays away from him. AEB (as evidenced by):per pt  report  Psychiatric Specialty Exam: ROS Neuro: no headaches, ataxia, weakness, gives out easily GI: no N/V/cramps/constipation, he keeps diarrhea for the past 6 or 7 years MS: no weakness or aches. He catches cramps really bad.   Blood pressure 132/93, pulse 73, height 5\' 10"  (1.778 m), weight 176 lb 3.2 oz (79.924 kg).Body mass index is 25.28 kg/(m^2).  General Appearance: Casual  Eye Contact::  Fair  Speech:  Clear and Coherent  Volume:  Normal  Mood:  Anxious ,dysphoric   Affect:  Congruent  Thought Process:  Coherent, Linear and Logical  Orientation:  Full (Time, Place, and Person)  Thought Content:  Within normal limits   Suicidal Thoughts:  no  Homicidal Thoughts:  No  Memory:  Immediate;   Fair Recent;   Fair Remote;   Fair  Judgement:  Fair  Insight:  Fair  Psychomotor Activity:  Normal  Concentration:  Fair  Recall:  Fair  Akathisia:  No  Handed:  Right  AIMS (if indicated):     Assets:  Communication Skills Desire for Improvement  Sleep:      Current Medications: Zyprexa 10 mg at bed time Xanax 1 mg TID Neurontin 600 mg 3 times a day Prozac 40 mg per day Lab Results: No results found for this or any previous visit (from the past 48 hour(s)). Cardiologist draws periodic labs.  He has not heard back on the last set.  They usually tell him if there are concerns.  Physical Findings: AIMS:  , ,  ,  ,    CIWA:    COWS:     Treatment Plan Summary: Medication management  Plan: I took his vitals.  I reviewed CC, tobacco/med/surg Hx, meds effects/ side effects, problem list, therapies and responses as well as current situation/symptoms discussed options. Continue current medications but increase Xanax to 1 mg 4 times a day He'll return to see me in 3 months but call if symptoms worsen before that See orders and pt instructions for more details.  MEDICATIONS this encounter: Meds ordered this encounter  Medications  . OLANZapine (ZYPREXA) 10 MG tablet    Sig:  Take 1 tablet (10 mg total) by mouth at  bedtime.    Dispense:  30 tablet    Refill:  2  . FLUoxetine (PROZAC) 40 MG capsule    Sig: Take 1 capsule (40 mg total) by mouth daily.    Dispense:  30 capsule    Refill:  2  . gabapentin (NEURONTIN) 300 MG capsule    Sig: Take 2 capsules (600 mg total) by mouth 3 (three) times daily.    Dispense:  180 capsule    Refill:  2  . ALPRAZolam (XANAX) 1 MG tablet    Sig: Take 1 tablet (1 mg total) by mouth 4 (four) times daily.    Dispense:  120 tablet    Refill:  2    Medical Decision Making Problem Points:  Established problem, stable/improving (1), Review of last therapy session (1) and Review of psycho-social stressors (1) Data Points:  Review or order clinical lab tests (1) Review of medication regiment & side effects (2)  I certify that outpatient services furnished can reasonably be expected to improve the patient's condition.   ROSS, [facility] 05/29/2014, 4:46 PM

## 2014-06-10 ENCOUNTER — Encounter: Payer: Self-pay | Admitting: Internal Medicine

## 2014-06-20 DIAGNOSIS — E785 Hyperlipidemia, unspecified: Secondary | ICD-10-CM | POA: Diagnosis not present

## 2014-06-20 DIAGNOSIS — I10 Essential (primary) hypertension: Secondary | ICD-10-CM | POA: Diagnosis not present

## 2014-06-20 DIAGNOSIS — Z125 Encounter for screening for malignant neoplasm of prostate: Secondary | ICD-10-CM | POA: Diagnosis not present

## 2014-06-20 LAB — HEPATIC FUNCTION PANEL
ALBUMIN: 4 g/dL (ref 3.5–5.2)
ALT: 42 U/L (ref 0–53)
AST: 25 U/L (ref 0–37)
Alkaline Phosphatase: 81 U/L (ref 39–117)
Bilirubin, Direct: 0.1 mg/dL (ref 0.0–0.3)
Indirect Bilirubin: 0.5 mg/dL (ref 0.2–1.2)
TOTAL PROTEIN: 6.7 g/dL (ref 6.0–8.3)
Total Bilirubin: 0.6 mg/dL (ref 0.2–1.2)

## 2014-06-20 LAB — BASIC METABOLIC PANEL
BUN: 6 mg/dL (ref 6–23)
CO2: 28 meq/L (ref 19–32)
Calcium: 9 mg/dL (ref 8.4–10.5)
Chloride: 105 mEq/L (ref 96–112)
Creat: 0.72 mg/dL (ref 0.50–1.35)
Glucose, Bld: 110 mg/dL — ABNORMAL HIGH (ref 70–99)
Potassium: 4 mEq/L (ref 3.5–5.3)
Sodium: 142 mEq/L (ref 135–145)

## 2014-06-20 LAB — LIPID PANEL
CHOL/HDL RATIO: 5.6 ratio
Cholesterol: 197 mg/dL (ref 0–200)
HDL: 35 mg/dL — ABNORMAL LOW (ref >39)
LDL Cholesterol: 98 mg/dL (ref 0–99)
Triglycerides: 322 mg/dL — ABNORMAL HIGH (ref <150)
VLDL: 64 mg/dL — AB (ref 0–40)

## 2014-06-21 LAB — PSA: PSA: 0.76 ng/mL (ref <=4.00)

## 2014-06-22 ENCOUNTER — Encounter: Payer: Self-pay | Admitting: Family Medicine

## 2014-07-19 ENCOUNTER — Encounter: Payer: Self-pay | Admitting: Internal Medicine

## 2014-07-19 ENCOUNTER — Ambulatory Visit (INDEPENDENT_AMBULATORY_CARE_PROVIDER_SITE_OTHER): Payer: Medicare Other | Admitting: Internal Medicine

## 2014-07-19 VITALS — BP 126/80 | HR 70 | Temp 97.0°F | Ht 70.0 in | Wt 178.2 lb

## 2014-07-19 DIAGNOSIS — D126 Benign neoplasm of colon, unspecified: Secondary | ICD-10-CM

## 2014-07-19 DIAGNOSIS — K3184 Gastroparesis: Secondary | ICD-10-CM | POA: Diagnosis not present

## 2014-07-19 DIAGNOSIS — K219 Gastro-esophageal reflux disease without esophagitis: Secondary | ICD-10-CM | POA: Diagnosis not present

## 2014-07-19 MED ORDER — PANTOPRAZOLE SODIUM 40 MG PO TBEC: 40.0000 mg | DELAYED_RELEASE_TABLET | Freq: Two times a day (BID) | ORAL | Status: DC

## 2014-07-19 NOTE — Progress Notes (Signed)
Primary Care Physician:  Sallee Lange, MD Primary Gastroenterologist:  Dr. [name] Pre-Procedure History & Physical: HPI:  [name] is a 59 y.o. 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 He's done very well on Protonix 40 mg twice daily. He's really not having any nausea or vomiting; no longer on Reglan or Phenergan. He's gained 3 pounds since his last visit. History of high-grade adenomas previously removed.  Due for surveillance colonoscopy 2020. Patient denies alcohol or illicit drug use. No dysphagia since undergoing esophageal dilation one year ago.  He has gained 3 pounds since his last office visit with [place].  Past Medical History  Diagnosis Date  . ASCVD (arteriosclerotic cardiovascular disease)     inferior MI with circumflex PTCA in 1993;cath in 2000-50% OM 1&2,30% LAD ;2008-25% LAD, 80% nondominant right ,60% small OM 2,EF of 55% with severe basilar inferior hypokinesis; stress nuclear study in 04/2008 moderate inferior scar with peri-infarction ischemic  . Syncope   . Hyperlipidemia   . Hypertension   . Tobacco abuse     50 pack years continuing at one halp pack daily  . COPD (chronic obstructive pulmonary disease)     mild ;excercise induced hypoxemia by cp stress test ;asthma ,bronchitis,  . Restless leg syndrome   . Anxiety and depression   . GERD (gastroesophageal reflux disease)   . Inflammatory polyps of colon with rectal bleeding   . Carcinoma in situ of colon 2004    rectal polyp  . H. pylori infection 2004    treated  . Sleep apnea     does not use CPAP  . Gastritis 12/30/10    EGD Dr [name]  . Colitis, ischemic 2011  . Tubular adenoma of colon 06/2009    Colonosocpy Dr [name]  . Leukocytosis     [name]  . Depression   . Anxiety   . Lung nodule 07/16/2011  . CAD (coronary artery disease)   . Diverticulosis   . Hemorrhoids   . Asthma   . Schatzki's ring   . Hiatal hernia     Past Surgical History  Procedure Laterality Date    . Colonoscopy w/ polypectomy  2004    rectal polyp with carcinoma in situ removed via colonoscopy  . Hand surgery      surgical intervention for injury of the fingers of the left hand many years ago  . Colonoscopy  06/2009    normal terminal ileum, segmental mild inflammation of sigmoid colon (bx unremarkable), polyp, tubular adenoma  . Esophagogastroduodenoscopy  02/2009    query Barrett's but bx negative  . Esophagogastroduodenoscopy  12/30/2010    [name] Ragina Fenter,gastritis, dilated 72F, sm HH, 1 small ulcer, Duodenal erosions, benign bx  . Flexible sigmoidoscopy  12/30/2010     [name] Mahamadou Weltz,; internal hemorrhoids, anal papilla  . Heart stent    . Nasal septoplasty w/ turbinoplasty  10/06/2011    Procedure: NASAL SEPTOPLASTY WITH TURBINATE REDUCTION;  Surgeon: Izora Gala, MD;  Location: [facility];  Service: ENT;  Laterality: Bilateral;  . Cardiac catheterization    . Coronary angioplasty with stent placement  01/26/2012    "1; total is now 2"  . Esophagogastroduodenoscopy (egd) with esophageal dilation N/A 09/12/2012    [id number] Schatzki's ring s/p Maloney dilator. Small hiatal hernia. negative path  . Cholecystectomy  2004  . Colonoscopy N/A 07/31/2013    Dr.Jedrek Dinovo- redundant anal canal hemorrhoids, colonic diverticulosis, tubular adenoma  . Esophagogastroduodenoscopy (egd) with esophageal dilation N/A 07/31/2013  Dr. [name]- normal egd, s/p [facility] dilation empirically. Normal small bowel biopsies   . Hemorrhoid banding      Dr. [name]  . Left heart catheterization with coronary angiogram N/A 01/26/2012    Procedure: LEFT HEART CATHETERIZATION WITH CORONARY ANGIOGRAM;  Surgeon: [name], MD;  Location: [facility] CATH LAB;  Service: Cardiovascular;  Laterality: N/A;  . Percutaneous coronary stent intervention (pci-s) N/A 01/26/2012    Procedure: PERCUTANEOUS CORONARY STENT INTERVENTION (PCI-S);  Surgeon: [name], MD;  Location: [facility] CATH LAB;  Service: Cardiovascular;  Laterality: N/A;     Prior to Admission medications   Medication Sig Start Date End Date Taking? Authorizing Provider  albuterol (PROVENTIL) (2.5 MG/3ML) 0.083% nebulizer solution Take 2.5 mg by nebulization every 6 (six) hours as needed. For shortness of breath   Yes Historical Provider, MD  albuterol-ipratropium (COMBIVENT) 18-103 MCG/ACT inhaler Inhale 2 puffs into the lungs every 6 (six) hours as needed. For shortness of breath   Yes Historical Provider, MD  ALPRAZolam (XANAX) 1 MG tablet Take 1 tablet (1 mg total) by mouth 4 (four) times daily. 05/29/14  Yes [name], MD  aspirin EC 81 MG tablet Take 81 mg by mouth daily.    Yes Historical Provider, MD  atorvastatin (LIPITOR) 80 MG tablet Take 1 tablet (80 mg total) by mouth daily. 03/11/14  Yes Arnoldo Lenis, MD  budesonide-formoterol [facility]) 80-4.5 MCG/ACT inhaler Inhale 2 puffs into the lungs 2 (two) times daily. 07/26/13  Yes [name], MD  dronabinol (MARINOL) 2.5 MG capsule Take 1 capsule (2.5 mg total) by mouth 2 (two) times daily. 12/27/13  Yes Orvil Feil, NP  famotidine (PEPCID) 20 MG tablet Take 1 tablet (20 mg total) by mouth daily. 04/12/13  Yes Lendon Colonel, NP  FLUoxetine (PROZAC) 40 MG capsule Take 1 capsule (40 mg total) by mouth daily. 05/29/14 05/29/15 Yes [name], MD  gabapentin (NEURONTIN) 300 MG capsule Take 2 capsules (600 mg total) by mouth 3 (three) times daily. 05/29/14  Yes [name], MD  HYDROcodone-acetaminophen ([place]) 7.5-325 MG per tablet Take 1 tablet by mouth 3 (three) times daily as needed for moderate pain. 03/18/14  Yes [name], MD  lisinopril (PRINIVIL,ZESTRIL) 5 MG tablet Take 1 tablet (5 mg total) by mouth daily. 03/18/14  Yes [name], MD  metoprolol succinate (TOPROL-XL) 25 MG 24 hr tablet TAKE 1 TABLET BY MOUTH EVERY DAY 02/19/14  Yes Lendon Colonel, NP  nitroGLYCERIN (NITROSTAT) 0.4 MG SL tablet Place 0.4 mg under the tongue every 5 (five) minutes as needed for chest pain.    Yes Historical Provider, MD  OLANZapine (ZYPREXA) 10 MG tablet Take 1 tablet (10 mg total) by mouth at bedtime. 05/29/14 05/29/15 Yes [name], MD  promethazine (PHENERGAN) 12.5 MG tablet Take 1 tablet (12.5 mg total) by mouth every 6 (six) hours as needed for nausea or vomiting. 12/12/13  Yes Orvil Feil, NP  rOPINIRole (REQUIP) 0.5 MG tablet Take 1 tablet (0.5 mg total) by mouth at bedtime. 03/18/14  Yes [name], MD  vitamin B-12 (CYANOCOBALAMIN) 1000 MCG tablet Take 2,000 mcg by mouth daily.   Yes Historical Provider, MD  hydrocortisone (ANUSOL-HC) 2.5 % rectal cream Place 1 application rectally as needed. 07/23/13   Orvil Feil, NP    Allergies as of 07/19/2014  . (No Known Allergies)    Family History  Problem Relation Age of Onset  . Lung disease Father     deceased,  black lung  . Heart disease Mother     blood clots  . Depression Mother   . Cancer Paternal Uncle     unknown type  . Colon cancer Neg Hx   . ADD / ADHD Neg Hx   . Alcohol abuse Neg Hx   . Drug abuse Neg Hx   . Anxiety disorder Neg Hx   . Bipolar disorder Neg Hx   . Dementia Neg Hx   . OCD Neg Hx   . Paranoid behavior Neg Hx   . Schizophrenia Neg Hx   . Physical abuse Neg Hx   . Sexual abuse Neg Hx   . Seizures Neg Hx   . Cancer Maternal Aunt     unknown type  . Kidney failure Maternal Uncle   . Hypertension Brother   . Colon cancer Neg Hx     History   Social History  . Marital Status: Married    Spouse Name: N/A    Number of Children: 3  . Years of Education: N/A   Occupational History  . disable     [employer]   Social History Main Topics  . Smoking status: Former Smoker -- 0.50 packs/day for 30 years    Types: Cigarettes  . Smokeless tobacco: Current User     Comment: now using electronic cigarettes  . Alcohol Use: No     Comment: Drinks a beer occasionally  . Drug Use: No  . Sexual Activity: Not on file   Other Topics Concern  . Not on file   Social History Narrative   3  stepchildren    Review of Systems: See HPI, otherwise negative ROS  Physical Exam: BP 126/80 mmHg  Pulse 70  Temp(Src) 97 F (36.1 C)  Ht 5\' 10"  (1.778 m)  Wt 178 lb 3.2 oz (80.831 kg)  BMI 25.57 kg/m2 General:   Alert,  Well-developed, well-nourished, pleasant and cooperative in NAD Skin:  Intact without significant lesions or rashes. Eyes:  Sclera clear, no icterus.   Conjunctiva pink. Ears:  Normal auditory acuity. Nose:  No deformity, discharge,  or lesions. Mouth:  No deformity or lesions. Neck:  Supple; no masses or thyromegaly. No significant cervical adenopathy. Lungs:  Clear throughout to auscultation.   No wheezes, crackles, or rhonchi. No acute distress. Heart:  Regular rate and rhythm; no murmurs, clicks, rubs,  or gallops. Abdomen: Non-distended, normal bowel sounds.  No succussion splash.  Soft and nontender without appreciable mass or hepatosplenomegaly.  Pulses:  Normal pulses noted. Extremities:  Without clubbing or edema.  Impression:    59 year old gentleman with GERD and gastroparesis clinically doing very well at this time. 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  Recommendations:  GERD information.  Continue Protonix 40 mg twice daily. Repeat colonoscopy in 4 years. Office visit in 1 year     Notice: This dictation was prepared with Dragon dictation along with smaller phrase technology. Any transcriptional errors that result from this process are unintentional and may not be corrected upon review.

## 2014-07-19 NOTE — Patient Instructions (Signed)
GERD information  Continue Protonix 40 mg twice daily  Repeat colonoscopy in 4 years  Office visit in 1 year

## 2014-07-23 ENCOUNTER — Encounter: Payer: Self-pay | Admitting: Family Medicine

## 2014-07-23 ENCOUNTER — Ambulatory Visit (INDEPENDENT_AMBULATORY_CARE_PROVIDER_SITE_OTHER): Payer: Medicare Other | Admitting: Family Medicine

## 2014-07-23 VITALS — BP 110/82 | Ht 70.0 in | Wt 178.0 lb

## 2014-07-23 DIAGNOSIS — F5105 Insomnia due to other mental disorder: Secondary | ICD-10-CM | POA: Diagnosis not present

## 2014-07-23 DIAGNOSIS — E785 Hyperlipidemia, unspecified: Secondary | ICD-10-CM

## 2014-07-23 DIAGNOSIS — F341 Dysthymic disorder: Secondary | ICD-10-CM | POA: Diagnosis not present

## 2014-07-23 DIAGNOSIS — J431 Panlobular emphysema: Secondary | ICD-10-CM | POA: Diagnosis not present

## 2014-07-23 DIAGNOSIS — G894 Chronic pain syndrome: Secondary | ICD-10-CM | POA: Diagnosis not present

## 2014-07-23 DIAGNOSIS — I10 Essential (primary) hypertension: Secondary | ICD-10-CM

## 2014-07-23 DIAGNOSIS — R739 Hyperglycemia, unspecified: Secondary | ICD-10-CM

## 2014-07-23 DIAGNOSIS — F418 Other specified anxiety disorders: Secondary | ICD-10-CM

## 2014-07-23 DIAGNOSIS — K219 Gastro-esophageal reflux disease without esophagitis: Secondary | ICD-10-CM

## 2014-07-23 MED ORDER — METOPROLOL SUCCINATE ER 25 MG PO TB24: 25.0000 mg | ORAL_TABLET | Freq: Every day | ORAL | Status: DC

## 2014-07-23 MED ORDER — LISINOPRIL 5 MG PO TABS: 5.0000 mg | ORAL_TABLET | Freq: Every day | ORAL | Status: DC

## 2014-07-23 MED ORDER — HYDROCODONE-ACETAMINOPHEN 7.5-325 MG PO TABS: 1.0000 | ORAL_TABLET | Freq: Three times a day (TID) | ORAL | Status: DC | PRN

## 2014-07-23 NOTE — Progress Notes (Signed)
   Subjective:    Patient ID: [name], male    DOB: 03/10/1956, 59 y.o.   MRN: [id number]  HPI This patient was seen today for chronic pain  The medication list was reviewed and updated.   -Compliance with pain medication: Yes  The patient was advised the importance of maintaining medication and not using illegal substances with these.  Refills needed: Yes, last filled 06/18/14  The patient was educated that we can provide 3 monthly scripts for their medication, it is their responsibility to follow the instructions.  Side effects or complications from medications: No  Patient is aware that pain medications are meant to minimize the severity of the pain to allow their pain levels to improve to allow for better function. They are aware of that pain medications cannot totally remove their pain.  Due for UDT ( at least once per year) :  Next visit  Long discussion held with patient regarding compliance with medication his breathing is stable. Denies chest pressure tightness pain denies abdominal pain vomiting diarrhea rectal bleeding or hematuria. Long discussion held regarding nutrition taking medication using his CPAP machine and staying away from smoking      Review of Systems  Constitutional: Negative for activity change, appetite change and fatigue.  HENT: Negative for congestion.   Respiratory: Negative for cough.   Cardiovascular: Negative for chest pain.  Gastrointestinal: Negative for abdominal pain.  Endocrine: Negative for polydipsia and polyphagia.  Neurological: Negative for weakness.  Psychiatric/Behavioral: Negative for confusion.       Objective:   Physical Exam  Constitutional: He appears well-nourished. No distress.  Cardiovascular: Normal rate, regular rhythm and normal heart sounds.   No murmur heard. Pulmonary/Chest: Effort normal and breath sounds normal. No respiratory distress.  Musculoskeletal: He exhibits no edema.  Lymphadenopathy:    He has  no cervical adenopathy.  Neurological: He is alert.  Psychiatric: His behavior is normal.  Vitals reviewed.         Assessment & Plan:  1. Essential hypertension The importance of diet, taking medication, avoiding salt discuss. Stay physically active.  2. Panlobular emphysema Avoid smoking. Use inhalers as directed chronic problem disabled  3. Gastroesophageal reflux disease without esophagitis Under good control GI will follow him up in one year continue current medications  4. Chronic pain syndrome Prescriptions for pain medicines given. Patient has chronic low back pain and knee pain. This helps him with functioning.  5. Depression with anxiety He is taking his antidepressant. Denies any deterioration there. Follows with specialist  6. Hyperglycemia He knows he needs to watch starches in the diet stay physically active and try to lose abdominal weight  7. Hyperlipidemia Try to watch diet take medication check labs periodically  8. 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 anxiety Restless legs contribute to it.  Follow-up 3 months

## 2014-07-23 NOTE — Patient Instructions (Signed)

## 2014-08-28 ENCOUNTER — Encounter (HOSPITAL_COMMUNITY): Payer: Self-pay | Admitting: Psychiatry

## 2014-08-28 ENCOUNTER — Ambulatory Visit (INDEPENDENT_AMBULATORY_CARE_PROVIDER_SITE_OTHER): Payer: Medicare Other | Admitting: Psychiatry

## 2014-08-28 VITALS — BP 102/70 | HR 68 | Ht 70.0 in | Wt 177.8 lb

## 2014-08-28 DIAGNOSIS — F419 Anxiety disorder, unspecified: Secondary | ICD-10-CM

## 2014-08-28 DIAGNOSIS — Z720 Tobacco use: Secondary | ICD-10-CM

## 2014-08-28 DIAGNOSIS — F418 Other specified anxiety disorders: Secondary | ICD-10-CM

## 2014-08-28 DIAGNOSIS — F17200 Nicotine dependence, unspecified, uncomplicated: Secondary | ICD-10-CM

## 2014-08-28 DIAGNOSIS — F332 Major depressive disorder, recurrent severe without psychotic features: Secondary | ICD-10-CM | POA: Diagnosis not present

## 2014-08-28 DIAGNOSIS — F5105 Insomnia due to other mental disorder: Secondary | ICD-10-CM

## 2014-08-28 MED ORDER — FLUOXETINE HCL 40 MG PO CAPS: 40.0000 mg | ORAL_CAPSULE | Freq: Every day | ORAL | Status: DC

## 2014-08-28 MED ORDER — GABAPENTIN 300 MG PO CAPS: 600.0000 mg | ORAL_CAPSULE | Freq: Three times a day (TID) | ORAL | Status: DC

## 2014-08-28 MED ORDER — OLANZAPINE 10 MG PO TABS: 10.0000 mg | ORAL_TABLET | Freq: Every day | ORAL | Status: DC

## 2014-08-28 MED ORDER — ALPRAZOLAM 1 MG PO TABS: 1.0000 mg | ORAL_TABLET | Freq: Four times a day (QID) | ORAL | Status: DC

## 2014-08-28 NOTE — Progress Notes (Signed)
Patient ID: [name], male   DOB: 11/08/1955, 59 y.o.   MRN: [id number] Patient ID: [name], male   DOB: 1956-03-13, 59 y.o.   MRN: [id number] Patient ID: [name], male   DOB: 05-23-1956, 59 y.o.   MRN: [id number] Patient ID: [name], male   DOB: 11-25-1955, 59 y.o.   MRN: [id number] Patient ID: [name], male   DOB: [birth year], 59 y.o.   MRN: [id number] Patient ID: [name], male   DOB: 1955/08/29, 59 y.o.   MRN: [id number] Patient ID: [name], male   DOB: 12-31-1955, 59 y.o.   MRN: [id number] Patient ID: [name], male   DOB: 12/16/1955, 59 y.o.   MRN: [id number] [place] [address] Progress Note [name] MRN: [id number] DOB: 1955/09/20 Age: 59 y.o.  Date: 08/28/2014 Start Time: 9:50 AM End Time: 10:05 AM  Chief Complaint: Chief Complaint  Patient presents with  . Depression  . Anxiety  . Follow-up    Subjective: "I'm doing a little better"  This patient is a 59 year old married white male lives with his wife in [place]. He is on disability for coronary artery disease and COPD. He used to work for the [employer] but has not worked for the last 6 years. He has 3 step children 1 adopted child and several grandchildren.  The patient states he's been depressed and anxious primarily since he stopped working 6 years ago. He's also been through several deaths in his family his brother died suddenly 4 years ago. Following that his sister-in-law died in his home 8 months after his brother died. The patient's wife was caring for his wife's mother and she died last month at the age of 12. All of these things are disturbing to him. He's had trouble sleeping because he is thinking about the deaths. He's more depressed but not suicidal. He's not eating well and has lost about 20 pounds. He also has restless legs and chronic pain in his legs which keep him awake at night. He enjoys hunting and fishing but is less these interests recently.  The patient  returns after 3 months. He's has been doing ok.His mood is stable and he is sleeping and eating well.He and his wife have temporary custody of their 36 year old granddaughter but he is managing this well. Diagnosis:   Axis I: Anxiety Disorder NOS and Major Depression, Recurrent severe Axis II: Deferred Axis III:  Past Medical History  Diagnosis Date  . ASCVD (arteriosclerotic cardiovascular disease)     inferior MI with circumflex PTCA in 1993;cath in 2000-50% OM 1&2,30% LAD ;2008-25% LAD, 80% nondominant right ,60% small OM 2,EF of 55% with severe basilar inferior hypokinesis; stress nuclear study in 04/2008 moderate inferior scar with peri-infarction ischemic  . Syncope   . Hyperlipidemia   . Hypertension   . Tobacco abuse     50 pack years continuing at one halp pack daily  . COPD (chronic obstructive pulmonary disease)     mild ;excercise induced hypoxemia by cp stress test ;asthma ,bronchitis,  . Restless leg syndrome   . Anxiety and depression   . GERD (gastroesophageal reflux disease)   . Inflammatory polyps of colon with rectal bleeding   . Carcinoma in situ of colon 2004    rectal polyp  . H. pylori infection 2004    treated  . Sleep apnea     does not use CPAP  . Gastritis 12/30/10    EGD  Dr [name]  . Colitis, ischemic 2011  . Tubular adenoma of colon 06/2009    Colonosocpy Dr [name]  . Leukocytosis     [name]  . Depression   . Anxiety   . Lung nodule 07/16/2011  . CAD (coronary artery disease)   . Diverticulosis   . Hemorrhoids   . Asthma   . Schatzki's ring   . Hiatal hernia    Axis IV: other psychosocial or environmental problems Axis V: 58-50 serious symptoms  ADL's:  Intact  Sleep: Fair  Appetite:  Poor  Allergies: No Known Allergies Medical History: Past Medical History  Diagnosis Date  . ASCVD (arteriosclerotic cardiovascular disease)     inferior MI with circumflex PTCA in 1993;cath in 2000-50% OM 1&2,30% LAD ;2008-25% LAD, 80%  nondominant right ,60% small OM 2,EF of 55% with severe basilar inferior hypokinesis; stress nuclear study in 04/2008 moderate inferior scar with peri-infarction ischemic  . Syncope   . Hyperlipidemia   . Hypertension   . Tobacco abuse     50 pack years continuing at one halp pack daily  . COPD (chronic obstructive pulmonary disease)     mild ;excercise induced hypoxemia by cp stress test ;asthma ,bronchitis,  . Restless leg syndrome   . Anxiety and depression   . GERD (gastroesophageal reflux disease)   . Inflammatory polyps of colon with rectal bleeding   . Carcinoma in situ of colon 2004    rectal polyp  . H. pylori infection 2004    treated  . Sleep apnea     does not use CPAP  . Gastritis 12/30/10    EGD Dr [name]  . Colitis, ischemic 2011  . Tubular adenoma of colon 06/2009    Colonosocpy Dr [name]  . Leukocytosis     [name]  . Depression   . Anxiety   . Lung nodule 07/16/2011  . CAD (coronary artery disease)   . Diverticulosis   . Hemorrhoids   . Asthma   . Schatzki's ring   . Hiatal hernia    Surgical History: Past Surgical History  Procedure Laterality Date  . Colonoscopy w/ polypectomy  2004    rectal polyp with carcinoma in situ removed via colonoscopy  . Hand surgery      surgical intervention for injury of the fingers of the left hand many years ago  . Colonoscopy  06/2009    normal terminal ileum, segmental mild inflammation of sigmoid colon (bx unremarkable), polyp, tubular adenoma  . Esophagogastroduodenoscopy  02/2009    query Barrett's but bx negative  . Esophagogastroduodenoscopy  12/30/2010    [name],gastritis, dilated 27F, sm HH, 1 small ulcer, Duodenal erosions, benign bx  . Flexible sigmoidoscopy  12/30/2010     [name],; internal hemorrhoids, anal papilla  . Heart stent    . Nasal septoplasty w/ turbinoplasty  10/06/2011    Procedure: NASAL SEPTOPLASTY WITH TURBINATE REDUCTION;  Surgeon: Izora Gala, MD;  Location: [facility];   Service: ENT;  Laterality: Bilateral;  . Cardiac catheterization    . Coronary angioplasty with stent placement  01/26/2012    "1; total is now 2"  . Esophagogastroduodenoscopy (egd) with esophageal dilation N/A 09/12/2012    DZH:[id number] Schatzki's ring s/p Maloney dilator. Small hiatal hernia. negative path  . Cholecystectomy  2004  . Colonoscopy N/A 07/31/2013    Dr.Rourk- redundant anal canal hemorrhoids, colonic diverticulosis, tubular adenoma  . Esophagogastroduodenoscopy (egd) with esophageal dilation N/A 07/31/2013    Dr. [name]-  normal egd, s/p Maloney dilation empirically. Normal small bowel biopsies   . Hemorrhoid banding      Dr. [name]  . Left heart catheterization with coronary angiogram N/A 01/26/2012    Procedure: LEFT HEART CATHETERIZATION WITH CORONARY ANGIOGRAM;  Surgeon: [name], MD;  Location: [facility] CATH LAB;  Service: Cardiovascular;  Laterality: N/A;  . Percutaneous coronary stent intervention (pci-s) N/A 01/26/2012    Procedure: PERCUTANEOUS CORONARY STENT INTERVENTION (PCI-S);  Surgeon: [name], MD;  Location: [facility] CATH LAB;  Service: Cardiovascular;  Laterality: N/A;   Family History: family history includes Cancer in his maternal aunt and paternal uncle; Depression in his mother; Heart disease in his mother; Hypertension in his brother; Kidney failure in his maternal uncle; Lung disease in his father. There is no history of Colon cancer, ADD / ADHD, Alcohol abuse, Drug abuse, Anxiety disorder, Bipolar disorder, Dementia, OCD, Paranoid behavior, Schizophrenia, Physical abuse, Sexual abuse, Seizures, or Colon cancer. Reviewed and nothing new today.  Suicidal Ideation:  Pt denies Homicidal Ideation:  Pt has some struggles with his daughter's boy friend.  He stays away from him. AEB (as evidenced by):per pt report  Psychiatric Specialty Exam: ROS Neuro: no headaches, ataxia, weakness, gives out easily GI: no N/V/cramps/constipation, he keeps diarrhea for the  past 6 or 7 years MS: no weakness or aches. He catches cramps really bad.   Blood pressure 102/70, pulse 68, height 5\' 10"  (1.778 m), weight 177 lb 12.8 oz (80.65 kg).Body mass index is 25.51 kg/(m^2).  General Appearance: Casual  Eye Contact::  Fair  Speech:  Clear and Coherent  Volume:  Normal  Mood:  Fairly good  Affect:  Congruent  Thought Process:  Coherent, Linear and Logical  Orientation:  Full (Time, Place, and Person)  Thought Content:  Within normal limits   Suicidal Thoughts:  no  Homicidal Thoughts:  No  Memory:  Immediate;   Fair Recent;   Fair Remote;   Fair  Judgement:  Fair  Insight:  Fair  Psychomotor Activity:  Normal  Concentration:  Fair  Recall:  Fair  Akathisia:  No  Handed:  Right  AIMS (if indicated):     Assets:  Communication Skills Desire for Improvement  Sleep:      Current Medications: Zyprexa 10 mg at bed time Xanax 1 mg TID Neurontin 600 mg 3 times a day Prozac 40 mg per day Lab Results: No results found for this or any previous visit (from the past 48 hour(s)). Cardiologist draws periodic labs.  He has not heard back on the last set.  They usually tell him if there are concerns.  Physical Findings: AIMS:  , ,  ,  ,    CIWA:    COWS:     Treatment Plan Summary: Medication management  Plan: I took his vitals.  I reviewed CC, tobacco/med/surg Hx, meds effects/ side effects, problem list, therapies and responses as well as current situation/symptoms discussed options. Continue current medications He'll return to see me in 3 months but call if symptoms worsen before that See orders and pt instructions for more details.  MEDICATIONS this encounter: Meds ordered this encounter  Medications  . gabapentin (NEURONTIN) 300 MG capsule    Sig: Take 2 capsules (600 mg total) by mouth 3 (three) times daily.    Dispense:  180 capsule    Refill:  2  . FLUoxetine (PROZAC) 40 MG capsule    Sig: Take 1 capsule (40 mg total) by mouth daily.  Dispense:  30 capsule    Refill:  2  . OLANZapine (ZYPREXA) 10 MG tablet    Sig: Take 1 tablet (10 mg total) by mouth at bedtime.    Dispense:  30 tablet    Refill:  2  . ALPRAZolam (XANAX) 1 MG tablet    Sig: Take 1 tablet (1 mg total) by mouth 4 (four) times daily.    Dispense:  120 tablet    Refill:  2    Medical Decision Making Problem Points:  Established problem, stable/improving (1), Review of last therapy session (1) and Review of psycho-social stressors (1) Data Points:  Review or order clinical lab tests (1) Review of medication regiment & side effects (2)  I certify that outpatient services furnished can reasonably be expected to improve the patient's condition.   Lavoy Bernards, [facility] 08/28/2014, 2:50 PM

## 2014-10-21 ENCOUNTER — Telehealth: Payer: Self-pay | Admitting: *Deleted

## 2014-10-21 ENCOUNTER — Ambulatory Visit (INDEPENDENT_AMBULATORY_CARE_PROVIDER_SITE_OTHER): Payer: Medicare Other | Admitting: Family Medicine

## 2014-10-21 ENCOUNTER — Encounter: Payer: Self-pay | Admitting: Family Medicine

## 2014-10-21 VITALS — BP 118/72 | Ht 70.0 in | Wt 176.0 lb

## 2014-10-21 DIAGNOSIS — K625 Hemorrhage of anus and rectum: Secondary | ICD-10-CM | POA: Diagnosis not present

## 2014-10-21 DIAGNOSIS — G894 Chronic pain syndrome: Secondary | ICD-10-CM | POA: Diagnosis not present

## 2014-10-21 DIAGNOSIS — J438 Other emphysema: Secondary | ICD-10-CM

## 2014-10-21 DIAGNOSIS — E785 Hyperlipidemia, unspecified: Secondary | ICD-10-CM

## 2014-10-21 DIAGNOSIS — G2581 Restless legs syndrome: Secondary | ICD-10-CM

## 2014-10-21 DIAGNOSIS — Z79899 Other long term (current) drug therapy: Secondary | ICD-10-CM

## 2014-10-21 DIAGNOSIS — R739 Hyperglycemia, unspecified: Secondary | ICD-10-CM

## 2014-10-21 DIAGNOSIS — I10 Essential (primary) hypertension: Secondary | ICD-10-CM

## 2014-10-21 MED ORDER — HYDROCODONE-ACETAMINOPHEN 7.5-325 MG PO TABS: 1.0000 | ORAL_TABLET | Freq: Three times a day (TID) | ORAL | Status: DC | PRN

## 2014-10-21 NOTE — Patient Instructions (Addendum)
As part of your visit today we have covered your chronic pain. You have been given prescription(s) for pain medicines.The DEA and [place] require that any patient on pain medications must be seen every 3 months. You are expected to come in for a office visit before further pain medications are issued.  Since we are managing your pain do not get pain scripts from other doctors. We check the prescription registry regularly. If you are receiving pain medicines from another source we will STOP prescribing pain medicines.   We will not refill medications or early nor will we give an extended month supply at the end of these prescriptions.It is your responsibility to keep up with medications. They will not be replaced.  It is your responsibility to schedule an office visit in 3-4 months to be seen before you are out of your medication. Do not call our office to request early refills or additional refills. Do not wait till the last moment to schedule the follow up visit. We highly recommend you schedule this now for 3 months.  We believe that most patients take their meds as prescribed but drug misuse and diversion is a serious problem in the [place]. Our office does standard measures to insure proper care to all. All patients are subject to random urine drug screens/ saliva tests and random pill counts. Also all patients drug prescription records are reviewed on a regular basis in accordance with [facility] medical board policies.  Remember, do not use alcohol or illegal drugs with your pain medications.    We are required by law to adhere to strict regulations. Failure on our part to follow these regulations could jeopardize our prescription license which in turn would cause [place] not to be able to care for you.Thank you for your understanding and following these policies.   Please bring all meds with you on next visit

## 2014-10-21 NOTE — Telephone Encounter (Signed)
Patient stated he will come back by office this afternoon or tomorrow to leave a new sample.

## 2014-10-21 NOTE — Telephone Encounter (Signed)
Pt needs to return to office this week to give another urine sample. The sample he left was not within temp range.

## 2014-10-21 NOTE — Progress Notes (Signed)
   Subjective:    Patient ID: [name], male    DOB: [birth year], 59 y.o.   MRN: [id number]  HPI This patient was seen today for chronic pain  The medication list was reviewed and updated.   -Compliance with pain medication: takes hyrdocodone 7.5/325 one up to tid prn  The patient was advised the importance of maintaining medication and not using illegal substances with these.  Refills needed: yes  The patient was educated that we can provide 3 monthly scripts for their medication, it is their responsibility to follow the instructions.  Side effects or complications from medications: none  Patient is aware that pain medications are meant to minimize the severity of the pain to allow their pain levels to improve to allow for better function. They are aware of that pain medications cannot totally remove their pain.  Due for UDT ( at least once per year) : this visit  Pt states no other concerns today.       Review of Systems  Constitutional: Negative for activity change, appetite change and fatigue.  HENT: Negative for congestion.   Respiratory: Negative for cough.   Cardiovascular: Negative for chest pain.  Gastrointestinal: Negative for vomiting and abdominal pain.  Endocrine: Negative for polydipsia and polyphagia.  Neurological: Negative for weakness.  Psychiatric/Behavioral: Negative for confusion.       Objective:   Physical Exam  Constitutional: He appears well-nourished. No distress.  Cardiovascular: Normal rate, regular rhythm and normal heart sounds.   No murmur heard. Pulmonary/Chest: Effort normal and breath sounds normal. No respiratory distress.  Musculoskeletal: He exhibits no edema.  Lymphadenopathy:    He has no cervical adenopathy.  Neurological: He is alert.  Psychiatric: His behavior is normal.  Vitals reviewed.  I discussed with the patient at length her pain medicine contract. Answered any questions he had. Also discuss how increased risk of  accidental overdose he takes medicine in a nonprescribed way. Addition to this patient voiced understanding denies drug screen collected today       Assessment & Plan:  1. High risk medication use We discussed how narcotic pain medications and anxiety medicines that he is using for his psychiatric illness can sometimes interact he should not take both at the same time especially at bedtime.  2. Essential hypertension Blood pressure under decent control continue current measures watch diet stay active quit smoking  3. Other emphysema Patient was encouraged quit smoking he states he's not having uses inhalers much at all unless it's hot or humid outside.  4. Chronic pain syndrome Patient has chronic low back pain radiating into his legs pain medication helps him some days he uses up to 3 other days only 1 or 2  5. RLS (restless legs syndrome) He uses his medication and seems to help him  6. Hyperglycemia He does have prediabetes he is encouraged to watch his diet stay physically active we'll monitor her A1 C's - Hemoglobin A1c  7. Hyperlipidemia Has history of hyperlipidemia take his medication watch diet results - Lipid panel  8. Rectal bleeding Patient is up-to-date on colonoscopy but he states he saw blood in his bowel movements about a month ago I encouraged him to do Hemoccult cards 3 - POC Hemoccult Bld/Stl (3-Cd Home Screen); Future - POC Hemoccult Bld/Stl (3-Cd Home Screen) Multiple health issues were addressed today greater than 25 minutes spent discussing all of these problems listed in assessment and plan 99214 see above

## 2014-10-23 DIAGNOSIS — Z79899 Other long term (current) drug therapy: Secondary | ICD-10-CM | POA: Diagnosis not present

## 2014-10-23 DIAGNOSIS — E785 Hyperlipidemia, unspecified: Secondary | ICD-10-CM | POA: Diagnosis not present

## 2014-10-23 DIAGNOSIS — R739 Hyperglycemia, unspecified: Secondary | ICD-10-CM | POA: Diagnosis not present

## 2014-10-23 NOTE — Addendum Note (Signed)
Addended by: Carmelina Noun on: 10/23/2014 08:17 AM   Modules accepted: Orders

## 2014-10-24 ENCOUNTER — Encounter: Payer: Self-pay | Admitting: Family Medicine

## 2014-10-24 LAB — LIPID PANEL
Chol/HDL Ratio: 6 ratio units — ABNORMAL HIGH (ref 0.0–5.0)
Cholesterol, Total: 155 mg/dL (ref 100–199)
HDL: 26 mg/dL — AB (ref >39)
LDL CALC: 62 mg/dL (ref 0–99)
Triglycerides: 333 mg/dL — ABNORMAL HIGH (ref 0–149)
VLDL Cholesterol Cal: 67 mg/dL — ABNORMAL HIGH (ref 5–40)

## 2014-10-24 LAB — HEMOGLOBIN A1C
Est. average glucose Bld gHb Est-mCnc: 103 mg/dL
Hgb A1c MFr Bld: 5.2 % (ref 4.8–5.6)

## 2014-10-29 LAB — TOXASSURE SELECT 13 (MW), URINE: PDF: 0

## 2014-10-30 LAB — POC HEMOCCULT BLD/STL (HOME/3-CARD/SCREEN)
Card #3 Fecal Occult Blood, POC: NEGATIVE
FECAL OCCULT BLD: NEGATIVE
FECAL OCCULT BLD: NEGATIVE

## 2014-11-28 ENCOUNTER — Ambulatory Visit (INDEPENDENT_AMBULATORY_CARE_PROVIDER_SITE_OTHER): Payer: Medicare Other | Admitting: Psychiatry

## 2014-11-28 ENCOUNTER — Encounter (HOSPITAL_COMMUNITY): Payer: Self-pay | Admitting: Psychiatry

## 2014-11-28 VITALS — BP 127/75 | HR 74 | Ht 70.0 in | Wt 172.6 lb

## 2014-11-28 DIAGNOSIS — F332 Major depressive disorder, recurrent severe without psychotic features: Secondary | ICD-10-CM | POA: Diagnosis not present

## 2014-11-28 DIAGNOSIS — F418 Other specified anxiety disorders: Secondary | ICD-10-CM

## 2014-11-28 DIAGNOSIS — F5105 Insomnia due to other mental disorder: Secondary | ICD-10-CM

## 2014-11-28 DIAGNOSIS — F419 Anxiety disorder, unspecified: Secondary | ICD-10-CM | POA: Diagnosis not present

## 2014-11-28 DIAGNOSIS — F17200 Nicotine dependence, unspecified, uncomplicated: Secondary | ICD-10-CM

## 2014-11-28 MED ORDER — GABAPENTIN 300 MG PO CAPS: 600.0000 mg | ORAL_CAPSULE | Freq: Three times a day (TID) | ORAL | Status: DC

## 2014-11-28 MED ORDER — FLUOXETINE HCL 40 MG PO CAPS: 40.0000 mg | ORAL_CAPSULE | Freq: Every day | ORAL | Status: DC

## 2014-11-28 MED ORDER — ALPRAZOLAM 1 MG PO TABS: 1.0000 mg | ORAL_TABLET | Freq: Four times a day (QID) | ORAL | Status: DC

## 2014-11-28 MED ORDER — OLANZAPINE 10 MG PO TABS: 10.0000 mg | ORAL_TABLET | Freq: Every day | ORAL | Status: DC

## 2014-11-28 NOTE — Progress Notes (Signed)
Patient ID: SELVIN YUN, male   DOB: 02-12-1956, 59 y.o.   MRN: [id number] Patient ID: AARSH FRISTOE, male   DOB: 11/08/1955, 59 y.o.   MRN: [id number] Patient ID: JESTER KLINGBERG, male   DOB: 10/06/1955, 59 y.o.   MRN: [id number] Patient ID: KEYONTAE HUCKEBY, male   DOB: [birth year], 59 y.o.   MRN: [id number] Patient ID: RIDHAAN DREIBELBIS, male   DOB: [birth year], 59 y.o.   MRN: [id number] Patient ID: LOVIE AGRESTA, male   DOB: 1956-04-21, 59 y.o.   MRN: [id number] Patient ID: DYSON SEVEY, male   DOB: 1956-05-26, 59 y.o.   MRN: [id number] Patient ID: DELVECCHIO MADOLE, male   DOB: 1955/09/25, 59 y.o.   MRN: [id number] Patient ID: JAIKOB BORGWARDT, male   DOB: [birth year], 59 y.o.   MRN: [id number] Progress Note SYMON NORWOOD MRN: [id number] DOB: [birth year] Age: 59 y.o.  Date: 11/28/2014 Start Time: 9:50 AM End Time: 10:05 AM  Chief Complaint: Chief Complaint  Patient presents with  . Depression  . Anxiety  . Follow-up    Subjective: "I'm doing ok"  This patient is a 59 year old married white male lives with his wife in [place]. He is on disability for coronary artery disease and COPD. He used to work for the [employer] but has not worked for the last 6 years. He has 3 step children 1 adopted child and several grandchildren.  The patient states he's been depressed and anxious primarily since he stopped working 6 years ago. He's also been through several deaths in his family his brother died suddenly 4 years ago. Following that his sister-in-law died in his home 8 months after his brother died. The patient's wife was caring for his wife's mother and she died last month at the age of 42. All of these things are disturbing to him. He's had trouble sleeping because he is thinking about the deaths. He's more depressed but not suicidal. He's not eating well and has lost about 20 pounds. He also has restless legs and chronic pain in his legs which keep him awake at night. He enjoys hunting and  fishing but is less these interests recently.  The patient returns after 3 months. He's has been doing ok.His mood is stable and he is sleeping and eating well. His daughter has returned from jail and is starting to act out again. She still money from her own daughters piggyback and is probably using drugs. Her 60-year-old daughter states there is well on the patient and his wife are thinking about trying to get custody of the granddaughter. Even though these situations are stressful he seems to be handling it well Diagnosis:   Axis I: Anxiety Disorder NOS and Major Depression, Recurrent severe Axis II: Deferred Axis III:  Past Medical History  Diagnosis Date  . ASCVD (arteriosclerotic cardiovascular disease)     inferior MI with circumflex PTCA in 1993;cath in 2000-50% OM 1&2,30% LAD ;2008-25% LAD, 80% nondominant right ,60% small OM 2,EF of 55% with severe basilar inferior hypokinesis; stress nuclear study in 04/2008 moderate inferior scar with peri-infarction ischemic  . Syncope   . Hyperlipidemia   . Hypertension   . Tobacco abuse     50 pack years continuing at one halp pack daily  . COPD (chronic obstructive pulmonary disease)     mild ;excercise induced hypoxemia by cp stress test ;asthma ,bronchitis,  . Restless leg syndrome   . Anxiety and depression   .  GERD (gastroesophageal reflux disease)   . Inflammatory polyps of colon with rectal bleeding   . Carcinoma in situ of colon 2004    rectal polyp  . H. pylori infection 2004    treated  . Sleep apnea     does not use CPAP  . Gastritis 12/30/10    EGD Dr [name]  . Colitis, ischemic 2011  . Tubular adenoma of colon 06/2009    Colonosocpy Dr [name]  . Leukocytosis     [name]  . Depression   . Anxiety   . Lung nodule 07/16/2011  . CAD (coronary artery disease)   . Diverticulosis   . Hemorrhoids   . Asthma   . Schatzki's ring   . Hiatal hernia    Axis IV: other psychosocial or environmental problems Axis V: 41-50  serious symptoms  ADL's:  Intact  Sleep: Fair  Appetite:  Poor  Allergies: No Known Allergies Medical History: Past Medical History  Diagnosis Date  . ASCVD (arteriosclerotic cardiovascular disease)     inferior MI with circumflex PTCA in 1993;cath in 2000-50% OM 1&2,30% LAD ;2008-25% LAD, 80% nondominant right ,60% small OM 2,EF of 55% with severe basilar inferior hypokinesis; stress nuclear study in 04/2008 moderate inferior scar with peri-infarction ischemic  . Syncope   . Hyperlipidemia   . Hypertension   . Tobacco abuse     50 pack years continuing at one halp pack daily  . COPD (chronic obstructive pulmonary disease)     mild ;excercise induced hypoxemia by cp stress test ;asthma ,bronchitis,  . Restless leg syndrome   . Anxiety and depression   . GERD (gastroesophageal reflux disease)   . Inflammatory polyps of colon with rectal bleeding   . Carcinoma in situ of colon 2004    rectal polyp  . H. pylori infection 2004    treated  . Sleep apnea     does not use CPAP  . Gastritis 12/30/10    EGD Dr [name]  . Colitis, ischemic 2011  . Tubular adenoma of colon 06/2009    Colonosocpy Dr [name]  . Leukocytosis     [name]  . Depression   . Anxiety   . Lung nodule 07/16/2011  . CAD (coronary artery disease)   . Diverticulosis   . Hemorrhoids   . Asthma   . Schatzki's ring   . Hiatal hernia    Surgical History: Past Surgical History  Procedure Laterality Date  . Colonoscopy w/ polypectomy  2004    rectal polyp with carcinoma in situ removed via colonoscopy  . Hand surgery      surgical intervention for injury of the fingers of the left hand many years ago  . Colonoscopy  06/2009    normal terminal ileum, segmental mild inflammation of sigmoid colon (bx unremarkable), polyp, tubular adenoma  . Esophagogastroduodenoscopy  02/2009    query Barrett's but bx negative  . Esophagogastroduodenoscopy  12/30/2010    [name],gastritis, dilated 41F, sm HH, 1  small ulcer, Duodenal erosions, benign bx  . Flexible sigmoidoscopy  12/30/2010     [name],; internal hemorrhoids, anal papilla  . Heart stent    . Nasal septoplasty w/ turbinoplasty  10/06/2011    Procedure: NASAL SEPTOPLASTY WITH TURBINATE REDUCTION;  Surgeon: Izora Gala, MD;  Location: [facility];  Service: ENT;  Laterality: Bilateral;  . Cardiac catheterization    . Coronary angioplasty with stent placement  01/26/2012    "1; total is now 2"  .  Esophagogastroduodenoscopy (egd) with esophageal dilation N/A 09/12/2012    GUR:[id number] Schatzki's ring s/p Maloney dilator. Small hiatal hernia. negative path  . Cholecystectomy  2004  . Colonoscopy N/A 07/31/2013    Dr.Rourk- redundant anal canal hemorrhoids, colonic diverticulosis, tubular adenoma  . Esophagogastroduodenoscopy (egd) with esophageal dilation N/A 07/31/2013    Dr. [name]- normal egd, s/p [facility] dilation empirically. Normal small bowel biopsies   . Hemorrhoid banding      Dr. [name]  . Left heart catheterization with coronary angiogram N/A 01/26/2012    Procedure: LEFT HEART CATHETERIZATION WITH CORONARY ANGIOGRAM;  Surgeon: [name], MD;  Location[facility] CATH LAB;  Service: Cardiovascular;  Laterality: N/A;  . Percutaneous coronary stent intervention (pci-s) N/A 01/26/2012    Procedure: PERCUTANEOUS CORONARY STENT INTERVENTION (PCI-S);  Surgeon: [name], MD;  Location: [facility] CATH LAB;  Service: Cardiovascular;  Laterality: N/A;   Family History: family history includes Cancer in his maternal aunt and paternal uncle; Depression in his mother; Heart disease in his mother; Hypertension in his brother; Kidney failure in his maternal uncle; Lung disease in his father. There is no history of Colon cancer, ADD / ADHD, Alcohol abuse, Drug abuse, Anxiety disorder, Bipolar disorder, Dementia, OCD, Paranoid behavior, Schizophrenia, Physical abuse, Sexual abuse, Seizures, or Colon cancer. Reviewed and nothing new today.  Suicidal  Ideation:  Pt denies Homicidal Ideation:  Pt has some struggles with his daughter's boy friend.  He stays away from him. AEB (as evidenced by):per pt report  Psychiatric Specialty Exam: ROS Neuro: no headaches, ataxia, weakness, gives out easily GI: no N/V/cramps/constipation, he keeps diarrhea for the past 6 or 7 years MS: no weakness or aches. He catches cramps really bad.   Blood pressure 127/75, pulse 74, height 5\' 10"  (1.778 m), weight 172 lb 9.6 oz (78.291 kg).Body mass index is 24.77 kg/(m^2).  General Appearance: Casual  Eye Contact::  Fair  Speech:  Clear and Coherent  Volume:  Normal  Mood:  Fairly good worried   Affect:  Congruent  Thought Process:  Coherent, Linear and Logical  Orientation:  Full (Time, Place, and Person)  Thought Content:  Within normal limits   Suicidal Thoughts:  no  Homicidal Thoughts:  No  Memory:  Immediate;   Fair Recent;   Fair Remote;   Fair  Judgement:  Fair  Insight:  Fair  Psychomotor Activity:  Normal  Concentration:  Fair  Recall:  Fair  Akathisia:  No  Handed:  Right  AIMS (if indicated):     Assets:  Communication Skills Desire for Improvement  Sleep:      Current Medications: Zyprexa 10 mg at bed time Xanax 1 mg TID Neurontin 600 mg 3 times a day Prozac 40 mg per day Lab Results: No results found for this or any previous visit (from the past 48 hour(s)). Marland Kitchen  Physical Findings: AIMS:  , ,  ,  ,    CIWA:    COWS:     Treatment Plan Summary: Medication management  Plan: I took his vitals.  I reviewed CC, tobacco/med/surg Hx, meds effects/ side effects, problem list, therapies and responses as well as current situation/symptoms discussed options. He will continue Prozac for depression, Zyprexa for mood stabilization and Neurontin and Xanax for anxiety He'll return to see me in 3 months but call if symptoms worsen before that See orders and pt instructions for more details.  MEDICATIONS this encounter: Meds ordered  this encounter  Medications  . gabapentin (NEURONTIN) 300 MG capsule  Sig: Take 2 capsules (600 mg total) by mouth 3 (three) times daily.    Dispense:  180 capsule    Refill:  2  . OLANZapine (ZYPREXA) 10 MG tablet    Sig: Take 1 tablet (10 mg total) by mouth at bedtime.    Dispense:  30 tablet    Refill:  2  . FLUoxetine (PROZAC) 40 MG capsule    Sig: Take 1 capsule (40 mg total) by mouth daily.    Dispense:  30 capsule    Refill:  2  . ALPRAZolam (XANAX) 1 MG tablet    Sig: Take 1 tablet (1 mg total) by mouth 4 (four) times daily.    Dispense:  120 tablet    Refill:  2    Medical Decision Making Problem Points:  Established problem, stable/improving (1), Review of last therapy session (1) and Review of psycho-social stressors (1) Data Points:  Review or order clinical lab tests (1) Review of medication regiment & side effects (2)  I certify that outpatient services furnished can reasonably be expected to improve the patient's condition.   Otilio Groleau, [facility] 11/28/2014, 11:07 AM

## 2015-01-06 ENCOUNTER — Other Ambulatory Visit: Payer: Self-pay

## 2015-01-07 MED ORDER — PANTOPRAZOLE SODIUM 40 MG PO TBEC: 40.0000 mg | DELAYED_RELEASE_TABLET | Freq: Two times a day (BID) | ORAL | Status: DC

## 2015-01-09 ENCOUNTER — Other Ambulatory Visit: Payer: Self-pay

## 2015-01-21 ENCOUNTER — Encounter: Payer: Self-pay | Admitting: Family Medicine

## 2015-01-21 ENCOUNTER — Ambulatory Visit (INDEPENDENT_AMBULATORY_CARE_PROVIDER_SITE_OTHER): Payer: Medicare Other | Admitting: Family Medicine

## 2015-01-21 VITALS — BP 128/82 | Ht 70.0 in | Wt 177.0 lb

## 2015-01-21 DIAGNOSIS — I10 Essential (primary) hypertension: Secondary | ICD-10-CM

## 2015-01-21 DIAGNOSIS — G894 Chronic pain syndrome: Secondary | ICD-10-CM | POA: Diagnosis not present

## 2015-01-21 MED ORDER — HYDROCODONE-ACETAMINOPHEN 7.5-325 MG PO TABS: 1.0000 | ORAL_TABLET | Freq: Three times a day (TID) | ORAL | Status: DC | PRN

## 2015-01-21 NOTE — Progress Notes (Signed)
   Subjective:    Patient ID: [name], male    DOB: 1955-07-13, 59 y.o.   MRN: [id number]  HPI This patient was seen today for chronic pain  The medication list was reviewed and updated.   -Compliance with pain medication: yes-hydrocodone 7.5/325mg  TID  The patient was advised the importance of maintaining medication and not using illegal substances with these.  Refills needed: yes  The patient was educated that we can provide 3 monthly scripts for their medication, it is their responsibility to follow the instructions.  Side effects or complications from medications: none  Patient is aware that pain medications are meant to minimize the severity of the pain to allow their pain levels to improve to allow for better function. They are aware of that pain medications cannot totally remove their pain.  Due for UDT ( at least once per year) : 10/2014      patient relates compliance states that it seems to be helping him denies abusing it   Review of Systems  Constitutional: Negative for activity change, appetite change and fatigue.  HENT: Negative for congestion.   Respiratory: Negative for cough.   Cardiovascular: Negative for chest pain.  Gastrointestinal: Negative for abdominal pain.  Endocrine: Negative for polydipsia and polyphagia.  Neurological: Negative for weakness.  Psychiatric/Behavioral: Negative for confusion.       Objective:   Physical Exam  Constitutional: He appears well-nourished.  Cardiovascular: Normal rate, regular rhythm and normal heart sounds.   No murmur heard. Pulmonary/Chest: Effort normal and breath sounds normal.  Musculoskeletal: He exhibits no edema.  Lymphadenopathy:    He has no cervical adenopathy.  Neurological: He is alert.  Psychiatric: His behavior is normal.  Vitals reviewed.         Assessment & Plan:   pain management patient takes his pain medicine 3 times a day he does not use it at bedtime    he is managed by  psychiatry for his mental health illness I advised the patient to try to minimize Xanax as much is possible to lessen the risk of overdosing    cardiac condition stable he was encouraged to stay away from smoking all the time he is to follow-up here in 3 months comprehensive lab work be due on next visit

## 2015-02-28 ENCOUNTER — Encounter (HOSPITAL_COMMUNITY): Payer: Self-pay | Admitting: *Deleted

## 2015-02-28 ENCOUNTER — Ambulatory Visit (HOSPITAL_COMMUNITY): Payer: Self-pay | Admitting: Psychiatry

## 2015-03-08 ENCOUNTER — Other Ambulatory Visit (HOSPITAL_COMMUNITY): Payer: Self-pay | Admitting: Psychiatry

## 2015-03-13 ENCOUNTER — Ambulatory Visit (INDEPENDENT_AMBULATORY_CARE_PROVIDER_SITE_OTHER): Payer: Medicare Other | Admitting: Psychiatry

## 2015-03-13 ENCOUNTER — Encounter (HOSPITAL_COMMUNITY): Payer: Self-pay | Admitting: Psychiatry

## 2015-03-13 VITALS — BP 130/84 | HR 61 | Ht 70.0 in | Wt 173.2 lb

## 2015-03-13 DIAGNOSIS — F332 Major depressive disorder, recurrent severe without psychotic features: Secondary | ICD-10-CM

## 2015-03-13 DIAGNOSIS — F418 Other specified anxiety disorders: Secondary | ICD-10-CM

## 2015-03-13 DIAGNOSIS — F419 Anxiety disorder, unspecified: Secondary | ICD-10-CM

## 2015-03-13 MED ORDER — FLUOXETINE HCL 40 MG PO CAPS: 40.0000 mg | ORAL_CAPSULE | Freq: Every day | ORAL | Status: DC

## 2015-03-13 MED ORDER — FLUOXETINE HCL 20 MG PO CAPS: 20.0000 mg | ORAL_CAPSULE | Freq: Every day | ORAL | Status: DC

## 2015-03-13 MED ORDER — ALPRAZOLAM 1 MG PO TABS: 1.0000 mg | ORAL_TABLET | Freq: Four times a day (QID) | ORAL | Status: DC

## 2015-03-13 MED ORDER — OLANZAPINE 10 MG PO TABS: 10.0000 mg | ORAL_TABLET | Freq: Every day | ORAL | Status: DC

## 2015-03-13 NOTE — Progress Notes (Signed)
Patient ID: [name], male   DOB: 03-25-1956, 59 y.o.   MRN: [id number] Patient ID: [name], male   DOB: [birth year], 59 y.o.   MRN: [id number] Patient ID: [name], male   DOB: July 01, 1955, 59 y.o.   MRN: [id number] Patient ID: [name], male   DOB: Apr 10, 1956, 59 y.o.   MRN: [id number] Patient ID: [name], male   DOB: Nov 05, 1955, 59 y.o.   MRN: [id number] Patient ID: [name], male   DOB: 1955-12-16, 59 y.o.   MRN: [id number] Patient ID: [name], male   DOB: October 05, 1955, 59 y.o.   MRN: [id number] Patient ID: [name], male   DOB: Oct 05, 1955, 59 y.o.   MRN: [id number] Patient ID: [name], male   DOB: 08-06-1955, 59 y.o.   MRN: [id number] Patient ID: [name], male   DOB: 31-Aug-1955, 58 y.o.   MRN: [id number] [place] [id number] Progress Note [name] MRN: [id number] DOB: 1955/11/14 Age: 59 y.o.  Date: 03/13/2015 Start Time: 9:50 AM End Time: 10:05 AM  Chief Complaint: Chief Complaint  Patient presents with  . Depression  . Anxiety  . Follow-up    Subjective: "I've been more depressed"  This patient is a 59 year old married white male lives with his wife in [place]. He is on disability for coronary artery disease and COPD. He used to work for the [employer] but has not worked for the last 6 years. He has 3 step children 1 adopted child and several grandchildren.  The patient states he's been depressed and anxious primarily since he stopped working 6 years ago. He's also been through several deaths in his family his brother died suddenly 4 years ago. Following that his sister-in-law died in his home 8 months after his brother died. The patient's wife was caring for his wife's mother and she died last month at the age of 68. All of these things are disturbing to him. He's had trouble sleeping because he is thinking about the deaths. He's more depressed but not suicidal. He's not eating well and has lost about 20 pounds. He also has restless  legs and chronic pain in his legs which keep him awake at night. He enjoys hunting and fishing but is less these interests recently.  The patient returns after 3 months. He states that he's been a bit more depressed lately. His daughter is pregnant again and she is the one who has a substance abuse problems. He claims she's not using drugs right now but he worries about it. He and his wife take care of her 85-year-old child. He recently lost his niece. She had been in a car wreck years ago and was paralyzed and got septic from bedsores and died at age 42. He's lost motivation recently just doesn't have energy to do much. He denies suicidal ideation. I suggested we go up on his Prozac and he agrees Diagnosis:   Axis I: Anxiety Disorder NOS and Major Depression, Recurrent severe Axis II: Deferred Axis III:  Past Medical History  Diagnosis Date  . ASCVD (arteriosclerotic cardiovascular disease)     inferior MI with circumflex PTCA in 1993;cath in 2000-50% OM 1&2,30% LAD ;2008-25% LAD, 80% nondominant right ,60% small OM 2,EF of 55% with severe basilar inferior hypokinesis; stress nuclear study in 04/2008 moderate inferior scar with peri-infarction ischemic  . Syncope   . Hyperlipidemia   . Hypertension   . Tobacco abuse     50 pack  years continuing at one halp pack daily  . COPD (chronic obstructive pulmonary disease)     mild ;excercise induced hypoxemia by cp stress test ;asthma ,bronchitis,  . Restless leg syndrome   . Anxiety and depression   . GERD (gastroesophageal reflux disease)   . Inflammatory polyps of colon with rectal bleeding   . Carcinoma in situ of colon 2004    rectal polyp  . H. pylori infection 2004    treated  . Sleep apnea     does not use CPAP  . Gastritis 12/30/10    EGD Dr [name]  . Colitis, ischemic 2011  . Tubular adenoma of colon 06/2009    Colonosocpy Dr [name]  . Leukocytosis     [name]  . Depression   . Anxiety   . Lung nodule 07/16/2011  . CAD  (coronary artery disease)   . Diverticulosis   . Hemorrhoids   . Asthma   . Schatzki's ring   . Hiatal hernia    Axis IV: other psychosocial or environmental problems Axis V: 41-50 serious symptoms  ADL's:  Intact  Sleep: Fair  Appetite:  Poor  Allergies: No Known Allergies Medical History: Past Medical History  Diagnosis Date  . ASCVD (arteriosclerotic cardiovascular disease)     inferior MI with circumflex PTCA in 1993;cath in 2000-50% OM 1&2,30% LAD ;2008-25% LAD, 80% nondominant right ,60% small OM 2,EF of 55% with severe basilar inferior hypokinesis; stress nuclear study in 04/2008 moderate inferior scar with peri-infarction ischemic  . Syncope   . Hyperlipidemia   . Hypertension   . Tobacco abuse     50 pack years continuing at one halp pack daily  . COPD (chronic obstructive pulmonary disease)     mild ;excercise induced hypoxemia by cp stress test ;asthma ,bronchitis,  . Restless leg syndrome   . Anxiety and depression   . GERD (gastroesophageal reflux disease)   . Inflammatory polyps of colon with rectal bleeding   . Carcinoma in situ of colon 2004    rectal polyp  . H. pylori infection 2004    treated  . Sleep apnea     does not use CPAP  . Gastritis 12/30/10    EGD Dr [name]  . Colitis, ischemic 2011  . Tubular adenoma of colon 06/2009    Colonosocpy Dr [name]  . Leukocytosis     [name]  . Depression   . Anxiety   . Lung nodule 07/16/2011  . CAD (coronary artery disease)   . Diverticulosis   . Hemorrhoids   . Asthma   . Schatzki's ring   . Hiatal hernia    Surgical History: Past Surgical History  Procedure Laterality Date  . Colonoscopy w/ polypectomy  2004    rectal polyp with carcinoma in situ removed via colonoscopy  . Hand surgery      surgical intervention for injury of the fingers of the left hand many years ago  . Colonoscopy  06/2009    normal terminal ileum, segmental mild inflammation of sigmoid colon (bx unremarkable), polyp,  tubular adenoma  . Esophagogastroduodenoscopy  02/2009    query Barrett's but bx negative  . Esophagogastroduodenoscopy  12/30/2010    [name],gastritis, dilated 30F, sm HH, 1 small ulcer, Duodenal erosions, benign bx  . Flexible sigmoidoscopy  12/30/2010     [name],; internal hemorrhoids, anal papilla  . Heart stent    . Nasal septoplasty w/ turbinoplasty  10/06/2011    Procedure: NASAL SEPTOPLASTY  WITH TURBINATE REDUCTION;  Surgeon: Izora Gala, MD;  Location: [facility];  Service: ENT;  Laterality: Bilateral;  . Cardiac catheterization    . Coronary angioplasty with stent placement  01/26/2012    "1; total is now 2"  . Esophagogastroduodenoscopy (egd) with esophageal dilation N/A 09/12/2012    HXT:[id number] Schatzki's ring s/p Maloney dilator. Small hiatal hernia. negative path  . Cholecystectomy  2004  . Colonoscopy N/A 07/31/2013    Dr.Rourk- redundant anal canal hemorrhoids, colonic diverticulosis, tubular adenoma  . Esophagogastroduodenoscopy (egd) with esophageal dilation N/A 07/31/2013    Dr. [name]- normal egd, s/p [facility] dilation empirically. Normal small bowel biopsies   . Hemorrhoid banding      Dr. [name]  . Left heart catheterization with coronary angiogram N/A 01/26/2012    Procedure: LEFT HEART CATHETERIZATION WITH CORONARY ANGIOGRAM;  Surgeon: [name], MD;  Location: [facility] CATH LAB;  Service: Cardiovascular;  Laterality: N/A;  . Percutaneous coronary stent intervention (pci-s) N/A 01/26/2012    Procedure: PERCUTANEOUS CORONARY STENT INTERVENTION (PCI-S);  Surgeon: [name], MD;  Location: [facility] CATH LAB;  Service: Cardiovascular;  Laterality: N/A;   Family History: family history includes Cancer in his maternal aunt and paternal uncle; Depression in his mother; Heart disease in his mother; Hypertension in his brother; Kidney failure in his maternal uncle; Lung disease in his father. There is no history of Colon cancer, ADD / ADHD, Alcohol abuse, Drug abuse,  Anxiety disorder, Bipolar disorder, Dementia, OCD, Paranoid behavior, Schizophrenia, Physical abuse, Sexual abuse, Seizures, or Colon cancer. Reviewed and nothing new today.  Suicidal Ideation:  Pt denies Homicidal Ideation:  Pt has some struggles with his daughter's boy friend.  He stays away from him. AEB (as evidenced by):per pt report  Psychiatric Specialty Exam: ROS Neuro: no headaches, ataxia, weakness, gives out easily GI: no N/V/cramps/constipation, he keeps diarrhea for the past 6 or 7 years MS: no weakness or aches. He catches cramps really bad.   Blood pressure 130/84, pulse 61, height 5\' 10"  (1.778 m), weight 173 lb 3.2 oz (78.563 kg), SpO2 95 %.Body mass index is 24.85 kg/(m^2).  General Appearance: Casual  Eye Contact::  Fair  Speech:  Clear and Coherent  Volume:  Normal  Mood: worried   Affect: Dysphoric   Thought Process:  Coherent, Linear and Logical  Orientation:  Full (Time, Place, and Person)  Thought Content:  Within normal limits   Suicidal Thoughts:  no  Homicidal Thoughts:  No  Memory:  Immediate;   Fair Recent;   Fair Remote;   Fair  Judgement:  Fair  Insight:  Fair  Psychomotor Activity:  Normal  Concentration:  Fair  Recall:  Fair  Akathisia:  No  Handed:  Right  AIMS (if indicated):     Assets:  Communication Skills Desire for Improvement  Sleep:      Current Medications: Zyprexa 10 mg at bed time Xanax 1 mg TID Neurontin 600 mg 3 times a day Prozac 40 mg per day Lab Results: No results found for this or any previous visit (from the past 48 hour(s)). Marland Kitchen  Physical Findings: AIMS:  , ,  ,  ,    CIWA:    COWS:     Treatment Plan Summary: Medication management  Plan: I took his vitals.  I reviewed CC, tobacco/med/surg Hx, meds effects/ side effects, problem list, therapies and responses as well as current situation/symptoms discussed options. He will continue Prozac for depression but increase the dose to 60 mg daily, Zyprexa  for mood  stabilization and Xanax for anxiety He'll return to see me in 6 weeks but call if symptoms worsen before that See orders and pt instructions for more details.  MEDICATIONS this encounter: Meds ordered this encounter  Medications  . OLANZapine (ZYPREXA) 10 MG tablet    Sig: Take 1 tablet (10 mg total) by mouth at bedtime.    Dispense:  30 tablet    Refill:  2  . FLUoxetine (PROZAC) 40 MG capsule    Sig: Take 1 capsule (40 mg total) by mouth daily.    Dispense:  30 capsule    Refill:  2  . FLUoxetine (PROZAC) 20 MG capsule    Sig: Take 1 capsule (20 mg total) by mouth daily.    Dispense:  30 capsule    Refill:  2  . ALPRAZolam (XANAX) 1 MG tablet    Sig: Take 1 tablet (1 mg total) by mouth 4 (four) times daily.    Dispense:  120 tablet    Refill:  2    Medical Decision Making Problem Points:  Established problem, stable/improving (1), Review of last therapy session (1) and Review of psycho-social stressors (1) Data Points:  Review or order clinical lab tests (1) Review of medication regiment & side effects (2)  I certify that outpatient services furnished can reasonably be expected to improve the patient's condition.   ROSS, [facility] 03/13/2015, 10:07 AM

## 2015-04-10 ENCOUNTER — Other Ambulatory Visit: Payer: Self-pay | Admitting: Family Medicine

## 2015-04-11 ENCOUNTER — Other Ambulatory Visit: Payer: Self-pay | Admitting: *Deleted

## 2015-04-11 MED ORDER — ATORVASTATIN CALCIUM 80 MG PO TABS: 80.0000 mg | ORAL_TABLET | Freq: Every day | ORAL | Status: DC

## 2015-04-23 ENCOUNTER — Ambulatory Visit (INDEPENDENT_AMBULATORY_CARE_PROVIDER_SITE_OTHER): Payer: Medicare Other | Admitting: Family Medicine

## 2015-04-23 ENCOUNTER — Encounter: Payer: Self-pay | Admitting: Family Medicine

## 2015-04-23 VITALS — BP 132/88 | Ht 70.0 in | Wt 175.0 lb

## 2015-04-23 DIAGNOSIS — E785 Hyperlipidemia, unspecified: Secondary | ICD-10-CM | POA: Diagnosis not present

## 2015-04-23 DIAGNOSIS — G894 Chronic pain syndrome: Secondary | ICD-10-CM | POA: Diagnosis not present

## 2015-04-23 DIAGNOSIS — R739 Hyperglycemia, unspecified: Secondary | ICD-10-CM

## 2015-04-23 DIAGNOSIS — I10 Essential (primary) hypertension: Secondary | ICD-10-CM

## 2015-04-23 DIAGNOSIS — Z23 Encounter for immunization: Secondary | ICD-10-CM

## 2015-04-23 MED ORDER — HYDROCODONE-ACETAMINOPHEN 7.5-325 MG PO TABS: 1.0000 | ORAL_TABLET | Freq: Three times a day (TID) | ORAL | Status: DC | PRN

## 2015-04-23 MED ORDER — ATORVASTATIN CALCIUM 80 MG PO TABS: 80.0000 mg | ORAL_TABLET | Freq: Every day | ORAL | Status: DC

## 2015-04-23 MED ORDER — BUDESONIDE-FORMOTEROL FUMARATE 80-4.5 MCG/ACT IN AERO: 2.0000 | INHALATION_SPRAY | Freq: Two times a day (BID) | RESPIRATORY_TRACT | Status: DC

## 2015-04-23 NOTE — Progress Notes (Signed)
Subjective:    Patient ID: [name], male    DOB: 04-01-1956, 59 y.o.   MRN: [id number]  HPI This patient was seen today for chronic pain  The medication list was reviewed and updated.   -Compliance with medication: yes  - Number patient states they take daily: 2 - 3  -when was the last dose patient took? This am  The patient was advised the importance of maintaining medication and not using illegal substances with these.  Refills needed: yes  The patient was educated that we can provide 3 monthly scripts for their medication, it is their responsibility to follow the instructions.  Side effects or complications from medications: none  Patient is aware that pain medications are meant to minimize the severity of the pain to allow their pain levels to improve to allow for better function. They are aware of that pain medications cannot totally remove their pain.  Due for UDT ( at least once per year) : done 10/23/14  No concerns today.   Flu vaccine.  The patient relates that he is doing good job taking his cholesterol medicine. He states he does need refills. We did a cholesterol profile earlier this year and it looked acceptable. Patient is trying to watch his diet Patient states under good care with a psychiatrist he does take his nerve medication 3-4 times per day but he states he avoids taking the pain medicine and nervous and at bedtime. He understands and taking both together at bedtime increases risk of death Patient does relate he takes his blood pressure medicine on a regular basis and tries to be healthy with eating He does smoke he knows he needs to quit he states he only smokes 2 or 3 cigarettes every couple days he states his breathing overall doing well but he needs a refill on Symbicort Patient states restless legs are hanging in there not having any significant trouble does not need medication refill currently  Patient does try to be careful with starches and  sugars in his diet.   Review of Systems  Constitutional: Negative for activity change, appetite change and fatigue.  HENT: Negative for congestion.   Respiratory: Negative for cough.   Cardiovascular: Negative for chest pain.  Gastrointestinal: Negative for abdominal pain.  Endocrine: Negative for polydipsia and polyphagia.  Neurological: Negative for weakness.  Psychiatric/Behavioral: Negative for confusion.       Objective:   Physical Exam  Constitutional: He appears well-nourished. No distress.  Cardiovascular: Normal rate, regular rhythm and normal heart sounds.   No murmur heard. Pulmonary/Chest: Effort normal and breath sounds normal. No respiratory distress.  Musculoskeletal: He exhibits no edema.  Lymphadenopathy:    He has no cervical adenopathy.  Neurological: He is alert.  Psychiatric: His behavior is normal.  Vitals reviewed.         Assessment & Plan:  Hyperlipidemia continue medication check lab work next visit Hypertension blood pressure good control no sign of coronary artery disease currently Patient counseled to quit smoking, flu vaccine today, resume Symbicort twice daily, stop Combivent, albuterol when necessary Pain medication 3 times a day when necessary not for bedtime use The patient was seen today as part of a comprehensive visit regarding pain control. Patient's compliance with the medication as well as discussion regarding effectiveness was completed. Prescriptions were written. Patient was advised to follow-up in 3 months. The patient was assessed for any signs of severe side effects. The patient was advised to take the medicine as  directed and to report to [place] if any side effect issues. Minimize starches in diet regular exercise recommended follow-up in approximately 3 months. Comprehensive lab work on next follow-up 25 minutes was spent with the patient. Greater than half the time was spent in discussion and answering questions and counseling  regarding the issues that the patient came in for today.

## 2015-04-24 ENCOUNTER — Ambulatory Visit (INDEPENDENT_AMBULATORY_CARE_PROVIDER_SITE_OTHER): Payer: Medicare Other | Admitting: Psychiatry

## 2015-04-24 ENCOUNTER — Encounter (HOSPITAL_COMMUNITY): Payer: Self-pay | Admitting: Psychiatry

## 2015-04-24 VITALS — BP 125/71 | HR 74 | Ht 70.0 in | Wt 177.8 lb

## 2015-04-24 DIAGNOSIS — F419 Anxiety disorder, unspecified: Secondary | ICD-10-CM | POA: Diagnosis not present

## 2015-04-24 DIAGNOSIS — F418 Other specified anxiety disorders: Secondary | ICD-10-CM

## 2015-04-24 DIAGNOSIS — F332 Major depressive disorder, recurrent severe without psychotic features: Secondary | ICD-10-CM

## 2015-04-24 DIAGNOSIS — F17200 Nicotine dependence, unspecified, uncomplicated: Secondary | ICD-10-CM

## 2015-04-24 DIAGNOSIS — F5105 Insomnia due to other mental disorder: Secondary | ICD-10-CM

## 2015-04-24 MED ORDER — ALPRAZOLAM 1 MG PO TABS: 1.0000 mg | ORAL_TABLET | Freq: Four times a day (QID) | ORAL | Status: DC

## 2015-04-24 MED ORDER — OLANZAPINE 10 MG PO TABS: 10.0000 mg | ORAL_TABLET | Freq: Every day | ORAL | Status: DC

## 2015-04-24 MED ORDER — GABAPENTIN 300 MG PO CAPS: 600.0000 mg | ORAL_CAPSULE | Freq: Three times a day (TID) | ORAL | Status: DC

## 2015-04-24 MED ORDER — FLUOXETINE HCL 40 MG PO CAPS: 40.0000 mg | ORAL_CAPSULE | Freq: Every day | ORAL | Status: DC

## 2015-04-24 MED ORDER — FLUOXETINE HCL 20 MG PO CAPS: 20.0000 mg | ORAL_CAPSULE | Freq: Every day | ORAL | Status: DC

## 2015-04-24 NOTE — Progress Notes (Signed)
Patient ID: [name], male   DOB: 1956/03/04, 59 y.o.   MRN: [id number] Patient ID: [name], male   DOB: Jun 09, 1956, 59 y.o.   MRN: [id number] Patient ID: [name], male   DOB: [birth year], 59 y.o.   MRN: [id number] Patient ID: [name], male   DOB: [birth year], 59 y.o.   MRN: [id number] Patient ID: [name], male   DOB: July 27, 1955, 59 y.o.   MRN: [id number] Patient ID: [name], male   DOB: 02/27/1956, 59 y.o.   MRN: [id number] Patient ID: [name], male   DOB: [birth year], 59 y.o.   MRN: [id number] Patient ID: [name], male   DOB: 1956/01/30, 59 y.o.   MRN: [id number] Patient ID: [name], male   DOB: 1956-04-06, 59 y.o.   MRN: [id number] Patient ID: [name], male   DOB: 10/14/1955, 59 y.o.   MRN: [id number] Patient ID: [name], male   DOB: 06/10/1956, 59 y.o.   MRN: [id number] [place] [id number] Progress Note [name] MRN: [id number] DOB: 28-Oct-1955 Age: 59 y.o.  Date: 04/24/2015 Start Time: 9:50 AM End Time: 10:05 AM  Chief Complaint: Chief Complaint  Patient presents with  . Depression  . Anxiety  . Follow-up    Subjective: "I'm doing a little better"  This patient is a 59 year old married white male lives with his wife in [place]. He is on disability for coronary artery disease and COPD. He used to work for the [employer] but has not worked for the last 6 years. He has 3 step children 1 adopted child and several grandchildren.  The patient states he's been depressed and anxious primarily since he stopped working 6 years ago. He's also been through several deaths in his family his brother died suddenly 4 years ago. Following that his sister-in-law died in his home 8 months after his brother died. The patient's wife was caring for his wife's mother and she died last month at the age of 62. All of these things are disturbing to him. He's had trouble sleeping because he is thinking about the deaths. He's more depressed but not  suicidal. He's not eating well and has lost about 20 pounds. He also has restless legs and chronic pain in his legs which keep him awake at night. He enjoys hunting and fishing but is less these interests recently.  The patient returns after 6 weeks. Last time he was feeling more depressed and I increased his Prozac. His mood is been better and he's been getting and  doing more things on his property. His energy has improved. He denies suicidal ideation  Diagnosis:   Axis I: Anxiety Disorder NOS and Major Depression, Recurrent severe Axis II: Deferred Axis III:  Past Medical History  Diagnosis Date  . ASCVD (arteriosclerotic cardiovascular disease)     inferior MI with circumflex PTCA in 1993;cath in 2000-50% OM 1&2,30% LAD ;2008-25% LAD, 80% nondominant right ,60% small OM 2,EF of 55% with severe basilar inferior hypokinesis; stress nuclear study in 04/2008 moderate inferior scar with peri-infarction ischemic  . Syncope   . Hyperlipidemia   . Hypertension   . Tobacco abuse     50 pack years continuing at one halp pack daily  . COPD (chronic obstructive pulmonary disease) (HCC)     mild ;excercise induced hypoxemia by cp stress test ;asthma ,bronchitis,  . Restless leg syndrome   . Anxiety and depression   . GERD (gastroesophageal reflux disease)   .  Inflammatory polyps of colon with rectal bleeding ([place])   . Carcinoma in situ of colon 2004    rectal polyp  . H. pylori infection 2004    treated  . Sleep apnea     does not use CPAP  . Gastritis 12/30/10    EGD Dr [name]  . Colitis, ischemic ([place]) 2011  . Tubular adenoma of colon 06/2009    Colonosocpy Dr [name]  . Leukocytosis     [name]  . Depression   . Anxiety   . Lung nodule 07/16/2011  . CAD (coronary artery disease)   . Diverticulosis   . Hemorrhoids   . Asthma   . Schatzki's ring   . Hiatal hernia    Axis IV: other psychosocial or environmental problems Axis V: 41-50 serious symptoms  ADL's:   Intact  Sleep: Fair  Appetite:  Poor  Allergies: No Known Allergies Medical History: Past Medical History  Diagnosis Date  . ASCVD (arteriosclerotic cardiovascular disease)     inferior MI with circumflex PTCA in 1993;cath in 2000-50% OM 1&2,30% LAD ;2008-25% LAD, 80% nondominant right ,60% small OM 2,EF of 55% with severe basilar inferior hypokinesis; stress nuclear study in 04/2008 moderate inferior scar with peri-infarction ischemic  . Syncope   . Hyperlipidemia   . Hypertension   . Tobacco abuse     50 pack years continuing at one halp pack daily  . COPD (chronic obstructive pulmonary disease) (HCC)     mild ;excercise induced hypoxemia by cp stress test ;asthma ,bronchitis,  . Restless leg syndrome   . Anxiety and depression   . GERD (gastroesophageal reflux disease)   . Inflammatory polyps of colon with rectal bleeding ([place])   . Carcinoma in situ of colon 2004    rectal polyp  . H. pylori infection 2004    treated  . Sleep apnea     does not use CPAP  . Gastritis 12/30/10    EGD Dr [name]  . Colitis, ischemic ([place]) 2011  . Tubular adenoma of colon 06/2009    Colonosocpy Dr [name]  . Leukocytosis     [name]  . Depression   . Anxiety   . Lung nodule 07/16/2011  . CAD (coronary artery disease)   . Diverticulosis   . Hemorrhoids   . Asthma   . Schatzki's ring   . Hiatal hernia    Surgical History: Past Surgical History  Procedure Laterality Date  . Colonoscopy w/ polypectomy  2004    rectal polyp with carcinoma in situ removed via colonoscopy  . Hand surgery      surgical intervention for injury of the fingers of the left hand many years ago  . Colonoscopy  06/2009    normal terminal ileum, segmental mild inflammation of sigmoid colon (bx unremarkable), polyp, tubular adenoma  . Esophagogastroduodenoscopy  02/2009    query Barrett's but bx negative  . Esophagogastroduodenoscopy  12/30/2010    [name],gastritis, dilated 28F, sm HH, 1 small ulcer,  Duodenal erosions, benign bx  . Flexible sigmoidoscopy  12/30/2010     [name],; internal hemorrhoids, anal papilla  . Heart stent    . Nasal septoplasty w/ turbinoplasty  10/06/2011    Procedure: NASAL SEPTOPLASTY WITH TURBINATE REDUCTION;  Surgeon: Izora Gala, MD;  Location: [facility];  Service: ENT;  Laterality: Bilateral;  . Cardiac catheterization    . Coronary angioplasty with stent placement  01/26/2012    "1; total is now 2"  . Esophagogastroduodenoscopy (  egd) with esophageal dilation N/A 09/12/2012    [id number] Schatzki's ring s/p Maloney dilator. Small hiatal hernia. negative path  . Cholecystectomy  2004  . Colonoscopy N/A 07/31/2013    Dr.Rourk- redundant anal canal hemorrhoids, colonic diverticulosis, tubular adenoma  . Esophagogastroduodenoscopy (egd) with esophageal dilation N/A 07/31/2013    Dr. [name]- normal egd, s/p [facility] dilation empirically. Normal small bowel biopsies   . Hemorrhoid banding      Dr. [name]  . Left heart catheterization with coronary angiogram N/A 01/26/2012    Procedure: LEFT HEART CATHETERIZATION WITH CORONARY ANGIOGRAM;  Surgeon: [name], MD;  Location: [facility] CATH LAB;  Service: Cardiovascular;  Laterality: N/A;  . Percutaneous coronary stent intervention (pci-s) N/A 01/26/2012    Procedure: PERCUTANEOUS CORONARY STENT INTERVENTION (PCI-S);  Surgeon: [name], MD;  Location: [facility] CATH LAB;  Service: Cardiovascular;  Laterality: N/A;   Family History: family history includes Cancer in his maternal aunt and paternal uncle; Depression in his mother; Heart disease in his mother; Hypertension in his brother; Kidney failure in his maternal uncle; Lung disease in his father. There is no history of Colon cancer, ADD / ADHD, Alcohol abuse, Drug abuse, Anxiety disorder, Bipolar disorder, Dementia, OCD, Paranoid behavior, Schizophrenia, Physical abuse, Sexual abuse, Seizures, or Colon cancer. Reviewed and nothing new today.  Suicidal Ideation:  Pt  denies Homicidal Ideation:  Pt has some struggles with his daughter's boy friend.  He stays away from him. AEB (as evidenced by):per pt report  Psychiatric Specialty Exam: ROS Neuro: no headaches, ataxia, weakness, gives out easily GI: no N/V/cramps/constipation, he keeps diarrhea for the past 6 or 7 years MS: no weakness or aches. He catches cramps really bad.   Blood pressure 125/71, pulse 74, height 5\' 10"  (1.778 m), weight 177 lb 12.8 oz (80.65 kg), SpO2 95 %.Body mass index is 25.51 kg/(m^2).  General Appearance: Casual  Eye Contact::  Fair  Speech:  Clear and Coherent  Volume:  Normal  Mood: Improved   Affect: Brighter   Thought Process:  Coherent, Linear and Logical  Orientation:  Full (Time, Place, and Person)  Thought Content:  Within normal limits   Suicidal Thoughts:  no  Homicidal Thoughts:  No  Memory:  Immediate;   Fair Recent;   Fair Remote;   Fair  Judgement:  Fair  Insight:  Fair  Psychomotor Activity:  Normal  Concentration:  Fair  Recall:  Fair  Akathisia:  No  Handed:  Right  AIMS (if indicated):     Assets:  Communication Skills Desire for Improvement  Sleep:      Current Medications: Zyprexa 10 mg at bed time Xanax 1 mg TID Neurontin 600 mg 3 times a day Prozac 40 mg per day Lab Results: No results found for this or any previous visit (from the past 48 hour(s)). Marland Kitchen  Physical Findings: AIMS:  , ,  ,  ,    CIWA:    COWS:     Treatment Plan Summary: Medication management  Plan: I took his vitals.  I reviewed CC, tobacco/med/surg Hx, meds effects/ side effects, problem list, therapies and responses as well as current situation/symptoms discussed options. He will continue Prozac  60 mg daily, Zyprexa for mood stabilization and Neurontin and Xanax for anxiety He'll return to see me in 3 months but call if symptoms worsen before that See orders and pt instructions for more details.  MEDICATIONS this encounter: Meds ordered this encounter   Medications  . gabapentin (NEURONTIN) 300 MG capsule  Sig: Take 2 capsules (600 mg total) by mouth 3 (three) times daily.    Dispense:  180 capsule    Refill:  2  . FLUoxetine (PROZAC) 20 MG capsule    Sig: Take 1 capsule (20 mg total) by mouth daily.    Dispense:  30 capsule    Refill:  2  . FLUoxetine (PROZAC) 40 MG capsule    Sig: Take 1 capsule (40 mg total) by mouth daily.    Dispense:  30 capsule    Refill:  2  . OLANZapine (ZYPREXA) 10 MG tablet    Sig: Take 1 tablet (10 mg total) by mouth at bedtime.    Dispense:  30 tablet    Refill:  2  . ALPRAZolam (XANAX) 1 MG tablet    Sig: Take 1 tablet (1 mg total) by mouth 4 (four) times daily.    Dispense:  120 tablet    Refill:  2    Medical Decision Making Problem Points:  Established problem, stable/improving (1), Review of last therapy session (1) and Review of psycho-social stressors (1) Data Points:  Review or order clinical lab tests (1) Review of medication regiment & side effects (2)  I certify that outpatient services furnished can reasonably be expected to improve the patient's condition.   ROSS, [facility] 04/24/2015, 10:44 AM

## 2015-05-12 ENCOUNTER — Telehealth: Payer: Self-pay

## 2015-05-12 ENCOUNTER — Other Ambulatory Visit: Payer: Self-pay | Admitting: Family Medicine

## 2015-05-12 MED ORDER — ATORVASTATIN CALCIUM 80 MG PO TABS: 80.0000 mg | ORAL_TABLET | Freq: Every day | ORAL | Status: DC

## 2015-05-12 NOTE — Telephone Encounter (Signed)
SENT IN RX 

## 2015-06-02 ENCOUNTER — Encounter: Payer: Medicare Other | Admitting: Cardiology

## 2015-06-02 NOTE — Progress Notes (Signed)
ERROR. Cancelled appt 

## 2015-07-08 ENCOUNTER — Encounter: Payer: Self-pay | Admitting: Internal Medicine

## 2015-07-16 ENCOUNTER — Other Ambulatory Visit: Payer: Self-pay | Admitting: *Deleted

## 2015-07-16 ENCOUNTER — Other Ambulatory Visit: Payer: Self-pay

## 2015-07-16 MED ORDER — ROPINIROLE HCL 0.5 MG PO TABS: 0.5000 mg | ORAL_TABLET | Freq: Every day | ORAL | Status: DC

## 2015-07-16 MED ORDER — METOPROLOL SUCCINATE ER 25 MG PO TB24: 25.0000 mg | ORAL_TABLET | Freq: Every day | ORAL | Status: DC

## 2015-07-16 MED ORDER — LISINOPRIL 5 MG PO TABS: 5.0000 mg | ORAL_TABLET | Freq: Every day | ORAL | Status: DC

## 2015-07-17 MED ORDER — PANTOPRAZOLE SODIUM 40 MG PO TBEC: 40.0000 mg | DELAYED_RELEASE_TABLET | Freq: Two times a day (BID) | ORAL | Status: DC

## 2015-07-21 ENCOUNTER — Other Ambulatory Visit (HOSPITAL_COMMUNITY): Payer: Self-pay | Admitting: Psychiatry

## 2015-07-21 ENCOUNTER — Telehealth (HOSPITAL_COMMUNITY): Payer: Self-pay | Admitting: *Deleted

## 2015-07-21 DIAGNOSIS — F17200 Nicotine dependence, unspecified, uncomplicated: Secondary | ICD-10-CM

## 2015-07-21 DIAGNOSIS — F418 Other specified anxiety disorders: Secondary | ICD-10-CM

## 2015-07-21 DIAGNOSIS — F5105 Insomnia due to other mental disorder: Secondary | ICD-10-CM

## 2015-07-21 MED ORDER — OLANZAPINE 10 MG PO TABS: 10.0000 mg | ORAL_TABLET | Freq: Every day | ORAL | Status: DC

## 2015-07-21 MED ORDER — GABAPENTIN 300 MG PO CAPS: 600.0000 mg | ORAL_CAPSULE | Freq: Three times a day (TID) | ORAL | Status: DC

## 2015-07-21 NOTE — Telephone Encounter (Signed)
sent 

## 2015-07-21 NOTE — Telephone Encounter (Signed)
Pt pharmacy requesting refills for his Olanzapine and Gabapentin. Both medications last filled 04-24-15 with 2 refills. Pt f/u appt is for 07-25-15. Pt pharmacy number is [phone].

## 2015-07-21 NOTE — Telephone Encounter (Signed)
noted 

## 2015-07-24 ENCOUNTER — Ambulatory Visit (INDEPENDENT_AMBULATORY_CARE_PROVIDER_SITE_OTHER): Payer: BC Managed Care – PPO | Admitting: Family Medicine

## 2015-07-24 ENCOUNTER — Encounter: Payer: Self-pay | Admitting: Family Medicine

## 2015-07-24 VITALS — BP 100/62 | Ht 70.0 in | Wt 171.1 lb

## 2015-07-24 DIAGNOSIS — R739 Hyperglycemia, unspecified: Secondary | ICD-10-CM | POA: Diagnosis not present

## 2015-07-24 DIAGNOSIS — Z125 Encounter for screening for malignant neoplasm of prostate: Secondary | ICD-10-CM

## 2015-07-24 DIAGNOSIS — J438 Other emphysema: Secondary | ICD-10-CM | POA: Diagnosis not present

## 2015-07-24 DIAGNOSIS — G894 Chronic pain syndrome: Secondary | ICD-10-CM

## 2015-07-24 DIAGNOSIS — K219 Gastro-esophageal reflux disease without esophagitis: Secondary | ICD-10-CM

## 2015-07-24 DIAGNOSIS — E785 Hyperlipidemia, unspecified: Secondary | ICD-10-CM | POA: Diagnosis not present

## 2015-07-24 DIAGNOSIS — G2581 Restless legs syndrome: Secondary | ICD-10-CM | POA: Diagnosis not present

## 2015-07-24 DIAGNOSIS — I10 Essential (primary) hypertension: Secondary | ICD-10-CM

## 2015-07-24 MED ORDER — METOPROLOL SUCCINATE ER 25 MG PO TB24: 25.0000 mg | ORAL_TABLET | Freq: Every day | ORAL | Status: DC

## 2015-07-24 MED ORDER — HYDROCODONE-ACETAMINOPHEN 7.5-325 MG PO TABS: 1.0000 | ORAL_TABLET | Freq: Three times a day (TID) | ORAL | Status: DC | PRN

## 2015-07-24 MED ORDER — LISINOPRIL 5 MG PO TABS: 5.0000 mg | ORAL_TABLET | Freq: Every day | ORAL | Status: DC

## 2015-07-24 MED ORDER — ROPINIROLE HCL 0.5 MG PO TABS: 0.5000 mg | ORAL_TABLET | Freq: Every day | ORAL | Status: DC

## 2015-07-24 NOTE — Patient Instructions (Signed)
As part of your visit today we have covered your chronic pain. You have been given prescription(s) for pain medicines.The DEA and State Medical Board require that any patient on pain medications must be seen every 3 months. You are expected to come in for a office visit before further pain medications are issued.  Since we are managing your pain do not get pain scripts from other doctors. We check the prescription registry regularly. If you are receiving pain medicines from another source we will STOP prescribing pain medicines.   We will not refill medications or early nor will we give an extended month supply at the end of these prescriptions.It is your responsibility to keep up with medications. They will not be replaced.  It is your responsibility to schedule an office visit in 3 months to be seen before you are out of your medication. Do not call our office to request early refills or additional refills. Do not wait till the last moment to schedule the follow up visit. We highly recommend you schedule this now for 3 months.  We believe that most patients take their meds as prescribed but drug misuse and diversion is a serious problem in the [place]. Our office does standard measures to insure proper care to all. All patients are subject to random urine drug screens/ saliva tests and random pill counts. Also all patients drug prescription records are reviewed on a regular basis in accordance with State medical board policies.  Remember, do not use alcohol or illegal drugs with your pain medications. If you are feeling drowsy or affected by your medicine you are not to operate any machinery , do any dangerous activities or drive while this is occurring.   We are required by law to adhere to strict regulations. Failure on our part to follow these regulations could jeopardize our prescription license which in turn would cause us not to be able to care for you.Thank you for your understanding and following  these policies. 

## 2015-07-24 NOTE — Progress Notes (Signed)
   Subjective:    Patient ID: [name], male    DOB: [birth year], 60 y.o.   MRN: [id number]  HPI This patient was seen today for chronic pain  The medication list was reviewed and updated.   -Compliance with medication: yes  - Number patient states they take daily:  2-3 daily   -when was the last dose patient took: last night   The patient was advised the importance of maintaining medication and not using illegal substances with these.  Refills needed: yes  The patient was educated that we can provide 3 monthly scripts for their medication, it is their responsibility to follow the instructions.  Side effects or complications from medications: none  Patient is aware that pain medications are meant to minimize the severity of the pain to allow their pain levels to improve to allow for better function. They are aware of that pain medications cannot totally remove their pain.  Due for UDT ( at least once per year) : utd  Patient has no other concerns at this time.   Long discussion held regarding his medications I went over each single 101 over the condition 0 associated with. He feels overall he is doing fairly good he states he could be doing a little bit more activity. States medications working ways appearing tendon and denies any side effects.     25 minutes was spent with the patient. Greater than half the time was spent in discussion and answering questions and counseling regarding the issues that the patient came in for today.    Review of Systems  Constitutional: Negative for activity change, appetite change and fatigue.  HENT: Negative for congestion.   Respiratory: Negative for cough.   Cardiovascular: Negative for chest pain.  Gastrointestinal: Negative for abdominal pain.  Endocrine: Negative for polydipsia and polyphagia.  Neurological: Negative for weakness.  Psychiatric/Behavioral: Negative for confusion.       Objective:   Physical Exam    Constitutional: He appears well-nourished. No distress.  Cardiovascular: Normal rate, regular rhythm and normal heart sounds.   No murmur heard. Pulmonary/Chest: Effort normal and breath sounds normal. No respiratory distress.  Musculoskeletal: He exhibits no edema.  Lymphadenopathy:    He has no cervical adenopathy.  Neurological: He is alert.  Psychiatric: His behavior is normal.  Vitals reviewed.         Assessment & Plan:  Patient is due for PSA screening lab ordered Chronic pain and discomfort patient uses medicine denies abusing it. Refills given. Follow-up in 3 months regarding this. Patient denies side effects with medicine. Psychiatric illness stable under good control with his psychiatrist Restless legs medication does well according to the patient Lung related issues uses Symbicort uses albuterol when necessary under good control patient occasionally smokes but has been counseled to totally quit Cardiac condition patient denies any chest tightness pressure pain we do need to look at his metabolic 7 continue lisinopril Continue atorvastatin healthy eating regular activity check lipid liver Continue 81 mg aspirin no side effects with it currently if any bleeding problems follow-up

## 2015-07-25 ENCOUNTER — Ambulatory Visit (INDEPENDENT_AMBULATORY_CARE_PROVIDER_SITE_OTHER): Payer: Medicare Other | Admitting: Psychiatry

## 2015-07-25 ENCOUNTER — Encounter (HOSPITAL_COMMUNITY): Payer: Self-pay | Admitting: Psychiatry

## 2015-07-25 VITALS — BP 118/74 | HR 76 | Ht 70.0 in | Wt 166.0 lb

## 2015-07-25 DIAGNOSIS — F17200 Nicotine dependence, unspecified, uncomplicated: Secondary | ICD-10-CM | POA: Diagnosis not present

## 2015-07-25 DIAGNOSIS — F332 Major depressive disorder, recurrent severe without psychotic features: Secondary | ICD-10-CM

## 2015-07-25 DIAGNOSIS — F418 Other specified anxiety disorders: Secondary | ICD-10-CM

## 2015-07-25 DIAGNOSIS — F5105 Insomnia due to other mental disorder: Secondary | ICD-10-CM | POA: Diagnosis not present

## 2015-07-25 DIAGNOSIS — F341 Dysthymic disorder: Secondary | ICD-10-CM | POA: Diagnosis not present

## 2015-07-25 LAB — LIPID PANEL
CHOL/HDL RATIO: 5.9 ratio — AB (ref 0.0–5.0)
CHOLESTEROL TOTAL: 171 mg/dL (ref 100–199)
HDL: 29 mg/dL — AB (ref >39)
LDL CALC: 71 mg/dL (ref 0–99)
TRIGLYCERIDES: 355 mg/dL — AB (ref 0–149)
VLDL CHOLESTEROL CAL: 71 mg/dL — AB (ref 5–40)

## 2015-07-25 LAB — BASIC METABOLIC PANEL
BUN / CREAT RATIO: 15 (ref 9–20)
BUN: 11 mg/dL (ref 6–24)
CO2: 24 mmol/L (ref 18–29)
CREATININE: 0.72 mg/dL — AB (ref 0.76–1.27)
Calcium: 9.8 mg/dL (ref 8.7–10.2)
Chloride: 100 mmol/L (ref 96–106)
GFR, EST AFRICAN AMERICAN: 119 mL/min/{1.73_m2} (ref >59)
GFR, EST NON AFRICAN AMERICAN: 103 mL/min/{1.73_m2} (ref >59)
GLUCOSE: 87 mg/dL (ref 65–99)
Potassium: 4.4 mmol/L (ref 3.5–5.2)
SODIUM: 142 mmol/L (ref 134–144)

## 2015-07-25 LAB — HEPATIC FUNCTION PANEL
ALK PHOS: 112 IU/L (ref 39–117)
ALT: 34 IU/L (ref 0–44)
AST: 16 IU/L (ref 0–40)
Albumin: 4.6 g/dL (ref 3.5–5.5)
BILIRUBIN TOTAL: 0.4 mg/dL (ref 0.0–1.2)
BILIRUBIN, DIRECT: 0.11 mg/dL (ref 0.00–0.40)
TOTAL PROTEIN: 7.1 g/dL (ref 6.0–8.5)

## 2015-07-25 LAB — PSA: Prostate Specific Ag, Serum: 0.9 ng/mL (ref 0.0–4.0)

## 2015-07-25 LAB — HEMOGLOBIN A1C
Est. average glucose Bld gHb Est-mCnc: 105 mg/dL
Hgb A1c MFr Bld: 5.3 % (ref 4.8–5.6)

## 2015-07-25 MED ORDER — OLANZAPINE 10 MG PO TABS: 10.0000 mg | ORAL_TABLET | Freq: Every day | ORAL | Status: DC

## 2015-07-25 MED ORDER — ALPRAZOLAM 1 MG PO TABS: 1.0000 mg | ORAL_TABLET | Freq: Four times a day (QID) | ORAL | Status: DC

## 2015-07-25 MED ORDER — GABAPENTIN 300 MG PO CAPS: 600.0000 mg | ORAL_CAPSULE | Freq: Three times a day (TID) | ORAL | Status: DC

## 2015-07-25 MED ORDER — FLUOXETINE HCL 40 MG PO CAPS: 40.0000 mg | ORAL_CAPSULE | Freq: Every day | ORAL | Status: DC

## 2015-07-25 MED ORDER — FLUOXETINE HCL 20 MG PO CAPS: 20.0000 mg | ORAL_CAPSULE | Freq: Every day | ORAL | Status: DC

## 2015-07-25 NOTE — Progress Notes (Signed)
Patient ID: [name], male   DOB: Sep 26, 1955, 60 y.o.   MRN: [id number] Patient ID: [name], male   DOB: 1956-06-08, 60 y.o.   MRN: [id number] Patient ID: [name], male   DOB: 01/25/1956, 60 y.o.   MRN: [id number] Patient ID: [name], male   DOB: November 15, 1955, 60 y.o.   MRN: [id number] Patient ID: [name], male   DOB: 29-Apr-1956, 60 y.o.   MRN: [id number] Patient ID: [name], male   DOB: 1955/08/04, 60 y.o.   MRN: [id number] Patient ID: [name], male   DOB: 1955-10-16, 60 y.o.   MRN: [id number] Patient ID: [name], male   DOB: 08-Nov-1955, 60 y.o.   MRN: [id number] Patient ID: [name], male   DOB: 1955-11-24, 60 y.o.   MRN: [id number] Patient ID: [name], male   DOB: 30-Nov-1955, 60 y.o.   MRN: [id number] Patient ID: [name], male   DOB: 26-Nov-1955, 60 y.o.   MRN: [id number] Patient ID: [name], male   DOB: Jun 08, 1956, 60 y.o.   MRN: [id number] [name] [id number] Progress Note ERLIN LAWHORN MRN: [id number] DOB: [birth year] Age: 60 y.o.  Date: 07/25/2015 Start Time: 9:50 AM End Time: 10:05 AM  Chief Complaint: Chief Complaint  Patient presents with  . Depression  . Anxiety  . Follow-up    Subjective: "I'm doing a little better"  This patient is a 60 year old married white male lives with his wife in [place]. He is on disability for coronary artery disease and COPD. He used to work for the [employer] but has not worked for the last 6 years. He has 3 step children 1 adopted child and several grandchildren.  The patient states he's been depressed and anxious primarily since he stopped working 6 years ago. He's also been through several deaths in his family his brother died suddenly 4 years ago. Following that his sister-in-law died in his home 8 months after his brother died. The patient's wife was caring for his wife's mother and she died last month at the age of 57. All of these things are disturbing to him. He's had trouble  sleeping because he is thinking about the deaths. He's more depressed but not suicidal. He's not eating well and has lost about 20 pounds. He also has restless legs and chronic pain in his legs which keep him awake at night. He enjoys hunting and fishing but is less these interests recently.  The patient returns after 3 months. He states he is doing somewhat better. His daughter is in jail right now but she is pregnant with a baby boy. More than likely he and his wife will need to take care of the child because of his daughter's chronic substance abuse. He is trying to stay positive and is sleeping well and eating well Diagnosis:   Axis I: Anxiety Disorder NOS and Major Depression, Recurrent severe Axis II: Deferred Axis III:  Past Medical History  Diagnosis Date  . ASCVD (arteriosclerotic cardiovascular disease)     inferior MI with circumflex PTCA in 1993;cath in 2000-50% OM 1&2,30% LAD ;2008-25% LAD, 80% nondominant right ,60% small OM 2,EF of 55% with severe basilar inferior hypokinesis; stress nuclear study in 04/2008 moderate inferior scar with peri-infarction ischemic  . Syncope   . Hyperlipidemia   . Hypertension   . Tobacco abuse     50 pack years continuing at one halp pack daily  . COPD (chronic obstructive  pulmonary disease) ([place])     mild ;excercise induced hypoxemia by cp stress test ;asthma ,bronchitis,  . Restless leg syndrome   . Anxiety and depression   . GERD (gastroesophageal reflux disease)   . Inflammatory polyps of colon with rectal bleeding ([place])   . Carcinoma in situ of colon 2004    rectal polyp  . H. pylori infection 2004    treated  . Sleep apnea     does not use CPAP  . Gastritis 12/30/10    EGD Dr [name]  . Colitis, ischemic ([place]) 2011  . Tubular adenoma of colon 06/2009    Colonosocpy Dr [name]  . Leukocytosis     [name]  . Depression   . Anxiety   . Lung nodule 07/16/2011  . CAD (coronary artery disease)   . Diverticulosis   . Hemorrhoids    . Asthma   . Schatzki's ring   . Hiatal hernia    Axis IV: other psychosocial or environmental problems Axis V: 41-50 serious symptoms  ADL's:  Intact  Sleep: Fair  Appetite:  Poor  Allergies: No Known Allergies Medical History: Past Medical History  Diagnosis Date  . ASCVD (arteriosclerotic cardiovascular disease)     inferior MI with circumflex PTCA in 1993;cath in 2000-50% OM 1&2,30% LAD ;2008-25% LAD, 80% nondominant right ,60% small OM 2,EF of 55% with severe basilar inferior hypokinesis; stress nuclear study in 04/2008 moderate inferior scar with peri-infarction ischemic  . Syncope   . Hyperlipidemia   . Hypertension   . Tobacco abuse     50 pack years continuing at one halp pack daily  . COPD (chronic obstructive pulmonary disease) ([facility])     mild ;excercise induced hypoxemia by cp stress test ;asthma ,bronchitis,  . Restless leg syndrome   . Anxiety and depression   . GERD (gastroesophageal reflux disease)   . Inflammatory polyps of colon with rectal bleeding ([place])   . Carcinoma in situ of colon 2004    rectal polyp  . H. pylori infection 2004    treated  . Sleep apnea     does not use CPAP  . Gastritis 12/30/10    EGD Dr [name]  . Colitis, ischemic ([place]) 2011  . Tubular adenoma of colon 06/2009    Colonosocpy Dr [name]  . Leukocytosis     [name]  . Depression   . Anxiety   . Lung nodule 07/16/2011  . CAD (coronary artery disease)   . Diverticulosis   . Hemorrhoids   . Asthma   . Schatzki's ring   . Hiatal hernia    Surgical History: Past Surgical History  Procedure Laterality Date  . Colonoscopy w/ polypectomy  2004    rectal polyp with carcinoma in situ removed via colonoscopy  . Hand surgery      surgical intervention for injury of the fingers of the left hand many years ago  . Colonoscopy  06/2009    normal terminal ileum, segmental mild inflammation of sigmoid colon (bx unremarkable), polyp, tubular adenoma  . Esophagogastroduodenoscopy   02/2009    query Barrett's but bx negative  . Esophagogastroduodenoscopy  12/30/2010    [name],gastritis, dilated 63F, sm HH, 1 small ulcer, Duodenal erosions, benign bx  . Flexible sigmoidoscopy  12/30/2010     [name],; internal hemorrhoids, anal papilla  . Heart stent    . Nasal septoplasty w/ turbinoplasty  10/06/2011    Procedure: NASAL SEPTOPLASTY WITH TURBINATE REDUCTION;  Surgeon: Garret Reddish  Constance Holster, MD;  Location: [facility];  Service: ENT;  Laterality: Bilateral;  . Cardiac catheterization    . Coronary angioplasty with stent placement  01/26/2012    "1; total is now 2"  . Esophagogastroduodenoscopy (egd) with esophageal dilation N/A 09/12/2012    [id number] Schatzki's ring s/p Maloney dilator. Small hiatal hernia. negative path  . Cholecystectomy  2004  . Colonoscopy N/A 07/31/2013    Dr.Rourk- redundant anal canal hemorrhoids, colonic diverticulosis, tubular adenoma  . Esophagogastroduodenoscopy (egd) with esophageal dilation N/A 07/31/2013    Dr. [name]- normal egd, s/p [facility] dilation empirically. Normal small bowel biopsies   . Hemorrhoid banding      Dr. [name]  . Left heart catheterization with coronary angiogram N/A 01/26/2012    Procedure: LEFT HEART CATHETERIZATION WITH CORONARY ANGIOGRAM;  Surgeon: [name], MD;  Location: [facility] CATH LAB;  Service: Cardiovascular;  Laterality: N/A;  . Percutaneous coronary stent intervention (pci-s) N/A 01/26/2012    Procedure: PERCUTANEOUS CORONARY STENT INTERVENTION (PCI-S);  Surgeon: [name], MD;  Location: [facility] CATH LAB;  Service: Cardiovascular;  Laterality: N/A;   Family History: family history includes Cancer in his maternal aunt and paternal uncle; Depression in his mother; Heart disease in his mother; Hypertension in his brother; Kidney failure in his maternal uncle; Lung disease in his father. There is no history of Colon cancer, ADD / ADHD, Alcohol abuse, Drug abuse, Anxiety disorder, Bipolar disorder, Dementia, OCD,  Paranoid behavior, Schizophrenia, Physical abuse, Sexual abuse, Seizures, or Colon cancer. Reviewed and nothing new today.  Suicidal Ideation:  Pt denies Homicidal Ideation:  Pt has some struggles with his daughter's boy friend.  He stays away from him. AEB (as evidenced by):per pt report  Psychiatric Specialty Exam: ROS Neuro: no headaches, ataxia, weakness, gives out easily GI: no N/V/cramps/constipation, he keeps diarrhea for the past 6 or 7 years MS: no weakness or aches. He catches cramps really bad.   Blood pressure 118/74, pulse 76, height 5\' 10"  (1.778 m), weight 166 lb (75.297 kg), SpO2 96 %.Body mass index is 23.82 kg/(m^2).  General Appearance: Casual  Eye Contact::  Fair  Speech:  Clear and Coherent  Volume:  Normal  Mood: good  Affect: Brighter   Thought Process:  Coherent, Linear and Logical  Orientation:  Full (Time, Place, and Person)  Thought Content:  Within normal limits   Suicidal Thoughts:  no  Homicidal Thoughts:  No  Memory:  Immediate;   Fair Recent;   Fair Remote;   Fair  Judgement:  Fair  Insight:  Fair  Psychomotor Activity:  Normal  Concentration:  Fair  Recall:  Fair  Akathisia:  No  Handed:  Right  AIMS (if indicated):     Assets:  Communication Skills Desire for Improvement  Sleep:      Current Medications: Zyprexa 10 mg at bed time Xanax 1 mg TID Neurontin 600 mg 3 times a day Prozac 40 mg per day Lab Results:  Results for orders placed or performed in visit on 07/24/15 (from the past 48 hour(s))  Lipid panel     Status: Abnormal   Collection Time: 07/24/15  8:40 AM  Result Value Ref Range   Cholesterol, Total 171 100 - 199 mg/dL   Triglycerides 355 (H) 0 - 149 mg/dL   HDL 29 (L) >39 mg/dL   VLDL Cholesterol Cal 71 (H) 5 - 40 mg/dL   LDL Calculated 71 0 - 99 mg/dL   Chol/HDL Ratio 5.9 (H) 0.0 - 5.0 ratio units  Comment:                                   T. Chol/HDL Ratio                                             Men   Women                               1/2 Avg.Risk  3.4    3.3                                   Avg.Risk  5.0    4.4                                2X Avg.Risk  9.6    7.1                                3X Avg.Risk 23.4   11.0   Hepatic function panel     Status: None   Collection Time: 07/24/15  8:40 AM  Result Value Ref Range   Total Protein 7.1 6.0 - 8.5 g/dL   Albumin 4.6 3.5 - 5.5 g/dL   Bilirubin Total 0.4 0.0 - 1.2 mg/dL   Bilirubin, Direct 0.11 0.00 - 0.40 mg/dL    Comment:               **Please note reference interval change**   Alkaline Phosphatase 112 39 - 117 IU/L   AST 16 0 - 40 IU/L   ALT 34 0 - 44 IU/L  Basic metabolic panel     Status: Abnormal   Collection Time: 07/24/15  8:40 AM  Result Value Ref Range   Glucose 87 65 - 99 mg/dL   BUN 11 6 - 24 mg/dL   Creatinine, Ser 0.72 (L) 0.76 - 1.27 mg/dL   GFR calc non Af Amer 103 >59 mL/min/1.73   GFR calc Af Amer 119 >59 mL/min/1.73   BUN/Creatinine Ratio 15 9 - 20   Sodium 142 134 - 144 mmol/L   Potassium 4.4 3.5 - 5.2 mmol/L   Chloride 100 96 - 106 mmol/L   CO2 24 18 - 29 mmol/L   Calcium 9.8 8.7 - 10.2 mg/dL  Hemoglobin A1c     Status: None (Preliminary result)   Collection Time: 07/24/15  8:40 AM  Result Value Ref Range   Hgb A1c MFr Bld WILL FOLLOW    Est. average glucose Bld gHb Est-mCnc WILL FOLLOW   PSA     Status: None   Collection Time: 07/24/15  8:40 AM  Result Value Ref Range   Prostate Specific Ag, Serum 0.9 0.0 - 4.0 ng/mL    Comment: Roche ECLIA methodology. According to the American Urological Association, Serum PSA should decrease and remain at undetectable levels after radical prostatectomy. The AUA defines biochemical recurrence as an initial PSA value 0.2 ng/mL or greater followed by a subsequent confirmatory PSA value 0.2 ng/mL or greater. Values obtained with different assay methods or kits cannot be  used interchangeably. Results cannot be interpreted as absolute evidence of the presence  or absence of malignant disease.    Marland Kitchen  Physical Findings: AIMS:  , ,  ,  ,    CIWA:    COWS:     Treatment Plan Summary: Medication management  Plan: I took his vitals.  I reviewed CC, tobacco/med/surg Hx, meds effects/ side effects, problem list, therapies and responses as well as current situation/symptoms discussed options. He will continue Prozac  60 mg daily, Zyprexa for mood stabilization and Neurontin and Xanax for anxiety He'll return to see me in 3 months but call if symptoms worsen before that See orders and pt instructions for more details.  MEDICATIONS this encounter: Meds ordered this encounter  Medications  . gabapentin (NEURONTIN) 300 MG capsule    Sig: Take 2 capsules (600 mg total) by mouth 3 (three) times daily.    Dispense:  180 capsule    Refill:  2  . FLUoxetine (PROZAC) 20 MG capsule    Sig: Take 1 capsule (20 mg total) by mouth daily.    Dispense:  30 capsule    Refill:  2  . FLUoxetine (PROZAC) 40 MG capsule    Sig: Take 1 capsule (40 mg total) by mouth daily.    Dispense:  30 capsule    Refill:  2  . OLANZapine (ZYPREXA) 10 MG tablet    Sig: Take 1 tablet (10 mg total) by mouth at bedtime.    Dispense:  30 tablet    Refill:  2  . ALPRAZolam (XANAX) 1 MG tablet    Sig: Take 1 tablet (1 mg total) by mouth 4 (four) times daily.    Dispense:  120 tablet    Refill:  2    Medical Decision Making Problem Points:  Established problem, stable/improving (1), Review of last therapy session (1) and Review of psycho-social stressors (1) Data Points:  Review or order clinical lab tests (1) Review of medication regiment & side effects (2)  I certify that outpatient services furnished can reasonably be expected to improve the patient's condition.   ROSS, [facility] 07/25/2015, 10:56 AM

## 2015-07-27 ENCOUNTER — Encounter: Payer: Self-pay | Admitting: Family Medicine

## 2015-09-09 ENCOUNTER — Encounter: Payer: Self-pay | Admitting: Internal Medicine

## 2015-09-09 ENCOUNTER — Ambulatory Visit (INDEPENDENT_AMBULATORY_CARE_PROVIDER_SITE_OTHER): Payer: Medicare Other | Admitting: Internal Medicine

## 2015-09-09 ENCOUNTER — Other Ambulatory Visit: Payer: Self-pay

## 2015-09-09 VITALS — BP 111/76 | HR 66 | Temp 97.7°F | Ht 70.0 in | Wt 172.0 lb

## 2015-09-09 DIAGNOSIS — R1310 Dysphagia, unspecified: Secondary | ICD-10-CM

## 2015-09-09 DIAGNOSIS — Z86010 Personal history of colonic polyps: Secondary | ICD-10-CM

## 2015-09-09 DIAGNOSIS — K219 Gastro-esophageal reflux disease without esophagitis: Secondary | ICD-10-CM | POA: Diagnosis not present

## 2015-09-09 DIAGNOSIS — K921 Melena: Secondary | ICD-10-CM | POA: Diagnosis not present

## 2015-09-09 MED ORDER — PEG 3350-KCL-NA BICARB-NACL 420 G PO SOLR: 4000.0000 mL | ORAL | Status: DC

## 2015-09-09 NOTE — Patient Instructions (Signed)
Schedule EGD with ED and diagnostic TCS (history of polyps and hematochezia)  Will need propofol  Smoking cessation recommended  Further recommendations to follow

## 2015-09-09 NOTE — Progress Notes (Signed)
Primary Care Physician:  Sallee Lange, MD Primary Gastroenterologist:  Dr. [name]  Pre-Procedure History & Physical: HPI:  [name] is a 60 y.o. male here for office follow-up. He feels hemorrhoid banding helped considerably. He has noted intermittent rectal bleeding. History of advanced colonic adenomas on prior colonoscopy  -  last colonoscopy over 2 years ago. Marland Kitchen He was originally due for a follow-up colonoscopy 3 years from now. Also, reports recurrent esophageal dysphagia.  No structural lesion found a prior EGD He responded nicely to empiric dilation previously.   Reflux symptoms well controlled on Protonix 40 mg twice daily. He continues to smoke. Denies alcohol or illicit drugs  Past Medical History  Diagnosis Date  . ASCVD (arteriosclerotic cardiovascular disease)     inferior MI with circumflex PTCA in 1993;cath in 2000-50% OM 1&2,30% LAD ;2008-25% LAD, 80% nondominant right ,60% small OM 2,EF of 55% with severe basilar inferior hypokinesis; stress nuclear study in 04/2008 moderate inferior scar with peri-infarction ischemic  . Syncope   . Hyperlipidemia   . Hypertension   . Tobacco abuse     50 pack years continuing at one halp pack daily  . COPD (chronic obstructive pulmonary disease) ([facility])     mild ;excercise induced hypoxemia by cp stress test ;asthma ,bronchitis,  . Restless leg syndrome   . Anxiety and depression   . GERD (gastroesophageal reflux disease)   . Inflammatory polyps of colon with rectal bleeding ([place])   . Carcinoma in situ of colon 2004    rectal polyp  . H. pylori infection 2004    treated  . Sleep apnea     does not use CPAP  . Gastritis 12/30/10    EGD Dr [name]  . Colitis, ischemic ([place]) 2011  . Tubular adenoma of colon 06/2009    Colonosocpy Dr [name]  . Leukocytosis     [name]  . Depression   . Anxiety   . Lung nodule 07/16/2011  . CAD (coronary artery disease)   . Diverticulosis   . Hemorrhoids   . Asthma   . Schatzki's ring    . Hiatal hernia     Past Surgical History  Procedure Laterality Date  . Colonoscopy w/ polypectomy  2004    rectal polyp with carcinoma in situ removed via colonoscopy  . Hand surgery      surgical intervention for injury of the fingers of the left hand many years ago  . Colonoscopy  06/2009    normal terminal ileum, segmental mild inflammation of sigmoid colon (bx unremarkable), polyp, tubular adenoma  . Esophagogastroduodenoscopy  02/2009    query Barrett's but bx negative  . Esophagogastroduodenoscopy  12/30/2010    [name],gastritis, dilated 39F, sm HH, 1 small ulcer, Duodenal erosions, benign bx  . Flexible sigmoidoscopy  12/30/2010     [name],; internal hemorrhoids, anal papilla  . Heart stent    . Nasal septoplasty w/ turbinoplasty  10/06/2011    Procedure: NASAL SEPTOPLASTY WITH TURBINATE REDUCTION;  Surgeon: Izora Gala, MD;  Location: [facility];  Service: ENT;  Laterality: Bilateral;  . Cardiac catheterization    . Coronary angioplasty with stent placement  01/26/2012    "1; total is now 2"  . Esophagogastroduodenoscopy (egd) with esophageal dilation N/A 09/12/2012    [id number] Schatzki's ring s/p Maloney dilator. Small hiatal hernia. negative path  . Cholecystectomy  2004  . Colonoscopy N/A 07/31/2013    Dr.Rourk- redundant anal canal hemorrhoids, colonic diverticulosis, tubular  adenoma  . Esophagogastroduodenoscopy (egd) with esophageal dilation N/A 07/31/2013    Dr. [name]- normal egd, s/p [facility] dilation empirically. Normal small bowel biopsies   . Hemorrhoid banding      Dr. [name]  . Left heart catheterization with coronary angiogram N/A 01/26/2012    Procedure: LEFT HEART CATHETERIZATION WITH CORONARY ANGIOGRAM;  Surgeon: [name], MD;  Location[facility] CATH LAB;  Service: Cardiovascular;  Laterality: N/A;  . Percutaneous coronary stent intervention (pci-s) N/A 01/26/2012    Procedure: PERCUTANEOUS CORONARY STENT INTERVENTION (PCI-S);  Surgeon: [name], MD;  Location: [facility] CATH LAB;  Service: Cardiovascular;  Laterality: N/A;    Prior to Admission medications   Medication Sig Start Date End Date Taking? Authorizing Provider  albuterol (PROVENTIL) (2.5 MG/3ML) 0.083% nebulizer solution Take 2.5 mg by nebulization every 6 (six) hours as needed. For shortness of breath   Yes Historical Provider, MD  ALPRAZolam (XANAX) 1 MG tablet Take 1 tablet (1 mg total) by mouth 4 (four) times daily. 07/25/15  Yes Cloria Spring, MD  aspirin EC 81 MG tablet Take 81 mg by mouth daily.    Yes Historical Provider, MD  budesonide-formoterol (SYMBICORT) 80-4.5 MCG/ACT inhaler Inhale 2 puffs into the lungs 2 (two) times daily. 04/23/15  Yes [name], MD  dronabinol (MARINOL) 2.5 MG capsule Take 1 capsule (2.5 mg total) by mouth 2 (two) times daily. 12/27/13  Yes Orvil Feil, NP  FLUoxetine (PROZAC) 20 MG capsule Take 1 capsule (20 mg total) by mouth daily. 07/25/15  Yes Cloria Spring, MD  FLUoxetine (PROZAC) 40 MG capsule Take 1 capsule (40 mg total) by mouth daily. 07/25/15 07/24/16 Yes Cloria Spring, MD  gabapentin (NEURONTIN) 300 MG capsule Take 2 capsules (600 mg total) by mouth 3 (three) times daily. 07/25/15  Yes Cloria Spring, MD  HYDROcodone-acetaminophen (NORCO) 7.5-325 MG tablet Take 1 tablet by mouth 3 (three) times daily as needed for moderate pain. 07/24/15  Yes [name], MD  hydrocortisone (ANUSOL-HC) 2.5 % rectal cream Place 1 application rectally as needed. 07/23/13  Yes Orvil Feil, NP  lisinopril (PRINIVIL,ZESTRIL) 5 MG tablet Take 1 tablet (5 mg total) by mouth daily. 07/24/15  Yes [name], MD  metoprolol succinate (TOPROL-XL) 25 MG 24 hr tablet Take 1 tablet (25 mg total) by mouth daily. 07/24/15  Yes [name], MD  nitroGLYCERIN (NITROSTAT) 0.4 MG SL tablet Place 0.4 mg under the tongue every 5 (five) minutes as needed for chest pain.   Yes Historical Provider, MD  OLANZapine (ZYPREXA) 10 MG tablet Take 1 tablet (10 mg total) by  mouth at bedtime. 07/25/15 07/24/16 Yes Cloria Spring, MD  pantoprazole (PROTONIX) 40 MG tablet Take 1 tablet (40 mg total) by mouth 2 (two) times daily. 07/17/15  Yes Carlis Stable, NP  promethazine (PHENERGAN) 12.5 MG tablet Take 12.5 mg by mouth every 6 (six) hours as needed for nausea or vomiting.   Yes Historical Provider, MD  rOPINIRole (REQUIP) 0.5 MG tablet Take 1 tablet (0.5 mg total) by mouth at bedtime. 07/24/15  Yes [name], MD  vitamin B-12 (CYANOCOBALAMIN) 1000 MCG tablet Take 2,000 mcg by mouth daily.   Yes Historical Provider, MD  atorvastatin (LIPITOR) 80 MG tablet Take 1 tablet (80 mg total) by mouth daily. Patient not taking: Reported on 09/09/2015 05/12/15   Arnoldo Lenis, MD    Allergies as of 09/09/2015  . (No Known Allergies)    Family  History  Problem Relation Age of Onset  . Lung disease Father     deceased, black lung  . Heart disease Mother     blood clots  . Depression Mother   . Cancer Paternal Uncle     unknown type  . Colon cancer Neg Hx   . ADD / ADHD Neg Hx   . Alcohol abuse Neg Hx   . Drug abuse Neg Hx   . Anxiety disorder Neg Hx   . Bipolar disorder Neg Hx   . Dementia Neg Hx   . OCD Neg Hx   . Paranoid behavior Neg Hx   . Schizophrenia Neg Hx   . Physical abuse Neg Hx   . Sexual abuse Neg Hx   . Seizures Neg Hx   . Cancer Maternal Aunt     unknown type  . Kidney failure Maternal Uncle   . Hypertension Brother   . Colon cancer Neg Hx     Social History   Social History  . Marital Status: Married    Spouse Name: N/A  . Number of Children: 3  . Years of Education: N/A   Occupational History  . disable     [employer]   Social History Main Topics  . Smoking status: Current Some Day Smoker -- 0.50 packs/day for 30 years    Types: Cigarettes  . Smokeless tobacco: Current User     Comment: now using vaporizer cigarettes  . Alcohol Use: 1.2 oz/week    2 Cans of beer per week     Comment: Drinks a beer occasionally  . Drug Use: No    . Sexual Activity: Not on file   Other Topics Concern  . Not on file   Social History Narrative   3 stepchildren    Review of Systems: See HPI, otherwise negative ROS  Physical Exam: BP 111/76 mmHg  Pulse 66  Temp(Src) 97.7 F (36.5 C)  Ht 5\' 10"  (1.778 m)  Wt 172 lb (78.019 kg)  BMI 24.68 kg/m2 General:    pleasant and cooperative in NAD Skin:  Intact without significant lesions or rashes. Eyes:  Sclera clear, no icterus.   Conjunctiva pink. Neck:  Supple; no masses or thyromegaly. No significant cervical adenopathy. Lungs:  Clear throughout to auscultation.   No wheezes, crackles, or rhonchi. No acute distress. Heart:  Regular rate and rhythm; no murmurs, clicks, rubs,  or gallops. Abdomen: Non-distended, normal bowel sounds.  Soft and nontender without appreciable mass or hepatosplenomegaly.  Pulses:  Normal pulses noted. Extremities:  Without clubbing or edema.  Impression:  Pleasant 60 year old gentleman with a history of GERD and dysphagia now with recurrent esophageal dysphagia. Reflux symptoms stated to be under good control with twice a day PPI therapy.  Intermittent rectal bleeding even after hemorrhoid banding. He feels his hemorrhoids have settled down considerably after bands were placed. History of advanced colon adenomas and adenomas found at each of his prior colonoscopies It's been over 2 years since he last had a colonoscopy. I feel it would be wise to clear his colon at this time rather than waiting another 3 years prior to performing a follow-up examination.  Recommendations:  I have offered patient both an EGD with dilation as appropriate as well as a surveillance/diagnostic colonoscopy in the near future.The risks, benefits, limitations, imponderables and alternatives regarding both EGD and colonoscopy have been reviewed with the patient. Questions have been answered. All parties agreeable.  He takes Xanax as well as opioids;  has  required considerable  conscious sedation medications previously. I feel he would be best served with deep sedation with the help of Dr. [facility].     Notice: This dictation was prepared with Dragon dictation along with smaller phrase technology. Any transcriptional errors that result from this process are unintentional and may not be corrected upon review.

## 2015-09-09 NOTE — Patient Instructions (Signed)
[name]  09/09/2015     @PREFPERIOPPHARMACY @   Your procedure is scheduled on 09/15/2015.  Report to [name] at 8:45 A.M.  Call this number if you have problems the morning of surgery:  [phone]   Remember:  Do not eat food or drink liquids after midnight.  Take these medicines the morning of surgery with A SIP OF WATER Albuterol nebulizer, Zanax, Symbicort, Prozac, Marinol OR Phenergan if needed  Gabapentin, Zyprexa, Protonix, Hydrocodone, Lisinopril, Metoprolol, Requip   Do not wear jewelry, make-up or nail polish.  Do not wear lotions, powders, or perfumes.  You may wear deodorant.  Do not shave 48 hours prior to surgery.  Men may shave face and neck.  Do not bring valuables to the [name].  [name] is not responsible for any belongings or valuables.  Contacts, dentures or bridgework may not be worn into surgery.  Leave your suitcase in the car.  After surgery it may be brought to your room.  For patients admitted to the [name], discharge time will be determined by your treatment team.  Patients discharged the day of surgery will not be allowed to drive home.    Please read over the following fact sheets that you were given. Anesthesia Post-op Instructions     PATIENT INSTRUCTIONS POST-ANESTHESIA  IMMEDIATELY FOLLOWING SURGERY:  Do not drive or operate machinery for the first twenty four hours after surgery.  Do not make any important decisions for twenty four hours after surgery or while taking narcotic pain medications or sedatives.  If you develop intractable nausea and vomiting or a severe headache please notify your doctor immediately.  FOLLOW-UP:  Please make an appointment with your surgeon as instructed. You do not need to follow up with anesthesia unless specifically instructed to do so.  WOUND CARE INSTRUCTIONS (if applicable):  Keep a dry clean dressing on the anesthesia/puncture wound site if there is drainage.  Once the wound has quit  draining you may leave it open to air.  Generally you should leave the bandage intact for twenty four hours unless there is drainage.  If the epidural site drains for more than 36-48 hours please call the anesthesia department.  QUESTIONS?:  Please feel free to call your physician or the [name] operator if you have any questions, and they will be happy to assist you.      Esophagogastroduodenoscopy Esophagogastroduodenoscopy (EGD) is a procedure that is used to examine the lining of the esophagus, stomach, and first part of the small intestine (duodenum). A long, flexible, lighted tube with a camera attached (endoscope) is inserted down the throat to view these organs. This procedure is done to detect problems or abnormalities, such as inflammation, bleeding, ulcers, or growths, in order to treat them. The procedure lasts 5-20 minutes. It is usually an outpatient procedure, but it may need to be performed in a [name] in emergency cases. LET [name] CARE PROVIDER KNOW ABOUT:  Any allergies you have.  All medicines you are taking, including vitamins, herbs, eye drops, creams, and over-the-counter medicines.  Previous problems you or members of your family have had with the use of anesthetics.  Any blood disorders you have.  Previous surgeries you have had.  Medical conditions you have. RISKS AND COMPLICATIONS Generally, this is a safe procedure. However, problems can occur and include:  Infection.  Bleeding.  Tearing (perforation) of the esophagus, stomach, or duodenum.  Difficulty breathing or not being able to breathe.  Excessive sweating.  Spasms of the larynx.  Slowed heartbeat.  Low blood pressure. BEFORE THE PROCEDURE  Do not eat or drink anything after midnight on the night before the procedure or as directed by your health care provider.  Do not take your regular medicines before the procedure if your health care provider asks you not to. Ask your health care  provider about changing or stopping those medicines.  If you wear dentures, be prepared to remove them before the procedure.  Arrange for someone to drive you home after the procedure. PROCEDURE  A numbing medicine (local anesthetic) may be sprayed in your throat for comfort and to stop you from gagging or coughing.  You will have an IV tube inserted in a vein in your hand or arm. You will receive medicines and fluids through this tube.  You will be given a medicine to relax you (sedative).  A pain reliever will be given through the IV tube.  A mouth guard may be placed in your mouth to protect your teeth and to keep you from biting on the endoscope.  You will be asked to lie on your left side.  The endoscope will be inserted down your throat and into your esophagus, stomach, and duodenum.  Air will be put through the endoscope to allow your health care provider to clearly view the lining of your esophagus.  The lining of your esophagus, stomach, and duodenum will be examined. During the exam, your health care provider may:  Remove tissue to be examined under a microscope (biopsy) for inflammation, infection, or other medical problems.  Remove growths.  Remove objects (foreign bodies) that are stuck.  Treat any bleeding with medicines or other devices that stop tissues from bleeding (hot cautery, clipping devices).  Widen (dilate) or stretch narrowed areas of your esophagus and stomach.  The endoscope will be withdrawn. AFTER THE PROCEDURE  You will be taken to a recovery area for observation. Your blood pressure, heart rate, breathing rate, and blood oxygen level will be monitored often until the medicines you were given have worn off.  Do not eat or drink anything until the numbing medicine has worn off and your gag reflex has returned. You may choke.  Your health care provider should be able to discuss his or her findings with you. It will take longer to discuss the test  results if any biopsies were taken.   This information is not intended to replace advice given to you by your health care provider. Make sure you discuss any questions you have with your health care provider.   Document Released: 10/01/2004 Document Revised: 06/21/2014 Document Reviewed: 05/03/2012 Elsevier Interactive Patient Education 2016 [employer] Esophageal Dilatation Esophageal dilatation is a procedure to open a blocked or narrowed part of the esophagus. The esophagus is the long tube in your throat that carries food and liquid from your mouth to your stomach. The procedure is also called esophageal dilation.  You may need this procedure if you have a buildup of scar tissue in your esophagus that makes it difficult, painful, or even impossible to swallow. This can be caused by gastroesophageal reflux disease (GERD). In rare cases, people need this procedure because they have cancer of the esophagus or a problem with the way food moves through the esophagus. Sometimes you may need to have another dilatation to enlarge the opening of the esophagus gradually. LET [facility] CARE PROVIDER KNOW ABOUT:   Any allergies you have.  All medicines you are taking, including  vitamins, herbs, eye drops, creams, and over-the-counter medicines.  Previous problems you or members of your family have had with the use of anesthetics.  Any blood disorders you have.  Previous surgeries you have had.  Medical conditions you have.  Any antibiotic medicines you are required to take before dental procedures. RISKS AND COMPLICATIONS Generally, this is a safe procedure. However, problems can occur and include:  Bleeding from a tear in the lining of the esophagus.  A hole (perforation) in the esophagus. BEFORE THE PROCEDURE  Do not eat or drink anything after midnight on the night before the procedure or as directed by your health care provider.  Ask your health care provider about changing or  stopping your regular medicines. This is especially important if you are taking diabetes medicines or blood thinners.  Plan to have someone take you home after the procedure. PROCEDURE   You will be given a medicine that makes you relaxed and sleepy (sedative).  A medicine may be sprayed or gargled to numb the back of the throat.  Your health care provider can use various instruments to do an esophageal dilatation. During the procedure, the instrument used will be placed in your mouth and passed down into your esophagus. Options include:  Simple dilators. This instrument is carefully placed in the esophagus to stretch it.  Guided wire bougies. In this method, a flexible tube (endoscope) is used to insert a wire into the esophagus. The dilator is passed over this wire to enlarge the esophagus. Then the wire is removed.  Balloon dilators. An endoscope with a small balloon at the end is passed down into the esophagus. Inflating the balloon gently stretches the esophagus and opens it up. AFTER THE PROCEDURE  Your blood pressure, heart rate, breathing rate, and blood oxygen level will be monitored often until the medicines you were given have worn off.  Your throat may feel slightly sore and will probably still feel numb. This will improve slowly over time.  You will not be allowed to eat or drink until the throat numbness has resolved.  If this is a same-day procedure, you may be allowed to go home once you have been able to drink, urinate, and sit on the edge of the bed without nausea or dizziness.  If this is a same-day procedure, you should have a friend or family member with you for the next 24 hours after the procedure.   This information is not intended to replace advice given to you by your health care provider. Make sure you discuss any questions you have with your health care provider.   Document Released: 07/22/2005 Document Revised: 06/21/2014 Document Reviewed:  10/10/2013 Elsevier Interactive Patient Education 2016 [employer]. Colonoscopy A colonoscopy is an exam to look at the entire large intestine (colon). This exam can help find problems such as tumors, polyps, inflammation, and areas of bleeding. The exam takes about 1 hour.  LET [name] CARE PROVIDER KNOW ABOUT:   Any allergies you have.  All medicines you are taking, including vitamins, herbs, eye drops, creams, and over-the-counter medicines.  Previous problems you or members of your family have had with the use of anesthetics.  Any blood disorders you have.  Previous surgeries you have had.  Medical conditions you have. RISKS AND COMPLICATIONS  Generally, this is a safe procedure. However, as with any procedure, complications can occur. Possible complications include:  Bleeding.  Tearing or rupture of the colon wall.  Reaction to medicines given during  the exam.  Infection (rare). BEFORE THE PROCEDURE   Ask your health care provider about changing or stopping your regular medicines.  You may be prescribed an oral bowel prep. This involves drinking a large amount of medicated liquid, starting the day before your procedure. The liquid will cause you to have multiple loose stools until your stool is almost clear or light green. This cleans out your colon in preparation for the procedure.  Do not eat or drink anything else once you have started the bowel prep, unless your health care provider tells you it is safe to do so.  Arrange for someone to drive you home after the procedure. PROCEDURE   You will be given medicine to help you relax (sedative).  You will lie on your side with your knees bent.  A long, flexible tube with a light and camera on the end (colonoscope) will be inserted through the rectum and into the colon. The camera sends video back to a computer screen as it moves through the colon. The colonoscope also releases carbon dioxide gas to inflate the  colon. This helps your health care provider see the area better.  During the exam, your health care provider may take a small tissue sample (biopsy) to be examined under a microscope if any abnormalities are found.  The exam is finished when the entire colon has been viewed. AFTER THE PROCEDURE   Do not drive for 24 hours after the exam.  You may have a small amount of blood in your stool.  You may pass moderate amounts of gas and have mild abdominal cramping or bloating. This is caused by the gas used to inflate your colon during the exam.  Ask when your test results will be ready and how you will get your results. Make sure you get your test results.   This information is not intended to replace advice given to you by your health care provider. Make sure you discuss any questions you have with your health care provider.   Document Released: 05/28/2000 Document Revised: 03/21/2013 Document Reviewed: 02/05/2013 Elsevier Interactive Patient Education [employer].

## 2015-09-11 ENCOUNTER — Encounter (HOSPITAL_COMMUNITY): Payer: Self-pay

## 2015-09-11 ENCOUNTER — Encounter (HOSPITAL_COMMUNITY)
Admission: RE | Admit: 2015-09-11 | Discharge: 2015-09-11 | Disposition: A | Discharge status: Home / Self Care | Payer: Medicare Other | Source: Ambulatory Visit | Attending: Internal Medicine | Admitting: Internal Medicine

## 2015-09-11 DIAGNOSIS — Z01812 Encounter for preprocedural laboratory examination: Secondary | ICD-10-CM | POA: Insufficient documentation

## 2015-09-11 DIAGNOSIS — Z01810 Encounter for preprocedural cardiovascular examination: Secondary | ICD-10-CM | POA: Insufficient documentation

## 2015-09-11 DIAGNOSIS — I10 Essential (primary) hypertension: Secondary | ICD-10-CM | POA: Insufficient documentation

## 2015-09-11 LAB — BASIC METABOLIC PANEL
ANION GAP: 12 (ref 5–15)
BUN: 11 mg/dL (ref 6–20)
CALCIUM: 9 mg/dL (ref 8.9–10.3)
CHLORIDE: 106 mmol/L (ref 101–111)
CO2: 22 mmol/L (ref 22–32)
Creatinine, Ser: 0.77 mg/dL (ref 0.61–1.24)
GFR calc non Af Amer: >60 mL/min (ref >60)
Glucose, Bld: 128 mg/dL — ABNORMAL HIGH (ref 65–99)
Potassium: 3.5 mmol/L (ref 3.5–5.1)
SODIUM: 140 mmol/L (ref 135–145)

## 2015-09-11 LAB — CBC
HEMATOCRIT: 46.1 % (ref 39.0–52.0)
HEMOGLOBIN: 16.6 g/dL (ref 13.0–17.0)
MCH: 32.5 pg (ref 26.0–34.0)
MCHC: 36 g/dL (ref 30.0–36.0)
MCV: 90.4 fL (ref 78.0–100.0)
Platelets: 178 10*3/uL (ref 150–400)
RBC: 5.1 MIL/uL (ref 4.22–5.81)
RDW: 13.5 % (ref 11.5–15.5)
WBC: 10.6 10*3/uL — AB (ref 4.0–10.5)

## 2015-09-12 ENCOUNTER — Ambulatory Visit (INDEPENDENT_AMBULATORY_CARE_PROVIDER_SITE_OTHER): Payer: Medicare Other | Admitting: Cardiology

## 2015-09-12 ENCOUNTER — Encounter: Payer: Self-pay | Admitting: Cardiology

## 2015-09-12 VITALS — BP 118/82 | HR 89 | Ht 70.0 in | Wt 165.0 lb

## 2015-09-12 DIAGNOSIS — I2510 Atherosclerotic heart disease of native coronary artery without angina pectoris: Secondary | ICD-10-CM

## 2015-09-12 DIAGNOSIS — E785 Hyperlipidemia, unspecified: Secondary | ICD-10-CM | POA: Diagnosis not present

## 2015-09-12 DIAGNOSIS — I10 Essential (primary) hypertension: Secondary | ICD-10-CM

## 2015-09-12 DIAGNOSIS — M79606 Pain in leg, unspecified: Secondary | ICD-10-CM | POA: Diagnosis not present

## 2015-09-12 DIAGNOSIS — F17200 Nicotine dependence, unspecified, uncomplicated: Secondary | ICD-10-CM

## 2015-09-12 MED ORDER — NICOTINE 14 MG/24HR TD PT24: MEDICATED_PATCH | TRANSDERMAL | Status: DC

## 2015-09-12 MED ORDER — NICOTINE 7 MG/24HR TD PT24: MEDICATED_PATCH | TRANSDERMAL | Status: DC

## 2015-09-12 MED ORDER — NICOTINE 21 MG/24HR TD PT24: MEDICATED_PATCH | TRANSDERMAL | Status: DC

## 2015-09-12 NOTE — Patient Instructions (Addendum)
Your physician wants you to follow-up in: 6 months with Dr. Bryna Colander will receive a reminder letter in the mail two months in advance. If you don't receive a letter, please call our office to schedule the follow-up appointment.  Your physician has recommended you make the following change in your medication:   START NICODERM PATCH  21 MG PATCH DAILY FOR 6 WEEKS  14 MG PATCH FOR 2 WEEKS  7 MG PATCH FOR 2 WEEKS  Your physician has requested that you have an ankle brachial index (ABI). During this test an ultrasound and blood pressure cuff are used to evaluate the arteries that supply the arms and legs with blood. Allow thirty minutes for this exam. There are no restrictions or special instructions.    Thank you for choosing [facility]!!

## 2015-09-12 NOTE — Progress Notes (Signed)
Patient ID: [name], male   DOB: [birth year], 60 y.o.   MRN: [id number]     Clinical Summary [name] is a 60 y.o.male seen today for follow up of the following medical problems.   1. 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 LVEF 50-55% by LV gram.  08/2011 echo LVEF [id number], grade I diastolic dysfunction.   - denies any chest pain. Stable DOE at 1 block. No LE edema - compliant with meds   2. Hyperlipidemia - compliant with statin - 2/217 TC 171 TG 355 LDL 71  3. HTN - compliant with meds - does not check regularly at home   4. COPD - followed by pcp - compliant with inhaler  5. OSA - does not use CPAP machine due to discomfort  6. Tobacco - 1/2 ppd, working to quit. Chantix did not help.   7. GERD/Dysphagia - followed by GI  8. Leg pains - cramping pain bilateral calves and thighs at approx 1 block. Better with hydrodone. No sores on feet.  Past Medical History  Diagnosis Date  . ASCVD (arteriosclerotic cardiovascular disease)     inferior MI with circumflex PTCA in 1993;cath in 2000-50% OM 1&2,30% LAD ;2008-25% LAD, 80% nondominant right ,60% small OM 2,EF of 55% with severe basilar inferior hypokinesis; stress nuclear study in 04/2008 moderate inferior scar with peri-infarction ischemic  . Syncope   . Hyperlipidemia   . Hypertension   . Tobacco abuse     50 pack years continuing at one halp pack daily  . COPD (chronic obstructive pulmonary disease) (HCC)     mild ;excercise induced hypoxemia by cp stress test ;asthma ,bronchitis,  . Restless leg syndrome   . Anxiety and depression   . GERD (gastroesophageal reflux disease)   . Inflammatory polyps of colon with rectal bleeding ([name])   . Carcinoma in situ of colon 2004    rectal polyp  . H. pylori infection 2004    treated  . Sleep apnea     does not use  CPAP  . Gastritis 12/30/10    EGD Dr [name]  . Colitis, ischemic ([place]) 2011  . Tubular adenoma of colon 06/2009    Colonosocpy Dr [name]  . Leukocytosis     Dr [name]  . Depression   . Anxiety   . Lung nodule 07/16/2011  . CAD (coronary artery disease)   . Diverticulosis   . Hemorrhoids   . Asthma   . Schatzki's ring   . Hiatal hernia      No Known Allergies   Current Outpatient Prescriptions  Medication Sig Dispense Refill  . albuterol (PROVENTIL) (2.5 MG/3ML) 0.083% nebulizer solution Take 2.5 mg by nebulization every 6 (six) hours as needed. For shortness of breath    . ALPRAZolam (XANAX) 1 MG tablet Take 1 tablet (1 mg total) by mouth 4 (four) times daily. 120 tablet 2  . aspirin EC 81 MG tablet Take 81 mg by mouth daily.     Marland Kitchen atorvastatin (LIPITOR) 80 MG tablet Take 1 tablet (80 mg total) by mouth daily. 90 tablet 3  . budesonide-formoterol (SYMBICORT) 80-4.5 MCG/ACT inhaler Inhale 2 puffs into the lungs 2 (two) times daily. 1 Inhaler 12  . dronabinol (MARINOL) 2.5 MG capsule Take 1 capsule (2.5 mg total) by mouth 2 (  two) times daily. 60 capsule 1  . FLUoxetine (PROZAC) 20 MG capsule Take 1 capsule (20 mg total) by mouth daily. 30 capsule 2  . FLUoxetine (PROZAC) 40 MG capsule Take 1 capsule (40 mg total) by mouth daily. 30 capsule 2  . gabapentin (NEURONTIN) 300 MG capsule Take 2 capsules (600 mg total) by mouth 3 (three) times daily. 180 capsule 2  . HYDROcodone-acetaminophen (NORCO) 7.5-325 MG tablet Take 1 tablet by mouth 3 (three) times daily as needed for moderate pain. 90 tablet 0  . hydrocortisone (ANUSOL-HC) 2.5 % rectal cream Place 1 application rectally as needed.    Marland Kitchen lisinopril (PRINIVIL,ZESTRIL) 5 MG tablet Take 1 tablet (5 mg total) by mouth daily. 90 tablet 3  . metoprolol succinate (TOPROL-XL) 25 MG 24 hr tablet Take 1 tablet (25 mg total) by mouth daily. 90 tablet 3  . nitroGLYCERIN (NITROSTAT) 0.4 MG SL tablet Place 0.4 mg under the tongue every 5  (five) minutes as needed for chest pain.    Marland Kitchen OLANZapine (ZYPREXA) 10 MG tablet Take 1 tablet (10 mg total) by mouth at bedtime. 30 tablet 2  . pantoprazole (PROTONIX) 40 MG tablet Take 1 tablet (40 mg total) by mouth 2 (two) times daily. 180 tablet 3  . polyethylene glycol-electrolytes (TRILYTE) 420 g solution Take 4,000 mLs by mouth as directed. 4000 mL 0  . promethazine (PHENERGAN) 12.5 MG tablet Take 12.5 mg by mouth every 6 (six) hours as needed for nausea or vomiting.    Marland Kitchen rOPINIRole (REQUIP) 0.5 MG tablet Take 1 tablet (0.5 mg total) by mouth at bedtime. 90 tablet 3  . vitamin B-12 (CYANOCOBALAMIN) 1000 MCG tablet Take 2,000 mcg by mouth daily.    . [DISCONTINUED] amLODipine (NORVASC) 5 MG tablet Take 5 mg by mouth daily.      No current facility-administered medications for this visit.     Past Surgical History  Procedure Laterality Date  . Colonoscopy w/ polypectomy  2004    rectal polyp with carcinoma in situ removed via colonoscopy  . Hand surgery      surgical intervention for injury of the fingers of the left hand many years ago  . Colonoscopy  06/2009    normal terminal ileum, segmental mild inflammation of sigmoid colon (bx unremarkable), polyp, tubular adenoma  . Esophagogastroduodenoscopy  02/2009    query Barrett's but bx negative  . Esophagogastroduodenoscopy  12/30/2010    [name],gastritis, dilated 98F, sm HH, 1 small ulcer, Duodenal erosions, benign bx  . Flexible sigmoidoscopy  12/30/2010     [name],; internal hemorrhoids, anal papilla  . Heart stent    . Nasal septoplasty w/ turbinoplasty  10/06/2011    Procedure: NASAL SEPTOPLASTY WITH TURBINATE REDUCTION;  Surgeon: [name] Gala, MD;  Location: [place];  Service: ENT;  Laterality: Bilateral;  . Cardiac catheterization    . Coronary angioplasty with stent placement  01/26/2012    "1; total is now 2"  . Esophagogastroduodenoscopy (egd) with esophageal dilation N/A 09/12/2012    [id number] Schatzki's  ring s/p Maloney dilator. Small hiatal hernia. negative path  . Cholecystectomy  2004  . Colonoscopy N/A 07/31/2013    Dr.Rourk- redundant anal canal hemorrhoids, colonic diverticulosis, tubular adenoma  . Esophagogastroduodenoscopy (egd) with esophageal dilation N/A 07/31/2013    Dr. Gala [name]- normal egd, s/p [employer] dilation empirically. Normal small bowel biopsies   . Hemorrhoid banding      Dr. Gala [name]  . Left heart catheterization with coronary angiogram N/A 01/26/2012  Procedure: LEFT HEART CATHETERIZATION WITH CORONARY ANGIOGRAM;  Surgeon: [name], MD;  Location: [facility] CATH LAB;  Service: Cardiovascular;  Laterality: N/A;  . Percutaneous coronary stent intervention (pci-s) N/A 01/26/2012    Procedure: PERCUTANEOUS CORONARY STENT INTERVENTION (PCI-S);  Surgeon: [name], MD;  Location: [facility] CATH LAB;  Service: Cardiovascular;  Laterality: N/A;     No Known Allergies    Family History  Problem Relation Age of Onset  . Lung disease Father     deceased, black lung  . Heart disease Mother     blood clots  . Depression Mother   . Cancer Paternal Uncle     unknown type  . Colon cancer Neg Hx   . ADD / ADHD Neg Hx   . Alcohol abuse Neg Hx   . Drug abuse Neg Hx   . Anxiety disorder Neg Hx   . Bipolar disorder Neg Hx   . Dementia Neg Hx   . OCD Neg Hx   . Paranoid behavior Neg Hx   . Schizophrenia Neg Hx   . Physical abuse Neg Hx   . Sexual abuse Neg Hx   . Seizures Neg Hx   . Cancer Maternal Aunt     unknown type  . Kidney failure Maternal Uncle   . Hypertension Brother   . Colon cancer Neg Hx      Social History Mr. Overmiller reports that he has been smoking Cigarettes.  He has a 15 pack-year smoking history. He uses smokeless tobacco. Mr. Lasswell reports that he drinks about 1.2 oz of alcohol per week.   Review of Systems CONSTITUTIONAL: No weight loss, fever, chills, weakness or fatigue.  HEENT: Eyes: No visual loss, blurred vision, double vision or yellow  sclerae.No hearing loss, sneezing, congestion, runny nose or sore throat.  SKIN: No rash or itching.  CARDIOVASCULAR: per HPI RESPIRATORY: No shortness of breath, cough or sputum.  GASTROINTESTINAL: No anorexia, nausea, vomiting or diarrhea. No abdominal pain or blood.  GENITOURINARY: No burning on urination, no polyuria NEUROLOGICAL: No headache, dizziness, syncope, paralysis, ataxia, numbness or tingling in the extremities. No change in bowel or bladder control.  MUSCULOSKELETAL: leg pains with walking LYMPHATICS: No enlarged nodes. No history of splenectomy.  PSYCHIATRIC: No history of depression or anxiety.  ENDOCRINOLOGIC: No reports of sweating, cold or heat intolerance. No polyuria or polydipsia.  Marland Kitchen   Physical Examination Filed Vitals:   09/12/15 0900  BP: 118/82  Pulse: 89   Filed Vitals:   09/12/15 0900  Height: 5\' 10"  (1.778 m)  Weight: 165 lb (74.844 kg)    Gen: resting comfortably, no acute distress HEENT: no scleral icterus, pupils equal round and reactive, no palptable cervical adenopathy,  CV: RRR, no m/r/g, no jvd Resp: Clear to auscultation bilaterally GI: abdomen is soft, non-tender, non-distended, normal bowel sounds, no hepatosplenomegaly MSK: extremities are warm, no edema.  Skin: warm, no rash Neuro:  no focal deficits Psych: appropriate affect   Diagnostic Studies  02/2010 Cath DESCRIPTION OF PROCEDURE: The risks and indication were explained. Consent was signed and placed on the chart. A 4-French arterial sheath was placed in the right femoral artery using a modified Seldinger technique. A standard catheters including a JL-4, 3DRC, and angled pigtail were used. All catheter exchanges were made over wire. There were no apparent complications. Central aortic pressure 109/67 with a mean of 86. LV pressure 110/70 with an EDP of 28. There was no aortic stenosis.  Left main was normal.  LAD was  a long vessel coursing to the apex, it gave off  a moderate-sized diagonal Harlym Gehling. There was a 40% lesion in the proximal LAD, otherwise minimal luminal irregularities.  Left circumflex was a large dominant vessel, gave off a narrow caliber ramus, large OM-1, and narrow caliber OM-2 in several posterolaterals. In the proximal left circumflex, he had a 40% lesion which extended into the proximal portion of the OM-1. There was a 30% lesion in the mid OM- 1. In the OM-2, there was a long 80% stenosis throughout the ostial and proximal portion. This vessel was very small caliber vessel.  Right coronary artery was a nondominant vessel, gave off an RV Salman Wellen. There was approximately 90% stenosis in the proximal portion. Once again, this was a very small nondominant vessel.  Left ventriculogram done in the RAO position showed an EF of 50-55% with inferior basilar akinesis. There was no significant mitral regurgitation.  Abdominal aortogram showed 40% ostial lesion in the right renal artery. The left renal artery was widely patent. There was no abdominal aortic aneurysm. On panning down over the iliac and femoral system, there was no high-grade lesions.  ASSESSMENT: 1. Stable coronary artery disease with high-grade lesions in the small  obtuse marginal 2 and right coronary artery both of which are too  small for percutaneous intervention. 2. Left ventricular ejection fraction was 50-55% with mildly elevated  filling pressures. 3. Very mild right renal artery stenosis. 4. No obvious high-grade proximal peripheral arterial disease.  PLAN/DISCUSSION: I suspect most of his dyspnea is likely due to his lung disease. I have urged him to stop smoking. We will continue medical therapy now for his heart disease.   08/2011 Echo Study Conclusions  - Left ventricle: The cavity size was normal. There was mild focal basal hypertrophy of the septum. The estimated ejection fraction was 55%. There is akinesis of  the basalinferior myocardium. Doppler parameters are consistent with abnormal left ventricular relaxation (grade 1 diastolic dysfunction). - Mitral valve: Trivial regurgitation. - Left atrium: The atrium was at the upper limits of normal in size. - Tricuspid valve: Trivial regurgitation. - Pulmonary arteries: PA peak pressure: 50mm Hg (S). - Pericardium, extracardiac: There was no pericardial effusion.   Assessment and Plan   1. CAD - no current symptoms - continue current meds  2. Hyperlipidemia - at goal, continue statin  3. HTN - at goal, continue current meds  4. COPD - per pcp  5. OSA - encouraged to use CPAP  6. Tobacco - working to quit, will give Rx for nicotine patches  7. Leg pains - symptoms suggestive of claudication, will order ABIs   Arnoldo Lenis, M.D.

## 2015-09-15 ENCOUNTER — Encounter (HOSPITAL_COMMUNITY): Payer: Self-pay | Admitting: *Deleted

## 2015-09-15 ENCOUNTER — Ambulatory Visit (HOSPITAL_COMMUNITY): Payer: Medicare Other | Admitting: Anesthesiology

## 2015-09-15 ENCOUNTER — Ambulatory Visit (HOSPITAL_COMMUNITY)
Admission: RE | Admit: 2015-09-15 | Discharge: 2015-09-15 | Disposition: A | Discharge status: Home / Self Care | Payer: Medicare Other | Source: Ambulatory Visit | Attending: Internal Medicine | Admitting: Internal Medicine

## 2015-09-15 ENCOUNTER — Encounter (HOSPITAL_COMMUNITY)
Admission: RE | Disposition: A | Discharge status: Home / Self Care | Payer: Self-pay | Source: Ambulatory Visit | Attending: Internal Medicine

## 2015-09-15 DIAGNOSIS — K921 Melena: Secondary | ICD-10-CM | POA: Diagnosis not present

## 2015-09-15 DIAGNOSIS — Z7951 Long term (current) use of inhaled steroids: Secondary | ICD-10-CM | POA: Insufficient documentation

## 2015-09-15 DIAGNOSIS — K219 Gastro-esophageal reflux disease without esophagitis: Secondary | ICD-10-CM | POA: Insufficient documentation

## 2015-09-15 DIAGNOSIS — D124 Benign neoplasm of descending colon: Secondary | ICD-10-CM | POA: Diagnosis not present

## 2015-09-15 DIAGNOSIS — Z7982 Long term (current) use of aspirin: Secondary | ICD-10-CM | POA: Insufficient documentation

## 2015-09-15 DIAGNOSIS — F17210 Nicotine dependence, cigarettes, uncomplicated: Secondary | ICD-10-CM | POA: Insufficient documentation

## 2015-09-15 DIAGNOSIS — I10 Essential (primary) hypertension: Secondary | ICD-10-CM | POA: Diagnosis not present

## 2015-09-15 DIAGNOSIS — F329 Major depressive disorder, single episode, unspecified: Secondary | ICD-10-CM | POA: Diagnosis not present

## 2015-09-15 DIAGNOSIS — K642 Third degree hemorrhoids: Secondary | ICD-10-CM | POA: Diagnosis not present

## 2015-09-15 DIAGNOSIS — Z860100 Personal history of colon polyps, unspecified: Secondary | ICD-10-CM | POA: Insufficient documentation

## 2015-09-15 DIAGNOSIS — J449 Chronic obstructive pulmonary disease, unspecified: Secondary | ICD-10-CM | POA: Insufficient documentation

## 2015-09-15 DIAGNOSIS — G4730 Sleep apnea, unspecified: Secondary | ICD-10-CM | POA: Diagnosis not present

## 2015-09-15 DIAGNOSIS — I2510 Atherosclerotic heart disease of native coronary artery without angina pectoris: Secondary | ICD-10-CM | POA: Diagnosis not present

## 2015-09-15 DIAGNOSIS — R1310 Dysphagia, unspecified: Secondary | ICD-10-CM | POA: Insufficient documentation

## 2015-09-15 DIAGNOSIS — K449 Diaphragmatic hernia without obstruction or gangrene: Secondary | ICD-10-CM | POA: Insufficient documentation

## 2015-09-15 DIAGNOSIS — K5730 Diverticulosis of large intestine without perforation or abscess without bleeding: Secondary | ICD-10-CM | POA: Diagnosis not present

## 2015-09-15 DIAGNOSIS — F419 Anxiety disorder, unspecified: Secondary | ICD-10-CM | POA: Diagnosis not present

## 2015-09-15 DIAGNOSIS — Z79899 Other long term (current) drug therapy: Secondary | ICD-10-CM | POA: Insufficient documentation

## 2015-09-15 DIAGNOSIS — J45909 Unspecified asthma, uncomplicated: Secondary | ICD-10-CM | POA: Diagnosis not present

## 2015-09-15 DIAGNOSIS — E785 Hyperlipidemia, unspecified: Secondary | ICD-10-CM | POA: Insufficient documentation

## 2015-09-15 DIAGNOSIS — D120 Benign neoplasm of cecum: Secondary | ICD-10-CM | POA: Diagnosis not present

## 2015-09-15 DIAGNOSIS — K649 Unspecified hemorrhoids: Secondary | ICD-10-CM | POA: Insufficient documentation

## 2015-09-15 HISTORY — PX: COLONOSCOPY WITH PROPOFOL: SHX5780

## 2015-09-15 HISTORY — PX: ESOPHAGOGASTRODUODENOSCOPY (EGD) WITH PROPOFOL: SHX5813

## 2015-09-15 HISTORY — PX: POLYPECTOMY: SHX5525

## 2015-09-15 HISTORY — PX: MALONEY DILATION: SHX5535

## 2015-09-15 SURGERY — COLONOSCOPY WITH PROPOFOL
Anesthesia: Monitor Anesthesia Care

## 2015-09-15 MED ORDER — LACTATED RINGERS IV SOLN
INTRAVENOUS | Status: DC
  Administered 2015-09-15: 10:00:00 via INTRAVENOUS

## 2015-09-15 MED ORDER — LIDOCAINE VISCOUS 2 % MT SOLN
OROMUCOSAL | Status: AC
  Filled 2015-09-15: qty 15

## 2015-09-15 MED ORDER — MIDAZOLAM HCL 2 MG/2ML IJ SOLN
INTRAMUSCULAR | Status: AC
  Filled 2015-09-15: qty 2

## 2015-09-15 MED ORDER — FENTANYL CITRATE (PF) 100 MCG/2ML IJ SOLN
INTRAMUSCULAR | Status: AC
  Filled 2015-09-15: qty 2

## 2015-09-15 MED ORDER — PROPOFOL 10 MG/ML IV BOLUS
INTRAVENOUS | Status: AC
  Filled 2015-09-15: qty 20

## 2015-09-15 MED ORDER — LIDOCAINE VISCOUS 2 % MT SOLN
15.0000 mL | Freq: Once | OROMUCOSAL | Status: AC
  Administered 2015-09-15: 6 mL via OROMUCOSAL

## 2015-09-15 MED ORDER — MIDAZOLAM HCL 5 MG/5ML IJ SOLN
INTRAMUSCULAR | Status: DC | PRN
  Administered 2015-09-15: 2 mg via INTRAVENOUS

## 2015-09-15 MED ORDER — ONDANSETRON HCL 4 MG/2ML IJ SOLN: 4.0000 mg | Freq: Once | INTRAMUSCULAR | Status: DC | PRN

## 2015-09-15 MED ORDER — FENTANYL CITRATE (PF) 100 MCG/2ML IJ SOLN: 25.0000 ug | INTRAMUSCULAR | Status: DC | PRN

## 2015-09-15 MED ORDER — FENTANYL CITRATE (PF) 100 MCG/2ML IJ SOLN
25.0000 ug | INTRAMUSCULAR | Status: AC
  Administered 2015-09-15 (×2): 25 ug via INTRAVENOUS

## 2015-09-15 MED ORDER — MIDAZOLAM HCL 2 MG/2ML IJ SOLN
1.0000 mg | INTRAMUSCULAR | Status: DC | PRN
  Administered 2015-09-15: 2 mg via INTRAVENOUS
  Filled 2015-09-15: qty 2

## 2015-09-15 MED ORDER — PROPOFOL 500 MG/50ML IV EMUL
INTRAVENOUS | Status: DC | PRN
  Administered 2015-09-15: 11:00:00 via INTRAVENOUS
  Administered 2015-09-15: 100 ug/kg/min via INTRAVENOUS

## 2015-09-15 NOTE — Anesthesia Postprocedure Evaluation (Signed)
Anesthesia Post Note  Patient: [name]  Procedure(s) Performed: Procedure(s) (LRB): COLONOSCOPY WITH PROPOFOL (N/A) ESOPHAGOGASTRODUODENOSCOPY (EGD) WITH PROPOFOL (N/A) MALONEY DILATION (N/A) POLYPECTOMY  Patient location during evaluation: PACU Anesthesia Type: MAC Level of consciousness: awake and patient cooperative Pain management: pain level controlled Vital Signs Assessment: post-procedure vital signs reviewed and stable Respiratory status: nonlabored ventilation and respiratory function stable Cardiovascular status: blood pressure returned to baseline Postop Assessment: no signs of nausea or vomiting Anesthetic complications: no    Last Vitals:  Filed Vitals:   09/15/15 1055 09/15/15 1100  BP: 109/80 114/84  Pulse:  79  Temp:    Resp: 21 22    Last Pain: There were no vitals filed for this visit.               Amos Gaber J

## 2015-09-15 NOTE — Op Note (Addendum)
[facility] Patient Name: [name] Procedure Date: 09/15/2015 10:33 AM MRN: [id number] Date of Birth: [birth year] Attending MD: Norvel Richards , MD CSN: [id number] Age: 60 Admit Type: Outpatient Procedure:                Colonoscopy Indications:              Hematochezia Providers:                Norvel Richards, MD, Renda Rolls, RN, Georgeann Oppenheim, Technician Referring MD:             Elayne Snare. [name], MD (Referring MD) Medicines:                Monitored Anesthesia Care Complications:            No immediate complications. Estimated Blood Loss:     Estimated blood loss was minimal. Procedure:                Pre-Anesthesia Assessment:                           - Prior to the procedure, a History and Physical                            was performed, and patient medications and                            allergies were reviewed. The patient's tolerance of                            previous anesthesia was also reviewed. The risks                            and benefits of the procedure and the sedation                            options and risks were discussed with the patient.                            All questions were answered, and informed consent                            was obtained. Prior Anticoagulants: The patient has                            taken no previous anticoagulant or antiplatelet                            agents. ASA Grade Assessment: III - A patient with                            severe systemic disease. After reviewing the risks  and benefits, the patient was deemed in                            satisfactory condition to undergo the procedure.                           - Prior to the procedure, a History and Physical                            was performed, and patient medications and                            allergies were reviewed. The patient's tolerance of     previous anesthesia was also reviewed. The risks                            and benefits of the procedure and the sedation                            options and risks were discussed with the patient.                            All questions were answered, and informed consent                            was obtained. Prior Anticoagulants: The patient has                            taken no previous anticoagulant or antiplatelet                            agents. ASA Grade Assessment: III - A patient with                            severe systemic disease. After reviewing the risks                            and benefits, the patient was deemed in                            satisfactory condition to undergo the procedure.                           After obtaining informed consent, the colonoscope                            was passed under direct vision. Throughout the                            procedure, the patient's blood pressure, pulse, and                            oxygen saturations were monitored continuously. The  [id number]) scope was introduced through                            the anus and advanced to the the cecum, identified                            by appendiceal orifice and ileocecal valve. The                            quality of the bowel preparation was adequate. The                            ileocecal valve, appendiceal orifice, and rectum                            were photographed. The colonoscopy was performed                            without difficulty. The patient tolerated the                            procedure well. The quality of the bowel                            preparation was adequate. Scope In: 10:35:07 AM Scope Out: 10:47:11 AM Scope Withdrawal Time: 0 hours 8 minutes 13 seconds  Total Procedure Duration: 0 hours 12 minutes 4 seconds  Findings:      The perianal exam was abnormal.      Scattered  medium-mouthed diverticula were found in the entire colon.      Two sessile polyps were found in the descending colon and cecum. The       polyps were 5 mm in size.      The digital rectal exam findings include internal hemorrhoids that       prolapse with straining, but require manual replacement into the anal       canal (Grade III). Impression:               - Abnormal perianal exam. /grade 3 hemorrhoids                           - Diverticulosis in the entire examined colon.                           - Two 5 mm polyps, removed with a cold snare.                            Resected and retrieved.                           - Two 5 mm polyps in the descending colon and in                            the cecum. Moderate Sedation:      Per Anesthesia Care Recommendation:           -  Patient has a contact number available for                            emergencies. The signs and symptoms of potential                            delayed complications were discussed with the                            patient. Return to normal activities tomorrow.                            Written discharge instructions were provided to the                            patient.                           - Advance diet as tolerated.                           - Continue present medications.                           - Repeat colonoscopy for surveillance based on                            pathology results.                           - Return to GI office in 2 months. Patient may need                            additional hemorrhoid treatment. See EGD report. Procedure Code(s):        --- Professional ---                           [phone], Colonoscopy, flexible; diagnostic, including                            collection of specimen(s) by brushing or washing,                            when performed (separate procedure) Diagnosis Code(s):        --- Professional ---                           D12.6, Benign  neoplasm of colon, unspecified                           D12.4, Benign neoplasm of descending colon                           D12.0, Benign neoplasm of cecum  K92.1, Melena (includes Hematochezia)                           K57.30, Diverticulosis of large intestine without                            perforation or abscess without bleeding CPT copyright 2016 [employer]. All rights reserved. The codes documented in this report are preliminary and upon coder review may  be revised to meet current compliance requirements. [name]. Rourk, MD Norvel Richards, MD 09/15/2015 11:08:36 AM This report has been signed electronically. Number of Addenda: 1 Addendum Number: 1   Addendum Date: 09/16/2015 10:55:33 AM      only 2 polyps were found and removed [name]. Rourk, MD Norvel Richards, MD 09/16/2015 10:55:53 AM This report has been signed electronically.

## 2015-09-15 NOTE — Interval H&P Note (Signed)
History and Physical Interval Note:  09/15/2015 10:02 AM  [name]  has presented today for surgery, with the diagnosis of HISTORY OF POLYPS/HEMATOCHEZIA  The various methods of treatment have been discussed with the patient and family. After consideration of risks, benefits and other options for treatment, the patient has consented to  Procedure(s) with comments: COLONOSCOPY WITH PROPOFOL (N/A) - 1015 ESOPHAGOGASTRODUODENOSCOPY (EGD) WITH PROPOFOL (N/A) MALONEY DILATION (N/A) as a surgical intervention .  The patient's history has been reviewed, patient examined, no change in status, stable for surgery.  I have reviewed the patient's chart and labs.  Questions were answered to the patient's satisfaction.     [name]  No change. EGD/EGD and colonoscopy today per plan.   The risks, benefits, limitations, imponderables and alternatives regarding both EGD and colonoscopy have been reviewed with the patient. Questions have been answered. All parties agreeable.

## 2015-09-15 NOTE — Op Note (Signed)
[facility] Patient Name: [name] Procedure Date: 09/15/2015 10:16 AM MRN: [id number] Date of Birth: 1956/05/31 Attending MD: Norvel Richards , MD CSN: [id number] Age: 60 Admit Type: Outpatient Procedure:                Upper GI endoscopy Indications:              Dysphagia Providers:                Norvel Richards, MD, Renda Rolls, RN, Georgeann Oppenheim, Technician Referring MD:             Elayne Snare. Luking (Referring MD) Medicines:                Propofol per Anesthesia Complications:            No immediate complications. Estimated Blood Loss:     Estimated blood loss: none. Procedure:                Pre-Anesthesia Assessment:                           - Prior to the procedure, a History and Physical                            was performed, and patient medications and                            allergies were reviewed. The patient's tolerance of                            previous anesthesia was also reviewed. The risks                            and benefits of the procedure and the sedation                            options and risks were discussed with the patient.                            All questions were answered, and informed consent                            was obtained. Prior Anticoagulants: The patient has                            taken no previous anticoagulant or antiplatelet                            agents. ASA Grade Assessment: III - A patient with                            severe systemic disease. After reviewing the risks  and benefits, the patient was deemed in                            satisfactory condition to undergo the procedure.                           After obtaining informed consent, the endoscope was                            passed under direct vision. Throughout the                            procedure, the patient's blood pressure, pulse, and   oxygen saturations were monitored continuously. The                            [id number]) was introduced through the                            mouth, and advanced to the second part of duodenum.                            The upper GI endoscopy was accomplished without                            difficulty. The patient tolerated the procedure                            well. Scope In: 10:23:05 AM Scope Out: 10:31:17 AM Total Procedure Duration: 0 hours 8 minutes 12 seconds  Findings:      The examined esophagus was normal. The scope was withdrawn. Dilation was       performed with a Maloney dilator with mild resistance at 56 Fr. The       dilation site was examined following endoscope reinsertion and showed no       change. Estimated blood loss: none.      A medium-sized hiatal hernia was present.      The exam was otherwise without abnormality.      The second portion of the duodenum was normal.      The exam of the stomach was otherwise normal. Impression:               - Normal esophagus. Dilated.                           - Medium-sized hiatal hernia.                           - The examination was otherwise normal.                           - Normal second portion of the duodenum.                           - No specimens collected. Moderate Sedation:      Per Anesthesia Care Recommendation:           -  Patient has a contact number available for                            emergencies. The signs and symptoms of potential                            delayed complications were discussed with the                            patient. Return to normal activities tomorrow.                            Written discharge instructions were provided to the                            patient.                           - Resume previous diet.                           - Continue present medications.                           - Return to GI office in 2 months. See colonoscopy                             report. Procedure Code(s):        --- Professional ---                           [phone], Esophagogastroduodenoscopy, flexible,                            transoral; diagnostic, including collection of                            specimen(s) by brushing or washing, when performed                            (separate procedure)                           43450, Dilation of esophagus, by unguided sound or                            bougie, single or multiple passes Diagnosis Code(s):        --- Professional ---                           K44.9, Diaphragmatic hernia without obstruction or                            gangrene                           R13.10, Dysphagia, unspecified CPT copyright 2016  American Medical Association. All rights reserved. The codes documented in this report are preliminary and upon coder review may  be revised to meet current compliance requirements. [name]. Sanvika Cuttino, MD Norvel Richards, MD 09/15/2015 10:58:13 AM This report has been signed electronically. Number of Addenda: 0

## 2015-09-15 NOTE — Discharge Instructions (Signed)
Colonoscopy Discharge Instructions  Read the instructions outlined below and refer to this sheet in the next few weeks. These discharge instructions provide you with general information on caring for yourself after you leave the hospital. Your doctor may also give you specific instructions. While your treatment has been planned according to the most current medical practices available, unavoidable complications occasionally occur. If you have any problems or questions after discharge, call Dr. [name] at [phone]. ACTIVITY  You may resume your regular activity, but move at a slower pace for the next 24 hours.   Take frequent rest periods for the next 24 hours.   Walking will help get rid of the air and reduce the bloated feeling in your belly (abdomen).   No driving for 24 hours (because of the medicine (anesthesia) used during the test).    Do not sign any important legal documents or operate any machinery for 24 hours (because of the anesthesia used during the test).  NUTRITION  Drink plenty of fluids.   You may resume your normal diet as instructed by your doctor.   Begin with a light meal and progress to your normal diet. Heavy or fried foods are harder to digest and may make you feel sick to your stomach (nauseated).   Avoid alcoholic beverages for 24 hours or as instructed.  MEDICATIONS  You may resume your normal medications unless your doctor tells you otherwise.  WHAT YOU CAN EXPECT TODAY  Some feelings of bloating in the abdomen.   Passage of more gas than usual.   Spotting of blood in your stool or on the toilet paper.  IF YOU HAD POLYPS REMOVED DURING THE COLONOSCOPY:  No aspirin products for 7 days or as instructed.   No alcohol for 7 days or as instructed.   Eat a soft diet for the next 24 hours.  FINDING OUT THE RESULTS OF YOUR TEST Not all test results are available during your visit. If your test results are not back during the visit, make an appointment  with your caregiver to find out the results. Do not assume everything is normal if you have not heard from your caregiver or the medical facility. It is important for you to follow up on all of your test results.  SEEK IMMEDIATE MEDICAL ATTENTION IF:  You have more than a spotting of blood in your stool.   Your belly is swollen (abdominal distention).   You are nauseated or vomiting.   You have a temperature over 101.  You have abdominal pain or discomfort that is severe or gets worse throughout the day. EGD Discharge instructions Please read the instructions outlined below and refer to this sheet in the next few weeks. These discharge instructions provide you with general information on caring for yourself after you leave the hospital. Your doctor may also give you specific instructions. While your treatment has been planned according to the most current medical practices available, unavoidable complications occasionally occur. If you have any problems or questions after discharge, please call your doctor. ACTIVITY You may resume your regular activity but move at a slower pace for the next 24 hours.  Take frequent rest periods for the next 24 hours.  Walking will help expel (get rid of) the air and reduce the bloated feeling in your abdomen.  No driving for 24 hours (because of the anesthesia (medicine) used during the test).  You may shower.  Do not sign any important legal documents or operate any machinery for 24  hours (because of the anesthesia used during the test).  NUTRITION Drink plenty of fluids.  You may resume your normal diet.  Begin with a light meal and progress to your normal diet.  Avoid alcoholic beverages for 24 hours or as instructed by your caregiver.  MEDICATIONS You may resume your normal medications unless your caregiver tells you otherwise.  WHAT YOU CAN EXPECT TODAY You may experience abdominal discomfort such as a feeling of fullness or gas pains.   FOLLOW-UP Your doctor will discuss the results of your test with you.  SEEK IMMEDIATE MEDICAL ATTENTION IF ANY OF THE FOLLOWING OCCUR: Excessive nausea (feeling sick to your stomach) and/or vomiting.  Severe abdominal pain and distention (swelling).  Trouble swallowing.  Temperature over 101 F (37.8 C).  Rectal bleeding or vomiting of blood.    GERD, diverticulosis and colon polyp information provided  Continue Protonix daily  Further recommendations to follow pending review of pathology report  Colon Polyps Polyps are lumps of extra tissue growing inside the body. Polyps can grow in the large intestine (colon). Most colon polyps are noncancerous (benign). However, some colon polyps can become cancerous over time. Polyps that are larger than a pea may be harmful. To be safe, caregivers remove and test all polyps. CAUSES  Polyps form when mutations in the genes cause your cells to grow and divide even though no more tissue is needed. RISK FACTORS There are a number of risk factors that can increase your chances of getting colon polyps. They include:  Being older than 50 years.  Family history of colon polyps or colon cancer.  Long-term colon diseases, such as colitis or Crohn disease.  Being overweight.  Smoking.  Being inactive.  Drinking too much alcohol. SYMPTOMS  Most small polyps do not cause symptoms. If symptoms are present, they may include:  Blood in the stool. The stool may look dark red or black.  Constipation or diarrhea that lasts longer than 1 week. DIAGNOSIS People often do not know they have polyps until their caregiver finds them during a regular checkup. Your caregiver can use 4 tests to check for polyps:  Digital rectal exam. The caregiver wears gloves and feels inside the rectum. This test would find polyps only in the rectum.  Barium enema. The caregiver puts a liquid called barium into your rectum before taking X-rays of your colon. Barium makes  your colon look white. Polyps are dark, so they are easy to see in the X-ray pictures.  Sigmoidoscopy. A thin, flexible tube (sigmoidoscope) is placed into your rectum. The sigmoidoscope has a light and tiny camera in it. The caregiver uses the sigmoidoscope to look at the last third of your colon.  Colonoscopy. This test is like sigmoidoscopy, but the caregiver looks at the entire colon. This is the most common method for finding and removing polyps. TREATMENT  Any polyps will be removed during a sigmoidoscopy or colonoscopy. The polyps are then tested for cancer. PREVENTION  To help lower your risk of getting more colon polyps:  Eat plenty of fruits and vegetables. Avoid eating fatty foods.  Do not smoke.  Avoid drinking alcohol.  Exercise every day.  Lose weight if recommended by your caregiver.  Eat plenty of calcium and folate. Foods that are rich in calcium include milk, cheese, and broccoli. Foods that are rich in folate include chickpeas, kidney beans, and spinach. HOME CARE INSTRUCTIONS Keep all follow-up appointments as directed by your caregiver. You may need periodic exams to check  for polyps. SEEK MEDICAL CARE IF: You notice bleeding during a bowel movement.   This information is not intended to replace advice given to you by your health care provider. Make sure you discuss any questions you have with your health care provider.   Document Released: 02/25/2004 Document Revised: 06/21/2014 Document Reviewed: 08/10/2011 Elsevier Interactive Patient Education 2016 [employer].   Gastroesophageal Reflux Disease, Adult Normally, food travels down the esophagus and stays in the stomach to be digested. However, when a person has gastroesophageal reflux disease (GERD), food and stomach acid move back up into the esophagus. When this happens, the esophagus becomes sore and inflamed. Over time, GERD can create small holes (ulcers) in the lining of the esophagus.  CAUSES This  condition is caused by a problem with the muscle between the esophagus and the stomach (lower esophageal sphincter, or LES). Normally, the LES muscle closes after food passes through the esophagus to the stomach. When the LES is weakened or abnormal, it does not close properly, and that allows food and stomach acid to go back up into the esophagus. The LES can be weakened by certain dietary substances, medicines, and medical conditions, including:  Tobacco use.  Pregnancy.  Having a hiatal hernia.  Heavy alcohol use.  Certain foods and beverages, such as coffee, chocolate, onions, and peppermint. RISK FACTORS This condition is more likely to develop in:  People who have an increased body weight.  People who have connective tissue disorders.  People who use NSAID medicines. SYMPTOMS Symptoms of this condition include:  Heartburn.  Difficult or painful swallowing.  The feeling of having a lump in the throat.  Abitter taste in the mouth.  Bad breath.  Having a large amount of saliva.  Having an upset or bloated stomach.  Belching.  Chest pain.  Shortness of breath or wheezing.  Ongoing (chronic) cough or a night-time cough.  Wearing away of tooth enamel.  Weight loss. Different conditions can cause chest pain. Make sure to see your health care provider if you experience chest pain. DIAGNOSIS Your health care provider will take a medical history and perform a physical exam. To determine if you have mild or severe GERD, your health care provider may also monitor how you respond to treatment. You may also have other tests, including:  An endoscopy toexamine your stomach and esophagus with a small camera.  A test thatmeasures the acidity level in your esophagus.  A test thatmeasures how much pressure is on your esophagus.  A barium swallow or modified barium swallow to show the shape, size, and functioning of your esophagus. TREATMENT The goal of treatment is  to help relieve your symptoms and to prevent complications. Treatment for this condition may vary depending on how severe your symptoms are. Your health care provider may recommend:  Changes to your diet.  Medicine.  Surgery. HOME CARE INSTRUCTIONS Diet  Follow a diet as recommended by your health care provider. This may involve avoiding foods and drinks such as:  Coffee and tea (with or without caffeine).  Drinks that containalcohol.  Energy drinks and sports drinks.  Carbonated drinks or sodas.  Chocolate and cocoa.  Peppermint and mint flavorings.  Garlic and onions.  Horseradish.  Spicy and acidic foods, including peppers, chili powder, curry powder, vinegar, hot sauces, and barbecue sauce.  Citrus fruit juices and citrus fruits, such as oranges, lemons, and limes.  Tomato-based foods, such as red sauce, chili, salsa, and pizza with red sauce.  Fried and fatty  foods, such as donuts, french fries, potato chips, and high-fat dressings.  High-fat meats, such as hot dogs and fatty cuts of red and white meats, such as rib eye steak, sausage, ham, and bacon.  High-fat dairy items, such as whole milk, butter, and cream cheese.  Eat small, frequent meals instead of large meals.  Avoid drinking large amounts of liquid with your meals.  Avoid eating meals during the 2-3 hours before bedtime.  Avoid lying down right after you eat.  Do not exercise right after you eat. General Instructions  Pay attention to any changes in your symptoms.  Take over-the-counter and prescription medicines only as told by your health care provider. Do not take aspirin, ibuprofen, or other NSAIDs unless your health care provider told you to do so.  Do not use any tobacco products, including cigarettes, chewing tobacco, and e-cigarettes. If you need help quitting, ask your health care provider.  Wear loose-fitting clothing. Do not wear anything tight around your waist that causes  pressure on your abdomen.  Raise (elevate) the head of your bed 6 inches (15cm).  Try to reduce your stress, such as with yoga or meditation. If you need help reducing stress, ask your health care provider.  If you are overweight, reduce your weight to an amount that is healthy for you. Ask your health care provider for guidance about a safe weight loss goal.  Keep all follow-up visits as told by your health care provider. This is important. SEEK MEDICAL CARE IF:  You have new symptoms.  You have unexplained weight loss.  You have difficulty swallowing, or it hurts to swallow.  You have wheezing or a persistent cough.  Your symptoms do not improve with treatment.  You have a hoarse voice. SEEK IMMEDIATE MEDICAL CARE IF:  You have pain in your arms, neck, jaw, teeth, or back.  You feel sweaty, dizzy, or light-headed.  You have chest pain or shortness of breath.  You vomit and your vomit looks like blood or coffee grounds.  You faint.  Your stool is bloody or black.  You cannot swallow, drink, or eat.   This information is not intended to replace advice given to you by your health care provider. Make sure you discuss any questions you have with your health care provider.   Document Released: 03/10/2005 Document Revised: 02/19/2015 Document Reviewed: 09/25/2014 Elsevier Interactive Patient Education [employer].   Diverticulosis Diverticulosis is the condition that develops when small pouches (diverticula) form in the wall of your colon. Your colon, or large intestine, is where water is absorbed and stool is formed. The pouches form when the inside layer of your colon pushes through weak spots in the outer layers of your colon. CAUSES  No one knows exactly what causes diverticulosis. RISK FACTORS  Being older than 78. Your risk for this condition increases with age. Diverticulosis is rare in people younger than 40 years. By age 70, almost everyone has  it.  Eating a low-fiber diet.  Being frequently constipated.  Being overweight.  Not getting enough exercise.  Smoking.  Taking over-the-counter pain medicines, like aspirin and ibuprofen. SYMPTOMS  Most people with diverticulosis do not have symptoms. DIAGNOSIS  Because diverticulosis often has no symptoms, health care providers often discover the condition during an exam for other colon problems. In many cases, a health care provider will diagnose diverticulosis while using a flexible scope to examine the colon (colonoscopy). TREATMENT  If you have never developed an infection related to  diverticulosis, you may not need treatment. If you have had an infection before, treatment may include:  Eating more fruits, vegetables, and grains.  Taking a fiber supplement.  Taking a live bacteria supplement (probiotic).  Taking medicine to relax your colon. HOME CARE INSTRUCTIONS   Drink at least 6-8 glasses of water each day to prevent constipation.  Try not to strain when you have a bowel movement.  Keep all follow-up appointments. If you have had an infection before:  Increase the fiber in your diet as directed by your health care provider or dietitian.  Take a dietary fiber supplement if your health care provider approves.  Only take medicines as directed by your health care provider. SEEK MEDICAL CARE IF:   You have abdominal pain.  You have bloating.  You have cramps.  You have not gone to the bathroom in 3 days. SEEK IMMEDIATE MEDICAL CARE IF:   Your pain gets worse.  Yourbloating becomes very bad.  You have a fever or chills, and your symptoms suddenly get worse.  You begin vomiting.  You have bowel movements that are bloody or black. MAKE SURE YOU:  Understand these instructions.  Will watch your condition.  Will get help right away if you are not doing well or get worse.   This information is not intended to replace advice given to you by your  health care provider. Make sure you discuss any questions you have with your health care provider.   Document Released: 02/26/2004 Document Revised: 06/05/2013 Document Reviewed: 04/25/2013 Elsevier Interactive Patient Education [employer].

## 2015-09-15 NOTE — H&P (View-Only) (Signed)
Primary Care Physician:  Sallee Lange, MD Primary Gastroenterologist:  Dr. [name]  Pre-Procedure History & Physical: HPI:  [name] is a 60 y.o. male here for office follow-up. He feels hemorrhoid banding helped considerably. He has noted intermittent rectal bleeding. History of advanced colonic adenomas on prior colonoscopy  -  last colonoscopy over 2 years ago. Marland Kitchen He was originally due for a follow-up colonoscopy 3 years from now. Also, reports recurrent esophageal dysphagia.  No structural lesion found a prior EGD He responded nicely to empiric dilation previously.   Reflux symptoms well controlled on Protonix 40 mg twice daily. He continues to smoke. Denies alcohol or illicit drugs  Past Medical History  Diagnosis Date  . ASCVD (arteriosclerotic cardiovascular disease)     inferior MI with circumflex PTCA in 1993;cath in 2000-50% OM 1&2,30% LAD ;2008-25% LAD, 80% nondominant right ,60% small OM 2,EF of 55% with severe basilar inferior hypokinesis; stress nuclear study in 04/2008 moderate inferior scar with peri-infarction ischemic  . Syncope   . Hyperlipidemia   . Hypertension   . Tobacco abuse     50 pack years continuing at one halp pack daily  . COPD (chronic obstructive pulmonary disease) (HCC)     mild ;excercise induced hypoxemia by cp stress test ;asthma ,bronchitis,  . Restless leg syndrome   . Anxiety and depression   . GERD (gastroesophageal reflux disease)   . Inflammatory polyps of colon with rectal bleeding ([place])   . Carcinoma in situ of colon 2004    rectal polyp  . H. pylori infection 2004    treated  . Sleep apnea     does not use CPAP  . Gastritis 12/30/10    EGD Dr [name]  . Colitis, ischemic ([place]) 2011  . Tubular adenoma of colon 06/2009    Colonosocpy Dr [name]  . Leukocytosis     [name]  . Depression   . Anxiety   . Lung nodule 07/16/2011  . CAD (coronary artery disease)   . Diverticulosis   . Hemorrhoids   . Asthma   . Schatzki's ring    . Hiatal hernia     Past Surgical History  Procedure Laterality Date  . Colonoscopy w/ polypectomy  2004    rectal polyp with carcinoma in situ removed via colonoscopy  . Hand surgery      surgical intervention for injury of the fingers of the left hand many years ago  . Colonoscopy  06/2009    normal terminal ileum, segmental mild inflammation of sigmoid colon (bx unremarkable), polyp, tubular adenoma  . Esophagogastroduodenoscopy  02/2009    query Barrett's but bx negative  . Esophagogastroduodenoscopy  12/30/2010    [name],gastritis, dilated 60F, sm HH, 1 small ulcer, Duodenal erosions, benign bx  . Flexible sigmoidoscopy  12/30/2010     [name],; internal hemorrhoids, anal papilla  . Heart stent    . Nasal septoplasty w/ turbinoplasty  10/06/2011    Procedure: NASAL SEPTOPLASTY WITH TURBINATE REDUCTION;  Surgeon: Izora Gala, MD;  Location: [facility];  Service: ENT;  Laterality: Bilateral;  . Cardiac catheterization    . Coronary angioplasty with stent placement  01/26/2012    "1; total is now 2"  . Esophagogastroduodenoscopy (egd) with esophageal dilation N/A 09/12/2012    [id number] Schatzki's ring s/p Maloney dilator. Small hiatal hernia. negative path  . Cholecystectomy  2004  . Colonoscopy N/A 07/31/2013    Dr.Gautham Hewins- redundant anal canal hemorrhoids, colonic diverticulosis, tubular  adenoma  . Esophagogastroduodenoscopy (egd) with esophageal dilation N/A 07/31/2013    Dr. [name]- normal egd, s/p [facility] dilation empirically. Normal small bowel biopsies   . Hemorrhoid banding      Dr. [name]  . Left heart catheterization with coronary angiogram N/A 01/26/2012    Procedure: LEFT HEART CATHETERIZATION WITH CORONARY ANGIOGRAM;  Surgeon: [name], MD;  Location: [facility] CATH LAB;  Service: Cardiovascular;  Laterality: N/A;  . Percutaneous coronary stent intervention (pci-s) N/A 01/26/2012    Procedure: PERCUTANEOUS CORONARY STENT INTERVENTION (PCI-S);  Surgeon: [name], MD;  Location: [facility] CATH LAB;  Service: Cardiovascular;  Laterality: N/A;    Prior to Admission medications   Medication Sig Start Date End Date Taking? Authorizing Provider  albuterol (PROVENTIL) (2.5 MG/3ML) 0.083% nebulizer solution Take 2.5 mg by nebulization every 6 (six) hours as needed. For shortness of breath   Yes Historical Provider, MD  ALPRAZolam (XANAX) 1 MG tablet Take 1 tablet (1 mg total) by mouth 4 (four) times daily. 07/25/15  Yes Cloria Spring, MD  aspirin EC 81 MG tablet Take 81 mg by mouth daily.    Yes Historical Provider, MD  budesonide-formoterol (SYMBICORT) 80-4.5 MCG/ACT inhaler Inhale 2 puffs into the lungs 2 (two) times daily. 04/23/15  Yes [name], MD  dronabinol (MARINOL) 2.5 MG capsule Take 1 capsule (2.5 mg total) by mouth 2 (two) times daily. 12/27/13  Yes Orvil Feil, NP  FLUoxetine (PROZAC) 20 MG capsule Take 1 capsule (20 mg total) by mouth daily. 07/25/15  Yes Cloria Spring, MD  FLUoxetine (PROZAC) 40 MG capsule Take 1 capsule (40 mg total) by mouth daily. 07/25/15 07/24/16 Yes Cloria Spring, MD  gabapentin (NEURONTIN) 300 MG capsule Take 2 capsules (600 mg total) by mouth 3 (three) times daily. 07/25/15  Yes Cloria Spring, MD  HYDROcodone-acetaminophen (NORCO) 7.5-325 MG tablet Take 1 tablet by mouth 3 (three) times daily as needed for moderate pain. 07/24/15  Yes [name], MD  hydrocortisone (ANUSOL-HC) 2.5 % rectal cream Place 1 application rectally as needed. 07/23/13  Yes Orvil Feil, NP  lisinopril (PRINIVIL,ZESTRIL) 5 MG tablet Take 1 tablet (5 mg total) by mouth daily. 07/24/15  Yes [name], MD  metoprolol succinate (TOPROL-XL) 25 MG 24 hr tablet Take 1 tablet (25 mg total) by mouth daily. 07/24/15  Yes [name], MD  nitroGLYCERIN (NITROSTAT) 0.4 MG SL tablet Place 0.4 mg under the tongue every 5 (five) minutes as needed for chest pain.   Yes Historical Provider, MD  OLANZapine (ZYPREXA) 10 MG tablet Take 1 tablet (10 mg total) by  mouth at bedtime. 07/25/15 07/24/16 Yes Cloria Spring, MD  pantoprazole (PROTONIX) 40 MG tablet Take 1 tablet (40 mg total) by mouth 2 (two) times daily. 07/17/15  Yes Carlis Stable, NP  promethazine (PHENERGAN) 12.5 MG tablet Take 12.5 mg by mouth every 6 (six) hours as needed for nausea or vomiting.   Yes Historical Provider, MD  rOPINIRole (REQUIP) 0.5 MG tablet Take 1 tablet (0.5 mg total) by mouth at bedtime. 07/24/15  Yes [name], MD  vitamin B-12 (CYANOCOBALAMIN) 1000 MCG tablet Take 2,000 mcg by mouth daily.   Yes Historical Provider, MD  atorvastatin (LIPITOR) 80 MG tablet Take 1 tablet (80 mg total) by mouth daily. Patient not taking: Reported on 09/09/2015 05/12/15   Arnoldo Lenis, MD    Allergies as of 09/09/2015  . (No Known Allergies)    Family  History  Problem Relation Age of Onset  . Lung disease Father     deceased, black lung  . Heart disease Mother     blood clots  . Depression Mother   . Cancer Paternal Uncle     unknown type  . Colon cancer Neg Hx   . ADD / ADHD Neg Hx   . Alcohol abuse Neg Hx   . Drug abuse Neg Hx   . Anxiety disorder Neg Hx   . Bipolar disorder Neg Hx   . Dementia Neg Hx   . OCD Neg Hx   . Paranoid behavior Neg Hx   . Schizophrenia Neg Hx   . Physical abuse Neg Hx   . Sexual abuse Neg Hx   . Seizures Neg Hx   . Cancer Maternal Aunt     unknown type  . Kidney failure Maternal Uncle   . Hypertension Brother   . Colon cancer Neg Hx     Social History   Social History  . Marital Status: Married    Spouse Name: N/A  . Number of Children: 3  . Years of Education: N/A   Occupational History  . disable     [employer]   Social History Main Topics  . Smoking status: Current Some Day Smoker -- 0.50 packs/day for 30 years    Types: Cigarettes  . Smokeless tobacco: Current User     Comment: now using vaporizer cigarettes  . Alcohol Use: 1.2 oz/week    2 Cans of beer per week     Comment: Drinks a beer occasionally  . Drug Use: No    . Sexual Activity: Not on file   Other Topics Concern  . Not on file   Social History Narrative   3 stepchildren    Review of Systems: See HPI, otherwise negative ROS  Physical Exam: BP 111/76 mmHg  Pulse 66  Temp(Src) 97.7 F (36.5 C)  Ht 5\' 10"  (1.778 m)  Wt 172 lb (78.019 kg)  BMI 24.68 kg/m2 General:    pleasant and cooperative in NAD Skin:  Intact without significant lesions or rashes. Eyes:  Sclera clear, no icterus.   Conjunctiva pink. Neck:  Supple; no masses or thyromegaly. No significant cervical adenopathy. Lungs:  Clear throughout to auscultation.   No wheezes, crackles, or rhonchi. No acute distress. Heart:  Regular rate and rhythm; no murmurs, clicks, rubs,  or gallops. Abdomen: Non-distended, normal bowel sounds.  Soft and nontender without appreciable mass or hepatosplenomegaly.  Pulses:  Normal pulses noted. Extremities:  Without clubbing or edema.  Impression:  Pleasant 60 year old gentleman with a history of GERD and dysphagia now with recurrent esophageal dysphagia. Reflux symptoms stated to be under good control with twice a day PPI therapy.  Intermittent rectal bleeding even after hemorrhoid banding. He feels his hemorrhoids have settled down considerably after bands were placed. History of advanced colon adenomas and adenomas found at each of his prior colonoscopies It's been over 2 years since he last had a colonoscopy. I feel it would be wise to clear his colon at this time rather than waiting another 3 years prior to performing a follow-up examination.  Recommendations:  I have offered patient both an EGD with dilation as appropriate as well as a surveillance/diagnostic colonoscopy in the near future.The risks, benefits, limitations, imponderables and alternatives regarding both EGD and colonoscopy have been reviewed with the patient. Questions have been answered. All parties agreeable.  He takes Xanax as well as opioids;  has  required considerable  conscious sedation medications previously. I feel he would be best served with deep sedation with the help of Dr. [facility].     Notice: This dictation was prepared with Dragon dictation along with smaller phrase technology. Any transcriptional errors that result from this process are unintentional and may not be corrected upon review.

## 2015-09-15 NOTE — Transfer of Care (Signed)
Immediate Anesthesia Transfer of Care Note  Patient: [name]  Procedure(s) Performed: Procedure(s) with comments: COLONOSCOPY WITH PROPOFOL (N/A) - 1015 ESOPHAGOGASTRODUODENOSCOPY (EGD) WITH PROPOFOL (N/A) MALONEY DILATION (N/A) POLYPECTOMY - Cecal polyp removed via cold snare/ Descending colon polyp removed via cold snare  Patient Location: PACU  Anesthesia Type:MAC  Level of Consciousness: sedated  Airway & Oxygen Therapy: Patient Spontanous Breathing and Patient connected to face mask oxygen  Post-op Assessment: Report given to RN and Post -op Vital signs reviewed and stable  Post vital signs: Reviewed and stable  Last Vitals:  Filed Vitals:   09/15/15 0945 09/15/15 0950  BP: 140/86   Temp:    Resp: 14 24    Complications: No apparent anesthesia complications

## 2015-09-15 NOTE — Anesthesia Preprocedure Evaluation (Signed)
Anesthesia Evaluation  Patient identified by MRN, date of birth, ID band Patient awake    Reviewed: Allergy & Precautions, H&P , NPO status , Patient's Chart, lab work & pertinent test results, reviewed documented beta blocker date and time   Airway Mallampati: II  TM Distance: >3 FB Neck ROM: Full    Dental no notable dental hx. (+) Teeth Intact, Dental Advisory Given   Pulmonary sleep apnea , COPD, Current Smoker,    Pulmonary exam normal breath sounds clear to auscultation       Cardiovascular hypertension, On Medications + CAD and + Past MI   Rhythm:Regular Rate:Normal     Neuro/Psych PSYCHIATRIC DISORDERS Anxiety Depression negative neurological ROS     GI/Hepatic Neg liver ROS, hiatal hernia, GERD  Medicated and Controlled,  Endo/Other  negative endocrine ROS  Renal/GU negative Renal ROS  negative genitourinary   Musculoskeletal   Abdominal   Peds  Hematology negative hematology ROS (+)   Anesthesia Other Findings   Reproductive/Obstetrics negative OB ROS                             Anesthesia Physical Anesthesia Plan  ASA: III  Anesthesia Plan: MAC   Post-op Pain Management:    Induction: Intravenous  Airway Management Planned: Simple Face Mask  Additional Equipment:   Intra-op Plan:   Post-operative Plan:   Informed Consent: I have reviewed the patients History and Physical, chart, labs and discussed the procedure including the risks, benefits and alternatives for the proposed anesthesia with the patient or authorized representative who has indicated his/her understanding and acceptance.     Plan Discussed with:   Anesthesia Plan Comments:         Anesthesia Quick Evaluation

## 2015-09-16 ENCOUNTER — Encounter: Payer: Self-pay | Admitting: Internal Medicine

## 2015-09-17 ENCOUNTER — Ambulatory Visit: Payer: Medicare Other

## 2015-09-17 ENCOUNTER — Encounter (HOSPITAL_COMMUNITY): Payer: Self-pay | Admitting: Internal Medicine

## 2015-09-17 ENCOUNTER — Other Ambulatory Visit: Payer: Self-pay | Admitting: Cardiology

## 2015-09-17 DIAGNOSIS — I739 Peripheral vascular disease, unspecified: Secondary | ICD-10-CM | POA: Diagnosis not present

## 2015-09-23 ENCOUNTER — Telehealth: Payer: Self-pay | Admitting: *Deleted

## 2015-09-23 NOTE — Telephone Encounter (Signed)
Pt aware, routed to pcp 

## 2015-09-23 NOTE — Telephone Encounter (Signed)
-----   Message from Arnoldo Lenis, MD sent at 09/19/2015  1:43 PM EDT ----- ABIs show normal circulation in his legs, his pains are not related to poor circulation. He should discuss further with his pcp other possible causes  Zandra Abts MD

## 2015-10-21 ENCOUNTER — Encounter: Payer: Self-pay | Admitting: Family Medicine

## 2015-10-21 ENCOUNTER — Ambulatory Visit (INDEPENDENT_AMBULATORY_CARE_PROVIDER_SITE_OTHER): Payer: Medicare Other | Admitting: Family Medicine

## 2015-10-21 VITALS — BP 128/78 | Ht 70.0 in | Wt 173.0 lb

## 2015-10-21 DIAGNOSIS — E785 Hyperlipidemia, unspecified: Secondary | ICD-10-CM

## 2015-10-21 DIAGNOSIS — F17200 Nicotine dependence, unspecified, uncomplicated: Secondary | ICD-10-CM | POA: Diagnosis not present

## 2015-10-21 DIAGNOSIS — G894 Chronic pain syndrome: Secondary | ICD-10-CM | POA: Diagnosis not present

## 2015-10-21 DIAGNOSIS — F418 Other specified anxiety disorders: Secondary | ICD-10-CM

## 2015-10-21 DIAGNOSIS — I10 Essential (primary) hypertension: Secondary | ICD-10-CM

## 2015-10-21 DIAGNOSIS — F341 Dysthymic disorder: Secondary | ICD-10-CM

## 2015-10-21 DIAGNOSIS — F5105 Insomnia due to other mental disorder: Secondary | ICD-10-CM | POA: Diagnosis not present

## 2015-10-21 DIAGNOSIS — I2510 Atherosclerotic heart disease of native coronary artery without angina pectoris: Secondary | ICD-10-CM

## 2015-10-21 MED ORDER — HYDROCODONE-ACETAMINOPHEN 7.5-325 MG PO TABS: 1.0000 | ORAL_TABLET | Freq: Three times a day (TID) | ORAL | Status: DC | PRN

## 2015-10-21 MED ORDER — GABAPENTIN 300 MG PO CAPS: 300.0000 mg | ORAL_CAPSULE | Freq: Three times a day (TID) | ORAL | Status: DC

## 2015-10-21 NOTE — Progress Notes (Signed)
   Subjective:    Patient ID: [name], male    DOB: 08-Mar-1956, 60 y.o.   MRN: [id number]  HPI This patient was seen today for chronic pain  The medication list was reviewed and updated.   -Compliance with medication: Takes pain medication daily.  - Number patient states they take daily: Patient states 2-3 times daily.  -when was the last dose patient took? Patient states last dose taken this morning  The patient was advised the importance of maintaining medication and not using illegal substances with these.  Refills needed: yes  The patient was educated that we can provide 3 monthly scripts for their medication, it is their responsibility to follow the instructions.  Side effects or complications from medications: None  Patient is aware that pain medications are meant to minimize the severity of the pain to allow their pain levels to improve to allow for better function. They are aware of that pain medications cannot totally remove their pain.  Due for UDT ( at least once per year) : 10/23/2015  Patient does take blood pressure medicine a regular basis tries eat healthy He does try to watch fats in his diet takes his cholesterol recent lab work looked good  he does state that his restless legs is doing well on the medication He sees psychiatry for chronic anxiety and depression he states it's doing well denies abusing Xanax     Review of Systems  Constitutional: Negative for activity change.  Gastrointestinal: Negative for vomiting and abdominal pain.  Neurological: Negative for weakness.  Psychiatric/Behavioral: Negative for confusion.       Objective:   Physical Exam  Constitutional: He appears well-nourished.  Cardiovascular: Normal rate, regular rhythm and normal heart sounds.   No murmur heard. Pulmonary/Chest: Effort normal and breath sounds normal.  Musculoskeletal: He exhibits no edema.  Lymphadenopathy:    He has no cervical adenopathy.    Neurological: He is alert.  Psychiatric: His behavior is normal.  Vitals reviewed.    25 minutes was spent with the patient. Greater than half the time was spent in discussion and answering questions and counseling regarding the issues that the patient came in for today.      Assessment & Plan:  The patient was seen today as part of a comprehensive visit regarding pain control. Patient's compliance with the medication as well as discussion regarding effectiveness was completed. Prescriptions were written. Patient was advised to follow-up in 3 months. The patient was assessed for any signs of severe side effects. The patient was advised to take the medicine as directed and to report to [place] if any side effect issues.   hypertension good control overall patient taking medication tolerating well. No angina symptoms. Hyperlipidemiarecent lab work reviewed with the patient patient doing well tolerating medication   I recommend reducing gabapentin to 1 tablet 3 times daily. I'm trying to lessen some of the medications that could cause drowsiness he denies interaction with all of his medicines. He does state that he will let us know if that does not go well. In addition to this he is using Xanax no more than 2 or 3 times per day but he knows not to take pain medicine and Xanax at the same time patient suffers with chronic anxiety issues

## 2015-10-21 NOTE — Addendum Note (Signed)
Addended by: Dairl Ponder on: 10/21/2015 01:03 PM   Modules accepted: Orders, Medications

## 2015-10-22 ENCOUNTER — Encounter (HOSPITAL_COMMUNITY): Payer: Self-pay | Admitting: Psychiatry

## 2015-10-22 ENCOUNTER — Ambulatory Visit (INDEPENDENT_AMBULATORY_CARE_PROVIDER_SITE_OTHER): Payer: Medicare Other | Admitting: Psychiatry

## 2015-10-22 VITALS — BP 132/80 | HR 65 | Ht 70.0 in | Wt 173.4 lb

## 2015-10-22 DIAGNOSIS — I2510 Atherosclerotic heart disease of native coronary artery without angina pectoris: Secondary | ICD-10-CM

## 2015-10-22 DIAGNOSIS — F418 Other specified anxiety disorders: Secondary | ICD-10-CM

## 2015-10-22 MED ORDER — FLUOXETINE HCL 20 MG PO CAPS: 20.0000 mg | ORAL_CAPSULE | Freq: Every day | ORAL | Status: DC

## 2015-10-22 MED ORDER — OLANZAPINE 10 MG PO TABS: 10.0000 mg | ORAL_TABLET | Freq: Every day | ORAL | Status: DC

## 2015-10-22 MED ORDER — FLUOXETINE HCL 40 MG PO CAPS: 40.0000 mg | ORAL_CAPSULE | Freq: Every day | ORAL | Status: DC

## 2015-10-22 MED ORDER — ALPRAZOLAM 1 MG PO TABS: 1.0000 mg | ORAL_TABLET | Freq: Four times a day (QID) | ORAL | Status: DC

## 2015-10-22 NOTE — Progress Notes (Signed)
Patient ID: [name], male   DOB: 1955-10-02, 60 y.o.   MRN: [id number] Patient ID: [name], male   DOB: 10/28/1955, 60 y.o.   MRN: [id number] Patient ID: [name], male   DOB: 1956-02-17, 60 y.o.   MRN: [id number] Patient ID: [name], male   DOB: May 05, 1956, 60 y.o.   MRN: [id number] Patient ID: [name], male   DOB: 09-01-1955, 60 y.o.   MRN: [id number] Patient ID: [name], male   DOB: [birth year], 60 y.o.   MRN: [id number] Patient ID: [name], male   DOB: 07/04/1955, 60 y.o.   MRN: [id number] Patient ID: [name], male   DOB: 05-15-1956, 60 y.o.   MRN: [id number] Patient ID: [name], male   DOB: 1955/08/09, 60 y.o.   MRN: [id number] Patient ID: [name], male   DOB: April 15, 1956, 60 y.o.   MRN: [id number] Patient ID: [name], male   DOB: 1956/04/11, 60 y.o.   MRN: [id number] Patient ID: [name], male   DOB: 06-05-1956, 60 y.o.   MRN: [id number] Patient ID: [name], male   DOB: Jul 29, 1955, 60 y.o.   MRN: [id number] [facility] [id number] Progress Note [name] MRN: [id number] DOB: 1955/06/15 Age: 60 y.o.  Date: 10/22/2015 Start Time: 9:50 AM End Time: 10:05 AM  Chief Complaint: Chief Complaint  Patient presents with  . Depression  . Anxiety  . Follow-up    Subjective: "I'm doing a little better"  This patient is a 60 year old married white male lives with his wife in [place]. He is on disability for coronary artery disease and COPD. He used to work for the [employer] but has not worked for the last 6 years. He has 3 step children 1 adopted child and several grandchildren.  The patient states he's been depressed and anxious primarily since he stopped working 6 years ago. He's also been through several deaths in his family his brother died suddenly 4 years ago. Following that his sister-in-law died in his home 8 months after his brother died. The patient's wife was caring for his wife's mother and she died last month at the  age of 64. All of these things are disturbing to him. He's had trouble sleeping because he is thinking about the deaths. He's more depressed but not suicidal. He's not eating well and has lost about 20 pounds. He also has restless legs and chronic pain in his legs which keep him awake at night. He enjoys hunting and fishing but is less these interests recently.  The patient returns after 3 months. He states he is doing somewhat better. His daughter is out of jail and gave birth to a baby boy 5 weeks ago. They're living with him and his wife and so far she is doing well and staying off drugs. The patient states that his mood is been better since we increased the Prozac and he is sleeping well at night. His anxiety is well controlled on Xanax. He no longer has any suicidal ideation  Diagnosis:   Axis I: Anxiety Disorder NOS and Major Depression, Recurrent severe Axis II: Deferred Axis III:  Past Medical History  Diagnosis Date  . ASCVD (arteriosclerotic cardiovascular disease)     inferior MI with circumflex PTCA in 1993;cath in 2000-50% OM 1&2,30% LAD ;2008-25% LAD, 80% nondominant right ,60% small OM 2,EF of 55% with severe basilar inferior hypokinesis; stress nuclear study in 04/2008 moderate inferior scar with  peri-infarction ischemic  . Syncope   . Hyperlipidemia   . Hypertension   . Tobacco abuse     50 pack years continuing at one halp pack daily  . COPD (chronic obstructive pulmonary disease) (HCC)     mild ;excercise induced hypoxemia by cp stress test ;asthma ,bronchitis,  . Restless leg syndrome   . Anxiety and depression   . GERD (gastroesophageal reflux disease)   . Inflammatory polyps of colon with rectal bleeding ([place])   . Carcinoma in situ of colon 2004    rectal polyp  . H. pylori infection 2004    treated  . Sleep apnea     does not use CPAP  . Gastritis 12/30/10    EGD Dr [name]  . Colitis, ischemic ([place]) 2011  . Tubular adenoma of colon 06/2009    Colonosocpy Dr [name]   . Leukocytosis     [name]  . Depression   . Anxiety   . Lung nodule 07/16/2011  . CAD (coronary artery disease)   . Diverticulosis   . Hemorrhoids   . Asthma   . Schatzki's ring   . Hiatal hernia    Axis IV: other psychosocial or environmental problems Axis V: 41-50 serious symptoms  ADL's:  Intact  Sleep: Fair  Appetite:  Poor  Allergies: No Known Allergies Medical History: Past Medical History  Diagnosis Date  . ASCVD (arteriosclerotic cardiovascular disease)     inferior MI with circumflex PTCA in 1993;cath in 2000-50% OM 1&2,30% LAD ;2008-25% LAD, 80% nondominant right ,60% small OM 2,EF of 55% with severe basilar inferior hypokinesis; stress nuclear study in 04/2008 moderate inferior scar with peri-infarction ischemic  . Syncope   . Hyperlipidemia   . Hypertension   . Tobacco abuse     50 pack years continuing at one halp pack daily  . COPD (chronic obstructive pulmonary disease) (HCC)     mild ;excercise induced hypoxemia by cp stress test ;asthma ,bronchitis,  . Restless leg syndrome   . Anxiety and depression   . GERD (gastroesophageal reflux disease)   . Inflammatory polyps of colon with rectal bleeding ([place])   . Carcinoma in situ of colon 2004    rectal polyp  . H. pylori infection 2004    treated  . Sleep apnea     does not use CPAP  . Gastritis 12/30/10    EGD Dr [name]  . Colitis, ischemic ([place]) 2011  . Tubular adenoma of colon 06/2009    Colonosocpy Dr [name]  . Leukocytosis     [name]  . Depression   . Anxiety   . Lung nodule 07/16/2011  . CAD (coronary artery disease)   . Diverticulosis   . Hemorrhoids   . Asthma   . Schatzki's ring   . Hiatal hernia    Surgical History: Past Surgical History  Procedure Laterality Date  . Colonoscopy w/ polypectomy  2004    rectal polyp with carcinoma in situ removed via colonoscopy  . Hand surgery      surgical intervention for injury of the fingers of the left hand many years ago   . Colonoscopy  06/2009    normal terminal ileum, segmental mild inflammation of sigmoid colon (bx unremarkable), polyp, tubular adenoma  . Esophagogastroduodenoscopy  02/2009    query Barrett's but bx negative  . Esophagogastroduodenoscopy  12/30/2010    [name],gastritis, dilated 9F, sm HH, 1 small ulcer, Duodenal erosions, benign bx  . Flexible sigmoidoscopy  12/30/2010  [name],; internal hemorrhoids, anal papilla  . Heart stent    . Nasal septoplasty w/ turbinoplasty  10/06/2011    Procedure: NASAL SEPTOPLASTY WITH TURBINATE REDUCTION;  Surgeon: Izora Gala, MD;  Location: [facility];  Service: ENT;  Laterality: Bilateral;  . Cardiac catheterization    . Coronary angioplasty with stent placement  01/26/2012    "1; total is now 2"  . Esophagogastroduodenoscopy (egd) with esophageal dilation N/A 09/12/2012    [id number] Schatzki's ring s/p Maloney dilator. Small hiatal hernia. negative path  . Cholecystectomy  2004  . Colonoscopy N/A 07/31/2013    Dr.Rourk- redundant anal canal hemorrhoids, colonic diverticulosis, tubular adenoma  . Esophagogastroduodenoscopy (egd) with esophageal dilation N/A 07/31/2013    Dr. [name]- normal egd, s/p [facility] dilation empirically. Normal small bowel biopsies   . Hemorrhoid banding      Dr. [name]  . Left heart catheterization with coronary angiogram N/A 01/26/2012    Procedure: LEFT HEART CATHETERIZATION WITH CORONARY ANGIOGRAM;  Surgeon: [name], MD;  Location: [facility] CATH LAB;  Service: Cardiovascular;  Laterality: N/A;  . Percutaneous coronary stent intervention (pci-s) N/A 01/26/2012    Procedure: PERCUTANEOUS CORONARY STENT INTERVENTION (PCI-S);  Surgeon: [name], MD;  Location: [facility] CATH LAB;  Service: Cardiovascular;  Laterality: N/A;  . Colonoscopy with propofol N/A 09/15/2015    Procedure: COLONOSCOPY WITH PROPOFOL;  Surgeon: [name], MD;  Location: [facility];  Service: Endoscopy;  Laterality: N/A;  1015  .  Esophagogastroduodenoscopy (egd) with propofol N/A 09/15/2015    Procedure: ESOPHAGOGASTRODUODENOSCOPY (EGD) WITH PROPOFOL;  Surgeon: [name], MD;  Location: [facility];  Service: Endoscopy;  Laterality: N/A;  [name] dilation N/A 09/15/2015    Procedure: [name] DILATION;  Surgeon: [name], MD;  [facility]: [facility];  Service: Endoscopy;  Laterality: N/A;  . Polypectomy  09/15/2015    Procedure: POLYPECTOMY;  Surgeon: [name], MD;  Location: [facility];  Service: Endoscopy;;  Cecal polyp removed via cold snare/ Descending colon polyp removed via cold snare   Family History: family history includes Cancer in his maternal aunt and paternal uncle; Depression in his mother; Heart disease in his mother; Hypertension in his brother; Kidney failure in his maternal uncle; Lung disease in his father. There is no history of Colon cancer, ADD / ADHD, Alcohol abuse, Drug abuse, Anxiety disorder, Bipolar disorder, Dementia, OCD, Paranoid behavior, Schizophrenia, Physical abuse, Sexual abuse, Seizures, or Colon cancer. Reviewed and nothing new today.  Suicidal Ideation:  Pt denies Homicidal Ideation:  Pt has some struggles with his daughter's boy friend.  He stays away from him. AEB (as evidenced by):per pt report  Psychiatric Specialty Exam: ROS Neuro: no headaches, ataxia, weakness, gives out easily GI: no N/V/cramps/constipation, he keeps diarrhea for the past 6 or 7 years MS: no weakness or aches. He catches cramps really bad.   Blood pressure 132/80, pulse 65, height 5\' 10"  (1.778 m), weight 173 lb 6.4 oz (78.654 kg), SpO2 94 %.Body mass index is 24.88 kg/(m^2).  General Appearance: Casual  Eye Contact::  Fair  Speech:  Clear and Coherent  Volume:  Normal  Mood: good  Affect: Bright   Thought Process:  Coherent, Linear and Logical  Orientation:  Full (Time, Place, and Person)  Thought Content:  Within normal limits   Suicidal Thoughts:  no  Homicidal Thoughts:  No   Memory:  Immediate;   Fair Recent;   Fair Remote;   Fair  Judgement:  Fair  Insight:  Fair  Psychomotor Activity:  Normal  Concentration:  Fair  Recall:  Fair  Akathisia:  No  Handed:  Right  AIMS (if indicated):     Assets:  Communication Skills Desire for Improvement  Sleep:      Current Medications: Zyprexa 10 mg at bed time Xanax 1 mg TID Neurontin 600 mg 3 times a day Prozac 40 mg per day Lab Results:  No results found for this or any previous visit (from the past 48 hour(s)). Marland Kitchen  Physical Findings: AIMS:  , ,  ,  ,    CIWA:    COWS:     Treatment Plan Summary: Medication management  Plan: I took his vitals.  I reviewed CC, tobacco/med/surg Hx, meds effects/ side effects, problem list, therapies and responses as well as current situation/symptoms discussed options. He will continue Prozac  60 mg daily, Zyprexa for mood stabilization and Neurontin and Xanax for anxiety He'll return to see me in 3 months but call if symptoms worsen before that See orders and pt instructions for more details.  MEDICATIONS this encounter: Meds ordered this encounter  Medications  . DISCONTD: OLANZapine (ZYPREXA) 10 MG tablet    Sig: Take 1 tablet (10 mg total) by mouth at bedtime.    Dispense:  30 tablet    Refill:  2  . DISCONTD: FLUoxetine (PROZAC) 40 MG capsule    Sig: Take 1 capsule (40 mg total) by mouth daily.    Dispense:  30 capsule    Refill:  2  . DISCONTD: FLUoxetine (PROZAC) 20 MG capsule    Sig: Take 1 capsule (20 mg total) by mouth daily.    Dispense:  30 capsule    Refill:  2  . ALPRAZolam (XANAX) 1 MG tablet    Sig: Take 1 tablet (1 mg total) by mouth 4 (four) times daily.    Dispense:  120 tablet    Refill:  2  . OLANZapine (ZYPREXA) 10 MG tablet    Sig: Take 1 tablet (10 mg total) by mouth at bedtime.    Dispense:  90 tablet    Refill:  2  . FLUoxetine (PROZAC) 40 MG capsule    Sig: Take 1 capsule (40 mg total) by mouth daily.    Dispense:  90 capsule     Refill:  2  . FLUoxetine (PROZAC) 20 MG capsule    Sig: Take 1 capsule (20 mg total) by mouth daily.    Dispense:  90 capsule    Refill:  2    Medical Decision Making Problem Points:  Established problem, stable/improving (1), Review of last therapy session (1) and Review of psycho-social stressors (1) Data Points:  Review or order clinical lab tests (1) Review of medication regiment & side effects (2)  I certify that outpatient services furnished can reasonably be expected to improve the patient's condition.   ROSS, [facility] 10/22/2015, 10:26 AM

## 2015-11-17 ENCOUNTER — Other Ambulatory Visit: Payer: Self-pay | Admitting: Family Medicine

## 2016-01-17 ENCOUNTER — Other Ambulatory Visit (HOSPITAL_COMMUNITY): Payer: Self-pay | Admitting: Psychiatry

## 2016-01-17 DIAGNOSIS — F418 Other specified anxiety disorders: Secondary | ICD-10-CM

## 2016-01-17 DIAGNOSIS — F5105 Insomnia due to other mental disorder: Secondary | ICD-10-CM

## 2016-01-17 DIAGNOSIS — F17200 Nicotine dependence, unspecified, uncomplicated: Secondary | ICD-10-CM

## 2016-01-21 ENCOUNTER — Encounter: Payer: Self-pay | Admitting: Family Medicine

## 2016-01-21 ENCOUNTER — Ambulatory Visit (INDEPENDENT_AMBULATORY_CARE_PROVIDER_SITE_OTHER): Payer: Medicare Other | Admitting: Family Medicine

## 2016-01-21 VITALS — BP 128/82 | Ht 70.0 in | Wt 166.8 lb

## 2016-01-21 DIAGNOSIS — G894 Chronic pain syndrome: Secondary | ICD-10-CM | POA: Diagnosis not present

## 2016-01-21 DIAGNOSIS — K219 Gastro-esophageal reflux disease without esophagitis: Secondary | ICD-10-CM | POA: Diagnosis not present

## 2016-01-21 DIAGNOSIS — I10 Essential (primary) hypertension: Secondary | ICD-10-CM | POA: Diagnosis not present

## 2016-01-21 DIAGNOSIS — E785 Hyperlipidemia, unspecified: Secondary | ICD-10-CM | POA: Diagnosis not present

## 2016-01-21 DIAGNOSIS — F17200 Nicotine dependence, unspecified, uncomplicated: Secondary | ICD-10-CM | POA: Diagnosis not present

## 2016-01-21 DIAGNOSIS — I2510 Atherosclerotic heart disease of native coronary artery without angina pectoris: Secondary | ICD-10-CM

## 2016-01-21 DIAGNOSIS — Z79891 Long term (current) use of opiate analgesic: Secondary | ICD-10-CM

## 2016-01-21 MED ORDER — HYDROCODONE-ACETAMINOPHEN 7.5-325 MG PO TABS: 1.0000 | ORAL_TABLET | Freq: Two times a day (BID) | ORAL | 0 refills | Status: DC | PRN

## 2016-01-21 MED ORDER — LISINOPRIL 2.5 MG PO TABS: 2.5000 mg | ORAL_TABLET | Freq: Every day | ORAL | 12 refills | Status: DC

## 2016-01-21 NOTE — Patient Instructions (Addendum)
Narcotic medication treatment agreement-educational material and consent form. Your health problem-because you are having problems with pain you are being prescribed narcotic medication to help control your pain. The purpose of narcotic medication-narcotics are a type of drug that should help you with your pain and let you be more active in your daily life. It is not expected that your pain will go away completely. There are risks associated with these drugs and you can also have side effects. It is important for you to be honest with your doctor about your pain and the dose of medication you are taking. It is also important to be honest with your doctor about any potential problems or side effects with the medications that you are having. Risk and common problems-narcotic use has been associated with the following issues Addiction- there is a chance that you could become addicted to narcotic drugs. This means that you once the drug and will try very hard to get it, even if it causes you harm or other problems in your life. This chance is greater in people who are young, have mental illness, have been addicted to any drug in the past, or have a close relative that has been addicted to a drug in the past. Your doctor may tell you that you need tests or should see other health providers to help you avoid addiction. Allergic reaction - all kinds of allergic reactions can happen. You could have a minor reaction such as a rash or severe reaction such as swelling of your tongue or throat. A severe reaction as a medical emergency that can cause death. Incomplete relief of pain - narcotic medications do not take away all of your pain. Your doctor will work with you to try to optimize treatment but it is not reasonable to expect complete relief of pain. Low testosterone levels in men - narcotic drugs may cause the levels of the hormone testosterone to drop in men. This could change your mood and energy level. It may  also lessen the desire to have sex. Testosterone supplementation in this situation is not recommended. Physical dependence- you may not feel well if your dose is suddenly stopped. Some common symptoms of withdraw are runny nose, excessive yawning, goosebumps, nausea with stomach pains, diarrhea, body aches, and increased irritability. Side effects- there are many side effects of narcotic drugs. Constipation, nausea, vomiting, itching, dizziness are all potential side effects. Slowed breathing- excessive doses of narcotics can slow your breathing. Do not use other drugs or drink alcohol while taking narcotic drugs. This can cause accidental death. Follow directions on how the medication is prescribed. If you feel you are having problems then notify your doctor. Slowed reaction time- you may feel sleepy and be slow to react. If this happens, then you should not drive, use heavy machinery or guns, or be at unsafe heights, or be caring for someone else. Tolerance- your body could become use to the dose of narcotic drugs that your doctor tells you to take, and you may not get the same relief of pain that you had before. A higher dose may not help and could cause potential side effects. Increased risk of accidental death can occur due to the narcotic medication or side effects. If you are having any of the problems listed above you need to discuss these with your physician.  Other choices- you do not have to take narcotics. The decision to take narcotics for pain his ureters. There are other choices that you may choose.  There are several alternatives that may be helpful. These can be done in place of narcotics or in some cases along with narcotics. - Anti-inflammatories, antidepressants, and seizure medications can be helpful -Physical therapy, wearing a brace, surgical referral, home exercise regimens, could potentially help -Referral to a specialist-you may wish to see a specialist who specializes in pain  management -You can choose to do nothing and live with the pain you have -Your doctor will discuss with you your choices but the decision his ureters. How well any other treatment works will depend on your specific health problem. More facts-there may be local, [place], or federal laws that your doctor must follow with prescribing narcotic drugs. It is not clear if narcotic painkillers are good for you to take for a long period of time. You should discuss with your doctor often about the good and bad effects that these drugs may have on you.  You should not take narcotic drugs if you are pregnant. If you become pregnant notify your doctor right away. Narcotic drugs can raise a chance of having a miscarriage or having a baby born with a birth defect. Your baby can also be born addicted to the drug. Should you become pregnant you will have to discontinue narcotic use. Treatment agreement - by signing the consent form you agree that you understand the rules for taking narcotic drugs. If you do not follow these rules, then your doctor may refer you to a specialist, no longer prescribe pain medications for you, and release you/terminate you from his or her care. Drug safety-you must lock your drugs in a safe place. They must be kept away from children. We will also were review with you the right way to get rid of any extra drugs.  You may not sell, share, or let other people use your drugs. This is a crime and can cause overdoses. You may be asked to come to our office between scheduled appointments for random pill counts. Failure to comply with this will result in dismissal. Instructions for taking narcotic drugs-you are only to take pain medications that is prescribed by our office. Do not combine your pain medication with other physician narcotic pain medications or other peoples pain medications. Do not stop taking your narcotic suddenly.  Do not drive after a new pain drug is started or after a dose is  increased until you are sure it does not make you sleepy or confused. Do not try to cut or crush your drug unless told to do so. This could cause death. Your drug will be stopped if it is not helping enough or if it is showing signs of harm to you. You must tell your doctor about any new drugs or health problems. Your drug may not work well or may work differently if you have certain health problems.  You must tell your doctor about problems you have with any drugs prescribed or illegal. If you feel you're having an addiction problem with prescribed or illegal drugs you will discuss this with your doctor in order to be referred for treatment.  Your doctor reserves the right to limit other medications that can interact with pain medications. Prescriptions and refills- your narcotic drugs will be prescribed by our office only. You may not ask for pain drugs from any other doctors including emergency room doctors. Our office will decide how many refills you will be given and how often he will need to be seen in our office in order to get  them area at the very least every 3 month appointments are required. Never try to change a prescription. If you do this then it will be reported to the police. You will also be terminated from the practice. Your prescription will not be replaced if lost, stolen, or destroyed by accident. Patients on regular narcotics must keep their office visits on a regular basis-always every 3 months or less. It is at that appointment they will receive their prescriptions. Do not call for an additional month supply. An office visit is necessary. Appointments- you will keep all appointments with doctors, therapist, and counselors. If you miss your scheduled appointments often, then your doctor may slowly decrease your dose of narcotic drugs until you are no longer taking it. The dates when your prescription was filled at the pharmacy will be per 5. The state wide database will be checked at  standard visits to make sure the patient is not receiving pain prescriptions from other physicians.  Random drug testing for illegal substances will be standard. Your doctor may have you give urine, blood, hair, or saliva to run tests. If you have test results that are not normal then your doctor may slowly decrease your dose of narcotic drugs until you are no longer taking the medication or stop prescribing additional pain medications immediately. Refusal to do urine drug testing is basis for the practice no longer prescribe narcotic pain medications. Pain management specialist If your provider feels it is in your best interest to see a pain medicine specialist we will advise you have such and help you with the referral. Sometimes this referral is made because standard dosing of short acting medication is no longer keeping the patient's pain under reasonable control. Sometimes this is based upon a patient's health issue, and it is felt that pain management would best serve the patient's needs.  Once under the care of pain management we will no longer be prescribing the narcotic medications. This will be under the guidance of the pain management specialist. Further pain medication prescriptions will not be reassumed by this office once referred to pain management. In these situations we will continue to provide primary care but not pain medications.  Violations of the pain management agreement will result in our office no longer prescribing pain medications. In some situations it will also result in dismissal of the patient from our practice.  Health information-your doctor may need to discuss your treatment with pharmacists or other providers. Legal authorities may ask for your pain treatment records. If this happens then records will be given to them up on proper documentation/release.  You should also be aware that properly taking pain medications is very important in regards to operating a vehicle.  Even with following proper prescriptions instructions it is possible to be charged with operating a vehicle under influence. It is highly important that if you feel drowsy or drug that you do not operate a motor vehicle for the safety of yourself and others.  Steps to Quit Smoking  Smoking tobacco can be harmful to your health and can affect almost every organ in your body. Smoking puts you, and those around you, at risk for developing many serious chronic diseases. Quitting smoking is difficult, but it is one of the best things that you can do for your health. It is never too late to quit. WHAT ARE THE BENEFITS OF QUITTING SMOKING? When you quit smoking, you lower your risk of developing serious diseases and conditions, such as:  Lung cancer  or lung disease, such as COPD.  Heart disease.  Stroke.  Heart attack.  Infertility.  Osteoporosis and bone fractures. Additionally, symptoms such as coughing, wheezing, and shortness of breath may get better when you quit. You may also find that you get sick less often because your body is stronger at fighting off colds and infections. If you are pregnant, quitting smoking can help to reduce your chances of having a baby of low birth weight. HOW DO I GET READY TO QUIT? When you decide to quit smoking, create a plan to make sure that you are successful. Before you quit:  Pick a date to quit. Set a date within the next two weeks to give you time to prepare.  Write down the reasons why you are quitting. Keep this list in places where you will see it often, such as on your bathroom mirror or in your car or wallet.  Identify the people, places, things, and activities that make you want to smoke (triggers) and avoid them. Make sure to take these actions:  Throw away all cigarettes at home, at work, and in your car.  Throw away smoking accessories, such as Scientist, research (medical).  Clean your car and make sure to empty the ashtray.  Clean your home,  including curtains and carpets.  Tell your family, friends, and coworkers that you are quitting. Support from your loved ones can make quitting easier.  Talk with your health care provider about your options for quitting smoking.  Find out what treatment options are covered by your health insurance. WHAT STRATEGIES CAN I USE TO QUIT SMOKING?  Talk with your healthcare provider about different strategies to quit smoking. Some strategies include:  Quitting smoking altogether instead of gradually lessening how much you smoke over a period of time. Research shows that quitting "cold [place]" is more successful than gradually quitting.  Attending in-person counseling to help you build problem-solving skills. You are more likely to have success in quitting if you attend several counseling sessions. Even short sessions of 10 minutes can be effective.  Finding resources and support systems that can help you to quit smoking and remain smoke-free after you quit. These resources are most helpful when you use them often. They can include:  Online chats with a Social worker.  Telephone quitlines.  Printed Furniture conservator/restorer.  Support groups or group counseling.  Text messaging programs.  Mobile phone applications.  Taking medicines to help you quit smoking. (If you are pregnant or breastfeeding, talk with your health care provider first.) Some medicines contain nicotine and some do not. Both types of medicines help with cravings, but the medicines that include nicotine help to relieve withdrawal symptoms. Your health care provider may recommend:  Nicotine patches, gum, or lozenges.  Nicotine inhalers or sprays.  Non-nicotine medicine that is taken by mouth. Talk with your health care provider about combining strategies, such as taking medicines while you are also receiving in-person counseling. Using these two strategies together makes you more likely to succeed in quitting than if you used either  strategy on its own. If you are pregnant or breastfeeding, talk with your health care provider about finding counseling or other support strategies to quit smoking. Do not take medicine to help you quit smoking unless told to do so by your health care provider. WHAT THINGS CAN I DO TO MAKE IT EASIER TO QUIT? Quitting smoking might feel overwhelming at first, but there is a lot that you can do to make it easier.  Take these important actions:  Reach out to your family and friends and ask that they support and encourage you during this time. Call telephone quitlines, reach out to support groups, or work with a counselor for support.  Ask people who smoke to avoid smoking around you.  Avoid places that trigger you to smoke, such as bars, parties, or smoke-break areas at work.  Spend time around people who do not smoke.  Lessen stress in your life, because stress can be a smoking trigger for some people. To lessen stress, try:  Exercising regularly.  Deep-breathing exercises.  Yoga.  Meditating.  Performing a body scan. This involves closing your eyes, scanning your body from head to toe, and noticing which parts of your body are particularly tense. Purposefully relax the muscles in those areas.  Download or purchase mobile phone or tablet apps (applications) that can help you stick to your quit plan by providing reminders, tips, and encouragement. There are many free apps, such as QuitGuide from the [employer] Office manager for Disease Control and Prevention). You can find other support for quitting smoking (smoking cessation) through smokefree.gov and other websites. HOW WILL I FEEL WHEN I QUIT SMOKING? Within the first 24 hours of quitting smoking, you may start to feel some withdrawal symptoms. These symptoms are usually most noticeable 2-3 days after quitting, but they usually do not last beyond 2-3 weeks. Changes or symptoms that you might experience include:  Mood swings.  Restlessness, anxiety,  or irritation.  Difficulty concentrating.  Dizziness.  Strong cravings for sugary foods in addition to nicotine.  Mild weight gain.  Constipation.  Nausea.  Coughing or a sore throat.  Changes in how your medicines work in your body.  A depressed mood.  Difficulty sleeping (insomnia). After the first 2-3 weeks of quitting, you may start to notice more positive results, such as:  Improved sense of smell and taste.  Decreased coughing and sore throat.  Slower heart rate.  Lower blood pressure.  Clearer skin.  The ability to breathe more easily.  Fewer sick days. Quitting smoking is very challenging for most people. Do not get discouraged if you are not successful the first time. Some people need to make many attempts to quit before they achieve long-term success. Do your best to stick to your quit plan, and talk with your health care provider if you have any questions or concerns.   This information is not intended to replace advice given to you by your health care provider. Make sure you discuss any questions you have with your health care provider.   Document Released: 05/25/2001 Document Revised: 10/15/2014 Document Reviewed: 10/15/2014 Elsevier Interactive Patient Education [employer].

## 2016-01-21 NOTE — Progress Notes (Signed)
   Subjective:    Patient ID: [name], male    DOB: January 02, 1956, 60 y.o.   MRN: [id number]  HPI  This patient was seen today for chronic pain  The medication list was reviewed and updated.   -Compliance with medication: yes  - Number patient states they take daily: 2-3 a day  -when was the last dose patient took? This am  The patient was advised the importance of maintaining medication and not using illegal substances with these.  Refills needed: yes  The patient was educated that we can provide 3 monthly scripts for their medication, it is their responsibility to follow the instructions.  Side effects or complications from medications: none  Patient is aware that pain medications are meant to minimize the severity of the pain to allow their pain levels to improve to allow for better function. They are aware of that pain medications cannot totally remove their pain.  Due for UDT ( at least once per year) : today       Review of Systems  Constitutional: Negative for activity change, appetite change and fatigue.  HENT: Negative for congestion.   Respiratory: Negative for cough.   Cardiovascular: Negative for chest pain.  Gastrointestinal: Negative for abdominal pain.  Endocrine: Negative for polydipsia and polyphagia.  Neurological: Negative for weakness.  Psychiatric/Behavioral: Negative for confusion.       Objective:   Physical Exam  Constitutional: He appears well-nourished. No distress.  Cardiovascular: Normal rate, regular rhythm and normal heart sounds.   No murmur heard. Pulmonary/Chest: Effort normal and breath sounds normal. No respiratory distress.  Musculoskeletal: He exhibits no edema.  Lymphadenopathy:    He has no cervical adenopathy.  Neurological: He is alert.  Psychiatric: His behavior is normal.  Vitals reviewed.         Assessment & Plan:  The patient was seen today as part of a comprehensive visit regarding pain control. Patient's  compliance with the medication as well as discussion regarding effectiveness was completed. Prescriptions were written. Patient was advised to follow-up in 3 months. The patient was assessed for any signs of severe side effects. The patient was advised to take the medicine as directed and to report to [place] if any side effect issues. I recommend to reduce his pain medicine, 2 per day, patient on a large amount of neuro medicines per psychiatry, patient denies causing drowsiness but he was warned that if he starts feeling drowsy not to operate any machinery or cars  Blood pressure good control no sign of any heart associated issues going on currently  Hyperlipidemia get lipid profile previous profile reviewed with patient  Reflux issues patient overall doing well on protonic states that he doesn't take it he has severe troubles  Patient was counseled to quit smoking.  25 minutes was spent with the patient. Greater than half the time was spent in discussion and answering questions and counseling regarding the issues that the patient came in for today.

## 2016-01-22 ENCOUNTER — Ambulatory Visit (INDEPENDENT_AMBULATORY_CARE_PROVIDER_SITE_OTHER): Payer: Medicare Other | Admitting: Psychiatry

## 2016-01-22 ENCOUNTER — Encounter (HOSPITAL_COMMUNITY): Payer: Self-pay | Admitting: Psychiatry

## 2016-01-22 VITALS — BP 125/80 | HR 62 | Ht 70.0 in | Wt 169.4 lb

## 2016-01-22 DIAGNOSIS — E785 Hyperlipidemia, unspecified: Secondary | ICD-10-CM | POA: Diagnosis not present

## 2016-01-22 DIAGNOSIS — I2510 Atherosclerotic heart disease of native coronary artery without angina pectoris: Secondary | ICD-10-CM | POA: Diagnosis not present

## 2016-01-22 DIAGNOSIS — F418 Other specified anxiety disorders: Secondary | ICD-10-CM

## 2016-01-22 DIAGNOSIS — F341 Dysthymic disorder: Secondary | ICD-10-CM | POA: Diagnosis not present

## 2016-01-22 DIAGNOSIS — F17200 Nicotine dependence, unspecified, uncomplicated: Secondary | ICD-10-CM | POA: Diagnosis not present

## 2016-01-22 DIAGNOSIS — F5105 Insomnia due to other mental disorder: Secondary | ICD-10-CM

## 2016-01-22 MED ORDER — OLANZAPINE 10 MG PO TABS: 10.0000 mg | ORAL_TABLET | Freq: Every day | ORAL | 2 refills | Status: DC

## 2016-01-22 MED ORDER — FLUOXETINE HCL 40 MG PO CAPS: 40.0000 mg | ORAL_CAPSULE | Freq: Every day | ORAL | 2 refills | Status: DC

## 2016-01-22 MED ORDER — FLUOXETINE HCL 20 MG PO CAPS: 20.0000 mg | ORAL_CAPSULE | Freq: Every day | ORAL | 2 refills | Status: DC

## 2016-01-22 MED ORDER — ALPRAZOLAM 1 MG PO TABS: 1.0000 mg | ORAL_TABLET | Freq: Four times a day (QID) | ORAL | 2 refills | Status: DC

## 2016-01-22 MED ORDER — GABAPENTIN 300 MG PO CAPS: 600.0000 mg | ORAL_CAPSULE | Freq: Three times a day (TID) | ORAL | 2 refills | Status: DC

## 2016-01-22 NOTE — Progress Notes (Signed)
Patient ID: [name], male   DOB: Dec 25, 1955, 60 y.o.   MRN: [id number] Patient ID: [name], male   DOB: [birth year], 60 y.o.   MRN: [id number] Patient ID: [name], male   DOB: Mar 23, 1956, 60 y.o.   MRN: [id number] Patient ID: [name], male   DOB: [birth year], 60 y.o.   MRN: [id number] Patient ID: [name], male   DOB: [birth year], 60 y.o.   MRN: [id number] Patient ID: [name], male   DOB: 1955-07-25, 60 y.o.   MRN: [id number] Patient ID: [name], male   DOB: Nov 23, 1955, 59 y.o.   MRN: [id number] Patient ID: [name], male   DOB: 02-Mar-1956, 60 y.o.   MRN: [id number] Patient ID: [name], male   DOB: Aug 26, 1955, 60 y.o.   MRN: [id number] Patient ID: [name], male   DOB: 01-31-1956, 60 y.o.   MRN: [id number] Patient ID: [name], male   DOB: Apr 10, 1956, 60 y.o.   MRN: [id number] Patient ID: [name], male   DOB: Jan 26, 1956, 60 y.o.   MRN: [id number] Patient ID: [name], male   DOB: 04/21/1956, 61 y.o.   MRN: [id number] [place] [id number] Progress Note [name] MRN: [id number] DOB: 12-29-1955 Age: 60 y.o.  Date: 01/22/2016 Start Time: 9:50 AM End Time: 10:05 AM  Chief Complaint: Chief Complaint  Patient presents with  . Depression  . Anxiety  . Follow-up    Subjective: "I'm doing a little better"  This patient is a 61 year old married white male lives with his wife in [place]. He is on disability for coronary artery disease and COPD. He used to work for the [employer] but has not worked for the last 6 years. He has 3 step children 1 adopted child and several grandchildren.  The patient states he's been depressed and anxious primarily since he stopped working 6 years ago. He's also been through several deaths in his family his brother died suddenly 4 years ago. Following that his sister-in-law died in his home 8 months after his brother died. The patient's wife was caring for his wife's mother and she died last month at the  age of 42. All of these things are disturbing to him. He's had trouble sleeping because he is thinking about the deaths. He's more depressed but not suicidal. He's not eating well and has lost about 20 pounds. He also has restless legs and chronic pain in his legs which keep him awake at night. He enjoys hunting and fishing but is less these interests recently.  The patient returns after 3 months. He states he is doing somewhat better. His daughter is out of jail and gave birth to a baby boy last May. She is now working at [employer] and seems to be staying away from drugs. His nerves are better now. He sleeping better and eating a little bit better as well. His primary doctors cut down his oxycodone to twice a day and I told him next time We would look at cutting down his Xanax from 4 times a day to 3 times a day and he voices agreement Diagnosis:   Axis I: Anxiety Disorder NOS and Major Depression, Recurrent severe Axis II: Deferred Axis III:  Past Medical History:  Diagnosis Date  . Anxiety   . Anxiety and depression   . ASCVD (arteriosclerotic cardiovascular disease)    inferior MI with circumflex PTCA in 1993;cath in 2000-50% OM 1&2,30% LAD ;2008-25%  LAD, 80% nondominant right ,60% small OM 2,EF of 55% with severe basilar inferior hypokinesis; stress nuclear study in 04/2008 moderate inferior scar with peri-infarction ischemic  . Asthma   . CAD (coronary artery disease)   . Carcinoma in situ of colon 2004   rectal polyp  . Colitis, ischemic ([place]) 2011  . COPD (chronic obstructive pulmonary disease) (HCC)    mild ;excercise induced hypoxemia by cp stress test ;asthma ,bronchitis,  . Depression   . Diverticulosis   . Gastritis 12/30/10   EGD Dr [name]  . GERD (gastroesophageal reflux disease)   . H. pylori infection 2004   treated  . Hemorrhoids   . Hiatal hernia   . Hyperlipidemia   . Hypertension   . Inflammatory polyps of colon with rectal bleeding ([place])   . Leukocytosis    Dr  [name]  . Lung nodule 07/16/2011  . Restless leg syndrome   . Schatzki's ring   . Sleep apnea    does not use CPAP  . Syncope   . Tobacco abuse    50 pack years continuing at one halp pack daily  . Tubular adenoma of colon 06/2009   Colonosocpy Dr [name]   Axis IV: other psychosocial or environmental problems Axis V: 41-50 serious symptoms  ADL's:  Intact  Sleep: Fair  Appetite:  Poor  Allergies: Not on File Medical History: Past Medical History:  Diagnosis Date  . Anxiety   . Anxiety and depression   . ASCVD (arteriosclerotic cardiovascular disease)    inferior MI with circumflex PTCA in 1993;cath in 2000-50% OM 1&2,30% LAD ;2008-25% LAD, 80% nondominant right ,60% small OM 2,EF of 55% with severe basilar inferior hypokinesis; stress nuclear study in 04/2008 moderate inferior scar with peri-infarction ischemic  . Asthma   . CAD (coronary artery disease)   . Carcinoma in situ of colon 2004   rectal polyp  . Colitis, ischemic ([place]) 2011  . COPD (chronic obstructive pulmonary disease) (HCC)    mild ;excercise induced hypoxemia by cp stress test ;asthma ,bronchitis,  . Depression   . Diverticulosis   . Gastritis 12/30/10   EGD Dr [name]  . GERD (gastroesophageal reflux disease)   . H. pylori infection 2004   treated  . Hemorrhoids   . Hiatal hernia   . Hyperlipidemia   . Hypertension   . Inflammatory polyps of colon with rectal bleeding ([place])   . Leukocytosis    [name]  . Lung nodule 07/16/2011  . Restless leg syndrome   . Schatzki's ring   . Sleep apnea    does not use CPAP  . Syncope   . Tobacco abuse    50 pack years continuing at one halp pack daily  . Tubular adenoma of colon 06/2009   Colonosocpy Dr [name]   Surgical History: Past Surgical History:  Procedure Laterality Date  . CARDIAC CATHETERIZATION    . CHOLECYSTECTOMY  2004  . COLONOSCOPY  06/2009   normal terminal ileum, segmental mild inflammation of sigmoid colon (bx unremarkable),  polyp, tubular adenoma  . COLONOSCOPY N/A 07/31/2013   Dr.Rourk- redundant anal canal hemorrhoids, colonic diverticulosis, tubular adenoma  . COLONOSCOPY W/ POLYPECTOMY  2004   rectal polyp with carcinoma in situ removed via colonoscopy  . COLONOSCOPY WITH PROPOFOL N/A 09/15/2015   Procedure: COLONOSCOPY WITH PROPOFOL;  Surgeon: [name], MD;  [facility];  Service: Endoscopy;  Laterality: N/A;  1015  . CORONARY ANGIOPLASTY WITH STENT PLACEMENT  01/26/2012   "  1; total is now 2"  . ESOPHAGOGASTRODUODENOSCOPY  02/2009   query Barrett's but bx negative  . ESOPHAGOGASTRODUODENOSCOPY  12/30/2010   [name],gastritis, dilated 57F, sm HH, 1 small ulcer, Duodenal erosions, benign bx  . ESOPHAGOGASTRODUODENOSCOPY (EGD) WITH ESOPHAGEAL DILATION N/A 09/12/2012   [id number] Schatzki's ring s/p Maloney dilator. Small hiatal hernia. negative path  . ESOPHAGOGASTRODUODENOSCOPY (EGD) WITH ESOPHAGEAL DILATION N/A 07/31/2013   Dr. [name]- normal egd, s/p [facility] dilation empirically. Normal small bowel biopsies   . ESOPHAGOGASTRODUODENOSCOPY (EGD) WITH PROPOFOL N/A 09/15/2015   Procedure: ESOPHAGOGASTRODUODENOSCOPY (EGD) WITH PROPOFOL;  Surgeon: [name], MD;  [facility];  Service: Endoscopy;  Laterality: N/A;  . FLEXIBLE SIGMOIDOSCOPY  12/30/2010    [name],; internal hemorrhoids, anal papilla  . HAND SURGERY     surgical intervention for injury of the fingers of the left hand many years ago  . heart stent    . HEMORRHOID BANDING     Dr. [name]  . LEFT HEART CATHETERIZATION WITH CORONARY ANGIOGRAM N/A 01/26/2012   Procedure: LEFT HEART CATHETERIZATION WITH CORONARY ANGIOGRAM;  Surgeon: [name], MD;  Location: [facility] CATH LAB;  Service: Cardiovascular;  Laterality: N/A;  . [name] DILATION N/A 09/15/2015   Procedure: [name] DILATION;  Surgeon: [name], MD;  [facility];  Service: Endoscopy;  Laterality: N/A;  . NASAL SEPTOPLASTY W/  TURBINOPLASTY  10/06/2011   Procedure: NASAL SEPTOPLASTY WITH TURBINATE REDUCTION;  Surgeon: Izora Gala, MD;  Location: [facility];  Service: ENT;  Laterality: Bilateral;  . PERCUTANEOUS CORONARY STENT INTERVENTION (PCI-S) N/A 01/26/2012   Procedure: PERCUTANEOUS CORONARY STENT INTERVENTION (PCI-S);  Surgeon: [name], MD;  Location[facility] CATH LAB;  Service: Cardiovascular;  Laterality: N/A;  . POLYPECTOMY  09/15/2015   Procedure: POLYPECTOMY;  Surgeon: [name], MD;  [facility];  Service: Endoscopy;;  Cecal polyp removed via cold snare/ Descending colon polyp removed via cold snare   Family History: family history includes Cancer in his maternal aunt and paternal uncle; Depression in his mother; Heart disease in his mother; Hypertension in his brother; Kidney failure in his maternal uncle; Lung disease in his father. Reviewed and nothing new today.  Suicidal Ideation:  Pt denies Homicidal Ideation:  Pt has some struggles with his daughter's boy friend.  He stays away from him. AEB (as evidenced by):per pt report  Psychiatric Specialty Exam: ROS Neuro: no headaches, ataxia, weakness, gives out easily GI: no N/V/cramps/constipation, he keeps diarrhea for the past 6 or 7 years MS: no weakness or aches. He catches cramps really bad.   Blood pressure 125/80, pulse 62, height 5\' 10"  (1.778 m), weight 169 lb 6.4 oz (76.8 kg), SpO2 98 %.Body mass index is 24.31 kg/m.  General Appearance: Casual  Eye Contact::  Fair  Speech:  Clear and Coherent  Volume:  Normal  Mood: good  Affect: Bright   Thought Process:  Coherent, Linear and Logical  Orientation:  Full (Time, Place, and Person)  Thought Content:  Within normal limits   Suicidal Thoughts:  no  Homicidal Thoughts:  No  Memory:  Immediate;   Fair Recent;   Fair Remote;   Fair  Judgement:  Fair  Insight:  Fair  Psychomotor Activity:  Normal  Concentration:  Fair  Recall:  Fair  Akathisia:  No  Handed:  Right  AIMS  (if indicated):     Assets:  Communication Skills Desire for Improvement  Sleep:      Current  Medications: Zyprexa 10 mg at bed time Xanax 1 mg TID Neurontin 600 mg 3 times a day Prozac 40 mg per day Lab Results:  No results found for this or any previous visit (from the past 48 hour(s)). Marland Kitchen  Physical Findings: AIMS:  , ,  ,  ,    CIWA:    COWS:     Treatment Plan Summary: Medication management  Plan: I took his vitals.  I reviewed CC, tobacco/med/surg Hx, meds effects/ side effects, problem list, therapies and responses as well as current situation/symptoms discussed options. He will continue Prozac  60 mg daily, Zyprexa for mood stabilization and Neurontin and Xanax for anxiety He'll return to see me in 3 months but call if symptoms worsen before that See orders and pt instructions for more details.  MEDICATIONS this encounter: Meds ordered this encounter  Medications  . FLUoxetine (PROZAC) 20 MG capsule    Sig: Take 1 capsule (20 mg total) by mouth daily.    Dispense:  90 capsule    Refill:  2  . FLUoxetine (PROZAC) 40 MG capsule    Sig: Take 1 capsule (40 mg total) by mouth daily.    Dispense:  90 capsule    Refill:  2  . gabapentin (NEURONTIN) 300 MG capsule    Sig: Take 2 capsules (600 mg total) by mouth 3 (three) times daily.    Dispense:  180 capsule    Refill:  2  . DISCONTD: OLANZapine (ZYPREXA) 10 MG tablet    Sig: Take 1 tablet (10 mg total) by mouth at bedtime.    Dispense:  90 tablet    Refill:  2  . OLANZapine (ZYPREXA) 10 MG tablet    Sig: Take 1 tablet (10 mg total) by mouth at bedtime.    Dispense:  90 tablet    Refill:  2  . ALPRAZolam (XANAX) 1 MG tablet    Sig: Take 1 tablet (1 mg total) by mouth 4 (four) times daily.    Dispense:  120 tablet    Refill:  2    Medical Decision Making Problem Points:  Established problem, stable/improving (1), Review of last therapy session (1) and Review of psycho-social stressors (1) Data Points:  Review  or order clinical lab tests (1) Review of medication regiment & side effects (2)  I certify that outpatient services furnished can reasonably be expected to improve the patient's condition.   [name] 01/22/2016, 10:05 AM   Patient ID: DATAVION BRZOZOWSKI, male   DOB: Feb 08, 1956, 60 y.o.   MRN: [id number]

## 2016-01-23 ENCOUNTER — Encounter: Payer: Self-pay | Admitting: Family Medicine

## 2016-01-23 LAB — LIPID PANEL
Chol/HDL Ratio: 3.4 ratio units (ref 0.0–5.0)
Cholesterol, Total: 121 mg/dL (ref 100–199)
HDL: 36 mg/dL — AB (ref >39)
LDL Calculated: 60 mg/dL (ref 0–99)
Triglycerides: 125 mg/dL (ref 0–149)
VLDL Cholesterol Cal: 25 mg/dL (ref 5–40)

## 2016-01-28 LAB — TOXASSURE SELECT 13 (MW), URINE: PDF: 0

## 2016-02-02 ENCOUNTER — Telehealth: Payer: Self-pay | Admitting: *Deleted

## 2016-02-02 NOTE — Telephone Encounter (Signed)
See result note. Pt told to bring pill bottle so we could date two scripts for hydrocodone. Pt brought in script dated 01/21/16 that he had not filled. That script was shredded in office today because pill bottle was filled 01/19/16. The two other scripts dated 02/18/16 and 03/19/16 and given to pt. Pt states he  has appt in October before he runs out.

## 2016-03-11 ENCOUNTER — Encounter: Payer: Self-pay | Admitting: Cardiology

## 2016-03-11 ENCOUNTER — Ambulatory Visit (INDEPENDENT_AMBULATORY_CARE_PROVIDER_SITE_OTHER): Payer: Medicare Other | Admitting: Cardiology

## 2016-03-11 VITALS — BP 133/86 | HR 68 | Ht 70.0 in | Wt 171.0 lb

## 2016-03-11 DIAGNOSIS — R0602 Shortness of breath: Secondary | ICD-10-CM | POA: Diagnosis not present

## 2016-03-11 DIAGNOSIS — E785 Hyperlipidemia, unspecified: Secondary | ICD-10-CM

## 2016-03-11 DIAGNOSIS — I10 Essential (primary) hypertension: Secondary | ICD-10-CM

## 2016-03-11 DIAGNOSIS — I2510 Atherosclerotic heart disease of native coronary artery without angina pectoris: Secondary | ICD-10-CM | POA: Diagnosis not present

## 2016-03-11 DIAGNOSIS — G4730 Sleep apnea, unspecified: Secondary | ICD-10-CM | POA: Diagnosis not present

## 2016-03-11 DIAGNOSIS — F17200 Nicotine dependence, unspecified, uncomplicated: Secondary | ICD-10-CM

## 2016-03-11 MED ORDER — NICOTINE 21 MG/24HR TD PT24: 21.0000 mg | MEDICATED_PATCH | Freq: Every day | TRANSDERMAL | 0 refills | Status: DC

## 2016-03-11 MED ORDER — NICOTINE 14 MG/24HR TD PT24: 14.0000 mg | MEDICATED_PATCH | Freq: Every day | TRANSDERMAL | 0 refills | Status: DC

## 2016-03-11 MED ORDER — NICOTINE 7 MG/24HR TD PT24: 7.0000 mg | MEDICATED_PATCH | Freq: Every day | TRANSDERMAL | 0 refills | Status: DC

## 2016-03-11 NOTE — Patient Instructions (Signed)
Your physician wants you to follow-up in: [place] DR. BRANCH  You will receive a reminder letter in the mail two months in advance. If you don't receive a letter, please call our office to schedule the follow-up appointment.  Your physician has recommended you make the following change in your medication:   START NICODERM PATCH 21 MG DAILY FOR 6 WEEKS  14 MG DAILY FOR 2 WEEKS  7 MG FOR 2 WEEKS   Your physician has requested that you have an echocardiogram. Echocardiography is a painless test that uses sound waves to create images of your heart. It provides your doctor with information about the size and shape of your heart and how well your heart's chambers and valves are working. This procedure takes approximately one hour. There are no restrictions for this procedure.   You have been referred to DR. [facility]   Thank you for choosing [facility]!!

## 2016-03-11 NOTE — Progress Notes (Signed)
Clinical Summary Mr. Summar is a 60 y.o.male seen today for follow up of the following medical problems.   1. 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 LVEF 50-55% by LV gram.  08/2011 echo LVEF [id number], grade I diastolic dysfunction.    - occasional chest tightness with high temperatures. DOE at about 1 block, which stable.  - compliant with meds.   2. Hyperlipidemia - compliant with statin - 01/2016: TC 121 TG 125 HDL 36 LDL 60  3. HTN - compliant with meds - does not check regularly at home   4. COPD - followed by pcp   5. OSA - does not use CPAP machine due to discomfort  6. Tobacco - down to 1/2 ppd, working to quit. Chantix did not help.  - has some interest in nicotine patches.   7. GERD/Dysphagia - followed by GI  8. Leg pains - cramping pain bilateral calves and thighs at approx 1 block. Better with hydrodone. No sores on feet.  - 09/2015 ABIs normal    SH: enjoys working in yard, fishing.   Past Medical History:  Diagnosis Date  . Anxiety   . Anxiety and depression   . ASCVD (arteriosclerotic cardiovascular disease)    inferior MI with circumflex PTCA in 1993;cath in 2000-50% OM 1&2,30% LAD ;2008-25% LAD, 80% nondominant right ,60% small OM 2,EF of 55% with severe basilar inferior hypokinesis; stress nuclear study in 04/2008 moderate inferior scar with peri-infarction ischemic  . Asthma   . CAD (coronary artery disease)   . Carcinoma in situ of colon 2004   rectal polyp  . Colitis, ischemic ([place]) 2011  . COPD (chronic obstructive pulmonary disease) (HCC)    mild ;excercise induced hypoxemia by cp stress test ;asthma ,bronchitis,  . Depression   . Diverticulosis   . Gastritis 12/30/10   EGD Dr [name]  . GERD (gastroesophageal reflux disease)   . H. pylori infection 2004   treated  .  Hemorrhoids   . Hiatal hernia   . Hyperlipidemia   . Hypertension   . Inflammatory polyps of colon with rectal bleeding ([place])   . Leukocytosis    [name]  . Lung nodule 07/16/2011  . Restless leg syndrome   . Schatzki's ring   . Sleep apnea    does not use CPAP  . Syncope   . Tobacco abuse    50 pack years continuing at one halp pack daily  . Tubular adenoma of colon 06/2009   Colonosocpy Dr [name]     Not on File   Current Outpatient Prescriptions  Medication Sig Dispense Refill  . albuterol (PROVENTIL) (2.5 MG/3ML) 0.083% nebulizer solution Take 2.5 mg by nebulization every 6 (six) hours as needed. For shortness of breath    . ALPRAZolam (XANAX) 1 MG tablet Take 1 tablet (1 mg total) by mouth 4 (four) times daily. 120 tablet 2  . aspirin EC 81 MG tablet Take 81 mg by mouth daily.     Marland Kitchen atorvastatin (LIPITOR) 80 MG tablet Take 1 tablet (80 mg total) by mouth daily. 90 tablet 3  . budesonide-formoterol (SYMBICORT) 80-4.5 MCG/ACT inhaler Inhale 2 puffs into the lungs 2 (two) times daily. 1 Inhaler 12  . dronabinol (MARINOL) 2.5 MG capsule Take 1 capsule (2.5 mg total) by mouth  2 (two) times daily. 60 capsule 1  . FLUoxetine (PROZAC) 20 MG capsule Take 1 capsule (20 mg total) by mouth daily. 90 capsule 2  . FLUoxetine (PROZAC) 40 MG capsule Take 1 capsule (40 mg total) by mouth daily. 90 capsule 2  . gabapentin (NEURONTIN) 300 MG capsule Take 2 capsules (600 mg total) by mouth 3 (three) times daily. 180 capsule 2  . HYDROcodone-acetaminophen (NORCO) 7.5-325 MG tablet Take 1 tablet by mouth 2 (two) times daily as needed for moderate pain. 60 tablet 0  . hydrocortisone (ANUSOL-HC) 2.5 % rectal cream Place 1 application rectally as needed. Reported on 10/21/2015    . lisinopril (PRINIVIL,ZESTRIL) 2.5 MG tablet Take 1 tablet (2.5 mg total) by mouth daily. 30 tablet 12  . metoprolol succinate (TOPROL-XL) 25 MG 24 hr tablet TAKE 1 TABLET EVERY DAY 90 tablet 1  . nitroGLYCERIN  (NITROSTAT) 0.4 MG SL tablet Place 0.4 mg under the tongue every 5 (five) minutes as needed for chest pain.    Marland Kitchen OLANZapine (ZYPREXA) 10 MG tablet Take 1 tablet (10 mg total) by mouth at bedtime. 90 tablet 2  . pantoprazole (PROTONIX) 40 MG tablet Take 1 tablet (40 mg total) by mouth 2 (two) times daily. 180 tablet 3  . promethazine (PHENERGAN) 12.5 MG tablet Take 12.5 mg by mouth every 6 (six) hours as needed for nausea or vomiting. Reported on 10/21/2015    . rOPINIRole (REQUIP) 0.5 MG tablet Take 1 tablet (0.5 mg total) by mouth at bedtime. 90 tablet 3  . rOPINIRole (REQUIP) 0.5 MG tablet TAKE 1 TABLET EVERY DAY AT BEDTIME 90 tablet 1  . vitamin B-12 (CYANOCOBALAMIN) 1000 MCG tablet Take 2,000 mcg by mouth daily.     No current facility-administered medications for this visit.      Past Surgical History:  Procedure Laterality Date  . CARDIAC CATHETERIZATION    . CHOLECYSTECTOMY  2004  . COLONOSCOPY  06/2009   normal terminal ileum, segmental mild inflammation of sigmoid colon (bx unremarkable), polyp, tubular adenoma  . COLONOSCOPY N/A 07/31/2013   Dr.Rourk- redundant anal canal hemorrhoids, colonic diverticulosis, tubular adenoma  . COLONOSCOPY W/ POLYPECTOMY  2004   rectal polyp with carcinoma in situ removed via colonoscopy  . COLONOSCOPY WITH PROPOFOL N/A 09/15/2015   Procedure: COLONOSCOPY WITH PROPOFOL;  Surgeon: [name], MD;  [facility];  Service: Endoscopy;  Laterality: N/A;  1015  . CORONARY ANGIOPLASTY WITH STENT PLACEMENT  01/26/2012   "1; total is now 2"  . ESOPHAGOGASTRODUODENOSCOPY  02/2009   query Barrett's but bx negative  . ESOPHAGOGASTRODUODENOSCOPY  12/30/2010   [name],gastritis, dilated 60F, sm HH, 1 small ulcer, Duodenal erosions, benign bx  . ESOPHAGOGASTRODUODENOSCOPY (EGD) WITH ESOPHAGEAL DILATION N/A 09/12/2012   [id number] Schatzki's ring s/p Maloney dilator. Small hiatal hernia. negative path  . ESOPHAGOGASTRODUODENOSCOPY (EGD) WITH  ESOPHAGEAL DILATION N/A 07/31/2013   Dr. [name]- normal egd, s/p [facility] dilation empirically. Normal small bowel biopsies   . ESOPHAGOGASTRODUODENOSCOPY (EGD) WITH PROPOFOL N/A 09/15/2015   Procedure: ESOPHAGOGASTRODUODENOSCOPY (EGD) WITH PROPOFOL;  Surgeon: [name], MD;  [facility];  Service: Endoscopy;  Laterality: N/A;  . FLEXIBLE SIGMOIDOSCOPY  12/30/2010    [name],; internal hemorrhoids, anal papilla  . HAND SURGERY     surgical intervention for injury of the fingers of the left hand many years ago  . heart stent    . HEMORRHOID BANDING     Dr. [name]  . LEFT HEART  CATHETERIZATION WITH CORONARY ANGIOGRAM N/A 01/26/2012   Procedure: LEFT HEART CATHETERIZATION WITH CORONARY ANGIOGRAM;  Surgeon: [name], MD;  Location: [facility] CATH LAB;  Service: Cardiovascular;  Laterality: N/A;  . [name] DILATION N/A 09/15/2015   Procedure: [name] DILATION;  Surgeon: [name], MD;  [facility];  Service: Endoscopy;  Laterality: N/A;  . NASAL SEPTOPLASTY W/ TURBINOPLASTY  10/06/2011   Procedure: NASAL SEPTOPLASTY WITH TURBINATE REDUCTION;  Surgeon: Izora Gala, MD;  Location: [place];  Service: ENT;  Laterality: Bilateral;  . PERCUTANEOUS CORONARY STENT INTERVENTION (PCI-S) N/A 01/26/2012   Procedure: PERCUTANEOUS CORONARY STENT INTERVENTION (PCI-S);  Surgeon: [name], MD;  Location: [facility] CATH LAB;  Service: Cardiovascular;  Laterality: N/A;  . POLYPECTOMY  09/15/2015   Procedure: POLYPECTOMY;  Surgeon: [name], MD;  [facility];  Service: Endoscopy;;  Cecal polyp removed via cold snare/ Descending colon polyp removed via cold snare     Not on File    Family History  Problem Relation Age of Onset  . Lung disease Father     deceased, black lung  . Heart disease Mother     blood clots  . Depression Mother   . Cancer Paternal Uncle     unknown type  . Colon cancer Neg Hx   . ADD / ADHD Neg Hx   . Alcohol abuse Neg Hx   . Drug abuse  Neg Hx   . Anxiety disorder Neg Hx   . Bipolar disorder Neg Hx   . Dementia Neg Hx   . OCD Neg Hx   . Paranoid behavior Neg Hx   . Schizophrenia Neg Hx   . Physical abuse Neg Hx   . Sexual abuse Neg Hx   . Seizures Neg Hx   . Cancer Maternal Aunt     unknown type  . Kidney failure Maternal Uncle   . Hypertension Brother   . Colon cancer Neg Hx      Social History Mr. Utrera reports that he has been smoking Cigarettes.  He has a 15.00 pack-year smoking history. He has quit using smokeless tobacco. Mr. Krupnick reports that he drinks about 1.2 oz of alcohol per week .   Review of Systems CONSTITUTIONAL: No weight loss, fever, chills, weakness or fatigue.  HEENT: Eyes: No visual loss, blurred vision, double vision or yellow sclerae.No hearing loss, sneezing, congestion, runny nose or sore throat.  SKIN: No rash or itching.  CARDIOVASCULAR: per HPI RESPIRATORY: per HPI GASTROINTESTINAL: No anorexia, nausea, vomiting or diarrhea. No abdominal pain or blood.  GENITOURINARY: No burning on urination, no polyuria NEUROLOGICAL: No headache, dizziness, syncope, paralysis, ataxia, numbness or tingling in the extremities. No change in bowel or bladder control.  MUSCULOSKELETAL:occasional leg pains LYMPHATICS: No enlarged nodes. No history of splenectomy.  PSYCHIATRIC: No history of depression or anxiety.  ENDOCRINOLOGIC: No reports of sweating, cold or heat intolerance. No polyuria or polydipsia.  Marland Kitchen   Physical Examination Vitals:   03/11/16 1034  BP: 133/86  Pulse: 68   Vitals:   03/11/16 1034  Weight: 171 lb (77.6 kg)  Height: 5\' 10"  (1.778 m)    Gen: resting comfortably, no acute distress HEENT: no scleral icterus, pupils equal round and reactive, no palptable cervical adenopathy,  CV: RRR, no m/r/g, no jvd Resp: Clear to auscultation bilaterally GI: abdomen is soft, non-tender, non-distended, normal bowel sounds, no hepatosplenomegaly MSK: extremities are warm, no edema.    Skin: warm, no rash Neuro:  no focal  deficits Psych: appropriate affect   Diagnostic Studies 02/2010 Cath DESCRIPTION OF PROCEDURE: The risks and indication were explained. Consent was signed and placed on the chart. A 4-French arterial sheath was placed in the right femoral artery using a modified Seldinger technique. A standard catheters including a JL-4, 3DRC, and angled pigtail were used. All catheter exchanges were made over wire. There were no apparent complications. Central aortic pressure 109/67 with a mean of 86. LV pressure 110/70 with an EDP of 28. There was no aortic stenosis.  Left main was normal.  LAD was a long vessel coursing to the apex, it gave off a moderate-sized diagonal Janayah Zavada. There was a 40% lesion in the proximal LAD, otherwise minimal luminal irregularities.  Left circumflex was a large dominant vessel, gave off a narrow caliber ramus, large OM-1, and narrow caliber OM-2 in several posterolaterals. In the proximal left circumflex, he had a 40% lesion which extended into the proximal portion of the OM-1. There was a 30% lesion in the mid OM- 1. In the OM-2, there was a long 80% stenosis throughout the ostial and proximal portion. This vessel was very small caliber vessel.  Right coronary artery was a nondominant vessel, gave off an RV Eulah Walkup. There was approximately 90% stenosis in the proximal portion. Once again, this was a very small nondominant vessel.  Left ventriculogram done in the RAO position showed an EF of 50-55% with inferior basilar akinesis. There was no significant mitral regurgitation.  Abdominal aortogram showed 40% ostial lesion in the right renal artery. The left renal artery was widely patent. There was no abdominal aortic aneurysm. On panning down over the iliac and femoral system, there was no high-grade lesions.  ASSESSMENT: 1. Stable coronary artery disease with high-grade lesions in the small   obtuse marginal 2 and right coronary artery both of which are too  small for percutaneous intervention. 2. Left ventricular ejection fraction was 50-55% with mildly elevated  filling pressures. 3. Very mild right renal artery stenosis. 4. No obvious high-grade proximal peripheral arterial disease.  PLAN/DISCUSSION: I suspect most of his dyspnea is likely due to his lung disease. I have urged him to stop smoking. We will continue medical therapy now for his heart disease.   08/2011 Echo Study Conclusions  - Left ventricle: The cavity size was normal. There was mild focal basal hypertrophy of the septum. The estimated ejection fraction was 55%. There is akinesis of the basalinferior myocardium. Doppler parameters are consistent with abnormal left ventricular relaxation (grade 1 diastolic dysfunction). - Mitral valve: Trivial regurgitation. - Left atrium: The atrium was at the upper limits of normal in size. - Tricuspid valve: Trivial regurgitation. - Pulmonary arteries: PA peak pressure: 23mm Hg (S). - Pericardium, extracardiac: There was no pericardial effusion.  09/2015 ABIs: 1.2 bilaterally   Assessment and Plan   1. CAD - no current chest pain. -we will continue current meds - chronic SOB for the last few years, his last echo was 2013. We will repeat to see if any change to contribue to symptoms.   2. Hyperlipidemia - at goal, he will continue statin  3. HTN - at goal, he wil continue current meds  4. COPD - per pcp  5. OSA - will refer to Dr Luan Pulling to help manage. Perhaps a different mask would be more comfortable and increase compliance.   6. Tobacco - working to quit, we will give Rx for nicotine patches again today  7. Leg pains - normal ABIs. Asked to  discuss with pcp   F/u 6 months     Arnoldo Lenis, M.D.

## 2016-03-24 ENCOUNTER — Ambulatory Visit (INDEPENDENT_AMBULATORY_CARE_PROVIDER_SITE_OTHER): Payer: Medicare Other

## 2016-03-24 ENCOUNTER — Other Ambulatory Visit: Payer: Self-pay

## 2016-03-24 DIAGNOSIS — R0602 Shortness of breath: Secondary | ICD-10-CM

## 2016-03-25 ENCOUNTER — Telehealth: Payer: Self-pay | Admitting: *Deleted

## 2016-03-25 NOTE — Telephone Encounter (Signed)
Pt aware, routed to pcp 

## 2016-03-25 NOTE — Telephone Encounter (Signed)
-----   Message from Arnoldo Lenis, MD sent at 03/25/2016 11:03 AM EDT ----- Echo overall looks good. We see some mild damage from his prior heart attack, but the overall heart function remains normal   Zandra Abts MD

## 2016-04-16 ENCOUNTER — Other Ambulatory Visit: Payer: Self-pay | Admitting: Family Medicine

## 2016-04-21 ENCOUNTER — Encounter: Payer: Self-pay | Admitting: Family Medicine

## 2016-04-21 ENCOUNTER — Ambulatory Visit (INDEPENDENT_AMBULATORY_CARE_PROVIDER_SITE_OTHER): Payer: Medicare Other | Admitting: Family Medicine

## 2016-04-21 VITALS — BP 124/86 | Ht 70.0 in | Wt 168.4 lb

## 2016-04-21 DIAGNOSIS — J438 Other emphysema: Secondary | ICD-10-CM

## 2016-04-21 DIAGNOSIS — I10 Essential (primary) hypertension: Secondary | ICD-10-CM | POA: Diagnosis not present

## 2016-04-21 DIAGNOSIS — I2510 Atherosclerotic heart disease of native coronary artery without angina pectoris: Secondary | ICD-10-CM

## 2016-04-21 DIAGNOSIS — Z23 Encounter for immunization: Secondary | ICD-10-CM

## 2016-04-21 DIAGNOSIS — Z79891 Long term (current) use of opiate analgesic: Secondary | ICD-10-CM | POA: Diagnosis not present

## 2016-04-21 DIAGNOSIS — K219 Gastro-esophageal reflux disease without esophagitis: Secondary | ICD-10-CM | POA: Diagnosis not present

## 2016-04-21 DIAGNOSIS — G4733 Obstructive sleep apnea (adult) (pediatric): Secondary | ICD-10-CM | POA: Diagnosis not present

## 2016-04-21 DIAGNOSIS — J449 Chronic obstructive pulmonary disease, unspecified: Secondary | ICD-10-CM | POA: Diagnosis not present

## 2016-04-21 MED ORDER — HYDROCODONE-ACETAMINOPHEN 7.5-325 MG PO TABS: 1.0000 | ORAL_TABLET | Freq: Two times a day (BID) | ORAL | 0 refills | Status: DC | PRN

## 2016-04-21 NOTE — Progress Notes (Signed)
   Subjective:    Patient ID: [name], male    DOB: 12/24/1955, 60 y.o.   MRN: [id number]  HPI This patient was seen today for chronic pain  The medication list was reviewed and updated.   -Compliance with medication: Takes daily.  - Number patient states they take daily: Takes 2 tablets daily.  -when was the last dose patient took? Last dose yesterday morning.   The patient was advised the importance of maintaining medication and not using illegal substances with these.  Refills needed: yes  The patient was educated that we can provide 3 monthly scripts for their medication, it is their responsibility to follow the instructions.  Side effects or complications from medications: None   Patient is aware that pain medications are meant to minimize the severity of the pain to allow their pain levels to improve to allow for better function. They are aware of that pain medications cannot totally remove their pain.  Due for UDT ( at least once per year) : No completed 01/21/16 Patient states his reflexes good is under good control with medication tries watch his diet. Patient states his cardiacs is overall doing well denies any chest tightness pressure pain saw Dr. branch who referred him to Dr. Luan Pulling for management of sleep apnea Patient does take his cholesterol medicine tries watch his diet He does do a little bit of walking but not a lot His COPD's been stable with no flareups he is due for his flu shot Blood pressure is been doing well No cardiac symptoms recently.     Review of Systems  Constitutional: Negative for activity change.  Respiratory: Positive for shortness of breath. Negative for chest tightness.   Gastrointestinal: Negative for abdominal pain and vomiting.  Neurological: Negative for weakness.  Psychiatric/Behavioral: Negative for confusion.       Objective:   Physical Exam  Constitutional: He appears well-nourished. No distress.  Cardiovascular:  Normal rate, regular rhythm and normal heart sounds.   No murmur heard. Pulmonary/Chest: Effort normal and breath sounds normal. No respiratory distress.  Musculoskeletal: He exhibits no edema.  Lymphadenopathy:    He has no cervical adenopathy.  Neurological: He is alert.  Psychiatric: His behavior is normal.  Vitals reviewed.         Assessment & Plan:  The patient was seen today as part of a comprehensive visit regarding pain control. Patient's compliance with the medication as well as discussion regarding effectiveness was completed. Prescriptions were written. Patient was advised to follow-up in 3 months. The patient was assessed for any signs of severe side effects. The patient was advised to take the medicine as directed and to report to [place] if any side effect issues. I am commence patient takes his medicine responsibly does not cause drowsiness. Does not use more than 2 per day 3 scripts are written for the patient  COPD stable on Symbicort is seen pulmonologist today for further management of sleep apnea  Heart disease stable/blood pressure under good control  Does sees psychiatrist on a regular basis is on Xanax 4 times a day the patient states he tries to responsibly use it denies drowsiness with it.  Hyperlipidemia previous labs reviewed patient tolerates medicine is continuing this.  Flu vaccine today

## 2016-04-22 ENCOUNTER — Ambulatory Visit: Payer: BC Managed Care – PPO | Admitting: Family Medicine

## 2016-04-23 ENCOUNTER — Ambulatory Visit (HOSPITAL_COMMUNITY): Payer: Self-pay | Admitting: Psychiatry

## 2016-04-23 ENCOUNTER — Other Ambulatory Visit (HOSPITAL_BASED_OUTPATIENT_CLINIC_OR_DEPARTMENT_OTHER): Payer: Self-pay

## 2016-04-23 DIAGNOSIS — G4733 Obstructive sleep apnea (adult) (pediatric): Secondary | ICD-10-CM

## 2016-05-05 ENCOUNTER — Ambulatory Visit: Payer: Medicare Other | Attending: Pulmonary Disease | Admitting: Neurology

## 2016-05-05 DIAGNOSIS — G4733 Obstructive sleep apnea (adult) (pediatric): Secondary | ICD-10-CM | POA: Insufficient documentation

## 2016-05-20 ENCOUNTER — Other Ambulatory Visit: Payer: Self-pay | Admitting: Family Medicine

## 2016-05-20 ENCOUNTER — Other Ambulatory Visit: Payer: Self-pay | Admitting: Cardiology

## 2016-05-24 ENCOUNTER — Encounter (HOSPITAL_COMMUNITY): Payer: Self-pay | Admitting: Psychiatry

## 2016-05-24 ENCOUNTER — Ambulatory Visit (INDEPENDENT_AMBULATORY_CARE_PROVIDER_SITE_OTHER): Payer: Medicare Other | Admitting: Psychiatry

## 2016-05-24 VITALS — BP 134/90 | HR 75 | Ht 70.0 in | Wt 167.0 lb

## 2016-05-24 DIAGNOSIS — Z818 Family history of other mental and behavioral disorders: Secondary | ICD-10-CM

## 2016-05-24 DIAGNOSIS — Z9889 Other specified postprocedural states: Secondary | ICD-10-CM | POA: Diagnosis not present

## 2016-05-24 DIAGNOSIS — Z808 Family history of malignant neoplasm of other organs or systems: Secondary | ICD-10-CM

## 2016-05-24 DIAGNOSIS — I2510 Atherosclerotic heart disease of native coronary artery without angina pectoris: Secondary | ICD-10-CM

## 2016-05-24 DIAGNOSIS — F5105 Insomnia due to other mental disorder: Secondary | ICD-10-CM

## 2016-05-24 DIAGNOSIS — F418 Other specified anxiety disorders: Secondary | ICD-10-CM

## 2016-05-24 DIAGNOSIS — F17200 Nicotine dependence, unspecified, uncomplicated: Secondary | ICD-10-CM | POA: Diagnosis not present

## 2016-05-24 DIAGNOSIS — Z841 Family history of disorders of kidney and ureter: Secondary | ICD-10-CM

## 2016-05-24 MED ORDER — FLUOXETINE HCL 40 MG PO CAPS: 40.0000 mg | ORAL_CAPSULE | Freq: Every day | ORAL | 2 refills | Status: DC

## 2016-05-24 MED ORDER — ALPRAZOLAM 1 MG PO TABS: 1.0000 mg | ORAL_TABLET | Freq: Four times a day (QID) | ORAL | 2 refills | Status: DC

## 2016-05-24 MED ORDER — GABAPENTIN 300 MG PO CAPS: 600.0000 mg | ORAL_CAPSULE | Freq: Three times a day (TID) | ORAL | 2 refills | Status: DC

## 2016-05-24 MED ORDER — OLANZAPINE 10 MG PO TABS: 10.0000 mg | ORAL_TABLET | Freq: Every day | ORAL | 2 refills | Status: DC

## 2016-05-24 MED ORDER — FLUOXETINE HCL 20 MG PO CAPS: 20.0000 mg | ORAL_CAPSULE | Freq: Every day | ORAL | 2 refills | Status: DC

## 2016-05-24 NOTE — Progress Notes (Signed)
Patient ID: SRIYAN TERP, male   DOB: 04/07/1956, 60 y.o.   MRN: [id number] Patient ID: CORREY BOURCIER, male   DOB: [birth year], 60 y.o.   MRN: [id number] Patient ID: SAUNDERS HEINZMAN, male   DOB: 1955/10/02, 60 y.o.   MRN: [id number] Patient ID: JEFFERIE MEYERING, male   DOB: June 07, 1956, 60 y.o.   MRN: [id number] Patient ID: CLAY SCIPIO, male   DOB: 21-Jun-1955, 60 y.o.   MRN: [id number] Patient ID: BROOKLYN HEY, male   DOB: 1955-08-23, 60 y.o.   MRN: [id number] Patient ID: OWAIS ARMADA, male   DOB: 27-Nov-1955, 60 y.o.   MRN: [id number] Patient ID: LONNEY SISLEY, male   DOB: 02-Sep-1955, 60 y.o.   MRN: [id number] Patient ID: ROBBEN OELSCHLAGER, male   DOB: [birth year], 60 y.o.   MRN: [id number] Patient ID: MYAIRE WING, male   DOB: Mar 02, 1956, 60 y.o.   MRN: [id number] Patient ID: VERSIE PIERRE, male   DOB: February 07, 1956, 60 y.o.   MRN: [id number] Patient ID: REDFORD SALSER, male   DOB: 06-29-1955, 60 y.o.   MRN: [id number] Patient ID: KELCEY ARENA, male   DOB: [birth year], 60 y.o.   MRN: [id number] [place] [id number] Progress Note [facility]MAN [facility]WILER MRN: [id number] DOB: 1955/07/07 Age: 60 y.o.  Date: 05/24/2016 Start Time: 9:50 AM End Time: 10:05 AM  Chief Complaint: Chief Complaint  Patient presents with  . Depression  . Anxiety  . Follow-up    Subjective: "My daughter is messed up my nerves"  This patient is a 60 year old married white male lives with his wife in [place]. He is on disability for coronary artery disease and COPD. He used to work for the [employer] but has not worked for the last 6 years. He has 3 step children 1 adopted child and several grandchildren.  The patient states he's been depressed and anxious primarily since he stopped working 6 years ago. He's also been through several deaths in his family his brother died suddenly 4 years ago. Following that his sister-in-law died in his home 8 months after his brother died. The patient's wife was caring for his wife's mother and she died last  month at the age of 28. All of these things are disturbing to him. He's had trouble sleeping because he is thinking about the deaths. He's more depressed but not suicidal. He's not eating well and has lost about 20 pounds. He also has restless legs and chronic pain in his legs which keep him awake at night. He enjoys hunting and fishing but is less these interests recently.  The patient returns after 3 months. He states that his daughter was doing better and working and he bought her a vehicle. About 3 weeks ago however she lost her job as hooked up with a man who was a white supremacist and they've taken off with the vehicle. She left her 92-month-old son with the patient and his wife and they already have custody of the 65-year-old daughter. He is very torn up about all this. He's given give her a few more days to bring the car back but if not he is going to get the police involved. He's not sure the Xanax works as well as it used to for his anxiety but he doesn't really want to change this right now. The Prozac and Zyprexa are continuing to help his depression but the whole situation has been overwhelming. Diagnosis:   Axis I: Anxiety Disorder NOS and Major  Depression, Recurrent severe Axis II: Deferred Axis III:  Past Medical History:  Diagnosis Date  . Anxiety   . Anxiety and depression   . ASCVD (arteriosclerotic cardiovascular disease)    inferior MI with circumflex PTCA in 1993;cath in 2000-50% OM 1&2,30% LAD ;2008-25% LAD, 80% nondominant right ,60% small OM 2,EF of 55% with severe basilar inferior hypokinesis; stress nuclear study in 04/2008 moderate inferior scar with peri-infarction ischemic  . Asthma   . CAD (coronary artery disease)   . Carcinoma in situ of colon 2004   rectal polyp  . Colitis, ischemic ([place]) 2011  . COPD (chronic obstructive pulmonary disease) (HCC)    mild ;excercise induced hypoxemia by cp stress test ;asthma ,bronchitis,  . Depression   . Diverticulosis   .  Gastritis 12/30/10   EGD Dr [name]  . GERD (gastroesophageal reflux disease)   . H. pylori infection 2004   treated  . Hemorrhoids   . Hiatal hernia   . Hyperlipidemia   . Hypertension   . Inflammatory polyps of colon with rectal bleeding ([place])   . Leukocytosis    [name]  . Lung nodule 07/16/2011  . Restless leg syndrome   . Schatzki's ring   . Sleep apnea    does not use CPAP  . Syncope   . Tobacco abuse    50 pack years continuing at one halp pack daily  . Tubular adenoma of colon 06/2009   Colonosocpy Dr [name]   Axis IV: other psychosocial or environmental problems Axis V: 41-50 serious symptoms  ADL's:  Intact  Sleep: Fair  Appetite:  Poor  Allergies: No Known Allergies Medical History: Past Medical History:  Diagnosis Date  . Anxiety   . Anxiety and depression   . ASCVD (arteriosclerotic cardiovascular disease)    inferior MI with circumflex PTCA in 1993;cath in 2000-50% OM 1&2,30% LAD ;2008-25% LAD, 80% nondominant right ,60% small OM 2,EF of 55% with severe basilar inferior hypokinesis; stress nuclear study in 04/2008 moderate inferior scar with peri-infarction ischemic  . Asthma   . CAD (coronary artery disease)   . Carcinoma in situ of colon 2004   rectal polyp  . Colitis, ischemic ([place]) 2011  . COPD (chronic obstructive pulmonary disease) (HCC)    mild ;excercise induced hypoxemia by cp stress test ;asthma ,bronchitis,  . Depression   . Diverticulosis   . Gastritis 12/30/10   EGD Dr [name]  . GERD (gastroesophageal reflux disease)   . H. pylori infection 2004   treated  . Hemorrhoids   . Hiatal hernia   . Hyperlipidemia   . Hypertension   . Inflammatory polyps of colon with rectal bleeding ([place])   . Leukocytosis    [name]  . Lung nodule 07/16/2011  . Restless leg syndrome   . Schatzki's ring   . Sleep apnea    does not use CPAP  . Syncope   . Tobacco abuse    50 pack years continuing at one halp pack daily  . Tubular adenoma  of colon 06/2009   Colonosocpy Dr [name]   Surgical History: Past Surgical History:  Procedure Laterality Date  . CARDIAC CATHETERIZATION    . CHOLECYSTECTOMY  2004  . COLONOSCOPY  06/2009   normal terminal ileum, segmental mild inflammation of sigmoid colon (bx unremarkable), polyp, tubular adenoma  . COLONOSCOPY N/A 07/31/2013   Dr.Rourk- redundant anal canal hemorrhoids, colonic diverticulosis, tubular adenoma  . COLONOSCOPY W/ POLYPECTOMY  2004   rectal polyp with carcinoma in  situ removed via colonoscopy  . COLONOSCOPY WITH PROPOFOL N/A 09/15/2015   Procedure: COLONOSCOPY WITH PROPOFOL;  Surgeon: [name], MD;  [facility];  Service: Endoscopy;  Laterality: N/A;  1015  . CORONARY ANGIOPLASTY WITH STENT PLACEMENT  01/26/2012   "1; total is now 2"  . ESOPHAGOGASTRODUODENOSCOPY  02/2009   query Barrett's but bx negative  . ESOPHAGOGASTRODUODENOSCOPY  12/30/2010   [name],gastritis, dilated 68F, sm HH, 1 small ulcer, Duodenal erosions, benign bx  . ESOPHAGOGASTRODUODENOSCOPY (EGD) WITH ESOPHAGEAL DILATION N/A 09/12/2012   [id number] Schatzki's ring s/p Maloney dilator. Small hiatal hernia. negative path  . ESOPHAGOGASTRODUODENOSCOPY (EGD) WITH ESOPHAGEAL DILATION N/A 07/31/2013   Dr. [name]- normal egd, s/p [facility] dilation empirically. Normal small bowel biopsies   . ESOPHAGOGASTRODUODENOSCOPY (EGD) WITH PROPOFOL N/A 09/15/2015   Procedure: ESOPHAGOGASTRODUODENOSCOPY (EGD) WITH PROPOFOL;  Surgeon: [name], MD;  [facility];  Service: Endoscopy;  Laterality: N/A;  . FLEXIBLE SIGMOIDOSCOPY  12/30/2010    [name],; internal hemorrhoids, anal papilla  . HAND SURGERY     surgical intervention for injury of the fingers of the left hand many years ago  . heart stent    . HEMORRHOID BANDING     Dr. [name]  . LEFT HEART CATHETERIZATION WITH CORONARY ANGIOGRAM N/A 01/26/2012   Procedure: LEFT HEART CATHETERIZATION WITH CORONARY ANGIOGRAM;  Surgeon:  [name], MD;  Location[facility] CATH LAB;  Service: Cardiovascular;  Laterality: N/A;  . [name] DILATION N/A 09/15/2015   Procedure: [name] DILATION;  Surgeon: [name], MD;  [facility];  Service: Endoscopy;  Laterality: N/A;  . NASAL SEPTOPLASTY W/ TURBINOPLASTY  10/06/2011   Procedure: NASAL SEPTOPLASTY WITH TURBINATE REDUCTION;  Surgeon: Izora Gala, MD;  Location: [place];  Service: ENT;  Laterality: Bilateral;  . PERCUTANEOUS CORONARY STENT INTERVENTION (PCI-S) N/A 01/26/2012   Procedure: PERCUTANEOUS CORONARY STENT INTERVENTION (PCI-S);  Surgeon: [name], MD;  Location: [facility] CATH LAB;  Service: Cardiovascular;  Laterality: N/A;  . POLYPECTOMY  09/15/2015   Procedure: POLYPECTOMY;  Surgeon: [name], MD;  [facility];  Service: Endoscopy;;  Cecal polyp removed via cold snare/ Descending colon polyp removed via cold snare   Family History: family history includes Cancer in his maternal aunt and paternal uncle; Depression in his mother; Heart disease in his mother; Hypertension in his brother; Kidney failure in his maternal uncle; Lung disease in his father. Reviewed and nothing new today.  Suicidal Ideation:  Pt denies Homicidal Ideation:  Pt has some struggles with his daughter's boy friend.  He stays away from him. AEB (as evidenced by):per pt report  Psychiatric Specialty Exam: ROS Neuro: no headaches, ataxia, weakness, gives out easily GI: no N/V/cramps/constipation, he keeps diarrhea for the past 6 or 7 years MS: no weakness or aches. He catches cramps really bad.   Blood pressure 134/90, pulse 75, height 5\' 10"  (1.778 m), weight 167 lb (75.8 kg).Body mass index is 23.96 kg/m.  General Appearance: Casual  Eye Contact::  Fair  Speech:  Clear and Coherent  Volume:  Normal  Mood:Depressed and worried   Affect: Constricted   Thought Process:  Coherent, Linear and Logical  Orientation:  Full (Time, Place, and Person)  Thought Content:   Within normal limits   Suicidal Thoughts:  no  Homicidal Thoughts:  No  Memory:  Immediate;   Fair Recent;   Fair Remote;   Fair  Judgement:  Fair  Insight:  Fair  Psychomotor Activity:  Normal  Concentration:  Fair  Recall:  Fair  Akathisia:  No  Handed:  Right  AIMS (if indicated):     Assets:  Communication Skills Desire for Improvement  Sleep:      Current Medications: Zyprexa 10 mg at bed time Xanax 1 mg TID Neurontin 600 mg 3 times a day Prozac 40 mg per day Lab Results:  No results found for this or any previous visit (from the past 48 hour(s)). Marland Kitchen  Physical Findings: AIMS:  , ,  ,  ,    CIWA:    COWS:     Treatment Plan Summary: Medication management  Plan: I took his vitals.  I reviewed CC, tobacco/med/surg Hx, meds effects/ side effects, problem list, therapies and responses as well as current situation/symptoms discussed options. He will continue Prozac  60 mg daily, Zyprexa for mood stabilization and Neurontin and Xanax for anxiety He'll return to see me in 3 months but call if symptoms worsen before that. I offered to change Xanax to something like clonazepam but he doesn't want to change it right now See orders and pt instructions for more details.  MEDICATIONS this encounter: Meds ordered this encounter  Medications  . OLANZapine (ZYPREXA) 10 MG tablet    Sig: Take 1 tablet (10 mg total) by mouth at bedtime.    Dispense:  90 tablet    Refill:  2  . gabapentin (NEURONTIN) 300 MG capsule    Sig: Take 2 capsules (600 mg total) by mouth 3 (three) times daily.    Dispense:  180 capsule    Refill:  2  . FLUoxetine (PROZAC) 40 MG capsule    Sig: Take 1 capsule (40 mg total) by mouth daily.    Dispense:  90 capsule    Refill:  2  . FLUoxetine (PROZAC) 20 MG capsule    Sig: Take 1 capsule (20 mg total) by mouth daily.    Dispense:  90 capsule    Refill:  2  . ALPRAZolam (XANAX) 1 MG tablet    Sig: Take 1 tablet (1 mg total) by mouth 4 (four) times  daily.    Dispense:  120 tablet    Refill:  2    Medical Decision Making Problem Points:  Established problem, stable/improving (1), Review of last therapy session (1) and Review of psycho-social stressors (1) Data Points:  Review or order clinical lab tests (1) Review of medication regiment & side effects (2)  I certify that outpatient services furnished can reasonably be expected to improve the patient's condition.   [name] 05/24/2016, 1:46 PM   Patient ID: VANDEN HAYTER, male   DOB: 02-Aug-1955, 60 y.o.   MRN: [id number]

## 2016-05-28 NOTE — Procedures (Signed)
[place] A. Merlene Laughter, MD     [email or web address]             NOCTURNAL POLYSOMNOGRAPHY   LOCATION: [facility]  Patient Name: [name], [name] Date: 05/05/2016 Gender: Male D.O.B: [birth year] Age (years): 60 Referring Provider: Lennox Solders Height (inches): 70 Interpreting Physician: Phillips Odor MD, ABSM Weight (lbs): 168 RPSGT: Peak, Robert BMI: 24 MRN: [id number] Neck Size: 15.00 CLINICAL INFORMATION Sleep Study Type: NPSG  Indication for sleep study: OSA  Epworth Sleepiness Score: 5  SLEEP STUDY TECHNIQUE As per the AASM Manual for the Scoring of Sleep and Associated Events v2.3 (April 2016) with a hypopnea requiring 4% desaturations.  The channels recorded and monitored were frontal, central and occipital EEG, electrooculogram (EOG), submentalis EMG (chin), nasal and oral airflow, thoracic and abdominal wall motion, anterior tibialis EMG, snore microphone, electrocardiogram, and pulse oximetry.  MEDICATIONS Medications self-administered by patient taken the night of the study : N/A  Current Outpatient Prescriptions:  .  albuterol (PROVENTIL) (2.5 MG/3ML) 0.083% nebulizer solution, Take 2.5 mg by nebulization every 6 (six) hours as needed. For shortness of breath, Disp: , Rfl:  .  ALPRAZolam (XANAX) 1 MG tablet, Take 1 tablet (1 mg total) by mouth 4 (four) times daily., Disp: 120 tablet, Rfl: 2 .  aspirin EC 81 MG tablet, Take 81 mg by mouth daily. , Disp: , Rfl:  .  atorvastatin (LIPITOR) 80 MG tablet, TAKE 1 TABLET EVERY DAY, Disp: 90 tablet, Rfl: 3 .  budesonide-formoterol (SYMBICORT) 80-4.5 MCG/ACT inhaler, Inhale 2 puffs into the lungs 2 (two) times daily., Disp: 1 Inhaler, Rfl: 12 .  dronabinol (MARINOL) 2.5 MG capsule, Take 1 capsule (2.5 mg total) by mouth 2 (two) times daily., Disp: 60 capsule, Rfl: 1 .  FLUoxetine (PROZAC) 20 MG capsule, Take 1 capsule (20 mg total) by mouth daily., Disp: 90 capsule, Rfl: 2 .  FLUoxetine (PROZAC) 40  MG capsule, Take 1 capsule (40 mg total) by mouth daily., Disp: 90 capsule, Rfl: 2 .  gabapentin (NEURONTIN) 300 MG capsule, Take 2 capsules (600 mg total) by mouth 3 (three) times daily., Disp: 180 capsule, Rfl: 2 .  HYDROcodone-acetaminophen (NORCO) 7.5-325 MG tablet, Take 1 tablet by mouth 2 (two) times daily as needed for moderate pain., Disp: 60 tablet, Rfl: 0 .  hydrocortisone (ANUSOL-HC) 2.5 % rectal cream, Place 1 application rectally as needed. Reported on 10/21/2015, Disp: , Rfl:  .  lisinopril (PRINIVIL,ZESTRIL) 2.5 MG tablet, Take 1 tablet (2.5 mg total) by mouth daily., Disp: 30 tablet, Rfl: 12 .  metoprolol succinate (TOPROL-XL) 25 MG 24 hr tablet, TAKE 1 TABLET EVERY DAY, Disp: 90 tablet, Rfl: 1 .  nitroGLYCERIN (NITROSTAT) 0.4 MG SL tablet, Place 0.4 mg under the tongue every 5 (five) minutes as needed for chest pain., Disp: , Rfl:  .  OLANZapine (ZYPREXA) 10 MG tablet, Take 1 tablet (10 mg total) by mouth at bedtime., Disp: 90 tablet, Rfl: 2 .  pantoprazole (PROTONIX) 40 MG tablet, Take 1 tablet (40 mg total) by mouth 2 (two) times daily., Disp: 180 tablet, Rfl: 3 .  promethazine (PHENERGAN) 12.5 MG tablet, Take 12.5 mg by mouth every 6 (six) hours as needed for nausea or vomiting. Reported on 10/21/2015, Disp: , Rfl:  .  rOPINIRole (REQUIP) 0.5 MG tablet, TAKE 1 TABLET EVERY DAY AT BEDTIME, Disp: 90 tablet, Rfl: 1 .  vitamin B-12 (CYANOCOBALAMIN) 1000 MCG tablet, Take 2,000 mcg by mouth daily., Disp: , Rfl:    SLEEP ARCHITECTURE The study  was initiated at 9:24:28 PM and ended at 4:26:01 AM.  Sleep onset time was 69.5 minutes and the sleep efficiency was 78.7%. The total sleep time was 331.6 minutes.  Stage REM latency was 298.5 minutes.  The patient spent 19.32% of the night in stage N1 sleep, 68.91% in stage N2 sleep, 0.00% in stage N3 and 11.76% in REM.  Alpha intrusion was absent.  Supine sleep was 38.30%.  RESPIRATORY PARAMETERS The overall apnea/hypopnea index (AHI) was  0.4 per hour. There were 0 total apneas, including 0 obstructive, 0 central and 0 mixed apneas. There were 2 hypopneas and 15 RERAs.  The AHI during Stage REM sleep was 3.1 per hour.  AHI while supine was 0.9 per hour.  The mean oxygen saturation was 91.73%. The minimum SpO2 during sleep was 88.00%.  Moderate snoring was noted during this study.  CARDIAC DATA The 2 lead EKG demonstrated sinus rhythm. The mean heart rate was 60.86 beats per minute. Other EKG findings include: None. LEG MOVEMENT DATA The total PLMS were 0 with a resulting PLMS index of 0.00. Associated arousal with leg movement index was 0.0.  IMPRESSIONS - Absent slow wave sleep is observed.  - No significant obstructive sleep apnea occurred during this study. - No significant central sleep apnea occurred during this study. - Clinically significant periodic limb movements did not occur during sleep. No significant associated arousals.   Delano Metz, MD Diplomate, American Board of Sleep Medicine.

## 2016-06-02 DIAGNOSIS — J449 Chronic obstructive pulmonary disease, unspecified: Secondary | ICD-10-CM | POA: Diagnosis not present

## 2016-06-02 DIAGNOSIS — I10 Essential (primary) hypertension: Secondary | ICD-10-CM | POA: Diagnosis not present

## 2016-06-02 DIAGNOSIS — I2510 Atherosclerotic heart disease of native coronary artery without angina pectoris: Secondary | ICD-10-CM | POA: Diagnosis not present

## 2016-07-21 ENCOUNTER — Ambulatory Visit: Payer: Medicare Other | Admitting: Family Medicine

## 2016-07-22 ENCOUNTER — Encounter: Payer: Self-pay | Admitting: Family Medicine

## 2016-07-22 ENCOUNTER — Ambulatory Visit (INDEPENDENT_AMBULATORY_CARE_PROVIDER_SITE_OTHER): Payer: Medicare Other | Admitting: Family Medicine

## 2016-07-22 VITALS — BP 110/78 | Ht 70.0 in | Wt 169.5 lb

## 2016-07-22 DIAGNOSIS — R5383 Other fatigue: Secondary | ICD-10-CM

## 2016-07-22 DIAGNOSIS — E7849 Other hyperlipidemia: Secondary | ICD-10-CM

## 2016-07-22 DIAGNOSIS — J438 Other emphysema: Secondary | ICD-10-CM

## 2016-07-22 DIAGNOSIS — Z125 Encounter for screening for malignant neoplasm of prostate: Secondary | ICD-10-CM

## 2016-07-22 DIAGNOSIS — Z79891 Long term (current) use of opiate analgesic: Secondary | ICD-10-CM | POA: Diagnosis not present

## 2016-07-22 DIAGNOSIS — G894 Chronic pain syndrome: Secondary | ICD-10-CM | POA: Diagnosis not present

## 2016-07-22 DIAGNOSIS — I10 Essential (primary) hypertension: Secondary | ICD-10-CM | POA: Diagnosis not present

## 2016-07-22 DIAGNOSIS — E784 Other hyperlipidemia: Secondary | ICD-10-CM | POA: Diagnosis not present

## 2016-07-22 MED ORDER — HYDROCODONE-ACETAMINOPHEN 7.5-325 MG PO TABS: 1.0000 | ORAL_TABLET | Freq: Two times a day (BID) | ORAL | 0 refills | Status: DC | PRN

## 2016-07-22 MED ORDER — BUDESONIDE-FORMOTEROL FUMARATE 80-4.5 MCG/ACT IN AERO
2.0000 | INHALATION_SPRAY | Freq: Two times a day (BID) | RESPIRATORY_TRACT | 12 refills | Status: DC
Start: 2016-07-22 — End: 2019-10-30

## 2016-07-22 NOTE — Progress Notes (Signed)
   Subjective:    Patient ID: [name], male    DOB: 1955/08/12, 61 y.o.   MRN: [id number]  HPI This patient was seen today for chronic pain  The medication list was reviewed and updated.   -Compliance with medication: yes  - Number patient states they take daily: 2-3 daily  -when was the last dose patient took: this morning   The patient was advised the importance of maintaining medication and not using illegal substances with these.  Refills needed: yes  The patient was educated that we can provide 3 monthly scripts for their medication, it is their responsibility to follow the instructions.  Side effects or complications from medications: none  Patient is aware that pain medications are meant to minimize the severity of the pain to allow their pain levels to improve to allow for better function. They are aware of that pain medications cannot totally remove their pain.  Due for UDT ( at least once per year) : up to date  Patient states that he has no other concerns at this time.  Patient takes his cholesterol medicine without difficulty watch his diet to some degree Patient takes his blood pressure medicine denies cardiac symptoms He is under psychiatric medicines and Xanax with his psychiatrist denies problems with it Takes his restless legs medicine does seem to help Takes pain medicine anywhere from 1-3 times per day denies a causing drowsiness denies abusing it   Patient relates using his inhalers on a regular basis without trouble   Review of Systems He does relate shortness of breath with significant activity but 9 denies any chest pressure tightness pain discomfort denies swelling in the legs no hematuria rectal bleeding vomiting or diarrhea no fever chills    Objective:   Physical Exam Lungs are clear no crackles heart regular pulse normal extremities no edema skin warm dry Prostate exam nl      Assessment & Plan:  Pain medication The patient was seen  today as part of a comprehensive visit regarding pain control. Patient's compliance with the medication as well as discussion regarding effectiveness was completed. Prescriptions were written. Patient was advised to follow-up in 3 months. The patient was assessed for any signs of severe side effects. The patient was advised to take the medicine as directed and to report to [place] if any side effect issues.  HTN good control continue current measures  Emphysema and the importance of staying away from smoking discussed. Also uses Spiriva as well as Symbicort albuterol when necessary  Hyperlipidemia previous labs reviewed with patient continue medication watch diet  Screening for prostate cancer  Patient with some moderate fatigue check CBC  Patient has psychiatric illness followed by Dr. Harrington Challenger being prescribed nerve medication as well as antidepressant.  25 minutes was spent with the patient. Greater than half the time was spent in discussion and answering questions and counseling regarding the issues that the patient came in for today.

## 2016-08-19 ENCOUNTER — Ambulatory Visit (INDEPENDENT_AMBULATORY_CARE_PROVIDER_SITE_OTHER): Payer: Medicare Other | Admitting: Psychiatry

## 2016-08-19 ENCOUNTER — Encounter (HOSPITAL_COMMUNITY): Payer: Self-pay | Admitting: Psychiatry

## 2016-08-19 VITALS — BP 132/89 | HR 67 | Ht 70.0 in | Wt 160.0 lb

## 2016-08-19 DIAGNOSIS — F419 Anxiety disorder, unspecified: Secondary | ICD-10-CM | POA: Diagnosis not present

## 2016-08-19 DIAGNOSIS — F418 Other specified anxiety disorders: Secondary | ICD-10-CM | POA: Diagnosis not present

## 2016-08-19 DIAGNOSIS — F17200 Nicotine dependence, unspecified, uncomplicated: Secondary | ICD-10-CM | POA: Diagnosis not present

## 2016-08-19 DIAGNOSIS — F5105 Insomnia due to other mental disorder: Secondary | ICD-10-CM

## 2016-08-19 DIAGNOSIS — F332 Major depressive disorder, recurrent severe without psychotic features: Secondary | ICD-10-CM

## 2016-08-19 DIAGNOSIS — Z818 Family history of other mental and behavioral disorders: Secondary | ICD-10-CM

## 2016-08-19 MED ORDER — FLUOXETINE HCL 40 MG PO CAPS: 40.0000 mg | ORAL_CAPSULE | Freq: Every day | ORAL | 2 refills | Status: DC

## 2016-08-19 MED ORDER — GABAPENTIN 300 MG PO CAPS: 600.0000 mg | ORAL_CAPSULE | Freq: Three times a day (TID) | ORAL | 2 refills | Status: DC

## 2016-08-19 MED ORDER — FLUOXETINE HCL 20 MG PO CAPS: 20.0000 mg | ORAL_CAPSULE | Freq: Every day | ORAL | 2 refills | Status: DC

## 2016-08-19 MED ORDER — OLANZAPINE 10 MG PO TABS: 10.0000 mg | ORAL_TABLET | Freq: Every day | ORAL | 2 refills | Status: DC

## 2016-08-19 MED ORDER — ALPRAZOLAM 1 MG PO TABS: 1.0000 mg | ORAL_TABLET | Freq: Four times a day (QID) | ORAL | 2 refills | Status: DC

## 2016-08-19 NOTE — Progress Notes (Signed)
Patient ID: [name], male   DOB: 1955/07/02, 61 y.o.   MRN: [id number] Patient ID: [name], male   DOB: 07-10-1955, 61 y.o.   MRN: [id number] Patient ID: [name], male   DOB: February 09, 1956, 61 y.o.   MRN: [id number] Patient ID: [name], male   DOB: 04-09-1956, 61 y.o.   MRN: [id number] Patient ID: [name], male   DOB: 20-Mar-1956, 61 y.o.   MRN: [id number] Patient ID: [name], male   DOB: 1956/01/22, 61 y.o.   MRN: [id number] Patient ID: [name], male   DOB: June 11, 1956, 61 y.o.   MRN: [id number] Patient ID: [name], male   DOB: 01-Jan-1956, 61 y.o.   MRN: [id number] Patient ID: [name], male   DOB: Apr 05, 1956, 61 y.o.   MRN: [id number] Patient ID: [name], male   DOB: 02-11-1956, 61 y.o.   MRN: [id number] Patient ID: [name], male   DOB: Dec 14, 1955, 61 y.o.   MRN: [id number] Patient ID: [name], male   DOB: 1955-12-22, 61 y.o.   MRN: [id number] Patient ID: [name], male   DOB: 29-Sep-1955, 61 y.o.   MRN: [id number] [place] [address] Progress Note [name] MRN: [id number] DOB: 1956-02-23 Age: 61 y.o.  Date: 08/19/2016 Start Time: 9:50 AM End Time: 10:05 AM  Chief Complaint: Chief Complaint  Patient presents with  . Depression  . Anxiety  . Follow-up    Subjective: "I'm still worried about my daughter  This patient is a 61 year old married white male lives with his wife in [place]. He is on disability for coronary artery disease and COPD. He used to work for the [employer] but has not worked for the last 6 years. He has 3 step children 1 adopted child and several grandchildren.  The patient states he's been depressed and anxious primarily since he stopped working 6 years ago. He's also been through several deaths in his family his brother died suddenly 4 years ago. Following that his sister-in-law died in his home 8 months after his brother died. The patient's wife was caring for his wife's mother and she died last  month at the age of 56. All of these things are disturbing to him. He's had trouble sleeping because he is thinking about the deaths. He's more depressed but not suicidal. He's not eating well and has lost about 20 pounds. He also has restless legs and chronic pain in his legs which keep him awake at night. He enjoys hunting and fishing but is less these interests recently.  The patient returns after 3 months. He states that his daughter is back to using drugs and last night she took her 61-year-old son out with her and he's not sure where they went. He's giving it a little bit of time but he is probably going to need to get the police involved. He worries a lot about little boy. Overall his medications are helping his mood sleep and anxiety. His appetite is poor and he has lost about another 6 pounds. I encouraged him to eat. Diagnosis:   Axis I: Anxiety Disorder NOS and Major Depression, Recurrent severe Axis II: Deferred Axis III:  Past Medical History:  Diagnosis Date  . Anxiety   . Anxiety and depression   . ASCVD (arteriosclerotic cardiovascular disease)    inferior MI with circumflex PTCA in 1993;cath in 2000-50% OM 1&2,30% LAD ;2008-25% LAD, 80% nondominant right ,60% small OM 2,EF of  55% with severe basilar inferior hypokinesis; stress nuclear study in 04/2008 moderate inferior scar with peri-infarction ischemic  . Asthma   . CAD (coronary artery disease)   . Carcinoma in situ of colon 2004   rectal polyp  . Colitis, ischemic ([place]) 2011  . COPD (chronic obstructive pulmonary disease) (HCC)    mild ;excercise induced hypoxemia by cp stress test ;asthma ,bronchitis,  . Depression   . Diverticulosis   . Gastritis 12/30/10   EGD Dr [name]  . GERD (gastroesophageal reflux disease)   . H. pylori infection 2004   treated  . Hemorrhoids   . Hiatal hernia   . Hyperlipidemia   . Hypertension   . Inflammatory polyps of colon with rectal bleeding ([place])   . Leukocytosis    [name]   . Lung nodule 07/16/2011  . Restless leg syndrome   . Schatzki's ring   . Sleep apnea    does not use CPAP  . Syncope   . Tobacco abuse    50 pack years continuing at one halp pack daily  . Tubular adenoma of colon 06/2009   Colonosocpy Dr [name]   Axis IV: other psychosocial or environmental problems Axis V: 41-50 serious symptoms  ADL's:  Intact  Sleep: Fair  Appetite:  Poor  Allergies: No Known Allergies Medical History: Past Medical History:  Diagnosis Date  . Anxiety   . Anxiety and depression   . ASCVD (arteriosclerotic cardiovascular disease)    inferior MI with circumflex PTCA in 1993;cath in 2000-50% OM 1&2,30% LAD ;2008-25% LAD, 80% nondominant right ,60% small OM 2,EF of 55% with severe basilar inferior hypokinesis; stress nuclear study in 04/2008 moderate inferior scar with peri-infarction ischemic  . Asthma   . CAD (coronary artery disease)   . Carcinoma in situ of colon 2004   rectal polyp  . Colitis, ischemic ([place]) 2011  . COPD (chronic obstructive pulmonary disease) (HCC)    mild ;excercise induced hypoxemia by cp stress test ;asthma ,bronchitis,  . Depression   . Diverticulosis   . Gastritis 12/30/10   EGD Dr [name]  . GERD (gastroesophageal reflux disease)   . H. pylori infection 2004   treated  . Hemorrhoids   . Hiatal hernia   . Hyperlipidemia   . Hypertension   . Inflammatory polyps of colon with rectal bleeding ([place])   . Leukocytosis    [name]  . Lung nodule 07/16/2011  . Restless leg syndrome   . Schatzki's ring   . Sleep apnea    does not use CPAP  . Syncope   . Tobacco abuse    50 pack years continuing at one halp pack daily  . Tubular adenoma of colon 06/2009   Colonosocpy Dr [name]   Surgical History: Past Surgical History:  Procedure Laterality Date  . CARDIAC CATHETERIZATION    . CHOLECYSTECTOMY  2004  . COLONOSCOPY  06/2009   normal terminal ileum, segmental mild inflammation of sigmoid colon (bx unremarkable), polyp,  tubular adenoma  . COLONOSCOPY N/A 07/31/2013   Dr.Rourk- redundant anal canal hemorrhoids, colonic diverticulosis, tubular adenoma  . COLONOSCOPY W/ POLYPECTOMY  2004   rectal polyp with carcinoma in situ removed via colonoscopy  . COLONOSCOPY WITH PROPOFOL N/A 09/15/2015   Procedure: COLONOSCOPY WITH PROPOFOL;  Surgeon: [name], MD;  [facility];  Service: Endoscopy;  Laterality: N/A;  1015  . CORONARY ANGIOPLASTY WITH STENT PLACEMENT  01/26/2012   "1; total is now 2"  . ESOPHAGOGASTRODUODENOSCOPY  02/2009   query Barrett's but bx negative  . ESOPHAGOGASTRODUODENOSCOPY  12/30/2010   [name],gastritis, dilated 5F, sm HH, 1 small ulcer, Duodenal erosions, benign bx  . ESOPHAGOGASTRODUODENOSCOPY (EGD) WITH ESOPHAGEAL DILATION N/A 09/12/2012   [id number] Schatzki's ring s/p Maloney dilator. Small hiatal hernia. negative path  . ESOPHAGOGASTRODUODENOSCOPY (EGD) WITH ESOPHAGEAL DILATION N/A 07/31/2013   Dr. [name]- normal egd, s/p [facility] dilation empirically. Normal small bowel biopsies   . ESOPHAGOGASTRODUODENOSCOPY (EGD) WITH PROPOFOL N/A 09/15/2015   Procedure: ESOPHAGOGASTRODUODENOSCOPY (EGD) WITH PROPOFOL;  Surgeon: [name], MD;  [facility];  Service: Endoscopy;  Laterality: N/A;  . FLEXIBLE SIGMOIDOSCOPY  12/30/2010    [name],; internal hemorrhoids, anal papilla  . HAND SURGERY     surgical intervention for injury of the fingers of the left hand many years ago  . heart stent    . HEMORRHOID BANDING     Dr. [name]  . LEFT HEART CATHETERIZATION WITH CORONARY ANGIOGRAM N/A 01/26/2012   Procedure: LEFT HEART CATHETERIZATION WITH CORONARY ANGIOGRAM;  Surgeon: [name], MD;  Location: [facility] C[facility] LAB;  Service: Cardiovascular;  Laterality: N/A;  . [name] DILATION N/A 09/15/2015   Procedure: [name] DILATION;  Surgeon: [name], MD;  [facility];  Service: Endoscopy;  Laterality: N/A;  . NASAL SEPTOPLASTY W/ TURBINOPLASTY   10/06/2011   Procedure: NASAL SEPTOPLASTY WITH TURBINATE REDUCTION;  Surgeon: Izora Gala, MD;  Location: [facility];  Service: ENT;  Laterality: Bilateral;  . PERCUTANEOUS CORONARY STENT INTERVENTION (PCI-S) N/A 01/26/2012   Procedure: PERCUTANEOUS CORONARY STENT INTERVENTION (PCI-S);  Surgeon: [name], MD;  Location[facility] C[facility] LAB;  Service: Cardiovascular;  Laterality: N/A;  . POLYPECTOMY  09/15/2015   Procedure: POLYPECTOMY;  Surgeon: [name], MD;  [facility];  Service: Endoscopy;;  Cecal polyp removed via cold snare/ Descending colon polyp removed via cold snare   Family History: family history includes Cancer in his maternal aunt and paternal uncle; Depression in his mother; Heart disease in his mother; Hypertension in his brother; Kidney failure in his maternal uncle; Lung disease in his father. Reviewed and nothing new today.  Suicidal Ideation:  Pt denies Homicidal Ideation:  Pt has some struggles with his daughter's boy friend.  He stays away from him. AEB (as evidenced by):per pt report  Psychiatric Specialty Exam: ROS Neuro: no headaches, ataxia, weakness, gives out easily GI: no N/V/cramps/constipation, he keeps diarrhea for the past 6 or 7 years MS: no weakness or aches. He catches cramps really bad.   Blood pressure 132/89, pulse 67, height 5\' 10"  (1.778 m), weight 160 lb (72.6 kg).Body mass index is 22.96 kg/m.  General Appearance: Casual  Eye Contact::  Fair  Speech:  Clear and Coherent  Volume:  Normal  Mood: worried   Affect: Constricted   Thought Process:  Coherent, Linear and Logical  Orientation:  Full (Time, Place, and Person)  Thought Content:  Within normal limits   Suicidal Thoughts:  no  Homicidal Thoughts:  No  Memory:  Immediate;   Fair Recent;   Fair Remote;   Fair  Judgement:  Fair  Insight:  Fair  Psychomotor Activity:  Normal  Concentration:  Fair  Recall:  Fair  Akathisia:  No  Handed:  Right  AIMS (if indicated):      Assets:  Communication Skills Desire for Improvement  Sleep:      Current Medications: Zyprexa 10 mg at bed time Xanax 1 mg TID Neurontin 600  mg 3 times a day Prozac 40 mg per day Lab Results:  No results found for this or any previous visit (from the past 48 hour(s)). Marland Kitchen  Physical Findings: AIMS:  , ,  ,  ,    CIWA:    COWS:     Treatment Plan Summary: Medication management  Plan: I took his vitals.  I reviewed CC, tobacco/med/surg Hx, meds effects/ side effects, problem list, therapies and responses as well as current situation/symptoms discussed options. He will continue Prozac  60 mg daily, Zyprexa for mood stabilization and Neurontin and Xanax for anxiety He'll return to see me in 3 months but call if symptoms worsen before that.  See orders and pt instructions for more details.  MEDICATIONS this encounter: Meds ordered this encounter  Medications  . FLUoxetine (PROZAC) 20 MG capsule    Sig: Take 1 capsule (20 mg total) by mouth daily.    Dispense:  90 capsule    Refill:  2  . FLUoxetine (PROZAC) 40 MG capsule    Sig: Take 1 capsule (40 mg total) by mouth daily.    Dispense:  90 capsule    Refill:  2  . gabapentin (NEURONTIN) 300 MG capsule    Sig: Take 2 capsules (600 mg total) by mouth 3 (three) times daily.    Dispense:  180 capsule    Refill:  2  . OLANZapine (ZYPREXA) 10 MG tablet    Sig: Take 1 tablet (10 mg total) by mouth at bedtime.    Dispense:  90 tablet    Refill:  2  . ALPRAZolam (XANAX) 1 MG tablet    Sig: Take 1 tablet (1 mg total) by mouth 4 (four) times daily.    Dispense:  120 tablet    Refill:  2    Medical Decision Making Problem Points:  Established problem, stable/improving (1), Review of last therapy session (1) and Review of psycho-social stressors (1) Data Points:  Review or order clinical lab tests (1) Review of medication regiment & side effects (2)  I certify that outpatient services furnished can reasonably be expected to  improve the patient's condition.   [name] 08/19/2016, 1:21 PM   Patient ID: [name] HAGOOD, male   DOB: February 03, 1956, 61 y.o.   MRN: [id number]

## 2016-10-02 LAB — BASIC METABOLIC PANEL
BUN / CREAT RATIO: 14 (ref 10–24)
BUN: 13 mg/dL (ref 8–27)
CO2: 25 mmol/L (ref 18–29)
Calcium: 9.3 mg/dL (ref 8.6–10.2)
Chloride: 102 mmol/L (ref 96–106)
Creatinine, Ser: 0.9 mg/dL (ref 0.76–1.27)
GFR, EST AFRICAN AMERICAN: 107 mL/min/{1.73_m2} (ref >59)
GFR, EST NON AFRICAN AMERICAN: 93 mL/min/{1.73_m2} (ref >59)
Glucose: 94 mg/dL (ref 65–99)
Potassium: 4.4 mmol/L (ref 3.5–5.2)
Sodium: 143 mmol/L (ref 134–144)

## 2016-10-02 LAB — LIPID PANEL
CHOL/HDL RATIO: 3.8 ratio (ref 0.0–5.0)
CHOLESTEROL TOTAL: 128 mg/dL (ref 100–199)
HDL: 34 mg/dL — AB (ref >39)
LDL Calculated: 68 mg/dL (ref 0–99)
TRIGLYCERIDES: 131 mg/dL (ref 0–149)
VLDL Cholesterol Cal: 26 mg/dL (ref 5–40)

## 2016-10-02 LAB — CBC WITH DIFFERENTIAL/PLATELET
Basophils Absolute: 0 10*3/uL (ref 0.0–0.2)
Basos: 0 %
EOS (ABSOLUTE): 0.1 10*3/uL (ref 0.0–0.4)
Eos: 1 %
HEMOGLOBIN: 16.3 g/dL (ref 13.0–17.7)
Hematocrit: 45.7 % (ref 37.5–51.0)
IMMATURE GRANS (ABS): 0 10*3/uL (ref 0.0–0.1)
IMMATURE GRANULOCYTES: 0 %
LYMPHS: 23 %
Lymphocytes Absolute: 2 10*3/uL (ref 0.7–3.1)
MCH: 31.5 pg (ref 26.6–33.0)
MCHC: 35.7 g/dL (ref 31.5–35.7)
MCV: 88 fL (ref 79–97)
MONOCYTES: 4 %
Monocytes Absolute: 0.4 10*3/uL (ref 0.1–0.9)
NEUTROS ABS: 5.9 10*3/uL (ref 1.4–7.0)
Neutrophils: 72 %
Platelets: 218 10*3/uL (ref 150–379)
RBC: 5.18 x10E6/uL (ref 4.14–5.80)
RDW: 13.3 % (ref 12.3–15.4)
WBC: 8.4 10*3/uL (ref 3.4–10.8)

## 2016-10-02 LAB — PSA: Prostate Specific Ag, Serum: 1 ng/mL (ref 0.0–4.0)

## 2016-10-03 ENCOUNTER — Encounter: Payer: Self-pay | Admitting: Family Medicine

## 2016-10-05 ENCOUNTER — Encounter: Payer: Self-pay | Admitting: Cardiology

## 2016-10-05 ENCOUNTER — Ambulatory Visit (INDEPENDENT_AMBULATORY_CARE_PROVIDER_SITE_OTHER): Payer: Medicare Other | Admitting: Cardiology

## 2016-10-05 ENCOUNTER — Other Ambulatory Visit: Payer: Self-pay | Admitting: *Deleted

## 2016-10-05 VITALS — BP 118/62 | HR 73 | Ht 70.0 in | Wt 167.0 lb

## 2016-10-05 DIAGNOSIS — I2510 Atherosclerotic heart disease of native coronary artery without angina pectoris: Secondary | ICD-10-CM

## 2016-10-05 DIAGNOSIS — G4733 Obstructive sleep apnea (adult) (pediatric): Secondary | ICD-10-CM | POA: Diagnosis not present

## 2016-10-05 DIAGNOSIS — I10 Essential (primary) hypertension: Secondary | ICD-10-CM | POA: Diagnosis not present

## 2016-10-05 DIAGNOSIS — E782 Mixed hyperlipidemia: Secondary | ICD-10-CM | POA: Diagnosis not present

## 2016-10-05 MED ORDER — LISINOPRIL 2.5 MG PO TABS: 2.5000 mg | ORAL_TABLET | Freq: Every day | ORAL | 1 refills | Status: DC

## 2016-10-05 NOTE — Progress Notes (Signed)
Clinical Summary Mr. Shackett is a 61 y.o.male seen today for follow up of the following medical problems.   1. 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 LVEF 50-55% by LV gram.  08/2011 echo LVEF 53%, grade I diastolic dysfunction.  03/2016 echo: LVEF 66%, normal diastolic function   - no recent chest pain. Occasional SOB at night time. - compliant with meds.    2. Hyperlipidemia - compliant with statin - 09/2016 TC 128 TG 131 HDL 34 LDL 68   3. HTN - compliant with meds - does not check regularly at home   4. COPD - followed by pcp   5. OSA - does not use CPAP machine due to discomfort   6. GERD/Dysphagia - followed by GI      SH: enjoys working in yard, fishing.    Past Medical History:  Diagnosis Date  . Anxiety   . Anxiety and depression   . ASCVD (arteriosclerotic cardiovascular disease)    inferior MI with circumflex PTCA in 1993;cath in 2000-50% OM 1&2,30% LAD ;2008-25% LAD, 80% nondominant right ,60% small OM 2,EF of 55% with severe basilar inferior hypokinesis; stress nuclear study in 04/2008 moderate inferior scar with peri-infarction ischemic  . Asthma   . CAD (coronary artery disease)   . Carcinoma in situ of colon 2004   rectal polyp  . Colitis, ischemic ([place]) 2011  . COPD (chronic obstructive pulmonary disease) (HCC)    mild ;excercise induced hypoxemia by cp stress test ;asthma ,bronchitis,  . Depression   . Diverticulosis   . Gastritis 12/30/10   EGD Dr [name]  . GERD (gastroesophageal reflux disease)   . H. pylori infection 2004   treated  . Hemorrhoids   . Hiatal hernia   . Hyperlipidemia   . Hypertension   . Inflammatory polyps of colon with rectal bleeding ([place])   . Leukocytosis    [name]  . Lung nodule 07/16/2011  . Restless leg syndrome   . Schatzki's ring    . Sleep apnea    does not use CPAP  . Syncope   . Tobacco abuse    50 pack years continuing at one halp pack daily  . Tubular adenoma of colon 06/2009   Colonosocpy Dr [name]     No Known Allergies   Current Outpatient Prescriptions  Medication Sig Dispense Refill  . albuterol (PROVENTIL) (2.5 MG/3ML) 0.083% nebulizer solution Take 2.5 mg by nebulization every 6 (six) hours as needed. For shortness of breath    . ALPRAZolam (XANAX) 1 MG tablet Take 1 tablet (1 mg total) by mouth 4 (four) times daily. 120 tablet 2  . aspirin EC 81 MG tablet Take 81 mg by mouth daily.     Marland Kitchen atorvastatin (LIPITOR) 80 MG tablet TAKE 1 TABLET EVERY DAY 90 tablet 3  . budesonide-formoterol (SYMBICORT) 80-4.5 MCG/ACT inhaler Inhale 2 puffs into the lungs 2 (two) times daily. 1 Inhaler 12  . FLUoxetine (PROZAC) 20 MG capsule Take 1 capsule (20 mg total) by mouth daily. 90 capsule 2  . FLUoxetine (PROZAC) 40 MG capsule Take 1 capsule (40 mg total) by mouth daily. 90 capsule 2  . gabapentin (NEURONTIN) 300 MG capsule Take 2 capsules (600 mg total) by mouth 3 (three) times daily. 180 capsule 2  .  HYDROcodone-acetaminophen (NORCO) 7.5-325 MG tablet Take 1 tablet by mouth 2 (two) times daily as needed for moderate pain. 60 tablet 0  . hydrocortisone (ANUSOL-HC) 2.5 % rectal cream Place 1 application rectally as needed. Reported on 10/21/2015    . lisinopril (PRINIVIL,ZESTRIL) 2.5 MG tablet Take 1 tablet (2.5 mg total) by mouth daily. 30 tablet 12  . metoprolol succinate (TOPROL-XL) 25 MG 24 hr tablet TAKE 1 TABLET EVERY DAY 90 tablet 1  . nitroGLYCERIN (NITROSTAT) 0.4 MG SL tablet Place 0.4 mg under the tongue every 5 (five) minutes as needed for chest pain.    Marland Kitchen OLANZapine (ZYPREXA) 10 MG tablet Take 1 tablet (10 mg total) by mouth at bedtime. 90 tablet 2  . pantoprazole (PROTONIX) 40 MG tablet Take 1 tablet (40 mg total) by mouth 2 (two) times daily. 180 tablet 3  . promethazine (PHENERGAN) 12.5 MG tablet Take 12.5  mg by mouth every 6 (six) hours as needed for nausea or vomiting. Reported on 10/21/2015    . rOPINIRole (REQUIP) 0.5 MG tablet TAKE 1 TABLET EVERY DAY AT BEDTIME 90 tablet 1  . vitamin B-12 (CYANOCOBALAMIN) 1000 MCG tablet Take 2,000 mcg by mouth daily.     No current facility-administered medications for this visit.      Past Surgical History:  Procedure Laterality Date  . CARDIAC CATHETERIZATION    . CHOLECYSTECTOMY  2004  . COLONOSCOPY  06/2009   normal terminal ileum, segmental mild inflammation of sigmoid colon (bx unremarkable), polyp, tubular adenoma  . COLONOSCOPY N/A 07/31/2013   Dr.Rourk- redundant anal canal hemorrhoids, colonic diverticulosis, tubular adenoma  . COLONOSCOPY W/ POLYPECTOMY  2004   rectal polyp with carcinoma in situ removed via colonoscopy  . COLONOSCOPY WITH PROPOFOL N/A 09/15/2015   Procedure: COLONOSCOPY WITH PROPOFOL;  Surgeon: [name], MD;  [facility];  Service: Endoscopy;  Laterality: N/A;  1015  . CORONARY ANGIOPLASTY WITH STENT PLACEMENT  01/26/2012   "1; total is now 2"  . ESOPHAGOGASTRODUODENOSCOPY  02/2009   query Barrett's but bx negative  . ESOPHAGOGASTRODUODENOSCOPY  12/30/2010   [name],gastritis, dilated 76F, sm HH, 1 small ulcer, Duodenal erosions, benign bx  . ESOPHAGOGASTRODUODENOSCOPY (EGD) WITH ESOPHAGEAL DILATION N/A 09/12/2012   WRU:[id number] Schatzki's ring s/p Maloney dilator. Small hiatal hernia. negative path  . ESOPHAGOGASTRODUODENOSCOPY (EGD) WITH ESOPHAGEAL DILATION N/A 07/31/2013   Dr. [name]- normal egd, s/p [facility] dilation empirically. Normal small bowel biopsies   . ESOPHAGOGASTRODUODENOSCOPY (EGD) WITH PROPOFOL N/A 09/15/2015   Procedure: ESOPHAGOGASTRODUODENOSCOPY (EGD) WITH PROPOFOL;  Surgeon: [name], MD;  [facility];  Service: Endoscopy;  Laterality: N/A;  . FLEXIBLE SIGMOIDOSCOPY  12/30/2010    [name],; internal hemorrhoids, anal papilla  . HAND SURGERY     surgical  intervention for injury of the fingers of the left hand many years ago  . heart stent    . HEMORRHOID BANDING     Dr. [name]  . LEFT HEART CATHETERIZATION WITH CORONARY ANGIOGRAM N/A 01/26/2012   Procedure: LEFT HEART CATHETERIZATION WITH CORONARY ANGIOGRAM;  Surgeon: [name], MD;  Location: [facility] CATH LAB;  Service: Cardiovascular;  Laterality: N/A;  . [name] DILATION N/A 09/15/2015   Procedure: [name] DILATION;  Surgeon: [name], MD;  [facility];  Service: Endoscopy;  Laterality: N/A;  . NASAL SEPTOPLASTY W/ TURBINOPLASTY  10/06/2011   Procedure: NASAL SEPTOPLASTY WITH TURBINATE REDUCTION;  Surgeon: Izora Gala, MD;  Location: [place];  Service: ENT;  Laterality:  Bilateral;  . PERCUTANEOUS CORONARY STENT INTERVENTION (PCI-S) N/A 01/26/2012   Procedure: PERCUTANEOUS CORONARY STENT INTERVENTION (PCI-S);  Surgeon: [name], MD;  Location: [facility] CATH LAB;  Service: Cardiovascular;  Laterality: N/A;  . POLYPECTOMY  09/15/2015   Procedure: POLYPECTOMY;  Surgeon: [name], MD;  [facility];  Service: Endoscopy;;  Cecal polyp removed via cold snare/ Descending colon polyp removed via cold snare     No Known Allergies    Family History  Problem Relation Age of Onset  . Lung disease Father     deceased, black lung  . Heart disease Mother     blood clots  . Depression Mother   . Cancer Paternal Uncle     unknown type  . Cancer Maternal Aunt     unknown type  . Kidney failure Maternal Uncle   . Hypertension Brother   . Colon cancer Neg Hx   . ADD / ADHD Neg Hx   . Alcohol abuse Neg Hx   . Drug abuse Neg Hx   . Anxiety disorder Neg Hx   . Bipolar disorder Neg Hx   . Dementia Neg Hx   . OCD Neg Hx   . Paranoid behavior Neg Hx   . Schizophrenia Neg Hx   . Physical abuse Neg Hx   . Sexual abuse Neg Hx   . Seizures Neg Hx      Social History Mr. Cerda reports that he has been smoking Cigarettes.  He has a 15.00 pack-year smoking history. He  has quit using smokeless tobacco. Mr. Caudill reports that he does not drink alcohol.   Review of Systems CONSTITUTIONAL: No weight loss, fever, chills, weakness or fatigue.  HEENT: Eyes: No visual loss, blurred vision, double vision or yellow sclerae.No hearing loss, sneezing, congestion, runny nose or sore throat.  SKIN: No rash or itching.  CARDIOVASCULAR: per HPI RESPIRATORY: No shortness of breath, cough or sputum.  GASTROINTESTINAL: No anorexia, nausea, vomiting or diarrhea. No abdominal pain or blood.  GENITOURINARY: No burning on urination, no polyuria NEUROLOGICAL: No headache, dizziness, syncope, paralysis, ataxia, numbness or tingling in the extremities. No change in bowel or bladder control.  MUSCULOSKELETAL: No muscle, back pain, joint pain or stiffness.  LYMPHATICS: No enlarged nodes. No history of splenectomy.  PSYCHIATRIC: No history of depression or anxiety.  ENDOCRINOLOGIC: No reports of sweating, cold or heat intolerance. No polyuria or polydipsia.  Marland Kitchen   Physical Examination Vitals:   10/05/16 1128  BP: 118/62  Pulse: 73   Vitals:   10/05/16 1128  Weight: 167 lb (75.8 kg)  Height: 5\' 10"  (1.778 m)    Gen: resting comfortably, no acute distress HEENT: no scleral icterus, pupils equal round and reactive, no palptable cervical adenopathy,  CV: RRR, no m/r/g, no jvd Resp: Clear to auscultation bilaterally GI: abdomen is soft, non-tender, non-distended, normal bowel sounds, no hepatosplenomegaly MSK: extremities are warm, no edema.  Skin: warm, no rash Neuro:  no focal deficits Psych: appropriate affect   Diagnostic Studies 02/2010 Cath DESCRIPTION OF PROCEDURE: The risks and indication were explained. Consent was signed and placed on the chart. A 4-French arterial sheath was placed in the right femoral artery using a modified Seldinger technique. A standard catheters including a JL-4, 3DRC, and angled pigtail were used. All catheter exchanges were made  over wire. There were no apparent complications. Central aortic pressure 109/67 with a mean of 86. LV pressure 110/70 with an EDP of 28. There was no aortic stenosis.  Left main was normal.  LAD was a long vessel coursing to the apex, it gave off a moderate-sized diagonal branch. There was a 40% lesion in the proximal LAD, otherwise minimal luminal irregularities.  Left circumflex was a large dominant vessel, gave off a narrow caliber ramus, large OM-1, and narrow caliber OM-2 in several posterolaterals. In the proximal left circumflex, he had a 40% lesion which extended into the proximal portion of the OM-1. There was a 30% lesion in the mid OM- 1. In the OM-2, there was a long 80% stenosis throughout the ostial and proximal portion. This vessel was very small caliber vessel.  Right coronary artery was a nondominant vessel, gave off an RV branch. There was approximately 90% stenosis in the proximal portion. Once again, this was a very small nondominant vessel.  Left ventriculogram done in the RAO position showed an EF of 50-55% with inferior basilar akinesis. There was no significant mitral regurgitation.  Abdominal aortogram showed 40% ostial lesion in the right renal artery. The left renal artery was widely patent. There was no abdominal aortic aneurysm. On panning down over the iliac and femoral system, there was no high-grade lesions.  ASSESSMENT: 1. Stable coronary artery disease with high-grade lesions in the small  obtuse marginal 2 and right coronary artery both of which are too  small for percutaneous intervention. 2. Left ventricular ejection fraction was 50-55% with mildly elevated  filling pressures. 3. Very mild right renal artery stenosis. 4. No obvious high-grade proximal peripheral arterial disease.  PLAN/DISCUSSION: I suspect most of his dyspnea is likely due to his lung disease. I have urged him to stop smoking. We will  continue medical therapy now for his heart disease.   08/2011 Echo Study Conclusions  - Left ventricle: The cavity size was normal. There was mild focal basal hypertrophy of the septum. The estimated ejection fraction was 55%. There is akinesis of the basalinferior myocardium. Doppler parameters are consistent with abnormal left ventricular relaxation (grade 1 diastolic dysfunction). - Mitral valve: Trivial regurgitation. - Left atrium: The atrium was at the upper limits of normal in size. - Tricuspid valve: Trivial regurgitation. - Pulmonary arteries: PA peak pressure: 74mm Hg (S). - Pericardium, extracardiac: There was no pericardial effusion.  09/2015 ABIs: 1.2 bilaterally   03/2016 echo Study Conclusions  - Left ventricle: The cavity size was normal. Wall thickness was   normal. The estimated ejection fraction was 50%. There is   akinesis of the basal-midinferolateral and inferior myocardium.   Left ventricular diastolic function parameters were normal. - Aortic valve: Mildly calcified annulus. Trileaflet. - Mitral valve: Mildly thickened leaflets . There was trivial   regurgitation. - Left atrium: The atrium was at the upper limits of normal in   size. - Right atrium: Central venous pressure (est): 3 mm Hg. - Atrial septum: No defect or patent foramen ovale was identified. - Tricuspid valve: There was trivial regurgitation. - Pulmonary arteries: PA peak pressure: 9 mm Hg (S). - Pericardium, extracardiac: There was no pericardial effusion.  Impressions:  - Normal LV wall thickness with LVEF approximately 50%. There is   akinesis of the mid to basal inferolateral/inferior wall. Normal   diastolic function. Upper normal left atrial chamber size. Mildly   thickened mitral leaflets with trivial mitral regurgitation.   Mildly sclerotic aortic valve. Trivial tricuspid regurgitation.  Assessment and Plan  1. CAD - no current chest pain, continue current meds -  EKG in clinic shows SR, no acute ischemic changes.  2. Hyperlipidemia -continue statin, lipids well controlled  3. HTN - bp is at goal, continue current meds  4. COPD - per pcp  5. OSA - will refer to Dr Luan Pulling     F/u 6 months      Arnoldo Lenis, M.D.

## 2016-10-05 NOTE — Patient Instructions (Signed)
Your physician wants you to follow-up in: [place] will receive a reminder letter in the mail two months in advance. If you don't receive a letter, please call our office to schedule the follow-up appointment.  Your physician recommends that you continue on your current medications as directed. Please refer to the Current Medication list given to you today.  PLEASE CALL DR Luan Pulling AT [phone] TO SEE WHEN YOU ARE TO FOLLOW UP  Thank you for choosing [facility]!!

## 2016-10-19 ENCOUNTER — Ambulatory Visit (INDEPENDENT_AMBULATORY_CARE_PROVIDER_SITE_OTHER): Payer: Medicare Other | Admitting: Family Medicine

## 2016-10-19 ENCOUNTER — Encounter: Payer: Self-pay | Admitting: Family Medicine

## 2016-10-19 VITALS — BP 110/80 | Ht 70.0 in | Wt 165.5 lb

## 2016-10-19 DIAGNOSIS — K219 Gastro-esophageal reflux disease without esophagitis: Secondary | ICD-10-CM

## 2016-10-19 DIAGNOSIS — I10 Essential (primary) hypertension: Secondary | ICD-10-CM | POA: Diagnosis not present

## 2016-10-19 DIAGNOSIS — E784 Other hyperlipidemia: Secondary | ICD-10-CM

## 2016-10-19 DIAGNOSIS — Z79891 Long term (current) use of opiate analgesic: Secondary | ICD-10-CM | POA: Diagnosis not present

## 2016-10-19 DIAGNOSIS — E7849 Other hyperlipidemia: Secondary | ICD-10-CM

## 2016-10-19 DIAGNOSIS — J438 Other emphysema: Secondary | ICD-10-CM

## 2016-10-19 MED ORDER — HYDROCODONE-ACETAMINOPHEN 7.5-325 MG PO TABS: 1.0000 | ORAL_TABLET | Freq: Two times a day (BID) | ORAL | 0 refills | Status: DC | PRN

## 2016-10-19 MED ORDER — METOPROLOL SUCCINATE ER 25 MG PO TB24: 25.0000 mg | ORAL_TABLET | Freq: Every day | ORAL | 1 refills | Status: DC

## 2016-10-19 MED ORDER — ROPINIROLE HCL 2 MG PO TABS: 2.0000 mg | ORAL_TABLET | Freq: Every day | ORAL | 2 refills | Status: DC

## 2016-10-19 MED ORDER — FLUTICASONE PROPIONATE 50 MCG/ACT NA SUSP: 2.0000 | Freq: Every day | NASAL | 5 refills | Status: DC

## 2016-10-19 NOTE — Progress Notes (Signed)
Subjective:    Patient ID: [name], male    DOB: [birth year], 61 y.o.   MRN: [id number]  HPI This patient was seen today for chronic pain  The medication list was reviewed and updated.   -Compliance with medication: Takes daily   - Number patient states they take daily: States takes 2 per day.  -when was the last dose patient took? Last dose this am  The patient was advised the importance of maintaining medication and not using illegal substances with these.  Refills needed: Yes   The patient was educated that we can provide 3 monthly scripts for their medication, it is their responsibility to follow the instructions.  Side effects or complications from medications: None   Patient is aware that pain medications are meant to minimize the severity of the pain to allow their pain levels to improve to allow for better function. They are aware of that pain medications cannot totally remove their pain.  Due for UDT ( at least once per year) : 01/20/2017 Patient does use Xanax on a regular basis by his psychiatrist. Patient is aware not to take his pain medicine at bedtime. Patient states pain medicine does not cause drowsiness Patient is aware of increased risk of accidental overdose when combining Xanax with pain medicine Patients COPD medicine is doing well for him Patient smokes he knows he needs to quit Patient has been on long-term pain medicine for back pain he states it does a good job of allowing him to function   Patient states no other concerns this visit.  Review of Systems Denies chest tightness pressure pain shortness of breath except when he tries to do stuff he does get a little short winded denies dysphagia denies rectal bleeding relates back pain no sciatica    Objective:   Physical Exam Neck no masses lungs are clear no crackles heart regular pulse normal BP good       Assessment & Plan:  The patient was seen today as part of a comprehensive visit  regarding pain control. Patient's compliance with the medication as well as discussion regarding effectiveness was completed. Prescriptions were written. Patient was advised to follow-up in 3 months. The patient was assessed for any signs of severe side effects. The patient was advised to take the medicine as directed and to report to [place] if any side effect issues.Pain scripts was written. Drug profile was checked from the state  HTN- Patient was seen today as part of a visit regarding hypertension. The importance of healthy diet and regular physical activity was discussed. The importance of compliance with medications discussed. Ideal goal is to keep blood pressure low elevated levels certainly below 941/74 when possible. The patient was counseled that keeping blood pressure under control lessen his risk of heart attack, stroke, kidney failure, and early death. The importance of regular follow-ups was discussed with the patient. Low-salt diet such as DASH recommended. Regular physical activity was recommended as well. Patient was advised to keep regular follow-ups.  The patient was seen today as part of an evaluation regarding hyperlipidemia. Recent lab work has been reviewed with the patient as well as the goals for good cholesterol care. In addition to this medications have been discussed the importance of compliance with diet and medications discussed as well. Patient has been informed of potential side effects of medications in the importance to notify us should any problems occur. Finally the patient is aware that poor control of cholesterol, noncompliance can dramatically  increase her risk of heart attack strokes and premature death. The patient will keep regular office visits and the patient does agreed to periodic lab work.  COPD stable continue current measures he states he will follow-up Dr. Luan Pulling somewhere within the next couple months  Reflux under good control medication  Patient follows up  with psychiatry on a regular basis  25 minutes was spent with the patient. Greater than half the time was spent in discussion and answering questions and counseling regarding the issues that the patient came in for today. No labs indicated today Recheck 3 months

## 2016-11-19 ENCOUNTER — Encounter (HOSPITAL_COMMUNITY): Payer: Self-pay | Admitting: Psychiatry

## 2016-11-19 ENCOUNTER — Ambulatory Visit (INDEPENDENT_AMBULATORY_CARE_PROVIDER_SITE_OTHER): Payer: Medicare Other | Admitting: Psychiatry

## 2016-11-19 VITALS — BP 112/76 | HR 74 | Ht 70.0 in | Wt 164.2 lb

## 2016-11-19 DIAGNOSIS — F419 Anxiety disorder, unspecified: Secondary | ICD-10-CM

## 2016-11-19 DIAGNOSIS — Z841 Family history of disorders of kidney and ureter: Secondary | ICD-10-CM

## 2016-11-19 DIAGNOSIS — Z8249 Family history of ischemic heart disease and other diseases of the circulatory system: Secondary | ICD-10-CM

## 2016-11-19 DIAGNOSIS — F332 Major depressive disorder, recurrent severe without psychotic features: Secondary | ICD-10-CM

## 2016-11-19 DIAGNOSIS — Z836 Family history of other diseases of the respiratory system: Secondary | ICD-10-CM

## 2016-11-19 DIAGNOSIS — F17200 Nicotine dependence, unspecified, uncomplicated: Secondary | ICD-10-CM | POA: Diagnosis not present

## 2016-11-19 DIAGNOSIS — G8929 Other chronic pain: Secondary | ICD-10-CM | POA: Diagnosis not present

## 2016-11-19 DIAGNOSIS — G2581 Restless legs syndrome: Secondary | ICD-10-CM

## 2016-11-19 DIAGNOSIS — I2510 Atherosclerotic heart disease of native coronary artery without angina pectoris: Secondary | ICD-10-CM | POA: Diagnosis not present

## 2016-11-19 DIAGNOSIS — J449 Chronic obstructive pulmonary disease, unspecified: Secondary | ICD-10-CM

## 2016-11-19 DIAGNOSIS — F418 Other specified anxiety disorders: Secondary | ICD-10-CM

## 2016-11-19 DIAGNOSIS — Z79899 Other long term (current) drug therapy: Secondary | ICD-10-CM

## 2016-11-19 DIAGNOSIS — Z818 Family history of other mental and behavioral disorders: Secondary | ICD-10-CM

## 2016-11-19 DIAGNOSIS — F5105 Insomnia due to other mental disorder: Secondary | ICD-10-CM

## 2016-11-19 MED ORDER — GABAPENTIN 300 MG PO CAPS: 600.0000 mg | ORAL_CAPSULE | Freq: Three times a day (TID) | ORAL | 2 refills | Status: DC

## 2016-11-19 MED ORDER — OLANZAPINE 10 MG PO TABS: 10.0000 mg | ORAL_TABLET | Freq: Every day | ORAL | 2 refills | Status: DC

## 2016-11-19 MED ORDER — ALPRAZOLAM 1 MG PO TABS: 1.0000 mg | ORAL_TABLET | Freq: Four times a day (QID) | ORAL | 2 refills | Status: DC

## 2016-11-19 MED ORDER — FLUOXETINE HCL 20 MG PO CAPS: 20.0000 mg | ORAL_CAPSULE | Freq: Every day | ORAL | 2 refills | Status: DC

## 2016-11-19 MED ORDER — FLUOXETINE HCL 40 MG PO CAPS: 40.0000 mg | ORAL_CAPSULE | Freq: Every day | ORAL | 2 refills | Status: DC

## 2016-11-19 NOTE — Progress Notes (Signed)
Patient ID: JAYMEN FETCH, male   DOB: 08-06-1955, 61 y.o.   MRN: [id number] Patient ID: RITHWIK SCHMIEG, male   DOB: [birth year], 61 y.o.   MRN: [id number] Patient ID: MERDITH BOYD, male   DOB: Mar 22, 1956, 61 y.o.   MRN: [id number] Patient ID: GOVIND FUREY, male   DOB: 09-Nov-1955, 61 y.o.   MRN: [id number] Patient ID: FORREST JAROSZEWSKI, male   DOB: 09-08-1955, 61 y.o.   MRN: [id number] Patient ID: SIRRON FRANCESCONI, male   DOB: 15-Dec-1955, 61 y.o.   MRN: [id number] Patient ID: BRYKER FLETCHALL, male   DOB: 02-May-1956, 61 y.o.   MRN: [id number] Patient ID: BULMARO FEAGANS, male   DOB: [birth year], 61 y.o.   MRN: [id number] Patient ID: JOSHUAJAMES MOEHRING, male   DOB: 05-16-1956, 61 y.o.   MRN: [id number] Patient ID: LAYTEN AIKEN, male   DOB: 14-Dec-1955, 61 y.o.   MRN: [id number] Patient ID: MAJED PELLEGRIN, male   DOB: 1956/01/29, 61 y.o.   MRN: [id number] Patient ID: OLLEN RAO, male   DOB: 02/11/1956, 61 y.o.   MRN: [id number] Patient ID: HYLAND MOLLENKOPF, male   DOB: 03/20/1956, 61 y.o.   MRN: [id number] [place] [address] Progress Note LYNDLE PANG MRN: [id number] DOB: 07/14/1955 Age: 61 y.o.  Date: 11/19/2016 Start Time: 9:50 AM End Time: 10:05 AM  Chief Complaint: Chief Complaint  Patient presents with  . Anxiety  . Depression  . Follow-up    Subjective: "I'm doing ok"  This patient is a 61 year old married white male lives with his wife in [place]. He is on disability for coronary artery disease and COPD. He used to work for the [employer] but has not worked for the last 6 years. He has 3 step children 1 adopted child and several grandchildren.  The patient states he's been depressed and anxious primarily since he stopped working 6 years ago. He's also been through several deaths in his family his brother died suddenly 4 years ago. Following that his sister-in-law died in his home 8 months after his brother died. The patient's wife was caring for his wife's mother and she died last month at the age of 1.  All of these things are disturbing to him. He's had trouble sleeping because he is thinking about the deaths. He's more depressed but not suicidal. He's not eating well and has lost about 20 pounds. He also has restless legs and chronic pain in his legs which keep him awake at night. He enjoys hunting and fishing but is less these interests recently.  The patient returns after 3 months. He states that his daughter is back back in jail after breaking probation. This time she is been ordered to go to a six-month drug rehabilitation program after the jail term. In the meantime he and his wife are caring for her 8-month-old son. He states that the boys at good baby and he enjoys having him. His mood has an good and he is sleeping well and eating fairly well. He denies any current symptoms of depression or anxiety and states that his medications have been helpful. Diagnosis:   Axis I: Anxiety Disorder NOS and Major Depression, Recurrent severe Axis II: Deferred Axis III:  Past Medical History:  Diagnosis Date  . Anxiety   . Anxiety and depression   . ASCVD (arteriosclerotic cardiovascular disease)    inferior MI with circumflex PTCA in 1993;cath in 2000-50% OM 1&2,30% LAD ;2008-25% LAD, 80% nondominant right ,60% small  OM 2,EF of 55% with severe basilar inferior hypokinesis; stress nuclear study in 04/2008 moderate inferior scar with peri-infarction ischemic  . Asthma   . CAD (coronary artery disease)   . Carcinoma in situ of colon 2004   rectal polyp  . Colitis, ischemic ([place]) 2011  . COPD (chronic obstructive pulmonary disease) (HCC)    mild ;excercise induced hypoxemia by cp stress test ;asthma ,bronchitis,  . Depression   . Diverticulosis   . Gastritis 12/30/10   EGD Dr [name]  . GERD (gastroesophageal reflux disease)   . H. pylori infection 2004   treated  . Hemorrhoids   . Hiatal hernia   . Hyperlipidemia   . Hypertension   . Inflammatory polyps of colon with rectal bleeding ([place])   .  Leukocytosis    [name]  . Lung nodule 07/16/2011  . Restless leg syndrome   . Schatzki's ring   . Sleep apnea    does not use CPAP  . Syncope   . Tobacco abuse    50 pack years continuing at one halp pack daily  . Tubular adenoma of colon 06/2009   Colonosocpy Dr [name]   Axis IV: other psychosocial or environmental problems Axis V: 41-50 serious symptoms  ADL's:  Intact  Sleep: Fair  Appetite:  Poor  Allergies: No Known Allergies Medical History: Past Medical History:  Diagnosis Date  . Anxiety   . Anxiety and depression   . ASCVD (arteriosclerotic cardiovascular disease)    inferior MI with circumflex PTCA in 1993;cath in 2000-50% OM 1&2,30% LAD ;2008-25% LAD, 80% nondominant right ,60% small OM 2,EF of 55% with severe basilar inferior hypokinesis; stress nuclear study in 04/2008 moderate inferior scar with peri-infarction ischemic  . Asthma   . CAD (coronary artery disease)   . Carcinoma in situ of colon 2004   rectal polyp  . Colitis, ischemic ([place]) 2011  . COPD (chronic obstructive pulmonary disease) (HCC)    mild ;excercise induced hypoxemia by cp stress test ;asthma ,bronchitis,  . Depression   . Diverticulosis   . Gastritis 12/30/10   EGD Dr [name]  . GERD (gastroesophageal reflux disease)   . H. pylori infection 2004   treated  . Hemorrhoids   . Hiatal hernia   . Hyperlipidemia   . Hypertension   . Inflammatory polyps of colon with rectal bleeding ([place])   . Leukocytosis    [name]  . Lung nodule 07/16/2011  . Restless leg syndrome   . Schatzki's ring   . Sleep apnea    does not use CPAP  . Syncope   . Tobacco abuse    50 pack years continuing at one halp pack daily  . Tubular adenoma of colon 06/2009   Colonosocpy Dr [name]   Surgical History: Past Surgical History:  Procedure Laterality Date  . CARDIAC CATHETERIZATION    . CHOLECYSTECTOMY  2004  . COLONOSCOPY  06/2009   normal terminal ileum, segmental mild inflammation of  sigmoid colon (bx unremarkable), polyp, tubular adenoma  . COLONOSCOPY N/A 07/31/2013   Dr.Rourk- redundant anal canal hemorrhoids, colonic diverticulosis, tubular adenoma  . COLONOSCOPY W/ POLYPECTOMY  2004   rectal polyp with carcinoma in situ removed via colonoscopy  . COLONOSCOPY WITH PROPOFOL N/A 09/15/2015   Procedure: COLONOSCOPY WITH PROPOFOL;  Surgeon: [name], MD;  [facility];  Service: Endoscopy;  Laterality: N/A;  1015  . CORONARY ANGIOPLASTY WITH STENT PLACEMENT  01/26/2012   "1; total is now 2"  .  ESOPHAGOGASTRODUODENOSCOPY  02/2009   query Barrett's but bx negative  . ESOPHAGOGASTRODUODENOSCOPY  12/30/2010   [name],gastritis, dilated 67F, sm HH, 1 small ulcer, Duodenal erosions, benign bx  . ESOPHAGOGASTRODUODENOSCOPY (EGD) WITH ESOPHAGEAL DILATION N/A 09/12/2012   [id number] Schatzki's ring s/p Maloney dilator. Small hiatal hernia. negative path  . ESOPHAGOGASTRODUODENOSCOPY (EGD) WITH ESOPHAGEAL DILATION N/A 07/31/2013   Dr. [name]- normal egd, s/p [facility] dilation empirically. Normal small bowel biopsies   . ESOPHAGOGASTRODUODENOSCOPY (EGD) WITH PROPOFOL N/A 09/15/2015   Procedure: ESOPHAGOGASTRODUODENOSCOPY (EGD) WITH PROPOFOL;  Surgeon: [name], MD;  [facility];  Service: Endoscopy;  Laterality: N/A;  . FLEXIBLE SIGMOIDOSCOPY  12/30/2010    [name],; internal hemorrhoids, anal papilla  . HAND SURGERY     surgical intervention for injury of the fingers of the left hand many years ago  . heart stent    . HEMORRHOID BANDING     Dr. [name]  . LEFT HEART CATHETERIZATION WITH CORONARY ANGIOGRAM N/A 01/26/2012   Procedure: LEFT HEART CATHETERIZATION WITH CORONARY ANGIOGRAM;  Surgeon: [name], MD;  Location: [facility] CATH LAB;  Service: Cardiovascular;  Laterality: N/A;  . [name] DILATION N/A 09/15/2015   Procedure: [name] DILATION;  Surgeon: [name], MD;  [facility];  Service: Endoscopy;  Laterality: N/A;  .  NASAL SEPTOPLASTY W/ TURBINOPLASTY  10/06/2011   Procedure: NASAL SEPTOPLASTY WITH TURBINATE REDUCTION;  Surgeon: Izora Gala, MD;  Location: [place];  Service: ENT;  Laterality: Bilateral;  . PERCUTANEOUS CORONARY STENT INTERVENTION (PCI-S) N/A 01/26/2012   Procedure: PERCUTANEOUS CORONARY STENT INTERVENTION (PCI-S);  Surgeon: [name], MD;  Location: [facility] CATH LAB;  Service: Cardiovascular;  Laterality: N/A;  . POLYPECTOMY  09/15/2015   Procedure: POLYPECTOMY;  Surgeon: [name], MD;  [facility];  Service: Endoscopy;;  Cecal polyp removed via cold snare/ Descending colon polyp removed via cold snare   Family History: family history includes Cancer in his maternal aunt and paternal uncle; Depression in his mother; Heart disease in his mother; Hypertension in his brother; Kidney failure in his maternal uncle; Lung disease in his father. Reviewed and nothing new today.  Suicidal Ideation:  Pt denies Homicidal Ideation:  Pt has some struggles with his daughter's boy friend.  He stays away from him. AEB (as evidenced by):per pt report  Psychiatric Specialty Exam: ROS Neuro: no headaches, ataxia, weakness, gives out easily GI: no N/V/cramps/constipation, he keeps diarrhea for the past 6 or 7 years MS: no weakness or aches. He catches cramps really bad.   Blood pressure 112/76, pulse 74, height 5\' 10"  (1.778 m), weight 164 lb 3.2 oz (74.5 kg), SpO2 92 %.Body mass index is 23.56 kg/m.  General Appearance: Casual  Eye Contact::  Fair  Speech:  Clear and Coherent  Volume:  Normal  Mood: Fairly good   Affect: Bright   Thought Process:  Coherent, Linear and Logical  Orientation:  Full (Time, Place, and Person)  Thought Content:  Within normal limits   Suicidal Thoughts:  no  Homicidal Thoughts:  No  Memory:  Immediate;   Fair Recent;   Fair Remote;   Fair  Judgement:  Fair  Insight:  Fair  Psychomotor Activity:  Normal  Concentration:  Fair  Recall:  Fair   Akathisia:  No  Handed:  Right  AIMS (if indicated):     Assets:  Communication Skills Desire for Improvement  Sleep:      Current Medications: Zyprexa 10 mg at  bed time Xanax 1 mg TID Neurontin 600 mg 3 times a day Prozac 40 mg per day Lab Results:  No results found for this or any previous visit (from the past 48 hour(s)). Marland Kitchen  Physical Findings: AIMS:  , ,  ,  ,    CIWA:    COWS:     Treatment Plan Summary: Medication management  Plan: I took his vitals.  I reviewed CC, tobacco/med/surg Hx, meds effects/ side effects, problem list, therapies and responses as well as current situation/symptoms discussed options. He will continue Prozac  60 mg daily, Zyprexa for mood stabilization and Neurontin and Xanax for anxiety He'll return to see me in 3 months but call if symptoms worsen before that.  See orders and pt instructions for more details.  MEDICATIONS this encounter: Meds ordered this encounter  Medications  . FLUoxetine (PROZAC) 40 MG capsule    Sig: Take 1 capsule (40 mg total) by mouth daily.    Dispense:  90 capsule    Refill:  2  . FLUoxetine (PROZAC) 20 MG capsule    Sig: Take 1 capsule (20 mg total) by mouth daily.    Dispense:  90 capsule    Refill:  2  . OLANZapine (ZYPREXA) 10 MG tablet    Sig: Take 1 tablet (10 mg total) by mouth at bedtime.    Dispense:  90 tablet    Refill:  2  . ALPRAZolam (XANAX) 1 MG tablet    Sig: Take 1 tablet (1 mg total) by mouth 4 (four) times daily.    Dispense:  120 tablet    Refill:  2    Medical Decision Making Problem Points:  Established problem, stable/improving (1), Review of last therapy session (1) and Review of psycho-social stressors (1) Data Points:  Review or order clinical lab tests (1) Review of medication regiment & side effects (2)  I certify that outpatient services furnished can reasonably be expected to improve the patient's condition.   [name] 11/19/2016, 11:45 AM   Patient ID: ELYON ZOLL, male   DOB: 1956-06-11, 61 y.o.   MRN: [id number]

## 2017-01-13 ENCOUNTER — Ambulatory Visit: Payer: Medicare Other | Admitting: Family Medicine

## 2017-01-18 ENCOUNTER — Other Ambulatory Visit: Payer: Self-pay | Admitting: Nurse Practitioner

## 2017-02-17 ENCOUNTER — Ambulatory Visit (INDEPENDENT_AMBULATORY_CARE_PROVIDER_SITE_OTHER): Payer: Medicare Other | Admitting: Family Medicine

## 2017-02-17 ENCOUNTER — Encounter: Payer: Self-pay | Admitting: Family Medicine

## 2017-02-17 VITALS — BP 116/78 | Ht 70.0 in | Wt 159.0 lb

## 2017-02-17 DIAGNOSIS — J438 Other emphysema: Secondary | ICD-10-CM

## 2017-02-17 DIAGNOSIS — K219 Gastro-esophageal reflux disease without esophagitis: Secondary | ICD-10-CM | POA: Diagnosis not present

## 2017-02-17 DIAGNOSIS — Z79891 Long term (current) use of opiate analgesic: Secondary | ICD-10-CM

## 2017-02-17 DIAGNOSIS — G2581 Restless legs syndrome: Secondary | ICD-10-CM | POA: Diagnosis not present

## 2017-02-17 DIAGNOSIS — G894 Chronic pain syndrome: Secondary | ICD-10-CM | POA: Diagnosis not present

## 2017-02-17 DIAGNOSIS — E784 Other hyperlipidemia: Secondary | ICD-10-CM

## 2017-02-17 DIAGNOSIS — E7849 Other hyperlipidemia: Secondary | ICD-10-CM

## 2017-02-17 MED ORDER — HYDROCODONE-ACETAMINOPHEN 7.5-325 MG PO TABS: 1.0000 | ORAL_TABLET | Freq: Two times a day (BID) | ORAL | 0 refills | Status: DC | PRN

## 2017-02-17 NOTE — Progress Notes (Signed)
   Subjective:    Patient ID: [name], male    DOB: 04/29/1956, 61 y.o.   MRN: [id number]  HPI  This patient was seen today for chronic pain  The medication list was reviewed and updated.   -Compliance with medication: yes  - Number patient states they take daily: 2 a day  -when was the last dose patient took? Took one this am  The patient was advised the importance of maintaining medication and not using illegal substances with these.  Refills needed: yes  The patient was educated that we can provide 3 monthly scripts for their medication, it is their responsibility to follow the instructions.  Side effects or complications from medications: none Patient states it does good job taking his medicine denies any problems states it does help him function Patient is aware that pain medications are meant to minimize the severity of the pain to allow their pain levels to improve to allow for better function. They are aware of that pain medications cannot totally remove their pain.  Due for UDT ( at least once per year) : today -02/17/17 Patient does have COPD he still smokes he uses his inhalers it does help keep it under control Has reflux issues if he takes his medicine does good job keeping under control without symptoms He states he does take his cholesterol medicine watches his diet he feels it's under good control He takes his restless leg medicine does good job helping him He feels that overall he is doing well He states depression under good control He's been trying to taper down the Xanax.      Review of Systems  Constitutional: Negative for activity change.  Gastrointestinal: Negative for abdominal pain and vomiting.  Neurological: Negative for weakness.  Psychiatric/Behavioral: Negative for confusion.       Objective:   Physical Exam  Constitutional: He appears well-nourished.  Cardiovascular: Normal rate, regular rhythm and normal heart sounds.   No murmur  heard. Pulmonary/Chest: Effort normal and breath sounds normal.  Musculoskeletal: He exhibits no edema.  Lymphadenopathy:    He has no cervical adenopathy.  Neurological: He is alert.  Psychiatric: His behavior is normal.  Vitals reviewed.   25 minutes was spent with the patient. Greater than half the time was spent in discussion and answering questions and counseling regarding the issues that the patient came in for today.       Assessment & Plan:  Pain control does well with medicine drug registry checked 3 prescriptions written, prescription contract discussed with the patient  COPD patient was encouraged to quit smoking he continues uses inhalers. Watch activity  Reflux under good control with medication continue this  Hyperlipidemia previous labs reviewed. No further labs necessary currently continue medication  Restless legs doing well with medication

## 2017-02-18 ENCOUNTER — Ambulatory Visit (HOSPITAL_COMMUNITY): Payer: Medicare Other | Admitting: Psychiatry

## 2017-02-21 ENCOUNTER — Other Ambulatory Visit: Payer: Self-pay | Admitting: Family Medicine

## 2017-02-22 LAB — TOXASSURE SELECT 13 (MW), URINE

## 2017-02-23 ENCOUNTER — Encounter (HOSPITAL_COMMUNITY): Payer: Self-pay | Admitting: Psychiatry

## 2017-02-23 ENCOUNTER — Ambulatory Visit (INDEPENDENT_AMBULATORY_CARE_PROVIDER_SITE_OTHER): Payer: Medicare Other | Admitting: Psychiatry

## 2017-02-23 VITALS — BP 113/75 | HR 66 | Ht 70.0 in | Wt 159.6 lb

## 2017-02-23 DIAGNOSIS — J449 Chronic obstructive pulmonary disease, unspecified: Secondary | ICD-10-CM | POA: Diagnosis not present

## 2017-02-23 DIAGNOSIS — F5105 Insomnia due to other mental disorder: Secondary | ICD-10-CM

## 2017-02-23 DIAGNOSIS — Z818 Family history of other mental and behavioral disorders: Secondary | ICD-10-CM

## 2017-02-23 DIAGNOSIS — F17200 Nicotine dependence, unspecified, uncomplicated: Secondary | ICD-10-CM | POA: Diagnosis not present

## 2017-02-23 DIAGNOSIS — Z634 Disappearance and death of family member: Secondary | ICD-10-CM

## 2017-02-23 DIAGNOSIS — F418 Other specified anxiety disorders: Secondary | ICD-10-CM

## 2017-02-23 DIAGNOSIS — Z736 Limitation of activities due to disability: Secondary | ICD-10-CM | POA: Diagnosis not present

## 2017-02-23 DIAGNOSIS — I2510 Atherosclerotic heart disease of native coronary artery without angina pectoris: Secondary | ICD-10-CM | POA: Diagnosis not present

## 2017-02-23 DIAGNOSIS — Z6379 Other stressful life events affecting family and household: Secondary | ICD-10-CM

## 2017-02-23 MED ORDER — FLUOXETINE HCL 40 MG PO CAPS: 40.0000 mg | ORAL_CAPSULE | Freq: Every day | ORAL | 2 refills | Status: DC

## 2017-02-23 MED ORDER — OLANZAPINE 10 MG PO TABS: 10.0000 mg | ORAL_TABLET | Freq: Every day | ORAL | 2 refills | Status: DC

## 2017-02-23 MED ORDER — GABAPENTIN 300 MG PO CAPS: 600.0000 mg | ORAL_CAPSULE | Freq: Three times a day (TID) | ORAL | 2 refills | Status: DC

## 2017-02-23 MED ORDER — FLUOXETINE HCL 20 MG PO CAPS: 20.0000 mg | ORAL_CAPSULE | Freq: Every day | ORAL | 2 refills | Status: DC

## 2017-02-23 MED ORDER — ALPRAZOLAM 1 MG PO TABS: 1.0000 mg | ORAL_TABLET | Freq: Four times a day (QID) | ORAL | 2 refills | Status: DC

## 2017-02-23 NOTE — Progress Notes (Signed)
Patient ID: [name], male   DOB: 10-Nov-1955, 61 y.o.   MRN: [id number] Patient ID: [name], male   DOB: January 13, 1956, 61 y.o.   MRN: [id number] Patient ID: [name], male   DOB: Feb 06, 1956, 61 y.o.   MRN: [id number] Patient ID: [name], male   DOB: Oct 21, 1955, 61 y.o.   MRN: [id number] Patient ID: [name], male   DOB: 09-Sep-1955, 61 y.o.   MRN: [id number] Patient ID: [name], male   DOB: May 27, 1956, 61 y.o.   MRN: [id number] Patient ID: [name], male   DOB: 02/26/1956, 61 y.o.   MRN: [id number] Patient ID: [name], male   DOB: 1956-04-02, 61 y.o.   MRN: [id number] Patient ID: [name], male   DOB: 01-27-1956, 61 y.o.   MRN: [id number] Patient ID: [name], male   DOB: 03-20-1956, 61 y.o.   MRN: [id number] Patient ID: [name], male   DOB: Aug 15, 1955, 61 y.o.   MRN: [id number] Patient ID: [name], male   DOB: 11-Jul-1955, 61 y.o.   MRN: [id number] Patient ID: [name], male   DOB: 12/23/1955, 61 y.o.   MRN: [id number] [place] [id number] Progress Note [name] MRN: [id number] DOB: 09/10/1955 Age: 61 y.o.  Date: 02/23/2017 Start Time: 9:50 AM End Time: 10:05 AM  Chief Complaint: Chief Complaint  Patient presents with  . Depression  . Anxiety  . Follow-up    Subjective: "I'm doing ok"  This patient is a 61 year old married white male lives with his wife in [place]. He is on disability for coronary artery disease and COPD. He used to work for the [employer] but has not worked for the last 6 years. He has 3 step children 1 adopted child and several grandchildren.  The patient states he's been depressed and anxious primarily since he stopped working 6 years ago. He's also been through several deaths in his family his brother died suddenly 4 years ago. Following that his sister-in-law died in his home 8 months after his brother died. The patient's wife was caring for his wife's mother and she died last month at the age of 42.  All of these things are disturbing to him. He's had trouble sleeping because he is thinking about the deaths. He's more depressed but not suicidal. He's not eating well and has lost about 20 pounds. He also has restless legs and chronic pain in his legs which keep him awake at night. He enjoys hunting and fishing but is less these interests recently.  The patient returns after 3 months. He states that his daughter is back back in jail after breaking probation. Apparently she got marijuana while she was out on a work program from her rehabilitation center. He and his wife are still taking care of her 24-month-old son. The patient has lost 5 more pounds and claims he is never hungry. He denies being depressed and is staying very active doing yard work and I encouraged him to eat more. He feels like his medications are still helpful for his depression and anxiety. He knows not to take Xanax and his pain medicine concurrently Diagnosis:   Axis I: Anxiety Disorder NOS and Major Depression, Recurrent severe Axis II: Deferred Axis III:  Past Medical History:  Diagnosis Date  . Anxiety   . Anxiety and depression   . ASCVD (arteriosclerotic cardiovascular disease)    inferior MI with circumflex PTCA in 1993;cath in 2000-50% OM  1&2,30% LAD ;2008-25% LAD, 80% nondominant right ,60% small OM 2,EF of 55% with severe basilar inferior hypokinesis; stress nuclear study in 04/2008 moderate inferior scar with peri-infarction ischemic  . Asthma   . CAD (coronary artery disease)   . Carcinoma in situ of colon 2004   rectal polyp  . Colitis, ischemic ([place]) 2011  . COPD (chronic obstructive pulmonary disease) (HCC)    mild ;excercise induced hypoxemia by cp stress test ;asthma ,bronchitis,  . Depression   . Diverticulosis   . Gastritis 12/30/10   EGD Dr [name]  . GERD (gastroesophageal reflux disease)   . H. pylori infection 2004   treated  . Hemorrhoids   . Hiatal hernia   . Hyperlipidemia   . Hypertension    . Inflammatory polyps of colon with rectal bleeding ([place])   . Leukocytosis    [name]  . Lung nodule 07/16/2011  . Restless leg syndrome   . Schatzki's ring   . Sleep apnea    does not use CPAP  . Syncope   . Tobacco abuse    50 pack years continuing at one halp pack daily  . Tubular adenoma of colon 06/2009   Colonosocpy Dr [name]   Axis IV: other psychosocial or environmental problems Axis V: 41-50 serious symptoms  ADL's:  Intact  Sleep: Fair  Appetite:  Poor  Allergies: No Known Allergies Medical History: Past Medical History:  Diagnosis Date  . Anxiety   . Anxiety and depression   . ASCVD (arteriosclerotic cardiovascular disease)    inferior MI with circumflex PTCA in 1993;cath in 2000-50% OM 1&2,30% LAD ;2008-25% LAD, 80% nondominant right ,60% small OM 2,EF of 55% with severe basilar inferior hypokinesis; stress nuclear study in 04/2008 moderate inferior scar with peri-infarction ischemic  . Asthma   . CAD (coronary artery disease)   . Carcinoma in situ of colon 2004   rectal polyp  . Colitis, ischemic ([place]) 2011  . COPD (chronic obstructive pulmonary disease) (HCC)    mild ;excercise induced hypoxemia by cp stress test ;asthma ,bronchitis,  . Depression   . Diverticulosis   . Gastritis 12/30/10   EGD Dr [name]  . GERD (gastroesophageal reflux disease)   . H. pylori infection 2004   treated  . Hemorrhoids   . Hiatal hernia   . Hyperlipidemia   . Hypertension   . Inflammatory polyps of colon with rectal bleeding ([place])   . Leukocytosis    [name]  . Lung nodule 07/16/2011  . Restless leg syndrome   . Schatzki's ring   . Sleep apnea    does not use CPAP  . Syncope   . Tobacco abuse    50 pack years continuing at one halp pack daily  . Tubular adenoma of colon 06/2009   Colonosocpy Dr [name]   Surgical History: Past Surgical History:  Procedure Laterality Date  . CARDIAC CATHETERIZATION    . CHOLECYSTECTOMY  2004  . COLONOSCOPY   06/2009   normal terminal ileum, segmental mild inflammation of sigmoid colon (bx unremarkable), polyp, tubular adenoma  . COLONOSCOPY N/A 07/31/2013   Dr.Rourk- redundant anal canal hemorrhoids, colonic diverticulosis, tubular adenoma  . COLONOSCOPY W/ POLYPECTOMY  2004   rectal polyp with carcinoma in situ removed via colonoscopy  . COLONOSCOPY WITH PROPOFOL N/A 09/15/2015   Procedure: COLONOSCOPY WITH PROPOFOL;  Surgeon: [name], MD;  [facility];  Service: Endoscopy;  Laterality: N/A;  1015  . CORONARY ANGIOPLASTY WITH STENT PLACEMENT  01/26/2012   "1; total is now 2"  . ESOPHAGOGASTRODUODENOSCOPY  02/2009   query Barrett's but bx negative  . ESOPHAGOGASTRODUODENOSCOPY  12/30/2010   [name],gastritis, dilated 68F, sm HH, 1 small ulcer, Duodenal erosions, benign bx  . ESOPHAGOGASTRODUODENOSCOPY (EGD) WITH ESOPHAGEAL DILATION N/A 09/12/2012   [id number] Schatzki's ring s/p Maloney dilator. Small hiatal hernia. negative path  . ESOPHAGOGASTRODUODENOSCOPY (EGD) WITH ESOPHAGEAL DILATION N/A 07/31/2013   Dr. [name]- normal egd, s/p [facility] dilation empirically. Normal small bowel biopsies   . ESOPHAGOGASTRODUODENOSCOPY (EGD) WITH PROPOFOL N/A 09/15/2015   Procedure: ESOPHAGOGASTRODUODENOSCOPY (EGD) WITH PROPOFOL;  Surgeon: [name], MD;  [facility];  Service: Endoscopy;  Laterality: N/A;  . FLEXIBLE SIGMOIDOSCOPY  12/30/2010    [name],; internal hemorrhoids, anal papilla  . HAND SURGERY     surgical intervention for injury of the fingers of the left hand many years ago  . heart stent    . HEMORRHOID BANDING     Dr. [name]  . LEFT HEART CATHETERIZATION WITH CORONARY ANGIOGRAM N/A 01/26/2012   Procedure: LEFT HEART CATHETERIZATION WITH CORONARY ANGIOGRAM;  Surgeon: [name], MD;  Location: [facility] CATH LAB;  Service: Cardiovascular;  Laterality: N/A;  . [name] DILATION N/A 09/15/2015   Procedure: [name] DILATION;  Surgeon: [name], MD;   [facility];  Service: Endoscopy;  Laterality: N/A;  . NASAL SEPTOPLASTY W/ TURBINOPLASTY  10/06/2011   Procedure: NASAL SEPTOPLASTY WITH TURBINATE REDUCTION;  Surgeon: Izora Gala, MD;  Location: [facility];  Service: ENT;  Laterality: Bilateral;  . PERCUTANEOUS CORONARY STENT INTERVENTION (PCI-S) N/A 01/26/2012   Procedure: PERCUTANEOUS CORONARY STENT INTERVENTION (PCI-S);  Surgeon: [name], MD;  Location: [facility] CATH LAB;  Service: Cardiovascular;  Laterality: N/A;  . POLYPECTOMY  09/15/2015   Procedure: POLYPECTOMY;  Surgeon: [name], MD;  [facility];  Service: Endoscopy;;  Cecal polyp removed via cold snare/ Descending colon polyp removed via cold snare   Family History: family history includes Cancer in his maternal aunt and paternal uncle; Depression in his mother; Heart disease in his mother; Hypertension in his brother; Kidney failure in his maternal uncle; Lung disease in his father. Reviewed and nothing new today.  Suicidal Ideation:  Pt denies Homicidal Ideation:  Pt has some struggles with his daughter's boy friend.  He stays away from him. AEB (as evidenced by):per pt report  Psychiatric Specialty Exam: ROS Neuro: no headaches, ataxia, weakness, gives out easily GI: no N/V/cramps/constipation, he keeps diarrhea for the past 6 or 7 years MS: no weakness or aches. He catches cramps really bad.   Blood pressure 113/75, pulse 66, height 5\' 10"  (1.778 m), weight 159 lb 9.6 oz (72.4 kg).Body mass index is 22.9 kg/m.  General Appearance: Casual  Eye Contact::  Fair  Speech:  Clear and Coherent  Volume:  Normal  Mood: Fairly good   Affect: BrightBut seems tired   Thought Process:  Coherent, Linear and Logical  Orientation:  Full (Time, Place, and Person)  Thought Content:  Within normal limits   Suicidal Thoughts:  no  Homicidal Thoughts:  No  Memory:  Immediate;   Fair Recent;   Fair Remote;   Fair  Judgement:  Fair  Insight:  Fair   Psychomotor Activity:  Normal  Concentration:  Fair  Recall:  Fair  Akathisia:  No  Handed:  Right  AIMS (if indicated):     Assets:  Communication Skills Desire for Improvement  Sleep:  Current Medications: Zyprexa 10 mg at bed time Xanax 1 mg TID Neurontin 600 mg 3 times a day Prozac 40 mg per day Lab Results:  No results found for this or any previous visit (from the past 48 hour(s)). Marland Kitchen  Physical Findings: AIMS:  , ,  ,  ,    CIWA:    COWS:     Treatment Plan Summary: Medication management  Plan: I took his vitals.  I reviewed CC, tobacco/med/surg Hx, meds effects/ side effects, problem list, therapies and responses as well as current situation/symptoms discussed options. He will continue Prozac  60 mg daily, Zyprexa for mood stabilization and Neurontin and Xanax for anxiety He'll return to see me in 3 months but call if symptoms worsen before that.  See orders and pt instructions for more details.  MEDICATIONS this encounter: Meds ordered this encounter  Medications  . FLUoxetine (PROZAC) 40 MG capsule    Sig: Take 1 capsule (40 mg total) by mouth daily.    Dispense:  90 capsule    Refill:  2  . FLUoxetine (PROZAC) 20 MG capsule    Sig: Take 1 capsule (20 mg total) by mouth daily.    Dispense:  90 capsule    Refill:  2  . OLANZapine (ZYPREXA) 10 MG tablet    Sig: Take 1 tablet (10 mg total) by mouth at bedtime.    Dispense:  90 tablet    Refill:  2  . gabapentin (NEURONTIN) 300 MG capsule    Sig: Take 2 capsules (600 mg total) by mouth 3 (three) times daily.    Dispense:  180 capsule    Refill:  2  . ALPRAZolam (XANAX) 1 MG tablet    Sig: Take 1 tablet (1 mg total) by mouth 4 (four) times daily.    Dispense:  120 tablet    Refill:  2    Medical Decision Making Problem Points:  Established problem, stable/improving (1), Review of last therapy session (1) and Review of psycho-social stressors (1) Data Points:  Review or order clinical lab tests  (1) Review of medication regiment & side effects (2)  I certify that outpatient services furnished can reasonably be expected to improve the patient's condition.   [name] 02/23/2017, 10:04 AM   Patient ID: TITO AUSMUS, male   DOB: 28-Aug-1955, 61 y.o.   MRN: [id number]

## 2017-03-17 ENCOUNTER — Telehealth (HOSPITAL_COMMUNITY): Payer: Self-pay | Admitting: *Deleted

## 2017-03-17 NOTE — Telephone Encounter (Signed)
Office received fax from pt pharmacy [facility] requesting 90 days supply for pt Gabapentin 300 mg 2 caps TID. Pt medication was last filled by Dr. Harrington Challenger on 02-23-2017 with 180 caps 2 refills. Pt pharmacy number is [phone].

## 2017-03-17 NOTE — Telephone Encounter (Signed)
He should have enough medication

## 2017-03-23 NOTE — Telephone Encounter (Signed)
noted 

## 2017-04-14 ENCOUNTER — Telehealth: Payer: Self-pay | Admitting: *Deleted

## 2017-04-14 NOTE — Telephone Encounter (Signed)
Left message to return call 

## 2017-04-14 NOTE — Telephone Encounter (Signed)
Pt states he does not take and does not want it. Fax sent back with a note denying rx.

## 2017-04-14 NOTE — Telephone Encounter (Signed)
[facility] refill on omega 3 acid ethyl esters 2 capsules bid #[phone]. Called pt because this was not on med list. Left message to return call.

## 2017-05-19 ENCOUNTER — Encounter: Payer: Self-pay | Admitting: Family Medicine

## 2017-05-19 ENCOUNTER — Ambulatory Visit: Payer: Medicare Other | Admitting: Family Medicine

## 2017-05-19 VITALS — BP 134/86 | Ht 70.0 in | Wt 167.0 lb

## 2017-05-19 DIAGNOSIS — Z23 Encounter for immunization: Secondary | ICD-10-CM | POA: Diagnosis not present

## 2017-05-19 DIAGNOSIS — Z79899 Other long term (current) drug therapy: Secondary | ICD-10-CM | POA: Diagnosis not present

## 2017-05-19 DIAGNOSIS — Z79891 Long term (current) use of opiate analgesic: Secondary | ICD-10-CM

## 2017-05-19 DIAGNOSIS — Z1159 Encounter for screening for other viral diseases: Secondary | ICD-10-CM | POA: Diagnosis not present

## 2017-05-19 DIAGNOSIS — E785 Hyperlipidemia, unspecified: Secondary | ICD-10-CM

## 2017-05-19 DIAGNOSIS — I10 Essential (primary) hypertension: Secondary | ICD-10-CM

## 2017-05-19 DIAGNOSIS — Z125 Encounter for screening for malignant neoplasm of prostate: Secondary | ICD-10-CM | POA: Diagnosis not present

## 2017-05-19 MED ORDER — HYDROCODONE-ACETAMINOPHEN 7.5-325 MG PO TABS: 1.0000 | ORAL_TABLET | Freq: Two times a day (BID) | ORAL | 0 refills | Status: DC | PRN

## 2017-05-19 NOTE — Progress Notes (Signed)
Subjective:    Patient ID: [name], male    DOB: Jun 17, 1955, 61 y.o.   MRN: [id number]  HPI This patient was seen today for chronic pain. Takes for leg pain  The medication list was reviewed and updated.   -Compliance with medication: yes  - Number patient states they take daily: 2  -when was the last dose patient took? yesterday  The patient was advised the importance of maintaining medication and not using illegal substances with these.  Refills needed: yes  The patient was educated that we can provide 3 monthly scripts for their medication, it is their responsibility to follow the instructions.  Side effects or complications from medications:   Patient is aware that pain medications are meant to minimize the severity of the pain to allow their pain levels to improve to allow for better function. They are aware of that pain medications cannot totally remove their pain.  Due for UDT ( at least once per year) : last one 02/17/17  Flu vaccine today.   Patient states he is doing well with taking all of his medicines states breathing is hanging in there.  Denies any chest pressure or pain.    Review of Systems  Constitutional: Negative for activity change.  Gastrointestinal: Negative for abdominal pain and vomiting.  Neurological: Negative for weakness.  Psychiatric/Behavioral: Negative for confusion.       Objective:   Physical Exam  Constitutional: He appears well-nourished.  Cardiovascular: Normal rate, regular rhythm and normal heart sounds.  No murmur heard. Pulmonary/Chest: Effort normal and breath sounds normal.  Musculoskeletal: He exhibits no edema.  Lymphadenopathy:    He has no cervical adenopathy.  Neurological: He is alert.  Psychiatric: His behavior is normal.  Vitals reviewed.         Assessment & Plan:  The patient was seen today as part of a comprehensive visit regarding pain control. Patient's compliance with the medication as well as  discussion regarding effectiveness was completed. Prescriptions were written. Patient was advised to follow-up in 3 months. The patient was assessed for any signs of severe side effects. The patient was advised to take the medicine as directed and to report to [place] if any side effect issues.  HTN- Patient was seen today as part of a visit regarding hypertension. The importance of healthy diet and regular physical activity was discussed. The importance of compliance with medications discussed. Ideal goal is to keep blood pressure low elevated levels certainly below 443/15 when possible. The patient was counseled that keeping blood pressure under control lessen his risk of heart attack, stroke, kidney failure, and early death. The importance of regular follow-ups was discussed with the patient. Low-salt diet such as DASH recommended. Regular physical activity was recommended as well. Patient was advised to keep regular follow-ups.  The patient was seen today as part of an evaluation regarding hyperlipidemia. Recent lab work has been reviewed with the patient as well as the goals for good cholesterol care. In addition to this medications have been discussed the importance of compliance with diet and medications discussed as well. Patient has been informed of potential side effects of medications in the importance to notify us should any problems occur. Finally the patient is aware that poor control of cholesterol, noncompliance can dramatically increase her risk of heart attack strokes and premature death. The patient will keep regular office visits and the patient does agreed to periodic lab work.  Overall patient is doing well we will continue  current measures.  Comprehensive lab work before next visit  Follow-up in 3 months

## 2017-05-25 ENCOUNTER — Ambulatory Visit (HOSPITAL_COMMUNITY): Payer: Medicare Other | Admitting: Psychiatry

## 2017-05-26 ENCOUNTER — Other Ambulatory Visit: Payer: Self-pay | Admitting: Family Medicine

## 2017-05-30 ENCOUNTER — Other Ambulatory Visit: Payer: Self-pay

## 2017-05-30 MED ORDER — ATORVASTATIN CALCIUM 80 MG PO TABS: 80.0000 mg | ORAL_TABLET | Freq: Every day | ORAL | 3 refills | Status: DC

## 2017-06-26 ENCOUNTER — Other Ambulatory Visit (HOSPITAL_COMMUNITY): Payer: Self-pay | Admitting: Psychiatry

## 2017-07-18 ENCOUNTER — Other Ambulatory Visit (HOSPITAL_COMMUNITY): Payer: Self-pay | Admitting: Psychiatry

## 2017-07-18 ENCOUNTER — Other Ambulatory Visit: Payer: Self-pay | Admitting: Family Medicine

## 2017-07-18 DIAGNOSIS — F418 Other specified anxiety disorders: Secondary | ICD-10-CM

## 2017-07-18 DIAGNOSIS — F5105 Insomnia due to other mental disorder: Secondary | ICD-10-CM

## 2017-07-18 DIAGNOSIS — F17200 Nicotine dependence, unspecified, uncomplicated: Secondary | ICD-10-CM

## 2017-08-17 ENCOUNTER — Encounter: Payer: Self-pay | Admitting: Family Medicine

## 2017-08-17 ENCOUNTER — Ambulatory Visit: Payer: Medicare Other | Admitting: Family Medicine

## 2017-08-17 VITALS — BP 120/80 | Ht 70.0 in | Wt 167.0 lb

## 2017-08-17 DIAGNOSIS — K625 Hemorrhage of anus and rectum: Secondary | ICD-10-CM

## 2017-08-17 DIAGNOSIS — R5383 Other fatigue: Secondary | ICD-10-CM

## 2017-08-17 DIAGNOSIS — J438 Other emphysema: Secondary | ICD-10-CM | POA: Diagnosis not present

## 2017-08-17 DIAGNOSIS — I10 Essential (primary) hypertension: Secondary | ICD-10-CM | POA: Diagnosis not present

## 2017-08-17 DIAGNOSIS — F5105 Insomnia due to other mental disorder: Secondary | ICD-10-CM

## 2017-08-17 DIAGNOSIS — Z1211 Encounter for screening for malignant neoplasm of colon: Secondary | ICD-10-CM | POA: Diagnosis not present

## 2017-08-17 DIAGNOSIS — G894 Chronic pain syndrome: Secondary | ICD-10-CM

## 2017-08-17 DIAGNOSIS — F418 Other specified anxiety disorders: Secondary | ICD-10-CM | POA: Diagnosis not present

## 2017-08-17 DIAGNOSIS — R0789 Other chest pain: Secondary | ICD-10-CM

## 2017-08-17 DIAGNOSIS — E785 Hyperlipidemia, unspecified: Secondary | ICD-10-CM

## 2017-08-17 LAB — POCT HEMOGLOBIN: HEMOGLOBIN: 16.7 g/dL (ref 14.1–18.1)

## 2017-08-17 MED ORDER — HYDROCODONE-ACETAMINOPHEN 7.5-325 MG PO TABS: 1.0000 | ORAL_TABLET | Freq: Two times a day (BID) | ORAL | 0 refills | Status: DC | PRN

## 2017-08-17 NOTE — Progress Notes (Addendum)
Subjective:    Patient ID: [name], male    DOB: [birth year], 62 y.o.   MRN: [id number]  HPI This patient was seen today for chronic pain  The medication list was reviewed and updated.   -Compliance with medication: Yes  - Number patient states they take daily: 2 times daily  -when was the last dose patient took? This am around 6:30 am  The patient was advised the importance of maintaining medication and not using illegal substances with these.  Here for refills and follow up  The patient was educated that we can provide 3 monthly scripts for their medication, it is their responsibility to follow the instructions.  Side effects or complications from medications: None  Patient is aware that pain medications are meant to minimize the severity of the pain to allow their pain levels to improve to allow for better function. They are aware of that pain medications cannot totally remove their pain.  Due for UDT ( at least once per year) : 02/2018 Patient is going through a very difficult time his daughter hung herself near Christmas time this is because significant grief and sadness patient denies being suicidal  Patient does have ongoing trouble with reflux.  Takes medication on a regular basis.  Tries to minimize foods as best they can.  They understand the importance of dietary compliance.  May also try to avoid eating a large meal close to bedtime.  Patient denies any dysphagia denies hematochezia.  States medicine does a good job keeping the problem under good control.  Without the medication may certainly have issues.They desire to continue taking their medication.  Patient does use tobacco products.  Patient knows they should quit.  Patient is aware that smoking/in use of tobacco products increases their risk of heart disease, cancer, and COPD- lung issues.  Patient has been counseled to quit smoking/tobacco products.  Patient for blood pressure check up. Patient relates  compliance with meds. Todays BP reviewed with the patient. Patient denies issues with medication. Patient relates reasonable diet. Patient tries to minimize salt. Patient aware of BP goals.  Patient here for follow-up regarding cholesterol.  Patient does try to maintain a reasonable diet.  Patient does take the medication on a regular basis.  Denies missing a dose.  The patient denies any obvious side effects.  Prior blood work results reviewed with the patient.  The patient is aware of his cholesterol goals and the need to keep it under good control to lessen the risk of disease.   Drug registry was checked patient get psychiatric care through  The new EKG was done today because patient reported having some slight chest discomfort at rest a few days ago denied any chest pressure the EKG itself does not show any acute changes it is normal.  This was compared to the most previous EKG located in the electronic medical record  Psychiatry-they follow him on a regular basis I encouraged the patient to do a follow-up visit to discuss the trauma he went through with his daughter committing suicide  Patient had chest tightness pressure pain discomfort that occurred several weeks ago he thinks it was related to the stress of what is going on with the loss of his daughter it occurred at rest that lasted for 15 minutes it radiated up toward his neck did not go down his arm he has not had any symptoms    Results for orders placed or performed in visit on 08/17/17  POCT  hemoglobin  Result Value Ref Range   Hemoglobin 16.7 14.1 - 18.1 g/dL     Review of Systems  Constitutional: Negative for activity change, appetite change and fatigue.  HENT: Negative for congestion and rhinorrhea.   Respiratory: Negative for cough, chest tightness and shortness of breath.   Cardiovascular: Negative for chest pain and leg swelling.  Gastrointestinal: Negative for abdominal pain, diarrhea and nausea.  Endocrine: Negative  for polydipsia and polyphagia.  Genitourinary: Negative for dysuria and hematuria.  Neurological: Negative for weakness and headaches.  Psychiatric/Behavioral: Negative for confusion and dysphoric mood.       Objective:   Physical Exam  Constitutional: He appears well-nourished. No distress.  HENT:  Head: Normocephalic and atraumatic.  Eyes: Right eye exhibits no discharge. Left eye exhibits no discharge.  Neck: No tracheal deviation present.  Cardiovascular: Normal rate, regular rhythm and normal heart sounds.  No murmur heard. Pulmonary/Chest: Effort normal and breath sounds normal. No respiratory distress. He has no wheezes.  Musculoskeletal: He exhibits no edema.  Lymphadenopathy:    He has no cervical adenopathy.  Neurological: He is alert.  Skin: Skin is warm. No rash noted.  Psychiatric: His behavior is normal.  Vitals reviewed.         Assessment & Plan:  1. Essential hypertension Blood pressure fair control continue medication watch diet quit smoking  2. Other emphysema ([place]) Use inhalers quit smoking  3. Hyperlipidemia, unspecified hyperlipidemia type Continue Lipitor watch diet quit smoking recent lab work looked good  4. Chronic pain syndrome The patient was seen today as part of a comprehensive visit regarding pain control. Patient's compliance with the medication as well as discussion regarding effectiveness was completed. Prescriptions were written. Patient was advised to follow-up in 3 months. The patient was assessed for any signs of severe side effects. The patient was advised to take the medicine as directed and to report to [place] if any side effect issues. Drug registry was checked 3 prescriptions given patient is on chronic Xanax therefore I would not recommend increasing the pain medicine the patient has severe anxiety related issues and cannot do without his Xanax  5. 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 anxiety Counseling recommended to help him  through the grief continue antidepressant patient denies being suicidal we will send notification to his psychiatrist asking them to counsel him  6. Other chest pain This EKG was compared to the most recent previous EKG available in the electronic records.  (Please see previous EKGs most recent EKG under the EKG heading.) No acute changes no sign of ischemia - EKG 12-Lead - Ambulatory referral to Cardiology  7. Fatigue, unspecified type Patient having some rectal bleeding we will check hemoglobin - POCT hemoglobin  8. Colon cancer screening Patient had colonoscopy 2 years ago also had rectal bleeding referral to gastroenterology - Ambulatory referral to Gastroenterology

## 2017-08-24 ENCOUNTER — Encounter: Payer: Self-pay | Admitting: Family Medicine

## 2017-08-25 ENCOUNTER — Encounter (HOSPITAL_COMMUNITY): Payer: Self-pay | Admitting: Psychiatry

## 2017-08-25 ENCOUNTER — Ambulatory Visit (HOSPITAL_COMMUNITY): Payer: Medicare Other | Admitting: Psychiatry

## 2017-08-25 DIAGNOSIS — F5105 Insomnia due to other mental disorder: Secondary | ICD-10-CM

## 2017-08-25 DIAGNOSIS — F418 Other specified anxiety disorders: Secondary | ICD-10-CM | POA: Diagnosis not present

## 2017-08-25 DIAGNOSIS — F17210 Nicotine dependence, cigarettes, uncomplicated: Secondary | ICD-10-CM

## 2017-08-25 DIAGNOSIS — Z736 Limitation of activities due to disability: Secondary | ICD-10-CM

## 2017-08-25 DIAGNOSIS — I2510 Atherosclerotic heart disease of native coronary artery without angina pectoris: Secondary | ICD-10-CM

## 2017-08-25 DIAGNOSIS — F17200 Nicotine dependence, unspecified, uncomplicated: Secondary | ICD-10-CM | POA: Diagnosis not present

## 2017-08-25 DIAGNOSIS — Z634 Disappearance and death of family member: Secondary | ICD-10-CM

## 2017-08-25 MED ORDER — GABAPENTIN 300 MG PO CAPS
600.0000 mg | ORAL_CAPSULE | Freq: Three times a day (TID) | ORAL | 2 refills | Status: DC
Start: 2017-08-25 — End: 2017-11-18

## 2017-08-25 MED ORDER — ALPRAZOLAM 1 MG PO TABS
1.0000 mg | ORAL_TABLET | Freq: Four times a day (QID) | ORAL | 2 refills | Status: DC
Start: 2017-08-25 — End: 2017-10-06

## 2017-08-25 MED ORDER — FLUOXETINE HCL 20 MG PO CAPS: 20.0000 mg | ORAL_CAPSULE | Freq: Every day | ORAL | 2 refills | Status: DC

## 2017-08-25 MED ORDER — TEMAZEPAM 30 MG PO CAPS: 30.0000 mg | ORAL_CAPSULE | Freq: Every evening | ORAL | 2 refills | Status: DC | PRN

## 2017-08-25 MED ORDER — FLUOXETINE HCL 40 MG PO CAPS: 40.0000 mg | ORAL_CAPSULE | Freq: Every day | ORAL | 2 refills | Status: DC

## 2017-08-25 MED ORDER — OLANZAPINE 10 MG PO TABS: 10.0000 mg | ORAL_TABLET | Freq: Every day | ORAL | 2 refills | Status: DC

## 2017-08-25 NOTE — Progress Notes (Signed)
[facility] MD/PA/NP OP Progress Note  08/25/2017 10:18 AM COEN MIYASATO  MRN:  [id number]  Chief Complaint:  Chief Complaint    Depression; Anxiety; Follow-up     HPI: This patient is a 62 year old married white male lives with his wife in [place]. He is on disability for coronary artery disease and COPD. He used to work for the [employer] but has not worked for the last 6 years. He has 3 step children 1 adopted child and several grandchildren.  The patient states he's been depressed and anxious primarily since he stopped working 6 years ago. He's also been through several deaths in his family his brother died suddenly 4 years ago. Following that his sister-in-law died in his home 8 months after his brother died. The patient's wife was caring for his wife's mother and she died last month at the age of 106. All of these things are disturbing to him. He's had trouble sleeping because he is thinking about the deaths. He's more depressed but not suicidal. He's not eating well and has lost about 20 pounds. He also has restless legs and chronic pain in his legs which keep him awake at night. He enjoys hunting and fishing but is less these interests recently.  Patient returns after 6 months.  He has missed some appointments.  He was last seen last September.  He states that in December his youngest daughter asked to come back home.  She has been intermittently in and out of the home due to her substance abuse issues and recent incarcerations.  He and his wife has been caring for her 71-year-old son and her 59-year-old daughter was in the custody of the father.  This daughter apparently was still abusing drugs and admitted while home that she was still using methamphetamine and dating a man who was using and had recently been incarcerated.  She did not get to see her daughter on Christmas day and later that night the patient found her in the backyard where she had hung herself from post.  Since then he has had a really rough  time.  He keeps seeing images of her body and sometimes has nightmares about it.  He is not been able to sleep very much.  His appetite is poor but he is trying to eat anyway.  He states that having his grandson with him is the only thing that is keeping him going.  He is not suicidal and he is trying to spend time with other family members.  He has stayed on all his medications but I suggested we add something to help him sleep and he agrees.  I suggested counseling here but he declined Visit Diagnosis:    ICD-10-CM   1. Depression with anxiety F41.8 gabapentin (NEURONTIN) 300 MG capsule  2. 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 TOBACCO ABUSE F17.200 gabapentin (NEURONTIN) 300 MG capsule    Past Psychiatric History: none  Past Medical History:  Past Medical History:  Diagnosis Date  . Anxiety   . Anxiety and depression   . ASCVD (arteriosclerotic cardiovascular disease)    inferior MI with circumflex PTCA in 1993;cath in 2000-50% OM 1&2,30% LAD ;2008-25% LAD, 80% nondominant right ,60% small OM 2,EF of 55% with severe basilar inferior hypokinesis; stress nuclear study in 04/2008 moderate inferior scar with peri-infarction ischemic  . Asthma   . CAD (coronary artery disease)   . Carcinoma in situ of colon 2004  rectal polyp  . Colitis, ischemic ([place]) 2011  . COPD (chronic obstructive pulmonary disease) (HCC)    mild ;excercise induced hypoxemia by cp stress test ;asthma ,bronchitis,  . Depression   . Diverticulosis   . Gastritis 12/30/10   EGD Dr [name]  . GERD (gastroesophageal reflux disease)   . H. pylori infection 2004   treated  . Hemorrhoids   . Hiatal hernia   . Hyperlipidemia   . Hypertension   . Inflammatory polyps of colon with rectal bleeding ([place])   . Leukocytosis    [name]  . Lung nodule 07/16/2011  . Restless leg syndrome   . Schatzki's ring   . Sleep apnea    does not use CPAP  .  Syncope   . Tobacco abuse    50 pack years continuing at one halp pack daily  . Tubular adenoma of colon 06/2009   Colonosocpy Dr [name]    Past Surgical History:  Procedure Laterality Date  . CARDIAC CATHETERIZATION    . CHOLECYSTECTOMY  2004  . COLONOSCOPY  06/2009   normal terminal ileum, segmental mild inflammation of sigmoid colon (bx unremarkable), polyp, tubular adenoma  . COLONOSCOPY N/A 07/31/2013   Dr.Rourk- redundant anal canal hemorrhoids, colonic diverticulosis, tubular adenoma  . COLONOSCOPY W/ POLYPECTOMY  2004   rectal polyp with carcinoma in situ removed via colonoscopy  . COLONOSCOPY WITH PROPOFOL N/A 09/15/2015   Procedure: COLONOSCOPY WITH PROPOFOL;  Surgeon: [name], MD;  [facility];  Service: Endoscopy;  Laterality: N/A;  1015  . CORONARY ANGIOPLASTY WITH STENT PLACEMENT  01/26/2012   "1; total is now 2"  . ESOPHAGOGASTRODUODENOSCOPY  02/2009   query Barrett's but bx negative  . ESOPHAGOGASTRODUODENOSCOPY  12/30/2010   [name],gastritis, dilated 74F, sm HH, 1 small ulcer, Duodenal erosions, benign bx  . ESOPHAGOGASTRODUODENOSCOPY (EGD) WITH ESOPHAGEAL DILATION N/A 09/12/2012   JXB:JYNWGNFAOZH Schatzki's ring s/p Maloney dilator. Small hiatal hernia. negative path  . ESOPHAGOGASTRODUODENOSCOPY (EGD) WITH ESOPHAGEAL DILATION N/A 07/31/2013   Dr. [name]- normal egd, s/p [facility] dilation empirically. Normal small bowel biopsies   . ESOPHAGOGASTRODUODENOSCOPY (EGD) WITH PROPOFOL N/A 09/15/2015   Procedure: ESOPHAGOGASTRODUODENOSCOPY (EGD) WITH PROPOFOL;  Surgeon: [name], MD;  [facility];  Service: Endoscopy;  Laterality: N/A;  . FLEXIBLE SIGMOIDOSCOPY  12/30/2010    [name],; internal hemorrhoids, anal papilla  . HAND SURGERY     surgical intervention for injury of the fingers of the left hand many years ago  . heart stent    . HEMORRHOID BANDING     Dr. [name]  . LEFT HEART CATHETERIZATION WITH CORONARY ANGIOGRAM N/A  01/26/2012   Procedure: LEFT HEART CATHETERIZATION WITH CORONARY ANGIOGRAM;  Surgeon: [name], MD;  Location[facility] CATH LAB;  Service: Cardiovascular;  Laterality: N/A;  . [name] DILATION N/A 09/15/2015   Procedure: [name] DILATION;  Surgeon: [name], MD;  [facility];  Service: Endoscopy;  Laterality: N/A;  . NASAL SEPTOPLASTY W/ TURBINOPLASTY  10/06/2011   Procedure: NASAL SEPTOPLASTY WITH TURBINATE REDUCTION;  Surgeon: Izora Gala, MD;  Location: [facility];  Service: ENT;  Laterality: Bilateral;  . PERCUTANEOUS CORONARY STENT INTERVENTION (PCI-S) N/A 01/26/2012   Procedure: PERCUTANEOUS CORONARY STENT INTERVENTION (PCI-S);  Surgeon: [name], MD;  Location: [facility] CATH LAB;  Service: Cardiovascular;  Laterality: N/A;  . POLYPECTOMY  09/15/2015   Procedure: POLYPECTOMY;  Surgeon: [name], MD;  [facility];  Service: Endoscopy;;  Cecal  polyp removed via cold snare/ Descending colon polyp removed via cold snare    Family Psychiatric History: See below  Family History:  Family History  Problem Relation Age of Onset  . Lung disease Father        deceased, black lung  . Heart disease Mother        blood clots  . Depression Mother   . Cancer Paternal Uncle        unknown type  . Cancer Maternal Aunt        unknown type  . Kidney failure Maternal Uncle   . Hypertension Brother   . Colon cancer Neg Hx   . ADD / ADHD Neg Hx   . Alcohol abuse Neg Hx   . Drug abuse Neg Hx   . Anxiety disorder Neg Hx   . Bipolar disorder Neg Hx   . Dementia Neg Hx   . OCD Neg Hx   . Paranoid behavior Neg Hx   . Schizophrenia Neg Hx   . Physical abuse Neg Hx   . Sexual abuse Neg Hx   . Seizures Neg Hx     Social History:  Social History   Socioeconomic History  . Marital status: Married    Spouse name: None  . Number of children: 3  . Years of education: None  . Highest education level: None  Social Needs  . Financial resource strain: None  . Food  insecurity - worry: None  . Food insecurity - inability: None  . Transportation needs - medical: None  . Transportation needs - non-medical: None  Occupational History  . Occupation: disable    Employer: RETIRED    Comment: [employer]  Tobacco Use  . Smoking status: Current Some Day Smoker    Packs/day: 0.50    Years: 30.00    Pack years: 15.00    Types: Cigarettes  . Smokeless tobacco: Former Systems developer  . Tobacco comment: cutting back trying to quit  Substance and Sexual Activity  . Alcohol use: No    Comment: Drinks a beer occasionally  . Drug use: No  . Sexual activity: Not Currently  Other Topics Concern  . None  Social History Narrative   3 stepchildren    Allergies: No Known Allergies  Metabolic Disorder Labs: Lab Results  Component Value Date   HGBA1C 5.3 07/24/2015   MPG 103 10/29/2013   MPG 97 04/30/2013   No results found for: PROLACTIN Lab Results  Component Value Date   CHOL 128 10/01/2016   TRIG 131 10/01/2016   HDL 34 (L) 10/01/2016   CHOLHDL 3.8 10/01/2016   VLDL 64 (H) 06/20/2014   LDLCALC 68 10/01/2016   LDLCALC 60 01/22/2016   Lab Results  Component Value Date   TSH 1.011 02/09/2010    Therapeutic Level Labs: No results found for: LITHIUM No results found for: VALPROATE No components found for:  CBMZ  Current Medications: Current Outpatient Medications  Medication Sig Dispense Refill  . albuterol (PROVENTIL) (2.5 MG/3ML) 0.083% nebulizer solution Take 2.5 mg by nebulization every 6 (six) hours as needed. For shortness of breath    . ALPRAZolam (XANAX) 1 MG tablet Take 1 tablet (1 mg total) by mouth 4 (four) times daily. 120 tablet 2  . aspirin EC 81 MG tablet Take 81 mg by mouth daily.     Marland Kitchen atorvastatin (LIPITOR) 80 MG tablet Take 1 tablet (80 mg total) by mouth daily. 90 tablet 3  . budesonide-formoterol (SYMBICORT) 80-4.5 MCG/ACT inhaler Inhale  2 puffs into the lungs 2 (two) times daily. 1 Inhaler 12  . FLUoxetine (PROZAC) 20 MG capsule Take 1  capsule (20 mg total) by mouth daily. 90 capsule 2  . FLUoxetine (PROZAC) 40 MG capsule Take 1 capsule (40 mg total) by mouth daily. 90 capsule 2  . fluticasone (FLONASE) 50 MCG/ACT nasal spray Place 2 sprays into both nostrils daily. 16 g 5  . gabapentin (NEURONTIN) 300 MG capsule Take 2 capsules (600 mg total) by mouth 3 (three) times daily. 180 capsule 2  . HYDROcodone-acetaminophen (NORCO) 7.5-325 MG tablet Take 1 tablet by mouth 2 (two) times daily as needed for moderate pain. 60 tablet 0  . hydrocortisone (ANUSOL-HC) 2.5 % rectal cream Place 1 application rectally as needed. Reported on 10/21/2015    . lisinopril (PRINIVIL,ZESTRIL) 2.5 MG tablet TAKE 1 TABLET (2.5 MG TOTAL) BY MOUTH DAILY. **STOP VERAPAMIL** 30 tablet 5  . metoprolol succinate (TOPROL-XL) 25 MG 24 hr tablet TAKE 1 TABLET BY MOUTH EVERY DAY 90 tablet 1  . nitroGLYCERIN (NITROSTAT) 0.4 MG SL tablet Place 0.4 mg under the tongue every 5 (five) minutes as needed for chest pain.    Marland Kitchen OLANZapine (ZYPREXA) 10 MG tablet Take 1 tablet (10 mg total) by mouth at bedtime. 90 tablet 2  . pantoprazole (PROTONIX) 40 MG tablet TAKE 1 TABLET BY MOUTH TWICE A DAY *INS WILL PAY 09/21/15* 180 tablet 1  . promethazine (PHENERGAN) 12.5 MG tablet Take 12.5 mg by mouth every 6 (six) hours as needed for nausea or vomiting. Reported on 10/21/2015    . rOPINIRole (REQUIP) 2 MG tablet TAKE 1 TABLET (2 MG TOTAL) BY MOUTH AT BEDTIME. 90 tablet 2  . vitamin B-12 (CYANOCOBALAMIN) 1000 MCG tablet Take 2,000 mcg by mouth daily.    . temazepam (RESTORIL) 30 MG capsule Take 1 capsule (30 mg total) by mouth at bedtime as needed for sleep. 30 capsule 2   No current facility-administered medications for this visit.      Musculoskeletal: Strength & Muscle Tone: within normal limits Gait & Station: normal Patient leans: N/A  Psychiatric Specialty Exam: Review of Systems  Constitutional: Positive for malaise/fatigue.  Gastrointestinal: Positive for blood in  stool.  Psychiatric/Behavioral: Positive for depression. The patient has insomnia.   All other systems reviewed and are negative.   Blood pressure (!) 146/82, pulse 74, height 5\' 10"  (1.778 m), weight 164 lb (74.4 kg), SpO2 99 %.Body mass index is 23.53 kg/m.  General Appearance: Casual and Fairly Groomed  Eye Contact:  Good  Speech:  Clear and Coherent  Volume:  Normal  Mood:  Dysphoric  Affect:  Constricted  Thought Process:  Goal Directed  Orientation:  Full (Time, Place, and Person)  Thought Content: Rumination   Suicidal Thoughts:  No  Homicidal Thoughts:  No  Memory:  Immediate;   Good Recent;   Good Remote;   Good  Judgement:  Good  Insight:  Good  Psychomotor Activity:  Decreased  Concentration:  Concentration: Fair and Attention Span: Fair  Recall:  Good  Fund of Knowledge: Good  Language: Good  Akathisia:  No  Handed:  Right  AIMS (if indicated): not done  Assets:  Communication Skills Desire for Improvement Resilience Social Support Talents/Skills  ADL's:  Intact  Cognition: WNL  Sleep:  Poor   Screenings: PHQ2-9     Office Visit from 05/19/2017 in [facility] Office Visit from 10/21/2015 in [facility] Visit from 04/23/2015 in [place]  PHQ-2 Total Score  0  0  0       Assessment and Plan: This patient is a 62 year old male with a history of depression and anxiety.  Unfortunately his daughter recently committed suicide on Christmas day which has been extremely difficult for him and his family.  He is holding together fairly well but probably would do better if he could get some sleep.  We will add Restoril 30 mg at bedtime to his regimen.  He will continue Zyprexa 10 mg at bedtime for mood stabilization, gabapentin 600 mg 3 times daily for anxiety, Prozac 60 mg daily for depression and Xanax 1 mg 4 times daily for anxiety.  Counseling was offered but he declined so he will return to see me in 6  weeks   [name], MD 08/25/2017, 10:18 AM

## 2017-09-02 ENCOUNTER — Encounter: Payer: Self-pay | Admitting: Cardiology

## 2017-09-02 ENCOUNTER — Ambulatory Visit: Payer: Medicare Other | Admitting: Cardiology

## 2017-09-02 VITALS — BP 144/90 | HR 66 | Ht 70.0 in | Wt 166.6 lb

## 2017-09-02 DIAGNOSIS — I10 Essential (primary) hypertension: Secondary | ICD-10-CM | POA: Diagnosis not present

## 2017-09-02 DIAGNOSIS — E782 Mixed hyperlipidemia: Secondary | ICD-10-CM | POA: Diagnosis not present

## 2017-09-02 DIAGNOSIS — I25118 Atherosclerotic heart disease of native coronary artery with other forms of angina pectoris: Secondary | ICD-10-CM

## 2017-09-02 MED ORDER — METOPROLOL SUCCINATE ER 25 MG PO TB24: 37.5000 mg | ORAL_TABLET | Freq: Every day | ORAL | 2 refills | Status: DC

## 2017-09-02 NOTE — Progress Notes (Signed)
Clinical Summary [name] is a 62 y.o.male seen today for follow up of the following medical problems.   1. 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 LVEF 50-55% by LV gram.  08/2011 echo LVEF 25%, grade I diastolic dysfunction.  03/2016 echo: LVEF 85%, normal diastolic function  - recent chest pain. SOB and chest pain walking to mailbox. Pressure midchest, 5/10 in severity. SYmptoms fairly stable since there onset a few months ago - leg pains, cannot run on treadmill.      2. Hyperlipidemia - 09/2016 TC 128 TG 131 HDL 34 LDL 68  - compliant with meds  3. HTN - compliant with meds  4. COPD - followed by pcp   5. OSA - does not use CPAP machine due to discomfort   6. GERD/Dysphagia - followed by GI     SH: enjoys working in yard, fishing.  Daughter recently hung herself.    Past Medical History:  Diagnosis Date  . Anxiety   . Anxiety and depression   . ASCVD (arteriosclerotic cardiovascular disease)    inferior MI with circumflex PTCA in 1993;cath in 2000-50% OM 1&2,30% LAD ;2008-25% LAD, 80% nondominant right ,60% small OM 2,EF of 55% with severe basilar inferior hypokinesis; stress nuclear study in 04/2008 moderate inferior scar with peri-infarction ischemic  . Asthma   . CAD (coronary artery disease)   . Carcinoma in situ of colon 2004   rectal polyp  . Colitis, ischemic ([place]) 2011  . COPD (chronic obstructive pulmonary disease) (HCC)    mild ;excercise induced hypoxemia by cp stress test ;asthma ,bronchitis,  . Depression   . Diverticulosis   . Gastritis 12/30/10   EGD Dr [name]  . GERD (gastroesophageal reflux disease)   . H. pylori infection 2004   treated  . Hemorrhoids   . Hiatal hernia   . Hyperlipidemia   . Hypertension   . Inflammatory polyps of colon with rectal bleeding ([place])    . Leukocytosis    [name]  . Lung nodule 07/16/2011  . Restless leg syndrome   . Schatzki's ring   . Sleep apnea    does not use CPAP  . Syncope   . Tobacco abuse    50 pack years continuing at one halp pack daily  . Tubular adenoma of colon 06/2009   Colonosocpy Dr [name]     No Known Allergies   Current Outpatient Medications  Medication Sig Dispense Refill  . albuterol (PROVENTIL) (2.5 MG/3ML) 0.083% nebulizer solution Take 2.5 mg by nebulization every 6 (six) hours as needed. For shortness of breath    . ALPRAZolam (XANAX) 1 MG tablet Take 1 tablet (1 mg total) by mouth 4 (four) times daily. 120 tablet 2  . aspirin EC 81 MG tablet Take 81 mg by mouth daily.     Marland Kitchen atorvastatin (LIPITOR) 80 MG tablet Take 1 tablet (80 mg total) by mouth daily. 90 tablet 3  . budesonide-formoterol (SYMBICORT) 80-4.5 MCG/ACT inhaler Inhale 2 puffs into the lungs 2 (two) times daily. 1 Inhaler 12  . FLUoxetine (PROZAC) 20 MG capsule Take 1 capsule (20 mg total) by mouth daily. 90 capsule 2  . FLUoxetine (PROZAC) 40 MG capsule Take 1 capsule (40 mg total) by mouth daily. 90 capsule 2  . fluticasone (FLONASE)  50 MCG/ACT nasal spray Place 2 sprays into both nostrils daily. 16 g 5  . gabapentin (NEURONTIN) 300 MG capsule Take 2 capsules (600 mg total) by mouth 3 (three) times daily. 180 capsule 2  . HYDROcodone-acetaminophen (NORCO) 7.5-325 MG tablet Take 1 tablet by mouth 2 (two) times daily as needed for moderate pain. 60 tablet 0  . hydrocortisone (ANUSOL-HC) 2.5 % rectal cream Place 1 application rectally as needed. Reported on 10/21/2015    . lisinopril (PRINIVIL,ZESTRIL) 2.5 MG tablet TAKE 1 TABLET (2.5 MG TOTAL) BY MOUTH DAILY. **STOP VERAPAMIL** 30 tablet 5  . metoprolol succinate (TOPROL-XL) 25 MG 24 hr tablet TAKE 1 TABLET BY MOUTH EVERY DAY 90 tablet 1  . nitroGLYCERIN (NITROSTAT) 0.4 MG SL tablet Place 0.4 mg under the tongue every 5 (five) minutes as needed for chest pain.    Marland Kitchen  OLANZapine (ZYPREXA) 10 MG tablet Take 1 tablet (10 mg total) by mouth at bedtime. 90 tablet 2  . pantoprazole (PROTONIX) 40 MG tablet TAKE 1 TABLET BY MOUTH TWICE A DAY *INS WILL PAY 09/21/15* 180 tablet 1  . promethazine (PHENERGAN) 12.5 MG tablet Take 12.5 mg by mouth every 6 (six) hours as needed for nausea or vomiting. Reported on 10/21/2015    . rOPINIRole (REQUIP) 2 MG tablet TAKE 1 TABLET (2 MG TOTAL) BY MOUTH AT BEDTIME. 90 tablet 2  . temazepam (RESTORIL) 30 MG capsule Take 1 capsule (30 mg total) by mouth at bedtime as needed for sleep. 30 capsule 2  . vitamin B-12 (CYANOCOBALAMIN) 1000 MCG tablet Take 2,000 mcg by mouth daily.     No current facility-administered medications for this visit.      Past Surgical History:  Procedure Laterality Date  . CARDIAC CATHETERIZATION    . CHOLECYSTECTOMY  2004  . COLONOSCOPY  06/2009   normal terminal ileum, segmental mild inflammation of sigmoid colon (bx unremarkable), polyp, tubular adenoma  . COLONOSCOPY N/A 07/31/2013   Dr.Rourk- redundant anal canal hemorrhoids, colonic diverticulosis, tubular adenoma  . COLONOSCOPY W/ POLYPECTOMY  2004   rectal polyp with carcinoma in situ removed via colonoscopy  . COLONOSCOPY WITH PROPOFOL N/A 09/15/2015   Procedure: COLONOSCOPY WITH PROPOFOL;  Surgeon: [name], MD;  Location: [facility];  Service: Endoscopy;  Laterality: N/A;  1015  . CORONARY ANGIOPLASTY WITH STENT PLACEMENT  01/26/2012   "1; total is now 2"  . ESOPHAGOGASTRODUODENOSCOPY  02/2009   query Barrett's but bx negative  . ESOPHAGOGASTRODUODENOSCOPY  12/30/2010   [name],gastritis, dilated 76F, sm HH, 1 small ulcer, Duodenal erosions, benign bx  . ESOPHAGOGASTRODUODENOSCOPY (EGD) WITH ESOPHAGEAL DILATION N/A 09/12/2012   UYQ:[id number] Schatzki's ring s/p Maloney dilator. Small hiatal hernia. negative path  . ESOPHAGOGASTRODUODENOSCOPY (EGD) WITH ESOPHAGEAL DILATION N/A 07/31/2013   Dr. [name]- normal egd, s/p [facility] dilation  empirically. Normal small bowel biopsies   . ESOPHAGOGASTRODUODENOSCOPY (EGD) WITH PROPOFOL N/A 09/15/2015   Procedure: ESOPHAGOGASTRODUODENOSCOPY (EGD) WITH PROPOFOL;  Surgeon: [name], MD;  Location: [facility];  Service: Endoscopy;  Laterality: N/A;  . FLEXIBLE SIGMOIDOSCOPY  12/30/2010    [name],; internal hemorrhoids, anal papilla  . HAND SURGERY     surgical intervention for injury of the fingers of the left hand many years ago  . heart stent    . HEMORRHOID BANDING     Dr. [name]  . LEFT HEART CATHETERIZATION WITH CORONARY ANGIOGRAM N/A 01/26/2012   Procedure: LEFT HEART CATHETERIZATION WITH CORONARY ANGIOGRAM;  Surgeon: [name], MD;  Location: [facility];  Service: Cardiovascular;  Laterality: N/A;  . [name] DILATION N/A 09/15/2015   Procedure: [name] DILATION;  Surgeon: [name], MD;  Location: [facility];  Service: Endoscopy;  Laterality: N/A;  . NASAL SEPTOPLASTY W/ TURBINOPLASTY  10/06/2011   Procedure: NASAL SEPTOPLASTY WITH TURBINATE REDUCTION;  Surgeon: Izora Gala, MD;  Location: [facility];  Service: ENT;  Laterality: Bilateral;  . PERCUTANEOUS CORONARY STENT INTERVENTION (PCI-S) N/A 01/26/2012   Procedure: PERCUTANEOUS CORONARY STENT INTERVENTION (PCI-S);  Surgeon: [name], MD;  Location[facility];  Service: Cardiovascular;  Laterality: N/A;  . POLYPECTOMY  09/15/2015   Procedure: POLYPECTOMY;  Surgeon: [name], MD;  Location: [facility];  Service: Endoscopy;;  Cecal polyp removed via cold snare/ Descending colon polyp removed via cold snare     No Known Allergies    Family History  Problem Relation Age of Onset  . Lung disease Father        deceased, black lung  . Heart disease Mother        blood clots  . Depression Mother   . Cancer Paternal Uncle        unknown type  . Cancer Maternal Aunt        unknown type  . Kidney failure Maternal Uncle   . Hypertension Brother   . Colon cancer Neg Hx   . ADD / ADHD Neg Hx     . Alcohol abuse Neg Hx   . Drug abuse Neg Hx   . Anxiety disorder Neg Hx   . Bipolar disorder Neg Hx   . Dementia Neg Hx   . OCD Neg Hx   . Paranoid behavior Neg Hx   . Schizophrenia Neg Hx   . Physical abuse Neg Hx   . Sexual abuse Neg Hx   . Seizures Neg Hx      Social History [name] reports that he has been smoking cigarettes.  He has a 15.00 pack-year smoking history. He has quit using smokeless tobacco. [name] reports that he does not drink alcohol.   Review of Systems CONSTITUTIONAL: No weight loss, fever, chills, weakness or fatigue.  HEENT: Eyes: No visual loss, blurred vision, double vision or yellow sclerae.No hearing loss, sneezing, congestion, runny nose or sore throat.  SKIN: No rash or itching.  CARDIOVASCULAR: per hpi RESPIRATORY: No shortness of breath, cough or sputum.  GASTROINTESTINAL: No anorexia, nausea, vomiting or diarrhea. No abdominal pain or blood.  GENITOURINARY: No burning on urination, no polyuria NEUROLOGICAL: No headache, dizziness, syncope, paralysis, ataxia, numbness or tingling in the extremities. No change in bowel or bladder control.  MUSCULOSKELETAL: No muscle, back pain, joint pain or stiffness.  LYMPHATICS: No enlarged nodes. No history of splenectomy.  PSYCHIATRIC: No history of depression or anxiety.  ENDOCRINOLOGIC: No reports of sweating, cold or heat intolerance. No polyuria or polydipsia.  Marland Kitchen   Physical Examination Vitals:   09/02/17 1350  BP: (!) 144/90  Pulse: 66  SpO2: 97%   Vitals:   09/02/17 1350  Weight: 166 lb 9.6 oz (75.6 kg)  Height: 5\' 10"  (1.778 m)    Gen: resting comfortably, no acute distress HEENT: no scleral icterus, pupils equal round and reactive, no palptable cervical adenopathy,  CV: RRR, no m/r/g, no jvd Resp: Clear to auscultation bilaterally GI: abdomen is soft, non-tender, non-distended, normal bowel sounds, no hepatosplenomegaly MSK: extremities are warm, no edema.  Skin: warm, no  rash Neuro:  no focal deficits Psych:  appropriate affect   Diagnostic Studies 02/2010 Cath DESCRIPTION OF PROCEDURE: The risks and indication were explained. Consent was signed and placed on the chart. A 4-French arterial sheath was placed in the right femoral artery using a modified Seldinger technique. A standard catheters including a JL-4, 3DRC, and angled pigtail were used. All catheter exchanges were made over wire. There were no apparent complications. Central aortic pressure 109/67 with a mean of 86. LV pressure 110/70 with an EDP of 28. There was no aortic stenosis.  Left main was normal.  LAD was a long vessel coursing to the apex, it gave off a moderate-sized diagonal Fannye Myer. There was a 40% lesion in the proximal LAD, otherwise minimal luminal irregularities.  Left circumflex was a large dominant vessel, gave off a narrow caliber ramus, large OM-1, and narrow caliber OM-2 in several posterolaterals. In the proximal left circumflex, he had a 40% lesion which extended into the proximal portion of the OM-1. There was a 30% lesion in the mid OM- 1. In the OM-2, there was a long 80% stenosis throughout the ostial and proximal portion. This vessel was very small caliber vessel.  Right coronary artery was a nondominant vessel, gave off an RV Benen Weida. There was approximately 90% stenosis in the proximal portion. Once again, this was a very small nondominant vessel.  Left ventriculogram done in the RAO position showed an EF of 50-55% with inferior basilar akinesis. There was no significant mitral regurgitation.  Abdominal aortogram showed 40% ostial lesion in the right renal artery. The left renal artery was widely patent. There was no abdominal aortic aneurysm. On panning down over the iliac and femoral system, there was no high-grade lesions.  ASSESSMENT: 1. Stable coronary artery disease with high-grade lesions in the small  obtuse marginal 2  and right coronary artery both of which are too  small for percutaneous intervention. 2. Left ventricular ejection fraction was 50-55% with mildly elevated  filling pressures. 3. Very mild right renal artery stenosis. 4. No obvious high-grade proximal peripheral arterial disease.  PLAN/DISCUSSION: I suspect most of his dyspnea is likely due to his lung disease. I have urged him to stop smoking. We will continue medical therapy now for his heart disease.   08/2011 Echo Study Conclusions  - Left ventricle: The cavity size was normal. There was mild focal basal hypertrophy of the septum. The estimated ejection fraction was 55%. There is akinesis of the basalinferior myocardium. Doppler parameters are consistent with abnormal left ventricular relaxation (grade 1 diastolic dysfunction). - Mitral valve: Trivial regurgitation. - Left atrium: The atrium was at the upper limits of normal in size. - Tricuspid valve: Trivial regurgitation. - Pulmonary arteries: PA peak pressure: 31mm Hg (S). - Pericardium, extracardiac: There was no pericardial effusion.  09/2015 ABIs: 1.2 bilaterally   03/2016 echo Study Conclusions  - Left ventricle: The cavity size was normal. Wall thickness was normal. The estimated ejection fraction was 50%. There is akinesis of the basal-midinferolateral and inferior myocardium. Left ventricular diastolic function parameters were normal. - Aortic valve: Mildly calcified annulus. Trileaflet. - Mitral valve: Mildly thickened leaflets . There was trivial regurgitation. - Left atrium: The atrium was at the upper limits of normal in size. - Right atrium: Central venous pressure (est): 3 mm Hg. - Atrial septum: No defect or patent foramen ovale was identified. - Tricuspid valve: There was trivial regurgitation. - Pulmonary arteries: PA peak pressure: 9 mm Hg (S). - Pericardium, extracardiac: There was no pericardial  effusion.  Impressions:  -  Normal LV wall thickness with LVEF approximately 50%. There is akinesis of the mid to basal inferolateral/inferior wall. Normal diastolic function. Upper normal left atrial chamber size. Mildly thickened mitral leaflets with trivial mitral regurgitation. Mildly sclerotic aortic valve. Trivial tricuspid regurgitation.     Assessment and Plan  1. CAD - recent chest pain symptoms, possible angina.  - we will obtain a lexiscan MPI to further evaluate. Increase Toprol to 37.5mg  daily for additional antianginal effects  2. Hyperlipidemia -at goal, continue statin  3. HTN - elevated, f/u with increased Toprol dose.   4. COPD - per pcp     F/u 6 weeks    Arnoldo Lenis, M.D.

## 2017-09-02 NOTE — Patient Instructions (Addendum)
  Your physician recommends that you schedule a follow-up appointment in:  6 weeks    Your physician has requested that you have a lexiscan myoview. For further information please visit [email or web address]. Please follow instruction sheet, as given.    INCREASE Toprol 37.5 mg daily  (1 1/2 tablets daily)   No lab work ordered today.    Thank you for choosing [facility] !

## 2017-09-05 ENCOUNTER — Encounter: Payer: Self-pay | Admitting: Cardiology

## 2017-09-09 ENCOUNTER — Encounter (HOSPITAL_BASED_OUTPATIENT_CLINIC_OR_DEPARTMENT_OTHER)
Admission: RE | Admit: 2017-09-09 | Discharge: 2017-09-09 | Disposition: A | Discharge status: Home / Self Care | Payer: Medicare Other | Source: Ambulatory Visit | Attending: Cardiology | Admitting: Cardiology

## 2017-09-09 ENCOUNTER — Ambulatory Visit (HOSPITAL_COMMUNITY)
Admission: RE | Admit: 2017-09-09 | Discharge: 2017-09-09 | Disposition: A | Discharge status: Home / Self Care | Payer: Medicare Other | Source: Ambulatory Visit | Attending: Family Medicine | Admitting: Family Medicine

## 2017-09-09 DIAGNOSIS — I25118 Atherosclerotic heart disease of native coronary artery with other forms of angina pectoris: Secondary | ICD-10-CM | POA: Insufficient documentation

## 2017-09-09 DIAGNOSIS — R9439 Abnormal result of other cardiovascular function study: Secondary | ICD-10-CM | POA: Insufficient documentation

## 2017-09-09 LAB — NM MYOCAR MULTI W/SPECT W/WALL MOTION / EF
CHL CUP NUCLEAR SSS: 14
CHL CUP RESTING HR STRESS: 59 {beats}/min
LHR: 0.34
LVDIAVOL: 106 mL (ref 62–150)
LVSYSVOL: 59 mL
NUC STRESS TID: 1.1
Peak HR: 92 {beats}/min
SDS: 2
SRS: 12

## 2017-09-09 MED ORDER — TECHNETIUM TC 99M TETROFOSMIN IV KIT
10.0000 | PACK | Freq: Once | INTRAVENOUS | Status: AC | PRN
  Administered 2017-09-09: 10 via INTRAVENOUS

## 2017-09-09 MED ORDER — REGADENOSON 0.4 MG/5ML IV SOLN
INTRAVENOUS | Status: AC
  Administered 2017-09-09: 0.4 mg via INTRAVENOUS
  Filled 2017-09-09: qty 5

## 2017-09-09 MED ORDER — SODIUM CHLORIDE 0.9% FLUSH
INTRAVENOUS | Status: AC
  Administered 2017-09-09: 10 mL via INTRAVENOUS
  Filled 2017-09-09: qty 10

## 2017-09-09 MED ORDER — TECHNETIUM TC 99M TETROFOSMIN IV KIT
30.0000 | PACK | Freq: Once | INTRAVENOUS | Status: AC | PRN
  Administered 2017-09-09: 30 via INTRAVENOUS

## 2017-09-12 ENCOUNTER — Other Ambulatory Visit: Payer: Self-pay | Admitting: Family Medicine

## 2017-10-06 ENCOUNTER — Encounter (HOSPITAL_COMMUNITY): Payer: Self-pay | Admitting: Psychiatry

## 2017-10-06 ENCOUNTER — Ambulatory Visit (HOSPITAL_COMMUNITY): Payer: Medicare Other | Admitting: Psychiatry

## 2017-10-06 VITALS — BP 145/89 | HR 71 | Ht 70.0 in | Wt 167.0 lb

## 2017-10-06 DIAGNOSIS — F418 Other specified anxiety disorders: Secondary | ICD-10-CM | POA: Diagnosis not present

## 2017-10-06 DIAGNOSIS — J449 Chronic obstructive pulmonary disease, unspecified: Secondary | ICD-10-CM | POA: Diagnosis not present

## 2017-10-06 DIAGNOSIS — I2510 Atherosclerotic heart disease of native coronary artery without angina pectoris: Secondary | ICD-10-CM | POA: Diagnosis not present

## 2017-10-06 DIAGNOSIS — Z6379 Other stressful life events affecting family and household: Secondary | ICD-10-CM | POA: Diagnosis not present

## 2017-10-06 DIAGNOSIS — Z818 Family history of other mental and behavioral disorders: Secondary | ICD-10-CM | POA: Diagnosis not present

## 2017-10-06 DIAGNOSIS — Z736 Limitation of activities due to disability: Secondary | ICD-10-CM | POA: Diagnosis not present

## 2017-10-06 DIAGNOSIS — Z634 Disappearance and death of family member: Secondary | ICD-10-CM

## 2017-10-06 DIAGNOSIS — F5105 Insomnia due to other mental disorder: Secondary | ICD-10-CM

## 2017-10-06 DIAGNOSIS — F17210 Nicotine dependence, cigarettes, uncomplicated: Secondary | ICD-10-CM | POA: Diagnosis not present

## 2017-10-06 MED ORDER — FLUOXETINE HCL 40 MG PO CAPS: 40.0000 mg | ORAL_CAPSULE | Freq: Every day | ORAL | 2 refills | Status: DC

## 2017-10-06 MED ORDER — ALPRAZOLAM 1 MG PO TABS: 1.0000 mg | ORAL_TABLET | Freq: Four times a day (QID) | ORAL | 2 refills | Status: DC

## 2017-10-06 MED ORDER — FLUOXETINE HCL 20 MG PO CAPS: 20.0000 mg | ORAL_CAPSULE | Freq: Every day | ORAL | 2 refills | Status: DC

## 2017-10-06 MED ORDER — OLANZAPINE 10 MG PO TABS: 10.0000 mg | ORAL_TABLET | Freq: Every day | ORAL | 2 refills | Status: DC

## 2017-10-06 MED ORDER — TEMAZEPAM 30 MG PO CAPS: 30.0000 mg | ORAL_CAPSULE | Freq: Every evening | ORAL | 2 refills | Status: DC | PRN

## 2017-10-06 NOTE — Progress Notes (Signed)
[place] MD/PA/NP OP Progress Note  10/06/2017 8:16 AM [name]  MRN:  [id number]  Chief Complaint:  Chief Complaint    Depression; Anxiety; Follow-up     HPI: This patient is a 62 year old married male who lives with his wife in [place].  He is on disability for coronary artery disease and COPD.  He used to work for [employer].  The patient returns for follow-up of his depression and anxiety.  Last time which was about a month ago he related to me that his adopted daughter had hung herself in their backyard on Christmas night.  He was having a very hard time dealing with this.  She left behind her 71-year-old son.  He and his wife now have full custody of this child.  Her older daughter lives with the father but visits every weekend.  The patient states that his grandchildren keep him going.  Last time we tried to add Restoril to his regimen but the pharmacy never filled it and I am going to try again.  His sleep is still quite variable.  Fortunately he is trying to get outside and do things around the house and yard which is helped a lot.  His primary doctor put him on Requip for his restless legs and this is helping his sleep to some degree.  He thinks he is starting to come to terms with his daughter's death. Visit Diagnosis:    ICD-10-CM   1. Depression with anxiety F41.8   2. 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 Anxiety   . Anxiety and depression   . ASCVD (arteriosclerotic cardiovascular disease)    inferior MI with circumflex PTCA in 1993;cath in 2000-50% OM 1&2,30% LAD ;2008-25% LAD, 80% nondominant right ,60% small OM 2,EF of 55% with severe basilar inferior hypokinesis; stress nuclear study in 04/2008 moderate inferior scar with peri-infarction ischemic  . Asthma   . CAD (coronary artery disease)   . Carcinoma in situ of colon 2004   rectal polyp  .  Colitis, ischemic ([place]) 2011  . COPD (chronic obstructive pulmonary disease) (HCC)    mild ;excercise induced hypoxemia by cp stress test ;asthma ,bronchitis,  . Depression   . Diverticulosis   . Gastritis 12/30/10   EGD Dr [name]  . GERD (gastroesophageal reflux disease)   . H. pylori infection 2004   treated  . Hemorrhoids   . Hiatal hernia   . Hyperlipidemia   . Hypertension   . Inflammatory polyps of colon with rectal bleeding ([place])   . Leukocytosis    [name]  . Lung nodule 07/16/2011  . Restless leg syndrome   . Schatzki's ring   . Sleep apnea    does not use CPAP  . Syncope   . Tobacco abuse    50 pack years continuing at one halp pack daily  . Tubular adenoma of colon 06/2009   Colonosocpy Dr [name]    Past Surgical History:  Procedure Laterality Date  . CARDIAC CATHETERIZATION    . CHOLECYSTECTOMY  2004  . COLONOSCOPY  06/2009   normal terminal ileum, segmental mild inflammation of sigmoid colon (bx unremarkable), polyp, tubular adenoma  . COLONOSCOPY N/A 07/31/2013   Dr.Rourk- redundant anal canal hemorrhoids, colonic diverticulosis, tubular adenoma  . COLONOSCOPY W/ POLYPECTOMY  2004   rectal polyp with carcinoma in situ removed via  colonoscopy  . COLONOSCOPY WITH PROPOFOL N/A 09/15/2015   Procedure: COLONOSCOPY WITH PROPOFOL;  Surgeon: [name], MD;  [facility];  Service: Endoscopy;  Laterality: N/A;  1015  . CORONARY ANGIOPLASTY WITH STENT PLACEMENT  01/26/2012   "1; total is now 2"  . ESOPHAGOGASTRODUODENOSCOPY  02/2009   query Barrett's but bx negative  . ESOPHAGOGASTRODUODENOSCOPY  12/30/2010   [name],gastritis, dilated 90F, sm HH, 1 small ulcer, Duodenal erosions, benign bx  . ESOPHAGOGASTRODUODENOSCOPY (EGD) WITH ESOPHAGEAL DILATION N/A 09/12/2012   WVP:[id number] Schatzki's ring s/p Maloney dilator. Small hiatal hernia. negative path  . ESOPHAGOGASTRODUODENOSCOPY (EGD) WITH ESOPHAGEAL DILATION N/A 07/31/2013   Dr. [name]- normal  egd, s/p [facility] dilation empirically. Normal small bowel biopsies   . ESOPHAGOGASTRODUODENOSCOPY (EGD) WITH PROPOFOL N/A 09/15/2015   Procedure: ESOPHAGOGASTRODUODENOSCOPY (EGD) WITH PROPOFOL;  Surgeon: [name], MD;  [facility];  Service: Endoscopy;  Laterality: N/A;  . FLEXIBLE SIGMOIDOSCOPY  12/30/2010    [name],; internal hemorrhoids, anal papilla  . HAND SURGERY     surgical intervention for injury of the fingers of the left hand many years ago  . heart stent    . HEMORRHOID BANDING     Dr. [name]  . LEFT HEART CATHETERIZATION WITH CORONARY ANGIOGRAM N/A 01/26/2012   Procedure: LEFT HEART CATHETERIZATION WITH CORONARY ANGIOGRAM;  Surgeon: [name], MD;  Location: [facility] CATH [facility];  Service: Cardiovascular;  Laterality: N/A;  . [name] DILATION N/A 09/15/2015   Procedure: [name] DILATION;  Surgeon: [name], MD;  [facility];  Service: Endoscopy;  Laterality: N/A;  . NASAL SEPTOPLASTY W/ TURBINOPLASTY  10/06/2011   Procedure: NASAL SEPTOPLASTY WITH TURBINATE REDUCTION;  Surgeon: Izora Gala, MD;  Location: [facility];  Service: ENT;  Laterality: Bilateral;  . PERCUTANEOUS CORONARY STENT INTERVENTION (PCI-S) N/A 01/26/2012   Procedure: PERCUTANEOUS CORONARY STENT INTERVENTION (PCI-S);  Surgeon: [name], MD;  Location: [facility] CATH [facility];  Service: Cardiovascular;  Laterality: N/A;  . POLYPECTOMY  09/15/2015   Procedure: POLYPECTOMY;  Surgeon: [name], MD;  [facility];  Service: Endoscopy;;  Cecal polyp removed via cold snare/ Descending colon polyp removed via cold snare    Family Psychiatric History: See below  Family History:  Family History  Problem Relation Age of Onset  . Lung disease Father        deceased, black lung  . Heart disease Mother        blood clots  . Depression Mother   . Cancer Paternal Uncle        unknown type  . Cancer Maternal Aunt        unknown type  . Kidney failure Maternal Uncle   . Hypertension  Brother   . Colon cancer Neg Hx   . ADD / ADHD Neg Hx   . Alcohol abuse Neg Hx   . Drug abuse Neg Hx   . Anxiety disorder Neg Hx   . Bipolar disorder Neg Hx   . Dementia Neg Hx   . OCD Neg Hx   . Paranoid behavior Neg Hx   . Schizophrenia Neg Hx   . Physical abuse Neg Hx   . Sexual abuse Neg Hx   . Seizures Neg Hx     Social History:  Social History   Socioeconomic History  . Marital status: Married    Spouse name: Not on file  . Number of children: 3  . Years of education: Not on  file  . Highest education level: Not on file  Occupational History  . Occupation: disable    Employer: RETIRED    Comment: [employer]  Social Needs  . Financial resource strain: Not on file  . Food insecurity:    Worry: Not on file    Inability: Not on file  . Transportation needs:    Medical: Not on file    Non-medical: Not on file  Tobacco Use  . Smoking status: Current Some Day Smoker    Packs/day: 0.50    Years: 30.00    Pack years: 15.00    Types: Cigarettes    Start date: 07/31/1969  . Smokeless tobacco: Former Systems developer  . Tobacco comment: cutting back trying to quit  Substance and Sexual Activity  . Alcohol use: No    Comment: Drinks a beer occasionally  . Drug use: No  . Sexual activity: Not Currently  Lifestyle  . Physical activity:    Days per week: Not on file    Minutes per session: Not on file  . Stress: Not on file  Relationships  . Social connections:    Talks on phone: Not on file    Gets together: Not on file    Attends religious service: Not on file    Active member of club or organization: Not on file    Attends meetings of clubs or organizations: Not on file    Relationship status: Not on file  Other Topics Concern  . Not on file  Social History Narrative   3 stepchildren    Allergies: No Known Allergies  Metabolic Disorder Labs: Lab Results  Component Value Date   HGBA1C 5.3 07/24/2015   MPG 103 10/29/2013   MPG 97 04/30/2013   No results found for:  PROLACTIN Lab Results  Component Value Date   CHOL 128 10/01/2016   TRIG 131 10/01/2016   HDL 34 (L) 10/01/2016   CHOLHDL 3.8 10/01/2016   VLDL 64 (H) 06/20/2014   LDLCALC 68 10/01/2016   LDLCALC 60 01/22/2016   Lab Results  Component Value Date   TSH 1.011 02/09/2010    Therapeutic Level Labs: No results found for: LITHIUM No results found for: VALPROATE No components found for:  CBMZ  Current Medications: Current Outpatient Medications  Medication Sig Dispense Refill  . albuterol (PROVENTIL) (2.5 MG/3ML) 0.083% nebulizer solution Take 2.5 mg by nebulization every 6 (six) hours as needed. For shortness of breath    . ALPRAZolam (XANAX) 1 MG tablet Take 1 tablet (1 mg total) by mouth 4 (four) times daily. 120 tablet 2  . aspirin EC 81 MG tablet Take 81 mg by mouth daily.    Marland Kitchen atorvastatin (LIPITOR) 80 MG tablet Take 1 tablet (80 mg total) by mouth daily. 90 tablet 3  . budesonide-formoterol (SYMBICORT) 80-4.5 MCG/ACT inhaler Inhale 2 puffs into the lungs 2 (two) times daily. 1 Inhaler 12  . FLUoxetine (PROZAC) 20 MG capsule Take 1 capsule (20 mg total) by mouth daily. 90 capsule 2  . FLUoxetine (PROZAC) 40 MG capsule Take 1 capsule (40 mg total) by mouth daily. 90 capsule 2  . fluticasone (FLONASE) 50 MCG/ACT nasal spray Place 2 sprays into both nostrils daily. 16 g 5  . gabapentin (NEURONTIN) 300 MG capsule Take 2 capsules (600 mg total) by mouth 3 (three) times daily. 180 capsule 2  . HYDROcodone-acetaminophen (NORCO) 7.5-325 MG tablet Take 1 tablet by mouth 2 (two) times daily as needed for moderate pain. 60 tablet 0  .  hydrocortisone (ANUSOL-HC) 2.5 % rectal cream Place 1 application rectally as needed. Reported on 10/21/2015    . lisinopril (PRINIVIL,ZESTRIL) 2.5 MG tablet TAKE 1 TABLET (2.5 MG TOTAL) BY MOUTH DAILY. **STOP VERAPAMIL** 30 tablet 4  . metoprolol succinate (TOPROL-XL) 25 MG 24 hr tablet Take 1.5 tablets (37.5 mg total) by mouth daily. 135 tablet 2  .  nitroGLYCERIN (NITROSTAT) 0.4 MG SL tablet Place 0.4 mg under the tongue every 5 (five) minutes as needed for chest pain.    Marland Kitchen OLANZapine (ZYPREXA) 10 MG tablet Take 1 tablet (10 mg total) by mouth at bedtime. 90 tablet 2  . pantoprazole (PROTONIX) 40 MG tablet TAKE 1 TABLET BY MOUTH TWICE A DAY *INS WILL PAY 09/21/15* 180 tablet 1  . promethazine (PHENERGAN) 12.5 MG tablet Take 12.5 mg by mouth every 6 (six) hours as needed for nausea or vomiting. Reported on 10/21/2015    . rOPINIRole (REQUIP) 2 MG tablet TAKE 1 TABLET (2 MG TOTAL) BY MOUTH AT BEDTIME. 90 tablet 2  . temazepam (RESTORIL) 30 MG capsule Take 1 capsule (30 mg total) by mouth at bedtime as needed for sleep. 30 capsule 2  . vitamin B-12 (CYANOCOBALAMIN) 1000 MCG tablet Take 2,000 mcg by mouth daily.     No current facility-administered medications for this visit.      Musculoskeletal: Strength & Muscle Tone: within normal limits Gait & Station: normal Patient leans: N/A  Psychiatric Specialty Exam: Review of Systems  Constitutional: Positive for malaise/fatigue.  Psychiatric/Behavioral: Positive for depression. The patient has insomnia.   All other systems reviewed and are negative.   Blood pressure (!) 145/89, pulse 71, height 5\' 10"  (1.778 m), weight 167 lb (75.8 kg), SpO2 97 %.Body mass index is 23.96 kg/m.  General Appearance: Casual and Fairly Groomed  Eye Contact:  Good  Speech:  Clear and Coherent  Volume:  Normal  Mood:  Anxious  Affect:  Congruent  Thought Process:  Goal Directed  Orientation:  Full (Time, Place, and Person)  Thought Content: Rumination   Suicidal Thoughts:  No  Homicidal Thoughts:  No  Memory:  Immediate;   Good Recent;   Good Remote;   Fair  Judgement:  Fair  Insight:  Fair  Psychomotor Activity:  Decreased  Concentration:  Concentration: Fair and Attention Span: Fair  Recall:  Good  Fund of Knowledge: Fair  Language: Good  Akathisia:  No  Handed:  Right  AIMS (if indicated): not  done  Assets:  Communication Skills Desire for Improvement Resilience Social Support Talents/Skills  ADL's:  Intact  Cognition: WNL  Sleep:  Poor   Screenings: PHQ2-9     Office Visit from 05/19/2017 in [place] Office Visit from 10/21/2015 in [place] Office Visit from 04/23/2015 in [facility]  PHQ-2 Total Score  0  0  0       Assessment and Plan: This patient is a 62 year old male with a history of depression and anxiety as well as insomnia.  He has been doing fairly well until his daughter committed suicide last December.  He is starting to come back around but still having trouble sleeping.  At this time I will print the Restoril 30 mg prescription to see if he can find a local drugstore.  This should help with his sleep.  In the meantime we will continue Prozac 60 mg daily for depression, olanzapine 10 mg daily for mood stabilization and Xanax 1 mg 4 times daily for anxiety.  He  will return to see me in 2 months   [name], MD 10/06/2017, 8:16 AM

## 2017-10-13 NOTE — Progress Notes (Signed)
Cardiology Office Note    Date:  10/14/2017   ID:  [name], DOB 09/13/1955, MRN [id number]  PCP:  [name], MD  Cardiologist: Carlyle Dolly, MD    Chief Complaint  Patient presents with  . Follow-up    6 weeks    History of Present Illness:    [name] is a 62 y.o. male with past medical history of CAD (s/p PTCA to LCx in 1993, cath in 2011 showing diffuse disease with 80% OM2 stenosis and small non-dominant 90% RCA stenosis with medical management recommended), HTN, HLD, and COPD who presents to the office today for 6-week follow-up.  He was last examined by Dr. Harl Bowie in 08/2017 and reported frequent episodes of chest discomfort and dyspnea on exertion when walking to the mailbox. Reported symptoms have been stable over the past several months. A Lexiscan Myoview was recommended for ischemic evaluation and Toprol-XL was increased to 37.5 mg daily for additional antianginal effects.  NST was performed on 09/09/2017 and showed findings consistent with large prior inferior/inferolateral myocardial infarction but no current myocardium at jeopardy. This was overall an intermediate risk study as EF was mildly decreased at 45% (50% by echo in 2017).   In talking with the patient today, he reports that his overall dyspnea on exertion has improved but he does have occasional episodes of chest discomfort which can occur at rest and usually resolve within a few minutes. He denies any associated orthopnea, PND, or lower extremity edema. Does report being under increased stress over the past several months as his daughter committed suicide in 05/2017. He is currently helping to raise his 2 grandchildren.  He does not check his blood pressure regularly at home but it is well controlled at 124/86 during today's visit.  Reports good compliance with medication regimen.  Past Medical History:  Diagnosis Date  . Anxiety   . Anxiety and depression   . ASCVD (arteriosclerotic  cardiovascular disease)    inferior MI with circumflex PTCA in 1993;cath in 2000-50% OM 1&2,30% LAD ;2008-25% LAD, 80% nondominant right ,60% small OM 2,EF of 55% with severe basilar inferior hypokinesis; stress nuclear study in 04/2008 moderate inferior scar with peri-infarction ischemic  . Asthma   . CAD (coronary artery disease)   . Carcinoma in situ of colon 2004   rectal polyp  . Colitis, ischemic ([place]) 2011  . COPD (chronic obstructive pulmonary disease) (HCC)    mild ;excercise induced hypoxemia by cp stress test ;asthma ,bronchitis,  . Depression   . Diverticulosis   . Gastritis 12/30/10   EGD Dr [name]  . GERD (gastroesophageal reflux disease)   . H. pylori infection 2004   treated  . Hemorrhoids   . Hiatal hernia   . Hyperlipidemia   . Hypertension   . Inflammatory polyps of colon with rectal bleeding ([place])   . Leukocytosis    [name]  . Lung nodule 07/16/2011  . Restless leg syndrome   . Schatzki's ring   . Sleep apnea    does not use CPAP  . Syncope   . Tobacco abuse    50 pack years continuing at one halp pack daily  . Tubular adenoma of colon 06/2009   Colonosocpy Dr [name]    Past Surgical History:  Procedure Laterality Date  . CARDIAC CATHETERIZATION    . CHOLECYSTECTOMY  2004  . COLONOSCOPY  06/2009   normal terminal ileum, segmental mild inflammation of sigmoid colon (bx unremarkable), polyp, tubular adenoma  .  COLONOSCOPY N/A 07/31/2013   Dr.Rourk- redundant anal canal hemorrhoids, colonic diverticulosis, tubular adenoma  . COLONOSCOPY W/ POLYPECTOMY  2004   rectal polyp with carcinoma in situ removed via colonoscopy  . COLONOSCOPY WITH PROPOFOL N/A 09/15/2015   Procedure: COLONOSCOPY WITH PROPOFOL;  Surgeon: [name], MD;  [facility];  Service: Endoscopy;  Laterality: N/A;  1015  . CORONARY ANGIOPLASTY WITH STENT PLACEMENT  01/26/2012   "1; total is now 2"  . ESOPHAGOGASTRODUODENOSCOPY  02/2009   query Barrett's but bx negative    . ESOPHAGOGASTRODUODENOSCOPY  12/30/2010   [name],gastritis, dilated 2F, sm HH, 1 small ulcer, Duodenal erosions, benign bx  . ESOPHAGOGASTRODUODENOSCOPY (EGD) WITH ESOPHAGEAL DILATION N/A 09/12/2012   PIR:JJOACZYSAYT Schatzki's ring s/p Maloney dilator. Small hiatal hernia. negative path  . ESOPHAGOGASTRODUODENOSCOPY (EGD) WITH ESOPHAGEAL DILATION N/A 07/31/2013   Dr. [name]- normal egd, s/p [facility] dilation empirically. Normal small bowel biopsies   . ESOPHAGOGASTRODUODENOSCOPY (EGD) WITH PROPOFOL N/A 09/15/2015   Procedure: ESOPHAGOGASTRODUODENOSCOPY (EGD) WITH PROPOFOL;  Surgeon: [name], MD;  [facility]: [facility];  Service: Endoscopy;  Laterality: N/A;  . FLEXIBLE SIGMOIDOSCOPY  12/30/2010    [name],; internal hemorrhoids, anal papilla  . HAND SURGERY     surgical intervention for injury of the fingers of the left hand many years ago  . heart stent    . HEMORRHOID BANDING     Dr. [name]  . LEFT HEART CATHETERIZATION WITH CORONARY ANGIOGRAM N/A 01/26/2012   Procedure: LEFT HEART CATHETERIZATION WITH CORONARY ANGIOGRAM;  Surgeon: [name], MD;  Location: [facility] CATH LAB;  Service: Cardiovascular;  Laterality: N/A;  . [name] DILATION N/A 09/15/2015   Procedure: [name] DILATION;  Surgeon: [name], MD;  [facility];  Service: Endoscopy;  Laterality: N/A;  . NASAL SEPTOPLASTY W/ TURBINOPLASTY  10/06/2011   Procedure: NASAL SEPTOPLASTY WITH TURBINATE REDUCTION;  Surgeon: Izora Gala, MD;  Location: [place];  Service: ENT;  Laterality: Bilateral;  . PERCUTANEOUS CORONARY STENT INTERVENTION (PCI-S) N/A 01/26/2012   Procedure: PERCUTANEOUS CORONARY STENT INTERVENTION (PCI-S);  Surgeon: [name], MD;  Location[facility] C[facility] LAB;  Service: Cardiovascular;  Laterality: N/A;  . POLYPECTOMY  09/15/2015   Procedure: POLYPECTOMY;  Surgeon: [name], MD;  [facility];  Service: Endoscopy;;  Cecal polyp removed via cold snare/ Descending colon polyp  removed via cold snare    Current Medications: Outpatient Medications Prior to Visit  Medication Sig Dispense Refill  . albuterol (PROVENTIL) (2.5 MG/3ML) 0.083% nebulizer solution Take 2.5 mg by nebulization every 6 (six) hours as needed. For shortness of breath    . ALPRAZolam (XANAX) 1 MG tablet Take 1 tablet (1 mg total) by mouth 4 (four) times daily. 120 tablet 2  . aspirin EC 81 MG tablet Take 81 mg by mouth daily.    Marland Kitchen atorvastatin (LIPITOR) 80 MG tablet Take 1 tablet (80 mg total) by mouth daily. 90 tablet 3  . budesonide-formoterol (SYMBICORT) 80-4.5 MCG/ACT inhaler Inhale 2 puffs into the lungs 2 (two) times daily. 1 Inhaler 12  . FLUoxetine (PROZAC) 20 MG capsule Take 1 capsule (20 mg total) by mouth daily. 90 capsule 2  . FLUoxetine (PROZAC) 40 MG capsule Take 1 capsule (40 mg total) by mouth daily. 90 capsule 2  . fluticasone (FLONASE) 50 MCG/ACT nasal spray Place 2 sprays into both nostrils daily. 16 g 5  . gabapentin (NEURONTIN) 300 MG capsule Take 2 capsules (600 mg total) by mouth  3 (three) times daily. 180 capsule 2  . HYDROcodone-acetaminophen (NORCO) 7.5-325 MG tablet Take 1 tablet by mouth 2 (two) times daily as needed for moderate pain. 60 tablet 0  . hydrocortisone (ANUSOL-HC) 2.5 % rectal cream Place 1 application rectally as needed. Reported on 10/21/2015    . lisinopril (PRINIVIL,ZESTRIL) 2.5 MG tablet TAKE 1 TABLET (2.5 MG TOTAL) BY MOUTH DAILY. **STOP VERAPAMIL** 30 tablet 4  . nitroGLYCERIN (NITROSTAT) 0.4 MG SL tablet Place 0.4 mg under the tongue every 5 (five) minutes as needed for chest pain.    Marland Kitchen OLANZapine (ZYPREXA) 10 MG tablet Take 1 tablet (10 mg total) by mouth at bedtime. 90 tablet 2  . pantoprazole (PROTONIX) 40 MG tablet TAKE 1 TABLET BY MOUTH TWICE A DAY *INS WILL PAY 09/21/15* 180 tablet 1  . promethazine (PHENERGAN) 12.5 MG tablet Take 12.5 mg by mouth every 6 (six) hours as needed for nausea or vomiting. Reported on 10/21/2015    . rOPINIRole (REQUIP) 2  MG tablet TAKE 1 TABLET (2 MG TOTAL) BY MOUTH AT BEDTIME. 90 tablet 2  . temazepam (RESTORIL) 30 MG capsule Take 1 capsule (30 mg total) by mouth at bedtime as needed for sleep. 30 capsule 2  . vitamin B-12 (CYANOCOBALAMIN) 1000 MCG tablet Take 2,000 mcg by mouth daily.    . metoprolol succinate (TOPROL-XL) 25 MG 24 hr tablet Take 1.5 tablets (37.5 mg total) by mouth daily. 135 tablet 2   No facility-administered medications prior to visit.      Allergies:   Patient has no known allergies.   Social History   Socioeconomic History  . Marital status: Married    Spouse name: Not on file  . Number of children: 3  . Years of education: Not on file  . Highest education level: Not on file  Occupational History  . Occupation: disable    Employer: RETIRED    Comment: [employer]  Social Needs  . Financial resource strain: Not on file  . Food insecurity:    Worry: Not on file    Inability: Not on file  . Transportation needs:    Medical: Not on file    Non-medical: Not on file  Tobacco Use  . Smoking status: Current Some Day Smoker    Packs/day: 0.50    Years: 30.00    Pack years: 15.00    Types: Cigarettes    Start date: 07/31/1969  . Smokeless tobacco: Former Systems developer  . Tobacco comment: cutting back trying to quit  Substance and Sexual Activity  . Alcohol use: No    Comment: Drinks a beer occasionally  . Drug use: No  . Sexual activity: Not Currently  Lifestyle  . Physical activity:    Days per week: Not on file    Minutes per session: Not on file  . Stress: Not on file  Relationships  . Social connections:    Talks on phone: Not on file    Gets together: Not on file    Attends religious service: Not on file    Active member of club or organization: Not on file    Attends meetings of clubs or organizations: Not on file    Relationship status: Not on file  Other Topics Concern  . Not on file  Social History Narrative   3 stepchildren     Family History:  The patient's family  history includes Cancer in his maternal aunt and paternal uncle; Depression in his mother; Heart disease in his mother; Hypertension  in his brother; Kidney failure in his maternal uncle; Lung disease in his father.   Review of Systems:   Please see the history of present illness.     General:  No chills, fever, night sweats or weight changes.  Cardiovascular:  No dyspnea on exertion, edema, orthopnea, palpitations, paroxysmal nocturnal dyspnea. Positive for chest pain.  Dermatological: No rash, lesions/masses Respiratory: No cough, dyspnea Urologic: No hematuria, dysuria Abdominal:   No nausea, vomiting, diarrhea, bright red blood per rectum, melena, or hematemesis Neurologic:  No visual changes, wkns, changes in mental status. All other systems reviewed and are otherwise negative except as noted above.   Physical Exam:    VS:  BP 124/86   Pulse 73   Ht 5\' 11"  (1.803 m)   Wt 171 lb (77.6 kg)   SpO2 96%   BMI 23.85 kg/m    General: Well developed, well nourished Caucasian male appearing in no acute distress. Head: Normocephalic, atraumatic, sclera non-icteric, no xanthomas, nares are without discharge.  Neck: No carotid bruits. JVD not elevated.  Lungs: Respirations regular and unlabored, without wheezes or rales.  Heart: Regular rate and rhythm. No S3 or S4.  No murmur, no rubs, or gallops appreciated. Abdomen: Soft, non-tender, non-distended with normoactive bowel sounds. No hepatomegaly. No rebound/guarding. No obvious abdominal masses. Msk:  Strength and tone appear normal for age. No joint deformities or effusions. Extremities: No clubbing or cyanosis. No lower extremity edema.  Distal pedal pulses are 2+ bilaterally. Neuro: Alert and oriented X 3. Moves all extremities spontaneously. No focal deficits noted. Psych:  Responds to questions appropriately with a normal affect. Skin: No rashes or lesions noted  Wt Readings from Last 3 Encounters:  10/14/17 171 lb (77.6 kg)    09/02/17 166 lb 9.6 oz (75.6 kg)  08/17/17 167 lb 0.4 oz (75.8 kg)     Studies/Labs Reviewed:   EKG:  EKG is not ordered today.   Recent Labs: 08/17/2017: Hemoglobin 16.7   Lipid Panel    Component Value Date/Time   CHOL 128 10/01/2016 0921   TRIG 131 10/01/2016 0921   HDL 34 (L) 10/01/2016 0921   CHOLHDL 3.8 10/01/2016 0921   CHOLHDL 5.6 06/20/2014 0748   VLDL 64 (H) 06/20/2014 0748   LDLCALC 68 10/01/2016 0921    Additional studies/ records that were reviewed today include:   NST: 09/09/2017  There was no ST segment deviation noted during stress.  Findings consistent with large prior inferior/inferolateral myocardial infarction.  This is an intermediate risk study. Risk based on decreased LVEF, there is no current myocardium at jeopardy.  The left ventricular ejection fraction is mildly decreased (45%).    Echocardiogram: 03/24/2016 Study Conclusions  - Left ventricle: The cavity size was normal. Wall thickness was   normal. The estimated ejection fraction was 50%. There is   akinesis of the basal-midinferolateral and inferior myocardium.   Left ventricular diastolic function parameters were normal. - Aortic valve: Mildly calcified annulus. Trileaflet. - Mitral valve: Mildly thickened leaflets . There was trivial   regurgitation. - Left atrium: The atrium was at the upper limits of normal in   size. - Right atrium: Central venous pressure (est): 3 mm Hg. - Atrial septum: No defect or patent foramen ovale was identified. - Tricuspid valve: There was trivial regurgitation. - Pulmonary arteries: PA peak pressure: 9 mm Hg (S). - Pericardium, extracardiac: There was no pericardial effusion.  Impressions:  - Normal LV wall thickness with LVEF approximately 50%. There is  akinesis of the mid to basal inferolateral/inferior wall. Normal   diastolic function. Upper normal left atrial chamber size. Mildly   thickened mitral leaflets with trivial mitral  regurgitation.   Mildly sclerotic aortic valve. Trivial tricuspid regurgitation.  Assessment:    1. Coronary artery disease involving native coronary artery of native heart with other form of angina pectoris ([place])   2. Essential hypertension   3. Mixed hyperlipidemia   4. Tobacco use disorder      Plan:   In order of problems listed above:  1. CAD - the patient has known CAD with PTCA to LCx in 1993 and catheterization in 2011 showing diffuse disease with 80% OM2 stenosis and small non-dominant 90% RCA stenosis with medical management recommended at that time. Recent NST showed findings consistent with a large prior inferior/inferolateral myocardial infarction but no current myocardium at jeopardy. This was overall an intermediate risk study as EF was mildly decreased at 45% (50% by echo in 2017). - he reports occasional episodes of chest discomfort which can occur at rest or with activity and resolves spontaneously. Did notice some improvement with Toprol-XL dose titration. Possible that his symptoms are due to small vessel disease or worsening stress in the setting of his daughter's recent death.  - will further titrate Toprol-XL to 50mg  daily. Continue ASA and statin therapy. Can consider the initiation of Imdur if symptoms persist.   2. HTN - BP is well-controlled at 124/86 during today's visit.   - continue current medication regimen with adjustment of Toprol-XL dosing as outlined above.  3. HLD - followed by PCP. FLP in 09/2016 showed total cholesterol of 128, HDL 34, and LDL 68. At goal of LDL < 70. - continue Atorvastatin 80mg  daily.   4. Tobacco Use - he continues to smoke 0.5 ppd.  - cessation advised.   Medication Adjustments/Labs and Tests Ordered: Current medicines are reviewed at length with the patient today.  Concerns regarding medicines are outlined above.  Medication changes, Labs and Tests ordered today are listed in the Patient Instructions below. Patient  Instructions  Medication Instructions:   INCREASE TOPROL-XL TO 50mg  daily.   If you need a refill on your cardiac medications before your next appointment, please call your pharmacy.  Testing/Procedures: None  Special Instructions: None  Follow-Up: Your physician wants you to follow-up in: 6 months with Dr. Bryna Colander should receive a reminder letter in the mail two months in advance. If you do not receive a letter, please call our office to schedule the follow-up appointment.      Signed, Erma Heritage, PA-C  10/14/2017 8:49 PM    [place] [address] [place], [place] [address] Phone: [phone]

## 2017-10-14 ENCOUNTER — Encounter: Payer: Self-pay | Admitting: Student

## 2017-10-14 ENCOUNTER — Ambulatory Visit: Payer: Medicare Other | Admitting: Student

## 2017-10-14 VITALS — BP 124/86 | HR 73 | Ht 71.0 in | Wt 171.0 lb

## 2017-10-14 DIAGNOSIS — I10 Essential (primary) hypertension: Secondary | ICD-10-CM | POA: Diagnosis not present

## 2017-10-14 DIAGNOSIS — E782 Mixed hyperlipidemia: Secondary | ICD-10-CM | POA: Diagnosis not present

## 2017-10-14 DIAGNOSIS — I25118 Atherosclerotic heart disease of native coronary artery with other forms of angina pectoris: Secondary | ICD-10-CM

## 2017-10-14 DIAGNOSIS — F17200 Nicotine dependence, unspecified, uncomplicated: Secondary | ICD-10-CM

## 2017-10-14 MED ORDER — METOPROLOL SUCCINATE ER 50 MG PO TB24: 50.0000 mg | ORAL_TABLET | Freq: Every day | ORAL | 3 refills | Status: DC

## 2017-10-14 NOTE — Patient Instructions (Signed)
Medication Instructions:   INCREASE TOPROL-XL TO 50mg  daily.   If you need a refill on your cardiac medications before your next appointment, please call your pharmacy.  Testing/Procedures: None  Special Instructions: None  Follow-Up: Your physician wants you to follow-up in: 6 months with Dr. Bryna Colander should receive a reminder letter in the mail two months in advance. If you do not receive a letter, please call our office to schedule the follow-up appointment.   Thank you for choosing [facility] at [facility]!!

## 2017-10-18 ENCOUNTER — Other Ambulatory Visit: Payer: Self-pay | Admitting: *Deleted

## 2017-10-18 ENCOUNTER — Ambulatory Visit: Payer: Medicare Other | Admitting: Gastroenterology

## 2017-10-18 ENCOUNTER — Encounter: Payer: Self-pay | Admitting: Gastroenterology

## 2017-10-18 ENCOUNTER — Encounter: Payer: Self-pay | Admitting: *Deleted

## 2017-10-18 ENCOUNTER — Telehealth: Payer: Self-pay | Admitting: *Deleted

## 2017-10-18 VITALS — BP 127/80 | HR 65 | Temp 96.5°F | Ht 71.0 in | Wt 173.2 lb

## 2017-10-18 DIAGNOSIS — K529 Noninfective gastroenteritis and colitis, unspecified: Secondary | ICD-10-CM

## 2017-10-18 DIAGNOSIS — K625 Hemorrhage of anus and rectum: Secondary | ICD-10-CM

## 2017-10-18 DIAGNOSIS — R1319 Other dysphagia: Secondary | ICD-10-CM

## 2017-10-18 DIAGNOSIS — R1310 Dysphagia, unspecified: Secondary | ICD-10-CM

## 2017-10-18 MED ORDER — PEG 3350-KCL-NA BICARB-NACL 420 G PO SOLR: 4000.0000 mL | Freq: Once | ORAL | 0 refills | Status: AC

## 2017-10-18 NOTE — Progress Notes (Signed)
Primary Care Physician: [name], MD  Primary Gastroenterologist:  Garfield Cornea, MD   Chief Complaint  Patient presents with  . Rectal Bleeding    HPI: [name] is a 61 y.o. male here for further evaluation of rectal bleeding.  Patient last seen in our office in 2017 for the same.  He underwent 3 sessions of hemorrhoid banding back in 2015 with good results.  When he was seen back in 2017 he was having intermittent rectal bleeding.  Decision was made to go ahead with colonoscopy as it been over 2 years since his last one.  Colonoscopy showed abnormal perianal rectal exam, grade 3 hemorrhoids, 2 sessile polyps, one from the cecum with sessile serrated, descending colon polyp was a tubular adenoma.  Advised to have another colonoscopy in 5 years.  Patient states over the last 3 months he is have a lot of issues with rectal bleeding.  He is bleeding more than he ever has in the past due to his hemorrhoids.  New symptom of abdominal cramping prior to rectal bleeding.  Describes passing dark red blood with clots the size of a quarter at times.  Typically his symptoms will begin with abdominal cramping followed by passage of blood per rectum.  He may had several episodes that day and may last for a day or 2.  Then he may go several days with no bleeding noted.  Typically has 4-5 loose stools daily.  Some nocturnal stools.  Complains of postprandial diarrhea.  Cannot recall the last time he had a solid bowel movement.  It is been at least more than a year.  Typical reflux well controlled.  He does note recurrent dysphagia again.  Difficulty swallowing solid foods.  Seems to get stuck in the upper esophagus.  Dilation helped in the past.  Previous work-up of diarrhea in the past have included small bowel biopsies negative for celiac disease, negative random colon biopsies remotely.  Patient has had multiple deaths in the family including mother, mother-in-law, brother and  sister-in-law, most recently his daughter committed suicide back in December 2018.  He is concerned about his own health and admits that he is postponed work up of his own issues in part due to dealing with all of these issues.   Current Outpatient Medications  Medication Sig Dispense Refill  . albuterol (PROVENTIL) (2.5 MG/3ML) 0.083% nebulizer solution Take 2.5 mg by nebulization every 6 (six) hours as needed. For shortness of breath    . ALPRAZolam (XANAX) 1 MG tablet Take 1 tablet (1 mg total) by mouth 4 (four) times daily. 120 tablet 2  . aspirin EC 81 MG tablet Take 81 mg by mouth daily.    Marland Kitchen atorvastatin (LIPITOR) 80 MG tablet Take 1 tablet (80 mg total) by mouth daily. 90 tablet 3  . budesonide-formoterol (SYMBICORT) 80-4.5 MCG/ACT inhaler Inhale 2 puffs into the lungs 2 (two) times daily. 1 Inhaler 12  . FLUoxetine (PROZAC) 20 MG capsule Take 1 capsule (20 mg total) by mouth daily. 90 capsule 2  . FLUoxetine (PROZAC) 40 MG capsule Take 1 capsule (40 mg total) by mouth daily. 90 capsule 2  . fluticasone (FLONASE) 50 MCG/ACT nasal spray Place 2 sprays into both nostrils daily. 16 g 5  . gabapentin (NEURONTIN) 300 MG capsule Take 2 capsules (600 mg total) by mouth 3 (three) times daily. 180 capsule 2  . HYDROcodone-acetaminophen (NORCO) 7.5-325 MG tablet Take 1 tablet by mouth 2 (two) times daily  as needed for moderate pain. 60 tablet 0  . lisinopril (PRINIVIL,ZESTRIL) 2.5 MG tablet TAKE 1 TABLET (2.5 MG TOTAL) BY MOUTH DAILY. **STOP VERAPAMIL** 30 tablet 4  . metoprolol succinate (TOPROL-XL) 50 MG 24 hr tablet Take 1 tablet (50 mg total) by mouth daily. 90 tablet 3  . nitroGLYCERIN (NITROSTAT) 0.4 MG SL tablet Place 0.4 mg under the tongue every 5 (five) minutes as needed for chest pain.    Marland Kitchen OLANZapine (ZYPREXA) 10 MG tablet Take 1 tablet (10 mg total) by mouth at bedtime. 90 tablet 2  . pantoprazole (PROTONIX) 40 MG tablet TAKE 1 TABLET BY MOUTH TWICE A DAY *INS WILL PAY 09/21/15* 180 tablet  1  . promethazine (PHENERGAN) 12.5 MG tablet Take 12.5 mg by mouth every 6 (six) hours as needed for nausea or vomiting. Reported on 10/21/2015    . rOPINIRole (REQUIP) 2 MG tablet TAKE 1 TABLET (2 MG TOTAL) BY MOUTH AT BEDTIME. 90 tablet 2  . temazepam (RESTORIL) 30 MG capsule Take 1 capsule (30 mg total) by mouth at bedtime as needed for sleep. 30 capsule 2  . vitamin B-12 (CYANOCOBALAMIN) 1000 MCG tablet Take 2,000 mcg by mouth daily.     No current facility-administered medications for this visit.     Allergies as of 10/18/2017  . (No Known Allergies)   Past Medical History:  Diagnosis Date  . Anxiety   . Anxiety and depression   . ASCVD (arteriosclerotic cardiovascular disease)    inferior MI with circumflex PTCA in 1993;cath in 2000-50% OM 1&2,30% LAD ;2008-25% LAD, 80% nondominant right ,60% small OM 2,EF of 55% with severe basilar inferior hypokinesis; stress nuclear study in 04/2008 moderate inferior scar with peri-infarction ischemic  . Asthma   . CAD (coronary artery disease)   . Carcinoma in situ of colon 2004   rectal polyp  . Colitis, ischemic ([place]) 2011  . COPD (chronic obstructive pulmonary disease) (HCC)    mild ;excercise induced hypoxemia by cp stress test ;asthma ,bronchitis,  . Depression   . Diverticulosis   . Gastritis 12/30/10   EGD Dr [name]  . GERD (gastroesophageal reflux disease)   . H. pylori infection 2004   treated  . Hemorrhoids   . Hiatal hernia   . Hyperlipidemia   . Hypertension   . Inflammatory polyps of colon with rectal bleeding ([place])   . Leukocytosis    [name]  . Lung nodule 07/16/2011  . Restless leg syndrome   . Schatzki's ring   . Sleep apnea    does not use CPAP  . Syncope   . Tobacco abuse    50 pack years continuing at one halp pack daily  . Tubular adenoma of colon 06/2009   Colonosocpy Dr [name]   Past Surgical History:  Procedure Laterality Date  . CARDIAC CATHETERIZATION    . CHOLECYSTECTOMY  2004  . COLONOSCOPY   06/2009   normal terminal ileum, segmental mild inflammation of sigmoid colon (bx unremarkable), polyp, tubular adenoma  . COLONOSCOPY N/A 07/31/2013   Dr.Rourk- redundant anal canal hemorrhoids, colonic diverticulosis, tubular adenoma  . COLONOSCOPY W/ POLYPECTOMY  2004   rectal polyp with carcinoma in situ removed via colonoscopy  . COLONOSCOPY WITH PROPOFOL N/A 09/15/2015   Dr. [name]: Scattered diverticula throughout the colon, 2 sessile polyps found in the descending colon and cecum, 5 mm in size.  Cecal polyp was sessile serrated polyp, descending colon polyp was a tubular adenoma.  He had a abnormal perianal exam along  with grade 3 hemorrhoids.  Surveillance exam recommended for 5-year follow-up.  . CORONARY ANGIOPLASTY WITH STENT PLACEMENT  01/26/2012   "1; total is now 2"  . ESOPHAGOGASTRODUODENOSCOPY  02/2009   query Barrett's but bx negative  . ESOPHAGOGASTRODUODENOSCOPY  12/30/2010   [name],gastritis, dilated 26F, sm HH, 1 small ulcer, Duodenal erosions, benign bx  . ESOPHAGOGASTRODUODENOSCOPY (EGD) WITH ESOPHAGEAL DILATION N/A 09/12/2012   YQI:[id number] Schatzki's ring s/p Maloney dilator. Small hiatal hernia. negative path  . ESOPHAGOGASTRODUODENOSCOPY (EGD) WITH ESOPHAGEAL DILATION N/A 07/31/2013   Dr. [name]- normal egd, s/p [facility] dilation empirically. Normal small bowel biopsies   . ESOPHAGOGASTRODUODENOSCOPY (EGD) WITH PROPOFOL N/A 09/15/2015   Dr. [name]: Medium sized hiatal hernia, normal-appearing esophagus status post empiric dilation  . FLEXIBLE SIGMOIDOSCOPY  12/30/2010    [name],; internal hemorrhoids, anal papilla  . HAND SURGERY     surgical intervention for injury of the fingers of the left hand many years ago  . heart stent    . HEMORRHOID BANDING     Dr. [name]  . LEFT HEART CATHETERIZATION WITH CORONARY ANGIOGRAM N/A 01/26/2012   Procedure: LEFT HEART CATHETERIZATION WITH CORONARY ANGIOGRAM;  Surgeon: [name], MD;  Location: [facility] CATH LAB;  Service:  Cardiovascular;  Laterality: N/A;  . [name] DILATION N/A 09/15/2015   Procedure: [name] DILATION;  Surgeon: [name], MD;  Location: [facility] ENDO SUITE;  Service: Endoscopy;  Laterality: N/A;  . NASAL SEPTOPLASTY W/ TURBINOPLASTY  10/06/2011   Procedure: NASAL SEPTOPLASTY WITH TURBINATE REDUCTION;  Surgeon: Izora Gala, MD;  Location: [facility];  Service: ENT;  Laterality: Bilateral;  . PERCUTANEOUS CORONARY STENT INTERVENTION (PCI-S) N/A 01/26/2012   Procedure: PERCUTANEOUS CORONARY STENT INTERVENTION (PCI-S);  Surgeon: [name], MD;  Location: [facility] CATH LAB;  Service: Cardiovascular;  Laterality: N/A;  . POLYPECTOMY  09/15/2015   Procedure: POLYPECTOMY;  Surgeon: [name], MD;  Location: [facility] ENDO SUITE;  Service: Endoscopy;;  Cecal polyp removed via cold snare/ Descending colon polyp removed via cold snare   Family History  Problem Relation Age of Onset  . Lung disease Father        deceased, black lung  . Heart disease Mother        blood clots  . Depression Mother   . Cancer Paternal Uncle        unknown type  . Cancer Maternal Aunt        unknown type  . Kidney failure Maternal Uncle   . Hypertension Brother   . Colon cancer Neg Hx   . ADD / ADHD Neg Hx   . Alcohol abuse Neg Hx   . Drug abuse Neg Hx   . Anxiety disorder Neg Hx   . Bipolar disorder Neg Hx   . Dementia Neg Hx   . OCD Neg Hx   . Paranoid behavior Neg Hx   . Schizophrenia Neg Hx   . Physical abuse Neg Hx   . Sexual abuse Neg Hx   . Seizures Neg Hx    Social History   Socioeconomic History  . Marital status: Married    Spouse name: Not on file  . Number of children: 3  . Years of education: Not on file  . Highest education level: Not on file  Occupational History  . Occupation: disable    Employer: RETIRED    Comment: [employer]  Social Needs  . Financial resource strain: Not on file  . Food insecurity:    Worry: Not on  file    Inability: Not on file  . Transportation needs:    Medical: Not on file      Non-medical: Not on file  Tobacco Use  . Smoking status: Current Some Day Smoker    Packs/day: 0.50    Years: 30.00    Pack years: 15.00    Types: Cigarettes    Start date: 07/31/1969  . Smokeless tobacco: Former Systems developer  . Tobacco comment: cutting back trying to quit  Substance and Sexual Activity  . Alcohol use: Yes    Comment: Drinks a beer occasionally  . Drug use: No  . Sexual activity: Not Currently  Lifestyle  . Physical activity:    Days per week: Not on file    Minutes per session: Not on file  . Stress: Not on file  Relationships  . Social connections:    Talks on phone: Not on file    Gets together: Not on file    Attends religious service: Not on file    Active member of club or organization: Not on file    Attends meetings of clubs or organizations: Not on file    Relationship status: Not on file  Other Topics Concern  . Not on file  Social History Narrative   3 stepchildren    ROS:  General: Negative for anorexia, weight loss, fever, chills, fatigue, weakness. ENT: Negative for hoarseness,   nasal congestion. See hpi. CV: Negative for chest pain, angina, palpitations, dyspnea on exertion, peripheral edema.  Respiratory: Negative for dyspnea at rest, dyspnea on exertion, cough, sputum, wheezing.  GI: See history of present illness. GU:  Negative for dysuria, hematuria, urinary incontinence, urinary frequency, nocturnal urination.  Endo: Negative for unusual weight change.    Physical Examination:   BP 127/80   Pulse 65   Temp (!) 96.5 F (35.8 C) (Oral)   Ht 5\' 11"  (1.803 m)   Wt 173 lb 3.2 oz (78.6 kg)   BMI 24.16 kg/m   General: Well-nourished, well-developed in no acute distress.  Eyes: No icterus. Mouth: Oropharyngeal mucosa moist and pink , no lesions erythema or exudate. Lungs: Clear to auscultation bilaterally.  Heart: Regular rate and rhythm, no murmurs rubs or gallops.  Abdomen: Bowel sounds are normal, nontender, nondistended, no  hepatosplenomegaly or masses, no abdominal bruits or hernia , no rebound or guarding.   Extremities: No lower extremity edema. No clubbing or deformities. Neuro: Alert and oriented x 4   Skin: Warm and dry, no jaundice.   Psych: Alert and cooperative, normal mood and affect.  Labs:  Lab Results  Component Value Date         HGB 16.7 08/17/2017                   Imaging Studies: No results found.

## 2017-10-18 NOTE — Progress Notes (Signed)
cc'ed to pcp °

## 2017-10-18 NOTE — Patient Instructions (Signed)
1. Colonoscopy and upper endoscopy with Dr. [name]. See separate instructions.  2. Take our labs to [place] when you go for Dr. Lance Sell labs. Please make sure you tell [facility] that you are having blood work done for [employer].

## 2017-10-18 NOTE — Assessment & Plan Note (Signed)
62 year old gentleman presenting with several month history of increased rectal bleeding associated with abdominal cramping, blood clots, chronic diarrhea.  History of hemorrhoids, underwent banding of 3 sessions 3 banding sessions in 2015 although at time of his last colonoscopy over 2 years ago he had persistent grade 3 hemorrhoid.  Discussed with patient at length, given other issues of abdominal pain and diarrhea, change in rectal bleeding, would offer him a colonoscopy to rule out other etiologies such as IBD, proctitis.  Plan for deep sedation given polypharmacy.  I have discussed the risks, alternatives, benefits with regards to but not limited to the risk of reaction to medication, bleeding, infection, perforation and the patient is agreeable to proceed. Written consent to be obtained.  We will check a CBC in the near future.  If symptoms worsen while waiting for his colonoscopy he will let us know.

## 2017-10-18 NOTE — Telephone Encounter (Signed)
Pre-op scheduled for 12/13/17 Tuesday at 10:00am. Letter mailed. LM to call back

## 2017-10-18 NOTE — Assessment & Plan Note (Signed)
Patient complains of recurrent solid food dysphagia.  Reports EGD with dilation helped significantly 2 years ago.  He desires EGD with dilation at time of colonoscopy which we will try to accommodate given the significant amount of stress patient has been under.  Plan for deep sedation given polypharmacy.  I have discussed the risks, alternatives, benefits with regards to but not limited to the risk of reaction to medication, bleeding, infection, perforation and the patient is agreeable to proceed. Written consent to be obtained.

## 2017-11-15 ENCOUNTER — Ambulatory Visit: Payer: Medicare Other | Admitting: Family Medicine

## 2017-11-15 ENCOUNTER — Encounter: Payer: Self-pay | Admitting: Family Medicine

## 2017-11-15 VITALS — BP 116/74 | Ht 71.0 in | Wt 170.6 lb

## 2017-11-15 DIAGNOSIS — D72828 Other elevated white blood cell count: Secondary | ICD-10-CM

## 2017-11-15 DIAGNOSIS — K625 Hemorrhage of anus and rectum: Secondary | ICD-10-CM | POA: Diagnosis not present

## 2017-11-15 DIAGNOSIS — Z114 Encounter for screening for human immunodeficiency virus [HIV]: Secondary | ICD-10-CM | POA: Diagnosis not present

## 2017-11-15 DIAGNOSIS — E785 Hyperlipidemia, unspecified: Secondary | ICD-10-CM | POA: Diagnosis not present

## 2017-11-15 DIAGNOSIS — Z23 Encounter for immunization: Secondary | ICD-10-CM | POA: Diagnosis not present

## 2017-11-15 DIAGNOSIS — J438 Other emphysema: Secondary | ICD-10-CM

## 2017-11-15 DIAGNOSIS — I10 Essential (primary) hypertension: Secondary | ICD-10-CM

## 2017-11-15 DIAGNOSIS — Z1159 Encounter for screening for other viral diseases: Secondary | ICD-10-CM | POA: Diagnosis not present

## 2017-11-15 DIAGNOSIS — Z125 Encounter for screening for malignant neoplasm of prostate: Secondary | ICD-10-CM | POA: Diagnosis not present

## 2017-11-15 MED ORDER — HYDROCODONE-ACETAMINOPHEN 7.5-325 MG PO TABS: 1.0000 | ORAL_TABLET | Freq: Two times a day (BID) | ORAL | 0 refills | Status: DC | PRN

## 2017-11-15 MED ORDER — LISINOPRIL 2.5 MG PO TABS: ORAL_TABLET | ORAL | 12 refills | Status: DC

## 2017-11-15 NOTE — Progress Notes (Signed)
Subjective:    Patient ID: [name], male    DOB: 11/19/1955, 62 y.o.   MRN: [id number]  HPI  This patient was seen today for chronic pain  The medication list was reviewed and updated.   -Compliance with medication: yes  - Number patient states they take daily:2  -when was the last dose patient took? This am  The patient was advised the importance of maintaining medication and not using illegal substances with these.  Here for refills and follow up  The patient was educated that we can provide 3 monthly scripts for their medication, it is their responsibility to follow the instructions.  Side effects or complications from medications: none  Patient is aware that pain medications are meant to minimize the severity of the pain to allow their pain levels to improve to allow for better function. They are aware of that pain medications cannot totally remove their pain.  Due for UDT ( at least once per year) : 02/2017  Patient for blood pressure check up. Patient relates compliance with meds. Todays BP reviewed with the patient. Patient denies issues with medication. Patient relates reasonable diet. Patient tries to minimize salt. Patient aware of BP goals.  Patient here for follow-up regarding cholesterol.  Patient does try to maintain a reasonable diet.  Patient does take the medication on a regular basis.  Denies missing a dose.  The patient denies any obvious side effects.  Prior blood work results reviewed with the patient.  The patient is aware of his cholesterol goals and the need to keep it under good control to lessen the risk of disease.  Patient does have COPD he still smokes he knows he needs to quit smoking.  Patient has been counseled to quit smoking.  Patient does use his inhalers states it does help  Patient does have a history of intermittent rectal bleeding he is going to be seeing gastroenterology coming up hopefully they will do a colonoscopy  Patient also  with history of a slight elevation white blood count we will see how this is on the follow-up CBC more than likely this is related to his smoking     Review of Systems  Constitutional: Negative for activity change, appetite change and fatigue.  HENT: Negative for congestion and rhinorrhea.   Respiratory: Negative for cough, chest tightness and shortness of breath.   Cardiovascular: Negative for chest pain and leg swelling.  Gastrointestinal: Negative for abdominal pain, diarrhea and nausea.  Endocrine: Negative for polydipsia and polyphagia.  Genitourinary: Negative for dysuria and hematuria.  Neurological: Negative for weakness and headaches.  Psychiatric/Behavioral: Negative for confusion and dysphoric mood.       Objective:   Physical Exam  Constitutional: He appears well-nourished. No distress.  Cardiovascular: Normal rate, regular rhythm and normal heart sounds.  No murmur heard. Pulmonary/Chest: Effort normal and breath sounds normal. No respiratory distress.  Musculoskeletal: He exhibits no edema.  Lymphadenopathy:    He has no cervical adenopathy.  Neurological: He is alert.  Psychiatric: His behavior is normal.  Vitals reviewed.         Assessment & Plan:  HTN- Patient was seen today as part of a visit regarding hypertension. The importance of healthy diet and regular physical activity was discussed. The importance of compliance with medications discussed.  Ideal goal is to keep blood pressure low elevated levels certainly below 841/66 when possible.  The patient was counseled that keeping blood pressure under control lessen his risk of  complications.  The importance of regular follow-ups was discussed with the patient.  Low-salt diet such as DASH recommended.  Regular physical activity was recommended as well.  Patient was advised to keep regular follow-ups.  Patient with underlying COPD.  Still smokes.  He has been counseled to quit smoking.  Patient uses his  medication and does help keep things under reasonable control.  Patient is disabled.  The patient was seen today as part of an evaluation regarding hyperlipidemia.  Recent lab work has been reviewed with the patient as well as the goals for good cholesterol care.  In addition to this medications have been discussed the importance of compliance with diet and medications discussed as well.  Finally the patient is aware that poor control of cholesterol, noncompliance can dramatically increase the risk of complications. The patient will keep regular office visits and the patient does agreed to periodic lab work.  Patient having intermittent rectal bleeding.  He is up-to-date on his colonoscopy but because of ongoing issues will check CBC patient already has appointment with gastroenterology in the coming weeks it is possible that may they may need to do a sigmoidoscopy or colonoscopy patient was warned that if he starts having severe bleeding to go to the emergency department  Screening PSA ordered screening labs for HIV hepatitis C ordered  The patient was seen in followup for chronic pain. A review over at their current pain status was discussed. Drug registry was checked. Prescriptions were given. Discussion was held regarding the importance of compliance with medication as well as pain medication contract.  Time for questions regarding pain management plan occurred. Importance of regular followup visits was discussed. Patient was informed that medication may cause drowsiness and should not be combined  with other medications/alcohol or street drugs. Patient was cautioned that medication could cause drowsiness. If the patient feels medication is causing altered alertness then do not drive or operate dangerous equipment.  Drug registry was checked and verified.  Patient was advised to keep his medication in the same spot.  Patient was also advised to not exceed 2 tablets/day and if he is feeling  drowsy not to operate any machinery

## 2017-11-18 ENCOUNTER — Other Ambulatory Visit (HOSPITAL_COMMUNITY): Payer: Self-pay | Admitting: Psychiatry

## 2017-11-18 DIAGNOSIS — F17200 Nicotine dependence, unspecified, uncomplicated: Secondary | ICD-10-CM

## 2017-11-18 DIAGNOSIS — F418 Other specified anxiety disorders: Secondary | ICD-10-CM

## 2017-11-18 DIAGNOSIS — F5105 Insomnia due to other mental disorder: Secondary | ICD-10-CM

## 2017-11-21 ENCOUNTER — Other Ambulatory Visit: Payer: Self-pay | Admitting: Family Medicine

## 2017-11-22 NOTE — Telephone Encounter (Signed)
Difficult to know, please talk with patient verify the milligrams of the medication he is taking currently

## 2017-11-23 ENCOUNTER — Other Ambulatory Visit: Payer: Self-pay | Admitting: *Deleted

## 2017-11-23 MED ORDER — METOPROLOL SUCCINATE ER 50 MG PO TB24: 50.0000 mg | ORAL_TABLET | Freq: Every day | ORAL | 1 refills | Status: DC

## 2017-11-23 NOTE — Telephone Encounter (Signed)
Please do the 50 mg XL #90 with 1 refill

## 2017-11-23 NOTE — Telephone Encounter (Signed)
Pt states he takes 50mg  daily and he does not know who brittany strader PA is. Wants dr [name] to refill.

## 2017-12-09 NOTE — Patient Instructions (Signed)
[name]  12/09/2017     @PREFPERIOPPHARMACY @   Your procedure is scheduled on  12/19/2017 .  Report to [name] at  615   A.M.  Call this number if you have problems the morning of surgery:  [phone]   Remember:  Do not eat or drink after midnight.  You may drink clear liquids until ( follow the instructions given to you) .  Clear liquids allowed are:                    Water, Juice (non-citric and without pulp), Carbonated beverages, Clear Tea, Black Coffee only, Plain Jell-O only, Gatorade and Plain Popsicles only    Take these medicines the morning of surgery with A SIP OF WATER Xanax, prozac, gabapentin, hydrocodone, lisinopril, metoprolol, protonix, phenergan.    Do not wear jewelry, make-up or nail polish.  Do not wear lotions, powders, or perfumes, or deodorant.  Do not shave 48 hours prior to surgery.  Men may shave face and neck.  Do not bring valuables to the hospital.  [facility] is not responsible for any belongings or valuables.  Contacts, dentures or bridgework may not be worn into surgery.  Leave your suitcase in the car.  After surgery it may be brought to your room.  For patients admitted to the hospital, discharge time will be determined by your treatment team.  Patients discharged the day of surgery will not be allowed to drive home.   Name and phone number of your driver:   family Special instructions:  Follow the diet and prep instructions given to you by Dr Roseanne Kaufman office.  Please [name] over the following fact sheets that you were given. Anesthesia Post-op Instructions and Care and Recovery After Surgery       Esophagogastroduodenoscopy Esophagogastroduodenoscopy (EGD) is a procedure to examine the lining of the esophagus, stomach, and first part of the small intestine (duodenum). This procedure is done to check for problems such as inflammation, bleeding, ulcers, or growths. During this procedure, a long,  flexible, lighted tube with a camera attached (endoscope) is inserted down the throat. Tell a health care provider about:  Any allergies you have.  All medicines you are taking, including vitamins, herbs, eye drops, creams, and over-the-counter medicines.  Any problems you or family members have had with anesthetic medicines.  Any blood disorders you have.  Any surgeries you have had.  Any medical conditions you have.  Whether you are pregnant or may be pregnant. What are the risks? Generally, this is a safe procedure. However, problems may occur, including:  Infection.  Bleeding.  A tear (perforation) in the esophagus, stomach, or duodenum.  Trouble breathing.  Excessive sweating.  Spasms of the larynx.  A slowed heartbeat.  Low blood pressure.  What happens before the procedure?  Follow instructions from your health care provider about eating or drinking restrictions.  Ask your health care provider about: ? Changing or stopping your regular medicines. This is especially important if you are taking diabetes medicines or blood thinners. ? Taking medicines such as aspirin and ibuprofen. These medicines can thin your blood. Do not take these medicines before your procedure if your health care provider instructs you not to.  Plan to have someone take you home after the procedure.  If you wear dentures, be ready to remove them before the procedure. What happens during the procedure?  To reduce your risk of infection, your health care team will wash or sanitize their hands.  An IV tube will be put in a vein in your hand or arm. You will get medicines and fluids through this tube.  You will be given one or more of the following: ? A medicine to help you relax (sedative). ? A medicine to numb the area (local anesthetic). This medicine may be sprayed into your throat. It will make you feel more comfortable and keep you from gagging or coughing during the procedure. ? A  medicine for pain.  A mouth guard may be placed in your mouth to protect your teeth and to keep you from biting on the endoscope.  You will be asked to lie on your left side.  The endoscope will be lowered down your throat into your esophagus, stomach, and duodenum.  Air will be put into the endoscope. This will help your health care provider see better.  The lining of your esophagus, stomach, and duodenum will be examined.  Your health care provider may: ? Take a tissue sample so it can be looked at in a lab (biopsy). ? Remove growths. ? Remove objects (foreign bodies) that are stuck. ? Treat any bleeding with medicines or other devices that stop tissue from bleeding. ? Widen (dilate) or stretch narrowed areas of your esophagus and stomach.  The endoscope will be taken out. The procedure may vary among health care providers and hospitals. What happens after the procedure?  Your blood pressure, heart rate, breathing rate, and blood oxygen level will be monitored often until the medicines you were given have worn off.  Do not eat or drink anything until the numbing medicine has worn off and your gag reflex has returned. This information is not intended to replace advice given to you by your health care provider. Make sure you discuss any questions you have with your health care provider. Document Released: 10/01/2004 Document Revised: 11/06/2015 Document Reviewed: 04/24/2015 Elsevier Interactive Patient Education  2018 [employer]. Esophagogastroduodenoscopy, Care After Refer to this sheet in the next few weeks. These instructions provide you with information about caring for yourself after your procedure. Your health care provider may also give you more specific instructions. Your treatment has been planned according to current medical practices, but problems sometimes occur. Call your health care provider if you have any problems or questions after your procedure. What can I expect  after the procedure? After the procedure, it is common to have:  A sore throat.  Nausea.  Bloating.  Dizziness.  Fatigue.  Follow these instructions at home:  Do not eat or drink anything until the numbing medicine (local anesthetic) has worn off and your gag reflex has returned. You will know that the local anesthetic has worn off when you can swallow comfortably.  Do not drive for 24 hours if you received a medicine to help you relax (sedative).  If your health care provider took a tissue sample for testing during the procedure, make sure to get your test results. This is your responsibility. Ask your health care provider or the department performing the test when your results will be ready.  Keep all follow-up visits as told by your health care provider. This is important. Contact a health care provider if:  You cannot stop coughing.  You are not urinating.  You are urinating less than usual. Get help right away if:  You have trouble swallowing.  You cannot eat or drink.  You  have throat or chest pain that gets worse.  You are dizzy or light-headed.  You faint.  You have nausea or vomiting.  You have chills.  You have a fever.  You have severe abdominal pain.  You have black, tarry, or bloody stools. This information is not intended to replace advice given to you by your health care provider. Make sure you discuss any questions you have with your health care provider. Document Released: 05/17/2012 Document Revised: 11/06/2015 Document Reviewed: 04/24/2015 Elsevier Interactive Patient Education  2018 [employer].  Esophageal Dilatation Esophageal dilatation is a procedure to open a blocked or narrowed part of the esophagus. The esophagus is the long tube in your throat that carries food and liquid from your mouth to your stomach. The procedure is also called esophageal dilation. You may need this procedure if you have a buildup of scar tissue in your  esophagus that makes it difficult, painful, or even impossible to swallow. This can be caused by gastroesophageal reflux disease (GERD). In rare cases, people need this procedure because they have cancer of the esophagus or a problem with the way food moves through the esophagus. Sometimes you may need to have another dilatation to enlarge the opening of the esophagus gradually. Tell a health care provider about:  Any allergies you have.  All medicines you are taking, including vitamins, herbs, eye drops, creams, and over-the-counter medicines.  Any problems you or family members have had with anesthetic medicines.  Any blood disorders you have.  Any surgeries you have had.  Any medical conditions you have.  Any antibiotic medicines you are required to take before dental procedures. What are the risks? Generally, this is a safe procedure. However, problems can occur and include:  Bleeding from a tear in the lining of the esophagus.  A hole (perforation) in the esophagus.  What happens before the procedure?  Do not eat or drink anything after midnight on the night before the procedure or as directed by your health care provider.  Ask your health care provider about changing or stopping your regular medicines. This is especially important if you are taking diabetes medicines or blood thinners.  Plan to have someone take you home after the procedure. What happens during the procedure?  You will be given a medicine that makes you relaxed and sleepy (sedative).  A medicine may be sprayed or gargled to numb the back of the throat.  Your health care provider can use various instruments to do an esophageal dilatation. During the procedure, the instrument used will be placed in your mouth and passed down into your esophagus. Options include: ? Simple dilators. This instrument is carefully placed in the esophagus to stretch it. ? Guided wire bougies. In this method, a flexible tube  (endoscope) is used to insert a wire into the esophagus. The dilator is passed over this wire to enlarge the esophagus. Then the wire is removed. ? Balloon dilators. An endoscope with a small balloon at the end is passed down into the esophagus. Inflating the balloon gently stretches the esophagus and opens it up. What happens after the procedure?  Your blood pressure, heart rate, breathing rate, and blood oxygen level will be monitored often until the medicines you were given have worn off.  Your throat may feel slightly sore and will probably still feel numb. This will improve slowly over time.  You will not be allowed to eat or drink until the throat numbness has resolved.  If this is a  same-day procedure, you may be allowed to go home once you have been able to drink, urinate, and sit on the edge of the bed without nausea or dizziness.  If this is a same-day procedure, you should have a friend or family member with you for the next 24 hours after the procedure. This information is not intended to replace advice given to you by your health care provider. Make sure you discuss any questions you have with your health care provider. Document Released: 07/22/2005 Document Revised: 11/06/2015 Document Reviewed: 10/10/2013 Elsevier Interactive Patient Education  [employer].  Colonoscopy, Adult A colonoscopy is an exam to look at the large intestine. It is done to check for problems, such as:  Lumps (tumors).  Growths (polyps).  Swelling (inflammation).  Bleeding.  What happens before the procedure? Eating and drinking Follow instructions from your doctor about eating and drinking. These instructions may include:  A few days before the procedure - follow a low-fiber diet. ? Avoid nuts. ? Avoid seeds. ? Avoid dried fruit. ? Avoid raw fruits. ? Avoid vegetables.  1-3 days before the procedure - follow a clear liquid diet. Avoid liquids that have red or purple dye. Drink only  clear liquids, such as: ? Clear broth or bouillon. ? Black coffee or tea. ? Clear juice. ? Clear soft drinks or sports drinks. ? Gelatin dessert. ? Popsicles.  On the day of the procedure - do not eat or drink anything during the 2 hours before the procedure.  Bowel prep If you were prescribed an oral bowel prep:  Take it as told by your doctor. Starting the day before your procedure, you will need to drink a lot of liquid. The liquid will cause you to poop (have bowel movements) until your poop is almost clear or light green.  If your skin or butt gets irritated from diarrhea, you may: ? Wipe the area with wipes that have medicine in them, such as adult wet wipes with aloe and vitamin E. ? Put something on your skin that soothes the area, such as petroleum jelly.  If you throw up (vomit) while drinking the bowel prep, take a break for up to 60 minutes. Then begin the bowel prep again. If you keep throwing up and you cannot take the bowel prep without throwing up, call your doctor.  General instructions  Ask your doctor about changing or stopping your normal medicines. This is important if you take diabetes medicines or blood thinners.  Plan to have someone take you home from the hospital or clinic. What happens during the procedure?  An IV tube may be put into one of your veins.  You will be given medicine to help you relax (sedative).  To reduce your risk of infection: ? Your doctors will wash their hands. ? Your anal area will be washed with soap.  You will be asked to lie on your side with your knees bent.  Your doctor will get a long, thin, flexible tube ready. The tube will have a camera and a light on the end.  The tube will be put into your anus.  The tube will be gently put into your large intestine.  Air will be delivered into your large intestine to keep it open. You may feel some pressure or cramping.  The camera will be used to take photos.  A small tissue  sample may be removed from your body to be looked at under a microscope (biopsy). If any possible problems  are found, the tissue will be sent to a lab for testing.  If small growths are found, your doctor may remove them and have them checked for cancer.  The tube that was put into your anus will be slowly removed. The procedure may vary among doctors and hospitals. What happens after the procedure?  Your doctor will check on you often until the medicines you were given have worn off.  Do not drive for 24 hours after the procedure.  You may have a small amount of blood in your poop.  You may pass gas.  You may have mild cramps or bloating in your belly (abdomen).  It is up to you to get the results of your procedure. Ask your doctor, or the department performing the procedure, when your results will be ready. This information is not intended to replace advice given to you by your health care provider. Make sure you discuss any questions you have with your health care provider. Document Released: 07/03/2010 Document Revised: 03/31/2016 Document Reviewed: 08/12/2015 Elsevier Interactive Patient Education  2017 [employer]  Colonoscopy, Adult, Care After This sheet gives you information about how to care for yourself after your procedure. Your health care provider may also give you more specific instructions. If you have problems or questions, contact your health care provider. What can I expect after the procedure? After the procedure, it is common to have:  A small amount of blood in your stool for 24 hours after the procedure.  Some gas.  Mild abdominal cramping or bloating.  Follow these instructions at home: General instructions   For the first 24 hours after the procedure: ? Do not drive or use machinery. ? Do not sign important documents. ? Do not drink alcohol. ? Do your regular daily activities at a slower pace than normal. ? Eat soft, easy-to-digest foods. ? Rest  often.  Take over-the-counter or prescription medicines only as told by your health care provider.  It is up to you to get the results of your procedure. Ask your health care provider, or the department performing the procedure, when your results will be ready. Relieving cramping and bloating  Try walking around when you have cramps or feel bloated.  Apply heat to your abdomen as told by your health care provider. Use a heat source that your health care provider recommends, such as a moist heat pack or a heating pad. ? Place a towel between your skin and the heat source. ? Leave the heat on for 20-30 minutes. ? Remove the heat if your skin turns bright red. This is especially important if you are unable to feel pain, heat, or cold. You may have a greater risk of getting burned. Eating and drinking  Drink enough fluid to keep your urine clear or pale yellow.  Resume your normal diet as instructed by your health care provider. Avoid heavy or fried foods that are hard to digest.  Avoid drinking alcohol for as long as instructed by your health care provider. Contact a health care provider if:  You have blood in your stool 2-3 days after the procedure. Get help right away if:  You have more than a small spotting of blood in your stool.  You pass large blood clots in your stool.  Your abdomen is swollen.  You have nausea or vomiting.  You have a fever.  You have increasing abdominal pain that is not relieved with medicine. This information is not intended to replace advice given to  you by your health care provider. Make sure you discuss any questions you have with your health care provider. Document Released: 01/13/2004 Document Revised: 02/23/2016 Document Reviewed: 08/12/2015 Elsevier Interactive Patient Education  2018 [place] Anesthesia is a term that refers to techniques, procedures, and medicines that help a person stay safe and comfortable  during a medical procedure. Monitored anesthesia care, or sedation, is one type of anesthesia. Your anesthesia specialist may recommend sedation if you will be having a procedure that does not require you to be unconscious, such as:  Cataract surgery.  A dental procedure.  A biopsy.  A colonoscopy.  During the procedure, you may receive a medicine to help you relax (sedative). There are three levels of sedation:  Mild sedation. At this level, you may feel awake and relaxed. You will be able to follow directions.  Moderate sedation. At this level, you will be sleepy. You may not remember the procedure.  Deep sedation. At this level, you will be asleep. You will not remember the procedure.  The more medicine you are given, the deeper your level of sedation will be. Depending on how you respond to the procedure, the anesthesia specialist may change your level of sedation or the type of anesthesia to fit your needs. An anesthesia specialist will monitor you closely during the procedure. Let your health care provider know about:  Any allergies you have.  All medicines you are taking, including vitamins, herbs, eye drops, creams, and over-the-counter medicines.  Any use of steroids (by mouth or as a cream).  Any problems you or family members have had with sedatives and anesthetic medicines.  Any blood disorders you have.  Any surgeries you have had.  Any medical conditions you have, such as sleep apnea.  Whether you are pregnant or may be pregnant.  Any use of cigarettes, alcohol, or street drugs. What are the risks? Generally, this is a safe procedure. However, problems may occur, including:  Getting too much medicine (oversedation).  Nausea.  Allergic reaction to medicines.  Trouble breathing. If this happens, a breathing tube may be used to help with breathing. It will be removed when you are awake and breathing on your own.  Heart trouble.  Lung trouble.  Before  the procedure Staying hydrated Follow instructions from your health care provider about hydration, which may include:  Up to 2 hours before the procedure - you may continue to drink clear liquids, such as water, clear fruit juice, black coffee, and plain tea.  Eating and drinking restrictions Follow instructions from your health care provider about eating and drinking, which may include:  8 hours before the procedure - stop eating heavy meals or foods such as meat, fried foods, or fatty foods.  6 hours before the procedure - stop eating light meals or foods, such as toast or cereal.  6 hours before the procedure - stop drinking milk or drinks that contain milk.  2 hours before the procedure - stop drinking clear liquids.  Medicines Ask your health care provider about:  Changing or stopping your regular medicines. This is especially important if you are taking diabetes medicines or blood thinners.  Taking medicines such as aspirin and ibuprofen. These medicines can thin your blood. Do not take these medicines before your procedure if your health care provider instructs you not to.  Tests and exams  You will have a physical exam.  You may have blood tests done to show: ? How well your  kidneys and liver are working. ? How well your blood can clot.  General instructions  Plan to have someone take you home from the hospital or clinic.  If you will be going home right after the procedure, plan to have someone with you for 24 hours.  What happens during the procedure?  Your blood pressure, heart rate, breathing, level of pain and overall condition will be monitored.  An IV tube will be inserted into one of your veins.  Your anesthesia specialist will give you medicines as needed to keep you comfortable during the procedure. This may mean changing the level of sedation.  The procedure will be performed. After the procedure  Your blood pressure, heart rate, breathing rate, and  blood oxygen level will be monitored until the medicines you were given have worn off.  Do not drive for 24 hours if you received a sedative.  You may: ? Feel sleepy, clumsy, or nauseous. ? Feel forgetful about what happened after the procedure. ? Have a sore throat if you had a breathing tube during the procedure. ? Vomit. This information is not intended to replace advice given to you by your health care provider. Make sure you discuss any questions you have with your health care provider. Document Released: 02/24/2005 Document Revised: 11/07/2015 Document Reviewed: 09/21/2015 Elsevier Interactive Patient Education  2018 [place], Care After These instructions provide you with information about caring for yourself after your procedure. Your health care provider may also give you more specific instructions. Your treatment has been planned according to current medical practices, but problems sometimes occur. Call your health care provider if you have any problems or questions after your procedure. What can I expect after the procedure? After your procedure, it is common to:  Feel sleepy for several hours.  Feel clumsy and have poor balance for several hours.  Feel forgetful about what happened after the procedure.  Have poor judgment for several hours.  Feel nauseous or vomit.  Have a sore throat if you had a breathing tube during the procedure.  Follow these instructions at home: For at least 24 hours after the procedure:   Do not: ? Participate in activities in which you could fall or become injured. ? Drive. ? Use heavy machinery. ? Drink alcohol. ? Take sleeping pills or medicines that cause drowsiness. ? Make important decisions or sign legal documents. ? Take care of children on your own.  Rest. Eating and drinking  Follow the diet that is recommended by your health care provider.  If you vomit, drink water, juice, or soup when you  can drink without vomiting.  Make sure you have little or no nausea before eating solid foods. General instructions  Have a responsible adult stay with you until you are awake and alert.  Take over-the-counter and prescription medicines only as told by your health care provider.  If you smoke, do not smoke without supervision.  Keep all follow-up visits as told by your health care provider. This is important. Contact a health care provider if:  You keep feeling nauseous or you keep vomiting.  You feel light-headed.  You develop a rash.  You have a fever. Get help right away if:  You have trouble breathing. This information is not intended to replace advice given to you by your health care provider. Make sure you discuss any questions you have with your health care provider. Document Released: 09/21/2015 Document Revised: 01/21/2016 Document Reviewed: 09/21/2015 Elsevier Interactive Patient  Education  2018 [employer]  

## 2017-12-12 ENCOUNTER — Ambulatory Visit (HOSPITAL_COMMUNITY): Payer: Medicare Other | Admitting: Psychiatry

## 2017-12-13 ENCOUNTER — Ambulatory Visit: Payer: Self-pay | Admitting: Internal Medicine

## 2017-12-13 ENCOUNTER — Encounter (HOSPITAL_COMMUNITY): Payer: Self-pay

## 2017-12-13 ENCOUNTER — Encounter (HOSPITAL_COMMUNITY)
Admission: RE | Admit: 2017-12-13 | Discharge: 2017-12-13 | Disposition: A | Discharge status: Home / Self Care | Payer: Medicare Other | Source: Ambulatory Visit | Attending: Internal Medicine | Admitting: Internal Medicine

## 2017-12-14 ENCOUNTER — Encounter: Payer: Self-pay | Admitting: Family Medicine

## 2017-12-14 ENCOUNTER — Telehealth: Payer: Self-pay

## 2017-12-14 ENCOUNTER — Telehealth: Payer: Self-pay | Admitting: *Deleted

## 2017-12-14 LAB — HEPATIC FUNCTION PANEL
ALBUMIN: 4.3 g/dL (ref 3.6–4.8)
ALK PHOS: 89 IU/L (ref 39–117)
ALT: 36 IU/L (ref 0–44)
AST: 21 IU/L (ref 0–40)
BILIRUBIN TOTAL: 0.7 mg/dL (ref 0.0–1.2)
BILIRUBIN, DIRECT: 0.17 mg/dL (ref 0.00–0.40)
Total Protein: 6.5 g/dL (ref 6.0–8.5)

## 2017-12-14 LAB — BASIC METABOLIC PANEL
BUN / CREAT RATIO: 9 — AB (ref 10–24)
BUN: 7 mg/dL — ABNORMAL LOW (ref 8–27)
CO2: 23 mmol/L (ref 20–29)
CREATININE: 0.74 mg/dL — AB (ref 0.76–1.27)
Calcium: 9.2 mg/dL (ref 8.6–10.2)
Chloride: 103 mmol/L (ref 96–106)
GFR calc Af Amer: 115 mL/min/{1.73_m2} (ref >59)
GFR, EST NON AFRICAN AMERICAN: 100 mL/min/{1.73_m2} (ref >59)
GLUCOSE: 112 mg/dL — AB (ref 65–99)
Potassium: 3.9 mmol/L (ref 3.5–5.2)
SODIUM: 143 mmol/L (ref 134–144)

## 2017-12-14 LAB — CBC WITH DIFFERENTIAL/PLATELET
BASOS: 0 %
Basophils Absolute: 0 10*3/uL (ref 0.0–0.2)
EOS (ABSOLUTE): 0.1 10*3/uL (ref 0.0–0.4)
Eos: 1 %
Hematocrit: 41.8 % (ref 37.5–51.0)
Hemoglobin: 14.8 g/dL (ref 13.0–17.7)
Immature Grans (Abs): 0 10*3/uL (ref 0.0–0.1)
Immature Granulocytes: 0 %
LYMPHS ABS: 2.4 10*3/uL (ref 0.7–3.1)
Lymphs: 30 %
MCH: 32.5 pg (ref 26.6–33.0)
MCHC: 35.4 g/dL (ref 31.5–35.7)
MCV: 92 fL (ref 79–97)
Monocytes Absolute: 0.3 10*3/uL (ref 0.1–0.9)
Monocytes: 4 %
NEUTROS ABS: 5.2 10*3/uL (ref 1.4–7.0)
NEUTROS PCT: 65 %
PLATELETS: 186 10*3/uL (ref 150–450)
RBC: 4.55 x10E6/uL (ref 4.14–5.80)
RDW: 13.9 % (ref 12.3–15.4)
WBC: 8 10*3/uL (ref 3.4–10.8)

## 2017-12-14 LAB — HIV ANTIBODY (ROUTINE TESTING W REFLEX): HIV Screen 4th Generation wRfx: NONREACTIVE

## 2017-12-14 LAB — LIPID PANEL
CHOL/HDL RATIO: 4.1 ratio (ref 0.0–5.0)
Cholesterol, Total: 138 mg/dL (ref 100–199)
HDL: 34 mg/dL — ABNORMAL LOW (ref >39)
LDL CALC: 43 mg/dL (ref 0–99)
Triglycerides: 304 mg/dL — ABNORMAL HIGH (ref 0–149)
VLDL CHOLESTEROL CAL: 61 mg/dL — AB (ref 5–40)

## 2017-12-14 LAB — HEPATITIS C ANTIBODY: Hep C Virus Ab: <0.1 s/co ratio (ref 0.0–0.9)

## 2017-12-14 LAB — PSA: PROSTATE SPECIFIC AG, SERUM: 0.9 ng/mL (ref 0.0–4.0)

## 2017-12-14 NOTE — Telephone Encounter (Addendum)
Noted. Reviewed labs.   Lab Results  Component Value Date   WBC 8.0 12/13/2017   HGB 14.8 12/13/2017   HCT 41.8 12/13/2017   MCV 92 12/13/2017   PLT 186 12/13/2017   Lab Results  Component Value Date   ALT 36 12/13/2017   AST 21 12/13/2017   ALKPHOS 89 12/13/2017   BILITOT 0.7 12/13/2017   Lab Results  Component Value Date   CREATININE 0.74 (L) 12/13/2017   BUN 7 (L) 12/13/2017   NA 143 12/13/2017   K 3.9 12/13/2017   CL 103 12/13/2017   CO2 23 12/13/2017

## 2017-12-14 NOTE — Telephone Encounter (Signed)
Received [employer] results via fax ( they are in epic). Orders were from Dr. [name] and Neil Crouch, [place].

## 2017-12-14 NOTE — Telephone Encounter (Signed)
Received call from [name], patient did not show up for pre-op today.   Spoke with patient and he stated he had to pick up his son from out of town. Spoke with [name] and she can do pre-op friday 12/16/17 at 1:00pm. Patient is aware of new appt for pre-op. Nothing further needed

## 2017-12-16 ENCOUNTER — Encounter (HOSPITAL_COMMUNITY): Payer: Self-pay

## 2017-12-16 ENCOUNTER — Encounter (HOSPITAL_COMMUNITY)
Admission: RE | Admit: 2017-12-16 | Discharge: 2017-12-16 | Disposition: A | Discharge status: Home / Self Care | Payer: Medicare Other | Source: Ambulatory Visit | Attending: Internal Medicine | Admitting: Internal Medicine

## 2017-12-16 ENCOUNTER — Other Ambulatory Visit: Payer: Self-pay

## 2017-12-16 DIAGNOSIS — F17210 Nicotine dependence, cigarettes, uncomplicated: Secondary | ICD-10-CM | POA: Diagnosis not present

## 2017-12-16 DIAGNOSIS — Z79899 Other long term (current) drug therapy: Secondary | ICD-10-CM | POA: Diagnosis not present

## 2017-12-16 DIAGNOSIS — E785 Hyperlipidemia, unspecified: Secondary | ICD-10-CM | POA: Diagnosis not present

## 2017-12-16 DIAGNOSIS — F329 Major depressive disorder, single episode, unspecified: Secondary | ICD-10-CM | POA: Diagnosis not present

## 2017-12-16 DIAGNOSIS — K921 Melena: Secondary | ICD-10-CM | POA: Diagnosis present

## 2017-12-16 DIAGNOSIS — K641 Second degree hemorrhoids: Secondary | ICD-10-CM | POA: Diagnosis not present

## 2017-12-16 DIAGNOSIS — K2950 Unspecified chronic gastritis without bleeding: Secondary | ICD-10-CM | POA: Diagnosis not present

## 2017-12-16 DIAGNOSIS — J449 Chronic obstructive pulmonary disease, unspecified: Secondary | ICD-10-CM | POA: Diagnosis not present

## 2017-12-16 DIAGNOSIS — G4730 Sleep apnea, unspecified: Secondary | ICD-10-CM | POA: Diagnosis not present

## 2017-12-16 DIAGNOSIS — I252 Old myocardial infarction: Secondary | ICD-10-CM | POA: Diagnosis not present

## 2017-12-16 DIAGNOSIS — Z955 Presence of coronary angioplasty implant and graft: Secondary | ICD-10-CM | POA: Diagnosis not present

## 2017-12-16 DIAGNOSIS — D122 Benign neoplasm of ascending colon: Secondary | ICD-10-CM | POA: Diagnosis not present

## 2017-12-16 DIAGNOSIS — R1310 Dysphagia, unspecified: Secondary | ICD-10-CM | POA: Diagnosis not present

## 2017-12-16 DIAGNOSIS — I10 Essential (primary) hypertension: Secondary | ICD-10-CM | POA: Diagnosis not present

## 2017-12-16 DIAGNOSIS — Z86010 Personal history of colonic polyps: Secondary | ICD-10-CM | POA: Diagnosis not present

## 2017-12-16 DIAGNOSIS — K219 Gastro-esophageal reflux disease without esophagitis: Secondary | ICD-10-CM | POA: Diagnosis not present

## 2017-12-16 DIAGNOSIS — I2510 Atherosclerotic heart disease of native coronary artery without angina pectoris: Secondary | ICD-10-CM | POA: Diagnosis not present

## 2017-12-16 DIAGNOSIS — G2581 Restless legs syndrome: Secondary | ICD-10-CM | POA: Diagnosis not present

## 2017-12-16 DIAGNOSIS — Z7982 Long term (current) use of aspirin: Secondary | ICD-10-CM | POA: Diagnosis not present

## 2017-12-16 DIAGNOSIS — F419 Anxiety disorder, unspecified: Secondary | ICD-10-CM | POA: Diagnosis not present

## 2017-12-16 LAB — CBC WITH DIFFERENTIAL/PLATELET
BASOS PCT: 0 %
Basophils Absolute: 0 10*3/uL (ref 0.0–0.1)
Eosinophils Absolute: 0.1 10*3/uL (ref 0.0–0.7)
Eosinophils Relative: 1 %
HEMATOCRIT: 46.8 % (ref 39.0–52.0)
Hemoglobin: 16.7 g/dL (ref 13.0–17.0)
LYMPHS ABS: 2.7 10*3/uL (ref 0.7–4.0)
LYMPHS PCT: 28 %
MCH: 34.3 pg — AB (ref 26.0–34.0)
MCHC: 35.7 g/dL (ref 30.0–36.0)
MCV: 96.1 fL (ref 78.0–100.0)
MONOS PCT: 4 %
Monocytes Absolute: 0.4 10*3/uL (ref 0.1–1.0)
NEUTROS ABS: 6.4 10*3/uL (ref 1.7–7.7)
Neutrophils Relative %: 67 %
Platelets: 161 10*3/uL (ref 150–400)
RBC: 4.87 MIL/uL (ref 4.22–5.81)
RDW: 13.4 % (ref 11.5–15.5)
WBC: 9.5 10*3/uL (ref 4.0–10.5)

## 2017-12-16 LAB — BASIC METABOLIC PANEL
Anion gap: 11 (ref 5–15)
BUN: 13 mg/dL (ref 8–23)
CALCIUM: 9 mg/dL (ref 8.9–10.3)
CO2: 24 mmol/L (ref 22–32)
Chloride: 108 mmol/L (ref 98–111)
Creatinine, Ser: 0.76 mg/dL (ref 0.61–1.24)
GFR calc non Af Amer: >60 mL/min (ref >60)
Glucose, Bld: 90 mg/dL (ref 70–99)
Potassium: 4 mmol/L (ref 3.5–5.1)
SODIUM: 143 mmol/L (ref 135–145)

## 2017-12-19 ENCOUNTER — Encounter (HOSPITAL_COMMUNITY)
Admission: RE | Disposition: A | Discharge status: Home / Self Care | Payer: Self-pay | Source: Ambulatory Visit | Attending: Internal Medicine

## 2017-12-19 ENCOUNTER — Encounter (HOSPITAL_COMMUNITY): Payer: Self-pay | Admitting: *Deleted

## 2017-12-19 ENCOUNTER — Ambulatory Visit (HOSPITAL_COMMUNITY): Payer: Medicare Other | Admitting: Certified Registered"

## 2017-12-19 ENCOUNTER — Ambulatory Visit (HOSPITAL_COMMUNITY)
Admission: RE | Admit: 2017-12-19 | Discharge: 2017-12-19 | Disposition: A | Discharge status: Home / Self Care | Payer: Medicare Other | Source: Ambulatory Visit | Attending: Internal Medicine | Admitting: Internal Medicine

## 2017-12-19 DIAGNOSIS — F329 Major depressive disorder, single episode, unspecified: Secondary | ICD-10-CM | POA: Insufficient documentation

## 2017-12-19 DIAGNOSIS — Z7982 Long term (current) use of aspirin: Secondary | ICD-10-CM | POA: Insufficient documentation

## 2017-12-19 DIAGNOSIS — G4730 Sleep apnea, unspecified: Secondary | ICD-10-CM | POA: Insufficient documentation

## 2017-12-19 DIAGNOSIS — F17210 Nicotine dependence, cigarettes, uncomplicated: Secondary | ICD-10-CM | POA: Insufficient documentation

## 2017-12-19 DIAGNOSIS — K921 Melena: Secondary | ICD-10-CM

## 2017-12-19 DIAGNOSIS — K219 Gastro-esophageal reflux disease without esophagitis: Secondary | ICD-10-CM | POA: Insufficient documentation

## 2017-12-19 DIAGNOSIS — E785 Hyperlipidemia, unspecified: Secondary | ICD-10-CM | POA: Insufficient documentation

## 2017-12-19 DIAGNOSIS — K3189 Other diseases of stomach and duodenum: Secondary | ICD-10-CM

## 2017-12-19 DIAGNOSIS — D122 Benign neoplasm of ascending colon: Secondary | ICD-10-CM | POA: Insufficient documentation

## 2017-12-19 DIAGNOSIS — K209 Esophagitis, unspecified: Secondary | ICD-10-CM | POA: Diagnosis not present

## 2017-12-19 DIAGNOSIS — F419 Anxiety disorder, unspecified: Secondary | ICD-10-CM | POA: Insufficient documentation

## 2017-12-19 DIAGNOSIS — Z79899 Other long term (current) drug therapy: Secondary | ICD-10-CM | POA: Insufficient documentation

## 2017-12-19 DIAGNOSIS — K2950 Unspecified chronic gastritis without bleeding: Secondary | ICD-10-CM | POA: Insufficient documentation

## 2017-12-19 DIAGNOSIS — K641 Second degree hemorrhoids: Secondary | ICD-10-CM | POA: Insufficient documentation

## 2017-12-19 DIAGNOSIS — I252 Old myocardial infarction: Secondary | ICD-10-CM | POA: Insufficient documentation

## 2017-12-19 DIAGNOSIS — G2581 Restless legs syndrome: Secondary | ICD-10-CM | POA: Insufficient documentation

## 2017-12-19 DIAGNOSIS — I2510 Atherosclerotic heart disease of native coronary artery without angina pectoris: Secondary | ICD-10-CM | POA: Insufficient documentation

## 2017-12-19 DIAGNOSIS — J449 Chronic obstructive pulmonary disease, unspecified: Secondary | ICD-10-CM | POA: Insufficient documentation

## 2017-12-19 DIAGNOSIS — R1310 Dysphagia, unspecified: Secondary | ICD-10-CM | POA: Diagnosis not present

## 2017-12-19 DIAGNOSIS — I10 Essential (primary) hypertension: Secondary | ICD-10-CM | POA: Insufficient documentation

## 2017-12-19 DIAGNOSIS — Z955 Presence of coronary angioplasty implant and graft: Secondary | ICD-10-CM | POA: Insufficient documentation

## 2017-12-19 DIAGNOSIS — Z86010 Personal history of colonic polyps: Secondary | ICD-10-CM | POA: Insufficient documentation

## 2017-12-19 HISTORY — PX: MALONEY DILATION: SHX5535

## 2017-12-19 HISTORY — PX: COLONOSCOPY WITH PROPOFOL: SHX5780

## 2017-12-19 HISTORY — PX: ESOPHAGOGASTRODUODENOSCOPY (EGD) WITH PROPOFOL: SHX5813

## 2017-12-19 HISTORY — PX: POLYPECTOMY: SHX5525

## 2017-12-19 HISTORY — PX: BIOPSY: SHX5522

## 2017-12-19 SURGERY — COLONOSCOPY WITH PROPOFOL
Anesthesia: Monitor Anesthesia Care

## 2017-12-19 MED ORDER — PROPOFOL 10 MG/ML IV BOLUS
INTRAVENOUS | Status: AC
  Filled 2017-12-19: qty 40

## 2017-12-19 MED ORDER — HYDROMORPHONE HCL 1 MG/ML IJ SOLN: 0.2500 mg | INTRAMUSCULAR | Status: DC | PRN

## 2017-12-19 MED ORDER — LIDOCAINE HCL (PF) 1 % IJ SOLN
INTRAMUSCULAR | Status: AC
Start: 2017-12-19 — End: ?
  Filled 2017-12-19: qty 5

## 2017-12-19 MED ORDER — LIDOCAINE VISCOUS HCL 2 % MT SOLN
OROMUCOSAL | Status: DC | PRN
  Administered 2017-12-19: 6 mL via OROMUCOSAL

## 2017-12-19 MED ORDER — LACTATED RINGERS IV SOLN
INTRAVENOUS | Status: DC
  Administered 2017-12-19: 07:00:00 via INTRAVENOUS

## 2017-12-19 MED ORDER — LIDOCAINE VISCOUS HCL 2 % MT SOLN
OROMUCOSAL | Status: AC
Start: 2017-12-19 — End: ?
  Filled 2017-12-19: qty 15

## 2017-12-19 MED ORDER — HYDROCODONE-ACETAMINOPHEN 7.5-325 MG PO TABS: 1.0000 | ORAL_TABLET | Freq: Once | ORAL | Status: DC | PRN

## 2017-12-19 MED ORDER — LIDOCAINE HCL (PF) 1 % IJ SOLN
INTRAMUSCULAR | Status: DC | PRN
  Administered 2017-12-19: 20 mg

## 2017-12-19 MED ORDER — MEPERIDINE HCL 100 MG/ML IJ SOLN: 6.2500 mg | INTRAMUSCULAR | Status: DC | PRN

## 2017-12-19 MED ORDER — LACTATED RINGERS IV SOLN: INTRAVENOUS | Status: DC

## 2017-12-19 MED ORDER — PROPOFOL 10 MG/ML IV BOLUS
INTRAVENOUS | Status: DC | PRN
  Administered 2017-12-19 (×5): 50 mg via INTRAVENOUS
  Administered 2017-12-19: 20 mg via INTRAVENOUS
  Administered 2017-12-19: 50 mg via INTRAVENOUS
  Administered 2017-12-19: 80 mg via INTRAVENOUS

## 2017-12-19 MED ORDER — PROMETHAZINE HCL 25 MG/ML IJ SOLN: 6.2500 mg | INTRAMUSCULAR | Status: DC | PRN

## 2017-12-19 NOTE — Anesthesia Postprocedure Evaluation (Signed)
Anesthesia Post Note  Patient: DUFF POZZI  Procedure(s) Performed: COLONOSCOPY WITH PROPOFOL (N/A ) ESOPHAGOGASTRODUODENOSCOPY (EGD) WITH PROPOFOL (N/A ) MALONEY DILATION (N/A ) BIOPSY POLYPECTOMY  Anesthesia Type: MAC     Last Vitals:  Vitals:   12/19/17 0830 12/19/17 0840  BP: 112/71 119/72  Pulse: 68 63  Resp: 17 18  Temp:  36.6 C  SpO2: 98% 96%    Last Pain:  Vitals:   12/19/17 0840  TempSrc: Oral  PainSc: 0-No pain                 Kitt Ledet Terri Piedra

## 2017-12-19 NOTE — H&P (Signed)
@LOGO @   Primary Care Physician:  [name], MD Primary Gastroenterologist:  Dr. [name]  Pre-Procedure History & Physical: HPI:  [name] is a 62 y.o. male here for further evaluation of dysphagia via EGD. Also here for colonoscopy to further evaluate rectal bleeding.  Past Medical History:  Diagnosis Date  . Anxiety   . Anxiety and depression   . ASCVD (arteriosclerotic cardiovascular disease)    inferior MI with circumflex PTCA in 1993;cath in 2000-50% OM 1&2,30% LAD ;2008-25% LAD, 80% nondominant right ,60% small OM 2,EF of 55% with severe basilar inferior hypokinesis; stress nuclear study in 04/2008 moderate inferior scar with peri-infarction ischemic  . Asthma   . CAD (coronary artery disease)   . Carcinoma in situ of colon 2004   rectal polyp  . Colitis, ischemic ([place]) 2011  . COPD (chronic obstructive pulmonary disease) (HCC)    mild ;excercise induced hypoxemia by cp stress test ;asthma ,bronchitis,  . Depression   . Diverticulosis   . Gastritis 12/30/10   EGD Dr [name]  . GERD (gastroesophageal reflux disease)   . H. pylori infection 2004   treated  . Hemorrhoids   . Hiatal hernia   . Hyperlipidemia   . Hypertension   . Inflammatory polyps of colon with rectal bleeding ([place])   . Leukocytosis    [name]  . Lung nodule 07/16/2011  . Myocardial infarction [facility])    age 61  . Restless leg syndrome   . Schatzki's ring   . Sleep apnea    does not use CPAP:cannot tolerate, PCP aware  . Syncope   . Tobacco abuse    50 pack years continuing at one halp pack daily  . Tubular adenoma of colon 06/2009   Colonosocpy Dr [name]    Past Surgical History:  Procedure Laterality Date  . CARDIAC CATHETERIZATION    . CHOLECYSTECTOMY  2004  . COLONOSCOPY  06/2009   normal terminal ileum, segmental mild inflammation of sigmoid colon (bx unremarkable), polyp, tubular adenoma  . COLONOSCOPY N/A 07/31/2013   Dr.Rourk- redundant anal canal hemorrhoids, colonic  diverticulosis, tubular adenoma  . COLONOSCOPY W/ POLYPECTOMY  2004   rectal polyp with carcinoma in situ removed via colonoscopy  . COLONOSCOPY WITH PROPOFOL N/A 09/15/2015   Dr. [name]: Scattered diverticula throughout the colon, 2 sessile polyps found in the descending colon and cecum, 5 mm in size.  Cecal polyp was sessile serrated polyp, descending colon polyp was a tubular adenoma.  He had a abnormal perianal exam along with grade 3 hemorrhoids.  Surveillance exam recommended for 5-year follow-up.  . CORONARY ANGIOPLASTY WITH STENT PLACEMENT  01/26/2012   "1; total is now 2"  . ESOPHAGOGASTRODUODENOSCOPY  02/2009   query Barrett's but bx negative  . ESOPHAGOGASTRODUODENOSCOPY  12/30/2010   [name],gastritis, dilated 29F, sm HH, 1 small ulcer, Duodenal erosions, benign bx  . ESOPHAGOGASTRODUODENOSCOPY (EGD) WITH ESOPHAGEAL DILATION N/A 09/12/2012   YQM:[id number] Schatzki's ring s/p Maloney dilator. Small hiatal hernia. negative path  . ESOPHAGOGASTRODUODENOSCOPY (EGD) WITH ESOPHAGEAL DILATION N/A 07/31/2013   Dr. [name]- normal egd, s/p [facility] dilation empirically. Normal small bowel biopsies   . ESOPHAGOGASTRODUODENOSCOPY (EGD) WITH PROPOFOL N/A 09/15/2015   Dr. [name]: Medium sized hiatal hernia, normal-appearing esophagus status post empiric dilation  . FLEXIBLE SIGMOIDOSCOPY  12/30/2010    [name],; internal hemorrhoids, anal papilla  . HAND SURGERY     surgical intervention for injury of the fingers of the left hand many years ago  .  heart stent    . HEMORRHOID BANDING     Dr. [name]  . LEFT HEART CATHETERIZATION WITH CORONARY ANGIOGRAM N/A 01/26/2012   Procedure: LEFT HEART CATHETERIZATION WITH CORONARY ANGIOGRAM;  Surgeon: [name], MD;  Location: [facility] CATH LAB;  Service: Cardiovascular;  Laterality: N/A;  . [name] DILATION N/A 09/15/2015   Procedure: [name] DILATION;  Surgeon: [name], MD;  Location: [facility] ENDO SUITE;  Service: Endoscopy;  Laterality: N/A;  . NASAL  SEPTOPLASTY W/ TURBINOPLASTY  10/06/2011   Procedure: NASAL SEPTOPLASTY WITH TURBINATE REDUCTION;  Surgeon: Izora Gala, MD;  Location: [place];  Service: ENT;  Laterality: Bilateral;  . PERCUTANEOUS CORONARY STENT INTERVENTION (PCI-S) N/A 01/26/2012   Procedure: PERCUTANEOUS CORONARY STENT INTERVENTION (PCI-S);  Surgeon: [name], MD;  Location: [facility] CATH LAB;  Service: Cardiovascular;  Laterality: N/A;  . POLYPECTOMY  09/15/2015   Procedure: POLYPECTOMY;  Surgeon: [name], MD;  Location: [facility] ENDO SUITE;  Service: Endoscopy;;  Cecal polyp removed via cold snare/ Descending colon polyp removed via cold snare    Prior to Admission medications   Medication Sig Start Date End Date Taking? Authorizing Provider  ALPRAZolam Duanne Moron) 1 MG tablet Take 1 tablet (1 mg total) by mouth 4 (four) times daily. 10/06/17  Yes Cloria Spring, MD  aspirin EC 81 MG tablet Take 81 mg by mouth daily.   Yes [provider]  atorvastatin (LIPITOR) 80 MG tablet Take 1 tablet (80 mg total) by mouth daily. 05/30/17  Yes BranchAlphonse Guild, MD  budesonide-formoterol (SYMBICORT) 80-4.5 MCG/ACT inhaler Inhale 2 puffs into the lungs 2 (two) times daily. 07/22/16  Yes [name], MD  FLUoxetine (PROZAC) 20 MG capsule Take 1 capsule (20 mg total) by mouth daily. 10/06/17  Yes Cloria Spring, MD  FLUoxetine (PROZAC) 40 MG capsule Take 1 capsule (40 mg total) by mouth daily. 10/06/17 10/06/18 Yes Cloria Spring, MD  fluticasone [facility]) 50 MCG/ACT nasal spray Place 2 sprays into both nostrils daily. Patient taking differently: Place 2 sprays into both nostrils daily as needed for allergies.  10/19/16  Yes Luking, Scott A, MD  gabapentin (NEURONTIN) 300 MG capsule TAKE 2 CAPSULES BY MOUTH 3 TIMES DAILY. 11/21/17  Yes Cloria Spring, MD  HYDROcodone-acetaminophen (NORCO) 7.5-325 MG tablet Take 1 tablet by mouth 2 (two) times daily as needed for moderate pain. 11/15/17  Yes Luking, Scott A, MD  lisinopril  (PRINIVIL,ZESTRIL) 2.5 MG tablet TAKE 1 TABLET (2.5 MG TOTAL) BY MOUTH DAILY. **STOP VERAPAMIL** 11/15/17  Yes [name], MD  metoprolol succinate (TOPROL-XL) 50 MG 24 hr tablet Take 1 tablet (50 mg total) by mouth daily. 11/23/17  Yes Luking, Elayne Snare, MD  nitroGLYCERIN (NITROSTAT) 0.4 MG SL tablet Place 0.4 mg under the tongue every 5 (five) minutes as needed for chest pain.   Yes [provider]  OLANZapine (ZYPREXA) 10 MG tablet Take 1 tablet (10 mg total) by mouth at bedtime. 10/06/17 10/06/18 Yes Cloria Spring, MD  rOPINIRole (REQUIP) 2 MG tablet TAKE 1 TABLET (2 MG TOTAL) BY MOUTH AT BEDTIME. 07/18/17  Yes Luking, Scott A, MD  temazepam (RESTORIL) 30 MG capsule Take 1 capsule (30 mg total) by mouth at bedtime as needed for sleep. 10/06/17  Yes Cloria Spring, MD  albuterol (PROVENTIL) (2.5 MG/3ML) 0.083% nebulizer solution Take 2.5 mg by nebulization every 6 (six) hours as needed for wheezing or shortness of breath.     [provider]  pantoprazole (PROTONIX) 40  MG tablet TAKE 1 TABLET BY MOUTH TWICE A DAY *INS WILL PAY 09/21/15* Patient not taking: Reported on 12/12/2017 01/20/17   Annitta Needs, NP  amLODipine (NORVASC) 5 MG tablet Take 5 mg by mouth daily.   08/31/11  [provider]    Allergies as of 10/18/2017  . (No Known Allergies)    Family History  Problem Relation Age of Onset  . Lung disease Father        deceased, black lung  . Heart disease Mother        blood clots  . Depression Mother   . Cancer Paternal Uncle        unknown type  . Cancer Maternal Aunt        unknown type  . Kidney failure Maternal Uncle   . Hypertension Brother   . Colon cancer Neg Hx   . ADD / ADHD Neg Hx   . Alcohol abuse Neg Hx   . Drug abuse Neg Hx   . Anxiety disorder Neg Hx   . Bipolar disorder Neg Hx   . Dementia Neg Hx   . OCD Neg Hx   . Paranoid behavior Neg Hx   . Schizophrenia Neg Hx   . Physical abuse Neg Hx   . Sexual abuse Neg Hx   . Seizures Neg Hx      Social History   Socioeconomic History  . Marital status: Married    Spouse name: Not on file  . Number of children: 3  . Years of education: Not on file  . Highest education level: Not on file  Occupational History  . Occupation: disable    Employer: RETIRED    Comment: [employer]  Social Needs  . Financial resource strain: Not on file  . Food insecurity:    Worry: Not on file    Inability: Not on file  . Transportation needs:    Medical: Not on file    Non-medical: Not on file  Tobacco Use  . Smoking status: Current Some Day Smoker    Packs/day: 0.50    Years: 30.00    Pack years: 15.00    Types: Cigarettes    Start date: 07/31/1969  . Smokeless tobacco: Former Systems developer  . Tobacco comment: cutting back trying to quit  Substance and Sexual Activity  . Alcohol use: Yes    Comment: Drinks a beer occasionally  . Drug use: No  . Sexual activity: Not Currently  Lifestyle  . Physical activity:    Days per week: Not on file    Minutes per session: Not on file  . Stress: Not on file  Relationships  . Social connections:    Talks on phone: Not on file    Gets together: Not on file    Attends religious service: Not on file    Active member of club or organization: Not on file    Attends meetings of clubs or organizations: Not on file    Relationship status: Not on file  . Intimate partner violence:    Fear of current or ex partner: Not on file    Emotionally abused: Not on file    Physically abused: Not on file    Forced sexual activity: Not on file  Other Topics Concern  . Not on file  Social History Narrative   3 stepchildren    Review of Systems: See HPI, otherwise negative ROS  Physical Exam: BP 120/79   Pulse 68   Temp 98.3 F (  36.8 C) (Oral)   Resp 18   SpO2 94%  General:   Alert,  Well-developed, well-nourished, pleasant and cooperative in NAD Skin:  Intact without significant lesions or rashes. Neck:  Supple; no masses or thyromegaly. No significant  cervical adenopathy. Lungs:  Clear throughout to auscultation.   No wheezes, crackles, or rhonchi. No acute distress. Heart:  Regular rate and rhythm; no murmurs, clicks, rubs,  or gallops. Abdomen: Non-distended, normal bowel sounds.  Soft and nontender without appreciable mass or hepatosplenomegaly.  Pulses:  Normal pulses noted. Extremities:  Without clubbing or edema.  Impression plan:  62 year old gentleman is esophageal dysphagia and rectal bleeding. Here for EGD with possible esophageal dilation diagnostic colonoscopy per plan. Patient states he goes up to 2 weeks without any bleeding whatsoever.  The risks, benefits, limitations, imponderables and alternatives regarding both EGD and colonoscopy have been reviewed with the patient. Questions have been answered. All parties agreeable.      Notice: This dictation was prepared with Dragon dictation along with smaller phrase technology. Any transcriptional errors that result from this process are unintentional and may not be corrected upon review.

## 2017-12-19 NOTE — Op Note (Signed)
[facility] Patient Name: [name] Procedure Date: 12/19/2017 7:47 AM MRN: [id number] Date of Birth: September 09, 1955 Attending MD: Norvel Richards , MD CSN: [id number] Age: 62 Admit Type: Outpatient Procedure:                Colonoscopy Indications:              Hematochezia Providers:                Norvel Richards, MD, Jeanann Lewandowsky. Sharon Seller, RN,                            Nelma Rothman, Technician Referring MD:              Medicines:                Propofol per Anesthesia Complications:            No immediate complications. Estimated Blood Loss:     Estimated blood loss was minimal. Procedure:                Pre-Anesthesia Assessment:                           - Prior to the procedure, a History and Physical                            was performed, and patient medications and                            allergies were reviewed. The patient's tolerance of                            previous anesthesia was also reviewed. The risks                            and benefits of the procedure and the sedation                            options and risks were discussed with the patient.                            All questions were answered, and informed consent                            was obtained. Prior Anticoagulants: The patient has                            taken no previous anticoagulant or antiplatelet                            agents. ASA Grade Assessment: III - A patient with                            severe systemic disease. After reviewing the risks  and benefits, the patient was deemed in                            satisfactory condition to undergo the procedure.                           After obtaining informed consent, the colonoscope                            was passed under direct vision. Throughout the                            procedure, the patient's blood pressure, pulse, and                            oxygen saturations were  monitored continuously. The                            EC-[id number] ([id number]) scope was introduced through                            the and advanced to the the cecum, identified by                            appendiceal orifice and ileocecal valve. The                            colonoscopy was performed without difficulty. The                            patient tolerated the procedure well. The quality                            of the bowel preparation was adequate. The                            ileocecal valve, appendiceal orifice, and rectum                            were photographed. The entire colon was well                            visualized. Scope In: 7:54:39 AM Scope Out: 8:11:08 AM Scope Withdrawal Time: 0 hours 14 minutes 29 seconds  Total Procedure Duration: 0 hours 16 minutes 29 seconds  Findings:      The perianal and digital rectal examinations were normal.      A 5 mm polyp was found in the ascending colon. The polyp was sessile.       The polyp was removed with a cold snare. Resection and retrieval were       complete. Estimated blood loss: none. 1 cm serrated appearing polyp in       the ascending colon was removed with a hot snare. Resection and       retrieval were complete. Estimated blood loss was minimal.  Non-bleeding internal hemorrhoids were found during retroflexion. The       hemorrhoids were moderate, medium-sized and Grade II (internal       hemorrhoids that prolapse but reduce spontaneously). Impression:               - One 5 mm polyp in the ascending colon, removed                            with a cold snare and 1 cm sessile appearing polyp                            in the ascending removed with a hot snare.                           - Non-bleeding internal hemorrhoids. hemoglobin                            remains normal. I suspect anorectal origin of                            bleeding?"hemorrhoids. Moderate Sedation:      Moderate  (conscious) sedation was personally administered by an       anesthesia professional. The following parameters were monitored: oxygen       saturation, heart rate, blood pressure, respiratory rate, EKG, adequacy       of pulmonary ventilation, and response to care. Total physician       intraservice time was 34 minutes. Recommendation:           - Patient has a contact number available for                            emergencies. The signs and symptoms of potential                            delayed complications were discussed with the                            patient. Return to normal activities tomorrow.                            Written discharge instructions were provided to the                            patient.                           - Advance diet as tolerated today. follow-up on                            pathology. See EGD report. Pamphlet on hemorrhoids                            banding provided. Office visit with [place] in 2 months. Procedure Code(s):        --- Professional ---  45385, Colonoscopy, flexible; with removal of                            tumor(s), polyp(s), or other lesion(s) by snare                            technique Diagnosis Code(s):        --- Professional ---                           D12.2, Benign neoplasm of ascending colon                           K64.1, Second degree hemorrhoids                           K92.1, Melena (includes Hematochezia) CPT copyright 2017 American Medical Association. All rights reserved. The codes documented in this report are preliminary and upon coder review may  be revised to meet current compliance requirements. [name]. Senaida Chilcote, MD Norvel Richards, MD 12/19/2017 8:22:51 AM This report has been signed electronically. Number of Addenda: 0

## 2017-12-19 NOTE — Op Note (Signed)
[facility] Patient Name: [name] Procedure Date: 12/19/2017 7:20 AM MRN: [id number] Date of Birth: Jul 21, 1955 Attending MD: Norvel Richards , MD CSN: [id number] Age: 62 Admit Type: Outpatient Procedure:                Upper GI endoscopy Indications:              Dysphagia Providers:                Norvel Richards, MD, Jeanann Lewandowsky. Sharon Seller, RN,                            Nelma Rothman, Technician Referring MD:              Medicines:                Propofol per Anesthesia Complications:            No immediate complications. Estimated blood loss:                            Minimal. Estimated Blood Loss:      Procedure:                Pre-Anesthesia Assessment:                           - Prior to the procedure, a History and Physical                            was performed, and patient medications and                            allergies were reviewed. The patient's tolerance of                            previous anesthesia was also reviewed. The risks                            and benefits of the procedure and the sedation                            options and risks were discussed with the patient.                            All questions were answered, and informed consent                            was obtained. Prior Anticoagulants: The patient has                            taken no previous anticoagulant or antiplatelet                            agents. ASA Grade Assessment: III - A patient with                            severe systemic disease.  After reviewing the risks                            and benefits, the patient was deemed in                            satisfactory condition to undergo the procedure.                           After obtaining informed consent, the endoscope was                            passed under direct vision. Throughout the                            procedure, the patient's blood pressure, pulse, and     oxygen saturations were monitored continuously. The                            [id number] ([id number]) scope was introduced through the                            mouth, and advanced to the second part of duodenum.                            The EG-[id number] ([id number]) scope was introduced through                            the and advanced to the. The upper GI endoscopy was                            accomplished without difficulty. The patient                            tolerated the procedure well. Scope In: 7:41:09 AM Scope Out: 7:47:40 AM Total Procedure Duration: 0 hours 6 minutes 31 seconds  Findings:      Esophagitis was found. Couple of 5 mm distal esophageal erosions. Patent       tubular esophagus. No Barrett's epithelium seen.      Multiple dispersed erosions were found in the stomach. patchy erythema.       No ulcer or infiltrating proces, .      The duodenal bulb and second portion of the duodenum were normal. The       scope was withdrawn. Dilation was performed with a Maloney dilator with       mild resistance at 56 Fr. The dilation site was examined following       endoscope reinsertion and showed no change. Estimated blood loss: none.       Abnormal gastric mucosa was biopsied with a cold forceps for histology.       Estimated blood loss was minimal Impression:               - Mild erosiveux Esophagitis. Dilated.                           -  Erosive gastropathy. Biopsied.                           - Normal duodenal bulb and second portion of the                            duodenum. Moderate Sedation:      Moderate (conscious) sedation was personally administered by an       anesthesia professional. The following parameters were monitored: oxygen       saturation, heart rate, blood pressure, respiratory rate, EKG, adequacy       of pulmonary ventilation, and response to care. Total physician       intraservice time was 10 minutes. Recommendation:           - Patient has a  contact number available for                            emergencies. The signs and symptoms of potential                            delayed complications were discussed with the                            patient. Return to normal activities tomorrow.                            Written discharge instructions were provided to the                            patient.                           - Advance diet as tolerated. Protonix 40 mg twice                            daily. See colonoscopy report. Office visit with [place]                            in 2 months. Procedure Code(s):        --- Professional ---                           [id number], Esophagogastroduodenoscopy, flexible,                            transoral; with biopsy, single or multiple                           43450, Dilation of esophagus, by unguided sound or                            bougie, single or multiple passes Diagnosis Code(s):        --- Professional ---                           K20.9, Esophagitis, unspecified  K31.89, Other diseases of stomach and duodenum                           R13.10, Dysphagia, unspecified CPT copyright 2017 American Medical Association. All rights reserved. The codes documented in this report are preliminary and upon coder review may  be revised to meet current compliance requirements. [name]. Mionna Advincula, MD Norvel Richards, MD 12/19/2017 8:17:00 AM This report has been signed electronically. Number of Addenda: 0

## 2017-12-19 NOTE — Anesthesia Preprocedure Evaluation (Signed)
Anesthesia Evaluation  Patient identified by MRN, date of birth, ID band Patient awake    Reviewed: Allergy & Precautions, H&P , NPO status , Patient's Chart, lab work & pertinent test results, reviewed documented beta blocker date and time   Airway Mallampati: II  TM Distance: >3 FB Neck ROM: full    Dental no notable dental hx. (+) Poor Dentition, Dental Advidsory Given   Pulmonary neg pulmonary ROS, shortness of breath and with exertion, asthma , sleep apnea , COPD,  COPD inhaler, Current Smoker,    Pulmonary exam normal breath sounds clear to auscultation       Cardiovascular Exercise Tolerance: Good hypertension, + CAD and + Past MI  negative cardio ROS   Rhythm:regular Rate:Normal     Neuro/Psych negative neurological ROS  negative psych ROS   GI/Hepatic negative GI ROS, Neg liver ROS, hiatal hernia, GERD  ,  Endo/Other  negative endocrine ROS  Renal/GU negative Renal ROS  negative genitourinary   Musculoskeletal   Abdominal   Peds  Hematology negative hematology ROS (+)   Anesthesia Other Findings Rectal bleeding- active Multiple stents placed at multiple times- most recent 2 yrs ago.  Reproductive/Obstetrics negative OB ROS                             Anesthesia Physical Anesthesia Plan  ASA: IV  Anesthesia Plan: MAC   Post-op Pain Management:    Induction:   PONV Risk Score and Plan:   Airway Management Planned:   Additional Equipment:   Intra-op Plan:   Post-operative Plan:   Informed Consent: I have reviewed the patients History and Physical, chart, labs and discussed the procedure including the risks, benefits and alternatives for the proposed anesthesia with the patient or authorized representative who has indicated his/her understanding and acceptance.   Dental Advisory Given  Plan Discussed with: CRNA and Anesthesiologist  Anesthesia Plan Comments:          Anesthesia Quick Evaluation

## 2017-12-19 NOTE — Discharge Instructions (Signed)
°Colonoscopy °Discharge Instructions ° °Read the instructions outlined below and refer to this sheet in the next few weeks. These discharge instructions provide you with general information on caring for yourself after you leave the hospital. Your doctor may also give you specific instructions. While your treatment has been planned according to the most current medical practices available, unavoidable complications occasionally occur. If you have any problems or questions after discharge, call Dr. Rourk at [phone]. °ACTIVITY °· You may resume your regular activity, but move at a slower pace for the next 24 hours.  °· Take frequent rest periods for the next 24 hours.  °· Walking will help get rid of the air and reduce the bloated feeling in your belly (abdomen).  °· No driving for 24 hours (because of the medicine (anesthesia) used during the test).   °· Do not sign any important legal documents or operate any machinery for 24 hours (because of the anesthesia used during the test).  °NUTRITION °· Drink plenty of fluids.  °· You may resume your normal diet as instructed by your doctor.  °· Begin with a light meal and progress to your normal diet. Heavy or fried foods are harder to digest and may make you feel sick to your stomach (nauseated).  °· Avoid alcoholic beverages for 24 hours or as instructed.  °MEDICATIONS °· You may resume your normal medications unless your doctor tells you otherwise.  °WHAT YOU CAN EXPECT TODAY °· Some feelings of bloating in the abdomen.  °· Passage of more gas than usual.  °· Spotting of blood in your stool or on the toilet paper.  °IF YOU HAD POLYPS REMOVED DURING THE COLONOSCOPY: °· No aspirin products for 7 days or as instructed.  °· No alcohol for 7 days or as instructed.  °· Eat a soft diet for the next 24 hours.  °FINDING OUT THE RESULTS OF YOUR TEST °Not all test results are available during your visit. If your test results are not back during the visit, make an appointment  with your caregiver to find out the results. Do not assume everything is normal if you have not heard from your caregiver or the medical facility. It is important for you to follow up on all of your test results.  °SEEK IMMEDIATE MEDICAL ATTENTION IF: °· You have more than a spotting of blood in your stool.  °· Your belly is swollen (abdominal distention).  °· You are nauseated or vomiting.  °· You have a temperature over 101.  °· You have abdominal pain or discomfort that is severe or gets worse throughout the day.  °EGD °Discharge instructions °Please read the instructions outlined below and refer to this sheet in the next few weeks. These discharge instructions provide you with general information on caring for yourself after you leave the hospital. Your doctor may also give you specific instructions. While your treatment has been planned according to the most current medical practices available, unavoidable complications occasionally occur. If you have any problems or questions after discharge, please call your doctor. °ACTIVITY °· You may resume your regular activity but move at a slower pace for the next 24 hours.  °· Take frequent rest periods for the next 24 hours.  °· Walking will help expel (get rid of) the air and reduce the bloated feeling in your abdomen.  °· No driving for 24 hours (because of the anesthesia (medicine) used during the test).  °· You may shower.  °· Do not sign any important   legal documents or operate any machinery for 24 hours (because of the anesthesia used during the test).  NUTRITION  Drink plenty of fluids.   You may resume your normal diet.   Begin with a light meal and progress to your normal diet.   Avoid alcoholic beverages for 24 hours or as instructed by your caregiver.  MEDICATIONS  You may resume your normal medications unless your caregiver tells you otherwise.  WHAT YOU CAN EXPECT TODAY  You may experience abdominal discomfort such as a feeling of fullness  or gas pains.  FOLLOW-UP  Your doctor will discuss the results of your test with you.  SEEK IMMEDIATE MEDICAL ATTENTION IF ANY OF THE FOLLOWING OCCUR:  Excessive nausea (feeling sick to your stomach) and/or vomiting.   Severe abdominal pain and distention (swelling).   Trouble swallowing.   Temperature over 101 F (37.8 C).   Rectal bleeding or vomiting of blood.   GERD Information provided  Hemorrhoid information provided; pamphlet on hemorrhoids banding provided  Protonix 40 mg twice daily  Colon polyp information provided  Office visit with [place] in 2 months  Further recommendations to follow pending review of pathology report  PATIENT INSTRUCTIONS POST-ANESTHESIA  IMMEDIATELY FOLLOWING SURGERY:  Do not drive or operate machinery for the first twenty four hours after surgery.  Do not make any important decisions for twenty four hours after surgery or while taking narcotic pain medications or sedatives.  If you develop intractable nausea and vomiting or a severe headache please notify your doctor immediately.  FOLLOW-UP:  Please make an appointment with your surgeon as instructed. You do not need to follow up with anesthesia unless specifically instructed to do so.  WOUND CARE INSTRUCTIONS (if applicable):  Keep a dry clean dressing on the anesthesia/puncture wound site if there is drainage.  Once the wound has quit draining you may leave it open to air.  Generally you should leave the bandage intact for twenty four hours unless there is drainage.  If the epidural site drains for more than 36-48 hours please call the anesthesia department.  QUESTIONS?:  Please feel free to call your physician or the hospital operator if you have any questions, and they will be happy to assist you.       Hemorrhoids Hemorrhoids are swollen veins in and around the rectum or anus. Hemorrhoids can cause pain, itching, or bleeding. Most of the time, they do not cause serious problems. They  usually get better with diet changes, lifestyle changes, and other home treatments. Follow these instructions at home: Eating and drinking  Eat foods that have fiber, such as whole grains, beans, nuts, fruits, and vegetables. Ask your doctor about taking products that have added fiber (fibersupplements).  Drink enough fluid to keep your pee (urine) clear or pale yellow. For Pain and Swelling  Take a warm-water bath (sitz bath) for 20 minutes to ease pain. Do this 3-4 times a day.  If directed, put ice on the painful area. It may be helpful to use ice between your warm baths. ? Put ice in a plastic bag. ? Place a towel between your skin and the bag. ? Leave the ice on for 20 minutes, 2-3 times a day. General instructions  Take over-the-counter and prescription medicines only as told by your doctor. ? Medicated creams and medicines that are inserted into the anus (suppositories) may be used or applied as told.  Exercise often.  Go to the bathroom when you have the urge to poop (  to have a bowel movement). Do not wait.  Avoid pushing too hard (straining) when you poop.  Keep the butt area dry and clean. Use wet toilet paper or moist paper towels.  Do not sit on the toilet for a long time. Contact a doctor if:  You have any of these: ? Pain and swelling that do not get better with treatment or medicine. ? Bleeding that will not stop. ? Trouble pooping or you cannot poop. ? Pain or swelling outside the area of the hemorrhoids. This information is not intended to replace advice given to you by your health care provider. Make sure you discuss any questions you have with your health care provider.    Colon Polyps Polyps are tissue growths inside the body. Polyps can grow in many places, including the large intestine (colon). A polyp may be a round bump or a mushroom-shaped growth. You could have one polyp or several. Most colon polyps are noncancerous (benign). However, some colon  polyps can become cancerous over time. What are the causes? The exact cause of colon polyps is not known. What increases the risk? This condition is more likely to develop in people who:  Have a family history of colon cancer or colon polyps.  Are older than 72 or older than 45 if they are African American.  Have inflammatory bowel disease, such as ulcerative colitis or Crohn disease.  Are overweight.  Smoke cigarettes.  Do not get enough exercise.  Drink too much alcohol.  Eat a diet that is: ? High in fat and red meat. ? Low in fiber.  Had childhood cancer that was treated with abdominal radiation.  What are the signs or symptoms? Most polyps do not cause symptoms. If you have symptoms, they may include:  Blood coming from your rectum when having a bowel movement.  Blood in your stool.The stool may look dark red or black.  A change in bowel habits, such as constipation or diarrhea.  How is this diagnosed? This condition is diagnosed with a colonoscopy. This is a procedure that uses a lighted, flexible scope to look at the inside of your colon. How is this treated? Treatment for this condition involves removing any polyps that are found. Those polyps will then be tested for cancer. If cancer is found, your health care provider will talk to you about options for colon cancer treatment. Follow these instructions at home: Diet  Eat plenty of fiber, such as fruits, vegetables, and whole grains.  Eat foods that are high in calcium and vitamin D, such as milk, cheese, yogurt, eggs, liver, fish, and broccoli.  Limit foods high in fat, red meats, and processed meats, such as hot dogs, sausage, bacon, and lunch meats.  Maintain a healthy weight, or lose weight if recommended by your health care provider. General instructions  Do not smoke cigarettes.  Do not drink alcohol excessively.  Keep all follow-up visits as told by your health care provider. This is important.  This includes keeping regularly scheduled colonoscopies. Talk to your health care provider about when you need a colonoscopy.  Exercise every day or as told by your health care provider. Contact a health care provider if:  You have new or worsening bleeding during a bowel movement.  You have new or increased blood in your stool.  You have a change in bowel habits.  You unexpectedly lose weight. This information is not intended to replace advice given to you by your health care provider. Make sure  you discuss any questions you have with your health care provider.   Gastroesophageal Reflux Disease, Adult Normally, food travels down the esophagus and stays in the stomach to be digested. If a person has gastroesophageal reflux disease (GERD), food and stomach acid move back up into the esophagus. When this happens, the esophagus becomes sore and swollen (inflamed). Over time, GERD can make small holes (ulcers) in the lining of the esophagus. Follow these instructions at home: Diet  Follow a diet as told by your doctor. You may need to avoid foods and drinks such as: ? Coffee and tea (with or without caffeine). ? Drinks that contain alcohol. ? Energy drinks and sports drinks. ? Carbonated drinks or sodas. ? Chocolate and cocoa. ? Peppermint and mint flavorings. ? Garlic and onions. ? Horseradish. ? Spicy and acidic foods, such as peppers, chili powder, curry powder, vinegar, hot sauces, and BBQ sauce. ? Citrus fruit juices and citrus fruits, such as oranges, lemons, and limes. ? Tomato-based foods, such as red sauce, chili, salsa, and pizza with red sauce. ? Fried and fatty foods, such as donuts, french fries, potato chips, and high-fat dressings. ? High-fat meats, such as hot dogs, rib eye steak, sausage, ham, and bacon. ? High-fat dairy items, such as whole milk, butter, and cream cheese.  Eat small meals often. Avoid eating large meals.  Avoid drinking large amounts of liquid with  your meals.  Avoid eating meals during the 2-3 hours before bedtime.  Avoid lying down right after you eat.  Do not exercise right after you eat. General instructions  Pay attention to any changes in your symptoms.  Take over-the-counter and prescription medicines only as told by your doctor. Do not take aspirin, ibuprofen, or other NSAIDs unless your doctor says it is okay.  Do not use any tobacco products, including cigarettes, chewing tobacco, and e-cigarettes. If you need help quitting, ask your doctor.  Wear loose clothes. Do not wear anything tight around your waist.  Raise (elevate) the head of your bed about 6 inches (15 cm).  Try to lower your stress. If you need help doing this, ask your doctor.  If you are overweight, lose an amount of weight that is healthy for you. Ask your doctor about a safe weight loss goal.  Keep all follow-up visits as told by your doctor. This is important. Contact a doctor if:  You have new symptoms.  You lose weight and you do not know why it is happening.  You have trouble swallowing, or it hurts to swallow.  You have wheezing or a cough that keeps happening.  Your symptoms do not get better with treatment.  You have a hoarse voice. Get help right away if:  You have pain in your arms, neck, jaw, teeth, or back.  You feel sweaty, dizzy, or light-headed.  You have chest pain or shortness of breath.  You throw up (vomit) and your throw up looks like blood or coffee grounds.  You pass out (faint).  Your poop (stool) is bloody or black.  You cannot swallow, drink, or eat. This information is not intended to replace advice given to you by your health care provider. Make sure you discuss any questions you have with your health care provider. Document Released: 11/17/2007 Document Revised: 11/06/2015 Document Reviewed: 09/25/2014 Elsevier Interactive Patient Education  [employer].

## 2017-12-19 NOTE — Transfer of Care (Signed)
Immediate Anesthesia Transfer of Care Note  Patient: [name] GANAWAY  Procedure(s) Performed: COLONOSCOPY WITH PROPOFOL (N/A ) ESOPHAGOGASTRODUODENOSCOPY (EGD) WITH PROPOFOL (N/A ) MALONEY DILATION (N/A ) BIOPSY POLYPECTOMY  Patient Location: PACU  Anesthesia Type:MAC  Level of Consciousness: awake, alert , oriented and patient cooperative  Airway & Oxygen Therapy: Patient Spontanous Breathing  Post-op Assessment: Report given to RN and Post -op Vital signs reviewed and stable  Post vital signs: Reviewed and stable  Last Vitals:  Vitals Value Taken Time  BP 91/63 12/19/2017  8:17 AM  Temp 97.9   Pulse 71 12/19/2017  8:18 AM  Resp 12 12/19/2017  8:18 AM  SpO2 97 % 12/19/2017  8:18 AM  Vitals shown include unvalidated device data.  Last Pain:  Vitals:   12/19/17 0738  TempSrc:   PainSc: 0-No pain      Patients Stated Pain Goal: 6 (35/00/93 8182)  Complications: No apparent anesthesia complications

## 2017-12-23 ENCOUNTER — Encounter: Payer: Self-pay | Admitting: Internal Medicine

## 2017-12-23 ENCOUNTER — Encounter (HOSPITAL_COMMUNITY): Payer: Self-pay | Admitting: Internal Medicine

## 2017-12-26 ENCOUNTER — Ambulatory Visit (INDEPENDENT_AMBULATORY_CARE_PROVIDER_SITE_OTHER): Payer: Medicare Other | Admitting: Psychiatry

## 2017-12-26 ENCOUNTER — Encounter (HOSPITAL_COMMUNITY): Payer: Self-pay | Admitting: Psychiatry

## 2017-12-26 DIAGNOSIS — F17200 Nicotine dependence, unspecified, uncomplicated: Secondary | ICD-10-CM

## 2017-12-26 DIAGNOSIS — F5105 Insomnia due to other mental disorder: Secondary | ICD-10-CM

## 2017-12-26 DIAGNOSIS — F418 Other specified anxiety disorders: Secondary | ICD-10-CM

## 2017-12-26 MED ORDER — GABAPENTIN 300 MG PO CAPS: ORAL_CAPSULE | ORAL | 1 refills | Status: DC

## 2017-12-26 MED ORDER — TEMAZEPAM 30 MG PO CAPS: 30.0000 mg | ORAL_CAPSULE | Freq: Every evening | ORAL | 2 refills | Status: DC | PRN

## 2017-12-26 MED ORDER — FLUOXETINE HCL 20 MG PO CAPS: 20.0000 mg | ORAL_CAPSULE | Freq: Every day | ORAL | 2 refills | Status: DC

## 2017-12-26 MED ORDER — FLUOXETINE HCL 40 MG PO CAPS: 40.0000 mg | ORAL_CAPSULE | Freq: Every day | ORAL | 2 refills | Status: DC

## 2017-12-26 MED ORDER — ALPRAZOLAM 1 MG PO TABS: 1.0000 mg | ORAL_TABLET | Freq: Four times a day (QID) | ORAL | 2 refills | Status: DC

## 2017-12-26 MED ORDER — OLANZAPINE 10 MG PO TABS: 10.0000 mg | ORAL_TABLET | Freq: Every day | ORAL | 2 refills | Status: DC

## 2017-12-26 NOTE — Progress Notes (Signed)
BH MD/PA/NP OP Progress Note  12/26/2017 9:43 AM [name] LASECKI  MRN:  [id number]  Chief Complaint:  Chief Complaint    Depression; Anxiety; Follow-up     HPI: This patient is a 62 year old married male who lives with his wife in [place].  He is on disability for coronary artery disease and COPD.  He used to work for the [employer].  The patient returns for follow-up of his depression and anxiety.  He returns after 3 months.  He states that he is doing somewhat better.  His adopted daughter had killed herself last Christmas and this is been difficult for the whole family.  He and his wife now have custody of her 68-year-old son and spend a lot of time with her 43-year-old daughter as well.  So far he is doing pretty well with this.  He states the medications are helping his anxiety and depression.  He is trying to stay busy.  He is sleeping fairly well most of the time Visit Diagnosis:    ICD-10-CM   1. Depression with anxiety F41.8 gabapentin (NEURONTIN) 300 MG capsule    DISCONTINUED: gabapentin (NEURONTIN) 300 MG capsule    DISCONTINUED: gabapentin (NEURONTIN) 300 MG capsule  2. 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 TOBACCO ABUSE F17.200 gabapentin (NEURONTIN) 300 MG capsule    DISCONTINUED: gabapentin (NEURONTIN) 300 MG capsule    DISCONTINUED: gabapentin (NEURONTIN) 300 MG capsule    Past Psychiatric History: none  Past Medical History:  Past Medical History:  Diagnosis Date  . Anxiety   . Anxiety and depression   . ASCVD (arteriosclerotic cardiovascular disease)    inferior MI with circumflex PTCA in 1993;cath in 2000-50% OM 1&2,30% LAD ;2008-25% LAD, 80% nondominant right ,60% small OM 2,EF of 55% with severe basilar inferior hypokinesis; stress nuclear study in 04/2008 moderate inferior scar with peri-infarction  ischemic  . Asthma   . CAD (coronary artery disease)   . Carcinoma in situ of colon 2004   rectal polyp  . Colitis, ischemic ([place]) 2011  . COPD (chronic obstructive pulmonary disease) (HCC)    mild ;excercise induced hypoxemia by cp stress test ;asthma ,bronchitis,  . Depression   . Diverticulosis   . Gastritis 12/30/10   EGD Dr [name]  . GERD (gastroesophageal reflux disease)   . H. pylori infection 2004   treated  . Hemorrhoids   . Hiatal hernia   . Hyperlipidemia   . Hypertension   . Inflammatory polyps of colon with rectal bleeding ([place])   . Leukocytosis    [name]  . Lung nodule 07/16/2011  . Myocardial infarction [facility])    age 82  . Restless leg syndrome   . Schatzki's ring   . Sleep apnea    does not use CPAP:cannot tolerate, PCP aware  . Syncope   . Tobacco abuse    50 pack years continuing at one halp pack daily  . Tubular adenoma of colon 06/2009   Colonosocpy Dr [name]    Past Surgical History:  Procedure Laterality Date  . BIOPSY  12/19/2017   Procedure: BIOPSY;  Surgeon: [name], MD;  [facility];  Service: Endoscopy;;  gastric  . CARDIAC CATHETERIZATION    . CHOLECYSTECTOMY  2004  . COLONOSCOPY  06/2009   normal terminal ileum, segmental mild inflammation of sigmoid colon (bx unremarkable),  polyp, tubular adenoma  . COLONOSCOPY N/A 07/31/2013   Dr.Rourk- redundant anal canal hemorrhoids, colonic diverticulosis, tubular adenoma  . COLONOSCOPY W/ POLYPECTOMY  2004   rectal polyp with carcinoma in situ removed via colonoscopy  . COLONOSCOPY WITH PROPOFOL N/A 09/15/2015   Dr. [name]: Scattered diverticula throughout the colon, 2 sessile polyps found in the descending colon and cecum, 5 mm in size.  Cecal polyp was sessile serrated polyp, descending colon polyp was a tubular adenoma.  He had a abnormal perianal exam along with grade 3 hemorrhoids.  Surveillance exam recommended for 5-year follow-up.  . COLONOSCOPY WITH PROPOFOL N/A 12/19/2017    Procedure: COLONOSCOPY WITH PROPOFOL;  Surgeon: [name], MD;  [facility];  Service: Endoscopy;  Laterality: N/A;  7:30am  . CORONARY ANGIOPLASTY WITH STENT PLACEMENT  01/26/2012   "1; total is now 2"  . ESOPHAGOGASTRODUODENOSCOPY  02/2009   query Barrett's but bx negative  . ESOPHAGOGASTRODUODENOSCOPY  12/30/2010   [name],gastritis, dilated 47F, sm HH, 1 small ulcer, Duodenal erosions, benign bx  . ESOPHAGOGASTRODUODENOSCOPY (EGD) WITH ESOPHAGEAL DILATION N/A 09/12/2012   DJT:[id number] Schatzki's ring s/p Maloney dilator. Small hiatal hernia. negative path  . ESOPHAGOGASTRODUODENOSCOPY (EGD) WITH ESOPHAGEAL DILATION N/A 07/31/2013   Dr. [name]- normal egd, s[facility]p [facility] dilation empirically. Normal small bowel biopsies   . ESOPHAGOGASTRODUODENOSCOPY (EGD) WITH PROPOFOL N/A 09/15/2015   Dr. [name]: Medium sized hiatal hernia, normal-appearing esophagus status post empiric dilation  . ESOPHAGOGASTRODUODENOSCOPY (EGD) WITH PROPOFOL N/A 12/19/2017   Procedure: ESOPHAGOGASTRODUODENOSCOPY (EGD) WITH PROPOFOL;  Surgeon: [name], MD;  [facility];  Service: Endoscopy;  Laterality: N/A;  . FLEXIBLE SIGMOIDOSCOPY  12/30/2010    [name],; internal hemorrhoids, anal papilla  . HAND SURGERY     surgical intervention for injury of the fingers of the left hand many years ago  . heart stent    . HEMORRHOID BANDING     Dr. [name]  . LEFT HEART CATHETERIZATION WITH CORONARY ANGIOGRAM N/A 01/26/2012   Procedure: LEFT HEART CATHETERIZATION WITH CORONARY ANGIOGRAM;  Surgeon: [name], MD;  Location: [facility] CATH LAB;  Service: Cardiovascular;  Laterality: N/A;  . [name] DILATION N/A 09/15/2015   Procedure: [name] DILATION;  Surgeon: [name], MD;  [facility];  Service: Endoscopy;  Laterality: N/A;  . [name] DILATION N/A 12/19/2017   Procedure: [name] DILATION;  Surgeon: [name], MD;  [facility];  Service: Endoscopy;  Laterality:  N/A;  . NASAL SEPTOPLASTY W/ TURBINOPLASTY  10/06/2011   Procedure: NASAL SEPTOPLASTY WITH TURBINATE REDUCTION;  Surgeon: Izora Gala, MD;  Location: [place];  Service: ENT;  Laterality: Bilateral;  . PERCUTANEOUS CORONARY STENT INTERVENTION (PCI-S) N/A 01/26/2012   Procedure: PERCUTANEOUS CORONARY STENT INTERVENTION (PCI-S);  Surgeon: [name], MD;  Location: [facility] CATH LAB;  Service: Cardiovascular;  Laterality: N/A;  . POLYPECTOMY  09/15/2015   Procedure: POLYPECTOMY;  Surgeon: [name], MD;  [facility];  Service: Endoscopy;;  Cecal polyp removed via cold snare/ Descending colon polyp removed via cold snare  . POLYPECTOMY  12/19/2017   Procedure: POLYPECTOMY;  Surgeon: [name], MD;  [facility];  Service: Endoscopy;;  colon    Family Psychiatric History: See below  Family History:  Family History  Problem Relation Age of Onset  . Lung disease Father        deceased, black lung  . Heart disease Mother  blood clots  . Depression Mother   . Cancer Paternal Uncle        unknown type  . Cancer Maternal Aunt        unknown type  . Kidney failure Maternal Uncle   . Hypertension Brother   . Colon cancer Neg Hx   . ADD / ADHD Neg Hx   . Alcohol abuse Neg Hx   . Drug abuse Neg Hx   . Anxiety disorder Neg Hx   . Bipolar disorder Neg Hx   . Dementia Neg Hx   . OCD Neg Hx   . Paranoid behavior Neg Hx   . Schizophrenia Neg Hx   . Physical abuse Neg Hx   . Sexual abuse Neg Hx   . Seizures Neg Hx     Social History:  Social History   Socioeconomic History  . Marital status: Married    Spouse name: Not on file  . Number of children: 3  . Years of education: Not on file  . Highest education level: Not on file  Occupational History  . Occupation: disable    Employer: RETIRED    Comment: [employer]  Social Needs  . Financial resource strain: Not on file  . Food insecurity:    Worry: Not on file    Inability: Not on file  . Transportation  needs:    Medical: Not on file    Non-medical: Not on file  Tobacco Use  . Smoking status: Current Some Day Smoker    Packs/day: 0.50    Years: 30.00    Pack years: 15.00    Types: Cigarettes    Start date: 07/31/1969  . Smokeless tobacco: Former Systems developer  . Tobacco comment: cutting back trying to quit  Substance and Sexual Activity  . Alcohol use: Yes    Comment: Drinks a beer occasionally  . Drug use: No  . Sexual activity: Not Currently  Lifestyle  . Physical activity:    Days per week: Not on file    Minutes per session: Not on file  . Stress: Not on file  Relationships  . Social connections:    Talks on phone: Not on file    Gets together: Not on file    Attends religious service: Not on file    Active member of club or organization: Not on file    Attends meetings of clubs or organizations: Not on file    Relationship status: Not on file  Other Topics Concern  . Not on file  Social History Narrative   3 stepchildren    Allergies: No Known Allergies  Metabolic Disorder Labs: Lab Results  Component Value Date   HGBA1C 5.3 07/24/2015   MPG 103 10/29/2013   MPG 97 04/30/2013   No results found for: PROLACTIN Lab Results  Component Value Date   CHOL 138 12/13/2017   TRIG 304 (H) 12/13/2017   HDL 34 (L) 12/13/2017   CHOLHDL 4.1 12/13/2017   VLDL 64 (H) 06/20/2014   LDLCALC 43 12/13/2017   LDLCALC 68 10/01/2016   Lab Results  Component Value Date   TSH 1.011 02/09/2010    Therapeutic Level Labs: No results found for: LITHIUM No results found for: VALPROATE No components found for:  CBMZ  Current Medications: Current Outpatient Medications  Medication Sig Dispense Refill  . albuterol (PROVENTIL) (2.5 MG/3ML) 0.083% nebulizer solution Take 2.5 mg by nebulization every 6 (six) hours as needed for wheezing or shortness of breath.     Marland Kitchen  ALPRAZolam (XANAX) 1 MG tablet Take 1 tablet (1 mg total) by mouth 4 (four) times daily. 120 tablet 2  . aspirin EC 81 MG  tablet Take 81 mg by mouth daily.    Marland Kitchen atorvastatin (LIPITOR) 80 MG tablet Take 1 tablet (80 mg total) by mouth daily. 90 tablet 3  . budesonide-formoterol (SYMBICORT) 80-4.5 MCG/ACT inhaler Inhale 2 puffs into the lungs 2 (two) times daily. 1 Inhaler 12  . FLUoxetine (PROZAC) 20 MG capsule Take 1 capsule (20 mg total) by mouth daily. 90 capsule 2  . FLUoxetine (PROZAC) 40 MG capsule Take 1 capsule (40 mg total) by mouth daily. 90 capsule 2  . fluticasone (FLONASE) 50 MCG/ACT nasal spray Place 2 sprays into both nostrils daily. (Patient taking differently: Place 2 sprays into both nostrils daily as needed for allergies. ) 16 g 5  . gabapentin (NEURONTIN) 300 MG capsule TAKE 2 CAPSULES BY MOUTH 3 TIMES DAILY. 540 capsule 1  . HYDROcodone-acetaminophen (NORCO) 7.5-325 MG tablet Take 1 tablet by mouth 2 (two) times daily as needed for moderate pain. 60 tablet 0  . lisinopril (PRINIVIL,ZESTRIL) 2.5 MG tablet TAKE 1 TABLET (2.5 MG TOTAL) BY MOUTH DAILY. **STOP VERAPAMIL** 30 tablet 12  . metoprolol succinate (TOPROL-XL) 50 MG 24 hr tablet Take 1 tablet (50 mg total) by mouth daily. 90 tablet 1  . nitroGLYCERIN (NITROSTAT) 0.4 MG SL tablet Place 0.4 mg under the tongue every 5 (five) minutes as needed for chest pain.    Marland Kitchen OLANZapine (ZYPREXA) 10 MG tablet Take 1 tablet (10 mg total) by mouth at bedtime. 90 tablet 2  . rOPINIRole (REQUIP) 2 MG tablet TAKE 1 TABLET (2 MG TOTAL) BY MOUTH AT BEDTIME. 90 tablet 2  . temazepam (RESTORIL) 30 MG capsule Take 1 capsule (30 mg total) by mouth at bedtime as needed for sleep. 30 capsule 2   No current facility-administered medications for this visit.      Musculoskeletal: Strength & Muscle Tone: within normal limits Gait & Station: normal Patient leans: N/A  Psychiatric Specialty Exam: Review of Systems  All other systems reviewed and are negative.   Blood pressure 125/73, pulse 66, height 5\' 10"  (1.778 m), weight 166 lb 3.2 oz (75.4 kg), SpO2 98 %.Body  mass index is 23.85 kg/m.  General Appearance: Casual and Fairly Groomed  Eye Contact:  Fair  Speech:  Clear and Coherent  Volume:  Decreased  Mood:  Euthymic  Affect:  Constricted  Thought Process:  Goal Directed  Orientation:  Full (Time, Place, and Person)  Thought Content: Rumination   Suicidal Thoughts:  No  Homicidal Thoughts:  No  Memory:  Immediate;   Good Recent;   Good Remote;   Fair  Judgement:  Fair  Insight:  Fair  Psychomotor Activity:  Decreased  Concentration:  Concentration: Fair and Attention Span: Fair  Recall:  Good  Fund of Knowledge: Fair  Language: Good  Akathisia:  No  Handed:  Right  AIMS (if indicated): not done  Assets:  Communication Skills Desire for Improvement Resilience Social Support Talents/Skills  ADL's:  Intact  Cognition: WNL  Sleep:  Fair   Screenings: PHQ2-9     Office Visit from 05/19/2017 in [place] Office Visit from 10/21/2015 in [place] Office Visit from 04/23/2015 in [facility]  PHQ-2 Total Score  0  0  0       Assessment and Plan: This patient is a 62 year old male with a history of depression and  anxiety.  For the most part he is doing well considering the circumstances of his daughter's recent death.  He will continue Xanax 1 mg 4 times daily for anxiety, Prozac 60 mg daily for depression, gabapentin X 100 mg 3 times daily for anxiety and olanzapine 10 mg at bedtime for mood stabilization and sleep as well as Restoril 30 mg at bedtime for sleep.  He will return to see me in 3 months   [name], MD 12/26/2017, 9:43 AM

## 2018-01-07 ENCOUNTER — Other Ambulatory Visit: Payer: Self-pay | Admitting: Family Medicine

## 2018-02-15 ENCOUNTER — Ambulatory Visit: Payer: Medicare Other | Admitting: Family Medicine

## 2018-02-24 ENCOUNTER — Other Ambulatory Visit (HOSPITAL_COMMUNITY): Payer: Self-pay | Admitting: Psychiatry

## 2018-02-24 DIAGNOSIS — F418 Other specified anxiety disorders: Secondary | ICD-10-CM

## 2018-02-24 DIAGNOSIS — F17200 Nicotine dependence, unspecified, uncomplicated: Secondary | ICD-10-CM

## 2018-02-24 DIAGNOSIS — F5105 Insomnia due to other mental disorder: Secondary | ICD-10-CM

## 2018-02-24 MED ORDER — GABAPENTIN 300 MG PO CAPS: ORAL_CAPSULE | ORAL | 1 refills | Status: DC

## 2018-02-27 ENCOUNTER — Other Ambulatory Visit (HOSPITAL_COMMUNITY): Payer: Self-pay | Admitting: Psychiatry

## 2018-02-27 DIAGNOSIS — F17200 Nicotine dependence, unspecified, uncomplicated: Secondary | ICD-10-CM

## 2018-02-27 DIAGNOSIS — F5105 Insomnia due to other mental disorder: Secondary | ICD-10-CM

## 2018-02-27 DIAGNOSIS — F418 Other specified anxiety disorders: Secondary | ICD-10-CM

## 2018-02-27 MED ORDER — GABAPENTIN 300 MG PO CAPS: ORAL_CAPSULE | ORAL | 2 refills | Status: DC

## 2018-02-27 MED ORDER — GABAPENTIN 300 MG PO CAPS: 600.0000 mg | ORAL_CAPSULE | Freq: Three times a day (TID) | ORAL | 2 refills | Status: DC

## 2018-02-27 MED ORDER — GABAPENTIN 300 MG PO CAPS: ORAL_CAPSULE | ORAL | 1 refills | Status: DC

## 2018-02-28 ENCOUNTER — Telehealth: Payer: Self-pay | Admitting: Family Medicine

## 2018-02-28 NOTE — Telephone Encounter (Signed)
Please advise 

## 2018-02-28 NOTE — Telephone Encounter (Signed)
Patient made an appointment with Dr. [name] but our next available wasn't until October 17th.  Patient will need a refill on his HYDROcodone-acetaminophen (NORCO) 7.5-325 MG tablet  By September 22nd. Can pick up when ready.

## 2018-03-01 MED ORDER — HYDROCODONE-ACETAMINOPHEN 7.5-325 MG PO TABS: 1.0000 | ORAL_TABLET | Freq: Two times a day (BID) | ORAL | 0 refills | Status: DC | PRN

## 2018-03-01 NOTE — Telephone Encounter (Signed)
The patient may have a printed prescription that can get filled on March 05, 2018 Go ahead and print prescription I will sign it Patient may pick

## 2018-03-02 MED ORDER — HYDROCODONE-ACETAMINOPHEN 7.5-325 MG PO TABS: 1.0000 | ORAL_TABLET | Freq: Two times a day (BID) | ORAL | 0 refills | Status: DC | PRN

## 2018-03-02 NOTE — Telephone Encounter (Signed)
Prescription up front for pick up. Left message to return call to notify patient.

## 2018-03-06 ENCOUNTER — Other Ambulatory Visit: Payer: Self-pay

## 2018-03-06 NOTE — Telephone Encounter (Signed)
Patient is aware to come by the office and pick up from the front desk.

## 2018-03-07 ENCOUNTER — Telehealth: Payer: Self-pay | Admitting: Internal Medicine

## 2018-03-07 ENCOUNTER — Telehealth: Payer: Self-pay | Admitting: *Deleted

## 2018-03-07 ENCOUNTER — Encounter: Payer: Self-pay | Admitting: Internal Medicine

## 2018-03-07 ENCOUNTER — Ambulatory Visit: Payer: Medicare Other | Admitting: Internal Medicine

## 2018-03-07 NOTE — Telephone Encounter (Signed)
Duplicate message. Pt picked up yesterday

## 2018-03-07 NOTE — Telephone Encounter (Signed)
Patient was a no show and letter sent  °

## 2018-03-07 NOTE — Telephone Encounter (Signed)
Fax from [facility] [address] requesting a refill on hydrocodone 7.5-325mg  last seen 11/15/17. Has appt coming up on 03/30/18.

## 2018-03-28 ENCOUNTER — Ambulatory Visit (HOSPITAL_COMMUNITY): Payer: Self-pay | Admitting: Psychiatry

## 2018-03-30 ENCOUNTER — Ambulatory Visit: Payer: Medicare Other | Admitting: Family Medicine

## 2018-03-30 ENCOUNTER — Encounter: Payer: Self-pay | Admitting: Family Medicine

## 2018-03-30 VITALS — BP 126/82 | Ht 71.0 in | Wt 169.0 lb

## 2018-03-30 DIAGNOSIS — Z23 Encounter for immunization: Secondary | ICD-10-CM

## 2018-03-30 DIAGNOSIS — R05 Cough: Secondary | ICD-10-CM | POA: Diagnosis not present

## 2018-03-30 DIAGNOSIS — I10 Essential (primary) hypertension: Secondary | ICD-10-CM | POA: Diagnosis not present

## 2018-03-30 DIAGNOSIS — R059 Cough, unspecified: Secondary | ICD-10-CM

## 2018-03-30 DIAGNOSIS — J449 Chronic obstructive pulmonary disease, unspecified: Secondary | ICD-10-CM

## 2018-03-30 DIAGNOSIS — E785 Hyperlipidemia, unspecified: Secondary | ICD-10-CM

## 2018-03-30 DIAGNOSIS — Z79891 Long term (current) use of opiate analgesic: Secondary | ICD-10-CM

## 2018-03-30 MED ORDER — HYDROCODONE-ACETAMINOPHEN 7.5-325 MG PO TABS: ORAL_TABLET | ORAL | 0 refills | Status: DC

## 2018-03-30 MED ORDER — HYDROCODONE-ACETAMINOPHEN 7.5-325 MG PO TABS: 1.0000 | ORAL_TABLET | Freq: Two times a day (BID) | ORAL | 0 refills | Status: DC | PRN

## 2018-03-30 NOTE — Progress Notes (Signed)
Subjective:    Patient ID: [name], male    DOB: [birth year], 62 y.o.   MRN: [id number]  HPI This patient was seen today for chronic pain  The medication list was reviewed and updated.   -Compliance with medication: Yes  - Number patient states they take daily: 1 bid  -when was the last dose patient took? This am after 7 am  The patient was advised the importance of maintaining medication and not using illegal substances with these.  Here for refills and follow up  The patient was educated that we can provide 3 monthly scripts for their medication, it is their responsibility to follow the instructions. Patient states the pain medicine does help him cope Side effects or complications from medications: None  Patient is aware that pain medications are meant to minimize the severity of the pain to allow their pain levels to improve to allow for better function. They are aware of that pain medications cannot totally remove their pain.  Due for UDT ( at least once per year) : 03/30/2018 today  25 minutes was spent with the patient.  This statement verifies that 25 minutes was indeed spent with the patient.  More than 50% of this visit-total duration of the visit-was spent in counseling and coordination of care. The issues that the patient came in for today as reflected in the diagnosis (s) please refer to documentation for further details.  Patient is also here today to discuss chronic health issues.   Patient for blood pressure check up.  The patient does have hypertension.  The patient is on medication.  Patient relates compliance with meds. Todays BP reviewed with the patient. Patient denies issues with medication. Patient relates reasonable diet. Patient tries to minimize salt. Patient aware of BP goals.  Patient here for follow-up regarding cholesterol.  The patient does have hyperlipidemia.  Patient does try to maintain a reasonable diet.  Patient does take the medication on  a regular basis.  Denies missing a dose.  The patient denies any obvious side effects.  Prior blood work results reviewed with the patient.  The patient is aware of his cholesterol goals and the need to keep it under good control to lessen the risk of disease.  Patient patient has COPD and still smokes he knows he needs to quit he has been having some moderate coughing lately.  Because of this at times he does get short of breath he is disabled  Patient does also have heart disease and takes his medication on a regular basis.  Denies any chest tightness pressure pain or shortness of breath     Review of Systems  Constitutional: Negative for activity change, appetite change and fever.  HENT: Negative for congestion and rhinorrhea.   Eyes: Negative for discharge.  Respiratory: Negative for cough and wheezing.   Cardiovascular: Negative for chest pain.  Gastrointestinal: Negative for abdominal pain, blood in stool and vomiting.  Genitourinary: Negative for difficulty urinating and frequency.  Musculoskeletal: Negative for neck pain.  Skin: Negative for rash.  Allergic/Immunologic: Negative for environmental allergies and food allergies.  Neurological: Negative for weakness and headaches.  Psychiatric/Behavioral: Negative for agitation.       Objective:   Physical Exam  Constitutional: He appears well-nourished. No distress.  HENT:  Head: Normocephalic and atraumatic.  Eyes: Right eye exhibits no discharge. Left eye exhibits no discharge.  Neck: No tracheal deviation present.  Cardiovascular: Normal rate, regular rhythm and normal heart sounds.  No  murmur heard. Pulmonary/Chest: Effort normal and breath sounds normal. No respiratory distress.  Musculoskeletal: He exhibits no edema.  Lymphadenopathy:    He has no cervical adenopathy.  Neurological: He is alert. Coordination normal.  Skin: Skin is warm and dry.  Psychiatric: He has a normal mood and affect. His behavior is normal.    Vitals reviewed.         Assessment & Plan:  The patient was seen in followup for chronic pain. A review over at their current pain status was discussed. Drug registry was checked. Prescriptions were given. Discussion was held regarding the importance of compliance with medication as well as pain medication contract.  Time for questions regarding pain management plan occurred. Importance of regular followup visits was discussed. Patient was informed that medication may cause drowsiness and should not be combined  with other medications/alcohol or street drugs. Patient was cautioned that medication could cause drowsiness. If the patient feels medication is causing altered alertness then do not drive or operate dangerous equipment.  3 prescriptions were sent in electronically  COPD with coughing still smokes he has been counseled to quit smoking unlikely he will be able to do so because of his psychiatric illness I believe the patient would benefit from having a chest x-ray may also benefit from having a CT scan of the chest in addition to this I recommend a consultation with pulmonology but we will go ahead and work on set him up with Dr. Luan Pulling in addition to this patient to continue using his COPD medications  Severe stress anxiety and depression followed by psychiatry this is made worse by the fact that he stays at the same home that his daughter committed suicide he is looking at moving to a different location  The patient was seen today as part of an evaluation regarding hyperlipidemia.  Recent lab work has been reviewed with the patient as well as the goals for good cholesterol care.  In addition to this medications have been discussed the importance of compliance with diet and medications discussed as well.  Finally the patient is aware that poor control of cholesterol, noncompliance can dramatically increase the risk of complications. The patient will keep regular office visits  and the patient does agreed to periodic lab work.  HTN- Patient was seen today as part of a visit regarding hypertension. The importance of healthy diet and regular physical activity was discussed. The importance of compliance with medications discussed.  Ideal goal is to keep blood pressure low elevated levels certainly below 121/97 when possible.  The patient was counseled that keeping blood pressure under control lessen his risk of complications.  The importance of regular follow-ups was discussed with the patient.  Low-salt diet such as DASH recommended.  Regular physical activity was recommended as well.  Patient was advised to keep regular follow-ups.   Patient is disabled unlikely to ever be able to work again

## 2018-04-03 ENCOUNTER — Ambulatory Visit (HOSPITAL_COMMUNITY)
Admission: RE | Admit: 2018-04-03 | Discharge: 2018-04-03 | Disposition: A | Discharge status: Home / Self Care | Payer: Medicare Other | Source: Ambulatory Visit | Attending: Family Medicine | Admitting: Family Medicine

## 2018-04-03 DIAGNOSIS — J449 Chronic obstructive pulmonary disease, unspecified: Secondary | ICD-10-CM | POA: Diagnosis not present

## 2018-04-03 DIAGNOSIS — R059 Cough, unspecified: Secondary | ICD-10-CM

## 2018-04-03 DIAGNOSIS — R05 Cough: Secondary | ICD-10-CM | POA: Insufficient documentation

## 2018-04-05 LAB — TOXASSURE SELECT 13 (MW), URINE

## 2018-04-06 ENCOUNTER — Encounter: Payer: Self-pay | Admitting: Family Medicine

## 2018-04-07 ENCOUNTER — Other Ambulatory Visit: Payer: Self-pay | Admitting: *Deleted

## 2018-04-07 MED ORDER — DOXYCYCLINE HYCLATE 100 MG PO TABS: 100.0000 mg | ORAL_TABLET | Freq: Two times a day (BID) | ORAL | 0 refills | Status: DC

## 2018-04-29 ENCOUNTER — Other Ambulatory Visit: Payer: Self-pay | Admitting: Family Medicine

## 2018-05-01 NOTE — Telephone Encounter (Signed)
May refill 90 days, 3 refills

## 2018-05-08 ENCOUNTER — Other Ambulatory Visit (HOSPITAL_COMMUNITY): Payer: Self-pay | Admitting: Psychiatry

## 2018-06-05 ENCOUNTER — Other Ambulatory Visit: Payer: Self-pay | Admitting: Cardiology

## 2018-06-11 ENCOUNTER — Other Ambulatory Visit (HOSPITAL_COMMUNITY): Payer: Self-pay | Admitting: Psychiatry

## 2018-06-30 ENCOUNTER — Telehealth: Payer: Self-pay | Admitting: Family Medicine

## 2018-06-30 ENCOUNTER — Encounter: Payer: Self-pay | Admitting: Family Medicine

## 2018-06-30 ENCOUNTER — Ambulatory Visit: Payer: Medicare Other | Admitting: Family Medicine

## 2018-06-30 VITALS — BP 124/80 | Ht 71.0 in | Wt 173.6 lb

## 2018-06-30 DIAGNOSIS — R05 Cough: Secondary | ICD-10-CM

## 2018-06-30 DIAGNOSIS — R053 Chronic cough: Secondary | ICD-10-CM

## 2018-06-30 DIAGNOSIS — J438 Other emphysema: Secondary | ICD-10-CM | POA: Diagnosis not present

## 2018-06-30 DIAGNOSIS — I10 Essential (primary) hypertension: Secondary | ICD-10-CM | POA: Diagnosis not present

## 2018-06-30 DIAGNOSIS — G894 Chronic pain syndrome: Secondary | ICD-10-CM | POA: Diagnosis not present

## 2018-06-30 MED ORDER — HYDROCODONE-ACETAMINOPHEN 7.5-325 MG PO TABS: 1.0000 | ORAL_TABLET | Freq: Two times a day (BID) | ORAL | 0 refills | Status: DC | PRN

## 2018-06-30 MED ORDER — HYDROCODONE-ACETAMINOPHEN 7.5-325 MG PO TABS: ORAL_TABLET | ORAL | 0 refills | Status: DC

## 2018-06-30 NOTE — Telephone Encounter (Signed)
Pt in today for office visit. Pt states that Lisinopril is making him cough. States that Dr.Hawkins was suppose to be sending in a new medication. Tried to contact patient but wife states he was picking up the grand baby. Left message to return call letting patient know that Dr.Hawkins office is closed and we will try to touch base on Monday so Dr.Scott and send the med in.

## 2018-06-30 NOTE — Progress Notes (Signed)
Subjective:    Patient ID: [name], male    DOB: 1955-09-03, 63 y.o.   MRN: [id number]  HPI This patient was seen today for chronic pain.  Takes for bilateral leg pain  The medication list was reviewed and updated.   -Compliance with medication: takes hydrocodone 7.5/325 one bid  - Number patient states they take daily: one bid  -when was the last dose patient took? This morning.   The patient was advised the importance of maintaining medication and not using illegal substances with these.  Here for refills and follow up  The patient was educated that we can provide 3 monthly scripts for their medication, it is their responsibility to follow the instructions.  Side effects or complications from medications: none  Patient is aware that pain medications are meant to minimize the severity of the pain to allow their pain levels to improve to allow for better function. They are aware of that pain medications cannot totally remove their pain.  Due for UDT ( at least once per year) : last one oct 2019   Patient has had a persistent cough he states that it was told to him by his pulmonologist that this was due to his blood pressure medicine they switched him he is not sure what they switched to but states he has not got it yet we will try to clarify this.     Review of Systems  Constitutional: Negative for activity change.  HENT: Negative for congestion and rhinorrhea.   Respiratory: Negative for cough and shortness of breath.   Cardiovascular: Negative for chest pain.  Gastrointestinal: Negative for abdominal pain, diarrhea, nausea and vomiting.  Genitourinary: Negative for dysuria and hematuria.  Neurological: Negative for weakness and headaches.  Psychiatric/Behavioral: Negative for behavioral problems and confusion.       Objective:   Physical Exam Vitals signs reviewed.  Cardiovascular:     Rate and Rhythm: Normal rate and regular rhythm.     Heart sounds:  Normal heart sounds. No murmur.  Pulmonary:     Effort: Pulmonary effort is normal.     Breath sounds: Normal breath sounds.  Lymphadenopathy:     Cervical: No cervical adenopathy.  Neurological:     Mental Status: He is alert.  Psychiatric:        Behavior: Behavior normal.           Assessment & Plan:  COPD chronic cough Apparently will need to be on a different blood pressure medicine I recommend that we pursue forward with finding out from Dr. Luan Pulling what medicine they wanted him on in place of lisinopril  The patient was seen in followup for chronic pain. A review over at their current pain status was discussed. Drug registry was checked. Prescriptions were given. Discussion was held regarding the importance of compliance with medication as well as pain medication contract.  Time for questions regarding pain management plan occurred. Importance of regular followup visits was discussed. Patient was informed that medication may cause drowsiness and should not be combined  with other medications/alcohol or street drugs. Patient was cautioned that medication could cause drowsiness. If the patient feels medication is causing altered alertness then do not drive or operate dangerous equipment.  Patient does use Xanax for his severe chronic nerves he states it does not cause drowsiness or interfere with his current measures for his pain medicine we will keep his pain medicine as is I told the patient I did not feel comfortable  going up on the dose  Follow-up for chronic health visit as well as chronic pain and wellness in 3 months

## 2018-06-30 NOTE — Telephone Encounter (Signed)
Pt returned call and informed that Dr.Hawkins office closed and that we would get in touch with Dr.Hawkins on Monday to inquire about new BP meds. Pt verbalized understanding.

## 2018-07-03 ENCOUNTER — Other Ambulatory Visit: Payer: Self-pay | Admitting: Internal Medicine

## 2018-07-12 ENCOUNTER — Other Ambulatory Visit (HOSPITAL_COMMUNITY): Payer: Self-pay | Admitting: Psychiatry

## 2018-07-18 ENCOUNTER — Ambulatory Visit (INDEPENDENT_AMBULATORY_CARE_PROVIDER_SITE_OTHER): Payer: Medicare Other | Admitting: Psychiatry

## 2018-07-18 ENCOUNTER — Encounter (HOSPITAL_COMMUNITY): Payer: Self-pay | Admitting: Psychiatry

## 2018-07-18 VITALS — BP 132/86 | HR 72 | Ht 71.0 in | Wt 170.0 lb

## 2018-07-18 DIAGNOSIS — F418 Other specified anxiety disorders: Secondary | ICD-10-CM

## 2018-07-18 DIAGNOSIS — F5105 Insomnia due to other mental disorder: Secondary | ICD-10-CM | POA: Diagnosis not present

## 2018-07-18 MED ORDER — TEMAZEPAM 30 MG PO CAPS: 30.0000 mg | ORAL_CAPSULE | Freq: Every evening | ORAL | 2 refills | Status: DC | PRN

## 2018-07-18 MED ORDER — FLUOXETINE HCL 20 MG PO CAPS: 20.0000 mg | ORAL_CAPSULE | Freq: Every day | ORAL | 2 refills | Status: DC

## 2018-07-18 MED ORDER — ALPRAZOLAM 1 MG PO TABS: 1.0000 mg | ORAL_TABLET | Freq: Four times a day (QID) | ORAL | 2 refills | Status: DC

## 2018-07-18 MED ORDER — GABAPENTIN 300 MG PO CAPS: 600.0000 mg | ORAL_CAPSULE | Freq: Three times a day (TID) | ORAL | 2 refills | Status: DC

## 2018-07-18 MED ORDER — OLANZAPINE 10 MG PO TABS: 10.0000 mg | ORAL_TABLET | Freq: Every day | ORAL | 2 refills | Status: DC

## 2018-07-18 MED ORDER — FLUOXETINE HCL 40 MG PO CAPS: 40.0000 mg | ORAL_CAPSULE | Freq: Every day | ORAL | 2 refills | Status: DC

## 2018-07-18 NOTE — Progress Notes (Signed)
[facility] MD/PA/NP OP Progress Note  07/18/2018 8:48 AM [name]  MRN:  [id number]  Chief Complaint:  Chief Complaint    Anxiety; Depression; Follow-up     HPI: This patient is a 63 year old married male who lives with his wife in [place].  He is on disability for coronary artery disease and COPD.  He used to work for the [employer].  The patient returns for follow-up for his depression and anxiety.  He was last seen in July and obviously had missed some appointments.  He states he has had a rough time lately.  His adopted daughter had killed herself by hanging in the back of his house on Christmas of 2018.  This Christmas was the first 1 without her and it was quite difficult.  He and his wife have custody of her-53-year-old son and they also spent quite a bit of time with a 78-year-old granddaughter.  This granddaughter has been missing her mom and asking a lot of questions which is been hard on him and his wife.  He is not very amenable to counseling but is willing to talk to a minister and go back to church.  He denies suicidal ideation but is at times lays awake and thinks about all these things have happened to his daughter since she was adopted by him and his wife at age 63 and then got into a life of substance abuse.  He is trying to stay busy and active he does think the medications are still helpful. Visit Diagnosis:    ICD-10-CM   1. Depression with anxiety F41.8   2. 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 Anxiety   . Anxiety and depression   . ASCVD (arteriosclerotic cardiovascular disease)    inferior MI with circumflex PTCA in 1993;cath in 2000-50% OM 1&2,30% LAD ;2008-25% LAD, 80% nondominant right ,60% small OM 2,EF of 55% with severe basilar inferior hypokinesis; stress nuclear study in 04/2008 moderate inferior scar with peri-infarction ischemic  . Asthma   .  CAD (coronary artery disease)   . Carcinoma in situ of colon 2004   rectal polyp  . Colitis, ischemic ([place]) 2011  . COPD (chronic obstructive pulmonary disease) (HCC)    mild ;excercise induced hypoxemia by cp stress test ;asthma ,bronchitis,  . Depression   . Diverticulosis   . Gastritis 12/30/10   EGD Dr [name]  . GERD (gastroesophageal reflux disease)   . H. pylori infection 2004   treated  . Hemorrhoids   . Hiatal hernia   . Hyperlipidemia   . Hypertension   . Inflammatory polyps of colon with rectal bleeding ([place])   . Leukocytosis    [name]  . Lung nodule 07/16/2011  . Myocardial infarction [facility])    age 71  . Restless leg syndrome   . Schatzki's ring   . Sleep apnea    does not use CPAP:cannot tolerate, PCP aware  . Syncope   . Tobacco abuse    50 pack years continuing at one halp pack daily  . Tubular adenoma of colon 06/2009   Colonosocpy Dr [name]    Past Surgical History:  Procedure Laterality Date  . BIOPSY  12/19/2017   Procedure: BIOPSY;  Surgeon: [name], MD;  [facility];  Service: Endoscopy;;  gastric  . CARDIAC CATHETERIZATION    . CHOLECYSTECTOMY  2004  . COLONOSCOPY  06/2009   normal terminal ileum, segmental mild inflammation of sigmoid colon (bx unremarkable), polyp, tubular adenoma  . COLONOSCOPY N/A 07/31/2013   Dr.Rourk- redundant anal canal hemorrhoids, colonic diverticulosis, tubular adenoma  . COLONOSCOPY W/ POLYPECTOMY  2004   rectal polyp with carcinoma in situ removed via colonoscopy  . COLONOSCOPY WITH PROPOFOL N/A 09/15/2015   Dr. [name]: Scattered diverticula throughout the colon, 2 sessile polyps found in the descending colon and cecum, 5 mm in size.  Cecal polyp was sessile serrated polyp, descending colon polyp was a tubular adenoma.  He had a abnormal perianal exam along with grade 3 hemorrhoids.  Surveillance exam recommended for 5-year follow-up.  . COLONOSCOPY WITH PROPOFOL N/A 12/19/2017   Procedure: COLONOSCOPY  WITH PROPOFOL;  Surgeon: [name], MD;  [facility];  Service: Endoscopy;  Laterality: N/A;  7:30am  . CORONARY ANGIOPLASTY WITH STENT PLACEMENT  01/26/2012   "1; total is now 2"  . ESOPHAGOGASTRODUODENOSCOPY  02/2009   query Barrett's but bx negative  . ESOPHAGOGASTRODUODENOSCOPY  12/30/2010   [name],gastritis, dilated 24F, sm HH, 1 small ulcer, Duodenal erosions, benign bx  . ESOPHAGOGASTRODUODENOSCOPY (EGD) WITH ESOPHAGEAL DILATION N/A 09/12/2012   KLK:[id number] Schatzki's ring s/p Maloney dilator. Small hiatal hernia. negative path  . ESOPHAGOGASTRODUODENOSCOPY (EGD) WITH ESOPHAGEAL DILATION N/A 07/31/2013   Dr. [name]- normal egd, s/p [facility] dilation empirically. Normal small bowel biopsies   . ESOPHAGOGASTRODUODENOSCOPY (EGD) WITH PROPOFOL N/A 09/15/2015   Dr. [name]: Medium sized hiatal hernia, normal-appearing esophagus status post empiric dilation  . ESOPHAGOGASTRODUODENOSCOPY (EGD) WITH PROPOFOL N/A 12/19/2017   Procedure: ESOPHAGOGASTRODUODENOSCOPY (EGD) WITH PROPOFOL;  Surgeon: [name], MD;  [facility];  Service: Endoscopy;  Laterality: N/A;  . FLEXIBLE SIGMOIDOSCOPY  12/30/2010    [name],; internal hemorrhoids, anal papilla  . HAND SURGERY     surgical intervention for injury of the fingers of the left hand many years ago  . heart stent    . HEMORRHOID BANDING     Dr. [name]  . LEFT HEART CATHETERIZATION WITH CORONARY ANGIOGRAM N/A 01/26/2012   Procedure: LEFT HEART CATHETERIZATION WITH CORONARY ANGIOGRAM;  Surgeon: [name], MD;  Location: [facility] CATH LAB;  Service: Cardiovascular;  Laterality: N/A;  . [name] DILATION N/A 09/15/2015   Procedure: [name] DILATION;  Surgeon: [name], MD;  [facility];  Service: Endoscopy;  Laterality: N/A;  . [name] DILATION N/A 12/19/2017   Procedure: [name] DILATION;  Surgeon: [name], MD;  [facility];  Service: Endoscopy;  Laterality: N/A;  . NASAL  SEPTOPLASTY W/ TURBINOPLASTY  10/06/2011   Procedure: NASAL SEPTOPLASTY WITH TURBINATE REDUCTION;  Surgeon: Izora Gala, MD;  Location: [place];  Service: ENT;  Laterality: Bilateral;  . PERCUTANEOUS CORONARY STENT INTERVENTION (PCI-S) N/A 01/26/2012   Procedure: PERCUTANEOUS CORONARY STENT INTERVENTION (PCI-S);  Surgeon: [name], MD;  Location: [facility] CATH LAB;  Service: Cardiovascular;  Laterality: N/A;  . POLYPECTOMY  09/15/2015   Procedure: POLYPECTOMY;  Surgeon: [name], MD;  [facility];  Service: Endoscopy;;  Cecal polyp removed via cold snare/ Descending colon polyp removed via cold snare  . POLYPECTOMY  12/19/2017   Procedure: POLYPECTOMY;  Surgeon: [name], MD;  [facility];  Service: Endoscopy;;  colon    Family Psychiatric History: See below  Family History:  Family History  Problem Relation Age of Onset  . Lung disease Father  deceased, black lung  . Heart disease Mother        blood clots  . Depression Mother   . Cancer Paternal Uncle        unknown type  . Cancer Maternal Aunt        unknown type  . Kidney failure Maternal Uncle   . Hypertension Brother   . Colon cancer Neg Hx   . ADD / ADHD Neg Hx   . Alcohol abuse Neg Hx   . Drug abuse Neg Hx   . Anxiety disorder Neg Hx   . Bipolar disorder Neg Hx   . Dementia Neg Hx   . OCD Neg Hx   . Paranoid behavior Neg Hx   . Schizophrenia Neg Hx   . Physical abuse Neg Hx   . Sexual abuse Neg Hx   . Seizures Neg Hx     Social History:  Social History   Socioeconomic History  . Marital status: Married    Spouse name: Not on file  . Number of children: 3  . Years of education: Not on file  . Highest education level: Not on file  Occupational History  . Occupation: disable    Employer: RETIRED    Comment: [employer]  Social Needs  . Financial resource strain: Not on file  . Food insecurity:    Worry: Not on file    Inability: Not on file  . Transportation needs:     Medical: Not on file    Non-medical: Not on file  Tobacco Use  . Smoking status: Current Some Day Smoker    Packs/day: 0.50    Years: 30.00    Pack years: 15.00    Types: Cigarettes    Start date: 07/31/1969  . Smokeless tobacco: Former Systems developer  . Tobacco comment: cutting back trying to quit  Substance and Sexual Activity  . Alcohol use: Yes    Comment: Drinks a beer occasionally  . Drug use: No  . Sexual activity: Not Currently  Lifestyle  . Physical activity:    Days per week: Not on file    Minutes per session: Not on file  . Stress: Not on file  Relationships  . Social connections:    Talks on phone: Not on file    Gets together: Not on file    Attends religious service: Not on file    Active member of club or organization: Not on file    Attends meetings of clubs or organizations: Not on file    Relationship status: Not on file  Other Topics Concern  . Not on file  Social History Narrative   3 stepchildren    Allergies: No Known Allergies  Metabolic Disorder Labs: Lab Results  Component Value Date   HGBA1C 5.3 07/24/2015   MPG 103 10/29/2013   MPG 97 04/30/2013   No results found for: PROLACTIN Lab Results  Component Value Date   CHOL 138 12/13/2017   TRIG 304 (H) 12/13/2017   HDL 34 (L) 12/13/2017   CHOLHDL 4.1 12/13/2017   VLDL 64 (H) 06/20/2014   LDLCALC 43 12/13/2017   LDLCALC 68 10/01/2016   Lab Results  Component Value Date   TSH 1.011 02/09/2010    Therapeutic Level Labs: No results found for: LITHIUM No results found for: VALPROATE No components found for:  CBMZ  Current Medications: Current Outpatient Medications  Medication Sig Dispense Refill  . albuterol (PROVENTIL) (2.5 MG/3ML) 0.083% nebulizer solution Take 2.5 mg by nebulization every 6 (  six) hours as needed for wheezing or shortness of breath.     . ALPRAZolam (XANAX) 1 MG tablet Take 1 tablet (1 mg total) by mouth 4 (four) times daily. 120 tablet 2  . aspirin EC 81 MG tablet Take  81 mg by mouth daily.    Marland Kitchen atorvastatin (LIPITOR) 80 MG tablet TAKE 1 TABLET BY MOUTH EVERY DAY 90 tablet 3  . budesonide-formoterol (SYMBICORT) 80-4.5 MCG/ACT inhaler Inhale 2 puffs into the lungs 2 (two) times daily. 1 Inhaler 12  . FLUoxetine (PROZAC) 20 MG capsule Take 1 capsule (20 mg total) by mouth daily. 90 capsule 2  . FLUoxetine (PROZAC) 40 MG capsule Take 1 capsule (40 mg total) by mouth daily. 90 capsule 2  . fluticasone (FLONASE) 50 MCG/ACT nasal spray Place 2 sprays into both nostrils daily. (Patient taking differently: Place 2 sprays into both nostrils daily as needed for allergies. ) 16 g 5  . gabapentin (NEURONTIN) 300 MG capsule Take 2 capsules (600 mg total) by mouth 3 (three) times daily. 180 capsule 2  . HYDROcodone-acetaminophen (NORCO) 7.5-325 MG tablet Take 1 tablet by mouth 2 (two) times daily as needed for moderate pain. 60 tablet 0  . HYDROcodone-acetaminophen (NORCO) 7.5-325 MG tablet 1 tablet twice daily as needed 60 tablet 0  . HYDROcodone-acetaminophen (NORCO) 7.5-325 MG tablet 1 twice daily as needed pain 60 tablet 0  . lisinopril (PRINIVIL,ZESTRIL) 2.5 MG tablet TAKE 1 TABLET (2.5 MG TOTAL) BY MOUTH DAILY. **STOP VERAPAMIL** 30 tablet 12  . metoprolol succinate (TOPROL-XL) 25 MG 24 hr tablet TAKE 1 TABLET BY MOUTH EVERY DAY 90 tablet 1  . metoprolol succinate (TOPROL-XL) 50 MG 24 hr tablet Take 1 tablet (50 mg total) by mouth daily. 90 tablet 1  . nitroGLYCERIN (NITROSTAT) 0.4 MG SL tablet Place 0.4 mg under the tongue every 5 (five) minutes as needed for chest pain.    Marland Kitchen OLANZapine (ZYPREXA) 10 MG tablet Take 1 tablet (10 mg total) by mouth at bedtime. 90 tablet 2  . pantoprazole (PROTONIX) 40 MG tablet TAKE 1 TABLET BY MOUTH TWICE A DAY 180 tablet 3  . rOPINIRole (REQUIP) 2 MG tablet TAKE 1 TABLET (2 MG TOTAL) BY MOUTH DAILY AT BEDTIME. 90 tablet 2  . temazepam (RESTORIL) 30 MG capsule Take 1 capsule (30 mg total) by mouth at bedtime as needed for sleep. 30 capsule  2  . TRELEGY ELLIPTA 100-62.5-25 MCG/INH AEPB INHALE ONE PUFF ONCE A DAY     No current facility-administered medications for this visit.      Musculoskeletal: Strength & Muscle Tone: within normal limits Gait & Station: normal Patient leans: N/A  Psychiatric Specialty Exam: Review of Systems  Musculoskeletal: Positive for back pain.  Psychiatric/Behavioral: Positive for depression. The patient has insomnia.   All other systems reviewed and are negative.   Blood pressure 132/86, pulse 72, height '5\' 11"'  (1.803 m), weight 170 lb (77.1 kg), SpO2 98 %.Body mass index is 23.71 kg/m.  General Appearance: Casual and Fairly Groomed  Eye Contact:  Fair  Speech:  Clear and Coherent  Volume:  Decreased  Mood:  Dysphoric  Affect:  Depressed  Thought Process:  Goal Directed  Orientation:  Full (Time, Place, and Person)  Thought Content: Rumination   Suicidal Thoughts:  No  Homicidal Thoughts:  No  Memory:  Immediate;   Good Recent;   Good Remote;   Fair  Judgement:  Fair  Insight:  Fair  Psychomotor Activity:  Decreased  Concentration:  Concentration:  Good and Attention Span: Good  Recall:  Good  Fund of Knowledge: Fair  Language: Good  Akathisia:  No  Handed:  Right  AIMS (if indicated): not done  Assets:  Communication Skills Desire for Improvement Resilience Social Support Talents/Skills  ADL's:  Intact  Cognition: WNL  Sleep:  Fair   Screenings: GAD-7     Office Visit from 03/30/2018 in [facility]  Total GAD-7 Score  4    PHQ2-9     Office Visit from 03/30/2018 in [facility] Office Visit from 05/19/2017 in [facility] Office Visit from 10/21/2015 in [facility] Office Visit from 04/23/2015 in [facility]  PHQ-2 Total Score  2  0  0  0  PHQ-9 Total Score  12  -  -  -       Assessment and Plan: This patient is a 63 year old male with a history of depression and anxiety.  His symptoms were  worsened after his daughter suicide.  Unfortunately is living in the same home where the suicide took place so there are constant reminders.  He is thinking of moving which probably would not be a bad idea.  I strongly urged him to get into counseling at least through his church and he agrees.  For now he will continue Prozac 60 mg daily for depression, gabapentin 600 mg 3 times daily for anxiety, Xanax 1 mg 4 times daily for anxiety, olanzapine 10 mg at bedtime for mood stabilization and Restoril 30 mg at bedtime for sleep.  He will return to see me in 3 months   [name], MD 07/18/2018, 8:48 AM

## 2018-08-09 ENCOUNTER — Other Ambulatory Visit (HOSPITAL_COMMUNITY): Payer: Self-pay | Admitting: Psychiatry

## 2018-08-15 ENCOUNTER — Ambulatory Visit: Payer: Medicare Other | Admitting: Family Medicine

## 2018-08-15 ENCOUNTER — Encounter: Payer: Self-pay | Admitting: Family Medicine

## 2018-08-15 VITALS — BP 132/80 | Temp 97.7°F | Ht 71.0 in | Wt 180.0 lb

## 2018-08-15 DIAGNOSIS — B029 Zoster without complications: Secondary | ICD-10-CM | POA: Diagnosis not present

## 2018-08-15 MED ORDER — HYDROCODONE-ACETAMINOPHEN 10-325 MG PO TABS: ORAL_TABLET | ORAL | 0 refills | Status: DC

## 2018-08-15 MED ORDER — VALACYCLOVIR HCL 1 G PO TABS: 1000.0000 mg | ORAL_TABLET | Freq: Three times a day (TID) | ORAL | 0 refills | Status: DC

## 2018-08-15 NOTE — Patient Instructions (Signed)
Shingles    Shingles is an infection. It gives you a painful skin rash and blisters that have fluid in them. Shingles is caused by the same germ (virus) that causes chickenpox.  Shingles only happens in people who:   Have had chickenpox.   Have been given a shot of medicine (vaccine) to protect against chickenpox. Shingles is rare in this group.  The first symptoms of shingles may be itching, tingling, or pain in an area on your skin. A rash will show on your skin a few days or weeks later. The rash is likely to be on one side of your body. The rash usually has a shape like a belt or a band. Over time, the rash turns into fluid-filled blisters. The blisters will break open, change into scabs, and dry up. Medicines may:   Help with pain and itching.   Help you get better sooner.   Help to prevent long-term problems.  Follow these instructions at home:  Medicines   Take over-the-counter and prescription medicines only as told by your doctor.   Put on an anti-itch cream or numbing cream where you have a rash, blisters, or scabs. Do this as told by your doctor.  Helping with itching and discomfort     Put cold, wet cloths (cold compresses) on the area of the rash or blisters as told by your doctor.   Cool baths can help you feel better. Try adding baking soda or dry oatmeal to the water to lessen itching. Do not bathe in hot water.  Blister and rash care   Keep your rash covered with a loose bandage (dressing).   Wear loose clothing that does not rub on your rash.   Keep your rash and blisters clean. To do this, wash the area with mild soap and cool water as told by your doctor.   Check your rash every day for signs of infection. Check for:  ? More redness, swelling, or pain.  ? Fluid or blood.  ? Warmth.  ? Pus or a bad smell.   Do not scratch your rash. Do not pick at your blisters. To help you to not scratch:  ? Keep your fingernails clean and cut short.  ? Wear gloves or mittens when you sleep, if  scratching is a problem.  General instructions   Rest as told by your doctor.   Keep all follow-up visits as told by your doctor. This is important.   Wash your hands often with soap and water. If soap and water are not available, use hand sanitizer. Doing this lowers your chance of getting a skin infection caused by germs (bacteria).   Your infection can cause chickenpox in people who have never had chickenpox or never got a shot of chickenpox vaccine. If you have blisters that did not change into scabs yet, try not to touch other people or be around other people, especially:  ? Babies.  ? Pregnant women.  ? Children who have areas of red, itchy, or rough skin (eczema).  ? Very old people who have transplants.  ? People who have a long-term (chronic) sickness, like cancer or AIDS.  Contact a doctor if:   Your pain does not get better with medicine.   Your pain does not get better after the rash heals.   You have any signs of infection in the rash area. These signs include:  ? More redness, swelling, or pain around the rash.  ? Fluid or blood coming from   the rash.  ? The rash area feeling warm to the touch.  ? Pus or a bad smell coming from the rash.  Get help right away if:   The rash is on your face or nose.   You have pain in your face or pain by your eye.   You lose feeling on one side of your face.   You have trouble seeing.   You have ear pain, or you have ringing in your ear.   You have a loss of taste.   Your condition gets worse.  Summary   Shingles gives you a painful skin rash and blisters that have fluid in them.   Shingles is an infection. It is caused by the same germ (virus) that causes chickenpox.   Keep your rash covered with a loose bandage (dressing). Wear loose clothing that does not rub on your rash.   If you have blisters that did not change into scabs yet, try not to touch other people or be around people.  This information is not intended to replace advice given to you by  your health care provider. Make sure you discuss any questions you have with your health care provider.  Document Released: 11/17/2007 Document Revised: 02/02/2017 Document Reviewed: 02/02/2017  Elsevier Interactive Patient Education  2019 [employer]

## 2018-08-15 NOTE — Progress Notes (Signed)
   Subjective:    Patient ID: [name], male    DOB: 06/17/1955, 63 y.o.   MRN: [id number]  HPI  Patient is here today with complaints of a rash that has developed in his private area.  He noticed this 5-6 days ago. He states the area is swollen, itches,red, warm to the touch.  He says it is in one solid area.  He has put sulfate salve on it and it helps some.  No other symptoms.  Review of Systems  Constitutional: Negative for activity change.  HENT: Negative for congestion and rhinorrhea.   Respiratory: Negative for cough and shortness of breath.   Cardiovascular: Negative for chest pain.  Gastrointestinal: Negative for abdominal pain, diarrhea, nausea and vomiting.  Genitourinary: Negative for dysuria and hematuria.  Neurological: Negative for weakness and headaches.  Psychiatric/Behavioral: Negative for behavioral problems and confusion.       Objective:   Physical Exam Vitals signs reviewed.  Constitutional:      General: He is not in acute distress. HENT:     Head: Normocephalic and atraumatic.  Eyes:     General:        Right eye: No discharge.        Left eye: No discharge.  Neck:     Trachea: No tracheal deviation.  Cardiovascular:     Rate and Rhythm: Normal rate and regular rhythm.     Heart sounds: Normal heart sounds. No murmur.  Pulmonary:     Effort: Pulmonary effort is normal. No respiratory distress.     Breath sounds: Normal breath sounds.  Lymphadenopathy:     Cervical: No cervical adenopathy.  Skin:    General: Skin is warm and dry.  Neurological:     Mental Status: He is alert.     Coordination: Coordination normal.  Psychiatric:        Behavior: Behavior normal.     The rash on the right leg is consistent with shingles worse in the groin region extends down the leg  Patient was cautioned to minimize Xanax while taking his pain medicine potentially cutting the Xanax dosing in half    Assessment & Plan:  Patient has significant  case of shingles that runs from the groin down the inner aspect of his right leg His current pain medicine is not touching it We will shift to hydrocodone 10 mg maximum 5/day 40 tablets May get this filled today Take it in place of the other hydrocodone Continue the gabapentin Valtrex 3 times daily for 7 days If progressive troubles or worse follow-up Otherwise recheck in 2 weeks

## 2018-08-24 ENCOUNTER — Other Ambulatory Visit: Payer: Self-pay | Admitting: Family Medicine

## 2018-08-24 ENCOUNTER — Telehealth: Payer: Self-pay | Admitting: Family Medicine

## 2018-08-24 MED ORDER — HYDROCODONE-ACETAMINOPHEN 10-325 MG PO TABS: ORAL_TABLET | ORAL | 0 refills | Status: DC

## 2018-08-24 NOTE — Telephone Encounter (Signed)
Left message to return call 

## 2018-08-24 NOTE — Telephone Encounter (Signed)
Diagnosed with shingles on 3/3. Pt states he is finished with valtrex and only has enough hydrocodone to last til morning. He is taking one four times a day.

## 2018-08-24 NOTE — Telephone Encounter (Signed)
Typically do not have to do a second round of Valtrex  I did send in the hydrocodone  Should be able to taper back to his usual hydrocodone over the next 7 to 10 days if unable to do so he needs to do a follow-up visit

## 2018-08-24 NOTE — Telephone Encounter (Signed)
REQUESTING REFILL FOR  valACYclovir (VALTREX) 1000 MG tablet   HYDROcodone-acetaminophen (NORCO) 10-325 MG tablet   Pharmacy:  [id number] [place], [place]

## 2018-08-25 NOTE — Telephone Encounter (Signed)
Pt returned call    Pt verbalized understanding

## 2018-08-27 ENCOUNTER — Telehealth: Payer: Self-pay | Admitting: Family Medicine

## 2018-08-27 NOTE — Telephone Encounter (Signed)
It should be noted that this patient is on pain medicine limited quantity through [place]  Patient is also on nerve medication through his psychiatrist  Unfortunately both are clinically indicated and I have already counseled the patient regarding best ways to minimize his risk  I have also counseled the patient use the least amount of pain medicine and the least amount of nerve pills

## 2018-08-28 ENCOUNTER — Ambulatory Visit (INDEPENDENT_AMBULATORY_CARE_PROVIDER_SITE_OTHER): Payer: Medicare Other | Admitting: Family Medicine

## 2018-08-28 ENCOUNTER — Encounter: Payer: Self-pay | Admitting: Family Medicine

## 2018-08-28 ENCOUNTER — Other Ambulatory Visit: Payer: Self-pay

## 2018-08-28 VITALS — BP 120/90 | Wt 180.2 lb

## 2018-08-28 DIAGNOSIS — L0390 Cellulitis, unspecified: Secondary | ICD-10-CM | POA: Diagnosis not present

## 2018-08-28 DIAGNOSIS — B958 Unspecified staphylococcus as the cause of diseases classified elsewhere: Secondary | ICD-10-CM

## 2018-08-28 MED ORDER — DOXYCYCLINE HYCLATE 100 MG PO TABS: ORAL_TABLET | ORAL | 0 refills | Status: DC

## 2018-08-28 NOTE — Progress Notes (Signed)
   Subjective:    Patient ID: [name], male    DOB: 07/28/1955, 64 y.o.   MRN: [id number]  HPI Pt here today for 2 week follow up on shingles. Pt states he is now having bumps on the back of his legs. Pt states the area on his left leg has been draining.    Review of Systems  Constitutional: Negative for activity change, appetite change and fatigue.  HENT: Negative for congestion and rhinorrhea.   Respiratory: Negative for cough and shortness of breath.   Cardiovascular: Negative for chest pain and leg swelling.  Gastrointestinal: Negative for abdominal pain, nausea and vomiting.  Neurological: Negative for dizziness and headaches.  Psychiatric/Behavioral: Negative for agitation and behavioral problems.       Objective:   Physical Exam Vitals signs reviewed.  Constitutional:      General: He is not in acute distress. HENT:     Head: Normocephalic and atraumatic.  Eyes:     General:        Right eye: No discharge.        Left eye: No discharge.  Neck:     Trachea: No tracheal deviation.  Cardiovascular:     Rate and Rhythm: Normal rate and regular rhythm.     Heart sounds: Normal heart sounds. No murmur.  Pulmonary:     Effort: Pulmonary effort is normal. No respiratory distress.     Breath sounds: Normal breath sounds.  Lymphadenopathy:     Cervical: No cervical adenopathy.  Skin:    General: Skin is warm and dry.  Neurological:     Mental Status: He is alert.     Coordination: Coordination normal.  Psychiatric:        Behavior: Behavior normal.           Assessment & Plan:  Significant staph infection left leg warm compresses frequency doxycycline on a regular basis follow-up if progressive troubles or if worse  Warning signs were discussed We will go ahead with pain medicine continue 4 or 5/day but over time we will taper back down to his usual amount

## 2018-09-11 ENCOUNTER — Other Ambulatory Visit: Payer: Self-pay | Admitting: Family Medicine

## 2018-09-11 ENCOUNTER — Telehealth: Payer: Self-pay | Admitting: Family Medicine

## 2018-09-11 MED ORDER — HYDROCODONE-ACETAMINOPHEN 10-325 MG PO TABS: ORAL_TABLET | ORAL | 0 refills | Status: DC

## 2018-09-11 NOTE — Telephone Encounter (Signed)
I sent in a prescription for 30 tablets He should use these as needed to help with his pain but use sparingly When he finishes this he needs to be able to go to his usual pain medicine at that point

## 2018-09-11 NOTE — Telephone Encounter (Signed)
Needs a refill for his pain meds for shingles.  [facility] [address] [place]

## 2018-09-11 NOTE — Telephone Encounter (Signed)
Discussed with pt. Pt verbalized understanding.  °

## 2018-09-11 NOTE — Telephone Encounter (Signed)
Hydrocodone 10 mg #40 last filled 08/24/2018

## 2018-09-25 ENCOUNTER — Ambulatory Visit: Payer: Medicare Other | Admitting: Family Medicine

## 2018-09-25 ENCOUNTER — Telehealth: Payer: Self-pay | Admitting: Family Medicine

## 2018-09-25 NOTE — Telephone Encounter (Signed)
Wife said he had changed the dosage of pain meds during his bout with shingles and now to go back to the old way of taking it the pharmacy needs a new script.  [facility] [place]

## 2018-09-25 NOTE — Telephone Encounter (Signed)
Last pain management was 06/30/18. Seen for shingles on 08/15/18

## 2018-09-26 ENCOUNTER — Other Ambulatory Visit: Payer: Self-pay | Admitting: Family Medicine

## 2018-09-26 MED ORDER — HYDROCODONE-ACETAMINOPHEN 7.5-325 MG PO TABS: ORAL_TABLET | ORAL | 0 refills | Status: DC

## 2018-09-26 NOTE — Telephone Encounter (Signed)
Please inform family I did send it in today Set up a virtual visit in 3 weeks for pain management-at that time we will send in 3 scripts

## 2018-09-26 NOTE — Telephone Encounter (Signed)
Patient notified and appointment was scheduled for 3 weeks from now.

## 2018-09-26 NOTE — Telephone Encounter (Signed)
Pt's wife calling checking status of refill. She states he is out

## 2018-10-02 ENCOUNTER — Telehealth: Payer: Self-pay | Admitting: *Deleted

## 2018-10-02 NOTE — Telephone Encounter (Signed)
Called to review meds. NA, VM full

## 2018-10-03 ENCOUNTER — Encounter: Payer: Self-pay | Admitting: Cardiology

## 2018-10-03 ENCOUNTER — Telehealth (INDEPENDENT_AMBULATORY_CARE_PROVIDER_SITE_OTHER): Payer: Medicare Other | Admitting: Cardiology

## 2018-10-03 VITALS — BP 135/107 | HR 93 | Ht 71.0 in | Wt 180.0 lb

## 2018-10-03 DIAGNOSIS — I10 Essential (primary) hypertension: Secondary | ICD-10-CM

## 2018-10-03 DIAGNOSIS — I25118 Atherosclerotic heart disease of native coronary artery with other forms of angina pectoris: Secondary | ICD-10-CM | POA: Diagnosis not present

## 2018-10-03 DIAGNOSIS — E782 Mixed hyperlipidemia: Secondary | ICD-10-CM

## 2018-10-03 NOTE — Progress Notes (Signed)

## 2018-10-03 NOTE — Progress Notes (Signed)
Virtual Visit via Telephone Note   This visit type was conducted due to national recommendations for restrictions regarding the COVID-19 Pandemic (e.g. social distancing) in an effort to limit this patient's exposure and mitigate transmission in our community.  Due to his co-morbid illnesses, this patient is at least at moderate risk for complications without adequate follow up.  This format is felt to be most appropriate for this patient at this time.  The patient did not have access to video technology/had technical difficulties with video requiring transitioning to audio format only (telephone).  All issues noted in this document were discussed and addressed.  No physical exam could be performed with this format.  Please refer to the patient's chart for his  consent to telehealth for [facility].   Evaluation Performed:  Follow-up visit  Date:  10/03/2018   ID:  [name], DOB 01-11-1956, MRN [id number]  Patient Location: Home Provider Location: Home  PCP:  [name], MD  Cardiologist:  Carlyle Dolly, MD  Electrophysiologist:  None   Chief Complaint:  1 year follow up  History of Present Illness:    [name] is a 63 y.o. male seen today for follow up of the following medical problems.   1. 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 LVEF 50-55% by LV gram.  08/2011 echo LVEF 02%, grade I diastolic dysfunction.  03/2016 echo: LVEF 77%, normal diastolic function   09/1285 nuclear stress: inferior /inferolaterla infarct without ischemia. LVEF 45%  - no recent chest pain. Mild SOB at rest and with activity. Some cough. Compliant with inhalers -compliant with meds   2. Hyperlipidemia 12/2017 TC 138 TG 304 HDL 34 LDL 43 - compliant with statin  3. HTN - compliant with meds - 08/2018 pcp visit 120/90  4.  COPD - followed by pcp   5. OSA - does not use CPAP machine due to discomfort   6. GERD/Dysphagia - followed by GI     SH: enjoys working in yard, fishing. Daughter fairly recently committed suicide by hanging herself      The patient does not have symptoms concerning for COVID-19 infection (fever, chills, cough, or new shortness of breath).    Past Medical History:  Diagnosis Date  . Anxiety   . Anxiety and depression   . ASCVD (arteriosclerotic cardiovascular disease)    inferior MI with circumflex PTCA in 1993;cath in 2000-50% OM 1&2,30% LAD ;2008-25% LAD, 80% nondominant right ,60% small OM 2,EF of 55% with severe basilar inferior hypokinesis; stress nuclear study in 04/2008 moderate inferior scar with peri-infarction ischemic  . Asthma   . CAD (coronary artery disease)   . Carcinoma in situ of colon 2004   rectal polyp  . Colitis, ischemic ([place]) 2011  . COPD (chronic obstructive pulmonary disease) (HCC)    mild ;excercise induced hypoxemia by cp stress test ;asthma ,bronchitis,  . Depression   . Diverticulosis   . Gastritis 12/30/10   EGD Dr [name]  . GERD (gastroesophageal reflux disease)   . H. pylori infection 2004   treated  . Hemorrhoids   . Hiatal hernia   . Hyperlipidemia   . Hypertension   . Inflammatory polyps of colon with rectal bleeding ([place])   . Leukocytosis    [name]  . Lung  nodule 07/16/2011  . Myocardial infarction [facility])    age 89  . Restless leg syndrome   . Schatzki's ring   . Sleep apnea    does not use CPAP:cannot tolerate, PCP aware  . Syncope   . Tobacco abuse    50 pack years continuing at one halp pack daily  . Tubular adenoma of colon 06/2009   Colonosocpy Dr [name]   Past Surgical History:  Procedure Laterality Date  . BIOPSY  12/19/2017   Procedure: BIOPSY;  Surgeon: [name], MD;  [facility];  Service: Endoscopy;;  gastric  . CARDIAC CATHETERIZATION    . CHOLECYSTECTOMY  2004  .  COLONOSCOPY  06/2009   normal terminal ileum, segmental mild inflammation of sigmoid colon (bx unremarkable), polyp, tubular adenoma  . COLONOSCOPY N/A 07/31/2013   Dr.Rourk- redundant anal canal hemorrhoids, colonic diverticulosis, tubular adenoma  . COLONOSCOPY W/ POLYPECTOMY  2004   rectal polyp with carcinoma in situ removed via colonoscopy  . COLONOSCOPY WITH PROPOFOL N/A 09/15/2015   Dr. [name]: Scattered diverticula throughout the colon, 2 sessile polyps found in the descending colon and cecum, 5 mm in size.  Cecal polyp was sessile serrated polyp, descending colon polyp was a tubular adenoma.  He had a abnormal perianal exam along with grade 3 hemorrhoids.  Surveillance exam recommended for 5-year follow-up.  . COLONOSCOPY WITH PROPOFOL N/A 12/19/2017   Procedure: COLONOSCOPY WITH PROPOFOL;  Surgeon: [name], MD;  [facility];  Service: Endoscopy;  Laterality: N/A;  7:30am  . CORONARY ANGIOPLASTY WITH STENT PLACEMENT  01/26/2012   "1; total is now 2"  . ESOPHAGOGASTRODUODENOSCOPY  02/2009   query Barrett's but bx negative  . ESOPHAGOGASTRODUODENOSCOPY  12/30/2010   [name],gastritis, dilated 3F, sm HH, 1 small ulcer, Duodenal erosions, benign bx  . ESOPHAGOGASTRODUODENOSCOPY (EGD) WITH ESOPHAGEAL DILATION N/A 09/12/2012   JSE:[id number] Schatzki's ring s/p Maloney dilator. Small hiatal hernia. negative path  . ESOPHAGOGASTRODUODENOSCOPY (EGD) WITH ESOPHAGEAL DILATION N/A 07/31/2013   Dr. [name]- normal egd, s/p [facility] dilation empirically. Normal small bowel biopsies   . ESOPHAGOGASTRODUODENOSCOPY (EGD) WITH PROPOFOL N/A 09/15/2015   Dr. [name]: Medium sized hiatal hernia, normal-appearing esophagus status post empiric dilation  . ESOPHAGOGASTRODUODENOSCOPY (EGD) WITH PROPOFOL N/A 12/19/2017   Procedure: ESOPHAGOGASTRODUODENOSCOPY (EGD) WITH PROPOFOL;  Surgeon: [name], MD;  [facility];  Service: Endoscopy;  Laterality: N/A;  . FLEXIBLE SIGMOIDOSCOPY   12/30/2010    [name],; internal hemorrhoids, anal papilla  . HAND SURGERY     surgical intervention for injury of the fingers of the left hand many years ago  . heart stent    . HEMORRHOID BANDING     Dr. [name]  . LEFT HEART CATHETERIZATION WITH CORONARY ANGIOGRAM N/A 01/26/2012   Procedure: LEFT HEART CATHETERIZATION WITH CORONARY ANGIOGRAM;  Surgeon: [name], MD;  Location: [facility] CATH LAB;  Service: Cardiovascular;  Laterality: N/A;  . [name] DILATION N/A 09/15/2015   Procedure: [name] DILATION;  Surgeon: [name], MD;  [facility];  Service: Endoscopy;  Laterality: N/A;  . [name] DILATION N/A 12/19/2017   Procedure: [name] DILATION;  Surgeon: [name], MD;  [facility];  Service: Endoscopy;  Laterality: N/A;  . NASAL SEPTOPLASTY W/ TURBINOPLASTY  10/06/2011   Procedure: NASAL SEPTOPLASTY WITH TURBINATE REDUCTION;  Surgeon: Izora Gala, MD;  Location: [place];  Service: ENT;  Laterality: Bilateral;  . PERCUTANEOUS CORONARY STENT INTERVENTION (PCI-S) N/A 01/26/2012  Procedure: PERCUTANEOUS CORONARY STENT INTERVENTION (PCI-S);  Surgeon: [name], MD;  Location: [facility] CATH LAB;  Service: Cardiovascular;  Laterality: N/A;  . POLYPECTOMY  09/15/2015   Procedure: POLYPECTOMY;  Surgeon: [name], MD;  [facility];  Service: Endoscopy;;  Cecal polyp removed via cold snare/ Descending colon polyp removed via cold snare  . POLYPECTOMY  12/19/2017   Procedure: POLYPECTOMY;  Surgeon: [name], MD;  [facility];  Service: Endoscopy;;  colon     Current Meds  Medication Sig  . albuterol (PROVENTIL) (2.5 MG/3ML) 0.083% nebulizer solution Take 2.5 mg by nebulization every 6 (six) hours as needed for wheezing or shortness of breath.   . ALPRAZolam (XANAX) 1 MG tablet Take 1 tablet (1 mg total) by mouth 4 (four) times daily.  Marland Kitchen aspirin EC 81 MG tablet Take 81 mg by mouth daily.  Marland Kitchen atorvastatin (LIPITOR) 80 MG tablet TAKE 1  TABLET BY MOUTH EVERY DAY  . budesonide-formoterol (SYMBICORT) 80-4.5 MCG/ACT inhaler Inhale 2 puffs into the lungs 2 (two) times daily.  Marland Kitchen FLUoxetine (PROZAC) 20 MG capsule Take 1 capsule (20 mg total) by mouth daily.  Marland Kitchen FLUoxetine (PROZAC) 40 MG capsule Take 1 capsule (40 mg total) by mouth daily.  . fluticasone (FLONASE) 50 MCG/ACT nasal spray Place 2 sprays into both nostrils daily. (Patient taking differently: Place 2 sprays into both nostrils daily as needed for allergies. )  . gabapentin (NEURONTIN) 300 MG capsule TAKE 2 CAPSULES BY MOUTH 3 TIMES A DAY  . HYDROcodone-acetaminophen (NORCO) 7.5-325 MG tablet Take 1 tablet by mouth 2 (two) times daily as needed for moderate pain.  Marland Kitchen lisinopril (PRINIVIL,ZESTRIL) 2.5 MG tablet TAKE 1 TABLET (2.5 MG TOTAL) BY MOUTH DAILY. **STOP VERAPAMIL**  . metoprolol succinate (TOPROL-XL) 25 MG 24 hr tablet TAKE 1 TABLET BY MOUTH EVERY DAY  . metoprolol succinate (TOPROL-XL) 50 MG 24 hr tablet Take 1 tablet (50 mg total) by mouth daily.  . nitroGLYCERIN (NITROSTAT) 0.4 MG SL tablet Place 0.4 mg under the tongue every 5 (five) minutes as needed for chest pain.  Marland Kitchen OLANZapine (ZYPREXA) 10 MG tablet Take 1 tablet (10 mg total) by mouth at bedtime.  . temazepam (RESTORIL) 30 MG capsule Take 1 capsule (30 mg total) by mouth at bedtime as needed for sleep.  . TRELEGY ELLIPTA 100-62.5-25 MCG/INH AEPB INHALE ONE PUFF ONCE A DAY     Allergies:   Patient has no known allergies.   Social History   Tobacco Use  . Smoking status: Current Some Day Smoker    Packs/day: 0.50    Years: 30.00    Pack years: 15.00    Types: Cigarettes    Start date: 07/31/1969  . Smokeless tobacco: Former Systems developer  . Tobacco comment: cutting back trying to quit  Substance Use Topics  . Alcohol use: Yes    Comment: Drinks a beer occasionally  . Drug use: No     Family Hx: The patient's family history includes Cancer in his maternal aunt and paternal uncle; Depression in his mother;  Heart disease in his mother; Hypertension in his brother; Kidney failure in his maternal uncle; Lung disease in his father. There is no history of Colon cancer, ADD / ADHD, Alcohol abuse, Drug abuse, Anxiety disorder, Bipolar disorder, Dementia, OCD, Paranoid behavior, Schizophrenia, Physical abuse, Sexual abuse, or Seizures.  ROS:   Please see the history of present illness.     All other systems reviewed and are negative.  Prior CV studies:   The following studies were reviewed today:  02/2010 Cath DESCRIPTION OF PROCEDURE: The risks and indication were explained. Consent was signed and placed on the chart. A 4-French arterial sheath was placed in the right femoral artery using a modified Seldinger technique. A standard catheters including a JL-4, 3DRC, and angled pigtail were used. All catheter exchanges were made over wire. There were no apparent complications. Central aortic pressure 109/67 with a mean of 86. LV pressure 110/70 with an EDP of 28. There was no aortic stenosis.  Left main was normal.  LAD was a long vessel coursing to the apex, it gave off a moderate-sized diagonal Eren [place]. There was a 40% lesion in the proximal LAD, otherwise minimal luminal irregularities.  Left circumflex was a large dominant vessel, gave off a narrow caliber ramus, large OM-1, and narrow caliber OM-2 in several posterolaterals. In the proximal left circumflex, he had a 40% lesion which extended into the proximal portion of the OM-1. There was a 30% lesion in the mid OM- 1. In the OM-2, there was a long 80% stenosis throughout the ostial and proximal portion. This vessel was very small caliber vessel.  Right coronary artery was a nondominant vessel, gave off an RV Zidane Renner. There was approximately 90% stenosis in the proximal portion. Once again, this was a very small nondominant vessel.  Left ventriculogram done in the RAO position showed an EF of 50-55% with inferior  basilar akinesis. There was no significant mitral regurgitation.  Abdominal aortogram showed 40% ostial lesion in the right renal artery. The left renal artery was widely patent. There was no abdominal aortic aneurysm. On panning down over the iliac and femoral system, there was no high-grade lesions.  ASSESSMENT: 1. Stable coronary artery disease with high-grade lesions in the small  obtuse marginal 2 and right coronary artery both of which are too  small for percutaneous intervention. 2. Left ventricular ejection fraction was 50-55% with mildly elevated  filling pressures. 3. Very mild right renal artery stenosis. 4. No obvious high-grade proximal peripheral arterial disease.  PLAN/DISCUSSION: I suspect most of his dyspnea is likely due to his lung disease. I have urged him to stop smoking. We will continue medical therapy now for his heart disease.   08/2011 Echo Study Conclusions  - Left ventricle: The cavity size was normal. There was mild focal basal hypertrophy of the septum. The estimated ejection fraction was 55%. There is akinesis of the basalinferior myocardium. Doppler parameters are consistent with abnormal left ventricular relaxation (grade 1 diastolic dysfunction). - Mitral valve: Trivial regurgitation. - Left atrium: The atrium was at the upper limits of normal in size. - Tricuspid valve: Trivial regurgitation. - Pulmonary arteries: PA peak pressure: 59mm Hg (S). - Pericardium, extracardiac: There was no pericardial effusion.  09/2015 ABIs: 1.2 bilaterally   03/2016 echo Study Conclusions  - Left ventricle: The cavity size was normal. Wall thickness was normal. The estimated ejection fraction was 50%. There is akinesis of the basal-midinferolateral and inferior myocardium. Left ventricular diastolic function parameters were normal. - Aortic valve: Mildly calcified annulus. Trileaflet. - Mitral valve: Mildly thickened  leaflets . There was trivial regurgitation. - Left atrium: The atrium was at the upper limits of normal in size. - Right atrium: Central venous pressure (est): 3 mm Hg. - Atrial septum: No defect or patent foramen ovale was identified. - Tricuspid valve: There was trivial regurgitation. - Pulmonary arteries: PA peak pressure: 9 mm Hg (S). - Pericardium, extracardiac: There was  no pericardial effusion.  Impressions:  - Normal LV wall thickness with LVEF approximately 50%. There is akinesis of the mid to basal inferolateral/inferior wall. Normal diastolic function. Upper normal left atrial chamber size. Mildly thickened mitral leaflets with trivial mitral regurgitation. Mildly sclerotic aortic valve. Trivial tricuspid regurgitation.  08/2017 nuclear stress  There was no ST segment deviation noted during stress.  Findings consistent with large prior inferior/inferolateral myocardial infarction.  This is an intermediate risk study. Risk based on decreased LVEF, there is no current myocardium at jeopardy.  The left ventricular ejection fraction is mildly decreased (45%).      Labs/Other Tests and Data Reviewed:    EKG:  na  Recent Labs: 12/13/2017: ALT 36 12/16/2017: BUN 13; Creatinine, Ser 0.76; Hemoglobin 16.7; Platelets 161; Potassium 4.0; Sodium 143   Recent Lipid Panel Lab Results  Component Value Date/Time   CHOL 138 12/13/2017 04:03 PM   TRIG 304 (H) 12/13/2017 04:03 PM   HDL 34 (L) 12/13/2017 04:03 PM   CHOLHDL 4.1 12/13/2017 04:03 PM   CHOLHDL 5.6 06/20/2014 07:48 AM   LDLCALC 43 12/13/2017 04:03 PM    Wt Readings from Last 3 Encounters:  08/28/18 180 lb 3.2 oz (81.7 kg)  08/15/18 180 lb (81.6 kg)  06/30/18 173 lb 9.6 oz (78.7 kg)     Objective:    Vital Signs:  p 93 bp 135/107 Wt 180 lbs  Normal affect, normal speech pattern and tone. No auditory sounds of SOB or wheezing. Sounds comfortable in no acute distress  ASSESSMENT & PLAN:    1.  CAD - no recent symptoms. Nuclear stress last year without ischemia Mild SOB possibly form weight gain or COPD, continue to monitor at this time.  - continue current medical therapy  2. Hyperlipidemia -LDL at goal, continue staitn. Discusse dietary and exercise changes to improved TGs  3. HTN - home number unclear accuracy, higher DBP than would think given the reported SBP. His bp with pcp last month was at goal, conitnue current meds    COVID-19 Education: The signs and symptoms of COVID-19 were discussed with the patient and how to seek care for testing (follow up with PCP or arrange E-visit).  The importance of social distancing was discussed today.  Time:   Today, I have spent 25  minutes with the patient with telehealth technology discussing the above problems.     Medication Adjustments/Labs and Tests Ordered: Current medicines are reviewed at length with the patient today.  Concerns regarding medicines are outlined above.   Tests Ordered: No orders of the defined types were placed in this encounter.   Medication Changes: No orders of the defined types were placed in this encounter.   Disposition:  Follow up 6 months  Signed, Carlyle Dolly, MD  10/03/2018 7:52 AM    [facility]

## 2018-10-17 ENCOUNTER — Ambulatory Visit (INDEPENDENT_AMBULATORY_CARE_PROVIDER_SITE_OTHER): Payer: Medicare Other | Admitting: Family Medicine

## 2018-10-17 ENCOUNTER — Ambulatory Visit (INDEPENDENT_AMBULATORY_CARE_PROVIDER_SITE_OTHER): Payer: Medicare Other | Admitting: Psychiatry

## 2018-10-17 ENCOUNTER — Other Ambulatory Visit: Payer: Self-pay

## 2018-10-17 ENCOUNTER — Encounter (HOSPITAL_COMMUNITY): Payer: Self-pay | Admitting: Psychiatry

## 2018-10-17 DIAGNOSIS — F418 Other specified anxiety disorders: Secondary | ICD-10-CM | POA: Diagnosis not present

## 2018-10-17 DIAGNOSIS — J438 Other emphysema: Secondary | ICD-10-CM | POA: Diagnosis not present

## 2018-10-17 DIAGNOSIS — Z125 Encounter for screening for malignant neoplasm of prostate: Secondary | ICD-10-CM

## 2018-10-17 DIAGNOSIS — F5105 Insomnia due to other mental disorder: Secondary | ICD-10-CM

## 2018-10-17 DIAGNOSIS — G894 Chronic pain syndrome: Secondary | ICD-10-CM

## 2018-10-17 DIAGNOSIS — I10 Essential (primary) hypertension: Secondary | ICD-10-CM

## 2018-10-17 DIAGNOSIS — E785 Hyperlipidemia, unspecified: Secondary | ICD-10-CM | POA: Diagnosis not present

## 2018-10-17 DIAGNOSIS — F324 Major depressive disorder, single episode, in partial remission: Secondary | ICD-10-CM

## 2018-10-17 MED ORDER — LISINOPRIL 2.5 MG PO TABS: ORAL_TABLET | ORAL | 12 refills | Status: DC

## 2018-10-17 MED ORDER — FLUOXETINE HCL 20 MG PO CAPS: 20.0000 mg | ORAL_CAPSULE | Freq: Every day | ORAL | 2 refills | Status: DC

## 2018-10-17 MED ORDER — ALPRAZOLAM 1 MG PO TABS: 1.0000 mg | ORAL_TABLET | Freq: Four times a day (QID) | ORAL | 2 refills | Status: DC

## 2018-10-17 MED ORDER — OLANZAPINE 10 MG PO TABS: 10.0000 mg | ORAL_TABLET | Freq: Every day | ORAL | 2 refills | Status: DC

## 2018-10-17 MED ORDER — GABAPENTIN 300 MG PO CAPS: 600.0000 mg | ORAL_CAPSULE | Freq: Three times a day (TID) | ORAL | 1 refills | Status: DC

## 2018-10-17 MED ORDER — TEMAZEPAM 30 MG PO CAPS: 30.0000 mg | ORAL_CAPSULE | Freq: Every evening | ORAL | 2 refills | Status: DC | PRN

## 2018-10-17 MED ORDER — METOPROLOL SUCCINATE ER 25 MG PO TB24: 25.0000 mg | ORAL_TABLET | Freq: Every day | ORAL | 1 refills | Status: DC

## 2018-10-17 NOTE — Progress Notes (Signed)
Subjective:  Telephone only video not available  Patient ID: [name], male    DOB: 03/04/1956, 63 y.o.   MRN: [id number]  HPI This patient was seen today for chronic pain  The medication list was reviewed and updated.   -Compliance with medication: hydrocodone 7.5-325 mg   - Number patient states they take daily: 2  -when was the last dose patient took? Last night  The patient was advised the importance of maintaining medication and not using illegal substances with these.  Here for refills and follow up  The patient was educated that we can provide 3 monthly scripts for their medication, it is their responsibility to follow the instructions.  Side effects or complications from medications: none  Patient is aware that pain medications are meant to minimize the severity of the pain to allow their pain levels to improve to allow for better function. They are aware of that pain medications cannot totally remove their pain.  Due for UDT ( at least once per year) :   Virtual Visit via Video Note  I connected with [name] on 10/17/18 at  9:30 AM EDT by a video enabled telemedicine application and verified that I am speaking with the correct person using two identifiers.  Location: Patient: home Provider: office   I discussed the limitations of evaluation and management by telemedicine and the availability of in person appointments. The patient expressed understanding and agreed to proceed.  History of Present Illness:    Observations/Objective:   Assessment and Plan:   Follow Up Instructions:    I discussed the assessment and treatment plan with the patient. The patient was provided an opportunity to ask questions and all were answered. The patient agreed with the plan and demonstrated an understanding of the instructions.   The patient was advised to call back or seek an in-person evaluation if the symptoms worsen or if the condition fails to improve as  anticipated.  I provided 25 minutes of non-face-to-face time during this encounter.   Vicente Males, LPN        Review of Systems     Objective:   Physical Exam        Assessment & Plan:  The patient was seen in followup for chronic pain. A review over at their current pain status was discussed. Drug registry was checked. Prescriptions were given. Discussion was held regarding the importance of compliance with medication as well as pain medication contract.  Time for questions regarding pain management plan occurred. Importance of regular followup visits was discussed. Patient was informed that medication may cause drowsiness and should not be combined  with other medications/alcohol or street drugs. Patient was cautioned that medication could cause drowsiness. If the patient feels medication is causing altered alertness then do not drive or operate dangerous equipment.  Drug registry checked 3 prescriptions were written electronically  Blood pressure good control per patient continue current measures continue medication  Hyperlipidemia previous labs reviewed new labs ordered your do the lab work in the summertime we will follow-up in 3 months  Patient keeps regular follow-ups with his cardiologist as well as with his psychiatrist denies any problems or drowsiness with medications  The patient does have psychiatric health issues for which he sees a psychiatrist for depression and anxiety it is under decent control with medication he will continue the medication.  He still has some bad days but states he is managing through them  Patient does have COPD  he has been encouraged to stay away from smoking continue his inhalers  Patient will continue his pain medication currently tolerating well denies feeling drowsy or drug by it

## 2018-10-17 NOTE — Progress Notes (Signed)
Virtual Visit via Telephone Note  I connected with [name] on 10/17/18 at  8:40 AM EDT by telephone and verified that I am speaking with the correct person using two identifiers.   I discussed the limitations, risks, security and privacy concerns of performing an evaluation and management service by telephone and the availability of in person appointments. I also discussed with the patient that there may be a patient responsible charge related to this service. The patient expressed understanding and agreed to proceed.       I discussed the assessment and treatment plan with the patient. The patient was provided an opportunity to ask questions and all were answered. The patient agreed with the plan and demonstrated an understanding of the instructions.   The patient was advised to call back or seek an in-person evaluation if the symptoms worsen or if the condition fails to improve as anticipated.  I provided15 minutes of non-face-to-face time during this encounter.   [name], MD  [facility] MD/PA/NP OP Progress Note  10/17/2018 9:53 AM [name]  MRN:  [id number]  Chief Complaint:  Chief Complaint    Depression; Anxiety; Follow-up     HPI: This patient is a 63 year old married male who lives with his wife in [place].  He is on disability for coronary artery disease and COPD.  He used to work for [employer].  The patient returns for follow-up after 3 months for treatment of depression and anxiety.  He is assessed via telephone to the coronavirus pandemic.  He states that he is doing okay but having a hard time with the stay at home orders he is used to being able to go out and do things he likes to do but he is staying close to home right now.  His primary doctor is told him to avoid people as much as possible due to his risk factors.  He is taking care of his 2 grandchildren and he enjoys this.  Most the time he sleeps okay.  He states he still has "up-and-down days" and still thinks  a lot about his daughter killed herself in Christmas of 2018.  Fortunately the grandchildren keep him very busy.  He states his mood is under good control as is anxiety and he denies current suicidal ideation. Visit Diagnosis:    ICD-10-CM   1. Depression with anxiety F41.8   2. 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 Anxiety   . Anxiety and depression   . ASCVD (arteriosclerotic cardiovascular disease)    inferior MI with circumflex PTCA in 1993;cath in 2000-50% OM 1&2,30% LAD ;2008-25% LAD, 80% nondominant right ,60% small OM 2,EF of 55% with severe basilar inferior hypokinesis; stress nuclear study in 04/2008 moderate inferior scar with peri-infarction ischemic  . Asthma   . CAD (coronary artery disease)   . Carcinoma in situ of colon 2004   rectal polyp  . Colitis, ischemic ([place]) 2011  . COPD (chronic obstructive pulmonary disease) (HCC)    mild ;excercise induced hypoxemia by cp stress test ;asthma ,bronchitis,  . Depression   . Diverticulosis   . Gastritis 12/30/10   EGD Dr [name]  . GERD (gastroesophageal reflux disease)   . H. pylori infection 2004   treated  . Hemorrhoids   . Hiatal hernia   . Hyperlipidemia   . Hypertension   .  Inflammatory polyps of colon with rectal bleeding ([place])   . Leukocytosis    [name]  . Lung nodule 07/16/2011  . Myocardial infarction [facility])    age 36  . Restless leg syndrome   . Schatzki's ring   . Sleep apnea    does not use CPAP:cannot tolerate, PCP aware  . Syncope   . Tobacco abuse    50 pack years continuing at one halp pack daily  . Tubular adenoma of colon 06/2009   Colonosocpy Dr [name]    Past Surgical History:  Procedure Laterality Date  . BIOPSY  12/19/2017   Procedure: BIOPSY;  Surgeon: [name], MD;  [facility];  Service: Endoscopy;;  gastric  . CARDIAC CATHETERIZATION     . CHOLECYSTECTOMY  2004  . COLONOSCOPY  06/2009   normal terminal ileum, segmental mild inflammation of sigmoid colon (bx unremarkable), polyp, tubular adenoma  . COLONOSCOPY N/A 07/31/2013   Dr.Rourk- redundant anal canal hemorrhoids, colonic diverticulosis, tubular adenoma  . COLONOSCOPY W/ POLYPECTOMY  2004   rectal polyp with carcinoma in situ removed via colonoscopy  . COLONOSCOPY WITH PROPOFOL N/A 09/15/2015   Dr. [name]: Scattered diverticula throughout the colon, 2 sessile polyps found in the descending colon and cecum, 5 mm in size.  Cecal polyp was sessile serrated polyp, descending colon polyp was a tubular adenoma.  He had a abnormal perianal exam along with grade 3 hemorrhoids.  Surveillance exam recommended for 5-year follow-up.  . COLONOSCOPY WITH PROPOFOL N/A 12/19/2017   Procedure: COLONOSCOPY WITH PROPOFOL;  Surgeon: [name], MD;  [facility];  Service: Endoscopy;  Laterality: N/A;  7:30am  . CORONARY ANGIOPLASTY WITH STENT PLACEMENT  01/26/2012   "1; total is now 2"  . ESOPHAGOGASTRODUODENOSCOPY  02/2009   query Barrett's but bx negative  . ESOPHAGOGASTRODUODENOSCOPY  12/30/2010   [name],gastritis, dilated 60F, sm HH, 1 small ulcer, Duodenal erosions, benign bx  . ESOPHAGOGASTRODUODENOSCOPY (EGD) WITH ESOPHAGEAL DILATION N/A 09/12/2012   JAS:[id number] Schatzki's ring s/p Maloney dilator. Small hiatal hernia. negative path  . ESOPHAGOGASTRODUODENOSCOPY (EGD) WITH ESOPHAGEAL DILATION N/A 07/31/2013   Dr. [name]- normal egd, s/p [facility] dilation empirically. Normal small bowel biopsies   . ESOPHAGOGASTRODUODENOSCOPY (EGD) WITH PROPOFOL N/A 09/15/2015   Dr. [name]: Medium sized hiatal hernia, normal-appearing esophagus status post empiric dilation  . ESOPHAGOGASTRODUODENOSCOPY (EGD) WITH PROPOFOL N/A 12/19/2017   Procedure: ESOPHAGOGASTRODUODENOSCOPY (EGD) WITH PROPOFOL;  Surgeon: [name], MD;  [facility];  Service: Endoscopy;  Laterality: N/A;   . FLEXIBLE SIGMOIDOSCOPY  12/30/2010    [name],; internal hemorrhoids, anal papilla  . HAND SURGERY     surgical intervention for injury of the fingers of the left hand many years ago  . heart stent    . HEMORRHOID BANDING     Dr. [name]  . LEFT HEART CATHETERIZATION WITH CORONARY ANGIOGRAM N/A 01/26/2012   Procedure: LEFT HEART CATHETERIZATION WITH CORONARY ANGIOGRAM;  Surgeon: [name], MD;  Location[facility] C[facility] LAB;  Service: Cardiovascular;  Laterality: N/A;  . [name] DILATION N/A 09/15/2015   Procedure: [name] DILATION;  Surgeon: [name], MD;  [facility];  Service: Endoscopy;  Laterality: N/A;  . [name] DILATION N/A 12/19/2017   Procedure: [name] DILATION;  Surgeon: [name], MD;  [facility];  Service: Endoscopy;  Laterality: N/A;  . NASAL SEPTOPLASTY W/ TURBINOPLASTY  10/06/2011   Procedure: NASAL SEPTOPLASTY WITH TURBINATE REDUCTION;  Surgeon: Garret Reddish  Constance Holster, MD;  Location: [facility];  Service: ENT;  Laterality: Bilateral;  . PERCUTANEOUS CORONARY STENT INTERVENTION (PCI-S) N/A 01/26/2012   Procedure: PERCUTANEOUS CORONARY STENT INTERVENTION (PCI-S);  Surgeon: [name], MD;  Location: [facility] CATH LAB;  Service: Cardiovascular;  Laterality: N/A;  . POLYPECTOMY  09/15/2015   Procedure: POLYPECTOMY;  Surgeon: [name], MD;  [facility];  Service: Endoscopy;;  Cecal polyp removed via cold snare/ Descending colon polyp removed via cold snare  . POLYPECTOMY  12/19/2017   Procedure: POLYPECTOMY;  Surgeon: [name], MD;  [facility];  Service: Endoscopy;;  colon    Family Psychiatric History: see below  Family History:  Family History  Problem Relation Age of Onset  . Lung disease Father        deceased, black lung  . Heart disease Mother        blood clots  . Depression Mother   . Cancer Paternal Uncle        unknown type  . Cancer Maternal Aunt        unknown type  . Kidney failure Maternal Uncle   .  Hypertension Brother   . Colon cancer Neg Hx   . ADD / ADHD Neg Hx   . Alcohol abuse Neg Hx   . Drug abuse Neg Hx   . Anxiety disorder Neg Hx   . Bipolar disorder Neg Hx   . Dementia Neg Hx   . OCD Neg Hx   . Paranoid behavior Neg Hx   . Schizophrenia Neg Hx   . Physical abuse Neg Hx   . Sexual abuse Neg Hx   . Seizures Neg Hx     Social History:  Social History   Socioeconomic History  . Marital status: Married    Spouse name: Not on file  . Number of children: 3  . Years of education: Not on file  . Highest education level: Not on file  Occupational History  . Occupation: disable    Employer: RETIRED    Comment: [employer]  Social Needs  . Financial resource strain: Not on file  . Food insecurity:    Worry: Not on file    Inability: Not on file  . Transportation needs:    Medical: Not on file    Non-medical: Not on file  Tobacco Use  . Smoking status: Current Some Day Smoker    Packs/day: 0.50    Years: 30.00    Pack years: 15.00    Types: Cigarettes    Start date: 07/31/1969  . Smokeless tobacco: Former Systems developer  . Tobacco comment: cutting back trying to quit  Substance and Sexual Activity  . Alcohol use: Yes    Comment: Drinks a beer occasionally  . Drug use: No  . Sexual activity: Not Currently  Lifestyle  . Physical activity:    Days per week: Not on file    Minutes per session: Not on file  . Stress: Not on file  Relationships  . Social connections:    Talks on phone: Not on file    Gets together: Not on file    Attends religious service: Not on file    Active member of club or organization: Not on file    Attends meetings of clubs or organizations: Not on file    Relationship status: Not on file  Other Topics Concern  . Not on file  Social History Narrative   3 stepchildren    Allergies: No Known  Allergies  Metabolic Disorder Labs: Lab Results  Component Value Date   HGBA1C 5.3 07/24/2015   MPG 103 10/29/2013   MPG 97 04/30/2013   No results  found for: PROLACTIN Lab Results  Component Value Date   CHOL 138 12/13/2017   TRIG 304 (H) 12/13/2017   HDL 34 (L) 12/13/2017   CHOLHDL 4.1 12/13/2017   VLDL 64 (H) 06/20/2014   LDLCALC 43 12/13/2017   LDLCALC 68 10/01/2016   Lab Results  Component Value Date   TSH 1.011 02/09/2010    Therapeutic Level Labs: No results found for: LITHIUM No results found for: VALPROATE No components found for:  CBMZ  Current Medications: Current Outpatient Medications  Medication Sig Dispense Refill  . albuterol (PROVENTIL) (2.5 MG/3ML) 0.083% nebulizer solution Take 2.5 mg by nebulization every 6 (six) hours as needed for wheezing or shortness of breath.     . ALPRAZolam (XANAX) 1 MG tablet Take 1 tablet (1 mg total) by mouth 4 (four) times daily. 120 tablet 2  . aspirin EC 81 MG tablet Take 81 mg by mouth daily.    Marland Kitchen atorvastatin (LIPITOR) 80 MG tablet TAKE 1 TABLET BY MOUTH EVERY DAY 90 tablet 3  . budesonide-formoterol (SYMBICORT) 80-4.5 MCG/ACT inhaler Inhale 2 puffs into the lungs 2 (two) times daily. 1 Inhaler 12  . FLUoxetine (PROZAC) 20 MG capsule Take 1 capsule (20 mg total) by mouth daily. 90 capsule 2  . FLUoxetine (PROZAC) 40 MG capsule Take 1 capsule (40 mg total) by mouth daily. 90 capsule 2  . fluticasone (FLONASE) 50 MCG/ACT nasal spray Place 2 sprays into both nostrils daily. (Patient taking differently: Place 2 sprays into both nostrils daily as needed for allergies. ) 16 g 5  . gabapentin (NEURONTIN) 300 MG capsule Take 2 capsules (600 mg total) by mouth 3 (three) times daily. 540 capsule 1  . HYDROcodone-acetaminophen (NORCO) 7.5-325 MG tablet Take 1 tablet by mouth 2 (two) times daily as needed for moderate pain. 60 tablet 0  . lisinopril (ZESTRIL) 2.5 MG tablet TAKE 1 TABLET (2.5 MG TOTAL) BY MOUTH DAILY. **STOP VERAPAMIL** 30 tablet 12  . metoprolol succinate (TOPROL-XL) 25 MG 24 hr tablet Take 1 tablet (25 mg total) by mouth daily. 90 tablet 1  . nitroGLYCERIN (NITROSTAT)  0.4 MG SL tablet Place 0.4 mg under the tongue every 5 (five) minutes as needed for chest pain.    Marland Kitchen OLANZapine (ZYPREXA) 10 MG tablet Take 1 tablet (10 mg total) by mouth at bedtime. 90 tablet 2  . temazepam (RESTORIL) 30 MG capsule Take 1 capsule (30 mg total) by mouth at bedtime as needed for sleep. 30 capsule 2  . TRELEGY ELLIPTA 100-62.5-25 MCG/INH AEPB INHALE ONE PUFF ONCE A DAY     No current facility-administered medications for this visit.      Musculoskeletal: Strength & Muscle Tone: within normal limits Gait & Station: normal Patient leans: N/A  Psychiatric Specialty Exam: Review of Systems  Musculoskeletal: Positive for joint pain.  Neurological: Positive for tingling.  All other systems reviewed and are negative.   There were no vitals taken for this visit.There is no height or weight on file to calculate BMI.  General Appearance: NA  Eye Contact:  NA  Speech:  Clear and Coherent  Volume:  Normal  Mood:  Dysphoric  Affect:  NA  Thought Process:  Goal Directed  Orientation:  Full (Time, Place, and Person)  Thought Content: Rumination   Suicidal Thoughts:  No  Homicidal  Thoughts:  No  Memory:  Immediate;   Good Recent;   Good Remote;   Good  Judgement:  Good  Insight:  Fair  Psychomotor Activity:  Decreased  Concentration:  Concentration: Fair and Attention Span: Fair  Recall:  Good  Fund of Knowledge: Fair  Language: Good  Akathisia:  No  Handed:  Right  AIMS (if indicated): not done  Assets:  Communication Skills Desire for Improvement Resilience Social Support Talents/Skills  ADL's:  Intact  Cognition: WNL  Sleep:  Fair   Screenings: GAD-7     Office Visit from 03/30/2018 in [place]  Total GAD-7 Score  4    PHQ2-9     Office Visit from 03/30/2018 in [facility] Office Visit from 05/19/2017 in [facility] Office Visit from 10/21/2015 in [place] Office Visit from 04/23/2015 in  [facility]  PHQ-2 Total Score  2  0  0  0  PHQ-9 Total Score  12  -  -  -       Assessment and Plan: This patient is a 63 year old male with a history of depression anxiety and possible posttraumatic stress disorder relating to his daughter suicide.  He is doing fairly well on his current regimen.  He will continue Xanax 1 mg 4 times daily for anxiety, olanzapine 10 mg at bedtime for mood stabilization, gabapentin 600 mg 3 times daily for anxiety, Prozac 60 mg daily for depression and temazepam 30 mg at bedtime for sleep.  He will return to see me in 3 months   [name], MD 10/17/2018, 9:53 AM

## 2018-10-21 MED ORDER — HYDROCODONE-ACETAMINOPHEN 7.5-325 MG PO TABS: 1.0000 | ORAL_TABLET | Freq: Two times a day (BID) | ORAL | 0 refills | Status: DC | PRN

## 2018-10-21 MED ORDER — HYDROCODONE-ACETAMINOPHEN 7.5-325 MG PO TABS: ORAL_TABLET | ORAL | 0 refills | Status: DC

## 2018-11-20 ENCOUNTER — Other Ambulatory Visit: Payer: Self-pay

## 2018-11-20 MED ORDER — METOPROLOL SUCCINATE ER 50 MG PO TB24: 50.0000 mg | ORAL_TABLET | Freq: Every day | ORAL | 3 refills | Status: DC

## 2019-01-17 ENCOUNTER — Other Ambulatory Visit: Payer: Self-pay

## 2019-01-17 ENCOUNTER — Ambulatory Visit (HOSPITAL_COMMUNITY): Payer: Medicare Other | Admitting: Psychiatry

## 2019-01-26 ENCOUNTER — Other Ambulatory Visit: Payer: Self-pay

## 2019-01-26 ENCOUNTER — Ambulatory Visit (INDEPENDENT_AMBULATORY_CARE_PROVIDER_SITE_OTHER): Payer: Medicare Other | Admitting: Psychiatry

## 2019-01-26 ENCOUNTER — Encounter (HOSPITAL_COMMUNITY): Payer: Self-pay | Admitting: Psychiatry

## 2019-01-26 DIAGNOSIS — F5105 Insomnia due to other mental disorder: Secondary | ICD-10-CM | POA: Diagnosis not present

## 2019-01-26 DIAGNOSIS — F418 Other specified anxiety disorders: Secondary | ICD-10-CM | POA: Diagnosis not present

## 2019-01-26 MED ORDER — TEMAZEPAM 30 MG PO CAPS: 30.0000 mg | ORAL_CAPSULE | Freq: Every evening | ORAL | 2 refills | Status: DC | PRN

## 2019-01-26 MED ORDER — FLUOXETINE HCL 40 MG PO CAPS: 40.0000 mg | ORAL_CAPSULE | Freq: Every day | ORAL | 2 refills | Status: DC

## 2019-01-26 MED ORDER — GABAPENTIN 300 MG PO CAPS: 600.0000 mg | ORAL_CAPSULE | Freq: Three times a day (TID) | ORAL | 1 refills | Status: DC

## 2019-01-26 MED ORDER — OLANZAPINE 10 MG PO TABS: 10.0000 mg | ORAL_TABLET | Freq: Every day | ORAL | 2 refills | Status: DC

## 2019-01-26 MED ORDER — FLUOXETINE HCL 20 MG PO CAPS: 20.0000 mg | ORAL_CAPSULE | Freq: Every day | ORAL | 2 refills | Status: DC

## 2019-01-26 MED ORDER — ALPRAZOLAM 1 MG PO TABS: 1.0000 mg | ORAL_TABLET | Freq: Four times a day (QID) | ORAL | 2 refills | Status: DC

## 2019-01-26 NOTE — Progress Notes (Signed)
Virtual Visit via Telephone Note  I connected with [name] on 01/26/19 at 10:00 AM EDT by telephone and verified that I am speaking with the correct person using two identifiers.   I discussed the limitations, risks, security and privacy concerns of performing an evaluation and management service by telephone and the availability of in person appointments. I also discussed with the patient that there may be a patient responsible charge related to this service. The patient expressed understanding and agreed to proceed.       I discussed the assessment and treatment plan with the patient. The patient was provided an opportunity to ask questions and all were answered. The patient agreed with the plan and demonstrated an understanding of the instructions.   The patient was advised to call back or seek an in-person evaluation if the symptoms worsen or if the condition fails to improve as anticipated.  I provided 15 minutes of non-face-to-face time during this encounter.   [name], MD  [facility] MD/PA/NP OP Progress Note  01/26/2019 10:08 AM [name]  MRN:  [id number]  Chief Complaint:  Chief Complaint    Depression; Anxiety; Follow-up     HPI: This patient is a 63 year old married male who lives with his wife in [place].  He is on disability for coronary artery disease and COPD.  He used to work for [employer].  The patient returns for follow-up after 3 months for treatment of depression and anxiety as well as posttraumatic stress disorder.  He is assessed via phone due to the coronavirus pandemic.  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  He likes having something to do.  It keeps his mind off his thoughts.  He still tends to do well on the daughter who killed herself on the Christmas of 2018.  He and his wife have custody of this daughter's 70-year-old boy and take care of her 62-year-old girl good deal of the time.  So far  they are doing well taking care of the children.  He states that the medication still helps with his depression and anxiety.  His sleep is variable but the medicine helps.  At times however he wakes up and cannot get back to sleep.  Overall however he thinks he is functioning a little bit better.  He is taking precautions to not be around people because of the coronavirus.  He denies any thoughts of suicidality or self-harm. Visit Diagnosis:    ICD-10-CM   1. Depression with anxiety  F41.8   2. 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 Anxiety   . Anxiety and depression   . ASCVD (arteriosclerotic cardiovascular disease)    inferior MI with circumflex PTCA in 1993;cath in 2000-50% OM 1&2,30% LAD ;2008-25% LAD, 80% nondominant right ,60% small OM 2,EF of 55% with severe basilar inferior hypokinesis; stress nuclear study in 04/2008 moderate inferior scar with peri-infarction ischemic  . Asthma   . CAD (coronary artery disease)   . Carcinoma in situ of colon 2004   rectal polyp  . Colitis, ischemic ([place]) 2011  . COPD (chronic obstructive pulmonary disease) (HCC)    mild ;excercise induced hypoxemia by cp stress test ;asthma ,bronchitis,  . Depression   . Diverticulosis   . Gastritis 12/30/10   EGD  Dr [name]  . GERD (gastroesophageal reflux disease)   . H. pylori infection 2004   treated  . Hemorrhoids   . Hiatal hernia   . Hyperlipidemia   . Hypertension   . Inflammatory polyps of colon with rectal bleeding ([place])   . Leukocytosis    [name]  . Lung nodule 07/16/2011  . Myocardial infarction [facility])    age 68  . Restless leg syndrome   . Schatzki's ring   . Sleep apnea    does not use CPAP:cannot tolerate, PCP aware  . Syncope   . Tobacco abuse    50 pack years continuing at one halp pack daily  . Tubular adenoma of colon 06/2009   Colonosocpy Dr  [name]    Past Surgical History:  Procedure Laterality Date  . BIOPSY  12/19/2017   Procedure: BIOPSY;  Surgeon: [name], MD;  [facility];  Service: Endoscopy;;  gastric  . CARDIAC CATHETERIZATION    . CHOLECYSTECTOMY  2004  . COLONOSCOPY  06/2009   normal terminal ileum, segmental mild inflammation of sigmoid colon (bx unremarkable), polyp, tubular adenoma  . COLONOSCOPY N/A 07/31/2013   Dr.Rourk- redundant anal canal hemorrhoids, colonic diverticulosis, tubular adenoma  . COLONOSCOPY W/ POLYPECTOMY  2004   rectal polyp with carcinoma in situ removed via colonoscopy  . COLONOSCOPY WITH PROPOFOL N/A 09/15/2015   Dr. [name]: Scattered diverticula throughout the colon, 2 sessile polyps found in the descending colon and cecum, 5 mm in size.  Cecal polyp was sessile serrated polyp, descending colon polyp was a tubular adenoma.  He had a abnormal perianal exam along with grade 3 hemorrhoids.  Surveillance exam recommended for 5-year follow-up.  . COLONOSCOPY WITH PROPOFOL N/A 12/19/2017   Procedure: COLONOSCOPY WITH PROPOFOL;  Surgeon: [name], MD;  [facility];  Service: Endoscopy;  Laterality: N/A;  7:30am  . CORONARY ANGIOPLASTY WITH STENT PLACEMENT  01/26/2012   "1; total is now 2"  . ESOPHAGOGASTRODUODENOSCOPY  02/2009   query Barrett's but bx negative  . ESOPHAGOGASTRODUODENOSCOPY  12/30/2010   [name],gastritis, dilated 78F, sm HH, 1 small ulcer, Duodenal erosions, benign bx  . ESOPHAGOGASTRODUODENOSCOPY (EGD) WITH ESOPHAGEAL DILATION N/A 09/12/2012   FUX:[id number] Schatzki's ring s/p Maloney dilator. Small hiatal hernia. negative path  . ESOPHAGOGASTRODUODENOSCOPY (EGD) WITH ESOPHAGEAL DILATION N/A 07/31/2013   Dr. [name]- normal egd, s/p [facility] dilation empirically. Normal small bowel biopsies   . ESOPHAGOGASTRODUODENOSCOPY (EGD) WITH PROPOFOL N/A 09/15/2015   Dr. [name]: Medium sized hiatal hernia, normal-appearing esophagus status post empiric  dilation  . ESOPHAGOGASTRODUODENOSCOPY (EGD) WITH PROPOFOL N/A 12/19/2017   Procedure: ESOPHAGOGASTRODUODENOSCOPY (EGD) WITH PROPOFOL;  Surgeon: [name], MD;  [facility];  Service: Endoscopy;  Laterality: N/A;  . FLEXIBLE SIGMOIDOSCOPY  12/30/2010    [name],; internal hemorrhoids, anal papilla  . HAND SURGERY     surgical intervention for injury of the fingers of the left hand many years ago  . heart stent    . HEMORRHOID BANDING     Dr. [name]  . LEFT HEART CATHETERIZATION WITH CORONARY ANGIOGRAM N/A 01/26/2012   Procedure: LEFT HEART CATHETERIZATION WITH CORONARY ANGIOGRAM;  Surgeon: [name], MD;  Location: [facility] CATH LAB;  Service: Cardiovascular;  Laterality: N/A;  . [name] DILATION N/A 09/15/2015   Procedure: [name] DILATION;  Surgeon: [name], MD;  [facility];  Service: Endoscopy;  Laterality: N/A;  . MALONEY DILATION N/A 12/19/2017   Procedure: [name]  DILATION;  Surgeon: [name], MD;  [facility];  Service: Endoscopy;  Laterality: N/A;  . NASAL SEPTOPLASTY W/ TURBINOPLASTY  10/06/2011   Procedure: NASAL SEPTOPLASTY WITH TURBINATE REDUCTION;  Surgeon: Izora Gala, MD;  Location: [place];  Service: ENT;  Laterality: Bilateral;  . PERCUTANEOUS CORONARY STENT INTERVENTION (PCI-S) N/A 01/26/2012   Procedure: PERCUTANEOUS CORONARY STENT INTERVENTION (PCI-S);  Surgeon: [name], MD;  Location: [facility] CATH LAB;  Service: Cardiovascular;  Laterality: N/A;  . POLYPECTOMY  09/15/2015   Procedure: POLYPECTOMY;  Surgeon: [name], MD;  [facility];  Service: Endoscopy;;  Cecal polyp removed via cold snare/ Descending colon polyp removed via cold snare  . POLYPECTOMY  12/19/2017   Procedure: POLYPECTOMY;  Surgeon: [name], MD;  [facility];  Service: Endoscopy;;  colon    Family Psychiatric History: see below  Family History:  Family History  Problem Relation Age of Onset  . Lung disease Father         deceased, black lung  . Heart disease Mother        blood clots  . Depression Mother   . Cancer Paternal Uncle        unknown type  . Cancer Maternal Aunt        unknown type  . Kidney failure Maternal Uncle   . Hypertension Brother   . Colon cancer Neg Hx   . ADD / ADHD Neg Hx   . Alcohol abuse Neg Hx   . Drug abuse Neg Hx   . Anxiety disorder Neg Hx   . Bipolar disorder Neg Hx   . Dementia Neg Hx   . OCD Neg Hx   . Paranoid behavior Neg Hx   . Schizophrenia Neg Hx   . Physical abuse Neg Hx   . Sexual abuse Neg Hx   . Seizures Neg Hx     Social History:  Social History   Socioeconomic History  . Marital status: Married    Spouse name: Not on file  . Number of children: 3  . Years of education: Not on file  . Highest education level: Not on file  Occupational History  . Occupation: disable    Employer: RETIRED    Comment: [employer]  Social Needs  . Financial resource strain: Not on file  . Food insecurity    Worry: Not on file    Inability: Not on file  . Transportation needs    Medical: Not on file    Non-medical: Not on file  Tobacco Use  . Smoking status: Current Some Day Smoker    Packs/day: 0.50    Years: 30.00    Pack years: 15.00    Types: Cigarettes    Start date: 07/31/1969  . Smokeless tobacco: Former Systems developer  . Tobacco comment: cutting back trying to quit  Substance and Sexual Activity  . Alcohol use: Yes    Comment: Drinks a beer occasionally  . Drug use: No  . Sexual activity: Not Currently  Lifestyle  . Physical activity    Days per week: Not on file    Minutes per session: Not on file  . Stress: Not on file  Relationships  . Social Herbalist on phone: Not on file    Gets together: Not on file    Attends religious service: Not on file    Active member of club or organization: Not on file    Attends meetings of  clubs or organizations: Not on file    Relationship status: Not on file  Other Topics Concern  . Not on file   Social History Narrative   3 stepchildren    Allergies: No Known Allergies  Metabolic Disorder Labs: Lab Results  Component Value Date   HGBA1C 5.3 07/24/2015   MPG 103 10/29/2013   MPG 97 04/30/2013   No results found for: PROLACTIN Lab Results  Component Value Date   CHOL 138 12/13/2017   TRIG 304 (H) 12/13/2017   HDL 34 (L) 12/13/2017   CHOLHDL 4.1 12/13/2017   VLDL 64 (H) 06/20/2014   LDLCALC 43 12/13/2017   LDLCALC 68 10/01/2016   Lab Results  Component Value Date   TSH 1.011 02/09/2010    Therapeutic Level Labs: No results found for: LITHIUM No results found for: VALPROATE No components found for:  CBMZ  Current Medications: Current Outpatient Medications  Medication Sig Dispense Refill  . albuterol (PROVENTIL) (2.5 MG/3ML) 0.083% nebulizer solution Take 2.5 mg by nebulization every 6 (six) hours as needed for wheezing or shortness of breath.     . ALPRAZolam (XANAX) 1 MG tablet Take 1 tablet (1 mg total) by mouth 4 (four) times daily. 120 tablet 2  . aspirin EC 81 MG tablet Take 81 mg by mouth daily.    Marland Kitchen atorvastatin (LIPITOR) 80 MG tablet TAKE 1 TABLET BY MOUTH EVERY DAY 90 tablet 3  . budesonide-formoterol (SYMBICORT) 80-4.5 MCG/ACT inhaler Inhale 2 puffs into the lungs 2 (two) times daily. 1 Inhaler 12  . FLUoxetine (PROZAC) 20 MG capsule Take 1 capsule (20 mg total) by mouth daily. 90 capsule 2  . FLUoxetine (PROZAC) 40 MG capsule Take 1 capsule (40 mg total) by mouth daily. 90 capsule 2  . fluticasone (FLONASE) 50 MCG/ACT nasal spray Place 2 sprays into both nostrils daily. (Patient taking differently: Place 2 sprays into both nostrils daily as needed for allergies. ) 16 g 5  . gabapentin (NEURONTIN) 300 MG capsule Take 2 capsules (600 mg total) by mouth 3 (three) times daily. 540 capsule 1  . HYDROcodone-acetaminophen (NORCO) 7.5-325 MG tablet Take 1 tablet by mouth 2 (two) times daily as needed for moderate pain. 60 tablet 0  . HYDROcodone-acetaminophen  (NORCO) 7.5-325 MG tablet Take 1 tablet by mouth 2 (two) times daily prn moderate pain 60 tablet 0  . HYDROcodone-acetaminophen (NORCO) 7.5-325 MG tablet Take 1 tablet by mouth 2 (two) times daily prn moderate pain 60 tablet 0  . lisinopril (ZESTRIL) 2.5 MG tablet TAKE 1 TABLET (2.5 MG TOTAL) BY MOUTH DAILY. **STOP VERAPAMIL** 30 tablet 12  . metoprolol succinate (TOPROL-XL) 50 MG 24 hr tablet Take 1 tablet (50 mg total) by mouth daily. 90 tablet 3  . nitroGLYCERIN (NITROSTAT) 0.4 MG SL tablet Place 0.4 mg under the tongue every 5 (five) minutes as needed for chest pain.    Marland Kitchen OLANZapine (ZYPREXA) 10 MG tablet Take 1 tablet (10 mg total) by mouth at bedtime. 90 tablet 2  . temazepam (RESTORIL) 30 MG capsule Take 1 capsule (30 mg total) by mouth at bedtime as needed for sleep. 30 capsule 2  . TRELEGY ELLIPTA 100-62.5-25 MCG/INH AEPB INHALE ONE PUFF ONCE A DAY     No current facility-administered medications for this visit.      Musculoskeletal: Strength & Muscle Tone: within normal limits Gait & Station: normal Patient leans: N/A  Psychiatric Specialty Exam: Review of Systems  Musculoskeletal: Positive for back pain.  Psychiatric/Behavioral: Positive for depression.  The patient has insomnia.   All other systems reviewed and are negative.   There were no vitals taken for this visit.There is no height or weight on file to calculate BMI.  General Appearance: NA  Eye Contact:  NA  Speech:  Clear and Coherent  Volume:  Normal  Mood:  Dysphoric  Affect:  NA  Thought Process:  Goal Directed  Orientation:  Full (Time, Place, and Person)  Thought Content: Rumination   Suicidal Thoughts:  No  Homicidal Thoughts:  No  Memory:  Immediate;   Good Recent;   Good Remote;   Fair  Judgement:  Good  Insight:  Fair  Psychomotor Activity:  Normal  Concentration:  Concentration: Fair and Attention Span: Fair  Recall:  Good  Fund of Knowledge: Fair  Language: Good  Akathisia:  No  Handed:   Right  AIMS (if indicated): not done  Assets:  Communication Skills Desire for Improvement Resilience Social Support Talents/Skills  ADL's:  Intact  Cognition: WNL  Sleep:  Fair   Screenings: GAD-7     Office Visit from 03/30/2018 in [place]  Total GAD-7 Score  4    PHQ2-9     Office Visit from 03/30/2018 in [place] Office Visit from 05/19/2017 in [place] Office Visit from 10/21/2015 in [place] Office Visit from 04/23/2015 in [place]  PHQ-2 Total Score  2  0  0  0  PHQ-9 Total Score  12  -  -  -       Assessment and Plan: This patient is a 63 year old male with a history of depression and anxiety and possible posttraumatic stress disorder relating to his daughter suicide.  For the most part he does well on his current regimen.  He is grateful to have something to do, helping out his friend.  He will continue Xanax 1 mg 4 times daily for anxiety, Zyprexa 10 mg at bedtime for mood stabilization, gabapentin 600 mg 3 times daily for anxiety, Prozac 60 mg daily for depression and temazepam 30 mg at bedtime for sleep.  He will return to see me in 3 months   [name], MD 01/26/2019, 10:08 AM

## 2019-02-04 ENCOUNTER — Other Ambulatory Visit: Payer: Self-pay | Admitting: Family Medicine

## 2019-02-08 ENCOUNTER — Telehealth: Payer: Self-pay | Admitting: Gastroenterology

## 2019-02-08 ENCOUNTER — Telehealth: Payer: Self-pay | Admitting: Internal Medicine

## 2019-02-08 ENCOUNTER — Encounter: Payer: Self-pay | Admitting: Gastroenterology

## 2019-02-08 ENCOUNTER — Ambulatory Visit: Payer: Medicare Other | Admitting: Gastroenterology

## 2019-02-08 ENCOUNTER — Encounter: Payer: Self-pay | Admitting: Internal Medicine

## 2019-02-08 ENCOUNTER — Other Ambulatory Visit: Payer: Self-pay

## 2019-02-08 VITALS — BP 145/88 | HR 81 | Temp 96.9°F | Ht 69.0 in | Wt 168.0 lb

## 2019-02-08 DIAGNOSIS — K649 Unspecified hemorrhoids: Secondary | ICD-10-CM | POA: Diagnosis not present

## 2019-02-08 DIAGNOSIS — K921 Melena: Secondary | ICD-10-CM

## 2019-02-08 DIAGNOSIS — R197 Diarrhea, unspecified: Secondary | ICD-10-CM | POA: Diagnosis not present

## 2019-02-08 DIAGNOSIS — K625 Hemorrhage of anus and rectum: Secondary | ICD-10-CM | POA: Diagnosis not present

## 2019-02-08 LAB — CBC
HCT: 46.2 % (ref 38.5–50.0)
Hemoglobin: 15.9 g/dL (ref 13.2–17.1)
MCH: 32.1 pg (ref 27.0–33.0)
MCHC: 34.4 g/dL (ref 32.0–36.0)
MCV: 93.1 fL (ref 80.0–100.0)
MPV: 10.2 fL (ref 7.5–12.5)
Platelets: 224 10*3/uL (ref 140–400)
RBC: 4.96 10*6/uL (ref 4.20–5.80)
RDW: 13 % (ref 11.0–15.0)
WBC: 13.9 10*3/uL — ABNORMAL HIGH (ref 3.8–10.8)

## 2019-02-08 MED ORDER — HYDROCORTISONE (PERIANAL) 2.5 % EX CREA: 1.0000 "application " | TOPICAL_CREAM | Freq: Two times a day (BID) | CUTANEOUS | 1 refills | Status: DC

## 2019-02-08 NOTE — Assessment & Plan Note (Addendum)
63 year old male presenting with chronic history of intermittent rectal bleeding that is worsening. Brbpr 1-2 days a week in the stool, toilet water, and on toilet tissue.  Associated with occasional rectal pain and burning after bowel movements that are with bleeding. Also with chronic diarrhea and associated abdominal cramping. He has known hemorrhoids and has undergone banding x3 in 2015; however, last colonoscopy in July 2019 noted medium sized grade 2 internal hemorrhoids. Also with 2 polyps removed. Pathology revealed tubular adenoma.  Suspected rectal bleeding was anorectal in origin possibly due to hemorrhoids. Repeat TCS in 2024.  Preparation H not helping at this time.  Rectal exam today with external skin tags, internal hemorrhoids, some burning noted by the patient during exam but no sharp pain.  No blood on exam.  Suspect ongoing rectal bleeding is secondary to hemorrhoids. Diarrhea may be irritating hemorrhoid tissue and contributing to bleeding. Cannot entirely rule out fissure with rectal pain although no fissure appreciated on exam today.  Will prescribe Edie [facility]othe[facility] cream compounded with 0.125% nitroglycerin and lidocaine for hemorrhoids. This will also treat for possible anal fissure. We will also update CBC as this has not been checked over 1 year. Plan for hemorrhoid banding in the next 4 to 6 weeks with Roseanne Kaufman, NP.

## 2019-02-08 NOTE — Telephone Encounter (Signed)
Scheduled patient for a banding in [facility] available and sent him a letter

## 2019-02-08 NOTE — Progress Notes (Signed)
Referring Provider: [name], MD Primary Care Physician:  [name], MD Primary GI Physician: Dr. [name]  Chief Complaint  Patient presents with   Hemorrhoids    pain, bleeding; Prep H not helping    HPI:   [name] is a 63 y.o. male presenting today due to worsening rectal bleeding. He was last seen in office on 10/18/17 for worsening rectal bleeding, diarrhea, abdominal cramping, and dysphagia. Has had hemorrhoid banding x 3 in 2015 with good results. Prior workup for diarrhea has included small bowel biopsies negative for celiac disease and negative random colon biopsies remotely. Plans at last visit was EGD +/- dilation and TCS for further evaluation.   EGD on 12/19/2017 with mild erosive esophagitis dilated, erosive gastropathy biopsied, normal duodenal bulb and second portion of the duodenum.  Pathology with slight chronic inflammation.  Colonoscopy on 12/19/17 with one 5 mm polyp in the ascending colon, removed with a cold snare and 1 cm sessile appearing polyp in the ascending removed with a hot snare. Non-bleeding internal hemorrhoids. Suspect anorectal origin of bleeding?hemorrhoids. Pathology with tubular adenoma. Repeat in 2024.   Today he states he has off and on rectal pain, hematochezia, and rectal burning. These symptoms have been present since he saw [place] in 2019, but feels like the bleeding is getting worse. Brbpr about 1-2 days a week. Blood in toilet water, on toilet tissue, and in stool. States, "It is a pretty good amount of blood." Known hemorrhoids. No prolapsing tissue. Using preparation H, but this is not helping.  Rectal burning and pain are intermittent and usually after a bowel movement that is with rectal bleeding. Pain is sometimes sharp. No constipation. BMs always loose, more watery. Has been present for 1-2 years. Doesn't take anything for this. About 3-4 BMs a day. Usually postprandial. Will have some cramping in lower abdomen before BM. Some  nocturnal stools. Reports daughter committing suicide in December 2018. He helps take care of her children. Has one child full time. Thinks this is about the time the diarrhea started. He reports his stools were "normal" prior to this. No antibiotics or hospitalization. Drinks well water. Has filter system at his well.    No upper GI symptoms. Denies fever, chills, lightheadedness, dizziness, or feeling like he will pass out. No unintentional weight loss.   No NSAIDs other than aspirin.   Past Medical History:  Diagnosis Date   Anxiety    Anxiety and depression    ASCVD (arteriosclerotic cardiovascular disease)    inferior MI with circumflex PTCA in 1993;cath in 2000-50% OM 1&2,30% LAD ;2008-25% LAD, 80% nondominant right ,60% small OM 2,EF of 55% with severe basilar inferior hypokinesis; stress nuclear study in 04/2008 moderate inferior scar with peri-infarction ischemic   Asthma    CAD (coronary artery disease)    Carcinoma in situ of colon 2004   rectal polyp   Colitis, ischemic ([place]) 2011   COPD (chronic obstructive pulmonary disease) (HCC)    mild ;excercise induced hypoxemia by cp stress test ;asthma ,bronchitis,   Depression    Diverticulosis    Gastritis 12/30/10   EGD Dr [name]   GERD (gastroesophageal reflux disease)    H. 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)    Leukocytosis    [name]   Lung nodule 07/16/2011  Myocardial infarction [facility])    age 24   Restless leg syndrome    Schatzki's ring    Sleep apnea    does not use CPAP:cannot tolerate, PCP aware   Syncope    Tobacco abuse    50 pack years continuing at one halp pack daily   Tubular adenoma of colon 06/2009   Colonosocpy Dr [name]    Past Surgical History:  Procedure Laterality Date   BIOPSY  12/19/2017   Procedure: BIOPSY;  Surgeon: [name], MD;   [facility];  Service: Endoscopy;;  gastric   CARDIAC CATHETERIZATION     CHOLECYSTECTOMY  2004   COLONOSCOPY  06/2009   normal terminal ileum, segmental mild inflammation of sigmoid colon (bx unremarkable), polyp, tubular adenoma   COLONOSCOPY N/A 07/31/2013   Dr.Rourk- redundant anal canal hemorrhoids, colonic diverticulosis, tubular adenoma   COLONOSCOPY W/ POLYPECTOMY  2004   rectal polyp with carcinoma in situ removed via colonoscopy   COLONOSCOPY WITH PROPOFOL N/A 09/15/2015   Dr. [name]: Scattered diverticula throughout the colon, 2 sessile polyps found in the descending colon and cecum, 5 mm in size.  Cecal polyp was sessile serrated polyp, descending colon polyp was a tubular adenoma.  He had a abnormal perianal exam along with grade 3 hemorrhoids.  Surveillance exam recommended for 5-year follow-up.   COLONOSCOPY WITH PROPOFOL N/A 12/19/2017   Procedure: COLONOSCOPY WITH PROPOFOL;  Surgeon: [name], MD;  [facility];  Service: Endoscopy;  Laterality: N/A;  7:30am   CORONARY ANGIOPLASTY WITH STENT PLACEMENT  01/26/2012   "1; total is now 2"   ESOPHAGOGASTRODUODENOSCOPY  02/2009   query Barrett's but bx negative   ESOPHAGOGASTRODUODENOSCOPY  12/30/2010   [name],gastritis, dilated 11F, sm HH, 1 small ulcer, Duodenal erosions, benign bx   ESOPHAGOGASTRODUODENOSCOPY (EGD) WITH ESOPHAGEAL DILATION N/A 09/12/2012   [id number] Schatzki's ring s/p Maloney dilator. Small hiatal hernia. negative path   ESOPHAGOGASTRODUODENOSCOPY (EGD) WITH ESOPHAGEAL DILATION N/A 07/31/2013   Dr. [name]- normal egd, s/p [facility] dilation empirically. Normal small bowel biopsies    ESOPHAGOGASTRODUODENOSCOPY (EGD) WITH PROPOFOL N/A 09/15/2015   Dr. [name]: Medium sized hiatal hernia, normal-appearing esophagus status post empiric dilation   ESOPHAGOGASTRODUODENOSCOPY (EGD) WITH PROPOFOL N/A 12/19/2017   Procedure: ESOPHAGOGASTRODUODENOSCOPY (EGD) WITH PROPOFOL;   Surgeon: [name], MD;  [facility];  Service: Endoscopy;  Laterality: N/A;   FLEXIBLE SIGMOIDOSCOPY  12/30/2010    [name],; internal hemorrhoids, anal papilla   HAND SURGERY     surgical intervention for injury of the fingers of the left hand many years ago   heart stent     HEMORRHOID BANDING     Dr. [name]   LEFT HEART CATHETERIZATION WITH CORONARY ANGIOGRAM N/A 01/26/2012   Procedure: LEFT HEART CATHETERIZATION WITH CORONARY ANGIOGRAM;  Surgeon: [name], MD;  Location[facility] C[facility] LAB;  Service: Cardiovascular;  Laterality: N/A;   MALONEY DILATION N/A 09/15/2015   Procedure: [name] DILATION;  Surgeon: [name], MD;  [facility];  Service: Endoscopy;  Laterality: N/A;   MALONEY DILATION N/A 12/19/2017   Procedure: [name] DILATION;  Surgeon: [name], MD;  [facility];  Service: Endoscopy;  Laterality: N/A;   NASAL SEPTOPLASTY W/ TURBINOPLASTY  10/06/2011   Procedure: NASAL SEPTOPLASTY WITH TURBINATE REDUCTION;  Surgeon: Izora Gala, MD;  Location: [place];  Service: ENT;  Laterality: Bilateral;   PERCUTANEOUS CORONARY STENT INTERVENTION (PCI-S) N/A 01/26/2012   Procedure:  PERCUTANEOUS CORONARY STENT INTERVENTION (PCI-S);  Surgeon: [name], MD;  Location: [facility] CATH LAB;  Service: Cardiovascular;  Laterality: N/A;   POLYPECTOMY  09/15/2015   Procedure: POLYPECTOMY;  Surgeon: [name], MD;  [facility];  Service: Endoscopy;;  Cecal polyp removed via cold snare/ Descending colon polyp removed via cold snare   POLYPECTOMY  12/19/2017   Procedure: POLYPECTOMY;  Surgeon: [name], MD;  [facility];  Service: Endoscopy;;  colon    Current Outpatient Medications  Medication Sig Dispense Refill   albuterol (PROVENTIL) (2.5 MG/3ML) 0.083% nebulizer solution Take 2.5 mg by nebulization every 6 (six) hours as needed for wheezing or shortness of breath.      ALPRAZolam (XANAX) 1 MG tablet Take  1 tablet (1 mg total) by mouth 4 (four) times daily. 120 tablet 2   aspirin EC 81 MG tablet Take 81 mg by mouth daily.     atorvastatin (LIPITOR) 80 MG tablet TAKE 1 TABLET BY MOUTH EVERY DAY 90 tablet 3   budesonide-formoterol (SYMBICORT) 80-4.5 MCG/ACT inhaler Inhale 2 puffs into the lungs 2 (two) times daily. 1 Inhaler 12   FLUoxetine (PROZAC) 20 MG capsule Take 1 capsule (20 mg total) by mouth daily. 90 capsule 2   FLUoxetine (PROZAC) 40 MG capsule Take 1 capsule (40 mg total) by mouth daily. 90 capsule 2   fluticasone (FLONASE) 50 MCG/ACT nasal spray Place 2 sprays into both nostrils daily. (Patient taking differently: Place 2 sprays into both nostrils daily as needed for allergies. ) 16 g 5   gabapentin (NEURONTIN) 300 MG capsule Take 2 capsules (600 mg total) by mouth 3 (three) times daily. 540 capsule 1   HYDROcodone-acetaminophen (NORCO) 7.5-325 MG tablet Take 1 tablet by mouth 2 (two) times daily as needed for moderate pain. 60 tablet 0   hydrocortisone (ANUSOL-HC) 25 MG suppository Place 25 mg rectally 2 (two) times daily.     lisinopril (ZESTRIL) 2.5 MG tablet TAKE 1 TABLET (2.5 MG TOTAL) BY MOUTH DAILY. **STOP VERAPAMIL** 30 tablet 12   metoprolol succinate (TOPROL-XL) 50 MG 24 hr tablet Take 1 tablet (50 mg total) by mouth daily. 90 tablet 3   nitroGLYCERIN (NITROSTAT) 0.4 MG SL tablet Place 0.4 mg under the tongue every 5 (five) minutes as needed for chest pain.     OLANZapine (ZYPREXA) 10 MG tablet Take 1 tablet (10 mg total) by mouth at bedtime. 90 tablet 2   temazepam (RESTORIL) 30 MG capsule Take 1 capsule (30 mg total) by mouth at bedtime as needed for sleep. 30 capsule 2   TRELEGY ELLIPTA 100-62.5-25 MCG/INH AEPB INHALE ONE PUFF ONCE A DAY     hydrocortisone (ANUSOL-HC) 2.5 % rectal cream Place 1 application rectally 2 (two) times daily. [facility] cream compounded with 0.125% nitroglycerin and lidocaine 30 g 1   No current facility-administered  medications for this visit.     Allergies as of 02/08/2019   (No Known Allergies)    Family History  Problem Relation Age of Onset   Lung disease Father        deceased, black lung   Heart disease Mother        blood clots   Depression Mother    Cancer Paternal Uncle        unknown type   Cancer Maternal Aunt        unknown type   Kidney failure Maternal Uncle    Hypertension Brother    Colon cancer Neg  Hx    ADD / ADHD Neg Hx    Alcohol abuse Neg Hx    Drug abuse Neg Hx    Anxiety disorder Neg Hx    Bipolar disorder Neg Hx    Dementia Neg Hx    OCD Neg Hx    Paranoid behavior Neg Hx    Schizophrenia Neg Hx    Physical abuse Neg Hx    Sexual abuse Neg Hx    Seizures Neg Hx     Social History   Socioeconomic History   Marital status: Married    Spouse name: Not on file   Number of children: 3   Years of education: Not on file   Highest education level: Not on file  Occupational History   Occupation: disable    Employer: RETIRED    Comment: [employer]  Social Designer, fashion/clothing strain: Not on file   Food insecurity    Worry: Not on file    Inability: Not on file   Transportation needs    Medical: Not on file    Non-medical: Not on file  Tobacco Use   Smoking status: Current Some Day Smoker    Packs/day: 0.50    Years: 30.00    Pack years: 15.00    Types: Cigarettes    Start date: 07/31/1969   Smokeless tobacco: Former Systems developer   Tobacco comment: cutting back trying to quit  Substance and Sexual Activity   Alcohol use: Yes    Comment: Drinks a beer occasionally   Drug use: No   Sexual activity: Not Currently  Lifestyle   Physical activity    Days per week: Not on file    Minutes per session: Not on file   Stress: Not on file  Relationships   Social connections    Talks on phone: Not on file    Gets together: Not on file    Attends religious service: Not on file    Active member of club or organization: Not  on file    Attends meetings of clubs or organizations: Not on file    Relationship status: Not on file  Other Topics Concern   Not on file  Social History Narrative   3 stepchildren    Review of Systems: Gen: See HPI  CV: Denies chest pain, palpitations, peripheral edema. Resp: Denies dyspnea at rest. Admits to chronic cough.  GI: See HPI Derm: Denies rash, itching, dry skin Heme: See HPI  Physical Exam: BP (!) 145/88    Pulse 81    Temp (!) 96.9 F (36.1 C) (Temporal)    Ht 5\' 9"  (1.753 m)    Wt 168 lb (76.2 kg)    BMI 24.81 kg/m  General:   Alert and oriented. No distress noted. Pleasant and cooperative.  Head:  Normocephalic and atraumatic. Eyes:  Conjuctiva clear without scleral icterus. Heart:  S1, S2 present without murmurs appreciated. Lungs:  Clear to auscultation bilaterally. No wheezes, rales, or rhonchi. No distress.  Abdomen:  +BS, soft, non-tender and non-distended. No rebound or guarding. No HSM or masses noted. Rectal: External skin tags, internal hemorrhoids, some burning noted by the patient during exam but no sharp pain.  No blood on exam. Msk:  Symmetrical without gross deformities. Normal posture. Extremities:  Without edema. Neurologic:  Alert and  oriented x4 Psych:  Normal mood and affect.

## 2019-02-08 NOTE — Assessment & Plan Note (Signed)
Addressed under rectal bleeding 

## 2019-02-08 NOTE — Assessment & Plan Note (Addendum)
63 y.o. male with diarrhea for the last 1-2 years per patient. However, in further review of the chart, it appears patient has been reporting diarrhea for several years now. Prior work-up includes small bowel biopsies negative for celiac disease and negative random colon biopsies remotely.  Recent colonoscopy July 2019 with 2 polyps removed and grade 2 internal hemorrhoids. Repeat in 2024. At this time, stools are loose/watery, 3-4 a day, postprandial, and associated with abdominal cramping. Also some nocturnal stools. Patient has lost several family members over the years. Most recently his daughter committed suicide in December 2018, and he has been helping take care of her children. Per patient, this is when he remembers his stools becoming very loose/watery.  No antibiotics or hospitalizations.  He does drink well water but states he has a filter system on the well.  No regular NSAID use other than aspirin.  I suspect this is likely IBS-D but I would like to rule out infection with stools being watery and nocturnal at times.  Will obtain C. difficile and GI pathogen panel.  If negative, will plan to prescribe Bentyl 10 mg before meals and at bedtime as needed.

## 2019-02-08 NOTE — Telephone Encounter (Signed)
Can we get patient scheduled for hemorrhoid banding with Roseanne Kaufman in the next 4-6 weeks.

## 2019-02-08 NOTE — Telephone Encounter (Signed)
Noted  

## 2019-02-08 NOTE — Telephone Encounter (Signed)
Patient was a no show and letter sent  °

## 2019-02-08 NOTE — Patient Instructions (Signed)
Please complete labs and stool testing.   I am calling in hemorrhoid cream to [facility]. They will call you when it is ready.   We will get you scheduled for a hemorrhoid handing in the near future with Roxan Diesel, NP.   Aliene Altes, PA-C [facility]

## 2019-02-13 IMAGING — NM NM MYOCAR MULTI W/SPECT W/WALL MOTION & EF
2 series · 12 of 12 positions shown · non-contrast
Comparison: none

[Series 1: rest · 6.51mm/px · 6 of 64 frames shown]
[frame 6/64]
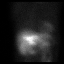
[frame 16/64]
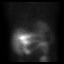
[frame 27/64]
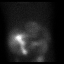
[frame 38/64]
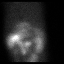
[frame 48/64]
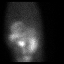
[frame 59/64]
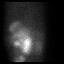

[Series 3: stress gated - perfusion · 6.51mm/px · 6 of 64 frames shown]
[frame 6/64]
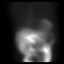
[frame 16/64]
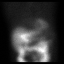
[frame 27/64]
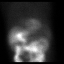
[frame 38/64]
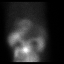
[frame 48/64]
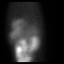
[frame 59/64]
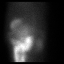

[12 of 12 positions shown; findings below may reference images not displayed]

Canned report from images found in remote index.

Refer to host system for actual result text.

## 2019-02-14 LAB — C. DIFFICILE GDH AND TOXIN A/B
GDH ANTIGEN: NOT DETECTED
MICRO NUMBER:: 820977
SPECIMEN QUALITY:: ADEQUATE
TOXIN A AND B: NOT DETECTED

## 2019-02-14 LAB — GASTROINTESTINAL PATHOGEN PANEL PCR
C. difficile Tox A/B, PCR: NOT DETECTED
Campylobacter, PCR: NOT DETECTED
Cryptosporidium, PCR: NOT DETECTED
E coli (ETEC) LT/ST PCR: NOT DETECTED
E coli (STEC) stx1/stx2, PCR: NOT DETECTED
E coli 0157, PCR: NOT DETECTED
Giardia lamblia, PCR: NOT DETECTED
Norovirus, PCR: NOT DETECTED
Rotavirus A, PCR: NOT DETECTED
Salmonella, PCR: NOT DETECTED
Shigella, PCR: NOT DETECTED

## 2019-02-23 ENCOUNTER — Other Ambulatory Visit: Payer: Self-pay | Admitting: Family Medicine

## 2019-03-08 ENCOUNTER — Other Ambulatory Visit: Payer: Self-pay

## 2019-03-08 ENCOUNTER — Ambulatory Visit: Payer: Medicare Other | Admitting: Gastroenterology

## 2019-03-08 ENCOUNTER — Encounter: Payer: Self-pay | Admitting: Gastroenterology

## 2019-03-08 ENCOUNTER — Telehealth: Payer: Self-pay | Admitting: Family Medicine

## 2019-03-08 VITALS — BP 126/84 | HR 65 | Temp 96.6°F | Ht 70.0 in | Wt 166.8 lb

## 2019-03-08 DIAGNOSIS — D72828 Other elevated white blood cell count: Secondary | ICD-10-CM | POA: Diagnosis not present

## 2019-03-08 DIAGNOSIS — R197 Diarrhea, unspecified: Secondary | ICD-10-CM | POA: Diagnosis not present

## 2019-03-08 DIAGNOSIS — K641 Second degree hemorrhoids: Secondary | ICD-10-CM | POA: Diagnosis not present

## 2019-03-08 MED ORDER — DICYCLOMINE HCL 10 MG PO CAPS: 10.0000 mg | ORAL_CAPSULE | Freq: Three times a day (TID) | ORAL | 3 refills | Status: DC

## 2019-03-08 NOTE — Patient Instructions (Signed)
Please start taking Bentyl one capsule before meals and at bedtime. Watch for constipation, dry mouth, dizziness. Stop if this occurs.  I would like to repeat your blood work today to see what your white count is doing.  If you have any abdominal pain, please call us. If diarrhea does not improve, please call.  I will see you in 2-3 weeks! Please use the Anusol cream twice a day per rectum  I enjoyed seeing you again today! As you know, I value our relationship and want to provide genuine, compassionate, and quality care. I welcome your feedback. If you receive a survey regarding your visit,  I greatly appreciate you taking time to fill this out. See you next time!  Annitta Needs, PhD, ANP-BC [facility]astro[facility]

## 2019-03-08 NOTE — Telephone Encounter (Signed)
HYDROcodone-acetaminophen (NORCO) 7.5-325 MG   Has appt next Thursday Oct. 1st. Just had hemorrhoid surgery and is in more pain than usual.  [facility]

## 2019-03-08 NOTE — Progress Notes (Signed)
[facility] Banding Note:  63 year old male with history of symptomatic hemorrhoids, previously banded in 2015 X 3 with good results. However, he has had recurrence of symptoms. Colonoscopy July 2019 with non-bleeding internal hemorrhoids, tubular adenomas. He has had rectal bleeding with loose bowel movements. History of chronic diarrhea. Cdiff and GI pathogen panel recently negative. CBC without anemia but did have non-specific elevated WBC count without signs/symptoms of infection. Denying abdominal pain, fever today.   The patient presents with symptomatic grade 2 hemorrhoids, unresponsive to maximal medical therapy, requesting rubber band ligation of his hemorrhoidal disease. All risks, benefits, and alternative forms of therapy were described and informed consent was obtained.  External exam with hemorrhoid tags, small amount of dried blood around anus. No obvious fissure. No significant pain with simple DRE. The decision was made to band the right posterior internal hemorrhoid, and the [place] was used to perform band ligation without complication. Digital anorectal examination was then performed to assure proper positioning of the band, and no adjustment was required.  The patient was discharged home without pain or other issues. Dietary and behavioral recommendations were given, along with follow-up instructions. I have sent in Bentyl to take before meals and at bedtime. I have asked him to call with how this does. We will also repeat CBC today.    The patient will return in 2-3 weeks for followup and possible additional banding as required.  No complications were encountered and the patient tolerated the procedure well.   Annitta Needs, PhD, ANP-BC [facility]

## 2019-03-09 ENCOUNTER — Other Ambulatory Visit: Payer: Self-pay | Admitting: Family Medicine

## 2019-03-09 LAB — CBC WITH DIFFERENTIAL/PLATELET
Absolute Monocytes: 519 cells/uL (ref 200–950)
Basophils Absolute: 47 cells/uL (ref 0–200)
Basophils Relative: 0.4 %
Eosinophils Absolute: 59 cells/uL (ref 15–500)
Eosinophils Relative: 0.5 %
HCT: 48.1 % (ref 38.5–50.0)
Hemoglobin: 17 g/dL (ref 13.2–17.1)
Lymphs Abs: 2891 cells/uL (ref 850–3900)
MCH: 32.9 pg (ref 27.0–33.0)
MCHC: 35.3 g/dL (ref 32.0–36.0)
MCV: 93.2 fL (ref 80.0–100.0)
MPV: 10.3 fL (ref 7.5–12.5)
Monocytes Relative: 4.4 %
Neutro Abs: 8284 cells/uL — ABNORMAL HIGH (ref 1500–7800)
Neutrophils Relative %: 70.2 %
Platelets: 215 10*3/uL (ref 140–400)
RBC: 5.16 10*6/uL (ref 4.20–5.80)
RDW: 13 % (ref 11.0–15.0)
Total Lymphocyte: 24.5 %
WBC: 11.8 10*3/uL — ABNORMAL HIGH (ref 3.8–10.8)

## 2019-03-09 MED ORDER — HYDROCODONE-ACETAMINOPHEN 7.5-325 MG PO TABS: 1.0000 | ORAL_TABLET | Freq: Two times a day (BID) | ORAL | 0 refills | Status: DC | PRN

## 2019-03-09 NOTE — Telephone Encounter (Signed)
The prescription for pain medicine was sent into his pharmacy please keep all follow-up visits

## 2019-03-12 NOTE — Telephone Encounter (Signed)
LMOM informed pt that Rx was called in & to please keep upcoming follow up visits & to call us if he needs anything

## 2019-03-13 NOTE — Progress Notes (Signed)
Hgb normal at 17. Wbc count improved to 11.8, slightly above normal. How is he doing?

## 2019-03-15 ENCOUNTER — Ambulatory Visit: Payer: Medicare Other | Admitting: Family Medicine

## 2019-03-17 ENCOUNTER — Other Ambulatory Visit: Payer: Self-pay | Admitting: Gastroenterology

## 2019-03-21 ENCOUNTER — Ambulatory Visit (INDEPENDENT_AMBULATORY_CARE_PROVIDER_SITE_OTHER): Payer: Medicare Other | Admitting: Family Medicine

## 2019-03-21 ENCOUNTER — Other Ambulatory Visit: Payer: Self-pay

## 2019-03-21 DIAGNOSIS — I10 Essential (primary) hypertension: Secondary | ICD-10-CM

## 2019-03-21 DIAGNOSIS — E785 Hyperlipidemia, unspecified: Secondary | ICD-10-CM | POA: Diagnosis not present

## 2019-03-21 DIAGNOSIS — Z125 Encounter for screening for malignant neoplasm of prostate: Secondary | ICD-10-CM

## 2019-03-21 DIAGNOSIS — G894 Chronic pain syndrome: Secondary | ICD-10-CM | POA: Diagnosis not present

## 2019-03-21 DIAGNOSIS — Z79899 Other long term (current) drug therapy: Secondary | ICD-10-CM

## 2019-03-21 DIAGNOSIS — J438 Other emphysema: Secondary | ICD-10-CM | POA: Diagnosis not present

## 2019-03-21 DIAGNOSIS — D72828 Other elevated white blood cell count: Secondary | ICD-10-CM

## 2019-03-21 DIAGNOSIS — F17200 Nicotine dependence, unspecified, uncomplicated: Secondary | ICD-10-CM

## 2019-03-21 MED ORDER — HYDROCODONE-ACETAMINOPHEN 7.5-325 MG PO TABS: ORAL_TABLET | ORAL | 0 refills | Status: DC

## 2019-03-21 MED ORDER — HYDROCODONE-ACETAMINOPHEN 7.5-325 MG PO TABS: 1.0000 | ORAL_TABLET | Freq: Two times a day (BID) | ORAL | 0 refills | Status: DC | PRN

## 2019-03-21 MED ORDER — LISINOPRIL 2.5 MG PO TABS: ORAL_TABLET | ORAL | 5 refills | Status: DC

## 2019-03-21 NOTE — Progress Notes (Signed)
Subjective:  Phone only video failed  Patient ID: [name], male    DOB: 1956-02-16, 63 y.o.   MRN: [id number]  HPI This patient was seen today for chronic pain. Takes hydrocodone 7.5/325mg  for leg pain.   The medication list was reviewed and updated.   -Compliance with medication: takes one bid  - Number patient states they take daily: 2  -when was the last dose patient took? today  The patient was advised the importance of maintaining medication and not using illegal substances with these.  Here for refills and follow up  The patient was educated that we can provide 3 monthly scripts for their medication, it is their responsibility to follow the instructions.  Side effects or complications from medications: none  Patient is aware that pain medications are meant to minimize the severity of the pain to allow their pain levels to improve to allow for better function. They are aware of that pain medications cannot totally remove their pain.  Due for UDT ( at least once per year) : doing virtual appt today. Last one was 03/30/18  Concerns about elevated WBC on bloodwork in august.  With patient's white blood cell being elevated this is possibly related to smoking but it could be a underlying myeloproliferative disease therefore repeated again if it is still elevated will need potentially hematology consultation  Patient for blood pressure check up.  The patient does have hypertension.  The patient is on medication.  Patient relates compliance with meds. Todays BP reviewed with the patient. Patient denies issues with medication. Patient relates reasonable diet. Patient tries to minimize salt. Patient aware of BP goals. Does take his medicines unable to check his blood pressure currently  History COPD he does use his inhalers he has been encouraged to quit smoking unlikely he will do so denies any shortness of breath currently is doing the best he can at minimizing risk of COVID   Has known coronary artery disease but denies any type of chest tightness pressure pain it is very important to keep cholesterol under good control he will continue taking his medicine and he will check lab work   Virtual Visit via Telephone Note  I connected with Alanson Aly on 03/21/19 at  9:30 AM EDT by telephone and verified that I am speaking with the correct person using two identifiers.  Location: Patient: home Provider: office   I discussed the limitations, risks, security and privacy concerns of performing an evaluation and management service by telephone and the availability of in person appointments. I also discussed with the patient that there may be a patient responsible charge related to this service. The patient expressed understanding and agreed to proceed.   History of Present Illness:    Observations/Objective:   Assessment and Plan:   Follow Up Instructions:    I discussed the assessment and treatment plan with the patient. The patient was provided an opportunity to ask questions and all were answered. The patient agreed with the plan and demonstrated an understanding of the instructions.   The patient was advised to call back or seek an in-person evaluation if the symptoms worsen or if the condition fails to improve as anticipated.  I provided 25 minutes of non-face-to-face time during this encounter.          Review of Systems  Constitutional: Negative for diaphoresis and fatigue.  HENT: Negative for congestion and rhinorrhea.   Respiratory: Negative for cough and shortness of breath.  Cardiovascular: Negative for chest pain and leg swelling.  Gastrointestinal: Negative for abdominal pain and diarrhea.  Skin: Negative for color change and rash.  Neurological: Negative for dizziness and headaches.  Psychiatric/Behavioral: Negative for behavioral problems and confusion.       Objective:   Physical Exam Vitals signs reviewed.   Constitutional:      General: He is not in acute distress. HENT:     Head: Normocephalic and atraumatic.  Eyes:     General:        Right eye: No discharge.        Left eye: No discharge.  Neck:     Trachea: No tracheal deviation.  Cardiovascular:     Rate and Rhythm: Normal rate and regular rhythm.     Heart sounds: Normal heart sounds. No murmur.  Pulmonary:     Effort: Pulmonary effort is normal. No respiratory distress.     Breath sounds: Normal breath sounds.  Lymphadenopathy:     Cervical: No cervical adenopathy.  Skin:    General: Skin is warm and dry.  Neurological:     Mental Status: He is alert.     Coordination: Coordination normal.  Psychiatric:        Behavior: Behavior normal.           Assessment & Plan:  1. Other emphysema ([place]) Patient does have COPD he is encouraged to stay away from smoking use inhaler as needed - Lipid panel - Hepatic function panel - Basic metabolic panel - PSA - CBC with Differential/Platelet  2. Essential hypertension Blood pressure reportedly under good control continue current measures minimize salt in diet - Lipid panel - Hepatic function panel - Basic metabolic panel - PSA - CBC with Differential/Platelet  3. Chronic pain syndrome Has chronic back pain hip pain knee pain recommend pain medicine no more than 2/day The patient was seen in followup for chronic pain. A review over at their current pain status was discussed. Drug registry was checked. Prescriptions were given. Discussion was held regarding the importance of compliance with medication as well as pain medication contract.  Time for questions regarding pain management plan occurred. Importance of regular followup visits was discussed. Patient was informed that medication may cause drowsiness and should not be combined  with other medications/alcohol or street drugs. Patient was cautioned that medication could cause drowsiness. If the patient feels  medication is causing altered alertness then do not drive or operate dangerous equipment.   - Lipid panel - Hepatic function panel - Basic metabolic panel - PSA - CBC with Differential/Platelet  4. Hyperlipidemia, unspecified hyperlipidemia type Hyperlipidemia with heart disease very important to keep LDL below 70 he needs to do lab work - Lipid panel - Hepatic function panel - Basic metabolic panel - PSA - CBC with Differential/Platelet  5. TOBACCO ABUSE Patient was counseled to quit smoking unlikely he will do so - Lipid panel - Hepatic function panel - Basic metabolic panel - PSA - CBC with Differential/Platelet  6. Other elevated white blood cell (WBC) count Recent visit to GI white blood cell elevated therefore repeat this if it is still elevated may need referral to hematology for further evaluation - Lipid panel - Hepatic function panel - Basic metabolic panel - PSA - CBC with Differential/Platelet  7. Screening for prostate cancer Screening for prostate - PSA  8. High risk medication use Liver function because of medications - Hepatic function panel   25 minutes was spent with the patient.  This statement verifies that 25 minutes was indeed spent with the patient.  More than 50% of this visit-total duration of the visit-was spent in counseling and coordination of care. The issues that the patient came in for today as reflected in the diagnosis (s) please refer to documentation for further details.

## 2019-03-28 NOTE — Progress Notes (Signed)
Patient has an ov tomorrow and stated he was doing fine

## 2019-03-29 ENCOUNTER — Other Ambulatory Visit: Payer: Self-pay

## 2019-03-29 ENCOUNTER — Ambulatory Visit: Payer: Medicare Other | Admitting: Gastroenterology

## 2019-03-29 ENCOUNTER — Encounter: Payer: Self-pay | Admitting: Gastroenterology

## 2019-03-29 ENCOUNTER — Telehealth: Payer: Self-pay | Admitting: Internal Medicine

## 2019-03-29 VITALS — BP 155/91 | HR 96 | Temp 97.1°F | Ht 70.0 in | Wt 167.2 lb

## 2019-03-29 DIAGNOSIS — K642 Third degree hemorrhoids: Secondary | ICD-10-CM

## 2019-03-29 MED ORDER — RIFAXIMIN 550 MG PO TABS: 550.0000 mg | ORAL_TABLET | Freq: Three times a day (TID) | ORAL | 0 refills | Status: AC

## 2019-03-29 NOTE — Patient Instructions (Signed)
I have sent in Xifaxan to take three times a day for 2 weeks. This may need a prior authorization. We will try to get this approved for you.  If Xifaxan is not helpful, we will trial pancreatic enzyme capsules with meals.  You will probably still have some hemorrhoid symptoms, because there is still an additional column that needs banding.   We will see you in 2 weeks or so for further banding!  Please call with any pain. Pressure is normal.  I enjoyed seeing you again today! As you know, I value our relationship and want to provide genuine, compassionate, and quality care. I welcome your feedback. If you receive a survey regarding your visit,  I greatly appreciate you taking time to fill this out. See you next time!  Annitta Needs, PhD, ANP-BC [facility]

## 2019-03-29 NOTE — Telephone Encounter (Signed)
Patient wife called and said that the pharmacy was sending [place] a prior auth for his prescription we sent in, I told her I would let the nurse know to be on the lookout

## 2019-03-29 NOTE — Telephone Encounter (Signed)
Noted  

## 2019-03-29 NOTE — Progress Notes (Signed)
[facility] Banding Note:   63 year old male with history of symptomatic hemorrhoids, previously banded in 2015 X 3 with good results. However, he has had recurrence of symptoms. Colonoscopy July 2019 with non-bleeding internal hemorrhoids, tubular adenomas. He has had rectal bleeding with loose bowel movements. History of chronic diarrhea. Cdiff and GI pathogen panel recently negative. Previously banding right posterior hemorrhoid. Due to non-specific elevation of CBC in Aug 2020, we repeated and it is improved to 11.8. Will need to follow-up with PCP. Chronic loose stools, watery. Bentyl prescribed in Sept 2020 to trial. Previous work-up of diarrhea in the past have included small bowel biopsies negative for celiac disease, negative random colon biopsies remotely. Sometimes abdominal cramping prior to BMs. Stays away from dairy products. Appetite is so-so. Daughter passed away 2 years ago, now taking care of grandchildren full time. Bentyl has not been helpful. Greasy stools at times. Chronic diarrhea even prior to daughter's passing.   The patient presents with symptomatic grade 3 hemorrhoids, unresponsive to maximal medical therapy, requesting rubber band ligation of his/her hemorrhoidal disease. All risks, benefits, and alternative forms of therapy were described and informed consent was obtained.  The decision was made to band the left lateral internal hemorrhoid, and the [place] was used to perform band ligation without complication. Digital anorectal examination was then performed to assure proper positioning of the band, and no adjustment was required. With DRE, the band felt to be more in the right anterior position. I suspect this column is more prominent and may have pulled up excess despite the left lateral approach. Attention will be focused on the right anterior at next visit. The band was in good position. The patient was discharged home without pain or other issues. Dietary and behavioral  recommendations were given.  Xifaxan sent to pharmacy for 2 weeks. If no improvement, trial pancreatic enzymes.   No complications were encountered and the patient tolerated the procedure well.  Annitta Needs, PhD, ANP-BC [facility]

## 2019-04-13 ENCOUNTER — Other Ambulatory Visit: Payer: Self-pay

## 2019-04-13 ENCOUNTER — Encounter: Payer: Self-pay | Admitting: Gastroenterology

## 2019-04-13 ENCOUNTER — Ambulatory Visit: Payer: Medicare Other | Admitting: Gastroenterology

## 2019-04-13 VITALS — BP 142/87 | HR 82 | Temp 97.6°F | Ht 70.0 in | Wt 167.8 lb

## 2019-04-13 DIAGNOSIS — K642 Third degree hemorrhoids: Secondary | ICD-10-CM | POA: Insufficient documentation

## 2019-04-13 DIAGNOSIS — K649 Unspecified hemorrhoids: Secondary | ICD-10-CM

## 2019-04-13 MED ORDER — HYDROCORTISONE (PERIANAL) 2.5 % EX CREA: 1.0000 "application " | TOPICAL_CREAM | Freq: Two times a day (BID) | CUTANEOUS | 1 refills | Status: DC

## 2019-04-13 NOTE — Progress Notes (Signed)
[facility] Banding Note:   63 year old male with history of symptomatic hemorrhoids, previously banded in 2015 X 3 with good results. However, he has had recurrence of symptoms. Colonoscopy July 2019 with non-bleeding internal hemorrhoids, tubular adenomas.He has had rectal bleeding with loose bowel movements. History of chronic diarrhea. Cdiff and GI pathogen panel negative.  Chronic loose stools, watery. Bentyl prescribed in Sept 2020 to trial. Previous work-up ofdiarrheain the past have included small bowel biopsies negative for celiac disease, negative random colon biopsies remotely. Sometimes abdominal cramping prior to BMs. Stays away from dairy products. Appetite is so-so. Daughter passed away 2 years ago, now taking care of grandchildren full time. Bentyl has not been helpful. Greasy stools at times. Chronic diarrhea even prior to daughter's passing. Prescribed Xifaxan at last visit but unable to afford. Thus far, have banded  right posterior hemorrhoid and left lateral. Some improvement with bleeding. Still with itching and burning. Prolapsing tissue that is reducible.      The patient presents with symptomatic grade 3 hemorrhoids, unresponsive to maximal medical therapy, requesting rubber band ligation of his hemorrhoidal disease. All risks, benefits, and alternative forms of therapy were described and informed consent was obtained.  The decision was made to band the right anterior internal hemorrhoid, and the [place] was used to perform band ligation without complication. Digital anorectal examination was then performed to assure proper positioning of the band, and to adjust the banded tissue as required. The patient was discharged home without pain or other issues. Dietary and behavioral recommendations were given, a refill on Anusol BID, and Creon 36,000 unit capsules to trial 2 per meal and 1 with snacks., The patient will return in 2-3 weeks for followup; I feel he would be a good  candidate for a 4th banding in the neutral position likely. Will reassess at next visit.   No complications were encountered and the patient tolerated the procedure well.  Annitta Needs, PhD, ANP-BC [facility]

## 2019-04-13 NOTE — Patient Instructions (Signed)
I would like for you to start taking Creon 2 capsules with meals and 1 with snacks. Let me know if this helps with the looser stool.  I have sent in the cream to your pharmacy again. You would benefit from lidocaine ointment over the counter to use per rectum as needed, too. That is what I used today after the banding, and it helps provide some relief in addition to the cream.   I will see you in 2-3 weeks. I believe you would do well with one "neutral" banding, where we place the ligator in a neutral position.   Please call with any problems in the meantime!  I enjoyed seeing you again today! As you know, I value our relationship and want to provide genuine, compassionate, and quality care. I welcome your feedback. If you receive a survey regarding your visit,  I greatly appreciate you taking time to fill this out. See you next time!  Annitta Needs, PhD, ANP-BC [facility]

## 2019-04-19 ENCOUNTER — Other Ambulatory Visit: Payer: Self-pay

## 2019-04-19 NOTE — Telephone Encounter (Signed)
PA for Xifaxin was submitted through [email or web address]. medication was approved. lmom for pt. Pt notified that medication was approved. Approval letter will be scanned in chart when received.

## 2019-04-20 MED ORDER — PANTOPRAZOLE SODIUM 40 MG PO TBEC: 40.0000 mg | DELAYED_RELEASE_TABLET | Freq: Two times a day (BID) | ORAL | 3 refills | Status: DC

## 2019-05-01 ENCOUNTER — Other Ambulatory Visit: Payer: Self-pay | Admitting: Cardiology

## 2019-05-04 ENCOUNTER — Other Ambulatory Visit: Payer: Self-pay

## 2019-05-04 ENCOUNTER — Ambulatory Visit: Payer: Medicare Other | Admitting: Gastroenterology

## 2019-05-04 ENCOUNTER — Encounter: Payer: Self-pay | Admitting: Gastroenterology

## 2019-05-04 VITALS — BP 142/91 | HR 76 | Temp 97.3°F | Ht 70.0 in | Wt 168.6 lb

## 2019-05-04 DIAGNOSIS — K641 Second degree hemorrhoids: Secondary | ICD-10-CM | POA: Diagnosis not present

## 2019-05-04 MED ORDER — PANCRELIPASE (LIP-PROT-AMYL) 36000-114000 UNITS PO CPEP: 72000.0000 [IU] | ORAL_CAPSULE | Freq: Three times a day (TID) | ORAL | 3 refills | Status: DC

## 2019-05-04 MED ORDER — HYDROCORTISONE (PERIANAL) 2.5 % EX CREA: 1.0000 "application " | TOPICAL_CREAM | Freq: Two times a day (BID) | CUTANEOUS | 1 refills | Status: DC

## 2019-05-04 NOTE — Progress Notes (Signed)
[facility] Banding Note:     63 year old male with history of symptomatic hemorrhoids, previously banded in 2015 X 3 with good results. However, he has had recurrence of symptoms. Colonoscopy July 2019 with non-bleeding internal hemorrhoids, tubular adenomas.He has had rectal bleeding with loose bowel movements. History of chronic diarrhea. Cdiff and GI pathogen panel negative.  Chronic loose stools, watery. Bentyl prescribed in Sept 2020 to trial.Previous work-up ofdiarrheain the past have included small bowel biopsies negative for celiac disease, negative random colon biopsies remotely.Sometimes abdominal cramping prior to BMs. Stays away from dairy products. Appetite is so-so. Daughter passed away 2 years ago, now taking care of grandchildren full time.Bentyl has not been helpful, and he was unable to afford Xifaxan. Recently have banded right posterior hemorrhoid, left lateral, right anterior. He was given samples of Creon at last visit to trial. Returns today for consideration of repeat banding, likely in the neutral position. He notes that Creon has helped. He has completed the samples. He has noted improvement from last banding session. Still with occasional prolapse but improved.   The patient presents with symptomatic grade 2 hemorrhoids, unresponsive to maximal medical therapy, requesting rubber band ligation of his hemorrhoidal disease. All risks, benefits, and alternative forms of therapy were described and informed consent was obtained.   The decision was made to band in the neutral position, and the [place] was used to perform band ligation without complication. Digital anorectal examination was then performed to assure proper positioning of the band, and no adjustment was required. The patient was discharged home without pain or other issues. Dietary and behavioral recommendations were given, Creon samples and prescription provided for 72,000 units with meals and 36,000 units with  snacks, and Anusol cream.  instructions. The patient will return in 3 months for routine follow-up. He is to call with any issues in the meantime, and we will see him sooner.   No complications were encountered and the patient tolerated the procedure well.   Annitta Needs, PhD, ANP-BC [facility]

## 2019-05-04 NOTE — Patient Instructions (Signed)
I have sent Creon to your pharmacy. Take 2 capsules with each meal and 1 with snacks.  I also sent the cream in. Please let me know if you have any trouble getting these items!  We will see you back in 3 months. Please call me if you feel you are still having symptoms.  Have a good holiday season!  I enjoyed seeing you again today! As you know, I value our relationship and want to provide genuine, compassionate, and quality care. I welcome your feedback. If you receive a survey regarding your visit,  I greatly appreciate you taking time to fill this out. See you next time!  Annitta Needs, PhD, ANP-BC [facility]

## 2019-05-05 ENCOUNTER — Other Ambulatory Visit: Payer: Self-pay | Admitting: Family Medicine

## 2019-05-14 ENCOUNTER — Other Ambulatory Visit: Payer: Self-pay | Admitting: Family Medicine

## 2019-05-25 ENCOUNTER — Other Ambulatory Visit (HOSPITAL_COMMUNITY): Payer: Self-pay | Admitting: Psychiatry

## 2019-05-30 ENCOUNTER — Ambulatory Visit: Payer: Medicare Other | Admitting: Family Medicine

## 2019-06-18 ENCOUNTER — Telehealth: Payer: Self-pay | Admitting: Family Medicine

## 2019-06-18 ENCOUNTER — Other Ambulatory Visit: Payer: Self-pay | Admitting: Family Medicine

## 2019-06-18 MED ORDER — HYDROCODONE-ACETAMINOPHEN 7.5-325 MG PO TABS: ORAL_TABLET | ORAL | 0 refills | Status: DC

## 2019-06-18 NOTE — Telephone Encounter (Signed)
Please advise. Thank you

## 2019-06-18 NOTE — Telephone Encounter (Signed)
Pt wife contacted and verbalized understanding.  

## 2019-06-18 NOTE — Telephone Encounter (Signed)
Pt's wife calling checking on HYDROcodone-acetaminophen (NORCO) 7.5-325 MG tablet. Pharmacy is stating they are waiting on Dr. [name] to sign off on them to fill it. Pt has been out of medication for couple days.   Pt has med check 1/7  [facility] #[id number] [address], [place].

## 2019-06-18 NOTE — Telephone Encounter (Signed)
Prescription was sent in as requested keep follow-up

## 2019-06-19 ENCOUNTER — Other Ambulatory Visit (HOSPITAL_COMMUNITY): Payer: Self-pay | Admitting: Psychiatry

## 2019-06-21 ENCOUNTER — Other Ambulatory Visit: Payer: Self-pay

## 2019-06-21 ENCOUNTER — Ambulatory Visit (INDEPENDENT_AMBULATORY_CARE_PROVIDER_SITE_OTHER): Payer: Medicare PPO | Admitting: Family Medicine

## 2019-06-21 DIAGNOSIS — G2581 Restless legs syndrome: Secondary | ICD-10-CM

## 2019-06-21 DIAGNOSIS — M25511 Pain in right shoulder: Secondary | ICD-10-CM

## 2019-06-21 DIAGNOSIS — I10 Essential (primary) hypertension: Secondary | ICD-10-CM | POA: Diagnosis not present

## 2019-06-21 DIAGNOSIS — E785 Hyperlipidemia, unspecified: Secondary | ICD-10-CM | POA: Diagnosis not present

## 2019-06-21 DIAGNOSIS — G894 Chronic pain syndrome: Secondary | ICD-10-CM | POA: Diagnosis not present

## 2019-06-21 DIAGNOSIS — J438 Other emphysema: Secondary | ICD-10-CM | POA: Diagnosis not present

## 2019-06-21 MED ORDER — HYDROCODONE-ACETAMINOPHEN 7.5-325 MG PO TABS: ORAL_TABLET | ORAL | 0 refills | Status: DC

## 2019-06-21 MED ORDER — DICLOFENAC SODIUM 75 MG PO TBEC: 75.0000 mg | DELAYED_RELEASE_TABLET | Freq: Two times a day (BID) | ORAL | 0 refills | Status: DC | PRN

## 2019-06-21 MED ORDER — HYDROCODONE-ACETAMINOPHEN 7.5-325 MG PO TABS: 1.0000 | ORAL_TABLET | Freq: Two times a day (BID) | ORAL | 0 refills | Status: DC | PRN

## 2019-06-21 NOTE — Progress Notes (Addendum)
Subjective:    Patient ID: [name], male    DOB: Jan 16, 1956, 64 y.o.   MRN: [id number]  HPI  This patient was seen today for chronic pain  The medication list was reviewed and updated.   -Compliance with medication: yes  - Number patient states they take daily: 2  -when was the last dose patient took? today  The patient was advised the importance of maintaining medication and not using illegal substances with these.  Here for refills and follow up  The patient was educated that we can provide 3 monthly scripts for their medication, it is their responsibility to follow the instructions.  Side effects or complications from medications: none  Patient is aware that pain medications are meant to minimize the severity of the pain to allow their pain levels to improve to allow for better function. They are aware of that pain medications cannot totally remove their pain.  Essential hypertension  Other emphysema ([facility])  Hyperlipidemia, unspecified hyperlipidemia type  RLS (restless legs syndrome)  Chronic pain syndrome     Virtual Visit via Video Note  I connected with [name] on 06/21/19 at  8:30 AM EST by a video enabled telemedicine application and verified that I am speaking with the correct person using two identifiers.  Location: Patient: home Provider: office   I discussed the limitations of evaluation and management by telemedicine and the availability of in person appointments. The patient expressed understanding and agreed to proceed.  History of Present Illness:    Observations/Objective:   Assessment and Plan:   Follow Up Instructions:    I discussed the assessment and treatment plan with the patient. The patient was provided an opportunity to ask questions and all were answered. The patient agreed with the plan and demonstrated an understanding of the instructions.   The patient was advised to call back or seek an in-person evaluation  if the symptoms worsen or if the condition fails to improve as anticipated.  I provided 30 minutes of non-face-to-face time during this encounter.        Review of Systems  Constitutional: Positive for fatigue. Negative for diaphoresis.  HENT: Negative for congestion and rhinorrhea.   Respiratory: Negative for cough and shortness of breath.   Cardiovascular: Negative for chest pain and leg swelling.  Gastrointestinal: Negative for abdominal pain and diarrhea.  Musculoskeletal: Positive for arthralgias and back pain.       Right shoulder pain stiffness hurts to raise it move it no known injury  Skin: Negative for color change and rash.  Neurological: Negative for dizziness and headaches.  Psychiatric/Behavioral: Negative for behavioral problems and confusion.       Objective:   Physical Exam  Today's visit was via telephone Physical exam was not possible for this visit       Assessment & Plan:  1. Essential hypertension Blood pressure under good control takes his medicine regular basis watches salt in his diet continue current measures  2. Other emphysema ([place]) Patient is working hard on trying to quit smoking.  He does use his inhalers denies any breathing troubles as long as he stays cold weather  3. Hyperlipidemia, unspecified hyperlipidemia type Patient with significant hyperlipidemia takes his medicine watches diet previous labs were checked new labs will not be until spring or early summer due to Covid  4. RLS (restless legs syndrome) Restless legs he does well with the medicine denies any major setback problems  5. Chronic pain syndrome Does  have chronic pain in his back radiates into the legs pain medicine does allow him to function better he denies it causing drowsiness.  He has been on the same dose for a long time but does well with it drug registry was checked 3 scripts given  6. Acute pain of right shoulder Has acute pain in the right shoulder may use  anti-inflammatory twice daily over the course the next 2 weeks range of motion exercises recommended if not doing dramatically better over the next 2 weeks next step would be follow-up in office visit for evaluation possible x-rays  Long discussion held regarding Covid Covid protection and Covid vaccine Follow-up 3 months

## 2019-07-10 ENCOUNTER — Other Ambulatory Visit: Payer: Self-pay

## 2019-07-10 ENCOUNTER — Ambulatory Visit: Payer: Medicare PPO | Admitting: Family Medicine

## 2019-07-10 VITALS — BP 122/82 | Temp 98.1°F | Wt 173.6 lb

## 2019-07-10 DIAGNOSIS — M778 Other enthesopathies, not elsewhere classified: Secondary | ICD-10-CM

## 2019-07-10 MED ORDER — MELOXICAM 15 MG PO TABS: 15.0000 mg | ORAL_TABLET | Freq: Every day | ORAL | 0 refills | Status: DC

## 2019-07-10 MED ORDER — PREDNISONE 20 MG PO TABS: ORAL_TABLET | ORAL | 0 refills | Status: DC

## 2019-07-10 NOTE — Progress Notes (Signed)
   Subjective:    Patient ID: [name], male    DOB: [birth year], 64 y.o.   MRN: [id number]  HPI  Patient arrives with left shoulder pain for a few months. Patient states he can't really lift his arm due to pain. Patient relates a lot of discomfort in his left shoulder relates has difficult time raising his arm denies any injury.  States his overall function has been good up until this point.  Never had this problem before states he is to the point now where he is hardly using his arm.  Denies numbness tingling.  PMH benign. Review of Systems  Constitutional: Negative for activity change.  HENT: Negative for congestion and rhinorrhea.   Respiratory: Negative for cough and shortness of breath.   Cardiovascular: Negative for chest pain.  Gastrointestinal: Negative for abdominal pain, diarrhea, nausea and vomiting.  Genitourinary: Negative for dysuria and hematuria.  Neurological: Negative for weakness and headaches.  Psychiatric/Behavioral: Negative for behavioral problems and confusion.       Objective:   Physical Exam Vitals reviewed.  Constitutional:      General: He is not in acute distress. HENT:     Head: Normocephalic and atraumatic.  Eyes:     General:        Right eye: No discharge.        Left eye: No discharge.  Neck:     Trachea: No tracheal deviation.  Cardiovascular:     Rate and Rhythm: Normal rate and regular rhythm.     Heart sounds: Normal heart sounds. No murmur.  Pulmonary:     Effort: Pulmonary effort is normal. No respiratory distress.     Breath sounds: Normal breath sounds.  Lymphadenopathy:     Cervical: No cervical adenopathy.  Skin:    General: Skin is warm and dry.  Neurological:     Mental Status: He is alert.     Coordination: Coordination normal.  Psychiatric:        Behavior: Behavior normal.    Patient has decreased range of motion of the left shoulder.  Passive range of motion is good active ranges diminished strength is hard to  tell for certainty because of the pain he is having but I doubt a rotator cuff tear I believe is more likely tendinitis       Assessment & Plan:  Significant shoulder tendinitis I do not detect a rotator cuff tear I recommend range of motion exercises handout was given to the patient Short course of prednisone taper After that anti-inflammatory over the course of the next 2 to 4 weeks Referral to orthopedics for further evaluation More than likely they will do x-ray injection and physical therapy.

## 2019-07-11 ENCOUNTER — Encounter: Payer: Self-pay | Admitting: Family Medicine

## 2019-07-19 ENCOUNTER — Ambulatory Visit (INDEPENDENT_AMBULATORY_CARE_PROVIDER_SITE_OTHER): Payer: Self-pay

## 2019-07-19 ENCOUNTER — Telehealth: Payer: Self-pay | Admitting: Family Medicine

## 2019-07-19 ENCOUNTER — Ambulatory Visit (INDEPENDENT_AMBULATORY_CARE_PROVIDER_SITE_OTHER): Payer: Self-pay | Admitting: Orthopaedic Surgery

## 2019-07-19 ENCOUNTER — Encounter: Payer: Self-pay | Admitting: Orthopaedic Surgery

## 2019-07-19 ENCOUNTER — Other Ambulatory Visit: Payer: Self-pay | Admitting: Family Medicine

## 2019-07-19 VITALS — BP 126/80 | HR 80 | Ht 70.0 in | Wt 176.0 lb

## 2019-07-19 DIAGNOSIS — M25512 Pain in left shoulder: Secondary | ICD-10-CM

## 2019-07-19 DIAGNOSIS — M75122 Complete rotator cuff tear or rupture of left shoulder, not specified as traumatic: Secondary | ICD-10-CM

## 2019-07-19 MED ORDER — LIDOCAINE HCL 1 % IJ SOLN
0.5000 mL | INTRAMUSCULAR | Status: AC | PRN
  Administered 2019-07-19: 14:00:00 .5 mL

## 2019-07-19 MED ORDER — METHYLPREDNISOLONE ACETATE 40 MG/ML IJ SUSP
40.0000 mg | INTRAMUSCULAR | Status: AC | PRN
  Administered 2019-07-19: 40 mg via INTRA_ARTICULAR

## 2019-07-19 MED ORDER — BUPIVACAINE HCL 0.25 % IJ SOLN
4.0000 mL | INTRAMUSCULAR | Status: AC | PRN
  Administered 2019-07-19: 4 mL via INTRA_ARTICULAR

## 2019-07-19 NOTE — Telephone Encounter (Signed)
Pt requesting refill on HYDROcodone-acetaminophen (NORCO) 7.5-325 MG tablet   Pt states that the pharmacy told him that we have to send this in each fill (?)  Please advise & call pt      [facility] - [address]

## 2019-07-19 NOTE — Progress Notes (Signed)
Office Visit Note   Patient: [name]           Date of Birth: [birth year]           MRN: [id number] Visit Date: 07/19/2019              Requested by: [name], MD [address],  [place] [id number] PCP: [name], MD   Assessment & Plan: Visit Diagnoses:  1. Acute pain of left shoulder   2. Complete tear of left rotator cuff, unspecified whether traumatic     Plan: Subacromial injection performed if he has persistent problems will call let us know we will order a MRI scan left shoulder ruled out rotator cuff tear with calcific tendinopathy.  Follow-Up Instructions: No follow-ups on file.   Orders:  Orders Placed This Encounter  Procedures  . Large Joint Inj  . XR Shoulder Left   No orders of the defined types were placed in this encounter.     Procedures: Large Joint Inj: L subacromial bursa on 07/19/2019 2:10 PM Indications: pain Details: 22 G 1.5 in needle  Arthrogram: No  Medications: 4 mL bupivacaine 0.25 %; 40 mg methylPREDNISolone acetate 40 MG/ML; 0.5 mL lidocaine 1 % Outcome: tolerated well, no immediate complications Procedure, treatment alternatives, risks and benefits explained, specific risks discussed. Consent was given by the patient. Immediately prior to procedure a time out was called to verify the correct patient, procedure, equipment, support staff and site/side marked as required. Patient was prepped and draped in the usual sterile fashion.       Clinical Data: No additional findings.   Subjective: Chief Complaint  Patient presents with  . Left Shoulder - Pain    HPI 64 year old male with several month history of left nondominant shoulder pain and weakness inability to abduct to reach over his head.  He has a 17-year-old grandchild 84 pounds he has been lifting and caring and this is aggravated his symptoms it bothers him at night problems washing his hair.  No history of falls or specific injury.  Patient  states he is disabled from heart trouble.. Patient has been on prednisone also meloxicam with some improvement.  He has been taking hydrocodone 7.5/325 for the pain.  Patient also takes aprazolam  1 mg..  Review of Systems 14 point review of systems positive for smoking depression anxiety heart disease hypertension dysphagia hemorrhoids hernia problems otherwise negative as pertains HPI.   Objective: Vital Signs: BP 126/80   Pulse 80   Ht 5\' 10"  (1.778 m)   Wt 176 lb (79.8 kg)   BMI 25.25 kg/m   Physical Exam Constitutional:      Appearance: He is well-developed.  HENT:     Head: Normocephalic and atraumatic.  Eyes:     Pupils: Pupils are equal, round, and reactive to light.  Neck:     Thyroid: No thyromegaly.     Trachea: No tracheal deviation.  Cardiovascular:     Rate and Rhythm: Normal rate.  Pulmonary:     Effort: Pulmonary effort is normal.     Breath sounds: No wheezing.  Abdominal:     General: Bowel sounds are normal.     Palpations: Abdomen is soft.  Skin:    General: Skin is warm and dry.     Capillary Refill: Capillary refill takes less than 2 seconds.  Neurological:     Mental Status: He is alert and oriented to person, place, and time.  Psychiatric:        Behavior: Behavior normal.        Thought Content: Thought content normal.        Judgment: Judgment normal.     Ortho Exam patient is positive positive drop arm test on the left negative on the right positive impingement some mild subscap weakness.  Tenderness in the supraspinatus fossa negative brachial plexus tenderness negative Spurling.  Reflexes are 2+ good capillary refill no rash over exposed skin.  Specialty Comments:  No specialty comments available.  Imaging: XR Shoulder Left  Result Date: 07/19/2019 Three-view x-rays left shoulder demonstrate some rotator cuff calcification superior portion of the cuff.  No glenohumeral arthritis no subluxation and no high riding of the humeral head.  Impression: Calcific tendinopathy left rotator cuff.    PMFS History: Patient Active Problem List   Diagnosis Date Noted  . Complete tear of left rotator cuff 07/19/2019  . Grade III hemorrhoids 04/13/2019  . Grade II hemorrhoids 03/08/2019  . Diarrhea 02/08/2019  . Dysphagia   . Hiatal hernia   . History of colonic polyps   . Diverticulosis of colon without hemorrhage   . Hemorrhoids   . Gastroparesis 11/20/2013  . Chronic pain syndrome 10/23/2013  . Nausea alone 07/23/2013  . Hyperglycemia 04/25/2013  . 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 anxiety 09/20/2012  . Pain in limb 09/20/2012  . Loose stools 09/07/2012  . Dyspepsia 09/05/2012  . Dysphagia, unspecified(787.20) 09/05/2012  . Gastroesophageal reflux disease 05/09/2012  . Lung nodule 07/16/2011  . RLS (restless legs syndrome) 03/09/2011  . Chronic diarrhea 12/21/2010  . Leukocytosis 12/21/2010  . TOBACCO ABUSE 09/23/2009  . ATHEROSCLEROTIC CARDIOVASCULAR DISEASE 09/23/2009  . Hyperlipidemia 10/01/2008  . Essential hypertension 10/01/2008  . Depression with anxiety 03/31/2007  . OBSTRUCTIVE SLEEP APNEA 03/31/2007  . Chronic obstructive pulmonary disease ([place]) 03/31/2007   Past Medical History:  Diagnosis Date  . Anxiety   . Anxiety and depression   . ASCVD (arteriosclerotic cardiovascular disease)    inferior MI with circumflex PTCA in 1993;cath in 2000-50% OM 1&2,30% LAD ;2008-25% LAD, 80% nondominant right ,60% small OM 2,EF of 55% with severe basilar inferior hypokinesis; stress nuclear study in 04/2008 moderate inferior scar with peri-infarction ischemic  . Asthma   . CAD (coronary artery disease)   . Carcinoma in situ of colon 2004   rectal polyp  . Colitis, ischemic ([place]) 2011  . COPD (chronic obstructive pulmonary disease) ([facility])    mild ;excercise induced hypoxemia by cp stress test ;asthma ,bronchitis,  . Depression   . Diverticulosis   . Gastritis 12/30/10   EGD Dr [name]  . GERD (gastroesophageal  reflux disease)   . H. pylori infection 2004   treated  . Hemorrhoids   . Hiatal hernia   . Hyperlipidemia   . Hypertension   . Inflammatory polyps of colon with rectal bleeding ([place])   . Leukocytosis    [name]  . Lung nodule 07/16/2011  . Myocardial infarction [facility])    age 57  . Restless leg syndrome   . Schatzki's ring   . Sleep apnea    does not use CPAP:cannot tolerate, PCP aware  . Syncope   . Tobacco abuse    50 pack years continuing at one halp pack daily  . Tubular adenoma of colon 06/2009   Colonosocpy Dr [name]    Family History  Problem Relation Age of Onset  . Lung disease Father        deceased, black  lung  . Heart disease Mother        blood clots  . Depression Mother   . Cancer Paternal Uncle        unknown type  . Cancer Maternal Aunt        unknown type  . Kidney failure Maternal Uncle   . Hypertension Brother   . Colon cancer Neg Hx   . ADD / ADHD Neg Hx   . Alcohol abuse Neg Hx   . Drug abuse Neg Hx   . Anxiety disorder Neg Hx   . Bipolar disorder Neg Hx   . Dementia Neg Hx   . OCD Neg Hx   . Paranoid behavior Neg Hx   . Schizophrenia Neg Hx   . Physical abuse Neg Hx   . Sexual abuse Neg Hx   . Seizures Neg Hx     Past Surgical History:  Procedure Laterality Date  . BIOPSY  12/19/2017   Procedure: BIOPSY;  Surgeon: [name], MD;  [facility];  Service: Endoscopy;;  gastric  . CARDIAC CATHETERIZATION    . CHOLECYSTECTOMY  2004  . COLONOSCOPY  06/2009   normal terminal ileum, segmental mild inflammation of sigmoid colon (bx unremarkable), polyp, tubular adenoma  . COLONOSCOPY N/A 07/31/2013   Dr.Rourk- redundant anal canal hemorrhoids, colonic diverticulosis, tubular adenoma  . COLONOSCOPY W/ POLYPECTOMY  2004   rectal polyp with carcinoma in situ removed via colonoscopy  . COLONOSCOPY WITH PROPOFOL N/A 09/15/2015   Dr. [name]: Scattered diverticula throughout the colon, 2 sessile polyps found in the descending colon  and cecum, 5 mm in size.  Cecal polyp was sessile serrated polyp, descending colon polyp was a tubular adenoma.  He had a abnormal perianal exam along with grade 3 hemorrhoids.  Surveillance exam recommended for 5-year follow-up.  . COLONOSCOPY WITH PROPOFOL N/A 12/19/2017   Procedure: COLONOSCOPY WITH PROPOFOL;  Surgeon: [name], MD;  [facility];  Service: Endoscopy;  Laterality: N/A;  7:30am  . CORONARY ANGIOPLASTY WITH STENT PLACEMENT  01/26/2012   "1; total is now 2"  . ESOPHAGOGASTRODUODENOSCOPY  02/2009   query Barrett's but bx negative  . ESOPHAGOGASTRODUODENOSCOPY  12/30/2010   [name],gastritis, dilated 72F, sm HH, 1 small ulcer, Duodenal erosions, benign bx  . ESOPHAGOGASTRODUODENOSCOPY (EGD) WITH ESOPHAGEAL DILATION N/A 09/12/2012   [id number] Schatzki's ring s/p Maloney dilator. Small hiatal hernia. negative path  . ESOPHAGOGASTRODUODENOSCOPY (EGD) WITH ESOPHAGEAL DILATION N/A 07/31/2013   Dr. [name]- normal egd, s/p [facility] dilation empirically. Normal small bowel biopsies   . ESOPHAGOGASTRODUODENOSCOPY (EGD) WITH PROPOFOL N/A 09/15/2015   Dr. [name]: Medium sized hiatal hernia, normal-appearing esophagus status post empiric dilation  . ESOPHAGOGASTRODUODENOSCOPY (EGD) WITH PROPOFOL N/A 12/19/2017   Procedure: ESOPHAGOGASTRODUODENOSCOPY (EGD) WITH PROPOFOL;  Surgeon: [name], MD;  [facility];  Service: Endoscopy;  Laterality: N/A;  . FLEXIBLE SIGMOIDOSCOPY  12/30/2010    [name],; internal hemorrhoids, anal papilla  . HAND SURGERY     surgical intervention for injury of the fingers of the left hand many years ago  . heart stent    . HEMORRHOID BANDING     Dr. [name]  . LEFT HEART CATHETERIZATION WITH CORONARY ANGIOGRAM N/A 01/26/2012   Procedure: LEFT HEART CATHETERIZATION WITH CORONARY ANGIOGRAM;  Surgeon: [name], MD;  Location: [facility] CATH LAB;  Service: Cardiovascular;  Laterality: N/A;  . MALONEY DILATION N/A 09/15/2015    Procedure: [name] DILATION;  Surgeon: [name],  MD;  [facility];  Service: Endoscopy;  Laterality: N/A;  . [name] DILATION N/A 12/19/2017   Procedure: [name] DILATION;  Surgeon: [name], MD;  [facility];  Service: Endoscopy;  Laterality: N/A;  . NASAL SEPTOPLASTY W/ TURBINOPLASTY  10/06/2011   Procedure: NASAL SEPTOPLASTY WITH TURBINATE REDUCTION;  Surgeon: Izora Gala, MD;  Location: [place];  Service: ENT;  Laterality: Bilateral;  . PERCUTANEOUS CORONARY STENT INTERVENTION (PCI-S) N/A 01/26/2012   Procedure: PERCUTANEOUS CORONARY STENT INTERVENTION (PCI-S);  Surgeon: [name], MD;  Location: [facility] CATH LAB;  Service: Cardiovascular;  Laterality: N/A;  . POLYPECTOMY  09/15/2015   Procedure: POLYPECTOMY;  Surgeon: [name], MD;  [facility];  Service: Endoscopy;;  Cecal polyp removed via cold snare/ Descending colon polyp removed via cold snare  . POLYPECTOMY  12/19/2017   Procedure: POLYPECTOMY;  Surgeon: [name], MD;  [facility];  Service: Endoscopy;;  colon   Social History   Occupational History  . Occupation: disable    Employer: RETIRED    Comment: [employer]  Tobacco Use  . Smoking status: Current Some Day Smoker    Packs/day: 0.50    Years: 30.00    Pack years: 15.00    Types: Cigarettes    Start date: 07/31/1969  . Smokeless tobacco: Former Systems developer  . Tobacco comment: cutting back trying to quit  Substance and Sexual Activity  . Alcohol use: Yes    Comment: Drinks a beer occasionally  . Drug use: No  . Sexual activity: Not Currently

## 2019-07-19 NOTE — Telephone Encounter (Signed)
Called pharm because 3 scripts were sent in on 1/7 and pharm states the problem is he does not have enough to fill the whole prescription. He could do a partial fill but then he would need another script to do the remainder or [facility] [place] [facility] enough to fill today. Called pt and explained that to him. He states he cannot go to [place] today because he has an appt and he is completely out and he will get the partial fill today.

## 2019-07-19 NOTE — Telephone Encounter (Signed)
May have 6 months on his medicine

## 2019-08-07 ENCOUNTER — Ambulatory Visit: Payer: Medicare PPO | Admitting: Gastroenterology

## 2019-08-07 ENCOUNTER — Other Ambulatory Visit: Payer: Self-pay

## 2019-08-07 ENCOUNTER — Encounter: Payer: Self-pay | Admitting: Gastroenterology

## 2019-08-07 VITALS — BP 159/94 | HR 79 | Temp 97.7°F | Ht 70.0 in | Wt 183.8 lb

## 2019-08-07 DIAGNOSIS — K529 Noninfective gastroenteritis and colitis, unspecified: Secondary | ICD-10-CM

## 2019-08-07 DIAGNOSIS — R1310 Dysphagia, unspecified: Secondary | ICD-10-CM | POA: Diagnosis not present

## 2019-08-07 MED ORDER — TRELEGY ELLIPTA 100-62.5-25 MCG/INH IN AEPB: 1.0000 | INHALATION_SPRAY | Freq: Every day | RESPIRATORY_TRACT | 1 refills | Status: DC | PRN

## 2019-08-07 NOTE — Patient Instructions (Signed)
PA for EGD submitted via [employer] website. Case approved. [employer]# [id number], valid 09/17/19-10/17/19.

## 2019-08-07 NOTE — Patient Instructions (Signed)
We are scheduling an upper endoscopy with dilation by Dr. [name] in the near future.  Continue Creon with meals and with snacks if needed.   We will see you in 6 months!  I enjoyed seeing you again today! As you know, I value our relationship and want to provide genuine, compassionate, and quality care. I welcome your feedback. If you receive a survey regarding your visit,  I greatly appreciate you taking time to fill this out. See you next time!  Annitta Needs, PhD, ANP-BC [facility]

## 2019-08-07 NOTE — Progress Notes (Signed)
Cc'ed to pcp °

## 2019-08-07 NOTE — Progress Notes (Signed)
Referring Provider: [name], MD Primary Care Physician:  [name], MD Primary GI: Dr. [name]   Chief Complaint  Patient presents with  . Hemorrhoids    doing ok    HPI:   [name] is a 64 y.o. male presenting today with a history of tubular adenomas and surveillance due in 2024, internal hemorrhoids s/p banding in 2015 and then most recently late last year with excellent results, erosive esophagitis, and dysphagia s/p dilation in 2019. Chronic diarrhea has responded quite well to Creon empirically.    Creon has helped tremendously. Now only 2 BMs a day. Previously 5-6 per day and with postprandial urgency. Taking Creon three times a day. Rare snacks. If snacking, will take one capsule. No abdominal pain. Appetite is good. Protonix now mainly once a day, 30 minutes prior to breakfast. Notes solid food dysphagia for past few months. Chews food well but still doesn't want to go down. No pill dysphagia. Takes a few at a time. Did well after dilation in the past.    Past Medical History:  Diagnosis Date  . Anxiety   . Anxiety and depression   . ASCVD (arteriosclerotic cardiovascular disease)    inferior MI with circumflex PTCA in 1993;cath in 2000-50% OM 1&2,30% LAD ;2008-25% LAD, 80% nondominant right ,60% small OM 2,EF of 55% with severe basilar inferior hypokinesis; stress nuclear study in 04/2008 moderate inferior scar with peri-infarction ischemic  . Asthma   . CAD (coronary artery disease)   . Carcinoma in situ of colon 2004   rectal polyp  . Colitis, ischemic ([place]) 2011  . COPD (chronic obstructive pulmonary disease) (HCC)    mild ;excercise induced hypoxemia by cp stress test ;asthma ,bronchitis,  . Depression   . Diverticulosis   . Gastritis 12/30/10   EGD Dr [name]  . GERD (gastroesophageal reflux disease)   . H. pylori infection 2004   treated  . Hemorrhoids   . Hiatal hernia   . Hyperlipidemia   . Hypertension   . Inflammatory polyps of colon  with rectal bleeding ([place])   . Leukocytosis    [name]  . Lung nodule 07/16/2011  . Myocardial infarction [facility])    age 41  . Restless leg syndrome   . Schatzki's ring   . Sleep apnea    does not use CPAP:cannot tolerate, PCP aware  . Syncope   . Tobacco abuse    50 pack years continuing at one halp pack daily  . Tubular adenoma of colon 06/2009   Colonosocpy Dr [name]    Past Surgical History:  Procedure Laterality Date  . BIOPSY  12/19/2017   Procedure: BIOPSY;  Surgeon: [name], MD;  [facility];  Service: Endoscopy;;  gastric  . CARDIAC CATHETERIZATION    . CHOLECYSTECTOMY  2004  . COLONOSCOPY  06/2009   normal terminal ileum, segmental mild inflammation of sigmoid colon (bx unremarkable), polyp, tubular adenoma  . COLONOSCOPY N/A 07/31/2013   Dr.Rourk- redundant anal canal hemorrhoids, colonic diverticulosis, tubular adenoma  . COLONOSCOPY W/ POLYPECTOMY  2004   rectal polyp with carcinoma in situ removed via colonoscopy  . COLONOSCOPY WITH PROPOFOL N/A 09/15/2015   Dr. [name]: Scattered diverticula throughout the colon, 2 sessile polyps found in the descending colon and cecum, 5 mm in size.  Cecal polyp was sessile serrated polyp, descending colon polyp was a tubular adenoma.  He had a abnormal perianal exam along with grade 3 hemorrhoids.  Surveillance  exam recommended for 5-year follow-up.  . COLONOSCOPY WITH PROPOFOL N/A 12/19/2017   one 5 mm polyp in ascending colon and 1 cm sessile polyp in ascending s/p removal. Tubular adenoma. internal hemorrhoids  . CORONARY ANGIOPLASTY WITH STENT PLACEMENT  01/26/2012   "1; total is now 2"  . ESOPHAGOGASTRODUODENOSCOPY  02/2009   query Barrett's but bx negative  . ESOPHAGOGASTRODUODENOSCOPY  12/30/2010   [name],gastritis, dilated 28F, sm HH, 1 small ulcer, Duodenal erosions, benign bx  . ESOPHAGOGASTRODUODENOSCOPY (EGD) WITH ESOPHAGEAL DILATION N/A 09/12/2012   [id number] Schatzki's ring s/p Maloney  dilator. Small hiatal hernia. negative path  . ESOPHAGOGASTRODUODENOSCOPY (EGD) WITH ESOPHAGEAL DILATION N/A 07/31/2013   Dr. [name]- normal egd, s/p [facility] dilation empirically. Normal small bowel biopsies   . ESOPHAGOGASTRODUODENOSCOPY (EGD) WITH PROPOFOL N/A 09/15/2015   Dr. [name]: Medium sized hiatal hernia, normal-appearing esophagus status post empiric dilation  . ESOPHAGOGASTRODUODENOSCOPY (EGD) WITH PROPOFOL N/A 12/19/2017   erosive esophagitis s/p dilation, erosive gastropathy, normal duodenum  . FLEXIBLE SIGMOIDOSCOPY  12/30/2010    [name],; internal hemorrhoids, anal papilla  . HAND SURGERY     surgical intervention for injury of the fingers of the left hand many years ago  . heart stent    . HEMORRHOID BANDING     Dr. [name]  . LEFT HEART CATHETERIZATION WITH CORONARY ANGIOGRAM N/A 01/26/2012   Procedure: LEFT HEART CATHETERIZATION WITH CORONARY ANGIOGRAM;  Surgeon: [name], MD;  Location: [facility] CATH LAB;  Service: Cardiovascular;  Laterality: N/A;  . [name] DILATION N/A 09/15/2015   Procedure: [name] DILATION;  Surgeon: [name], MD;  [facility];  Service: Endoscopy;  Laterality: N/A;  . [name] DILATION N/A 12/19/2017   Procedure: [name] DILATION;  Surgeon: [name], MD;  [facility];  Service: Endoscopy;  Laterality: N/A;  . NASAL SEPTOPLASTY W/ TURBINOPLASTY  10/06/2011   Procedure: NASAL SEPTOPLASTY WITH TURBINATE REDUCTION;  Surgeon: Izora Gala, MD;  Location: [facility];  Service: ENT;  Laterality: Bilateral;  . PERCUTANEOUS CORONARY STENT INTERVENTION (PCI-S) N/A 01/26/2012   Procedure: PERCUTANEOUS CORONARY STENT INTERVENTION (PCI-S);  Surgeon: [name], MD;  Location: [facility] CATH LAB;  Service: Cardiovascular;  Laterality: N/A;  . POLYPECTOMY  09/15/2015   Procedure: POLYPECTOMY;  Surgeon: [name], MD;  [facility];  Service: Endoscopy;;  Cecal polyp removed via cold snare/ Descending colon polyp removed via cold  snare  . POLYPECTOMY  12/19/2017   Procedure: POLYPECTOMY;  Surgeon: [name], MD;  [facility];  Service: Endoscopy;;  colon    Current Outpatient Medications  Medication Sig Dispense Refill  . albuterol (PROVENTIL) (2.5 MG/3ML) 0.083% nebulizer solution Take 2.5 mg by nebulization every 6 (six) hours as needed for wheezing or shortness of breath.     . ALPRAZolam (XANAX) 1 MG tablet TAKE 1 TABLET (1 MG TOTAL) BY MOUTH 4 (FOUR) TIMES DAILY. 120 tablet 2  . aspirin EC 81 MG tablet Take 81 mg by mouth daily.    Marland Kitchen atorvastatin (LIPITOR) 80 MG tablet TAKE 1 TABLET BY MOUTH EVERY DAY 90 tablet 3  . budesonide-formoterol (SYMBICORT) 80-4.5 MCG/ACT inhaler Inhale 2 puffs into the lungs 2 (two) times daily. 1 Inhaler 12  . FLUoxetine (PROZAC) 20 MG capsule Take 1 capsule (20 mg total) by mouth daily. 90 capsule 2  . FLUoxetine (PROZAC) 40 MG capsule TAKE 1 CAPSULE BY MOUTH EVERY DAY 90 capsule 2  . fluticasone (FLONASE) 50 MCG/ACT nasal spray  Place 2 sprays into both nostrils daily. (Patient taking differently: Place 2 sprays into both nostrils daily as needed for allergies. ) 16 g 5  . gabapentin (NEURONTIN) 300 MG capsule Take 2 capsules (600 mg total) by mouth 3 (three) times daily. 540 capsule 1  . HYDROcodone-acetaminophen (NORCO) 7.5-325 MG tablet Take 1 tablet by mouth 2 (two) times daily as needed for moderate pain. 60 tablet 0  . hydrocortisone (ANUSOL-HC) 2.5 % rectal cream Place 1 application rectally 2 (two) times daily. 30 g 1  . lipase/protease/amylase (CREON) 36000 UNITS CPEP capsule Take 2 capsules (72,000 Units total) by mouth 3 (three) times daily with meals. 1 with snacks 240 capsule 3  . lisinopril (ZESTRIL) 2.5 MG tablet TAKE 1 TABLET (2.5 MG TOTAL) BY MOUTH DAILY. **STOP VERAPAMIL** 90 tablet 1  . meloxicam (MOBIC) 15 MG tablet TAKE 1 TABLET BY MOUTH EVERY DAY 30 tablet 5  . metoprolol succinate (TOPROL-XL) 50 MG 24 hr tablet Take 1 tablet (50 mg total) by mouth  daily. 90 tablet 3  . nitroGLYCERIN (NITROSTAT) 0.4 MG SL tablet Place 0.4 mg under the tongue every 5 (five) minutes as needed for chest pain.    Marland Kitchen OLANZapine (ZYPREXA) 10 MG tablet Take 1 tablet (10 mg total) by mouth at bedtime. 90 tablet 2  . pantoprazole (PROTONIX) 40 MG tablet Take 1 tablet (40 mg total) by mouth 2 (two) times daily before a meal. 180 tablet 3  . rOPINIRole (REQUIP) 2 MG tablet TAKE 1 TABLET BY MOUTH DAILY AT BEDTIME 90 tablet 2  . temazepam (RESTORIL) 30 MG capsule Take 1 capsule (30 mg total) by mouth at bedtime as needed for sleep. 30 capsule 2  . TRELEGY ELLIPTA 100-62.5-25 MCG/INH AEPB INHALE ONE PUFF ONCE A DAY     No current facility-administered medications for this visit.    Allergies as of 08/07/2019  . (No Known Allergies)    Family History  Problem Relation Age of Onset  . Lung disease Father        deceased, black lung  . Heart disease Mother        blood clots  . Depression Mother   . Cancer Paternal Uncle        unknown type  . Cancer Maternal Aunt        unknown type  . Kidney failure Maternal Uncle   . Hypertension Brother   . Colon cancer Neg Hx   . ADD / ADHD Neg Hx   . Alcohol abuse Neg Hx   . Drug abuse Neg Hx   . Anxiety disorder Neg Hx   . Bipolar disorder Neg Hx   . Dementia Neg Hx   . OCD Neg Hx   . Paranoid behavior Neg Hx   . Schizophrenia Neg Hx   . Physical abuse Neg Hx   . Sexual abuse Neg Hx   . Seizures Neg Hx     Social History   Socioeconomic History  . Marital status: Married    Spouse name: Not on file  . Number of children: 3  . Years of education: Not on file  . Highest education level: Not on file  Occupational History  . Occupation: disable    Employer: RETIRED    Comment: [employer]  Tobacco Use  . Smoking status: Current Some Day Smoker    Packs/day: 0.50    Years: 30.00    Pack years: 15.00    Types: Cigarettes    Start date: 07/31/1969  .  Smokeless tobacco: Former Systems developer  . Tobacco comment: cutting  back trying to quit  Substance and Sexual Activity  . Alcohol use: Yes    Comment: Drinks a beer occasionally  . Drug use: No  . Sexual activity: Not Currently  Other Topics Concern  . Not on file  Social History Narrative   3 stepchildren   Social Determinants of Health   Financial Resource Strain:   . Difficulty of Paying Living Expenses: Not on file  Food Insecurity:   . Worried About Charity fundraiser in the Last Year: Not on file  . Ran Out of Food in the Last Year: Not on file  Transportation Needs:   . Lack of Transportation (Medical): Not on file  . Lack of Transportation (Non-Medical): Not on file  Physical Activity:   . Days of Exercise per Week: Not on file  . Minutes of Exercise per Session: Not on file  Stress:   . Feeling of Stress : Not on file  Social Connections:   . Frequency of Communication with Friends and Family: Not on file  . Frequency of Social Gatherings with Friends and Family: Not on file  . Attends Religious Services: Not on file  . Active Member of Clubs or Organizations: Not on file  . Attends Archivist Meetings: Not on file  . Marital Status: Not on file    Review of Systems: Gen: Denies fever, chills, anorexia. Denies fatigue, weakness, weight loss.  CV: Denies chest pain, palpitations, syncope, peripheral edema, and claudication. Resp: +DOE GI: see HPI Derm: Denies rash, itching, dry skin Psych: Denies depression, anxiety, memory loss, confusion. No homicidal or suicidal ideation.  Heme: Denies bruising, bleeding, and enlarged lymph nodes.  Physical Exam: BP (!) 159/94   Pulse 79   Temp 97.7 F (36.5 C) (Temporal)   Ht 5\' 10"  (1.778 m)   Wt 183 lb 12.8 oz (83.4 kg)   BMI 26.37 kg/m  General:   Alert and oriented. No distress noted. Pleasant and cooperative.  Head:  Normocephalic and atraumatic. Eyes:  Conjuctiva clear without scleral icterus. Lungs: clear bilaterally Cardiac: S1 S2 present without  murmurs Abdomen:  +BS, soft, non-tender and non-distended. No rebound or guarding. No HSM or masses noted. Msk:  Symmetrical without gross deformities. Normal posture. Extremities:  Without edema. Neurologic:  Alert and  oriented x4 Psych:  Alert and cooperative. Normal mood and affect.  ASSESSMENT: [name] is a 64 y.o. male presenting today with a history of chronic diarrhea that has responded well to Creon, tubular adenomas, esophagitis, and dysphagia with last dilation in 2019. He has responded well to dilation historically. Now with recurrent solid dysphagia for the past few months. Weight has increased, good appetite, and he is doing well s/p banding most recently several months ago. Will pursue EGD/dilation in near future.    PLAN:   Proceed with upper endoscopy/dilation in the near future with Dr. [name] using PROPOFOL. The risks, benefits, and alternatives have been discussed in detail with patient. They have stated understanding and desire to proceed.   Continue PPI once daily.  Continue Creon  Surveillance colonoscopy in 2024  Return in 6 months  Annitta Needs, PhD, [facility]

## 2019-08-07 NOTE — Addendum Note (Signed)
Addended by: Annitta Needs on: 08/07/2019 08:58 AM   Modules accepted: Orders

## 2019-08-28 ENCOUNTER — Encounter (HOSPITAL_COMMUNITY): Payer: Self-pay

## 2019-08-28 ENCOUNTER — Other Ambulatory Visit: Payer: Self-pay

## 2019-08-28 ENCOUNTER — Emergency Department (HOSPITAL_COMMUNITY): Payer: Medicare PPO

## 2019-08-28 ENCOUNTER — Observation Stay (HOSPITAL_COMMUNITY)
Admission: EM | Admit: 2019-08-28 | Discharge: 2019-08-29 | Disposition: A | Discharge status: Home / Self Care | Payer: Medicare PPO | Attending: Cardiovascular Disease | Admitting: Cardiovascular Disease

## 2019-08-28 ENCOUNTER — Telehealth: Payer: Self-pay | Admitting: *Deleted

## 2019-08-28 DIAGNOSIS — K219 Gastro-esophageal reflux disease without esophagitis: Secondary | ICD-10-CM | POA: Insufficient documentation

## 2019-08-28 DIAGNOSIS — R079 Chest pain, unspecified: Secondary | ICD-10-CM | POA: Diagnosis not present

## 2019-08-28 DIAGNOSIS — Z7982 Long term (current) use of aspirin: Secondary | ICD-10-CM | POA: Insufficient documentation

## 2019-08-28 DIAGNOSIS — Z955 Presence of coronary angioplasty implant and graft: Secondary | ICD-10-CM | POA: Insufficient documentation

## 2019-08-28 DIAGNOSIS — F17210 Nicotine dependence, cigarettes, uncomplicated: Secondary | ICD-10-CM | POA: Diagnosis not present

## 2019-08-28 DIAGNOSIS — Z79899 Other long term (current) drug therapy: Secondary | ICD-10-CM | POA: Diagnosis not present

## 2019-08-28 DIAGNOSIS — G4733 Obstructive sleep apnea (adult) (pediatric): Secondary | ICD-10-CM | POA: Insufficient documentation

## 2019-08-28 DIAGNOSIS — Z20822 Contact with and (suspected) exposure to covid-19: Secondary | ICD-10-CM | POA: Diagnosis not present

## 2019-08-28 DIAGNOSIS — R0602 Shortness of breath: Secondary | ICD-10-CM | POA: Diagnosis not present

## 2019-08-28 DIAGNOSIS — R05 Cough: Secondary | ICD-10-CM | POA: Diagnosis not present

## 2019-08-28 DIAGNOSIS — I10 Essential (primary) hypertension: Secondary | ICD-10-CM | POA: Diagnosis not present

## 2019-08-28 DIAGNOSIS — J449 Chronic obstructive pulmonary disease, unspecified: Secondary | ICD-10-CM | POA: Insufficient documentation

## 2019-08-28 DIAGNOSIS — E785 Hyperlipidemia, unspecified: Secondary | ICD-10-CM | POA: Insufficient documentation

## 2019-08-28 DIAGNOSIS — I200 Unstable angina: Secondary | ICD-10-CM | POA: Diagnosis not present

## 2019-08-28 DIAGNOSIS — I252 Old myocardial infarction: Secondary | ICD-10-CM | POA: Insufficient documentation

## 2019-08-28 DIAGNOSIS — I25119 Atherosclerotic heart disease of native coronary artery with unspecified angina pectoris: Secondary | ICD-10-CM

## 2019-08-28 DIAGNOSIS — Z720 Tobacco use: Secondary | ICD-10-CM

## 2019-08-28 DIAGNOSIS — R509 Fever, unspecified: Secondary | ICD-10-CM | POA: Diagnosis not present

## 2019-08-28 DIAGNOSIS — I25110 Atherosclerotic heart disease of native coronary artery with unstable angina pectoris: Principal | ICD-10-CM | POA: Insufficient documentation

## 2019-08-28 LAB — BASIC METABOLIC PANEL
Anion gap: 8 (ref 5–15)
BUN: 12 mg/dL (ref 8–23)
CO2: 26 mmol/L (ref 22–32)
Calcium: 8.6 mg/dL — ABNORMAL LOW (ref 8.9–10.3)
Chloride: 108 mmol/L (ref 98–111)
Creatinine, Ser: 0.74 mg/dL (ref 0.61–1.24)
GFR calc Af Amer: >60 mL/min (ref >60)
GFR calc non Af Amer: >60 mL/min (ref >60)
Glucose, Bld: 105 mg/dL — ABNORMAL HIGH (ref 70–99)
Potassium: 3.7 mmol/L (ref 3.5–5.1)
Sodium: 142 mmol/L (ref 135–145)

## 2019-08-28 LAB — CBC
HCT: 41 % (ref 39.0–52.0)
Hemoglobin: 14.4 g/dL (ref 13.0–17.0)
MCH: 33.1 pg (ref 26.0–34.0)
MCHC: 35.1 g/dL (ref 30.0–36.0)
MCV: 94.3 fL (ref 80.0–100.0)
Platelets: 151 10*3/uL (ref 150–400)
RBC: 4.35 MIL/uL (ref 4.22–5.81)
RDW: 13.4 % (ref 11.5–15.5)
WBC: 6.5 10*3/uL (ref 4.0–10.5)
nRBC: 0 % (ref 0.0–0.2)

## 2019-08-28 LAB — TROPONIN I (HIGH SENSITIVITY)
Troponin I (High Sensitivity): 3 ng/L (ref <18)
Troponin I (High Sensitivity): 3 ng/L (ref <18)

## 2019-08-28 LAB — BRAIN NATRIURETIC PEPTIDE: B Natriuretic Peptide: 50 pg/mL (ref 0.0–100.0)

## 2019-08-28 LAB — APTT: aPTT: 29 seconds (ref 24–36)

## 2019-08-28 LAB — SARS CORONAVIRUS 2 (TAT 6-24 HRS): SARS Coronavirus 2: NEGATIVE

## 2019-08-28 MED ORDER — GABAPENTIN 300 MG PO CAPS
600.0000 mg | ORAL_CAPSULE | Freq: Three times a day (TID) | ORAL | Status: DC
  Administered 2019-08-28 – 2019-08-29 (×3): 600 mg via ORAL
  Filled 2019-08-28 (×3): qty 2

## 2019-08-28 MED ORDER — TEMAZEPAM 30 MG PO CAPS: 30.0000 mg | ORAL_CAPSULE | Freq: Every evening | ORAL | Status: DC | PRN

## 2019-08-28 MED ORDER — OLANZAPINE 10 MG PO TABS
10.0000 mg | ORAL_TABLET | Freq: Every day | ORAL | Status: DC
  Administered 2019-08-28: 10 mg via ORAL
  Filled 2019-08-28 (×2): qty 1

## 2019-08-28 MED ORDER — ATORVASTATIN CALCIUM 80 MG PO TABS
80.0000 mg | ORAL_TABLET | Freq: Every day | ORAL | Status: DC
  Administered 2019-08-29: 80 mg via ORAL
  Filled 2019-08-28: qty 1

## 2019-08-28 MED ORDER — HEPARIN BOLUS VIA INFUSION
4000.0000 [IU] | Freq: Once | INTRAVENOUS | Status: AC
  Administered 2019-08-28: 4000 [IU] via INTRAVENOUS

## 2019-08-28 MED ORDER — SODIUM CHLORIDE 0.9 % WEIGHT BASED INFUSION
3.0000 mL/kg/h | INTRAVENOUS | Status: DC
  Administered 2019-08-29: 3 mL/kg/h via INTRAVENOUS

## 2019-08-28 MED ORDER — MOMETASONE FURO-FORMOTEROL FUM 100-5 MCG/ACT IN AERO
2.0000 | INHALATION_SPRAY | Freq: Two times a day (BID) | RESPIRATORY_TRACT | Status: DC
  Administered 2019-08-28 – 2019-08-29 (×2): 2 via RESPIRATORY_TRACT
  Filled 2019-08-28: qty 8.8

## 2019-08-28 MED ORDER — PANTOPRAZOLE SODIUM 40 MG PO TBEC
40.0000 mg | DELAYED_RELEASE_TABLET | Freq: Two times a day (BID) | ORAL | Status: DC
  Administered 2019-08-29 (×2): 40 mg via ORAL
  Filled 2019-08-28 (×2): qty 1

## 2019-08-28 MED ORDER — DICYCLOMINE HCL 10 MG PO CAPS
10.0000 mg | ORAL_CAPSULE | Freq: Three times a day (TID) | ORAL | Status: DC
  Administered 2019-08-28 – 2019-08-29 (×3): 10 mg via ORAL
  Filled 2019-08-28 (×3): qty 1

## 2019-08-28 MED ORDER — FLUTICASONE FUROATE-VILANTEROL 100-25 MCG/INH IN AEPB
1.0000 | INHALATION_SPRAY | Freq: Every day | RESPIRATORY_TRACT | Status: DC | PRN
  Filled 2019-08-28: qty 28

## 2019-08-28 MED ORDER — UMECLIDINIUM BROMIDE 62.5 MCG/INH IN AEPB
1.0000 | INHALATION_SPRAY | Freq: Every day | RESPIRATORY_TRACT | Status: DC | PRN
  Filled 2019-08-28: qty 7

## 2019-08-28 MED ORDER — ACETAMINOPHEN 325 MG PO TABS
650.0000 mg | ORAL_TABLET | ORAL | Status: DC | PRN
  Administered 2019-08-28: 650 mg via ORAL
  Filled 2019-08-28: qty 2

## 2019-08-28 MED ORDER — HEPARIN (PORCINE) 25000 UT/250ML-% IV SOLN
1100.0000 [IU]/h | INTRAVENOUS | Status: DC
  Administered 2019-08-28: 1000 [IU]/h via INTRAVENOUS
  Filled 2019-08-28: qty 250

## 2019-08-28 MED ORDER — HYDROCODONE-ACETAMINOPHEN 7.5-325 MG PO TABS: 1.0000 | ORAL_TABLET | Freq: Two times a day (BID) | ORAL | Status: DC | PRN

## 2019-08-28 MED ORDER — METOPROLOL SUCCINATE ER 50 MG PO TB24
50.0000 mg | ORAL_TABLET | Freq: Every day | ORAL | Status: DC
  Administered 2019-08-29: 50 mg via ORAL
  Filled 2019-08-28: qty 1

## 2019-08-28 MED ORDER — FLUOXETINE HCL 20 MG PO CAPS
40.0000 mg | ORAL_CAPSULE | Freq: Every day | ORAL | Status: DC
  Administered 2019-08-29: 40 mg via ORAL
  Filled 2019-08-28: qty 2

## 2019-08-28 MED ORDER — FLUTICASONE-UMECLIDIN-VILANT 100-62.5-25 MCG/INH IN AEPB: 1.0000 | INHALATION_SPRAY | Freq: Every day | RESPIRATORY_TRACT | Status: DC | PRN

## 2019-08-28 MED ORDER — NITROGLYCERIN 0.4 MG SL SUBL
0.4000 mg | SUBLINGUAL_TABLET | SUBLINGUAL | Status: DC | PRN
  Administered 2019-08-28 (×3): 0.4 mg via SUBLINGUAL
  Filled 2019-08-28: qty 1

## 2019-08-28 MED ORDER — ASPIRIN 81 MG PO CHEW
81.0000 mg | CHEWABLE_TABLET | ORAL | Status: AC
  Administered 2019-08-29: 81 mg via ORAL
  Filled 2019-08-28: qty 1

## 2019-08-28 MED ORDER — SODIUM CHLORIDE 0.9% FLUSH
3.0000 mL | Freq: Two times a day (BID) | INTRAVENOUS | Status: DC
  Administered 2019-08-28 – 2019-08-29 (×2): 3 mL via INTRAVENOUS

## 2019-08-28 MED ORDER — SODIUM CHLORIDE 0.9 % WEIGHT BASED INFUSION: 1.0000 mL/kg/h | INTRAVENOUS | Status: DC

## 2019-08-28 MED ORDER — PANCRELIPASE (LIP-PROT-AMYL) 12000-38000 UNITS PO CPEP
72000.0000 [IU] | ORAL_CAPSULE | Freq: Three times a day (TID) | ORAL | Status: DC
  Filled 2019-08-28 (×2): qty 2

## 2019-08-28 MED ORDER — SODIUM CHLORIDE 0.9% FLUSH: 3.0000 mL | INTRAVENOUS | Status: DC | PRN

## 2019-08-28 MED ORDER — ALBUTEROL SULFATE (2.5 MG/3ML) 0.083% IN NEBU: 2.5000 mg | INHALATION_SOLUTION | Freq: Four times a day (QID) | RESPIRATORY_TRACT | Status: DC | PRN

## 2019-08-28 MED ORDER — ONDANSETRON HCL 4 MG/2ML IJ SOLN: 4.0000 mg | Freq: Four times a day (QID) | INTRAMUSCULAR | Status: DC | PRN

## 2019-08-28 MED ORDER — ASPIRIN EC 81 MG PO TBEC: 81.0000 mg | DELAYED_RELEASE_TABLET | Freq: Every day | ORAL | Status: DC

## 2019-08-28 MED ORDER — TEMAZEPAM 15 MG PO CAPS: 30.0000 mg | ORAL_CAPSULE | Freq: Every evening | ORAL | Status: DC | PRN

## 2019-08-28 MED ORDER — ASPIRIN 325 MG PO TABS
325.0000 mg | ORAL_TABLET | Freq: Once | ORAL | Status: AC
  Administered 2019-08-28: 325 mg via ORAL
  Filled 2019-08-28: qty 1

## 2019-08-28 MED ORDER — SODIUM CHLORIDE 0.9 % IV SOLN: 250.0000 mL | INTRAVENOUS | Status: DC | PRN

## 2019-08-28 MED ORDER — ALPRAZOLAM 0.5 MG PO TABS: 1.0000 mg | ORAL_TABLET | Freq: Three times a day (TID) | ORAL | Status: DC | PRN

## 2019-08-28 NOTE — ED Triage Notes (Addendum)
Pt c/o chest pain and sob since Saturday.  Denies cough or fever.  Reports bp has been elevated since Friday.  Reports was 158/124.

## 2019-08-28 NOTE — ED Provider Notes (Signed)
[facility] EMERGENCY DEPARTMENT Provider Note   CSN: [id number] Arrival date & time: 08/28/19  1051     History Chief Complaint  Patient presents with  . Chest Pain    [name] is a 64 y.o. male.  [name] is a 64 y.o. male with history of CAD s/p stenting, COPD, hypertension, hyperlipidemia, GERD, who presents to the ED for evaluation of 5 days of intermittent chest pain or shortness of breath.  Patient reports when he starts to get up and walk around he begins to feel short of breath and will intermittently develop some central and left-sided chest pressure.  He reports he is also intermittently felt lightheaded but has not had any syncopal episodes or fallen.  Pain is well localized and nonradiating.  Pain is not pleuritic in nature.  He denies any associated cough or fever.  No associated headaches.  No abdominal pain, nausea, vomiting or diaphoresis.  He is followed by Dr. Harl Bowie with cardiology, last heart cath done in 2013, unable to see results but patient reports he has had 2 stents placed.  He is on daily aspirin, no other blood thinners, states that he thinks his blood pressure has been a bit high recently, but has been taking his medicines regularly. Last echo done in 2017 showed an EF of 50%.  Had a Myoview cardiac perfusion study done in March 2019 that did not show any evidence of new blockages.  Last cath was done in 2013 but I am unable to use a cath report.        Past Medical History:  Diagnosis Date  . Anxiety   . Anxiety and depression   . ASCVD (arteriosclerotic cardiovascular disease)    inferior MI with circumflex PTCA in 1993;cath in 2000-50% OM 1&2,30% LAD ;2008-25% LAD, 80% nondominant right ,60% small OM 2,EF of 55% with severe basilar inferior hypokinesis; stress nuclear study in 04/2008 moderate inferior scar with peri-infarction ischemic  . Asthma   . CAD (coronary artery disease)   . Carcinoma in situ of colon 2004   rectal polyp  .  Colitis, ischemic ([place]) 2011  . COPD (chronic obstructive pulmonary disease) (HCC)    mild ;excercise induced hypoxemia by cp stress test ;asthma ,bronchitis,  . Depression   . Diverticulosis   . Gastritis 12/30/10   EGD Dr [name]  . GERD (gastroesophageal reflux disease)   . GI bleed   . H. pylori infection 2004   treated  . Hemorrhoids   . Hiatal hernia   . Hyperlipidemia   . Hypertension   . Inflammatory polyps of colon with rectal bleeding ([place])   . Leukocytosis    [name]  . Lung nodule 07/16/2011  . Myocardial infarction [facility])    age 30  . Restless leg syndrome   . Schatzki's ring   . Sleep apnea    does not use CPAP:cannot tolerate, PCP aware  . Syncope   . Tobacco abuse    50 pack years continuing at one halp pack daily  . Tubular adenoma of colon 06/2009   Colonosocpy Dr [name]    Patient Active Problem List   Diagnosis Date Noted  . Complete tear of left rotator cuff 07/19/2019  . Grade III hemorrhoids 04/13/2019  . Grade II hemorrhoids 03/08/2019  . Diarrhea 02/08/2019  . Dysphagia   . Hiatal hernia   . History of colonic polyps   . Diverticulosis of colon without hemorrhage   . Hemorrhoids   .  Gastroparesis 11/20/2013  . Chronic pain syndrome 10/23/2013  . Nausea alone 07/23/2013  . Hyperglycemia 04/25/2013  . 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 anxiety 09/20/2012  . Pain in limb 09/20/2012  . Loose stools 09/07/2012  . Dyspepsia 09/05/2012  . Dysphagia, unspecified(787.20) 09/05/2012  . Gastroesophageal reflux disease 05/09/2012  . Lung nodule 07/16/2011  . RLS (restless legs syndrome) 03/09/2011  . Chronic diarrhea 12/21/2010  . Leukocytosis 12/21/2010  . TOBACCO ABUSE 09/23/2009  . ATHEROSCLEROTIC CARDIOVASCULAR DISEASE 09/23/2009  . Hyperlipidemia 10/01/2008  . Essential hypertension 10/01/2008  . Depression with anxiety 03/31/2007  . OBSTRUCTIVE SLEEP APNEA 03/31/2007  . Chronic obstructive pulmonary disease ([place]) 03/31/2007     Past Surgical History:  Procedure Laterality Date  . BIOPSY  12/19/2017   Procedure: BIOPSY;  Surgeon: [name], MD;  [facility];  Service: Endoscopy;;  gastric  . CARDIAC CATHETERIZATION    . CHOLECYSTECTOMY  2004  . COLONOSCOPY  06/2009   normal terminal ileum, segmental mild inflammation of sigmoid colon (bx unremarkable), polyp, tubular adenoma  . COLONOSCOPY N/A 07/31/2013   Dr.Rourk- redundant anal canal hemorrhoids, colonic diverticulosis, tubular adenoma  . COLONOSCOPY W/ POLYPECTOMY  2004   rectal polyp with carcinoma in situ removed via colonoscopy  . COLONOSCOPY WITH PROPOFOL N/A 09/15/2015   Dr. [name]: Scattered diverticula throughout the colon, 2 sessile polyps found in the descending colon and cecum, 5 mm in size.  Cecal polyp was sessile serrated polyp, descending colon polyp was a tubular adenoma.  He had a abnormal perianal exam along with grade 3 hemorrhoids.  Surveillance exam recommended for 5-year follow-up.  . COLONOSCOPY WITH PROPOFOL N/A 12/19/2017   one 5 mm polyp in ascending colon and 1 cm sessile polyp in ascending s/p removal. Tubular adenoma. internal hemorrhoids  . CORONARY ANGIOPLASTY WITH STENT PLACEMENT  01/26/2012   "1; total is now 2"  . ESOPHAGOGASTRODUODENOSCOPY  02/2009   query Barrett's but bx negative  . ESOPHAGOGASTRODUODENOSCOPY  12/30/2010   [name],gastritis, dilated 47F, sm HH, 1 small ulcer, Duodenal erosions, benign bx  . ESOPHAGOGASTRODUODENOSCOPY (EGD) WITH ESOPHAGEAL DILATION N/A 09/12/2012   [id number] Schatzki's ring s/p Maloney dilator. Small hiatal hernia. negative path  . ESOPHAGOGASTRODUODENOSCOPY (EGD) WITH ESOPHAGEAL DILATION N/A 07/31/2013   Dr. [name]- normal egd, s/p [facility] dilation empirically. Normal small bowel biopsies   . ESOPHAGOGASTRODUODENOSCOPY (EGD) WITH PROPOFOL N/A 09/15/2015   Dr. [name]: Medium sized hiatal hernia, normal-appearing esophagus status post empiric dilation  .  ESOPHAGOGASTRODUODENOSCOPY (EGD) WITH PROPOFOL N/A 12/19/2017   erosive esophagitis s/p dilation, erosive gastropathy, normal duodenum  . FLEXIBLE SIGMOIDOSCOPY  12/30/2010    [name],; internal hemorrhoids, anal papilla  . HAND SURGERY     surgical intervention for injury of the fingers of the left hand many years ago  . heart stent    . HEMORRHOID BANDING     Dr. [name]  . LEFT HEART CATHETERIZATION WITH CORONARY ANGIOGRAM N/A 01/26/2012   Procedure: LEFT HEART CATHETERIZATION WITH CORONARY ANGIOGRAM;  Surgeon: [name], MD;  Location: [facility] CATH LAB;  Service: Cardiovascular;  Laterality: N/A;  . [name] DILATION N/A 09/15/2015   Procedure: [name] DILATION;  Surgeon: [name], MD;  [facility];  Service: Endoscopy;  Laterality: N/A;  . [name] DILATION N/A 12/19/2017   Procedure: [name] DILATION;  Surgeon: [name], MD;  [facility];  Service: Endoscopy;  Laterality: N/A;  . NASAL SEPTOPLASTY W/ TURBINOPLASTY  10/06/2011   Procedure: NASAL  SEPTOPLASTY WITH TURBINATE REDUCTION;  Surgeon: Izora Gala, MD;  Location: [place];  Service: ENT;  Laterality: Bilateral;  . PERCUTANEOUS CORONARY STENT INTERVENTION (PCI-S) N/A 01/26/2012   Procedure: PERCUTANEOUS CORONARY STENT INTERVENTION (PCI-S);  Surgeon: [name], MD;  Location[facility] CATH LAB;  Service: Cardiovascular;  Laterality: N/A;  . POLYPECTOMY  09/15/2015   Procedure: POLYPECTOMY;  Surgeon: [name], MD;  [facility];  Service: Endoscopy;;  Cecal polyp removed via cold snare/ Descending colon polyp removed via cold snare  . POLYPECTOMY  12/19/2017   Procedure: POLYPECTOMY;  Surgeon: [name], MD;  [facility];  Service: Endoscopy;;  colon       Family History  Problem Relation Age of Onset  . Lung disease Father        deceased, black lung  . Heart disease Mother        blood clots  . Depression Mother   . Cancer Paternal Uncle        unknown type  .  Cancer Maternal Aunt        unknown type  . Kidney failure Maternal Uncle   . Hypertension Brother   . Colon cancer Neg Hx   . ADD / ADHD Neg Hx   . Alcohol abuse Neg Hx   . Drug abuse Neg Hx   . Anxiety disorder Neg Hx   . Bipolar disorder Neg Hx   . Dementia Neg Hx   . OCD Neg Hx   . Paranoid behavior Neg Hx   . Schizophrenia Neg Hx   . Physical abuse Neg Hx   . Sexual abuse Neg Hx   . Seizures Neg Hx     Social History   Tobacco Use  . Smoking status: Current Some Day Smoker    Packs/day: 0.50    Years: 30.00    Pack years: 15.00    Types: Cigarettes    Start date: 07/31/1969  . Smokeless tobacco: Former Systems developer  . Tobacco comment: cutting back trying to quit  Substance Use Topics  . Alcohol use: Yes    Comment: Drinks a beer occasionally  . Drug use: No    Home Medications Prior to Admission medications   Medication Sig Start Date End Date Taking? Authorizing Provider  albuterol (PROVENTIL) (2.5 MG/3ML) 0.083% nebulizer solution Take 2.5 mg by nebulization every 6 (six) hours as needed for wheezing or shortness of breath.     [provider]  ALPRAZolam (XANAX) 1 MG tablet TAKE 1 TABLET (1 MG TOTAL) BY MOUTH 4 (FOUR) TIMES DAILY. 05/28/19   Cloria Spring, MD  aspirin EC 81 MG tablet Take 81 mg by mouth daily.    [provider]  atorvastatin (LIPITOR) 80 MG tablet TAKE 1 TABLET BY MOUTH EVERY DAY 05/01/19   Arnoldo Lenis, MD  budesonide-formoterol [facility]) 80-4.5 MCG/ACT inhaler Inhale 2 puffs into the lungs 2 (two) times daily. 07/22/16   [name], MD  dicyclomine (BENTYL) 10 MG capsule Take 10 mg by mouth 4 (four) times daily. 08/11/19   [provider]  FLUoxetine (PROZAC) 20 MG capsule Take 1 capsule (20 mg total) by mouth daily. 01/26/19   Cloria Spring, MD  FLUoxetine (PROZAC) 40 MG capsule TAKE 1 CAPSULE BY MOUTH EVERY DAY 06/19/19   Cloria Spring, MD  fluticasone [facility]) 50 MCG/ACT nasal spray Place 2 sprays into both  nostrils daily. Patient taking differently: Place 2 sprays into both nostrils daily as needed  for allergies.  10/19/16   [name], MD  gabapentin (NEURONTIN) 300 MG capsule Take 2 capsules (600 mg total) by mouth 3 (three) times daily. 01/26/19   Cloria Spring, MD  HYDROcodone-acetaminophen (NORCO) 7.5-325 MG tablet Take 1 tablet by mouth 2 (two) times daily as needed for moderate pain. 06/21/19   [name], MD  hydrocortisone (ANUSOL-HC) 2.5 % rectal cream Place 1 application rectally 2 (two) times daily. 05/04/19   Annitta Needs, NP  lipase/protease/amylase (CREON) 36000 UNITS CPEP capsule Take 2 capsules (72,000 Units total) by mouth 3 (three) times daily with meals. 1 with snacks 05/04/19   Annitta Needs, NP  lisinopril (ZESTRIL) 2.5 MG tablet TAKE 1 TABLET (2.5 MG TOTAL) BY MOUTH DAILY. **STOP VERAPAMIL** 07/19/19   [name], MD  meloxicam (MOBIC) 15 MG tablet TAKE 1 TABLET BY MOUTH EVERY DAY 07/19/19   [name], MD  metoprolol succinate (TOPROL-XL) 50 MG 24 hr tablet Take 1 tablet (50 mg total) by mouth daily. 11/20/18   Strader, Fransisco Hertz, PA-C  nitroGLYCERIN (NITROSTAT) 0.4 MG SL tablet Place 0.4 mg under the tongue every 5 (five) minutes as needed for chest pain.    [provider]  OLANZapine (ZYPREXA) 10 MG tablet Take 1 tablet (10 mg total) by mouth at bedtime. 01/26/19 01/26/20  Cloria Spring, MD  pantoprazole (PROTONIX) 40 MG tablet Take 1 tablet (40 mg total) by mouth 2 (two) times daily before a meal. 04/20/19   Carlis Stable, NP  rOPINIRole (REQUIP) 2 MG tablet TAKE 1 TABLET BY MOUTH DAILY AT BEDTIME 05/14/19   Luking, Scott A, MD  temazepam (RESTORIL) 30 MG capsule Take 1 capsule (30 mg total) by mouth at bedtime as needed for sleep. 01/26/19   Cloria Spring, MD  TRELEGY ELLIPTA 100-62.5-25 MCG/INH AEPB Inhale 1 puff into the lungs daily as needed. 08/07/19   Annitta Needs, NP  amLODipine (NORVASC) 5 MG tablet Take 5 mg by mouth daily.   04/13/19  [provider]    Allergies    Patient has no known allergies.  Review of Systems   Review of Systems  Constitutional: Negative for chills and fever.  HENT: Negative.   Respiratory: Positive for shortness of breath. Negative for cough and wheezing.   Cardiovascular: Positive for chest pain. Negative for palpitations and leg swelling.  Gastrointestinal: Negative for abdominal pain, nausea and vomiting.  Genitourinary: Negative for dysuria and frequency.  Musculoskeletal: Negative for arthralgias and myalgias.  Skin: Negative for color change and rash.  Neurological: Positive for light-headedness. Negative for dizziness, syncope, weakness, numbness and headaches.  All other systems reviewed and are negative.   Physical Exam Updated Vital Signs BP 136/85   Pulse 77   Temp 98.5 F (36.9 C) (Oral)   Resp 14   Ht 5\' 10"  (1.778 m)   Wt 82.6 kg   SpO2 94%   BMI 26.11 kg/m   Physical Exam Vitals and nursing note reviewed.  Constitutional:      General: He is not in acute distress.    Appearance: He is well-developed and normal weight. He is not ill-appearing or diaphoretic.  HENT:     Head: Normocephalic and atraumatic.  Eyes:     General:        Right eye: No discharge.        Left eye: No discharge.     Pupils: Pupils are equal, round, and reactive to light.  Cardiovascular:     Rate and Rhythm: Normal rate and regular rhythm.     Pulses:          Radial pulses are 2+ on the right side and 2+ on the left side.     Heart sounds: Normal heart sounds. No murmur. No friction rub. No gallop.   Pulmonary:     Effort: Pulmonary effort is normal. No respiratory distress.     Breath sounds: Normal breath sounds. No wheezing or rales.     Comments: Respirations equal and unlabored, patient able to speak in full sentences, lungs clear to auscultation bilaterally Chest:     Chest wall: No tenderness.  Abdominal:     General: Bowel sounds are normal. There is no distension.      Palpations: Abdomen is soft. There is no mass.     Tenderness: There is no abdominal tenderness. There is no guarding.     Comments: Abdomen soft, nondistended, nontender to palpation in all quadrants without guarding or peritoneal signs  Musculoskeletal:        General: No deformity.     Cervical back: Neck supple.     Right lower leg: No tenderness. No edema.     Left lower leg: No tenderness. No edema.     Comments: Bilateral lower extremities warm and well perfused without edema  Skin:    General: Skin is warm and dry.     Capillary Refill: Capillary refill takes less than 2 seconds.  Neurological:     Mental Status: He is alert.     Coordination: Coordination normal.     Comments: Speech is clear, able to follow commands Moves extremities without ataxia, coordination intact  Psychiatric:        Mood and Affect: Mood normal.        Behavior: Behavior normal.     ED Results / Procedures / Treatments   Labs (all labs ordered are listed, but only abnormal results are displayed) Labs Reviewed  BASIC METABOLIC PANEL - Abnormal; Notable for the following components:      Result Value   Glucose, Bld 105 (*)    Calcium 8.6 (*)    All other components within normal limits  CBC  BRAIN NATRIURETIC PEPTIDE  TROPONIN I (HIGH SENSITIVITY)  TROPONIN I (HIGH SENSITIVITY)    EKG EKG Interpretation  Date/Time:  Tuesday August 28 2019 11:05:25 EDT Ventricular Rate:  76 PR Interval:    QRS Duration: 107 QT Interval:  420 QTC Calculation: 473 R Axis:   55 Text Interpretation: Sinus rhythm [id number] is noted No acute changes No significant change since last tracing Confirmed by Varney Biles [id number]) on 08/28/2019 1:58:25 PM   Radiology DG Chest Portable 1 View  Result Date: 08/28/2019 CLINICAL DATA:  Chest pain. Additional history provided: Patient reports chest pain and shortness of breath since Saturday, denies cough or fever, history of asthma and COPD, history of MI and cardiac  stent. EXAM: PORTABLE CHEST 1 VIEW COMPARISON:  Chest radiograph 04/03/2018 FINDINGS: Heart size within normal limits. Aortic atherosclerosis. No evidence of airspace consolidation within the lungs. No pleural effusion or pneumothorax. No acute bony abnormality. IMPRESSION: No evidence of acute cardiopulmonary abnormality. Aortic atherosclerosis. Electronically Signed   By: Kellie Simmering DO   On: 08/28/2019 12:19    Procedures .Critical Care Performed by: Jacqlyn Larsen, PA-C Authorized by: Jacqlyn Larsen, PA-C   Critical care provider statement:    Critical care time (minutes):  45  Critical care was necessary to treat or prevent imminent or life-threatening deterioration of the following conditions:  Cardiac failure and circulatory failure (Unstable angina)   Critical care was time spent personally by me on the following activities:  Blood draw for specimens, development of treatment plan with patient or surrogate, discussions with consultants, evaluation of patient's response to treatment, examination of patient, obtaining history from patient or surrogate, ordering and performing treatments and interventions, ordering and review of laboratory studies, ordering and review of radiographic studies, pulse oximetry, re-evaluation of patient's condition and review of old charts   (including critical care time)  Medications Ordered in ED Medications  aspirin tablet 325 mg (325 mg Oral Given 08/28/19 1509)    ED Course  I have reviewed the triage vital signs and the nursing notes.  Pertinent labs & imaging results that were available during my care of the patient were reviewed by me and considered in my medical decision making (see chart for details).   MDM Rules/Calculators/A&P                     64 year old male presents with 5 days of intermittent chest pain and shortness of breath that is present with exertion.  He states symptoms are worsening and are now present with lesser exertion and  he has had near syncopal symptoms as well.  Pt is followed by Dr. Harl Bowie with cardiology, had last cardiac cath in 2013 when he was having an episode of unstable angina and had a stent placed in the RCA, had Myoview perfusion scan 2 years ago that did not show progression of disease.  Patient does not have active chest pain currently and EKG without ischemic changes but will get cardiac labs, BNP, chest x-ray.  Patient with negative troponins x2, no leukocytosis, stable hemoglobin, glucose of 105, no other electrolyte derangements, normal renal function.  BNP is not elevated.  Chest x-ray without evidence of pneumonia or pulmonary edema.  Will discuss with cardiology.  Case discussed with Dr. Bronson Ing given concern for unstable angina.  Dr. Bronson Ing will be in to see the patient, for right now he thinks he is stable to remain at [facility].  After evaluation in the ED Dr. Bronson Ing think the patient will likely ultimately need to be transferred to [facility] for cardiac cath in the setting of unstable angina he will speak to his cardiology colleagues at [facility], patient will be started on heparin. May try to transferr today versus tomorrow.  Cardiology will admit here with plans for transfer to [facility] tomorrow for cath.   Final Clinical Impression(s) / ED Diagnoses Final diagnoses:  Unstable angina [facility])    Rx / DC Orders ED Discharge Orders    None       Jacqlyn Larsen, [place] 08/28/19 Susank, Ankit, MD 08/28/19 [phone]

## 2019-08-28 NOTE — Consult Note (Addendum)
Cardiology History & Physical:   Patient ID: KWANZA BRISKI MRN: [id number]; DOB: July 07, 1955  Admit date: 08/28/2019 Date of Admission: 08/28/2019  Primary Care Provider: [name], MD Primary Cardiologist: Carlyle Dolly, MD  Primary Electrophysiologist:  None    Patient Profile:   [name] is a 64 y.o. male with a hx of coronary artery disease who is being seen today for the evaluation of chest pain and shortness of breath at the request of Benedetto Goad PA-C.  History of Present Illness:   Mr. Sulcer is a 64 year old male with a history of coronary artery disease.  He was last evaluated by Dr. Harl Bowie on 10/03/2018 via a telehealth visit.  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   He had unstable angina in August 2013 and underwent PCI to the RCA using a bare-metal stent on 01/26/2012. Most recent echocardiogram in October 2017 demonstrated low normal LV systolic function, EF [id number].  Most recent nuclear stress test in March 2019 demonstrated inferior and inferolateral infarct with no ischemia, LVEF 45%.  Past medical history also includes hyperlipidemia, hypertension, COPD, obstructive sleep apnea, and GERD.  He presented to the ED this morning complaining of chest pain and shortness of breath over the past 3 days.  His blood pressure has been elevated since last Friday with a reading of 158/124.  Upon speaking with him further, he tells me that over the past 2 or 3 weeks he has been getting more more short of breath when walking shorter distances.  Over the past few days when he walks to his mailbox there are times where he felt like he was going to pass out.  He denies exertional chest pain per se.  He does have intermittent retrosternal chest aching sensations.  He does not recall what his prior anginal symptoms were.  He has some wheezing when he wakes up in the morning.  He denies fever and chills as well as leg swelling, orthopnea, and  paroxysmal nocturnal dyspnea.  When he begins exerting himself his heart starts racing.  He denies frank syncope.  He and his wife are raising their 85-year-old granddaughter and 57-year-old grandson as his daughter committed suicide 2 years ago.  When he has been trying to play with them and chase them around the yard, he has noticed progressive exertional dyspnea over the last 2 to 3 weeks.  Troponins and BNP are normal.  CBC is normal.  Renal function is normal.  Chest x-ray today shows no acute cardiopulmonary abnormalities.  He smokes about 1/2 pack of cigarettes daily.   Past Medical History:  Diagnosis Date  . Anxiety   . Anxiety and depression   . ASCVD (arteriosclerotic cardiovascular disease)    inferior MI with circumflex PTCA in 1993;cath in 2000-50% OM 1&2,30% LAD ;2008-25% LAD, 80% nondominant right ,60% small OM 2,EF of 55% with severe basilar inferior hypokinesis; stress nuclear study in 04/2008 moderate inferior scar with peri-infarction ischemic  . Asthma   . CAD (coronary artery disease)   . Carcinoma in situ of colon 2004   rectal polyp  . Colitis, ischemic ([place]) 2011  . COPD (chronic obstructive pulmonary disease) (HCC)    mild ;excercise induced hypoxemia by cp stress test ;asthma ,bronchitis,  . Depression   . Diverticulosis   . Gastritis 12/30/10   EGD Dr [name]  . GERD (gastroesophageal reflux disease)   . GI bleed   . H. pylori infection 2004  treated  . Hemorrhoids   . Hiatal hernia   . Hyperlipidemia   . Hypertension   . Inflammatory polyps of colon with rectal bleeding ([place])   . Leukocytosis    [name]  . Lung nodule 07/16/2011  . Myocardial infarction [facility])    age 18  . Restless leg syndrome   . Schatzki's ring   . Sleep apnea    does not use CPAP:cannot tolerate, PCP aware  . Syncope   . Tobacco abuse    50 pack years continuing at one halp pack daily  . Tubular adenoma of colon 06/2009   Colonosocpy Dr [name]    Past Surgical  History:  Procedure Laterality Date  . BIOPSY  12/19/2017   Procedure: BIOPSY;  Surgeon: [name], MD;  [facility];  Service: Endoscopy;;  gastric  . CARDIAC CATHETERIZATION    . CHOLECYSTECTOMY  2004  . COLONOSCOPY  06/2009   normal terminal ileum, segmental mild inflammation of sigmoid colon (bx unremarkable), polyp, tubular adenoma  . COLONOSCOPY N/A 07/31/2013   Dr.Rourk- redundant anal canal hemorrhoids, colonic diverticulosis, tubular adenoma  . COLONOSCOPY W/ POLYPECTOMY  2004   rectal polyp with carcinoma in situ removed via colonoscopy  . COLONOSCOPY WITH PROPOFOL N/A 09/15/2015   Dr. [name]: Scattered diverticula throughout the colon, 2 sessile polyps found in the descending colon and cecum, 5 mm in size.  Cecal polyp was sessile serrated polyp, descending colon polyp was a tubular adenoma.  He had a abnormal perianal exam along with grade 3 hemorrhoids.  Surveillance exam recommended for 5-year follow-up.  . COLONOSCOPY WITH PROPOFOL N/A 12/19/2017   one 5 mm polyp in ascending colon and 1 cm sessile polyp in ascending s/p removal. Tubular adenoma. internal hemorrhoids  . CORONARY ANGIOPLASTY WITH STENT PLACEMENT  01/26/2012   "1; total is now 2"  . ESOPHAGOGASTRODUODENOSCOPY  02/2009   query Barrett's but bx negative  . ESOPHAGOGASTRODUODENOSCOPY  12/30/2010   [name],gastritis, dilated 90F, sm HH, 1 small ulcer, Duodenal erosions, benign bx  . ESOPHAGOGASTRODUODENOSCOPY (EGD) WITH ESOPHAGEAL DILATION N/A 09/12/2012   [id number] Schatzki's ring s/p Maloney dilator. Small hiatal hernia. negative path  . ESOPHAGOGASTRODUODENOSCOPY (EGD) WITH ESOPHAGEAL DILATION N/A 07/31/2013   Dr. [name]- normal egd, s/p [facility] dilation empirically. Normal small bowel biopsies   . ESOPHAGOGASTRODUODENOSCOPY (EGD) WITH PROPOFOL N/A 09/15/2015   Dr. [name]: Medium sized hiatal hernia, normal-appearing esophagus status post empiric dilation  . ESOPHAGOGASTRODUODENOSCOPY (EGD) WITH  PROPOFOL N/A 12/19/2017   erosive esophagitis s/p dilation, erosive gastropathy, normal duodenum  . FLEXIBLE SIGMOIDOSCOPY  12/30/2010    [name],; internal hemorrhoids, anal papilla  . HAND SURGERY     surgical intervention for injury of the fingers of the left hand many years ago  . heart stent    . HEMORRHOID BANDING     Dr. [name]  . LEFT HEART CATHETERIZATION WITH CORONARY ANGIOGRAM N/A 01/26/2012   Procedure: LEFT HEART CATHETERIZATION WITH CORONARY ANGIOGRAM;  Surgeon: [name], MD;  Location: [facility] CATH LAB;  Service: Cardiovascular;  Laterality: N/A;  . [name] DILATION N/A 09/15/2015   Procedure: [name] DILATION;  Surgeon: [name], MD;  [facility];  Service: Endoscopy;  Laterality: N/A;  . [name] DILATION N/A 12/19/2017   Procedure: [name] DILATION;  Surgeon: [name], MD;  [facility];  Service: Endoscopy;  Laterality: N/A;  . NASAL SEPTOPLASTY W/ TURBINOPLASTY  10/06/2011   Procedure: NASAL SEPTOPLASTY WITH TURBINATE REDUCTION;  Surgeon: Izora Gala, MD;  Location: [place];  Service: ENT;  Laterality: Bilateral;  . PERCUTANEOUS CORONARY STENT INTERVENTION (PCI-S) N/A 01/26/2012   Procedure: PERCUTANEOUS CORONARY STENT INTERVENTION (PCI-S);  Surgeon: [name], MD;  Location: [facility] CATH LAB;  Service: Cardiovascular;  Laterality: N/A;  . POLYPECTOMY  09/15/2015   Procedure: POLYPECTOMY;  Surgeon: [name], MD;  [facility];  Service: Endoscopy;;  Cecal polyp removed via cold snare/ Descending colon polyp removed via cold snare  . POLYPECTOMY  12/19/2017   Procedure: POLYPECTOMY;  Surgeon: [name], MD;  [facility];  Service: Endoscopy;;  colon     Home Medications:  Prior to Admission medications   Medication Sig Start Date End Date Taking? Authorizing Provider  albuterol (PROVENTIL) (2.5 MG/3ML) 0.083% nebulizer solution Take 2.5 mg by nebulization every 6 (six) hours as needed for wheezing or shortness  of breath.     [provider]  ALPRAZolam (XANAX) 1 MG tablet TAKE 1 TABLET (1 MG TOTAL) BY MOUTH 4 (FOUR) TIMES DAILY. 05/28/19   Cloria Spring, MD  aspirin EC 81 MG tablet Take 81 mg by mouth daily.    [provider]  atorvastatin (LIPITOR) 80 MG tablet TAKE 1 TABLET BY MOUTH EVERY DAY 05/01/19   Arnoldo Lenis, MD  budesonide-formoterol [facility]) 80-4.5 MCG/ACT inhaler Inhale 2 puffs into the lungs 2 (two) times daily. 07/22/16   [name], MD  dicyclomine (BENTYL) 10 MG capsule Take 10 mg by mouth 4 (four) times daily. 08/11/19   [provider]  FLUoxetine (PROZAC) 20 MG capsule Take 1 capsule (20 mg total) by mouth daily. 01/26/19   Cloria Spring, MD  FLUoxetine (PROZAC) 40 MG capsule TAKE 1 CAPSULE BY MOUTH EVERY DAY 06/19/19   Cloria Spring, MD  fluticasone [facility]) 50 MCG/ACT nasal spray Place 2 sprays into both nostrils daily. Patient taking differently: Place 2 sprays into both nostrils daily as needed for allergies.  10/19/16   [name], MD  gabapentin (NEURONTIN) 300 MG capsule Take 2 capsules (600 mg total) by mouth 3 (three) times daily. 01/26/19   Cloria Spring, MD  HYDROcodone-acetaminophen (NORCO) 7.5-325 MG tablet Take 1 tablet by mouth 2 (two) times daily as needed for moderate pain. 06/21/19   [name], MD  hydrocortisone (ANUSOL-HC) 2.5 % rectal cream Place 1 application rectally 2 (two) times daily. 05/04/19   Annitta Needs, NP  lipase/protease/amylase (CREON) 36000 UNITS CPEP capsule Take 2 capsules (72,000 Units total) by mouth 3 (three) times daily with meals. 1 with snacks 05/04/19   Annitta Needs, NP  lisinopril (ZESTRIL) 2.5 MG tablet TAKE 1 TABLET (2.5 MG TOTAL) BY MOUTH DAILY. **STOP VERAPAMIL** 07/19/19   [name], MD  meloxicam (MOBIC) 15 MG tablet TAKE 1 TABLET BY MOUTH EVERY DAY 07/19/19   [name], MD  metoprolol succinate (TOPROL-XL) 50 MG 24 hr tablet Take 1 tablet (50 mg total) by mouth daily. 11/20/18    Strader, Fransisco Hertz, PA-C  nitroGLYCERIN (NITROSTAT) 0.4 MG SL tablet Place 0.4 mg under the tongue every 5 (five) minutes as needed for chest pain.    [provider]  OLANZapine (ZYPREXA) 10 MG tablet Take 1 tablet (10 mg total) by mouth at bedtime. 01/26/19 01/26/20  Cloria Spring, MD  pantoprazole (PROTONIX) 40 MG tablet Take 1 tablet (40 mg total) by mouth 2 (two) times daily before a meal. 04/20/19   Gordy Levan,  Eric A, NP  rOPINIRole (REQUIP) 2 MG tablet TAKE 1 TABLET BY MOUTH DAILY AT BEDTIME 05/14/19   Luking, Scott A, MD  temazepam (RESTORIL) 30 MG capsule Take 1 capsule (30 mg total) by mouth at bedtime as needed for sleep. 01/26/19   Cloria Spring, MD  TRELEGY ELLIPTA 100-62.5-25 MCG/INH AEPB Inhale 1 puff into the lungs daily as needed. 08/07/19   Annitta Needs, NP  amLODipine (NORVASC) 5 MG tablet Take 5 mg by mouth daily.   04/13/19  [provider]    Inpatient Medications: Scheduled Meds:  Continuous Infusions:  PRN Meds:   Allergies:   No Known Allergies  Social History:   Social History   Socioeconomic History  . Marital status: Married    Spouse name: Not on file  . Number of children: 3  . Years of education: Not on file  . Highest education level: Not on file  Occupational History  . Occupation: disable    Employer: RETIRED    Comment: [employer]  Tobacco Use  . Smoking status: Current Some Day Smoker    Packs/day: 0.50    Years: 30.00    Pack years: 15.00    Types: Cigarettes    Start date: 07/31/1969  . Smokeless tobacco: Former Systems developer  . Tobacco comment: cutting back trying to quit  Substance and Sexual Activity  . Alcohol use: Yes    Comment: Drinks a beer occasionally  . Drug use: No  . Sexual activity: Not Currently  Other Topics Concern  . Not on file  Social History Narrative   3 stepchildren   Social Determinants of Health   Financial Resource Strain:   . Difficulty of Paying Living Expenses:   Food Insecurity:   . Worried  About Charity fundraiser in the Last Year:   . Arboriculturist in the Last Year:   Transportation Needs:   . Film/video editor (Medical):   Marland Kitchen Lack of Transportation (Non-Medical):   Physical Activity:   . Days of Exercise per Week:   . Minutes of Exercise per Session:   Stress:   . Feeling of Stress :   Social Connections:   . Frequency of Communication with Friends and Family:   . Frequency of Social Gatherings with Friends and Family:   . Attends Religious Services:   . Active Member of Clubs or Organizations:   . Attends Archivist Meetings:   Marland Kitchen Marital Status:   Intimate Partner Violence:   . Fear of Current or Ex-Partner:   . Emotionally Abused:   Marland Kitchen Physically Abused:   . Sexually Abused:     Family History:    Family History  Problem Relation Age of Onset  . Lung disease Father        deceased, black lung  . Heart disease Mother        blood clots  . Depression Mother   . Cancer Paternal Uncle        unknown type  . Cancer Maternal Aunt        unknown type  . Kidney failure Maternal Uncle   . Hypertension Brother   . Colon cancer Neg Hx   . ADD / ADHD Neg Hx   . Alcohol abuse Neg Hx   . Drug abuse Neg Hx   . Anxiety disorder Neg Hx   . Bipolar disorder Neg Hx   . Dementia Neg Hx   . OCD Neg Hx   .  Paranoid behavior Neg Hx   . Schizophrenia Neg Hx   . Physical abuse Neg Hx   . Sexual abuse Neg Hx   . Seizures Neg Hx      ROS:  Please see the history of present illness.   All other ROS reviewed and negative.     Physical Exam/Data:   Vitals:   08/28/19 1058 08/28/19 1100 08/28/19 1112 08/28/19 1130  BP:  (!) 143/88  136/85  Pulse:  77  77  Resp:  18  14  Temp:  98.5 F (36.9 C)    TempSrc:  Oral    SpO2:  97% 100% 94%  Weight: 82.6 kg     Height: 5\' 10"  (1.778 m)      No intake or output data in the 24 hours ending 08/28/19 1533 Last 3 Weights 08/28/2019 08/07/2019 07/19/2019  Weight (lbs) 182 lb 183 lb 12.8 oz 176 lb  Weight  (kg) 82.555 kg 83.371 kg 79.833 kg  Some encounter information is confidential and restricted. Go to Review Flowsheets activity to see all data.     Body mass index is 26.11 kg/m.  General:  Well nourished, well developed, in no acute distress HEENT: normal Lymph: no adenopathy Neck: no JVD Endocrine:  No thryomegaly Vascular: No carotid bruits Cardiac:  normal S1, S2; RRR; no murmur  Lungs:  clear to auscultation bilaterally, no wheezing, rhonchi or rales  Abd: soft, nontender, no hepatomegaly  Ext: no edema Musculoskeletal:  No deformities, BUE and BLE strength normal and equal Skin: warm and dry  Neuro:  CNs 2-12 intact, no focal abnormalities noted Psych:  Normal affect   EKG:  The EKG was personally reviewed and demonstrates: Sinus rhythm with incomplete right bundle branch block, old inferior infarct, T wave inversions in 3, and nonspecific T wave abnormalities in aVF.  Telemetry:  Telemetry was personally reviewed and demonstrates: Sinus rhythm  Relevant CV Studies:  Nuclear stress test 09/09/2017:   There was no ST segment deviation noted during stress.  Findings consistent with large prior inferior/inferolateral myocardial infarction.  This is an intermediate risk study. Risk based on decreased LVEF, there is no current myocardium at jeopardy.  The left ventricular ejection fraction is mildly decreased (45%).    Cardiac catheterization 01/26/2012:  Procedural Findings:              Hemodynamics:                                      AO 135/83                                     LV 136/19              Coronary angiography:   Coronary dominance:    Codominant right  Left mainstem:   Normal.  Left anterior descending (LAD):   Ostial 30%,  Proximal 30% focal, 30% mid. Moderate sized branching D1 with mild calcification and luminal irregularities.    Left circumflex (LCx):  AV groove large.  Proximal focal 30%.  Small ramus intermedius normal.  Large OM1  proximal 25%.  Mid 25%.  OM2 moderate sized with long ostial 80%.  PL x 2 small and normal  Right coronary artery (RCA):  Codominant and moderate sized with proximal 99% focal stenosis.  Left ventriculography:  LVEF is estimated at 65, there is no significant mitral regurgitation.  Mild inferior hypokinesis.    Final Conclusions:  Severe single vessel disease.  Preserved EF  Recommendations: Plan PCI of the RCA.  Medical management of OM stenosis which is stable and not a culprit vessel.   Laboratory Data:  High Sensitivity Troponin:   Recent Labs  Lab 08/28/19 1204 08/28/19 1329  TROPONINIHS 3 3     Chemistry Recent Labs  Lab 08/28/19 1204  NA 142  K 3.7  CL 108  CO2 26  GLUCOSE 105*  BUN 12  CREATININE 0.74  CALCIUM 8.6*  GFRNONAA >60  GFRAA >60  ANIONGAP 8    No results for input(s): PROT, ALBUMIN, AST, ALT, ALKPHOS, BILITOT in the last 168 hours. Hematology Recent Labs  Lab 08/28/19 1204  WBC 6.5  RBC 4.35  HGB 14.4  HCT 41.0  MCV 94.3  MCH 33.1  MCHC 35.1  RDW 13.4  PLT 151   BNP Recent Labs  Lab 08/28/19 1213  BNP 50.0    DDimer No results for input(s): DDIMER in the last 168 hours.   Radiology/Studies:  DG Chest Portable 1 View  Result Date: 08/28/2019 CLINICAL DATA:  Chest pain. Additional history provided: Patient reports chest pain and shortness of breath since Saturday, denies cough or fever, history of asthma and COPD, history of MI and cardiac stent. EXAM: PORTABLE CHEST 1 VIEW COMPARISON:  Chest radiograph 04/03/2018 FINDINGS: Heart size within normal limits. Aortic atherosclerosis. No evidence of airspace consolidation within the lungs. No pleural effusion or pneumothorax. No acute bony abnormality. IMPRESSION: No evidence of acute cardiopulmonary abnormality. Aortic atherosclerosis. Electronically Signed   By: Kellie Simmering DO   On: 08/28/2019 12:19       Assessment and Plan:   1.  Unstable angina: Symptoms are suspicious for  unstable angina. He had unstable angina in August 2013 and underwent PCI to the RCA using a bare-metal stent on 01/26/2012.  Prior to that he sustained an inferior MI with PTCA to the left circumflex 1993.  Currently on aspirin 81 mg, atorvastatin 80 mg, and Toprol-XL 50 mg daily.  He did receive 325 mg of aspirin in the ED.  I will start IV heparin.  I spoke to him about transferring to [facility] for coronary angiography (likely on 08/29/2019) and he is agreeable. Risks and benefits of cardiac catheterization have been discussed with the patient.  These include bleeding, infection, kidney damage, stroke, heart attack, death.  The patient understands these risks and is willing to proceed. I will order a 2-D echocardiogram with Doppler to evaluate cardiac structure, function, and regional wall motion.  2.  Hypertension: He has had markedly elevated blood pressures.  The dose of lisinopril may need to be increased.  3.  Hyperlipidemia: Currently on atorvastatin 80 mg daily.  I will check lipids.  4.  COPD: Stable.  5.  Tobacco use: He smokes 1/2 pack of cigarettes daily.   For questions or updates, please contact [place] Please consult [email or web address] for contact info under     Signed, Kate Sable, MD  08/28/2019 3:33 PM

## 2019-08-28 NOTE — Care Management Obs Status (Signed)
MEDICARE OBSERVATION STATUS NOTIFICATION   Patient Details  Name: [name] MRN: [id number] Date of Birth: 1955/09/20   Medicare Observation Status Notification Given:  Yes    Sherie Don, LCSW 08/28/2019, 6:40 PM

## 2019-08-28 NOTE — Progress Notes (Signed)
ANTICOAGULATION CONSULT NOTE - Initial Consult  Pharmacy Consult for Heparin Indication: chest pain/ACS  No Known Allergies  Patient Measurements: Height: 5\' 10"  (177.8 cm) Weight: 182 lb (82.6 kg) IBW/kg (Calculated) : 73 HEPARIN DW (KG): 82.6  Vital Signs: Temp: 98.5 F (36.9 C) (03/16 1100) Temp Source: Oral (03/16 1100) BP: 136/85 (03/16 1130) Pulse Rate: 77 (03/16 1130)  Labs: Recent Labs    08/28/19 1204 08/28/19 1329  HGB 14.4  --   HCT 41.0  --   PLT 151  --   CREATININE 0.74  --   TROPONINIHS 3 3    Estimated Creatinine Clearance: 97.6 mL/min (by C-G formula based on SCr of 0.74 mg/dL).   Medical History: Past Medical History:  Diagnosis Date  . Anxiety   . Anxiety and depression   . ASCVD (arteriosclerotic cardiovascular disease)    inferior MI with circumflex PTCA in 1993;cath in 2000-50% OM 1&2,30% LAD ;2008-25% LAD, 80% nondominant right ,60% small OM 2,EF of 55% with severe basilar inferior hypokinesis; stress nuclear study in 04/2008 moderate inferior scar with peri-infarction ischemic  . Asthma   . CAD (coronary artery disease)   . Carcinoma in situ of colon 2004   rectal polyp  . Colitis, ischemic ([employer]) 2011  . COPD (chronic obstructive pulmonary disease) (HCC)    mild ;excercise induced hypoxemia by cp stress test ;asthma ,bronchitis,  . Depression   . Diverticulosis   . Gastritis 12/30/10   EGD Dr [name]  . GERD (gastroesophageal reflux disease)   . GI bleed   . H. pylori infection 2004   treated  . Hemorrhoids   . Hiatal hernia   . Hyperlipidemia   . Hypertension   . Inflammatory polyps of colon with rectal bleeding ([place])   . Leukocytosis    [name]  . Lung nodule 07/16/2011  . Myocardial infarction [facility])    age 42  . Restless leg syndrome   . Schatzki's ring   . Sleep apnea    does not use CPAP:cannot tolerate, PCP aware  . Syncope   . Tobacco abuse    50 pack years continuing at one halp pack daily  . Tubular  adenoma of colon 06/2009   Colonosocpy Dr [name]    Medications:  See Med Rec  Assessment: 64 y.o. male with a hx of coronary artery disease is being seen for the evaluation of chest pain and shortness of breath . Reviewed home meds and no oral anticoagulants on profile. Pharmacy asked to start heparin.  Goal of Therapy:  Heparin level 0.3-0.7 units/ml Monitor platelets by anticoagulation protocol: Yes   Plan:  Give 4000 units bolus x 1 Start heparin infusion at 1000 units/hr Check anti-Xa level in ~6 hours and daily while on heparin Continue to monitor H&H and platelets  Isac Sarna, BS Vena [place], BCPS Clinical Pharmacist Pager [phone] 08/28/2019,3:51 PM

## 2019-08-28 NOTE — Telephone Encounter (Signed)
Will be having catheterization coming up.  We are aware of his care thank you.

## 2019-08-28 NOTE — ED Notes (Signed)
Called [facility] for transport to [facility]. 

## 2019-08-28 NOTE — Telephone Encounter (Signed)
Pt's wife called because pt has been having chest tightness for 4 days, sob, bp today 154/124 and states it has been higher than that other times it was checked. Advised her he needs to go to ED. She states she will go to [facility] but wanted to keep dr scott updated. I told her he could follow his notes through the computer system. I tried to call [facility] triage and no answer.

## 2019-08-29 ENCOUNTER — Ambulatory Visit (HOSPITAL_COMMUNITY): Payer: Medicare PPO

## 2019-08-29 ENCOUNTER — Encounter (HOSPITAL_COMMUNITY)
Admission: EM | Disposition: A | Discharge status: Home / Self Care | Payer: Self-pay | Source: Home / Self Care | Attending: Emergency Medicine

## 2019-08-29 DIAGNOSIS — I25119 Atherosclerotic heart disease of native coronary artery with unspecified angina pectoris: Secondary | ICD-10-CM

## 2019-08-29 DIAGNOSIS — I200 Unstable angina: Secondary | ICD-10-CM | POA: Diagnosis not present

## 2019-08-29 DIAGNOSIS — I25110 Atherosclerotic heart disease of native coronary artery with unstable angina pectoris: Secondary | ICD-10-CM | POA: Diagnosis not present

## 2019-08-29 DIAGNOSIS — Z720 Tobacco use: Secondary | ICD-10-CM | POA: Diagnosis not present

## 2019-08-29 DIAGNOSIS — E785 Hyperlipidemia, unspecified: Secondary | ICD-10-CM | POA: Diagnosis not present

## 2019-08-29 HISTORY — PX: LEFT HEART CATH AND CORONARY ANGIOGRAPHY: CATH118249

## 2019-08-29 LAB — LIPID PANEL
Cholesterol: 129 mg/dL (ref 0–200)
HDL: 31 mg/dL — ABNORMAL LOW (ref >40)
LDL Cholesterol: UNDETERMINED mg/dL (ref 0–99)
Total CHOL/HDL Ratio: 4.2 RATIO
Triglycerides: 413 mg/dL — ABNORMAL HIGH (ref <150)
VLDL: UNDETERMINED mg/dL (ref 0–40)

## 2019-08-29 LAB — CBC
HCT: 42.1 % (ref 39.0–52.0)
Hemoglobin: 14.7 g/dL (ref 13.0–17.0)
MCH: 32.7 pg (ref 26.0–34.0)
MCHC: 34.9 g/dL (ref 30.0–36.0)
MCV: 93.6 fL (ref 80.0–100.0)
Platelets: 154 10*3/uL (ref 150–400)
RBC: 4.5 MIL/uL (ref 4.22–5.81)
RDW: 13.4 % (ref 11.5–15.5)
WBC: 6.3 10*3/uL (ref 4.0–10.5)
nRBC: 0 % (ref 0.0–0.2)

## 2019-08-29 LAB — LDL CHOLESTEROL, DIRECT: Direct LDL: 51 mg/dL (ref 0–99)

## 2019-08-29 LAB — HEPARIN LEVEL (UNFRACTIONATED)
Heparin Unfractionated: 0.29 IU/mL — ABNORMAL LOW (ref 0.30–0.70)
Heparin Unfractionated: 0.67 IU/mL (ref 0.30–0.70)

## 2019-08-29 SURGERY — LEFT HEART CATH AND CORONARY ANGIOGRAPHY
Anesthesia: LOCAL

## 2019-08-29 MED ORDER — VERAPAMIL HCL 2.5 MG/ML IV SOLN
INTRAVENOUS | Status: AC
  Filled 2019-08-29: qty 2

## 2019-08-29 MED ORDER — SODIUM CHLORIDE 0.9 % IV SOLN: 250.0000 mL | INTRAVENOUS | Status: DC | PRN

## 2019-08-29 MED ORDER — MIDAZOLAM HCL 2 MG/2ML IJ SOLN
INTRAMUSCULAR | Status: DC | PRN
  Administered 2019-08-29: 2 mg via INTRAVENOUS

## 2019-08-29 MED ORDER — ISOSORBIDE MONONITRATE ER 30 MG PO TB24: 30.0000 mg | ORAL_TABLET | Freq: Every day | ORAL | 3 refills | Status: DC

## 2019-08-29 MED ORDER — HEPARIN SODIUM (PORCINE) 1000 UNIT/ML IJ SOLN
INTRAMUSCULAR | Status: DC | PRN
  Administered 2019-08-29: 4000 [IU] via INTRAVENOUS

## 2019-08-29 MED ORDER — FENTANYL CITRATE (PF) 100 MCG/2ML IJ SOLN
INTRAMUSCULAR | Status: AC
  Filled 2019-08-29: qty 2

## 2019-08-29 MED ORDER — SODIUM CHLORIDE 0.9 % WEIGHT BASED INFUSION: 1.0000 mL/kg/h | INTRAVENOUS | Status: AC

## 2019-08-29 MED ORDER — HEPARIN SODIUM (PORCINE) 1000 UNIT/ML IJ SOLN
INTRAMUSCULAR | Status: AC
  Filled 2019-08-29: qty 1

## 2019-08-29 MED ORDER — LIDOCAINE HCL (PF) 1 % IJ SOLN
INTRAMUSCULAR | Status: DC | PRN
  Administered 2019-08-29: 2 mL

## 2019-08-29 MED ORDER — HYDRALAZINE HCL 20 MG/ML IJ SOLN: 10.0000 mg | INTRAMUSCULAR | Status: DC | PRN

## 2019-08-29 MED ORDER — SODIUM CHLORIDE 0.9% FLUSH: 3.0000 mL | Freq: Two times a day (BID) | INTRAVENOUS | Status: DC

## 2019-08-29 MED ORDER — VERAPAMIL HCL 2.5 MG/ML IV SOLN
INTRAVENOUS | Status: DC | PRN
  Administered 2019-08-29: 10 mL via INTRA_ARTERIAL

## 2019-08-29 MED ORDER — SODIUM CHLORIDE 0.9% FLUSH: 3.0000 mL | INTRAVENOUS | Status: DC | PRN

## 2019-08-29 MED ORDER — HEPARIN (PORCINE) IN NACL 1000-0.9 UT/500ML-% IV SOLN
INTRAVENOUS | Status: AC
  Filled 2019-08-29: qty 1000

## 2019-08-29 MED ORDER — HEPARIN (PORCINE) IN NACL 1000-0.9 UT/500ML-% IV SOLN
INTRAVENOUS | Status: DC | PRN
  Administered 2019-08-29 (×2): 500 mL

## 2019-08-29 MED ORDER — LIDOCAINE HCL (PF) 1 % IJ SOLN
INTRAMUSCULAR | Status: AC
  Filled 2019-08-29: qty 30

## 2019-08-29 MED ORDER — MIDAZOLAM HCL 2 MG/2ML IJ SOLN
INTRAMUSCULAR | Status: AC
  Filled 2019-08-29: qty 2

## 2019-08-29 MED ORDER — FENTANYL CITRATE (PF) 100 MCG/2ML IJ SOLN
INTRAMUSCULAR | Status: DC | PRN
  Administered 2019-08-29: 25 ug via INTRAVENOUS

## 2019-08-29 MED ORDER — LABETALOL HCL 5 MG/ML IV SOLN: 10.0000 mg | INTRAVENOUS | Status: DC | PRN

## 2019-08-29 MED ORDER — IOHEXOL 350 MG/ML SOLN
INTRAVENOUS | Status: DC | PRN
  Administered 2019-08-29: 110 mL via INTRA_ARTERIAL

## 2019-08-29 SURGICAL SUPPLY — 11 items
CATH INFINITI 5 FR JL3.5 (CATHETERS) ×1 IMPLANT
CATH OPTITORQUE TIG 4.0 5F (CATHETERS) ×1 IMPLANT
DEVICE RAD COMP TR BAND LRG (VASCULAR PRODUCTS) ×1 IMPLANT
GLIDESHEATH SLEND SS 6F .021 (SHEATH) ×1 IMPLANT
GUIDEWIRE INQWIRE 1.5J.035X260 (WIRE) IMPLANT
INQWIRE 1.5J .035X260CM (WIRE) ×2
KIT HEART LEFT (KITS) ×2 IMPLANT
PACK CARDIAC CATHETERIZATION (CUSTOM PROCEDURE TRAY) ×2 IMPLANT
SHEATH PROBE COVER 6X72 (BAG) ×1 IMPLANT
TRANSDUCER W/STOPCOCK (MISCELLANEOUS) ×2 IMPLANT
TUBING CIL FLEX 10 FLL-RA (TUBING) ×2 IMPLANT

## 2019-08-29 NOTE — Progress Notes (Addendum)
Progress Note  Patient Name: [name] Date of Encounter: 08/29/2019  Primary Cardiologist: Carlyle Dolly, MD   Subjective   Chest pain free, ready for heart cath this morning  Inpatient Medications    Scheduled Meds: . [MAR Hold] aspirin EC  81 mg Oral Daily  . [MAR Hold] atorvastatin  80 mg Oral q1800  . [MAR Hold] dicyclomine  10 mg Oral TID AC & HS  . [MAR Hold] FLUoxetine  40 mg Oral Daily  . [MAR Hold] gabapentin  600 mg Oral TID  . [MAR Hold] lipase/protease/amylase  72,000 Units Oral TID WC  . [MAR Hold] metoprolol succinate  50 mg Oral Daily  . [MAR Hold] mometasone-formoterol  2 puff Inhalation BID  . [MAR Hold] OLANZapine  10 mg Oral QHS  . [MAR Hold] pantoprazole  40 mg Oral BID AC  . [MAR Hold] sodium chloride flush  3 mL Intravenous Q12H   Continuous Infusions: . sodium chloride    . sodium chloride 1 mL/kg/hr (08/29/19 0500)  . heparin 1,100 Units/hr (08/29/19 0324)   PRN Meds: sodium chloride, [MAR Hold] acetaminophen, [MAR Hold] albuterol, [MAR Hold] ALPRAZolam, [MAR Hold] fluticasone furoate-vilanterol **AND** [MAR Hold] umeclidinium bromide, [MAR Hold] HYDROcodone-acetaminophen, [MAR Hold] nitroGLYCERIN, [MAR Hold] ondansetron (ZOFRAN) IV, sodium chloride flush, [MAR Hold] temazepam   Vital Signs    Vitals:   08/29/19 0346 08/29/19 0809 08/29/19 0811 08/29/19 0916  BP: 129/88  (!) 164/92   Pulse: 66 74 81   Resp: 14  19   Temp:      TempSrc:      SpO2: 98%  95% 98%  Weight:      Height:        Intake/Output Summary (Last 24 hours) at 08/29/2019 1109 Last data filed at 08/29/2019 0808 Gross per 24 hour  Intake 310 ml  Output 1000 ml  Net -690 ml   Last 3 Weights 08/28/2019 08/28/2019 08/07/2019  Weight (lbs) 173 lb 4.8 oz 182 lb 183 lb 12.8 oz  Weight (kg) 78.608 kg 82.555 kg 83.371 kg  Some encounter information is confidential and restricted. Go to Review Flowsheets activity to see all data.      Telemetry    Sinus rhythm in the  7s0 - Personally Reviewed  ECG    No new tracings - Personally Reviewed  Physical Exam   GEN: No acute distress.   Neck: No JVD Cardiac: RRR, no murmurs, rubs, or gallops.  Respiratory: Clear to auscultation bilaterally. GI: Soft, nontender, non-distended  MS: No edema; No deformity. Neuro:  Nonfocal  Psych: Normal affect   Labs    High Sensitivity Troponin:   Recent Labs  Lab 08/28/19 1204 08/28/19 1329  TROPONINIHS 3 3      Chemistry Recent Labs  Lab 08/28/19 1204  NA 142  K 3.7  CL 108  CO2 26  GLUCOSE 105*  BUN 12  CREATININE 0.74  CALCIUM 8.6*  GFRNONAA >60  GFRAA >60  ANIONGAP 8     Hematology Recent Labs  Lab 08/28/19 1204 08/29/19 0236  WBC 6.5 6.3  RBC 4.35 4.50  HGB 14.4 14.7  HCT 41.0 42.1  MCV 94.3 93.6  MCH 33.1 32.7  MCHC 35.1 34.9  RDW 13.4 13.4  PLT 151 154    BNP Recent Labs  Lab 08/28/19 1213  BNP 50.0     DDimer No results for input(s): DDIMER in the last 168 hours.   Radiology    DG Chest Portable 1 View  Result Date: 08/28/2019 CLINICAL DATA:  Chest pain. Additional history provided: Patient reports chest pain and shortness of breath since Saturday, denies cough or fever, history of asthma and COPD, history of MI and cardiac stent. EXAM: PORTABLE CHEST 1 VIEW COMPARISON:  Chest radiograph 04/03/2018 FINDINGS: Heart size within normal limits. Aortic atherosclerosis. No evidence of airspace consolidation within the lungs. No pleural effusion or pneumothorax. No acute bony abnormality. IMPRESSION: No evidence of acute cardiopulmonary abnormality. Aortic atherosclerosis. Electronically Signed   By: Kellie Simmering DO   On: 08/28/2019 12:19    Cardiac Studies   Left heart cath today  Patient Profile     64 y.o. male with a hx of coronary artery disease who is being seen today for the evaluation of chest pain and shortness of breath.  Assessment & Plan    1. Unstable angina 2. 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 Hypertension - presented with elevated pressure - pressures elevated this morning - may need to restart home 2.5 mg lisinopril after heart cath   4. Hyperlipidemia  - will update lipid profile - on 80 mg lipitor   5. COPD 6. Ongoing tobacco use - smokes 1/2 ppd   For questions or updates, please contact [facility] Please consult [email or web address] for contact info under        Signed, Ledora Bottcher, PA  08/29/2019, 11:09 AM     I have seen and examined the patient along with Fabian Sharp, PA.  I have reviewed the chart, notes and new data.  I agree with PA/NP's note.  I saw Mr. Parmeter after he completed his cardiac catheterization and discussed the angiographic findings with Dr. Ellyn Hack.  Key new complaints: No longer has any chest discomfort.  Denies dyspnea or other cardiovascular complaints. Key examination changes: Healthy right radial access site (wristband still on).  Normal cardiovascular exam. Key new findings / data: Angiography shows interval occlusion of a previously diseased moderate-sized second oblique marginal branch, not amenable to revascularization due to flush occlusion and small size.  Cardiac enzymes were normal and ECG does not show acute ischemic changes. Echocardiogram has not yet been performed.  Although it might confirm a wall motion abnormality in the lateral wall, it will not change management.  It can be pursued as an outpatient if necessary.  PLAN: We will discharge home after the appropriate bed rest, as long as there are no access site complications. Add long-acting nitrates (isosorbide mononitrate 30 mg once daily).  He is already on beta-blocker, aspirin.  I do not think clopidogrel is indicated  with a chronic total occlusion of the oblique marginal branch and the negative cardiac enzymes. Spent a long time discussing the importance of smoking cessation, as the single most valuable intervention to prevent progression of coronary disease and future complications. His LDL is excellent on the current statin dose, well under the target of 70.  Triglycerides are elevated at 413 (I am not sure this is a truly fasting specimen) and the HDL is low at 31.  I do not think that adding more lipid-lowering medication is the answer, but rather trying to reduce the intake of simple carbohydrates and starches with high glycemic index and saturated fat,  increasing the intake of lean protein and unsaturated fat.  It is possible that the olanzapine is also contributing to his unfavorable lipid profile.  I am not sure whether there is a good alternative for this.  Sanda Klein, MD, [place] [phone] 08/29/2019, 2:01 PM

## 2019-08-29 NOTE — H&P (View-Only) (Signed)
Progress Note  Patient Name: [name] Date of Encounter: 08/29/2019  Primary Cardiologist: Carlyle Dolly, MD   Subjective   Chest pain free, ready for heart cath this morning  Inpatient Medications    Scheduled Meds: . [MAR Hold] aspirin EC  81 mg Oral Daily  . [MAR Hold] atorvastatin  80 mg Oral q1800  . [MAR Hold] dicyclomine  10 mg Oral TID AC & HS  . [MAR Hold] FLUoxetine  40 mg Oral Daily  . [MAR Hold] gabapentin  600 mg Oral TID  . [MAR Hold] lipase/protease/amylase  72,000 Units Oral TID WC  . [MAR Hold] metoprolol succinate  50 mg Oral Daily  . [MAR Hold] mometasone-formoterol  2 puff Inhalation BID  . [MAR Hold] OLANZapine  10 mg Oral QHS  . [MAR Hold] pantoprazole  40 mg Oral BID AC  . [MAR Hold] sodium chloride flush  3 mL Intravenous Q12H   Continuous Infusions: . sodium chloride    . sodium chloride 1 mL/kg/hr (08/29/19 0500)  . heparin 1,100 Units/hr (08/29/19 0324)   PRN Meds: sodium chloride, [MAR Hold] acetaminophen, [MAR Hold] albuterol, [MAR Hold] ALPRAZolam, [MAR Hold] fluticasone furoate-vilanterol **AND** [MAR Hold] umeclidinium bromide, [MAR Hold] HYDROcodone-acetaminophen, [MAR Hold] nitroGLYCERIN, [MAR Hold] ondansetron (ZOFRAN) IV, sodium chloride flush, [MAR Hold] temazepam   Vital Signs    Vitals:   08/29/19 0346 08/29/19 0809 08/29/19 0811 08/29/19 0916  BP: 129/88  (!) 164/92   Pulse: 66 74 81   Resp: 14  19   Temp:      TempSrc:      SpO2: 98%  95% 98%  Weight:      Height:        Intake/Output Summary (Last 24 hours) at 08/29/2019 1109 Last data filed at 08/29/2019 0808 Gross per 24 hour  Intake 310 ml  Output 1000 ml  Net -690 ml   Last 3 Weights 08/28/2019 08/28/2019 08/07/2019  Weight (lbs) 173 lb 4.8 oz 182 lb 183 lb 12.8 oz  Weight (kg) 78.608 kg 82.555 kg 83.371 kg  Some encounter information is confidential and restricted. Go to Review Flowsheets activity to see all data.      Telemetry    Sinus rhythm in the  7s0 - Personally Reviewed  ECG    No new tracings - Personally Reviewed  Physical Exam   GEN: No acute distress.   Neck: No JVD Cardiac: RRR, no murmurs, rubs, or gallops.  Respiratory: Clear to auscultation bilaterally. GI: Soft, nontender, non-distended  MS: No edema; No deformity. Neuro:  Nonfocal  Psych: Normal affect   Labs    High Sensitivity Troponin:   Recent Labs  Lab 08/28/19 1204 08/28/19 1329  TROPONINIHS 3 3      Chemistry Recent Labs  Lab 08/28/19 1204  NA 142  K 3.7  CL 108  CO2 26  GLUCOSE 105*  BUN 12  CREATININE 0.74  CALCIUM 8.6*  GFRNONAA >60  GFRAA >60  ANIONGAP 8     Hematology Recent Labs  Lab 08/28/19 1204 08/29/19 0236  WBC 6.5 6.3  RBC 4.35 4.50  HGB 14.4 14.7  HCT 41.0 42.1  MCV 94.3 93.6  MCH 33.1 32.7  MCHC 35.1 34.9  RDW 13.4 13.4  PLT 151 154    BNP Recent Labs  Lab 08/28/19 1213  BNP 50.0     DDimer No results for input(s): DDIMER in the last 168 hours.   Radiology    DG Chest Portable 1 View  Result Date: 08/28/2019 CLINICAL DATA:  Chest pain. Additional history provided: Patient reports chest pain and shortness of breath since Saturday, denies cough or fever, history of asthma and COPD, history of MI and cardiac stent. EXAM: PORTABLE CHEST 1 VIEW COMPARISON:  Chest radiograph 04/03/2018 FINDINGS: Heart size within normal limits. Aortic atherosclerosis. No evidence of airspace consolidation within the lungs. No pleural effusion or pneumothorax. No acute bony abnormality. IMPRESSION: No evidence of acute cardiopulmonary abnormality. Aortic atherosclerosis. Electronically Signed   By: Kellie Simmering DO   On: 08/28/2019 12:19    Cardiac Studies   Left heart cath today  Patient Profile     64 y.o. male with a hx of coronary artery disease who is being seen today for the evaluation of chest pain and shortness of breath.  Assessment & Plan    1. Unstable angina 2. CAD s/p inferior MI with prior PTCA to LCx  1993, BMS to RCA 2013 - symptoms concerning for unstable angina in the setting of known disease - continue ASA, BB, statin - transferred to [facility] for heart catheterization today - pt is chest pain free this morning - ready for angiography   3. Hypertension - presented with elevated pressure - pressures elevated this morning - may need to restart home 2.5 mg lisinopril after heart cath   4. Hyperlipidemia  - will update lipid profile - on 80 mg lipitor   5. COPD 6. Ongoing tobacco use - smokes 1/2 ppd   For questions or updates, please contact [facility] Please consult [email or web address] for contact info under        Signed, Ledora Bottcher, PA  08/29/2019, 11:09 AM

## 2019-08-29 NOTE — Interval H&P Note (Signed)
History and Physical Interval Note:  08/29/2019 11:23 AM  [name]  has presented today for surgery, with the diagnosis of unstable angina.  The various methods of treatment have been discussed with the patient and family. After consideration of risks, benefits and other options for treatment, the patient has consented to  Procedure(s): LEFT HEART CATH AND CORONARY ANGIOGRAPHY (N/A)  PERCUTANEOUS CORONARY INTERVENTION   as a surgical intervention.  The patient's history has been reviewed, patient examined, no change in status, stable for surgery.  I have reviewed the patient's chart and labs.  Questions were answered to the patient's satisfaction.     Glenetta Hew

## 2019-08-29 NOTE — Discharge Summary (Signed)
Discharge Summary    Patient ID: [name] MRN: [id number]; DOB: 06-22-1955  Admit date: 08/28/2019 Discharge date: 08/29/2019  Primary Care Provider: [name], MD  Primary Cardiologist: Carlyle Dolly, MD  Primary Electrophysiologist:  None   Discharge Diagnoses    Principal Problem:   Unstable angina [facility]) Active Problems:   Hyperlipidemia   Tobacco use   OBSTRUCTIVE SLEEP APNEA   Chronic obstructive pulmonary disease ([place])   Coronary artery disease involving native coronary artery of native heart with angina pectoris [facility])   Diagnostic Studies/Procedures    Left heart cath 08/29/19:  Previously placed Prox RCA stent (bare-metal stent) is widely patent.  2nd Mrg lesion is 100% stenosed. Previously documented is 80%.  Ost Cx to Prox Cx lesion is 45% stenosed. Prox Cx to Mid Cx lesion is 20% stenosed with 20% stenosed side branch in 1st Mrg.  --------------------  The left ventricular ejection fraction is 50-55% by visual estimate. LV end diastolic pressure is normal.  There is no aortic valve stenosis.  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   SUMMARY  Two-vessel CAD with widely patent RCA stent (at most codominant RCA), now totally occluded small caliber OM 2 branch previously noted to be 80%.  Mild to moderate (45%) ostial LCx disease.  Relatively preserved EF of roughly 55% with basal to mid inferior hypokinesis.  RECOMMENDATIONS  Optimization of medical management.  The OM branch not likely good PCI target as it is not even visible where it comes off the main circumflex.  Anticipate discharge home either today if stable or tomorrow. _____________   History of Present Illness     [name] is a 64 y.o. male with a hx of coronary artery disease, COPD, OSA, and GERD who is being seen today for the evaluation of chest pain and shortness of breath.  Mr. Letner is a 64 year old male with a  history of coronary artery disease.  He was last evaluated by Dr. Harl Bowie on 10/03/2018 via a telehealth visit.  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 He had unstable angina in August 2013 and underwent PCI to the RCA using a bare-metal stent on 01/26/2012.  Most recent echocardiogram in October 2017 demonstrated low normal LV systolic function, EF [id number].  Most recent nuclear stress test in March 2019 demonstrated inferior and inferolateral infarct with no ischemia, LVEF 45%.  He presented to the ED 08/28/19 complaining of chest pain and shortness of breath over the past 3 days.  His blood pressure has been elevated since last Friday with a reading of 158/124.  Upon speaking with him further, he tells me that over the past 2 or 3 weeks he has been getting more more short of breath when walking shorter distances.  Over the past few days when he walks to his mailbox there are times where he felt like he was going to pass out.  He denies exertional chest pain per se.  He does have intermittent retrosternal chest aching sensations.  He does not recall what his prior anginal symptoms were.  He has some wheezing when he wakes up in the morning.  He denies fever and chills as well as leg swelling, orthopnea, and paroxysmal nocturnal dyspnea.  When he begins exerting himself his heart starts racing.  He denies frank syncope.  He and his wife are raising their 53-year-old granddaughter and 86-year-old grandson as  his daughter committed suicide 2 years ago.  When he has been trying to play with them and chase them around the yard, he has noticed progressive exertional dyspnea over the last 2 to 3 weeks.  Troponins and BNP are normal.  CBC is normal.  Renal function is normal.  Chest x-ray today shows no acute cardiopulmonary abnormalities.  He smokes about 1/2 pack of cigarettes daily.  Hospital Course     Consultants:   Unstable angina with hx of inferior MI with  prior PTCA to the left Cx 1993; BMS to RCA 2013 Patient presented with symptoms concerning for unstable angina in the setting of known disease. He was transferred to [facility] for heart catheterization.  HS troponin x 2 negative. EKG nonischemic. Left heart cath revealed OM2 now 100% occluded not amenable to PCI, patent RCA stent. Medical therapy was recommended. He tolerated the procedure well. Chest pain has resolved. Will add imdur 30 mg to his regimen. Continue ASA, statin, BB, and ACEI.   DOE Current smoker Knows he needs to stop smoking.    Hypertension Medications as above.    Hyperlipidemia 08/29/2019: Cholesterol 129; HDL 31; LDL Cholesterol UNABLE TO CALCULATE IF TRIGLYCERIDE OVER 400 mg/dL; Triglycerides 413; VLDL UNABLE TO CALCULATE IF TRIGLYCERIDE OVER 400 mg/dL Direct LDL measured was 51.   Pt has been seen and examined by Dr. Sallyanne Kuster and deemed stable for discharge. Cardiology follow up has been arranged.    Did the patient have an acute coronary syndrome (MI, NSTEMI, STEMI, etc) this admission?:  No                               Did the patient have a percutaneous coronary intervention (stent / angioplasty)?:  No.   _____________  Discharge Vitals Blood pressure (!) 156/90, pulse 68, temperature 97.9 F (36.6 C), temperature source Oral, resp. rate 16, height 5\' 9"  (1.753 m), weight 78.6 kg, SpO2 96 %.  Filed Weights   08/28/19 1058 08/28/19 2056  Weight: 82.6 kg 78.6 kg    Labs & Radiologic Studies    CBC Recent Labs    08/28/19 1204 08/29/19 0236  WBC 6.5 6.3  HGB 14.4 14.7  HCT 41.0 42.1  MCV 94.3 93.6  PLT 151 [id number]   Basic Metabolic Panel Recent Labs    08/28/19 1204  NA 142  K 3.7  CL 108  CO2 26  GLUCOSE 105*  BUN 12  CREATININE 0.74  CALCIUM 8.6*   Liver Function Tests No results for input(s): AST, ALT, ALKPHOS, BILITOT, PROT, ALBUMIN in the last 72 hours. No results for input(s): LIPASE, AMYLASE in the last 72 hours. High Sensitivity  Troponin:   Recent Labs  Lab 08/28/19 1204 08/28/19 1329  TROPONINIHS 3 3    BNP Invalid input(s): POCBNP D-Dimer No results for input(s): DDIMER in the last 72 hours. Hemoglobin A1C No results for input(s): HGBA1C in the last 72 hours. Fasting Lipid Panel Recent Labs    08/29/19 0236  CHOL 129  HDL 31*  LDLCALC UNABLE TO CALCULATE IF TRIGLYCERIDE OVER 400 mg/dL  TRIG 413*  CHOLHDL 4.2  LDLDIRECT 51.0   Thyroid Function Tests No results for input(s): TSH, T4TOTAL, T3FREE, THYROIDAB in the last 72 hours.  Invalid input(s): FREET3 _____________  CARDIAC CATHETERIZATION  Result Date: 08/29/2019  Previously placed Prox RCA stent (bare-metal stent) is widely patent.  2nd Mrg lesion is 100% stenosed. Previously documented is 80%.  Ost Cx to Prox Cx lesion is 45% stenosed. Prox Cx to Mid Cx lesion is 20% stenosed with 20% stenosed side branch in 1st Mrg.  --------------------  The left ventricular ejection fraction is 50-55% by visual estimate. LV end diastolic pressure is normal.  There is no aortic valve stenosis.  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  SUMMARY  Two-vessel CAD with widely patent RCA stent (at most codominant RCA), now totally occluded small caliber OM 2 branch previously noted to be 80%.  Mild to moderate (45%) ostial LCx disease.  Relatively preserved EF of roughly 55% with basal to mid inferior hypokinesis. RECOMMENDATIONS  Optimization of medical management.  The OM branch not likely good PCI target as it is not even visible where it comes off the main circumflex.  Anticipate discharge home either today if stable or tomorrow. Glenetta Hew, MD  DG Chest Portable 1 View  Result Date: 08/28/2019 CLINICAL DATA:  Chest pain. Additional history provided: Patient reports chest pain and shortness of breath since Saturday, denies cough or fever, history of asthma and COPD, history of MI and cardiac stent. EXAM:  PORTABLE CHEST 1 VIEW COMPARISON:  Chest radiograph 04/03/2018 FINDINGS: Heart size within normal limits. Aortic atherosclerosis. No evidence of airspace consolidation within the lungs. No pleural effusion or pneumothorax. No acute bony abnormality. IMPRESSION: No evidence of acute cardiopulmonary abnormality. Aortic atherosclerosis. Electronically Signed   By: Kellie Simmering DO   On: 08/28/2019 12:19   Disposition   Pt is being discharged home today in good condition.  Follow-up Plans & Appointments    Follow-up Information    Erma Heritage, PA-C Follow up on 09/13/2019.   Specialties: Librarian, academic, Cardiology Why: 1:00 pm for [facility] Contact information: [address] [place] [address] [phone]            Discharge Medications   Allergies as of 08/29/2019   No Known Allergies     Medication List    TAKE these medications   albuterol (2.5 MG/3ML) 0.083% nebulizer solution Commonly known as: PROVENTIL Take 2.5 mg by nebulization every 6 (six) hours as needed for wheezing or shortness of breath.   ALPRAZolam 1 MG tablet Commonly known as: XANAX TAKE 1 TABLET (1 MG TOTAL) BY MOUTH 4 (FOUR) TIMES DAILY.   aspirin EC 81 MG tablet Take 81 mg by mouth daily.   atorvastatin 80 MG tablet Commonly known as: LIPITOR TAKE 1 TABLET BY MOUTH EVERY DAY   budesonide-formoterol 80-4.5 MCG/ACT inhaler Commonly known as: Symbicort Inhale 2 puffs into the lungs 2 (two) times daily.   dicyclomine 10 MG capsule Commonly known as: BENTYL Take 10 mg by mouth 4 (four) times daily.   FLUoxetine 20 MG capsule Commonly known as: PROZAC Take 1 capsule (20 mg total) by mouth daily.   FLUoxetine 40 MG capsule Commonly known as: PROZAC TAKE 1 CAPSULE BY MOUTH EVERY DAY   fluticasone 50 MCG/ACT nasal spray Commonly known as: Flonase Place 2 sprays into both nostrils daily. What changed:   when to take this  reasons to take this   gabapentin 300 MG capsule Commonly  known as: NEURONTIN Take 2 capsules (600 mg total) by mouth 3 (three) times daily.   HYDROcodone-acetaminophen 7.5-325 MG tablet Commonly known as: NORCO Take 1 tablet by mouth 2 (two) times daily as needed for moderate pain.   hydrocortisone 2.5 % rectal cream Commonly known as: ANUSOL-HC Place 1 application  rectally 2 (two) times daily.   isosorbide mononitrate 30 MG 24 hr tablet Commonly known as: IMDUR Take 1 tablet (30 mg total) by mouth daily.   lipase/protease/amylase 36000 UNITS Cpep capsule Commonly known as: CREON Take 2 capsules (72,000 Units total) by mouth 3 (three) times daily with meals. 1 with snacks   lisinopril 2.5 MG tablet Commonly known as: ZESTRIL TAKE 1 TABLET (2.5 MG TOTAL) BY MOUTH DAILY. **STOP VERAPAMIL** What changed: See the new instructions.   meloxicam 15 MG tablet Commonly known as: MOBIC TAKE 1 TABLET BY MOUTH EVERY DAY   metoprolol succinate 50 MG 24 hr tablet Commonly known as: TOPROL-XL Take 1 tablet (50 mg total) by mouth daily.   nitroGLYCERIN 0.4 MG SL tablet Commonly known as: NITROSTAT Place 0.4 mg under the tongue every 5 (five) minutes as needed for chest pain.   OLANZapine 10 MG tablet Commonly known as: ZyPREXA Take 1 tablet (10 mg total) by mouth at bedtime.   pantoprazole 40 MG tablet Commonly known as: PROTONIX Take 1 tablet (40 mg total) by mouth 2 (two) times daily before a meal.   rOPINIRole 2 MG tablet Commonly known as: REQUIP TAKE 1 TABLET BY MOUTH DAILY AT BEDTIME   temazepam 30 MG capsule Commonly known as: Restoril Take 1 capsule (30 mg total) by mouth at bedtime as needed for sleep.   Trelegy Ellipta 100-62.5-25 MCG/INH Aepb Generic drug: Fluticasone-Umeclidin-Vilant Inhale 1 puff into the lungs daily as needed.          Outstanding Labs/Studies   none  Duration of Discharge Encounter   Greater than 30 minutes including physician time.  Signed, Tami Lin Augustina Braddock, PA 08/29/2019, 2:41  PM

## 2019-08-29 NOTE — CV Procedure (Signed)
Echocardiogram not completed, patient is going to the cath lab.   [name] [facility] 08/29/19 10:56

## 2019-08-29 NOTE — Progress Notes (Signed)
ANTICOAGULATION CONSULT NOTE   Pharmacy Consult for Heparin Indication: chest pain/ACS  No Known Allergies  Patient Measurements: Height: 5\' 9"  (175.3 cm) Weight: 173 lb 4.8 oz (78.6 kg) IBW/kg (Calculated) : 70.7 HEPARIN DW (KG): 78.6  Vital Signs: Temp: 97.9 F (36.6 C) (03/16 2056) Temp Source: Oral (03/16 2056) BP: 128/89 (03/16 2326) Pulse Rate: 69 (03/16 2326)  Labs: Recent Labs    08/28/19 1204 08/28/19 1329 08/29/19 0236  HGB 14.4  --  14.7  HCT 41.0  --  42.1  PLT 151  --  154  APTT 29  --   --   HEPARINUNFRC  --   --  0.29*  CREATININE 0.74  --   --   TROPONINIHS 3 3  --     Estimated Creatinine Clearance: 94.5 mL/min (by C-G formula based on SCr of 0.74 mg/dL).   Medical History: Past Medical History:  Diagnosis Date  . Anxiety   . Anxiety and depression   . ASCVD (arteriosclerotic cardiovascular disease)    inferior MI with circumflex PTCA in 1993;cath in 2000-50% OM 1&2,30% LAD ;2008-25% LAD, 80% nondominant right ,60% small OM 2,EF of 55% with severe basilar inferior hypokinesis; stress nuclear study in 04/2008 moderate inferior scar with peri-infarction ischemic  . Asthma   . CAD (coronary artery disease)   . Carcinoma in situ of colon 2004   rectal polyp  . Colitis, ischemic ([place]) 2011  . COPD (chronic obstructive pulmonary disease) (HCC)    mild ;excercise induced hypoxemia by cp stress test ;asthma ,bronchitis,  . Depression   . Diverticulosis   . Gastritis 12/30/10   EGD Dr [name]  . GERD (gastroesophageal reflux disease)   . GI bleed   . H. pylori infection 2004   treated  . Hemorrhoids   . Hiatal hernia   . Hyperlipidemia   . Hypertension   . Inflammatory polyps of colon with rectal bleeding ([place])   . Leukocytosis    [name]  . Lung nodule 07/16/2011  . Myocardial infarction [facility])    age 23  . Restless leg syndrome   . Schatzki's ring   . Sleep apnea    does not use CPAP:cannot tolerate, PCP aware  . Syncope   .  Tobacco abuse    50 pack years continuing at one halp pack daily  . Tubular adenoma of colon 06/2009   Colonosocpy Dr [name]    Medications:  See Med Rec  Assessment: 64 y.o. male with a hx of coronary artery disease is being seen for the evaluation of chest pain and shortness of breath . Reviewed home meds and no oral anticoagulants on profile. Pharmacy asked to start heparin.  3/17 AM update: Heparin level just below goal No issues per RN  Goal of Therapy:  Heparin level 0.3-0.7 units/ml Monitor platelets by anticoagulation protocol: Yes   Plan:  Inc heparin to 1100 units/hr Re-check heparin level at [place], PharmD, [place] Pharmacist Phone: [phone]

## 2019-09-05 ENCOUNTER — Other Ambulatory Visit (HOSPITAL_COMMUNITY): Payer: Self-pay | Admitting: Psychiatry

## 2019-09-05 NOTE — Telephone Encounter (Signed)
LVM PER PROVIDER: Call for appt  LAST OFFICE VISIT 02/05/2019

## 2019-09-05 NOTE — Telephone Encounter (Signed)
Call for appt

## 2019-09-13 ENCOUNTER — Encounter: Payer: Self-pay | Admitting: Student

## 2019-09-13 ENCOUNTER — Ambulatory Visit: Payer: Medicare PPO | Admitting: Student

## 2019-09-13 ENCOUNTER — Encounter (HOSPITAL_COMMUNITY)
Admission: RE | Admit: 2019-09-13 | Discharge: 2019-09-13 | Disposition: A | Discharge status: Home / Self Care | Payer: Medicare PPO | Source: Ambulatory Visit | Attending: Internal Medicine | Admitting: Internal Medicine

## 2019-09-13 ENCOUNTER — Telehealth: Payer: Self-pay

## 2019-09-13 ENCOUNTER — Other Ambulatory Visit (HOSPITAL_COMMUNITY)
Admission: RE | Admit: 2019-09-13 | Discharge: 2019-09-13 | Disposition: A | Discharge status: Home / Self Care | Payer: Medicare PPO | Source: Ambulatory Visit | Attending: Internal Medicine | Admitting: Internal Medicine

## 2019-09-13 ENCOUNTER — Other Ambulatory Visit: Payer: Self-pay

## 2019-09-13 VITALS — BP 136/82 | HR 89 | Temp 98.7°F | Ht 70.0 in | Wt 173.0 lb

## 2019-09-13 DIAGNOSIS — E785 Hyperlipidemia, unspecified: Secondary | ICD-10-CM

## 2019-09-13 DIAGNOSIS — I25118 Atherosclerotic heart disease of native coronary artery with other forms of angina pectoris: Secondary | ICD-10-CM

## 2019-09-13 DIAGNOSIS — J449 Chronic obstructive pulmonary disease, unspecified: Secondary | ICD-10-CM | POA: Diagnosis not present

## 2019-09-13 DIAGNOSIS — I10 Essential (primary) hypertension: Secondary | ICD-10-CM | POA: Diagnosis not present

## 2019-09-13 DIAGNOSIS — Z01812 Encounter for preprocedural laboratory examination: Secondary | ICD-10-CM | POA: Insufficient documentation

## 2019-09-13 DIAGNOSIS — Z20822 Contact with and (suspected) exposure to covid-19: Secondary | ICD-10-CM | POA: Insufficient documentation

## 2019-09-13 MED ORDER — TRELEGY ELLIPTA 100-62.5-25 MCG/INH IN AEPB: 1.0000 | INHALATION_SPRAY | Freq: Every day | RESPIRATORY_TRACT | 2 refills | Status: DC

## 2019-09-13 MED ORDER — ALBUTEROL SULFATE (2.5 MG/3ML) 0.083% IN NEBU: 2.5000 mg | INHALATION_SOLUTION | Freq: Four times a day (QID) | RESPIRATORY_TRACT | 1 refills | Status: DC | PRN

## 2019-09-13 NOTE — Telephone Encounter (Signed)
Tried to call pt to see if he can arrive earlier for EGD/DIL 09/17/19, LMOVM for return call.

## 2019-09-13 NOTE — Progress Notes (Signed)
Cardiology Office Note    Date:  09/13/2019   ID:  [name], DOB 08-30-1955, MRN [id number]  PCP:  [name], MD  Cardiologist: Carlyle Dolly, MD    Chief Complaint  Patient presents with  . Hospitalization Follow-up    History of Present Illness:    [name] is a 64 y.o. male with past medical history of CAD (s/p PTCA to LCx in 1993, BMS to RCA in 01/2012 with residual 80% OM2 stenosis and medical management recommended), HTN, HLD, and COPD who presents to the office today for hospital follow-up.   He most recently presented to [facility] ED on 08/28/2019 for evaluation of worsening dyspnea on exertion for the past 2 to 3 weeks. He had also developed associated chest discomfort in the 3 days leading to admission. Troponin values were negative and his EKG showed no acute ischemic changes but given his symptoms being concerning for unstable angina, he was started on IV Heparin and transferred to [facility] for a cardiac catheterization. This was performed the following day and showed a widely patent RCA stent and now totally occluded OM 2 branch. He did have mild to moderate ostial LCx disease and mild plaque along the LAD. The OM branch was not felt to be a good PCI target given the small caliber and location, therefore continued medical management was recommended. He was continued on ASA 81 mg daily, Atorvastatin 80 mg daily, Lisinopril 2.5mg  daily, and Toprol-XL 50mg  daily with Imdur 30mg  daily being added to his medication regimen.  In talking with the patient today, he reports feeling weak since his catheterization but says this continues to improve. He has not experienced episodes of pain as severe as what brought him to the ED initially but does report intermittent discomfort which can occur at rest or with activity. He continues to have shortness of breath with activity but mentions he has not used his Trelegy inhaler in several months since Dr. Luan Pulling retired and has  not used his Albuterol nebulizer due to being out of the solution. He denies any associated orthopnea, PND or lower extremity edema.  Reports occasional headaches since starting Imdur.   He does have solid food dysphagia and is scheduled for an EGD with Dr. [name] on 09/17/2019.  Past Medical History:  Diagnosis Date  . Anxiety   . Anxiety and depression   . ASCVD (arteriosclerotic cardiovascular disease)    a. s/p PTCA to LCx in 1993 b. BMS to RCA in 01/2012 with residual 80% OM2 stenosis and medical management recommended c. 08/2019: cath showing patent stents with occluded OM2 --> medical management.   . Asthma   . CAD (coronary artery disease)   . Carcinoma in situ of colon 2004   rectal polyp  . Colitis, ischemic ([place]) 2011  . COPD (chronic obstructive pulmonary disease) (HCC)    mild ;excercise induced hypoxemia by cp stress test ;asthma ,bronchitis,  . Depression   . Diverticulosis   . Gastritis 12/30/10   EGD Dr [name]  . GERD (gastroesophageal reflux disease)   . GI bleed   . H. pylori infection 2004   treated  . Hemorrhoids   . Hiatal hernia   . Hyperlipidemia   . Hypertension   . Inflammatory polyps of colon with rectal bleeding ([place])   . Leukocytosis    [name]  . Lung nodule 07/16/2011  . Myocardial infarction [facility])    age 69  . Restless leg syndrome   .  Schatzki's ring   . Sleep apnea    does not use CPAP:cannot tolerate, PCP aware  . Syncope   . Tobacco abuse    50 pack years continuing at one halp pack daily  . Tubular adenoma of colon 06/2009   Colonosocpy Dr [name]    Past Surgical History:  Procedure Laterality Date  . BIOPSY  12/19/2017   Procedure: BIOPSY;  Surgeon: [name], MD;  [facility];  Service: Endoscopy;;  gastric  . CARDIAC CATHETERIZATION    . CHOLECYSTECTOMY  2004  . COLONOSCOPY  06/2009   normal terminal ileum, segmental mild inflammation of sigmoid colon (bx unremarkable), polyp, tubular adenoma  .  COLONOSCOPY N/A 07/31/2013   Dr.Rourk- redundant anal canal hemorrhoids, colonic diverticulosis, tubular adenoma  . COLONOSCOPY W/ POLYPECTOMY  2004   rectal polyp with carcinoma in situ removed via colonoscopy  . COLONOSCOPY WITH PROPOFOL N/A 09/15/2015   Dr. [name]: Scattered diverticula throughout the colon, 2 sessile polyps found in the descending colon and cecum, 5 mm in size.  Cecal polyp was sessile serrated polyp, descending colon polyp was a tubular adenoma.  He had a abnormal perianal exam along with grade 3 hemorrhoids.  Surveillance exam recommended for 5-year follow-up.  . COLONOSCOPY WITH PROPOFOL N/A 12/19/2017   one 5 mm polyp in ascending colon and 1 cm sessile polyp in ascending s/p removal. Tubular adenoma. internal hemorrhoids  . CORONARY ANGIOPLASTY WITH STENT PLACEMENT  01/26/2012   "1; total is now 2"  . ESOPHAGOGASTRODUODENOSCOPY  02/2009   query Barrett's but bx negative  . ESOPHAGOGASTRODUODENOSCOPY  12/30/2010   [name],gastritis, dilated 70F, sm HH, 1 small ulcer, Duodenal erosions, benign bx  . ESOPHAGOGASTRODUODENOSCOPY (EGD) WITH ESOPHAGEAL DILATION N/A 09/12/2012   [id number] Schatzki's ring s/p Maloney dilator. Small hiatal hernia. negative path  . ESOPHAGOGASTRODUODENOSCOPY (EGD) WITH ESOPHAGEAL DILATION N/A 07/31/2013   Dr. [name]- normal egd, s/p [facility] dilation empirically. Normal small bowel biopsies   . ESOPHAGOGASTRODUODENOSCOPY (EGD) WITH PROPOFOL N/A 09/15/2015   Dr. [name]: Medium sized hiatal hernia, normal-appearing esophagus status post empiric dilation  . ESOPHAGOGASTRODUODENOSCOPY (EGD) WITH PROPOFOL N/A 12/19/2017   erosive esophagitis s/p dilation, erosive gastropathy, normal duodenum  . FLEXIBLE SIGMOIDOSCOPY  12/30/2010    [name],; internal hemorrhoids, anal papilla  . HAND SURGERY     surgical intervention for injury of the fingers of the left hand many years ago  . heart stent    . HEMORRHOID BANDING     Dr. [name]  . LEFT HEART CATH  AND CORONARY ANGIOGRAPHY N/A 08/29/2019   Procedure: LEFT HEART CATH AND CORONARY ANGIOGRAPHY;  Surgeon: [name], MD;  Location: [facility];  Service: Cardiovascular;  Laterality: N/A;  . LEFT HEART CATHETERIZATION WITH CORONARY ANGIOGRAM N/A 01/26/2012   Procedure: LEFT HEART CATHETERIZATION WITH CORONARY ANGIOGRAM;  Surgeon: [name], MD;  Location: [facility] CATH LAB;  Service: Cardiovascular;  Laterality: N/A;  . [name] DILATION N/A 09/15/2015   Procedure: [name] DILATION;  Surgeon: [name], MD;  [facility];  Service: Endoscopy;  Laterality: N/A;  . [name] DILATION N/A 12/19/2017   Procedure: [name] DILATION;  Surgeon: [name], MD;  [facility];  Service: Endoscopy;  Laterality: N/A;  . NASAL SEPTOPLASTY W/ TURBINOPLASTY  10/06/2011   Procedure: NASAL SEPTOPLASTY WITH TURBINATE REDUCTION;  Surgeon: Izora Gala, MD;  Location: [facility];  Service: ENT;  Laterality: Bilateral;  . PERCUTANEOUS CORONARY STENT INTERVENTION (PCI-S) N/A  01/26/2012   Procedure: PERCUTANEOUS CORONARY STENT INTERVENTION (PCI-S);  Surgeon: [name], MD;  Location: [facility] CATH LAB;  Service: Cardiovascular;  Laterality: N/A;  . POLYPECTOMY  09/15/2015   Procedure: POLYPECTOMY;  Surgeon: [name], MD;  [facility];  Service: Endoscopy;;  Cecal polyp removed via cold snare/ Descending colon polyp removed via cold snare  . POLYPECTOMY  12/19/2017   Procedure: POLYPECTOMY;  Surgeon: [name], MD;  [facility];  Service: Endoscopy;;  colon    Current Medications: Outpatient Medications Prior to Visit  Medication Sig Dispense Refill  . ALPRAZolam (XANAX) 1 MG tablet TAKE 1 TABLET (1 MG TOTAL) BY MOUTH 4 (FOUR) TIMES DAILY. 120 tablet 0  . aspirin EC 81 MG tablet Take 81 mg by mouth daily.    Marland Kitchen atorvastatin (LIPITOR) 80 MG tablet TAKE 1 TABLET BY MOUTH EVERY DAY (Patient taking differently: Take 80 mg by mouth daily. ) 90 tablet 3  .  budesonide-formoterol (SYMBICORT) 80-4.5 MCG/ACT inhaler Inhale 2 puffs into the lungs 2 (two) times daily. 1 Inhaler 12  . dicyclomine (BENTYL) 10 MG capsule Take 10 mg by mouth 4 (four) times daily.    Marland Kitchen FLUoxetine (PROZAC) 20 MG capsule Take 1 capsule (20 mg total) by mouth daily. (Patient taking differently: Take 20 mg by mouth daily. Take with 40 mg to equal 60 mg daily) 90 capsule 2  . FLUoxetine (PROZAC) 40 MG capsule TAKE 1 CAPSULE BY MOUTH EVERY DAY (Patient taking differently: Take 40 mg by mouth daily. Take with 20 mg to equal 60 mg daily) 90 capsule 2  . fluticasone (FLONASE) 50 MCG/ACT nasal spray Place 2 sprays into both nostrils daily. (Patient taking differently: Place 2 sprays into both nostrils daily as needed for allergies. ) 16 g 5  . gabapentin (NEURONTIN) 300 MG capsule Take 2 capsules (600 mg total) by mouth 3 (three) times daily. 540 capsule 1  . HYDROcodone-acetaminophen (NORCO) 7.5-325 MG tablet Take 1 tablet by mouth 2 (two) times daily as needed for moderate pain. 60 tablet 0  . hydrocortisone (ANUSOL-HC) 2.5 % rectal cream Place 1 application rectally 2 (two) times daily. 30 g 1  . isosorbide mononitrate (IMDUR) 30 MG 24 hr tablet Take 1 tablet (30 mg total) by mouth daily. 90 tablet 3  . lipase/protease/amylase (CREON) 36000 UNITS CPEP capsule Take 2 capsules (72,000 Units total) by mouth 3 (three) times daily with meals. 1 with snacks 240 capsule 3  . lisinopril (ZESTRIL) 2.5 MG tablet TAKE 1 TABLET (2.5 MG TOTAL) BY MOUTH DAILY. **STOP VERAPAMIL** (Patient taking differently: Take 2.5 mg by mouth daily. ) 90 tablet 1  . meloxicam (MOBIC) 15 MG tablet TAKE 1 TABLET BY MOUTH EVERY DAY (Patient taking differently: Take 15 mg by mouth daily. ) 30 tablet 5  . metoprolol succinate (TOPROL-XL) 50 MG 24 hr tablet Take 1 tablet (50 mg total) by mouth daily. 90 tablet 3  . nitroGLYCERIN (NITROSTAT) 0.4 MG SL tablet Place 0.4 mg under the tongue every 5 (five) minutes as needed for  chest pain.    Marland Kitchen OLANZapine (ZYPREXA) 10 MG tablet Take 1 tablet (10 mg total) by mouth at bedtime. 90 tablet 2  . pantoprazole (PROTONIX) 40 MG tablet Take 1 tablet (40 mg total) by mouth 2 (two) times daily before a meal. 180 tablet 3  . rOPINIRole (REQUIP) 2 MG tablet TAKE 1 TABLET BY MOUTH DAILY AT BEDTIME (Patient taking differently: Take 2 mg by mouth at  bedtime. ) 90 tablet 2  . temazepam (RESTORIL) 30 MG capsule Take 1 capsule (30 mg total) by mouth at bedtime as needed for sleep. (Patient taking differently: Take 30 mg by mouth at bedtime. ) 30 capsule 2  . albuterol (PROVENTIL) (2.5 MG/3ML) 0.083% nebulizer solution Take 2.5 mg by nebulization every 6 (six) hours as needed for wheezing or shortness of breath.     . TRELEGY ELLIPTA 100-62.5-25 MCG/INH AEPB Inhale 1 puff into the lungs daily as needed. (Patient taking differently: Inhale 1 puff into the lungs daily. ) 60 each 1   No facility-administered medications prior to visit.     Allergies:   Patient has no known allergies.   Social History   Socioeconomic History  . Marital status: Married    Spouse name: Not on file  . Number of children: 3  . Years of education: Not on file  . Highest education level: Not on file  Occupational History  . Occupation: disable    Employer: RETIRED    Comment: [employer]  Tobacco Use  . Smoking status: Current Some Day Smoker    Packs/day: 0.50    Years: 30.00    Pack years: 15.00    Types: Cigarettes    Start date: 07/31/1969  . Smokeless tobacco: Former Systems developer  . Tobacco comment: cutting back trying to quit  Substance and Sexual Activity  . Alcohol use: Yes    Comment: Drinks a beer occasionally  . Drug use: No  . Sexual activity: Not Currently  Other Topics Concern  . Not on file  Social History Narrative   3 stepchildren   Social Determinants of Health   Financial Resource Strain:   . Difficulty of Paying Living Expenses:   Food Insecurity:   . Worried About Charity fundraiser  in the Last Year:   . Arboriculturist in the Last Year:   Transportation Needs:   . Film/video editor (Medical):   Marland Kitchen Lack of Transportation (Non-Medical):   Physical Activity:   . Days of Exercise per Week:   . Minutes of Exercise per Session:   Stress:   . Feeling of Stress :   Social Connections:   . Frequency of Communication with Friends and Family:   . Frequency of Social Gatherings with Friends and Family:   . Attends Religious Services:   . Active Member of Clubs or Organizations:   . Attends Archivist Meetings:   Marland Kitchen Marital Status:      Family History:  The patient's family history includes Cancer in his maternal aunt and paternal uncle; Depression in his mother; Heart disease in his mother; Hypertension in his brother; Kidney failure in his maternal uncle; Lung disease in his father.   Review of Systems:   Please see the history of present illness.     General:  No chills, fever, night sweats or weight changes.  Cardiovascular:  No chest pain, edema, orthopnea, palpitations, paroxysmal nocturnal dyspnea. Positive for dyspnea on exertion.  Dermatological: No rash, lesions/masses Respiratory: No cough, dyspnea Urologic: No hematuria, dysuria Abdominal:   No nausea, vomiting, diarrhea, bright red blood per rectum, melena, or hematemesis Neurologic:  No visual changes, wkns, changes in mental status. All other systems reviewed and are otherwise negative except as noted above.   Physical Exam:    VS:  BP 136/82   Pulse 89   Temp 98.7 F (37.1 C)   Ht 5\' 10"  (1.778 m)   Wt 173  lb (78.5 kg)   SpO2 98%   BMI 24.82 kg/m    General: Well developed, well nourished,male appearing in no acute distress. Head: Normocephalic, atraumatic, sclera non-icteric.  Neck: No carotid bruits. JVD not elevated.  Lungs: Respirations regular and unlabored, without wheezes or rales.  Heart: Regular rate and rhythm. No S3 or S4.  No murmur, no rubs, or gallops  appreciated. Abdomen: Soft, non-tender, non-distended. No obvious abdominal masses. Msk:  Strength and tone appear normal for age. No obvious joint deformities or effusions. Extremities: No clubbing or cyanosis. No edema.  Distal pedal pulses are 2+ bilaterally. Radial site stable without ecchymosis or evidence of a hematoma.  Neuro: Alert and oriented X 3. Moves all extremities spontaneously. No focal deficits noted. Psych:  Responds to questions appropriately with a normal affect. Skin: No rashes or lesions noted  Wt Readings from Last 3 Encounters:  09/13/19 173 lb (78.5 kg)  08/28/19 173 lb 4.8 oz (78.6 kg)  08/07/19 183 lb 12.8 oz (83.4 kg)     Studies/Labs Reviewed:   EKG:  EKG is not ordered today. EKG from 08/28/2019 is reviewed which shows NSR, HR 76 with no acute ST abnormalities when compared to prior tracings.   Recent Labs: 08/28/2019: B Natriuretic Peptide 50.0; BUN 12; Creatinine, Ser 0.74; Potassium 3.7; Sodium 142 08/29/2019: Hemoglobin 14.7; Platelets 154   Lipid Panel    Component Value Date/Time   CHOL 129 08/29/2019 0236   CHOL 138 12/13/2017 1603   TRIG 413 (H) 08/29/2019 0236   HDL 31 (L) 08/29/2019 0236   HDL 34 (L) 12/13/2017 1603   CHOLHDL 4.2 08/29/2019 0236   VLDL UNABLE TO CALCULATE IF TRIGLYCERIDE OVER 400 mg/dL 08/29/2019 0236   LDLCALC UNABLE TO CALCULATE IF TRIGLYCERIDE OVER 400 mg/dL 08/29/2019 0236   LDLCALC 43 12/13/2017 1603   LDLDIRECT 51.0 08/29/2019 0236    Additional studies/ records that were reviewed today include:   Cardiac Catheterization: 08/2019  Previously placed Prox RCA stent (bare-metal stent) is widely patent.  2nd Mrg lesion is 100% stenosed. Previously documented is 80%.  Ost Cx to Prox Cx lesion is 45% stenosed. Prox Cx to Mid Cx lesion is 20% stenosed with 20% stenosed side branch in 1st Mrg.  --------------------  The left ventricular ejection fraction is 50-55% by visual estimate. LV end diastolic pressure is  normal.  There is no aortic valve stenosis.  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   SUMMARY  Two-vessel CAD with widely patent RCA stent (at most codominant RCA), now totally occluded small caliber OM 2 branch previously noted to be 80%.  Mild to moderate (45%) ostial LCx disease.  Relatively preserved EF of roughly 55% with basal to mid inferior hypokinesis.   RECOMMENDATIONS  Optimization of medical management.  The OM branch not likely good PCI target as it is not even visible where it comes off the main circumflex.  Anticipate discharge home either today if stable or tomorrow.   Glenetta Hew, MD  Assessment:    1. Coronary artery disease involving native coronary artery of native heart with other form of angina pectoris ([place])   2. Essential hypertension   3. Hyperlipidemia LDL goal <70   4. Chronic obstructive pulmonary disease, unspecified COPD type ([place])      Plan:   In order of problems listed above:  1. CAD - s/p PTCA to LCx in 1993, BMS to RCA in 01/2012 with residual 80%  OM2 stenosis and medical management recommended. - Repeat cardiac catheterization last month showed a widely patent RCA stent and now totally occluded OM 2 branch. The OM branch was not felt to be a good PCI target given the small caliber and location, therefore continued medical management was recommended.  - He denies any significant pain resembling what brought into the ED but continues to have dyspnea on exertion.  I suspect this is secondary to his untreated COPD and will refill his inhalers as outlined below and refer to Pulmonology.   - He has been experiencing headaches with Imdur and I recommended he reduce this to 15 mg daily for the next 2 weeks and then titrate to 30 mg daily. If headaches persist, can try switching to Amlodipine.  - continue ASA, statin, and BB therapy.   2. HTN - BP initially at 142/80, improved to 136/82 on  recheck. I did encourage him to continue to follow BP in the ambulatory setting. Will continue Imdur, Lisinopril 2.5 mg daily and Toprol-XL 50 mg daily. Pending overall BP trend at home, with plan to further titrate Lisinopril.  3. HLD - FLP during recent admission showed total cholesterol of 129, triglycerides 413, HDL 31 and LDL 51. He remains on Atorvastatin 80mg  daily.   4. COPD - Refills of his Trelegy inhaler and Albuletol nebulizer were sent in. Referral to [facility] Pulmonology made as he has not established care with a new Pulmonologist since Dr. Luan Pulling retirement.    Medication Adjustments/Labs and Tests Ordered: Current medicines are reviewed at length with the patient today.  Concerns regarding medicines are outlined above.  Medication changes, Labs and Tests ordered today are listed in the Patient Instructions below. Patient Instructions  Medication Instructions:  Your physician recommends that you continue on your current medications as directed. Please refer to the Current Medication list given to you today.  Take Imdur 15 mg daily for 2 weeks then increase to 30 mg Daily   *If you need a refill on your cardiac medications before your next appointment, please call your pharmacy*   Lab Work: NONE   If you have labs (blood work) drawn today and your tests are completely normal, you will receive your results only by: Marland Kitchen MyChart Message (if you have MyChart) OR . A paper copy in the mail If you have any lab test that is abnormal or we need to change your treatment, we will call you to review the results.   Testing/Procedures: NONE    Follow-Up: At [facility], you and your health needs are our priority.  As part of our continuing mission to provide you with exceptional heart care, we have created designated Provider Care Teams.  These Care Teams include your primary Cardiologist (physician) and Advanced Practice Providers (APPs -  Physician Assistants and Nurse  Practitioners) who all work together to provide you with the care you need, when you need it.  We recommend signing up for the patient portal called "MyChart".  Sign up information is provided on this After Visit Summary.  MyChart is used to connect with patients for Virtual Visits (Telemedicine).  Patients are able to view lab/test results, encounter notes, upcoming appointments, etc.  Non-urgent messages can be sent to your provider as well.   To learn more about what you can do with MyChart, go to [email or web address].    Your next appointment:   3 month(s)  The format for your next appointment:   In Person  Provider:   Carlyle Dolly,  MD   Other Instructions Thank you for choosing [facility]!    Signed, Erma Heritage, PA-C  09/13/2019 3:39 PM    [facility] [address] [place], [place] [address] Phone: [phone] Fax: [phone]

## 2019-09-13 NOTE — Patient Instructions (Signed)
Medication Instructions:  Your physician recommends that you continue on your current medications as directed. Please refer to the Current Medication list given to you today.  Take Imdur 15 mg daily for 2 weeks then increase to 30 mg Daily   *If you need a refill on your cardiac medications before your next appointment, please call your pharmacy*   Lab Work: NONE   If you have labs (blood work) drawn today and your tests are completely normal, you will receive your results only by: Marland Kitchen MyChart Message (if you have MyChart) OR . A paper copy in the mail If you have any lab test that is abnormal or we need to change your treatment, we will call you to review the results.   Testing/Procedures: NONE    Follow-Up: At [facility] you and your health needs are our priority.  As part of our continuing mission to provide you with exceptional heart care, we have created designated Provider Care Teams.  These Care Teams include your primary Cardiologist (physician) and Advanced Practice Providers (APPs -  Physician Assistants and Nurse Practitioners) who all work together to provide you with the care you need, when you need it.  We recommend signing up for the patient portal called "MyChart".  Sign up information is provided on this After Visit Summary.  MyChart is used to connect with patients for Virtual Visits (Telemedicine).  Patients are able to view lab/test results, encounter notes, upcoming appointments, etc.  Non-urgent messages can be sent to your provider as well.   To learn more about what you can do with MyChart, go to [email or web address].    Your next appointment:   3 month(s)  The format for your next appointment:   In Person  Provider:   Carlyle Dolly, MD   Other Instructions Thank you for choosing [facility]!

## 2019-09-14 LAB — SARS CORONAVIRUS 2 (TAT 6-24 HRS): SARS Coronavirus 2: NEGATIVE

## 2019-09-17 ENCOUNTER — Encounter (HOSPITAL_COMMUNITY)
Admission: RE | Disposition: A | Discharge status: Home / Self Care | Payer: Self-pay | Source: Ambulatory Visit | Attending: Internal Medicine

## 2019-09-17 ENCOUNTER — Ambulatory Visit (HOSPITAL_COMMUNITY)
Admission: RE | Admit: 2019-09-17 | Discharge: 2019-09-17 | Disposition: A | Discharge status: Home / Self Care | Payer: Medicare PPO | Source: Ambulatory Visit | Attending: Internal Medicine | Admitting: Internal Medicine

## 2019-09-17 ENCOUNTER — Ambulatory Visit (HOSPITAL_COMMUNITY): Payer: Medicare PPO | Admitting: Anesthesiology

## 2019-09-17 DIAGNOSIS — Z7982 Long term (current) use of aspirin: Secondary | ICD-10-CM | POA: Insufficient documentation

## 2019-09-17 DIAGNOSIS — K449 Diaphragmatic hernia without obstruction or gangrene: Secondary | ICD-10-CM | POA: Diagnosis not present

## 2019-09-17 DIAGNOSIS — I2510 Atherosclerotic heart disease of native coronary artery without angina pectoris: Secondary | ICD-10-CM | POA: Insufficient documentation

## 2019-09-17 DIAGNOSIS — K222 Esophageal obstruction: Secondary | ICD-10-CM | POA: Insufficient documentation

## 2019-09-17 DIAGNOSIS — F329 Major depressive disorder, single episode, unspecified: Secondary | ICD-10-CM | POA: Insufficient documentation

## 2019-09-17 DIAGNOSIS — K219 Gastro-esophageal reflux disease without esophagitis: Secondary | ICD-10-CM | POA: Diagnosis not present

## 2019-09-17 DIAGNOSIS — G4730 Sleep apnea, unspecified: Secondary | ICD-10-CM | POA: Insufficient documentation

## 2019-09-17 DIAGNOSIS — F419 Anxiety disorder, unspecified: Secondary | ICD-10-CM | POA: Insufficient documentation

## 2019-09-17 DIAGNOSIS — I10 Essential (primary) hypertension: Secondary | ICD-10-CM | POA: Insufficient documentation

## 2019-09-17 DIAGNOSIS — J449 Chronic obstructive pulmonary disease, unspecified: Secondary | ICD-10-CM | POA: Insufficient documentation

## 2019-09-17 DIAGNOSIS — I252 Old myocardial infarction: Secondary | ICD-10-CM | POA: Diagnosis not present

## 2019-09-17 DIAGNOSIS — R1310 Dysphagia, unspecified: Secondary | ICD-10-CM | POA: Diagnosis not present

## 2019-09-17 DIAGNOSIS — Z791 Long term (current) use of non-steroidal anti-inflammatories (NSAID): Secondary | ICD-10-CM | POA: Insufficient documentation

## 2019-09-17 DIAGNOSIS — Z7951 Long term (current) use of inhaled steroids: Secondary | ICD-10-CM | POA: Insufficient documentation

## 2019-09-17 DIAGNOSIS — F17210 Nicotine dependence, cigarettes, uncomplicated: Secondary | ICD-10-CM | POA: Insufficient documentation

## 2019-09-17 DIAGNOSIS — G2581 Restless legs syndrome: Secondary | ICD-10-CM | POA: Insufficient documentation

## 2019-09-17 DIAGNOSIS — Z79899 Other long term (current) drug therapy: Secondary | ICD-10-CM | POA: Insufficient documentation

## 2019-09-17 HISTORY — PX: MALONEY DILATION: SHX5535

## 2019-09-17 HISTORY — PX: ESOPHAGOGASTRODUODENOSCOPY (EGD) WITH PROPOFOL: SHX5813

## 2019-09-17 SURGERY — ESOPHAGOGASTRODUODENOSCOPY (EGD) WITH PROPOFOL
Anesthesia: General

## 2019-09-17 MED ORDER — LACTATED RINGERS IV SOLN: Freq: Once | INTRAVENOUS | Status: AC

## 2019-09-17 MED ORDER — PROPOFOL 500 MG/50ML IV EMUL
INTRAVENOUS | Status: DC | PRN
  Administered 2019-09-17: 200 ug/kg/min via INTRAVENOUS

## 2019-09-17 MED ORDER — CHLORHEXIDINE GLUCONATE CLOTH 2 % EX PADS: 6.0000 | MEDICATED_PAD | Freq: Once | CUTANEOUS | Status: DC

## 2019-09-17 MED ORDER — LACTATED RINGERS IV SOLN: INTRAVENOUS | Status: DC | PRN

## 2019-09-17 MED ORDER — PROPOFOL 10 MG/ML IV BOLUS
INTRAVENOUS | Status: DC | PRN
  Administered 2019-09-17: 60 mg via INTRAVENOUS
  Administered 2019-09-17: 40 mg via INTRAVENOUS

## 2019-09-17 MED ORDER — LIDOCAINE HCL (CARDIAC) PF 100 MG/5ML IV SOSY
PREFILLED_SYRINGE | INTRAVENOUS | Status: DC | PRN
  Administered 2019-09-17: 50 mg via INTRATRACHEAL

## 2019-09-17 NOTE — Discharge Instructions (Signed)
EGD Discharge instructions Please read the instructions outlined below and refer to this sheet in the next few weeks. These discharge instructions provide you with general information on caring for yourself after you leave the hospital. Your doctor may also give you specific instructions. While your treatment has been planned according to the most current medical practices available, unavoidable complications occasionally occur. If you have any problems or questions after discharge, please call your doctor. ACTIVITY  You may resume your regular activity but move at a slower pace for the next 24 hours.   Take frequent rest periods for the next 24 hours.   Walking will help expel (get rid of) the air and reduce the bloated feeling in your abdomen.   No driving for 24 hours (because of the anesthesia (medicine) used during the test).   You may shower.   Do not sign any important legal documents or operate any machinery for 24 hours (because of the anesthesia used during the test).  NUTRITION  Drink plenty of fluids.   You may resume your normal diet.   Begin with a light meal and progress to your normal diet.   Avoid alcoholic beverages for 24 hours or as instructed by your caregiver.  MEDICATIONS  You may resume your normal medications unless your caregiver tells you otherwise.  WHAT YOU CAN EXPECT TODAY  You may experience abdominal discomfort such as a feeling of fullness or gas pains.  FOLLOW-UP  Your doctor will discuss the results of your test with you.  SEEK IMMEDIATE MEDICAL ATTENTION IF ANY OF THE FOLLOWING OCCUR:  Excessive nausea (feeling sick to your stomach) and/or vomiting.   Severe abdominal pain and distention (swelling).   Trouble swallowing.   Temperature over 101 F (37.8 C).   Rectal bleeding or vomiting of blood.    Gastroesophageal Reflux Disease, Adult Gastroesophageal reflux (GER) happens when acid from the stomach flows up into the tube that  connects the mouth and the stomach (esophagus). Normally, food travels down the esophagus and stays in the stomach to be digested. With GER, food and stomach acid sometimes move back up into the esophagus. You may have a disease called gastroesophageal reflux disease (GERD) if the reflux:  Happens often.  Causes frequent or very bad symptoms.  Causes problems such as damage to the esophagus. When this happens, the esophagus becomes sore and swollen (inflamed). Over time, GERD can make small holes (ulcers) in the lining of the esophagus. What are the causes? This condition is caused by a problem with the muscle between the esophagus and the stomach. When this muscle is weak or not normal, it does not close properly to keep food and acid from coming back up from the stomach. The muscle can be weak because of:  Tobacco use.  Pregnancy.  Having a certain type of hernia (hiatal hernia).  Alcohol use.  Certain foods and drinks, such as coffee, chocolate, onions, and peppermint. What increases the risk? You are more likely to develop this condition if you:  Are overweight.  Have a disease that affects your connective tissue.  Use NSAID medicines. What are the signs or symptoms? Symptoms of this condition include:  Heartburn.  Difficult or painful swallowing.  The feeling of having a lump in the throat.  A bitter taste in the mouth.  Bad breath.  Having a lot of saliva.  Having an upset or bloated stomach.  Belching.  Chest pain. Different conditions can cause chest pain. Make sure you see  your doctor if you have chest pain.  Shortness of breath or noisy breathing (wheezing).  Ongoing (chronic) cough or a cough at night.  Wearing away of the surface of teeth (tooth enamel).  Weight loss. How is this treated? Treatment will depend on how bad your symptoms are. Your doctor may suggest:  Changes to your diet.  Medicine.  Surgery. Follow these instructions at  home: Eating and drinking   Follow a diet as told by your doctor. You may need to avoid foods and drinks such as: ? Coffee and tea (with or without caffeine). ? Drinks that contain alcohol. ? Energy drinks and sports drinks. ? Bubbly (carbonated) drinks or sodas. ? Chocolate and cocoa. ? Peppermint and mint flavorings. ? Garlic and onions. ? Horseradish. ? Spicy and acidic foods. These include peppers, chili powder, curry powder, vinegar, hot sauces, and BBQ sauce. ? Citrus fruit juices and citrus fruits, such as oranges, lemons, and limes. ? Tomato-based foods. These include red sauce, chili, salsa, and pizza with red sauce. ? Fried and fatty foods. These include donuts, french fries, potato chips, and high-fat dressings. ? High-fat meats. These include hot dogs, rib eye steak, sausage, ham, and bacon. ? High-fat dairy items, such as whole milk, butter, and cream cheese.  Eat small meals often. Avoid eating large meals.  Avoid drinking large amounts of liquid with your meals.  Avoid eating meals during the 2-3 hours before bedtime.  Avoid lying down right after you eat.  Do not exercise right after you eat. Lifestyle   Do not use any products that contain nicotine or tobacco. These include cigarettes, e-cigarettes, and chewing tobacco. If you need help quitting, ask your doctor.  Try to lower your stress. If you need help doing this, ask your doctor.  If you are overweight, lose an amount of weight that is healthy for you. Ask your doctor about a safe weight loss goal. General instructions  Pay attention to any changes in your symptoms.  Take over-the-counter and prescription medicines only as told by your doctor. Do not take aspirin, ibuprofen, or other NSAIDs unless your doctor says it is okay.  Wear loose clothes. Do not wear anything tight around your waist.  Raise (elevate) the head of your bed about 6 inches (15 cm).  Avoid bending over if this makes your  symptoms worse.  Keep all follow-up visits as told by your doctor. This is important. Contact a doctor if:  You have new symptoms.  You lose weight and you do not know why.  You have trouble swallowing or it hurts to swallow.  You have wheezing or a cough that keeps happening.  Your symptoms do not get better with treatment.  You have a hoarse voice. Get help right away if:  You have pain in your arms, neck, jaw, teeth, or back.  You feel sweaty, dizzy, or light-headed.  You have chest pain or shortness of breath.  You throw up (vomit) and your throw-up looks like blood or coffee grounds.  You pass out (faint).  Your poop (stool) is bloody or black.  You cannot swallow, drink, or eat. Summary  If a person has gastroesophageal reflux disease (GERD), food and stomach acid move back up into the esophagus and cause symptoms or problems such as damage to the esophagus.  Treatment will depend on how bad your symptoms are.  Follow a diet as told by your doctor.  Take all medicines only as told by your doctor. This information  is not intended to replace advice given to you by your health care provider. Make sure you discuss any questions you have with your health care provider. Document Revised: 12/07/2017 Document Reviewed: 12/07/2017 Elsevier Patient Education  [place] esophagus was stretched today  GERD information provided  Continue Protonix 40 mg twice daily  Plan for office visit in 6 months  At patient request, I called wife Almyra Free at [phone] -call rolled to voicemail.

## 2019-09-17 NOTE — Anesthesia Preprocedure Evaluation (Addendum)
Anesthesia Evaluation  Patient identified by MRN, date of birth, ID band Patient awake    Reviewed: Allergy & Precautions, NPO status , Patient's Chart, lab work & pertinent test results  Airway Mallampati: III  TM Distance: >3 FB Neck ROM: Full    Dental  (+) Poor Dentition, Missing, Dental Advisory Given   Pulmonary asthma , sleep apnea and Continuous Positive Airway Pressure Ventilation , COPD,  COPD inhaler, Current SmokerPatient did not abstain from smoking.,           Cardiovascular Exercise Tolerance: Good hypertension, Pt. on medications + angina with exertion + CAD, + Past MI and + Cardiac Stents   Rhythm:Regular Rate:Normal  28-Aug-2019 11:05:25 [facility] ROUTINE RECORD Sinus rhythm [id number] is noted No acute changes No significant change since last tracing Confirmed by Varney Biles [id number]) on 08/28/2019 1:58:25 PM   Neuro/Psych PSYCHIATRIC DISORDERS Anxiety Depression RLS    GI/Hepatic Neg liver ROS, hiatal hernia, GERD (Difficulty swallowing, no GERD symptoms)  Medicated and Controlled,  Endo/Other  negative endocrine ROS  Renal/GU negative Renal ROS     Musculoskeletal   Abdominal   Peds  Hematology negative hematology ROS (+)   Anesthesia Other Findings  Previously placed Prox RCA stent (bare-metal stent) is widely patent. 2nd Mrg lesion is 100% stenosed. Previously documented is 80%.Ost Cx to Prox Cx lesion is 45% stenosed. Prox Cx to Mid Cx lesion is 20% stenosed with 20% stenosed side branch in 1st Mrg.  --------------------  The left ventricular ejection fraction is 50-55% by visual estimate. LV end diastolic pressure is normal.  There is no aortic valve stenosis.  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    Reproductive/Obstetrics                             Anesthesia Physical Anesthesia  Plan  ASA: III  Anesthesia Plan: General   Post-op Pain Management:    Induction: Intravenous  PONV Risk Score and Plan: 1 and Treatment may vary due to age or medical condition  Airway Management Planned: Nasal Cannula and Natural Airway  Additional Equipment:   Intra-op Plan:   Post-operative Plan:   Informed Consent: I have reviewed the patients History and Physical, chart, labs and discussed the procedure including the risks, benefits and alternatives for the proposed anesthesia with the patient or authorized representative who has indicated his/her understanding and acceptance.     Dental advisory given  Plan Discussed with: CRNA and Surgeon  Anesthesia Plan Comments:         Anesthesia Quick Evaluation

## 2019-09-17 NOTE — H&P (Signed)
@LOGO @   Primary Care Physician:  [name], MD Primary Gastroenterologist:  Dr. [name]  Pre-Procedure History & Physical: HPI:  [name] is a 64 y.o. male here for here for EGD to further evaluate/treat esophageal dysphagia.  History of Schatzki's ring.  Recent cardiac catheter reassess coronary anatomy.  Medical therapy recommended.  Discussed with anesthesia.  Reflux well controlled on twice daily Protonix.  Past Medical History:  Diagnosis Date  . Anxiety   . Anxiety and depression   . ASCVD (arteriosclerotic cardiovascular disease)    a. s/p PTCA to LCx in 1993 b. BMS to RCA in 01/2012 with residual 80% OM2 stenosis and medical management recommended c. 08/2019: cath showing patent stents with occluded OM2 --> medical management.   . Asthma   . CAD (coronary artery disease)   . Carcinoma in situ of colon 2004   rectal polyp  . Colitis, ischemic ([place]) 2011  . COPD (chronic obstructive pulmonary disease) (HCC)    mild ;excercise induced hypoxemia by cp stress test ;asthma ,bronchitis,  . Depression   . Diverticulosis   . Gastritis 12/30/10   EGD Dr [name]  . GERD (gastroesophageal reflux disease)   . GI bleed   . H. pylori infection 2004   treated  . Hemorrhoids   . Hiatal hernia   . Hyperlipidemia   . Hypertension   . Inflammatory polyps of colon with rectal bleeding ([place])   . Leukocytosis    [name]  . Lung nodule 07/16/2011  . Myocardial infarction [facility])    age 56  . Restless leg syndrome   . Schatzki's ring   . Sleep apnea    does not use CPAP:cannot tolerate, PCP aware  . Syncope   . Tobacco abuse    50 pack years continuing at one halp pack daily  . Tubular adenoma of colon 06/2009   Colonosocpy Dr [name]    Past Surgical History:  Procedure Laterality Date  . BIOPSY  12/19/2017   Procedure: BIOPSY;  Surgeon: [name], MD;  [facility];  Service: Endoscopy;;  gastric  . CARDIAC CATHETERIZATION    . CHOLECYSTECTOMY   2004  . COLONOSCOPY  06/2009   normal terminal ileum, segmental mild inflammation of sigmoid colon (bx unremarkable), polyp, tubular adenoma  . COLONOSCOPY N/A 07/31/2013   Dr.Derriona Branscom- redundant anal canal hemorrhoids, colonic diverticulosis, tubular adenoma  . COLONOSCOPY W/ POLYPECTOMY  2004   rectal polyp with carcinoma in situ removed via colonoscopy  . COLONOSCOPY WITH PROPOFOL N/A 09/15/2015   Dr. [name]: Scattered diverticula throughout the colon, 2 sessile polyps found in the descending colon and cecum, 5 mm in size.  Cecal polyp was sessile serrated polyp, descending colon polyp was a tubular adenoma.  He had a abnormal perianal exam along with grade 3 hemorrhoids.  Surveillance exam recommended for 5-year follow-up.  . COLONOSCOPY WITH PROPOFOL N/A 12/19/2017   one 5 mm polyp in ascending colon and 1 cm sessile polyp in ascending s/p removal. Tubular adenoma. internal hemorrhoids  . CORONARY ANGIOPLASTY WITH STENT PLACEMENT  01/26/2012   "1; total is now 2"  . ESOPHAGOGASTRODUODENOSCOPY  02/2009   query Barrett's but bx negative  . ESOPHAGOGASTRODUODENOSCOPY  12/30/2010   [name] Elya Tarquinio,gastritis, dilated 13F, sm HH, 1 small ulcer, Duodenal erosions, benign bx  . ESOPHAGOGASTRODUODENOSCOPY (EGD) WITH ESOPHAGEAL DILATION N/A 09/12/2012   [id number] Schatzki's ring s/p Maloney dilator. Small hiatal hernia. negative path  . ESOPHAGOGASTRODUODENOSCOPY (EGD) WITH ESOPHAGEAL DILATION N/A 07/31/2013  Dr. [name]- normal egd, s/p [facility] dilation empirically. Normal small bowel biopsies   . ESOPHAGOGASTRODUODENOSCOPY (EGD) WITH PROPOFOL N/A 09/15/2015   Dr. [name]: Medium sized hiatal hernia, normal-appearing esophagus status post empiric dilation  . ESOPHAGOGASTRODUODENOSCOPY (EGD) WITH PROPOFOL N/A 12/19/2017   erosive esophagitis s/p dilation, erosive gastropathy, normal duodenum  . FLEXIBLE SIGMOIDOSCOPY  12/30/2010    [name] Irwin Toran,; internal hemorrhoids, anal papilla  . HAND SURGERY     surgical  intervention for injury of the fingers of the left hand many years ago  . heart stent    . HEMORRHOID BANDING     Dr. [name]  . LEFT HEART CATH AND CORONARY ANGIOGRAPHY N/A 08/29/2019   Procedure: LEFT HEART CATH AND CORONARY ANGIOGRAPHY;  Surgeon: [name], MD;  Location: [place] [facility];  Service: Cardiovascular;  Laterality: N/A;  . LEFT HEART CATHETERIZATION WITH CORONARY ANGIOGRAM N/A 01/26/2012   Procedure: LEFT HEART CATHETERIZATION WITH CORONARY ANGIOGRAM;  Surgeon: [name], MD;  Location: [facility] CATH LAB;  Service: Cardiovascular;  Laterality: N/A;  . [name] DILATION N/A 09/15/2015   Procedure: [name] DILATION;  Surgeon: [name], MD;  [facility];  Service: Endoscopy;  Laterality: N/A;  . [name] DILATION N/A 12/19/2017   Procedure: [name] DILATION;  Surgeon: [name], MD;  [facility];  Service: Endoscopy;  Laterality: N/A;  . NASAL SEPTOPLASTY W/ TURBINOPLASTY  10/06/2011   Procedure: NASAL SEPTOPLASTY WITH TURBINATE REDUCTION;  Surgeon: Izora Gala, MD;  Location: [place];  Service: ENT;  Laterality: Bilateral;  . PERCUTANEOUS CORONARY STENT INTERVENTION (PCI-S) N/A 01/26/2012   Procedure: PERCUTANEOUS CORONARY STENT INTERVENTION (PCI-S);  Surgeon: [name], MD;  Location: [facility] CATH LAB;  Service: Cardiovascular;  Laterality: N/A;  . POLYPECTOMY  09/15/2015   Procedure: POLYPECTOMY;  Surgeon: [name], MD;  [facility];  Service: Endoscopy;;  Cecal polyp removed via cold snare/ Descending colon polyp removed via cold snare  . POLYPECTOMY  12/19/2017   Procedure: POLYPECTOMY;  Surgeon: [name], MD;  [facility];  Service: Endoscopy;;  colon    Prior to Admission medications   Medication Sig Start Date End Date Taking? Authorizing Provider  albuterol (PROVENTIL) (2.5 MG/3ML) 0.083% nebulizer solution Take 3 mLs (2.5 mg total) by nebulization every 6 (six) hours as needed for wheezing or shortness of  breath. 09/13/19  Yes Strader, [place] M, PA-C  ALPRAZolam (XANAX) 1 MG tablet TAKE 1 TABLET (1 MG TOTAL) BY MOUTH 4 (FOUR) TIMES DAILY. 09/05/19  Yes Cloria Spring, MD  aspirin EC 81 MG tablet Take 81 mg by mouth daily.   Yes [provider]  atorvastatin (LIPITOR) 80 MG tablet TAKE 1 TABLET BY MOUTH EVERY DAY Patient taking differently: Take 80 mg by mouth daily.  05/01/19  Yes BranchAlphonse Guild, MD  budesonide-formoterol (SYMBICORT) 80-4.5 MCG/ACT inhaler Inhale 2 puffs into the lungs 2 (two) times daily. 07/22/16  Yes [name], MD  dicyclomine (BENTYL) 10 MG capsule Take 10 mg by mouth 4 (four) times daily. 08/11/19  Yes [provider]  FLUoxetine (PROZAC) 20 MG capsule Take 1 capsule (20 mg total) by mouth daily. Patient taking differently: Take 20 mg by mouth daily. Take with 40 mg to equal 60 mg daily 01/26/19  Yes Cloria Spring, MD  FLUoxetine (PROZAC) 40 MG capsule TAKE 1 CAPSULE BY MOUTH EVERY DAY Patient taking differently: Take 40 mg by mouth daily. Take with 20 mg to  equal 60 mg daily 06/19/19  Yes Cloria Spring, MD  fluticasone [facility]) 50 MCG/ACT nasal spray Place 2 sprays into both nostrils daily. Patient taking differently: Place 2 sprays into both nostrils daily as needed for allergies.  10/19/16  Yes [name], MD  gabapentin (NEURONTIN) 300 MG capsule Take 2 capsules (600 mg total) by mouth 3 (three) times daily. 01/26/19  Yes Cloria Spring, MD  HYDROcodone-acetaminophen (NORCO) 7.5-325 MG tablet Take 1 tablet by mouth 2 (two) times daily as needed for moderate pain. 06/21/19  Yes Luking, Elayne Snare, MD  hydrocortisone (ANUSOL-HC) 2.5 % rectal cream Place 1 application rectally 2 (two) times daily. 05/04/19  Yes Annitta Needs, NP  isosorbide mononitrate (IMDUR) 30 MG 24 hr tablet Take 1 tablet (30 mg total) by mouth daily. 08/29/19 08/28/20 Yes Duke, Tami Lin, PA  lipase/protease/amylase (CREON) 36000 UNITS CPEP capsule Take 2 capsules (72,000 Units  total) by mouth 3 (three) times daily with meals. 1 with snacks 05/04/19  Yes Annitta Needs, NP  lisinopril (ZESTRIL) 2.5 MG tablet TAKE 1 TABLET (2.5 MG TOTAL) BY MOUTH DAILY. **STOP VERAPAMIL** Patient taking differently: Take 2.5 mg by mouth daily.  07/19/19  Yes Luking, Elayne Snare, MD  meloxicam (MOBIC) 15 MG tablet TAKE 1 TABLET BY MOUTH EVERY DAY Patient taking differently: Take 15 mg by mouth daily.  07/19/19  Yes [name], MD  metoprolol succinate (TOPROL-XL) 50 MG 24 hr tablet Take 1 tablet (50 mg total) by mouth daily. 11/20/18  Yes Strader, [place] M, PA-C  nitroGLYCERIN (NITROSTAT) 0.4 MG SL tablet Place 0.4 mg under the tongue every 5 (five) minutes as needed for chest pain.   Yes [provider]  OLANZapine (ZYPREXA) 10 MG tablet Take 1 tablet (10 mg total) by mouth at bedtime. 01/26/19 01/26/20 Yes Cloria Spring, MD  pantoprazole (PROTONIX) 40 MG tablet Take 1 tablet (40 mg total) by mouth 2 (two) times daily before a meal. 04/20/19  Yes Carlis Stable, NP  rOPINIRole (REQUIP) 2 MG tablet TAKE 1 TABLET BY MOUTH DAILY AT BEDTIME Patient taking differently: Take 2 mg by mouth at bedtime.  05/14/19  Yes Luking, Elayne Snare, MD  temazepam (RESTORIL) 30 MG capsule Take 1 capsule (30 mg total) by mouth at bedtime as needed for sleep. Patient taking differently: Take 30 mg by mouth at bedtime.  01/26/19  Yes Cloria Spring, MD  TRELEGY ELLIPTA 100-62.5-25 MCG/INH AEPB Inhale 1 puff into the lungs daily. 09/13/19  Yes Strader, [place], PA-C  amLODipine (NORVASC) 5 MG tablet Take 5 mg by mouth daily.   04/13/19  [provider]    Allergies as of 08/07/2019  . (No Known Allergies)    Family History  Problem Relation Age of Onset  . Lung disease Father        deceased, black lung  . Heart disease Mother        blood clots  . Depression Mother   . Cancer Paternal Uncle        unknown type  . Cancer Maternal Aunt        unknown type  . Kidney failure Maternal Uncle   .  Hypertension Brother   . Colon cancer Neg Hx   . ADD / ADHD Neg Hx   . Alcohol abuse Neg Hx   . Drug abuse Neg Hx   . Anxiety disorder Neg Hx   . Bipolar disorder Neg Hx   . Dementia Neg  Hx   . OCD Neg Hx   . Paranoid behavior Neg Hx   . Schizophrenia Neg Hx   . Physical abuse Neg Hx   . Sexual abuse Neg Hx   . Seizures Neg Hx     Social History   Socioeconomic History  . Marital status: Married    Spouse name: Not on file  . Number of children: 3  . Years of education: Not on file  . Highest education level: Not on file  Occupational History  . Occupation: disable    Employer: RETIRED    Comment: [employer]  Tobacco Use  . Smoking status: Current Some Day Smoker    Packs/day: 0.50    Years: 30.00    Pack years: 15.00    Types: Cigarettes    Start date: 07/31/1969  . Smokeless tobacco: Former Systems developer  . Tobacco comment: cutting back trying to quit  Substance and Sexual Activity  . Alcohol use: Yes    Comment: Drinks a beer occasionally  . Drug use: No  . Sexual activity: Not Currently  Other Topics Concern  . Not on file  Social History Narrative   3 stepchildren   Social Determinants of Health   Financial Resource Strain:   . Difficulty of Paying Living Expenses:   Food Insecurity:   . Worried About Charity fundraiser in the Last Year:   . Arboriculturist in the Last Year:   Transportation Needs:   . Film/video editor (Medical):   Marland Kitchen Lack of Transportation (Non-Medical):   Physical Activity:   . Days of Exercise per Week:   . Minutes of Exercise per Session:   Stress:   . Feeling of Stress :   Social Connections:   . Frequency of Communication with Friends and Family:   . Frequency of Social Gatherings with Friends and Family:   . Attends Religious Services:   . Active Member of Clubs or Organizations:   . Attends Archivist Meetings:   Marland Kitchen Marital Status:   Intimate Partner Violence:   . Fear of Current or Ex-Partner:   . Emotionally Abused:    Marland Kitchen Physically Abused:   . Sexually Abused:     Review of Systems: See HPI, otherwise negative ROS  Physical Exam: BP 130/86   Pulse 68   Temp 98 F (36.7 C) (Oral)   Resp 18   SpO2 97%  General:   Alert,  Well-developed, well-nourished, pleasant and cooperative in NAD . Mouth:  No deformity or lesions. Neck:  Supple; no masses or thyromegaly. No significant cervical adenopathy. Lungs:  Clear throughout to auscultation.   No wheezes, crackles, or rhonchi. No acute distress. Heart:  Regular rate and rhythm; no murmurs, clicks, rubs,  or gallops. Abdomen: Non-distended, normal bowel sounds.  Soft and nontender without appreciable mass or hepatosplenomegaly.  Pulses:  Normal pulses noted. Extremities:  Without clubbing or edema.  Impression/Plan: 64 year old gentleman with longstanding GERD Schatzki's ring/hiatal hernia.  Presents with recurrent esophageal dysphagia.  EGD with possible esophageal dilation as feasible/appropriate today per plan.  The risks, benefits, limitations, alternatives and imponderables have been reviewed with the patient. Potential for esophageal dilation, biopsy, etc. have also been reviewed.  Questions have been answered. All parties agreeable.     Notice: This dictation was prepared with Dragon dictation along with smaller phrase technology. Any transcriptional errors that result from this process are unintentional and may not be corrected upon review.

## 2019-09-17 NOTE — Transfer of Care (Signed)
Immediate Anesthesia Transfer of Care Note  Patient: KOUPER SPINELLA  Procedure(s) Performed: ESOPHAGOGASTRODUODENOSCOPY (EGD) WITH PROPOFOL (N/A ) MALONEY DILATION (N/A )  Patient Location: PACU  Anesthesia Type:MAC and General  Level of Consciousness: awake, alert  and oriented  Airway & Oxygen Therapy: Patient Spontanous Breathing  Post-op Assessment: Report given to RN, Post -op Vital signs reviewed and stable and Patient moving all extremities X 4  Post vital signs: Reviewed and stable  Last Vitals:  Vitals Value Taken Time  BP 114/76 09/17/19 1400  Temp    Pulse 72 09/17/19 1405  Resp 20 09/17/19 1405  SpO2 94 % 09/17/19 1405  Vitals shown include unvalidated device data.  Last Pain:  Vitals:   09/17/19 1400  TempSrc:   PainSc: (P) 0-No pain         Complications: No apparent anesthesia complications

## 2019-09-17 NOTE — Anesthesia Postprocedure Evaluation (Signed)
Anesthesia Post Note  Patient: [name]  Procedure(s) Performed: ESOPHAGOGASTRODUODENOSCOPY (EGD) WITH PROPOFOL (N/A ) MALONEY DILATION (N/A )  Patient location during evaluation: Endoscopy Anesthesia Type: General Level of consciousness: awake and alert Pain management: pain level controlled Vital Signs Assessment: post-procedure vital signs reviewed and stable Respiratory status: spontaneous breathing, nonlabored ventilation, respiratory function stable and patient connected to nasal cannula oxygen Cardiovascular status: blood pressure returned to baseline and stable Postop Assessment: no apparent nausea or vomiting Anesthetic complications: no     Last Vitals:  Vitals:   09/17/19 1425 09/17/19 1428  BP: 128/89 129/82  Pulse: 64 65  Resp: 18 18  Temp:  36.6 C  SpO2: 95% 93%    Last Pain:  Vitals:   09/17/19 1428  TempSrc: Oral  PainSc: 0-No pain                 Talitha Givens

## 2019-09-17 NOTE — Op Note (Signed)
[facility] Patient Name: [name] Procedure Date: 09/17/2019 1:29 PM MRN: [id number] Date of Birth: Jan 11, 1956 Attending MD: Norvel Richards , MD CSN: [id number] Age: 64 Admit Type: Outpatient Procedure:                Upper GI endoscopy Indications:              Dysphagia Providers:                Norvel Richards, MD, Jeanann Lewandowsky. Sharon Seller, RN,                            Raphael Gibney, Technician Referring MD:              Medicines:                Propofol per Anesthesia Complications:            No immediate complications. Estimated Blood Loss:     Estimated blood loss: none. Procedure:                Pre-Anesthesia Assessment:                           - Prior to the procedure, a History and Physical                            was performed, and patient medications and                            allergies were reviewed. The patient's tolerance of                            previous anesthesia was also reviewed. The risks                            and benefits of the procedure and the sedation                            options and risks were discussed with the patient.                            All questions were answered, and informed consent                            was obtained. Prior Anticoagulants: The patient has                            taken no previous anticoagulant or antiplatelet                            agents. ASA Grade Assessment: II - A patient with                            mild systemic disease. After reviewing the risks  and benefits, the patient was deemed in                            satisfactory condition to undergo the procedure.                           After obtaining informed consent, the endoscope was                            passed under direct vision. Throughout the                            procedure, the patient's blood pressure, pulse, and                            oxygen saturations were  monitored continuously. The                            [id number]) scope was introduced through the                            mouth, and advanced to the second part of duodenum.                            The upper GI endoscopy was accomplished without                            difficulty. The patient tolerated the procedure                            well. Scope In: 1:45:55 PM Scope Out: 1:53:01 PM Total Procedure Duration: 0 hours 7 minutes 6 seconds  Findings:      A non-obstructing Schatzki ring was found at the gastroesophageal       junction.      A medium-sized hiatal hernia was present. Otherwise, normal-appearing       stomach.      The exam was otherwise without abnormality.      The duodenal bulb and second portion of the duodenum were normal.       Esophagus appeared normal otherwise. The scope was withdrawn. Dilation       was performed with a Maloney dilator with mild resistance at 56 Fr. The       scope was withdrawn. Dilation was performed with a Maloney dilator with       mild resistance at 74 Fr. The dilation site was examined following       endoscope reinsertion and showed no change. Estimated blood loss: none. Impression:               - Non-obstructing Schatzki ring. Status post                            dilation.                           - Medium-sized hiatal hernia.                           -  The stomach appeared normal                           - Normal duodenal bulb and second portion of the                            duodenum.                           - No specimens collected. Moderate Sedation:      Moderate (conscious) sedation was personally administered by an       anesthesia professional. The following parameters were monitored: oxygen       saturation, heart rate, blood pressure, respiratory rate, EKG, adequacy       of pulmonary ventilation, and response to care. Recommendation:           - Patient has a contact number available for                             emergencies. The signs and symptoms of potential                            delayed complications were discussed with the                            patient. Return to normal activities tomorrow.                            Written discharge instructions were provided to the                            patient.                           - Advance diet as tolerated. Continue Protonix 40                            mg twice daily. Office visit with [place] in 6 months Procedure Code(s):        --- Professional ---                           [phone], Esophagogastroduodenoscopy, flexible,                            transoral; diagnostic, including collection of                            specimen(s) by brushing or washing, when performed                            (separate procedure)                           [id number], Dilation of esophagus, by unguided sound or  bougie, single or multiple passes Diagnosis Code(s):        --- Professional ---                           K22.2, Esophageal obstruction                           K44.9, Diaphragmatic hernia without obstruction or                            gangrene                           R13.10, Dysphagia, unspecified CPT copyright 2019 American Medical Association. All rights reserved. The codes documented in this report are preliminary and upon coder review may  be revised to meet current compliance requirements. [name]. Richy Spradley, MD Norvel Richards, MD 09/17/2019 2:04:15 PM This report has been signed electronically. Number of Addenda: 0

## 2019-09-21 DIAGNOSIS — G4700 Insomnia, unspecified: Secondary | ICD-10-CM | POA: Diagnosis not present

## 2019-09-21 DIAGNOSIS — K649 Unspecified hemorrhoids: Secondary | ICD-10-CM | POA: Diagnosis not present

## 2019-09-21 DIAGNOSIS — K219 Gastro-esophageal reflux disease without esophagitis: Secondary | ICD-10-CM | POA: Diagnosis not present

## 2019-09-21 DIAGNOSIS — J449 Chronic obstructive pulmonary disease, unspecified: Secondary | ICD-10-CM | POA: Diagnosis not present

## 2019-09-21 DIAGNOSIS — Z79899 Other long term (current) drug therapy: Secondary | ICD-10-CM | POA: Diagnosis not present

## 2019-09-21 DIAGNOSIS — K2970 Gastritis, unspecified, without bleeding: Secondary | ICD-10-CM | POA: Diagnosis not present

## 2019-09-21 DIAGNOSIS — Z7951 Long term (current) use of inhaled steroids: Secondary | ICD-10-CM | POA: Diagnosis not present

## 2019-09-21 DIAGNOSIS — Z7982 Long term (current) use of aspirin: Secondary | ICD-10-CM | POA: Diagnosis not present

## 2019-09-21 DIAGNOSIS — M1990 Unspecified osteoarthritis, unspecified site: Secondary | ICD-10-CM | POA: Diagnosis not present

## 2019-09-21 DIAGNOSIS — I10 Essential (primary) hypertension: Secondary | ICD-10-CM | POA: Diagnosis not present

## 2019-09-21 DIAGNOSIS — G2581 Restless legs syndrome: Secondary | ICD-10-CM | POA: Diagnosis not present

## 2019-09-21 DIAGNOSIS — E8809 Other disorders of plasma-protein metabolism, not elsewhere classified: Secondary | ICD-10-CM | POA: Diagnosis not present

## 2019-09-21 DIAGNOSIS — F339 Major depressive disorder, recurrent, unspecified: Secondary | ICD-10-CM | POA: Diagnosis not present

## 2019-09-21 DIAGNOSIS — G629 Polyneuropathy, unspecified: Secondary | ICD-10-CM | POA: Diagnosis not present

## 2019-09-21 DIAGNOSIS — F419 Anxiety disorder, unspecified: Secondary | ICD-10-CM | POA: Diagnosis not present

## 2019-09-21 DIAGNOSIS — Z791 Long term (current) use of non-steroidal anti-inflammatories (NSAID): Secondary | ICD-10-CM | POA: Diagnosis not present

## 2019-09-21 DIAGNOSIS — E785 Hyperlipidemia, unspecified: Secondary | ICD-10-CM | POA: Diagnosis not present

## 2019-09-21 DIAGNOSIS — Z720 Tobacco use: Secondary | ICD-10-CM | POA: Diagnosis not present

## 2019-09-25 ENCOUNTER — Other Ambulatory Visit: Payer: Self-pay | Admitting: Family Medicine

## 2019-09-25 ENCOUNTER — Telehealth: Payer: Self-pay | Admitting: Family Medicine

## 2019-09-25 MED ORDER — HYDROCODONE-ACETAMINOPHEN 7.5-325 MG PO TABS: 1.0000 | ORAL_TABLET | Freq: Two times a day (BID) | ORAL | 0 refills | Status: DC | PRN

## 2019-09-25 NOTE — Telephone Encounter (Signed)
Pt has med check on 4/19 and is needing refill on his HYDROcodone-acetaminophen ([place]) 7.5-325 MG tablet.   [facility][id number] Angelina Sheriff, Craig Haughton.

## 2019-09-25 NOTE — Telephone Encounter (Signed)
Last seen 06/21/19 for pain management. Please advise. Thank you

## 2019-09-26 ENCOUNTER — Other Ambulatory Visit (HOSPITAL_COMMUNITY): Payer: Self-pay | Admitting: Psychiatry

## 2019-09-26 ENCOUNTER — Other Ambulatory Visit: Payer: Self-pay | Admitting: Family Medicine

## 2019-09-26 MED ORDER — HYDROCODONE-ACETAMINOPHEN 7.5-325 MG PO TABS: 1.0000 | ORAL_TABLET | Freq: Two times a day (BID) | ORAL | 0 refills | Status: DC | PRN

## 2019-09-26 NOTE — Telephone Encounter (Signed)
Left message to return call 

## 2019-09-26 NOTE — Telephone Encounter (Signed)
Call for appt

## 2019-09-26 NOTE — Telephone Encounter (Signed)
Prescription was sent as requested please follow-up as planned

## 2019-10-01 ENCOUNTER — Telehealth (INDEPENDENT_AMBULATORY_CARE_PROVIDER_SITE_OTHER): Payer: Medicare PPO | Admitting: Family Medicine

## 2019-10-01 ENCOUNTER — Other Ambulatory Visit: Payer: Self-pay

## 2019-10-01 ENCOUNTER — Telehealth: Payer: Self-pay | Admitting: *Deleted

## 2019-10-01 DIAGNOSIS — G894 Chronic pain syndrome: Secondary | ICD-10-CM

## 2019-10-01 DIAGNOSIS — J438 Other emphysema: Secondary | ICD-10-CM | POA: Diagnosis not present

## 2019-10-01 DIAGNOSIS — I10 Essential (primary) hypertension: Secondary | ICD-10-CM

## 2019-10-01 MED ORDER — HYDROCODONE-ACETAMINOPHEN 7.5-325 MG PO TABS: 1.0000 | ORAL_TABLET | Freq: Two times a day (BID) | ORAL | 0 refills | Status: DC | PRN

## 2019-10-01 MED ORDER — HYDROCODONE-ACETAMINOPHEN 7.5-300 MG PO TABS: ORAL_TABLET | ORAL | 0 refills | Status: DC

## 2019-10-01 NOTE — Progress Notes (Signed)
Subjective:    Patient ID: ALDRICK AVERILL, male    DOB: Jul 10, 1955, 64 y.o.   MRN: [id number]  Hypertension This is a chronic problem. The current episode started more than 1 year ago. Risk factors for coronary artery disease include dyslipidemia and male gender. Treatments tried: imdur,zestril,toprolol.   Patient states he is still weak from recent hospitalization.  Patient was in the hospital recently for cardiac related issues but actually doing much better now He does have COPD is stable This patient was seen today for chronic pain  The medication list was reviewed and updated.   -Compliance with medication: Relates compliance with medicine  - Number patient states they take daily: Takes no more than 2 a day  -when was the last dose patient took?  Last evening  The patient was advised the importance of maintaining medication and not using illegal substances with these.  Here for refills and follow up  The patient was educated that we can provide 3 monthly scripts for their medication, it is their responsibility to follow the instructions.  Side effects or complications from medications: Denies side effects  Patient is aware that pain medications are meant to minimize the severity of the pain to allow their pain levels to improve to allow for better function. They are aware of that pain medications cannot totally remove their pain.  Due for UDT ( at least once per year) : Later this year       Review of Systems  Virtual Visit via Video Note  I connected with Alanson Aly on 10/01/19 at  9:00 AM EDT by a video enabled telemedicine application and verified that I am speaking with the correct person using two identifiers.  Location: Patient: home Provider: office   I discussed the limitations of evaluation and management by telemedicine and the availability of in person appointments. The patient expressed understanding and agreed to proceed.  History of Present  Illness:    Observations/Objective:   Assessment and Plan:   Follow Up Instructions:    I discussed the assessment and treatment plan with the patient. The patient was provided an opportunity to ask questions and all were answered. The patient agreed with the plan and demonstrated an understanding of the instructions.   The patient was advised to call back or seek an in-person evaluation if the symptoms worsen or if the condition fails to improve as anticipated.  I provided 20 minutes of non-face-to-face time during this encounter.        Objective:   Physical Exam  Today's visit was via telephone Physical exam was not possible for this visit       Assessment & Plan:  1. Essential hypertension Blood pressure keeps under good control medicine patient encouraged to quit smoking  2. Other emphysema ([place]) Continue lung medicines patient encouraged to quit smoking  3. Chronic pain syndrome 3 scripts were sent in drug registry checked pain medicine does allow her to function better The patient was seen in followup for chronic pain. A review over at their current pain status was discussed. Drug registry was checked. Prescriptions were given.  Regular follow-up recommended. Discussion was held regarding the importance of compliance with medication as well as pain medication contract.  Patient was informed that medication may cause drowsiness and should not be combined  with other medications/alcohol or street drugs. If the patient feels medication is causing altered alertness then do not drive or operate dangerous equipment. Next visit will be in  person

## 2019-10-01 NOTE — Telephone Encounter (Signed)
[name], nuzzi are scheduled for a virtual visit with your provider today.    Just as we do with appointments in the office, we must obtain your consent to participate.  Your consent will be active for this visit and any virtual visit you may have with one of our providers in the next 365 days.    If you have a MyChart account, I can also send a copy of this consent to you electronically.  All virtual visits are billed to your insurance company just like a traditional visit in the office.  As this is a virtual visit, video technology does not allow for your provider to perform a traditional examination.  This may limit your provider's ability to fully assess your condition.  If your provider identifies any concerns that need to be evaluated in person or the need to arrange testing such as labs, EKG, etc, we will make arrangements to do so.    Although advances in technology are sophisticated, we cannot ensure that it will always work on either your end or our end.  If the connection with a video visit is poor, we may have to switch to a telephone visit.  With either a video or telephone visit, we are not always able to ensure that we have a secure connection.   I need to obtain your verbal consent now.   Are you willing to proceed with your visit today?   GYAN POPPEN has provided verbal consent on 10/01/2019 for a virtual visit (video or telephone).   Mitzie Na, RN 10/01/2019  9:13 AM

## 2019-10-01 NOTE — Telephone Encounter (Signed)
Pt had appt today.

## 2019-10-04 NOTE — Telephone Encounter (Signed)
Per Provider: Call for appt.            Call attempted LVM

## 2019-10-05 ENCOUNTER — Other Ambulatory Visit (HOSPITAL_COMMUNITY): Payer: Self-pay | Admitting: Psychiatry

## 2019-10-08 NOTE — Telephone Encounter (Signed)
PATIENT'S WIFE CALLED STATED HE'S BEEN OUT OF MEDICATION SINCE Friday  WHEN RX SENT E-SCRIBE  REFILL REQUEST ALPRAZolam (XANAX) 1 MG tablet. PATIENT'S NEXT  APPOINTMENT IS 10/23/2019

## 2019-10-08 NOTE — Telephone Encounter (Signed)
sent 

## 2019-10-15 ENCOUNTER — Ambulatory Visit: Payer: Medicare PPO | Admitting: Family Medicine

## 2019-10-16 ENCOUNTER — Other Ambulatory Visit (HOSPITAL_COMMUNITY): Payer: Self-pay | Admitting: Psychiatry

## 2019-10-22 ENCOUNTER — Ambulatory Visit: Payer: Medicare PPO | Admitting: Family Medicine

## 2019-10-22 ENCOUNTER — Encounter: Payer: Self-pay | Admitting: Family Medicine

## 2019-10-22 ENCOUNTER — Other Ambulatory Visit: Payer: Self-pay

## 2019-10-22 VITALS — BP 130/94 | Temp 96.9°F | Wt 170.4 lb

## 2019-10-22 DIAGNOSIS — G894 Chronic pain syndrome: Secondary | ICD-10-CM | POA: Diagnosis not present

## 2019-10-22 DIAGNOSIS — J438 Other emphysema: Secondary | ICD-10-CM | POA: Diagnosis not present

## 2019-10-22 DIAGNOSIS — I10 Essential (primary) hypertension: Secondary | ICD-10-CM

## 2019-10-22 DIAGNOSIS — L570 Actinic keratosis: Secondary | ICD-10-CM | POA: Diagnosis not present

## 2019-10-22 DIAGNOSIS — R251 Tremor, unspecified: Secondary | ICD-10-CM

## 2019-10-22 DIAGNOSIS — Z125 Encounter for screening for malignant neoplasm of prostate: Secondary | ICD-10-CM

## 2019-10-22 MED ORDER — HYDROCODONE-ACETAMINOPHEN 7.5-325 MG PO TABS: ORAL_TABLET | ORAL | 0 refills | Status: DC

## 2019-10-22 MED ORDER — LISINOPRIL 5 MG PO TABS: 5.0000 mg | ORAL_TABLET | Freq: Every day | ORAL | 1 refills | Status: DC

## 2019-10-22 MED ORDER — HYDROCODONE-ACETAMINOPHEN 7.5-325 MG PO TABS: 1.0000 | ORAL_TABLET | Freq: Two times a day (BID) | ORAL | 0 refills | Status: DC | PRN

## 2019-10-22 NOTE — Progress Notes (Signed)
   Subjective:    Patient ID: [name], male    DOB: 1955/11/05, 64 y.o.   MRN: [id number]  HPI Patient comes in today with concerns about his hands shaking.  Patient states this has been going ton for about a month, and bothers him in the morning but once he wakes up and takes his medication is seems to stop.  Patient does have tremors in his hands but he states once he takes his Xanax it trip settles down he denies shakes tremors or ataxia during the day Patient on a large amount of medicine because of depression issues Also on pain medication Denies abusing it. No other concerns.  This patient was seen today for chronic pain  The medication list was reviewed and updated.   -Compliance with medication: Relates compliance with medicine  - Number patient states they take daily: Typically 2 a day  -when was the last dose patient took?  Yesterday  The patient was advised the importance of maintaining medication and not using illegal substances with these.  Here for refills and follow up  The patient was educated that we can provide 3 monthly scripts for their medication, it is their responsibility to follow the instructions.  Side effects or complications from medications: Denies side effects states he tolerates it well does help him function  Patient is aware that pain medications are meant to minimize the severity of the pain to allow their pain levels to improve to allow for better function. They are aware of that pain medications cannot totally remove their pain.  Due for UDT ( at least once per year) : Due later this year       Review of Systems Please see above    Objective:   Physical Exam  Lungs clear respiratory rate normal no pill-rolling no cogwheeling no significant tremor on exam Patient has a actinic keratosis on his arms     Assessment & Plan:  1. Tremor Patient has fine tremor in the morning but gets better with Xanax I find no evidence of  Parkinson's at this point we will monitor  2. Essential hypertension Blood pressure subpar control bump up dose of Zestril to 5 mg daily - Basic metabolic panel  3. Other emphysema ([place]) Patient is cutting back on smoking he has been encouraged to quit smoking altogether to lessen his risk of severe issues  4. Chronic pain syndrome The patient was seen in followup for chronic pain. A review over at their current pain status was discussed. Drug registry was checked. Prescriptions were given.  Regular follow-up recommended. Discussion was held regarding the importance of compliance with medication as well as pain medication contract.  Patient was informed that medication may cause drowsiness and should not be combined  with other medications/alcohol or street drugs. If the patient feels medication is causing altered alertness then do not drive or operate dangerous equipment.  Drug registry checked Pain medicine does allow him to function better   5. Actinic keratosis Referral to dermatology for areas on his right arm that are not healing - Ambulatory referral to Dermatology  6. Screening for prostate cancer Screening for prostate cancer - PSA

## 2019-10-23 ENCOUNTER — Telehealth (INDEPENDENT_AMBULATORY_CARE_PROVIDER_SITE_OTHER): Payer: Medicare PPO | Admitting: Psychiatry

## 2019-10-23 ENCOUNTER — Encounter (HOSPITAL_COMMUNITY): Payer: Self-pay | Admitting: Psychiatry

## 2019-10-23 DIAGNOSIS — F418 Other specified anxiety disorders: Secondary | ICD-10-CM | POA: Diagnosis not present

## 2019-10-23 MED ORDER — FLUOXETINE HCL 20 MG PO CAPS
20.0000 mg | ORAL_CAPSULE | Freq: Every day | ORAL | 2 refills | Status: DC
Start: 2019-10-23 — End: 2020-02-07

## 2019-10-23 MED ORDER — GABAPENTIN 300 MG PO CAPS: 600.0000 mg | ORAL_CAPSULE | Freq: Three times a day (TID) | ORAL | 2 refills | Status: DC

## 2019-10-23 MED ORDER — FLUOXETINE HCL 40 MG PO CAPS: 40.0000 mg | ORAL_CAPSULE | Freq: Every day | ORAL | 2 refills | Status: DC

## 2019-10-23 MED ORDER — ALPRAZOLAM 1 MG PO TABS: 1.0000 mg | ORAL_TABLET | Freq: Four times a day (QID) | ORAL | 2 refills | Status: DC

## 2019-10-23 MED ORDER — OLANZAPINE 10 MG PO TABS: 10.0000 mg | ORAL_TABLET | Freq: Every day | ORAL | 2 refills | Status: DC

## 2019-10-23 NOTE — Progress Notes (Signed)
Virtual Visit via Telephone Note  I connected with [name] on 10/23/19 at  9:00 AM EDT by telephone and verified that I am speaking with the correct person using two identifiers.   I discussed the limitations, risks, security and privacy concerns of performing an evaluation and management service by telephone and the availability of in person appointments. I also discussed with the patient that there may be a patient responsible charge related to this service. The patient expressed understanding and agreed to proceed.     I discussed the assessment and treatment plan with the patient. The patient was provided an opportunity to ask questions and all were answered. The patient agreed with the plan and demonstrated an understanding of the instructions.   The patient was advised to call back or seek an in-person evaluation if the symptoms worsen or if the condition fails to improve as anticipated.  I provided 15 minutes of non-face-to-face time during this encounter.   [name], MD  [facility] MD/PA/NP OP Progress Note  10/23/2019 9:53 AM [name]  MRN:  [id number]  Chief Complaint:  Chief Complaint    Depression; Anxiety; Follow-up     HPI: This patient is a 64 year old married white male who lives with his wife and 2 grandchildren in [place].  He is on disability for coronary artery disease and COPD.  He used to work for [employer].  The patient returns for follow-up after long absence.  He was last seen about 9 months ago.  He is followed for depression anxiety and posttraumatic stress disorder.  He and his wife still have custody of their 2 grandchildren who are the children of their daughter who killed herself in 2018.  The grandchildren seem to be keeping him busy.  The patient states his mood is generally stable.  He is sleeping well.  He had to have another cardiac catheterization in March due to unstable angina.  He does note that he has a fine resting tremor in his hands.   This was observed by Dr. [name] yesterday.  He states it gets better once he takes his Xanax in the morning.  When asked about alcohol use he claims he drinks "1 or 2 beers occasionally."  I urged him to take the Xanax on a scheduled basis 3 times a day to avoid the tremor.  He denies serious depression or suicidal ideation and still feels that his medications are helpful Visit Diagnosis:    ICD-10-CM   1. Depression with anxiety  F41.8     Past Psychiatric History: none  Past Medical History:  Past Medical History:  Diagnosis Date  . Anxiety   . Anxiety and depression   . ASCVD (arteriosclerotic cardiovascular disease)    a. s/p PTCA to LCx in 1993 b. BMS to RCA in 01/2012 with residual 80% OM2 stenosis and medical management recommended c. 08/2019: cath showing patent stents with occluded OM2 --> medical management.   . Asthma   . CAD (coronary artery disease)   . Carcinoma in situ of colon 2004   rectal polyp  . Colitis, ischemic ([place]) 2011  . COPD (chronic obstructive pulmonary disease) (HCC)    mild ;excercise induced hypoxemia by cp stress test ;asthma ,bronchitis,  . Depression   . Diverticulosis   . Gastritis 12/30/10   EGD Dr [name]  . GERD (gastroesophageal reflux disease)   . GI bleed   . H. pylori infection 2004   treated  . Hemorrhoids   .  Hiatal hernia   . Hyperlipidemia   . Hypertension   . Inflammatory polyps of colon with rectal bleeding ([place])   . Leukocytosis    [name]  . Lung nodule 07/16/2011  . Myocardial infarction [facility])    age 44  . Restless leg syndrome   . Schatzki's ring   . Sleep apnea    does not use CPAP:cannot tolerate, PCP aware  . Syncope   . Tobacco abuse    50 pack years continuing at one halp pack daily  . Tubular adenoma of colon 06/2009   Colonosocpy Dr [name]    Past Surgical History:  Procedure Laterality Date  . BIOPSY  12/19/2017   Procedure: BIOPSY;  Surgeon: [name], MD;  [facility];  Service:  Endoscopy;;  gastric  . CARDIAC CATHETERIZATION    . CHOLECYSTECTOMY  2004  . COLONOSCOPY  06/2009   normal terminal ileum, segmental mild inflammation of sigmoid colon (bx unremarkable), polyp, tubular adenoma  . COLONOSCOPY N/A 07/31/2013   Dr.Rourk- redundant anal canal hemorrhoids, colonic diverticulosis, tubular adenoma  . COLONOSCOPY W/ POLYPECTOMY  2004   rectal polyp with carcinoma in situ removed via colonoscopy  . COLONOSCOPY WITH PROPOFOL N/A 09/15/2015   Dr. [name]: Scattered diverticula throughout the colon, 2 sessile polyps found in the descending colon and cecum, 5 mm in size.  Cecal polyp was sessile serrated polyp, descending colon polyp was a tubular adenoma.  He had a abnormal perianal exam along with grade 3 hemorrhoids.  Surveillance exam recommended for 5-year follow-up.  . COLONOSCOPY WITH PROPOFOL N/A 12/19/2017   one 5 mm polyp in ascending colon and 1 cm sessile polyp in ascending s/p removal. Tubular adenoma. internal hemorrhoids  . CORONARY ANGIOPLASTY WITH STENT PLACEMENT  01/26/2012   "1; total is now 2"  . ESOPHAGOGASTRODUODENOSCOPY  02/2009   query Barrett's but bx negative  . ESOPHAGOGASTRODUODENOSCOPY  12/30/2010   [name],gastritis, dilated 54F, sm HH, 1 small ulcer, Duodenal erosions, benign bx  . ESOPHAGOGASTRODUODENOSCOPY (EGD) WITH ESOPHAGEAL DILATION N/A 09/12/2012   [id number] Schatzki's ring s/p Maloney dilator. Small hiatal hernia. negative path  . ESOPHAGOGASTRODUODENOSCOPY (EGD) WITH ESOPHAGEAL DILATION N/A 07/31/2013   Dr. [name]- normal egd, s/p [facility] dilation empirically. Normal small bowel biopsies   . ESOPHAGOGASTRODUODENOSCOPY (EGD) WITH PROPOFOL N/A 09/15/2015   Dr. [name]: Medium sized hiatal hernia, normal-appearing esophagus status post empiric dilation  . ESOPHAGOGASTRODUODENOSCOPY (EGD) WITH PROPOFOL N/A 12/19/2017   erosive esophagitis s/p dilation, erosive gastropathy, normal duodenum  . ESOPHAGOGASTRODUODENOSCOPY (EGD) WITH PROPOFOL  N/A 09/17/2019   Procedure: ESOPHAGOGASTRODUODENOSCOPY (EGD) WITH PROPOFOL;  Surgeon: [name], MD;  [facility];  Service: Endoscopy;  Laterality: N/A;  2:45pm - pt knows to arrive at 12:45  . FLEXIBLE SIGMOIDOSCOPY  12/30/2010    [name],; internal hemorrhoids, anal papilla  . HAND SURGERY     surgical intervention for injury of the fingers of the left hand many years ago  . heart stent    . HEMORRHOID BANDING     Dr. [name]  . LEFT HEART CATH AND CORONARY ANGIOGRAPHY N/A 08/29/2019   Procedure: LEFT HEART CATH AND CORONARY ANGIOGRAPHY;  Surgeon: [name], MD;  Location: [place] CV LAB;  Service: Cardiovascular;  Laterality: N/A;  . LEFT HEART CATHETERIZATION WITH CORONARY ANGIOGRAM N/A 01/26/2012   Procedure: LEFT HEART CATHETERIZATION WITH CORONARY ANGIOGRAM;  Surgeon: [name], MD;  Location: [facility] CATH LAB;  Service: Cardiovascular;  Laterality: N/A;  .  MALONEY DILATION N/A 09/15/2015   Procedure: [name] DILATION;  Surgeon: [name], MD;  [facility];  Service: Endoscopy;  Laterality: N/A;  . [name] DILATION N/A 12/19/2017   Procedure: [name] DILATION;  Surgeon: [name], MD;  [facility];  Service: Endoscopy;  Laterality: N/A;  . [name] DILATION N/A 09/17/2019   Procedure: [name] DILATION;  Surgeon: [name], MD;  [facility];  Service: Endoscopy;  Laterality: N/A;  . NASAL SEPTOPLASTY W/ TURBINOPLASTY  10/06/2011   Procedure: NASAL SEPTOPLASTY WITH TURBINATE REDUCTION;  Surgeon: Izora Gala, MD;  Location: [place];  Service: ENT;  Laterality: Bilateral;  . PERCUTANEOUS CORONARY STENT INTERVENTION (PCI-S) N/A 01/26/2012   Procedure: PERCUTANEOUS CORONARY STENT INTERVENTION (PCI-S);  Surgeon: [name], MD;  Location: [facility] CATH LAB;  Service: Cardiovascular;  Laterality: N/A;  . POLYPECTOMY  09/15/2015   Procedure: POLYPECTOMY;  Surgeon: [name], MD;  [facility];  Service:  Endoscopy;;  Cecal polyp removed via cold snare/ Descending colon polyp removed via cold snare  . POLYPECTOMY  12/19/2017   Procedure: POLYPECTOMY;  Surgeon: [name], MD;  [facility];  Service: Endoscopy;;  colon    Family Psychiatric History: see below  Family History:  Family History  Problem Relation Age of Onset  . Lung disease Father        deceased, black lung  . Heart disease Mother        blood clots  . Depression Mother   . Cancer Paternal Uncle        unknown type  . Cancer Maternal Aunt        unknown type  . Kidney failure Maternal Uncle   . Hypertension Brother   . Colon cancer Neg Hx   . ADD / ADHD Neg Hx   . Alcohol abuse Neg Hx   . Drug abuse Neg Hx   . Anxiety disorder Neg Hx   . Bipolar disorder Neg Hx   . Dementia Neg Hx   . OCD Neg Hx   . Paranoid behavior Neg Hx   . Schizophrenia Neg Hx   . Physical abuse Neg Hx   . Sexual abuse Neg Hx   . Seizures Neg Hx     Social History:  Social History   Socioeconomic History  . Marital status: Married    Spouse name: Not on file  . Number of children: 3  . Years of education: Not on file  . Highest education level: Not on file  Occupational History  . Occupation: disable    Employer: RETIRED    Comment: [employer]  Tobacco Use  . Smoking status: Current Some Day Smoker    Packs/day: 0.50    Years: 30.00    Pack years: 15.00    Types: Cigarettes    Start date: 07/31/1969  . Smokeless tobacco: Former Systems developer  . Tobacco comment: cutting back trying to quit  Substance and Sexual Activity  . Alcohol use: Yes    Comment: Drinks a beer occasionally  . Drug use: No  . Sexual activity: Not Currently  Other Topics Concern  . Not on file  Social History Narrative   3 stepchildren   Social Determinants of Health   Financial Resource Strain:   . Difficulty of Paying Living Expenses:   Food Insecurity:   . Worried About Charity fundraiser in the Last Year:   . [place] in the Last  Year:   Transportation Needs:   . Film/video editor (Medical):   Marland Kitchen Lack of Transportation (Non-Medical):   Physical Activity:   . Days of Exercise per Week:   . Minutes of Exercise per Session:   Stress:   . Feeling of Stress :   Social Connections:   . Frequency of Communication with Friends and Family:   . Frequency of Social Gatherings with Friends and Family:   . Attends Religious Services:   . Active Member of Clubs or Organizations:   . Attends Archivist Meetings:   Marland Kitchen Marital Status:     Allergies: No Known Allergies  Metabolic Disorder Labs: Lab Results  Component Value Date   HGBA1C 5.3 07/24/2015   MPG 103 10/29/2013   MPG 97 04/30/2013   No results found for: PROLACTIN Lab Results  Component Value Date   CHOL 129 08/29/2019   TRIG 413 (H) 08/29/2019   HDL 31 (L) 08/29/2019   CHOLHDL 4.2 08/29/2019   VLDL UNABLE TO CALCULATE IF TRIGLYCERIDE OVER 400 mg/dL 08/29/2019   LDLCALC UNABLE TO CALCULATE IF TRIGLYCERIDE OVER 400 mg/dL 08/29/2019   LDLCALC 43 12/13/2017   Lab Results  Component Value Date   TSH 1.011 02/09/2010    Therapeutic Level Labs: No results found for: LITHIUM No results found for: VALPROATE No components found for:  CBMZ  Current Medications: Current Outpatient Medications  Medication Sig Dispense Refill  . albuterol (PROVENTIL) (2.5 MG/3ML) 0.083% nebulizer solution Take 3 mLs (2.5 mg total) by nebulization every 6 (six) hours as needed for wheezing or shortness of breath. 75 mL 1  . ALPRAZolam (XANAX) 1 MG tablet Take 1 tablet (1 mg total) by mouth 4 (four) times daily. 120 tablet 2  . aspirin EC 81 MG tablet Take 81 mg by mouth daily.    Marland Kitchen atorvastatin (LIPITOR) 80 MG tablet TAKE 1 TABLET BY MOUTH EVERY DAY (Patient taking differently: Take 80 mg by mouth daily. ) 90 tablet 3  . budesonide-formoterol (SYMBICORT) 80-4.5 MCG/ACT inhaler Inhale 2 puffs into the lungs 2 (two) times daily. 1 Inhaler 12  . dicyclomine  (BENTYL) 10 MG capsule Take 10 mg by mouth 4 (four) times daily.    Marland Kitchen FLUoxetine (PROZAC) 20 MG capsule Take 1 capsule (20 mg total) by mouth daily. 90 capsule 2  . FLUoxetine (PROZAC) 40 MG capsule Take 1 capsule (40 mg total) by mouth daily. Take with 20 mg to equal 60 mg daily 90 capsule 2  . fluticasone (FLONASE) 50 MCG/ACT nasal spray Place 2 sprays into both nostrils daily. (Patient taking differently: Place 2 sprays into both nostrils daily as needed for allergies. ) 16 g 5  . gabapentin (NEURONTIN) 300 MG capsule Take 2 capsules (600 mg total) by mouth 3 (three) times daily. 540 capsule 2  . HYDROcodone-acetaminophen (NORCO) 7.5-325 MG tablet Take 1 tablet by mouth 2 (two) times daily as needed for moderate pain. 60 tablet 0  . HYDROcodone-acetaminophen (NORCO) 7.5-325 MG tablet Take one tablet by mouth 2 (two) times daily as needed for moderate pain 60 tablet 0  . HYDROcodone-acetaminophen (NORCO) 7.5-325 MG tablet Take one tablet by mouth 2 ( two ) times daily as needed for moderate pain 60 tablet 0  . hydrocortisone (ANUSOL-HC) 2.5 % rectal cream Place 1 application rectally 2 (two) times daily. 30 g 1  . isosorbide mononitrate (IMDUR) 30 MG 24 hr tablet Take 1 tablet (30 mg total) by mouth daily. 90 tablet 3  . lipase/protease/amylase (  CREON) 36000 UNITS CPEP capsule Take 2 capsules (72,000 Units total) by mouth 3 (three) times daily with meals. 1 with snacks 240 capsule 3  . lisinopril (ZESTRIL) 5 MG tablet Take 1 tablet (5 mg total) by mouth daily. Take one tablet daily 90 tablet 1  . meloxicam (MOBIC) 15 MG tablet TAKE 1 TABLET BY MOUTH EVERY DAY (Patient taking differently: Take 15 mg by mouth daily. ) 30 tablet 5  . metoprolol succinate (TOPROL-XL) 50 MG 24 hr tablet Take 1 tablet (50 mg total) by mouth daily. 90 tablet 3  . nitroGLYCERIN (NITROSTAT) 0.4 MG SL tablet Place 0.4 mg under the tongue every 5 (five) minutes as needed for chest pain.    Marland Kitchen OLANZapine (ZYPREXA) 10 MG tablet  Take 1 tablet (10 mg total) by mouth at bedtime. 90 tablet 2  . pantoprazole (PROTONIX) 40 MG tablet Take 1 tablet (40 mg total) by mouth 2 (two) times daily before a meal. 180 tablet 3  . rOPINIRole (REQUIP) 2 MG tablet TAKE 1 TABLET BY MOUTH DAILY AT BEDTIME (Patient taking differently: Take 2 mg by mouth at bedtime. ) 90 tablet 2  . TRELEGY ELLIPTA 100-62.5-25 MCG/INH AEPB Inhale 1 puff into the lungs daily. 28 each 2   No current facility-administered medications for this visit.     Musculoskeletal: Strength & Muscle Tone: within normal limits Gait & Station: normal Patient leans: N/A  Psychiatric Specialty Exam: Review of Systems  Constitutional: Positive for fatigue.  Cardiovascular: Positive for chest pain.  Neurological: Positive for tremors.  All other systems reviewed and are negative.   There were no vitals taken for this visit.There is no height or weight on file to calculate BMI.  General Appearance: NA  Eye Contact:  NA  Speech:  Clear and Coherent  Volume:  Normal  Mood:  Euthymic  Affect:  NA  Thought Process:  Goal Directed  Orientation:  Full (Time, Place, and Person)  Thought Content: WDL   Suicidal Thoughts:  No  Homicidal Thoughts:  No  Memory:  Immediate;   Good Recent;   Good Remote;   Fair  Judgement:  Good  Insight:  Fair  Psychomotor Activity:  Tremor  Concentration:  Concentration: Good and Attention Span: Good  Recall:  Good  Fund of Knowledge: Good  Language: Good  Akathisia:  No  Handed:  Right  AIMS (if indicated): not done  Assets:  Communication Skills Desire for Improvement Resilience Social Support Talents/Skills  ADL's:  Intact  Cognition: WNL  Sleep:  Good   Screenings: GAD-7     Office Visit from 03/30/2018 in [place]  Total GAD-7 Score  4    PHQ2-9     Office Visit from 06/21/2019 in [place] Office Visit from 03/30/2018 in [place] Office Visit from 05/19/2017 in  [place] Office Visit from 10/21/2015 in [place] Office Visit from 04/23/2015 in [place]  PHQ-2 Total Score  3  2  0  0  0  PHQ-9 Total Score  11  12  --  --  --       Assessment and Plan: This patient is a 64 year old male with a history of depression anxiety and posttraumatic stress disorder relating to his daughter suicide.  He states that he is generally doing well on his current regimen.  He will continue Xanax 1 mg 4 times daily for anxiety, Zyprexa 10 mg at bedtime for mood stabilization, gabapentin 600 mg  3 times daily for anxiety, Prozac 60 mg daily for depression.  He will return to see me in 3 months   [name], MD 10/23/2019, 9:53 AM

## 2019-10-25 ENCOUNTER — Encounter: Payer: Self-pay | Admitting: Family Medicine

## 2019-10-29 ENCOUNTER — Encounter: Payer: Self-pay | Admitting: Family Medicine

## 2019-10-30 ENCOUNTER — Ambulatory Visit: Payer: Medicare PPO | Admitting: Internal Medicine

## 2019-10-30 ENCOUNTER — Other Ambulatory Visit: Payer: Self-pay

## 2019-10-30 ENCOUNTER — Encounter: Payer: Self-pay | Admitting: Internal Medicine

## 2019-10-30 DIAGNOSIS — I10 Essential (primary) hypertension: Secondary | ICD-10-CM

## 2019-10-30 DIAGNOSIS — Z125 Encounter for screening for malignant neoplasm of prostate: Secondary | ICD-10-CM | POA: Diagnosis not present

## 2019-10-30 DIAGNOSIS — J449 Chronic obstructive pulmonary disease, unspecified: Secondary | ICD-10-CM

## 2019-10-30 DIAGNOSIS — F17210 Nicotine dependence, cigarettes, uncomplicated: Secondary | ICD-10-CM

## 2019-10-30 MED ORDER — TELMISARTAN 40 MG PO TABS
40.0000 mg | ORAL_TABLET | Freq: Every day | ORAL | 11 refills | Status: DC
Start: 2019-10-30 — End: 2021-03-19

## 2019-10-30 MED ORDER — ALBUTEROL SULFATE HFA 108 (90 BASE) MCG/ACT IN AERS
2.0000 | INHALATION_SPRAY | RESPIRATORY_TRACT | 1 refills | Status: AC | PRN
Start: 2019-10-30 — End: ?

## 2019-10-30 MED ORDER — BUDESONIDE-FORMOTEROL FUMARATE 80-4.5 MCG/ACT IN AERO: INHALATION_SPRAY | RESPIRATORY_TRACT | 12 refills | Status: DC

## 2019-10-30 NOTE — Progress Notes (Signed)
[name], male    DOB: 08-30-1955    MRN: [id number]   Brief patient profile:  64 yowm active smoker with onset doe x 2012 with COPD /GOLD 1 criteria  in 2013   but not taking any inhalers regularly and steadily getting worse with sob and cough so referred to pulmonary clinic I [place]  10/30/2019 by Dr   [name]     History of Present Illness  10/30/2019  Pulmonary/ 1st office eval/[name] / [name]  Chief Complaint  Patient presents with  . Pulmonary Consult    Referred by Dr. Nicki [name]. Pt c/o DOE for the couple of years. He gets winded walking to his mailbox. He has a Dulera inhaler that he uses as his emegency inhaler 1-2 x per day. He uses albuterol neb 1-2 x per month.   Dyspnea:  mb at least 100 ft with incline has to stop most times Cough: sometimes early in am's rarely productive min discolored  Sleep: feels like choking/ suffocating flat no better with inhaler x years  SABA use: confused - calls dulera one or twice a day not helping   No obvious day to day or daytime variability or  r mucus plugs or hemoptysis or cp or chest tightness, subjective wheeze or overt sinus or hb symptoms.    Also denies any obvious fluctuation of symptoms with weather or environmental changes or other aggravating or alleviating factors except as outlined above   No unusual exposure hx or h/o childhood pna/ asthma or knowledge of premature birth.  Current Allergies, Complete Past Medical History, Past Surgical History, Family History, and Social History were reviewed in [employer] record.  ROS  The following are not active complaints unless bolded Hoarseness, sore throat, dysphagia, dental problems, itching, sneezing,  nasal congestion or discharge of excess mucus or purulent secretions, ear ache,   fever, chills, sweats, unintended wt loss or wt gain, classically pleuritic or exertional cp,  orthopnea pnd or arm/hand swelling  or leg swelling, presyncope,  palpitations, abdominal pain, anorexia, nausea, vomiting, diarrhea  or change in bowel habits or change in bladder habits, change in stools or change in urine, dysuria, hematuria,  rash, arthralgias, visual complaints, headache, numbness, weakness or ataxia or problems with walking or coordination,  change in mood or  memory.              Past Medical History:  Diagnosis Date  . Anxiety   . Anxiety and depression   . ASCVD (arteriosclerotic cardiovascular disease)    a. s/p PTCA to LCx in 1993 b. BMS to RCA in 01/2012 with residual 80% OM2 stenosis and medical management recommended c. 08/2019: cath showing patent stents with occluded OM2 --> medical management.   . Asthma   . CAD (coronary artery disease)   . Carcinoma in situ of colon 2004   rectal polyp  . Colitis, ischemic ([place]) 2011  . COPD (chronic obstructive pulmonary disease) (HCC)    mild ;excercise induced hypoxemia by cp stress test ;asthma ,bronchitis,  . Depression   . Diverticulosis   . Gastritis 12/30/10   EGD Dr [name]  . GERD (gastroesophageal reflux disease)   . GI bleed   . H. pylori infection 2004   treated  . Hemorrhoids   . Hiatal hernia   . Hyperlipidemia   . Hypertension   . Inflammatory polyps of colon with rectal bleeding ([place])   . Leukocytosis    [name]  . Lung nodule  07/16/2011  . Myocardial infarction [facility])    age 64  . Restless leg syndrome   . Schatzki's ring   . Sleep apnea    does not use CPAP:cannot tolerate, PCP aware  . Syncope   . Tobacco abuse    50 pack years continuing at one halp pack daily  . Tubular adenoma of colon 06/2009   Colonosocpy Dr [name]    Outpatient Medications Prior to Visit  Medication Sig Dispense Refill  . albuterol (PROVENTIL) (2.5 MG/3ML) 0.083% nebulizer solution Take 3 mLs (2.5 mg total) by nebulization every 6 (six) hours as needed for wheezing or shortness of breath. 75 mL 1  . ALPRAZolam (XANAX) 1 MG tablet Take 1 tablet (1 mg total) by mouth 4  (four) times daily. 120 tablet 2  . aspirin EC 81 MG tablet Take 81 mg by mouth daily.    Marland Kitchen atorvastatin (LIPITOR) 80 MG tablet TAKE 1 TABLET BY MOUTH EVERY DAY (Patient taking differently: Take 80 mg by mouth daily. ) 90 tablet 3  . budesonide-formoterol (SYMBICORT) 80-4.5 MCG/ACT inhaler Inhale 2 puffs into the lungs 2 (two) times daily. 1 Inhaler 12  . dicyclomine (BENTYL) 10 MG capsule Take 10 mg by mouth 4 (four) times daily.    Marland Kitchen FLUoxetine (PROZAC) 20 MG capsule Take 1 capsule (20 mg total) by mouth daily. 90 capsule 2  . FLUoxetine (PROZAC) 40 MG capsule Take 1 capsule (40 mg total) by mouth daily. Take with 20 mg to equal 60 mg daily 90 capsule 2  . fluticasone (FLONASE) 50 MCG/ACT nasal spray Place 2 sprays into both nostrils daily. (Patient taking differently: Place 2 sprays into both nostrils daily as needed for allergies. ) 16 g 5  . gabapentin (NEURONTIN) 300 MG capsule Take 2 capsules (600 mg total) by mouth 3 (three) times daily. 540 capsule 2  . HYDROcodone-acetaminophen (NORCO) 7.5-325 MG tablet Take 1 tablet by mouth 2 (two) times daily as needed for moderate pain. 60 tablet 0  . hydrocortisone (ANUSOL-HC) 2.5 % rectal cream Place 1 application rectally 2 (two) times daily. 30 g 1  . isosorbide mononitrate (IMDUR) 30 MG 24 hr tablet Take 1 tablet (30 mg total) by mouth daily. 90 tablet 3  . lipase/protease/amylase (CREON) 36000 UNITS CPEP capsule Take 2 capsules (72,000 Units total) by mouth 3 (three) times daily with meals. 1 with snacks 240 capsule 3  . lisinopril (ZESTRIL) 5 MG tablet Take 1 tablet (5 mg total) by mouth daily. Take one tablet daily 90 tablet 1  . meloxicam (MOBIC) 15 MG tablet TAKE 1 TABLET BY MOUTH EVERY DAY (Patient taking differently: Take 15 mg by mouth daily. ) 30 tablet 5  . metoprolol succinate (TOPROL-XL) 50 MG 24 hr tablet Take 1 tablet (50 mg total) by mouth daily. 90 tablet 3  . nitroGLYCERIN (NITROSTAT) 0.4 MG SL tablet Place 0.4 mg under the tongue  every 5 (five) minutes as needed for chest pain.    Marland Kitchen OLANZapine (ZYPREXA) 10 MG tablet Take 1 tablet (10 mg total) by mouth at bedtime. 90 tablet 2  . pantoprazole (PROTONIX) 40 MG tablet Take 1 tablet (40 mg total) by mouth 2 (two) times daily before a meal. 180 tablet 3  . rOPINIRole (REQUIP) 2 MG tablet TAKE 1 TABLET BY MOUTH DAILY AT BEDTIME (Patient taking differently: Take 2 mg by mouth at bedtime. ) 90 tablet 2  . HYDROcodone-acetaminophen (NORCO) 7.5-325 MG tablet Take one tablet by mouth 2 (two) times daily as  needed for moderate pain (Patient not taking: Reported on 10/30/2019) 60 tablet 0  . HYDROcodone-acetaminophen (NORCO) 7.5-325 MG tablet Take one tablet by mouth 2 ( two ) times daily as needed for moderate pain (Patient not taking: Reported on 10/30/2019) 60 tablet 0  . TRELEGY ELLIPTA 100-62.5-25 MCG/INH AEPB Inhale 1 puff into the lungs daily. 28 each 2   No facility-administered medications prior to visit.     Objective:     BP 120/82 (BP Location: Left Arm, Cuff Size: Normal)   Pulse 64   Temp (!) 97 F (36.1 C) (Temporal)   Ht 5\' 10"  (1.778 m)   Wt 170 lb (77.1 kg)   SpO2 99% Comment: on RA  BMI 24.39 kg/m   SpO2: 99 %(on RA)   amb wm / gruff voice    HEENT : pt wearing mask not removed for exam due to covid -19 concerns.    NECK :  without JVD/Nodes/TM/ nl carotid upstrokes bilaterally   LUNGS: no acc muscle use,  Nl contour chest which is clear to A and P bilaterally without cough on insp or exp maneuvers   CV:  RRR  no s3 or murmur or increase in P2, and no edema   ABD:  soft and nontender with nl inspiratory excursion in the supine position. No bruits or organomegaly appreciated, bowel sounds nl  MS:  Nl gait/ ext warm without deformities, calf tenderness, cyanosis or clubbing No obvious joint restrictions   SKIN: warm and dry without lesions    NEURO:  alert, approp, nl sensorium with  no motor or cerebellar deficits apparent.    I personally  reviewed images and agree with radiology impression as follows:  pCXR:   08/28/19  No evidence of acute cardiopulmonary abnormality.    Assessment   No problem-specific Assessment & Plan notes found for this encounter.    Christinia Gully, MD 10/30/2019

## 2019-10-30 NOTE — Assessment & Plan Note (Signed)
Counseled re importance of smoking cessation but did not meet time criteria for separate billing   °

## 2019-10-30 NOTE — Assessment & Plan Note (Signed)
D/c acei 10/30/2019 due to ? pseudocopd  In the best review of chronic cough to date ( NEJM 2016 375 [id number]) ,  ACEi are now felt to cause cough in up to  20% of pts which is a 4 fold increase from previous reports and does not include the variety of non-specific complaints we see in pulmonary clinic in pts on ACEi but previously attributed to another dx like  Copd/asthma and  include PNDS, throat and chest congestion, "bronchitis", unexplained dyspnea and noct "strangling" sensations, and hoarseness, but also  atypical /refractory GERD symptoms like dysphagia and "bad heartburn"   The only way I know  to prove this is not an "ACEi Case" is a trial off ACEi x a minimum of 6 weeks then regroup.   Try off lisinopril and on micardis 40 mg daily           Each maintenance medication was reviewed in detail including emphasizing most importantly the difference between maintenance and prns and under what circumstances the prns are to be triggered using an action plan format where appropriate.  Total time for H and P, chart review, counseling, teaching device and generating customized AVS unique to this office visit / charting = 60 min

## 2019-10-30 NOTE — Assessment & Plan Note (Addendum)
Active smoker - PFT's  Done 124/13 with cc "sob with any exertion"    FEV1 3.04 (81 % ) ratio 0.67   p no % improvement from saba p ? prior to study with DLCO  17.33 (53%) corrects to 3.18 (68 %)  for alv volume and FV curve min concavity   - 10/30/2019  After extensive coaching inhaler device,  effectiveness =    75% from a baseline near 0   DDX of  difficult airways management almost all start with A and  include Adherence, Ace Inhibitors, Acid Reflux, Active Sinus Disease, Alpha 1 Antitripsin deficiency, Anxiety masquerading as Airways dz,  ABPA,  Allergy(esp in young), Aspiration (esp in elderly), Adverse effects of meds,  Active smoking or vaping, A bunch of PE's (a small clot burden can't cause this syndrome unless there is already severe underlying pulm or vascular dz with poor reserve) plus two Bs  = Bronchiectasis and Beta blocker use..and one C= CHF   Adherence is always the initial "prime suspect" and is a multilayered concern that requires a "trust but verify" approach in every patient - starting with knowing how to use medications, especially inhalers, correctly, keeping up with refills and understanding the fundamental difference between maintenance and prns vs those medications only taken for a very short course and then stopped and not refilled.  - see hfa teaching - return with all meds in hand using a trust but verify approach to confirm accurate Medication  Reconciliation The principal here is that until we are certain that the  patients are doing what we've asked, it makes no sense to ask them to do more.    Active smoking very top of the list of usual suspects - see sep a/p  ACEi adverse effects at the  top of the usual list of suspects and the only way to rule it out is a trial off > see a/p    ? Acid (or non-acid) GERD > always difficult to exclude as up to 75% of pts in some series report no assoc GI/ Heartburn symptoms> rec continue max (24h)  acid suppression   ? Allergy/  nothing to suggest  ? Alpha one AT can check next ov but doubt it given how well preserved his lung function was previoiusly   ? Beta blocker effects - ideally In the setting of respiratory symptoms of unknown etiology,  It would be preferable to use bystolic, the most beta -1  selective Beta blocker available in sample form, with bisoprolol the most selective generic choice  on the market, at least on a trial basis, to make sure the spillover Beta 2 effects of the less specific Beta blockers are not contributing to this patient's symptoms.  >>> ok to for now to leave on low doses of toprol  ? Chf/ cardiac asthma:  LHC looks good 08/29/19  at same time he had present symptoms so chf very unlikely   >>> regroup in 6 weeks  On symbicort 80 2bid and prn saba - see avs for instructions unique to this ov re action plan

## 2019-10-30 NOTE — Patient Instructions (Addendum)
Stop lisinopril (zestoril) and start micardis (telmasartan) 40 mg daily   Keep walking to mailbox daily to see if you improve   The key is to stop smoking completely before smoking completely stops you - it's not too late!  Continue protonix Take 30- 60 min before your first and last meals of the day   Plan A = Automatic = Always=    symbicort 80 (dulera 100) Take 2 puffs first thing in am and then another 2 puffs about 12 hours later.   Work on inhaler technique:  relax and gently blow all the way out then take a nice smooth deep breath back in, triggering the inhaler at same time you start breathing in.  Hold for up to 5 seconds if you can. Blow out thru nose. Rinse and gargle with water when done     Plan B = Backup (to supplement plan A, not to replace it) Only use your albuterol inhaler as a rescue medication to be used if you can't catch your breath by resting or doing a relaxed purse lip breathing pattern.  - The less you use it, the better it will work when you need it. - Ok to use the inhaler up to 2 puffs  every 4 hours if you must but call for appointment if use goes up over your usual need - Don't leave home without it !!  (think of it like the spare tire for your car)   Plan C = Crisis (instead of Plan B but only if Plan B stops working) - only use your albuterol nebulizer if you first try Plan B and it fails to help > ok to use the nebulizer up to every 4 hours but if start needing it regularly call for immediate appointment      Please schedule a follow up office visit in 6 weeks, call sooner if needed with all medications /inhalers/ solutions in hand so we can verify exactly what you are taking. This includes all medications from all doctors and over the counters

## 2019-10-31 ENCOUNTER — Other Ambulatory Visit (HOSPITAL_COMMUNITY): Payer: Self-pay | Admitting: Psychiatry

## 2019-10-31 ENCOUNTER — Encounter: Payer: Self-pay | Admitting: Family Medicine

## 2019-10-31 LAB — BASIC METABOLIC PANEL
BUN/Creatinine Ratio: 13 (ref 10–24)
BUN: 10 mg/dL (ref 8–27)
CO2: 22 mmol/L (ref 20–29)
Calcium: 9.1 mg/dL (ref 8.6–10.2)
Chloride: 107 mmol/L — ABNORMAL HIGH (ref 96–106)
Creatinine, Ser: 0.79 mg/dL (ref 0.76–1.27)
GFR calc Af Amer: 110 mL/min/{1.73_m2} (ref >59)
GFR calc non Af Amer: 96 mL/min/{1.73_m2} (ref >59)
Glucose: 96 mg/dL (ref 65–99)
Potassium: 3.8 mmol/L (ref 3.5–5.2)
Sodium: 144 mmol/L (ref 134–144)

## 2019-10-31 LAB — PSA: Prostate Specific Ag, Serum: 1.2 ng/mL (ref 0.0–4.0)

## 2019-11-14 ENCOUNTER — Other Ambulatory Visit: Payer: Self-pay | Admitting: Nurse Practitioner

## 2019-11-14 ENCOUNTER — Other Ambulatory Visit: Payer: Self-pay | Admitting: Student

## 2019-12-11 ENCOUNTER — Ambulatory Visit: Payer: Self-pay | Admitting: Internal Medicine

## 2019-12-11 NOTE — Progress Notes (Deleted)
[name], male    DOB: 1956-05-31    MRN: [id number]   Brief patient profile:  19 yowm active smoker with onset doe x 2012 with COPD /GOLD 1 criteria  in 2013   but not taking any inhalers regularly and steadily getting worse with sob and cough so referred to pulmonary clinic I [name]  10/30/2019 by Dr   [name]     History of Present Illness  10/30/2019  Pulmonary/ 1st office eval/[name] / [name]  Chief Complaint  Patient presents with  . Pulmonary Consult    Referred by Dr. [name]. Pt c/o DOE for the couple of years. He gets winded walking to his mailbox. He has a Dulera inhaler that he uses as his emegency inhaler 1-2 x per day. He uses albuterol neb 1-2 x per month.   Dyspnea:  mb at least 100 ft with incline has to stop most times Cough: sometimes early in am's rarely productive min discolored  Sleep: feels like choking/ suffocating flat no better with inhaler x years  SABA use: confused - calls dulera one or twice a day not helping  rec Stop lisinopril (zestoril) and start micardis (telmasartan) 40 mg daily  Keep walking to mailbox daily to see if you improve  The key is to stop smoking completely before smoking completely stops you - it's not too late! Continue protonix Take 30- 60 min before your first and last meals of the day  Plan A = Automatic = Always=    symbicort 80 (dulera 100) Take 2 puffs first thing in am and then another 2 puffs about 12 hours later.  Work on inhaler technique:   Plan B = Backup (to supplement plan A, not to replace it) Only use your albuterol inhaler as a rescue medication  Plan C = Crisis (instead of Plan B but only if Plan B stops working) - only use your albuterol nebulizer if you first try Plan B and it fails to help     12/11/2019  f/u ov/[name] re:  No chief complaint on file.    Dyspnea:  *** Cough: *** Sleeping: *** SABA use: *** 02: ***   No obvious day to day or daytime variability or assoc excess/ purulent sputum  or mucus plugs or hemoptysis or cp or chest tightness, subjective wheeze or overt sinus or hb symptoms.   *** without nocturnal  or early am exacerbation  of respiratory  c/o's or need for noct saba. Also denies any obvious fluctuation of symptoms with weather or environmental changes or other aggravating or alleviating factors except as outlined above   No unusual exposure hx or h/o childhood pna/ asthma or knowledge of premature birth.  Current Allergies, Complete Past Medical History, Past Surgical History, Family History, and Social History were reviewed in [employer] record.  ROS  The following are not active complaints unless bolded Hoarseness, sore throat, dysphagia, dental problems, itching, sneezing,  nasal congestion or discharge of excess mucus or purulent secretions, ear ache,   fever, chills, sweats, unintended wt loss or wt gain, classically pleuritic or exertional cp,  orthopnea pnd or arm/hand swelling  or leg swelling, presyncope, palpitations, abdominal pain, anorexia, nausea, vomiting, diarrhea  or change in bowel habits or change in bladder habits, change in stools or change in urine, dysuria, hematuria,  rash, arthralgias, visual complaints, headache, numbness, weakness or ataxia or problems with walking or coordination,  change in mood or  memory.  No outpatient medications have been marked as taking for the 12/11/19 encounter (Appointment) with Tanda Rockers, MD.                           Past Medical History:  Diagnosis Date  . Anxiety   . Anxiety and depression   . ASCVD (arteriosclerotic cardiovascular disease)    a. s/p PTCA to LCx in 1993 b. BMS to RCA in 01/2012 with residual 80% OM2 stenosis and medical management recommended c. 08/2019: cath showing patent stents with occluded OM2 --> medical management.   . Asthma   . CAD (coronary artery disease)   . Carcinoma in situ of colon 2004   rectal polyp  . Colitis, ischemic  ([place]) 2011  . COPD (chronic obstructive pulmonary disease) (HCC)    mild ;excercise induced hypoxemia by cp stress test ;asthma ,bronchitis,  . Depression   . Diverticulosis   . Gastritis 12/30/10   EGD Dr [name]  . GERD (gastroesophageal reflux disease)   . GI bleed   . H. pylori infection 2004   treated  . Hemorrhoids   . Hiatal hernia   . Hyperlipidemia   . Hypertension   . Inflammatory polyps of colon with rectal bleeding ([place])   . Leukocytosis    [name]  . Lung nodule 07/16/2011  . Myocardial infarction [facility])    age 64  . Restless leg syndrome   . Schatzki's ring   . Sleep apnea    does not use CPAP:cannot tolerate, PCP aware  . Syncope   . Tobacco abuse    50 pack years continuing at one halp pack daily  . Tubular adenoma of colon 06/2009   Colonosocpy Dr [name]         Objective:     Wt Readings from Last 3 Encounters:  10/30/19 170 lb (77.1 kg)  10/22/19 170 lb 6.4 oz (77.3 kg)  09/13/19 173 lb (78.5 kg)     Vital signs reviewed - Note on arrival 12/11/2019  02 sats  ***% on ***               Assessment

## 2019-12-31 ENCOUNTER — Ambulatory Visit: Payer: Medicare PPO | Admitting: Cardiology

## 2019-12-31 NOTE — Progress Notes (Deleted)
Clinical Summary [name] is a 64 y.o.male seen today for follow up of the following medical problems.   1. 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 LVEF 50-55% by LV gram.  08/2011 echo LVEF 25%, grade I diastolic dysfunction.  03/2016 echo: LVEF 85%, normal diastolic function   07/7780 nuclear stress: inferior /inferolaterla infarct without ischemia. LVEF 45%  - no recent chest pain. Mild SOB at rest and with activity. Some cough. Compliant with inhalers -compliant with meds  - admit 08/2019 with chest pain - 08/2019 cath: distal LAD 20%, D1 20%, LCX 45% prox, OM2 occluded CTO. RCA patent stent. OM poor PCI target - headaches on imdur have limited use - ACE was changed to ARB by pulmonary   2. Hyperlipidemia 12/2017 TC 138 TG 304 HDL 34 LDL 43 - compliant with statin  3. HTN - compliant with meds - 08/2018 pcp visit 120/90  4. COPD - followed by pcp   5. OSA - does not use CPAP machine due to discomfort   6. GERD/Dysphagia - followed by GI     SH: enjoys working in yard, fishing. Daughter fairly recently committed suicide by hanging herself   Past Medical History:  Diagnosis Date   Anxiety    Anxiety and depression    ASCVD (arteriosclerotic cardiovascular disease)    a. s/p PTCA to LCx in 1993 b. BMS to RCA in 01/2012 with residual 80% OM2 stenosis and medical management recommended c. 08/2019: cath showing patent stents with occluded OM2 --> medical management.    Asthma    CAD (coronary artery disease)    Carcinoma in situ of colon 2004   rectal polyp   Colitis, ischemic ([place]) 2011   COPD (chronic obstructive pulmonary disease) (HCC)    mild ;excercise induced hypoxemia by cp stress test ;asthma ,bronchitis,   Depression    Diverticulosis    Gastritis 12/30/10   EGD Dr  [name]   GERD (gastroesophageal reflux disease)    GI bleed    H. 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)    Leukocytosis    [name]   Lung nodule 07/16/2011   Myocardial infarction [facility])    age 72   Restless leg syndrome    Schatzki's ring    Sleep apnea    does not use CPAP:cannot tolerate, PCP aware   Syncope    Tobacco abuse    50 pack years continuing at one halp pack daily   Tubular adenoma of colon 06/2009   Colonosocpy Dr [name]     No Known Allergies   Current Outpatient Medications  Medication Sig Dispense Refill   albuterol (PROVENTIL) (2.5 MG/3ML) 0.083% nebulizer solution Take 3 mLs (2.5 mg total) by nebulization every 6 (six) hours as needed for wheezing or shortness of breath. 75 mL 1   albuterol (VENTOLIN HFA) 108 (90 Base) MCG/ACT inhaler Inhale 2 puffs into the lungs every 4 (four) hours as needed for wheezing or shortness of breath. 18 g 1   ALPRAZolam (XANAX) 1 MG tablet Take 1 tablet (1 mg total) by mouth 4 (four) times daily. 120 tablet 2  aspirin EC 81 MG tablet Take 81 mg by mouth daily.     atorvastatin (LIPITOR) 80 MG tablet TAKE 1 TABLET BY MOUTH EVERY DAY (Patient taking differently: Take 80 mg by mouth daily. ) 90 tablet 3   budesonide-formoterol (SYMBICORT) 80-4.5 MCG/ACT inhaler Take 2 puffs first thing in am and then another 2 puffs about 12 hours later. 1 Inhaler 12   dicyclomine (BENTYL) 10 MG capsule TAKE 1 CAPSULE (10 MG TOTAL) BY MOUTH 4 (FOUR) TIMES DAILY - BEFORE MEALS AND AT BEDTIME. 360 capsule 1   FLUoxetine (PROZAC) 20 MG capsule Take 1 capsule (20 mg total) by mouth daily. 90 capsule 2   FLUoxetine (PROZAC) 40 MG capsule Take 1 capsule (40 mg total) by mouth daily. Take with 20 mg to equal 60 mg daily 90 capsule 2   fluticasone (FLONASE) 50 MCG/ACT nasal spray Place 2 sprays into both  nostrils daily. (Patient taking differently: Place 2 sprays into both nostrils daily as needed for allergies. ) 16 g 5   gabapentin (NEURONTIN) 300 MG capsule Take 2 capsules (600 mg total) by mouth 3 (three) times daily. 540 capsule 2   HYDROcodone-acetaminophen (NORCO) 7.5-325 MG tablet Take 1 tablet by mouth 2 (two) times daily as needed for moderate pain. 60 tablet 0   HYDROcodone-acetaminophen (NORCO) 7.5-325 MG tablet Take one tablet by mouth 2 (two) times daily as needed for moderate pain (Patient not taking: Reported on 10/30/2019) 60 tablet 0   HYDROcodone-acetaminophen (NORCO) 7.5-325 MG tablet Take one tablet by mouth 2 ( two ) times daily as needed for moderate pain (Patient not taking: Reported on 10/30/2019) 60 tablet 0   hydrocortisone (ANUSOL-HC) 2.5 % rectal cream Place 1 application rectally 2 (two) times daily. 30 g 1   isosorbide mononitrate (IMDUR) 30 MG 24 hr tablet Take 1 tablet (30 mg total) by mouth daily. 90 tablet 3   lipase/protease/amylase (CREON) 36000 UNITS CPEP capsule Take 2 capsules (72,000 Units total) by mouth 3 (three) times daily with meals. 1 with snacks 240 capsule 3   meloxicam (MOBIC) 15 MG tablet TAKE 1 TABLET BY MOUTH EVERY DAY (Patient taking differently: Take 15 mg by mouth daily. ) 30 tablet 5   metoprolol succinate (TOPROL-XL) 50 MG 24 hr tablet TAKE 1 TABLET BY MOUTH EVERY DAY 90 tablet 1   nitroGLYCERIN (NITROSTAT) 0.4 MG SL tablet Place 0.4 mg under the tongue every 5 (five) minutes as needed for chest pain.     OLANZapine (ZYPREXA) 10 MG tablet Take 1 tablet (10 mg total) by mouth at bedtime. 90 tablet 2   pantoprazole (PROTONIX) 40 MG tablet Take 1 tablet (40 mg total) by mouth 2 (two) times daily before a meal. 180 tablet 3   rOPINIRole (REQUIP) 2 MG tablet TAKE 1 TABLET BY MOUTH DAILY AT BEDTIME (Patient taking differently: Take 2 mg by mouth at bedtime. ) 90 tablet 2   telmisartan (MICARDIS) 40 MG tablet Take 1 tablet (40 mg total) by  mouth daily. 30 tablet 11   No current facility-administered medications for this visit.     Past Surgical History:  Procedure Laterality Date   BIOPSY  12/19/2017   Procedure: BIOPSY;  Surgeon: [name], MD;  [facility];  Service: Endoscopy;;  gastric   CARDIAC CATHETERIZATION     CHOLECYSTECTOMY  2004   COLONOSCOPY  06/2009   normal terminal ileum, segmental mild inflammation of sigmoid colon (bx unremarkable), polyp, tubular adenoma   COLONOSCOPY N/A 07/31/2013  Dr.Rourk- redundant anal canal hemorrhoids, colonic diverticulosis, tubular adenoma   COLONOSCOPY W/ POLYPECTOMY  2004   rectal polyp with carcinoma in situ removed via colonoscopy   COLONOSCOPY WITH PROPOFOL N/A 09/15/2015   Dr. [name]: Scattered diverticula throughout the colon, 2 sessile polyps found in the descending colon and cecum, 5 mm in size.  Cecal polyp was sessile serrated polyp, descending colon polyp was a tubular adenoma.  He had a abnormal perianal exam along with grade 3 hemorrhoids.  Surveillance exam recommended for 5-year follow-up.   COLONOSCOPY WITH PROPOFOL N/A 12/19/2017   one 5 mm polyp in ascending colon and 1 cm sessile polyp in ascending s/p removal. Tubular adenoma. internal hemorrhoids   CORONARY ANGIOPLASTY WITH STENT PLACEMENT  01/26/2012   "1; total is now 2"   ESOPHAGOGASTRODUODENOSCOPY  02/2009   query Barrett's but bx negative   ESOPHAGOGASTRODUODENOSCOPY  12/30/2010   [name],gastritis, dilated 62F, sm HH, 1 small ulcer, Duodenal erosions, benign bx   ESOPHAGOGASTRODUODENOSCOPY (EGD) WITH ESOPHAGEAL DILATION N/A 09/12/2012   [id number] Schatzki's ring s/p Maloney dilator. Small hiatal hernia. negative path   ESOPHAGOGASTRODUODENOSCOPY (EGD) WITH ESOPHAGEAL DILATION N/A 07/31/2013   Dr. [name]- normal egd, s/p [facility] dilation empirically. Normal small bowel biopsies    ESOPHAGOGASTRODUODENOSCOPY (EGD) WITH PROPOFOL N/A 09/15/2015   Dr. [name]: Medium sized  hiatal hernia, normal-appearing esophagus status post empiric dilation   ESOPHAGOGASTRODUODENOSCOPY (EGD) WITH PROPOFOL N/A 12/19/2017   erosive esophagitis s/p dilation, erosive gastropathy, normal duodenum   ESOPHAGOGASTRODUODENOSCOPY (EGD) WITH PROPOFOL N/A 09/17/2019   Procedure: ESOPHAGOGASTRODUODENOSCOPY (EGD) WITH PROPOFOL;  Surgeon: [name], MD;  [facility];  Service: Endoscopy;  Laterality: N/A;  2:45pm - pt knows to arrive at 12:45   [place]  12/30/2010    [name],; internal hemorrhoids, anal papilla   HAND SURGERY     surgical intervention for injury of the fingers of the left hand many years ago   heart stent     HEMORRHOID BANDING     Dr. [name]   LEFT HEART CATH AND CORONARY ANGIOGRAPHY N/A 08/29/2019   Procedure: LEFT HEART CATH AND CORONARY ANGIOGRAPHY;  Surgeon: [name], MD;  Location: [place] [facility];  Service: Cardiovascular;  Laterality: N/A;   LEFT HEART CATHETERIZATION WITH CORONARY ANGIOGRAM N/A 01/26/2012   Procedure: LEFT HEART CATHETERIZATION WITH CORONARY ANGIOGRAM;  Surgeon: [name], MD;  Location: [facility] CATH LAB;  Service: Cardiovascular;  Laterality: N/A;   MALONEY DILATION N/A 09/15/2015   Procedure: [name] DILATION;  Surgeon: [name], MD;  [facility];  Service: Endoscopy;  Laterality: N/A;   MALONEY DILATION N/A 12/19/2017   Procedure: [name] DILATION;  Surgeon: [name], MD;  [facility];  Service: Endoscopy;  Laterality: N/A;   MALONEY DILATION N/A 09/17/2019   Procedure: [name] DILATION;  Surgeon: [name], MD;  [facility];  Service: Endoscopy;  Laterality: N/A;   NASAL SEPTOPLASTY W/ TURBINOPLASTY  10/06/2011   Procedure: NASAL SEPTOPLASTY WITH TURBINATE REDUCTION;  Surgeon: Izora Gala, MD;  Location: [facility];  Service: ENT;  Laterality: Bilateral;   PERCUTANEOUS CORONARY STENT INTERVENTION (PCI-S) N/A 01/26/2012   Procedure: PERCUTANEOUS  CORONARY STENT INTERVENTION (PCI-S);  Surgeon: [name], MD;  Location: [facility] CATH LAB;  Service: Cardiovascular;  Laterality: N/A;   POLYPECTOMY  09/15/2015   Procedure: POLYPECTOMY;  Surgeon: [name], MD;  [facility];  Service: Endoscopy;;  Cecal polyp removed via cold  snare/ Descending colon polyp removed via cold snare   POLYPECTOMY  12/19/2017   Procedure: POLYPECTOMY;  Surgeon: [name], MD;  [facility];  Service: Endoscopy;;  colon     No Known Allergies    Family History  Problem Relation Age of Onset   Lung disease Father        deceased, black lung   Heart disease Mother        blood clots   Depression Mother    Cancer Paternal Uncle        unknown type   Cancer Maternal Aunt        unknown type   Kidney failure Maternal Uncle    Hypertension Brother    Colon cancer Neg Hx    ADD / ADHD Neg Hx    Alcohol abuse Neg Hx    Drug abuse Neg Hx    Anxiety disorder Neg Hx    Bipolar disorder Neg Hx    Dementia Neg Hx    OCD Neg Hx    Paranoid behavior Neg Hx    Schizophrenia Neg Hx    Physical abuse Neg Hx    Sexual abuse Neg Hx    Seizures Neg Hx      Social History Mr. Shavers reports that he has been smoking cigarettes. He started smoking about 50 years ago. He has a 48.00 pack-year smoking history. He has quit using smokeless tobacco. Mr. Appleby reports current alcohol use.   Review of Systems CONSTITUTIONAL: No weight loss, fever, chills, weakness or fatigue.  HEENT: Eyes: No visual loss, blurred vision, double vision or yellow sclerae.No hearing loss, sneezing, congestion, runny nose or sore throat.  SKIN: No rash or itching.  CARDIOVASCULAR:  RESPIRATORY: No shortness of breath, cough or sputum.  GASTROINTESTINAL: No anorexia, nausea, vomiting or diarrhea. No abdominal pain or blood.  GENITOURINARY: No burning on urination, no polyuria NEUROLOGICAL: No headache, dizziness, syncope, paralysis,  ataxia, numbness or tingling in the extremities. No change in bowel or bladder control.  MUSCULOSKELETAL: No muscle, back pain, joint pain or stiffness.  LYMPHATICS: No enlarged nodes. No history of splenectomy.  PSYCHIATRIC: No history of depression or anxiety.  ENDOCRINOLOGIC: No reports of sweating, cold or heat intolerance. No polyuria or polydipsia.  Marland Kitchen   Physical Examination There were no vitals filed for this visit. There were no vitals filed for this visit.  Gen: resting comfortably, no acute distress HEENT: no scleral icterus, pupils equal round and reactive, no palptable cervical adenopathy,  CV Resp: Clear to auscultation bilaterally GI: abdomen is soft, non-tender, non-distended, normal bowel sounds, no hepatosplenomegaly MSK: extremities are warm, no edema.  Skin: warm, no rash Neuro:  no focal deficits Psych: appropriate affect   Diagnostic Studies 02/2010 Cath DESCRIPTION OF PROCEDURE: The risks and indication were explained. Consent was signed and placed on the chart. A 4-French arterial sheath was placed in the right femoral artery using a modified Seldinger technique. A standard catheters including a JL-4, 3DRC, and angled pigtail were used. All catheter exchanges were made over wire. There were no apparent complications. Central aortic pressure 109/67 with a mean of 86. LV pressure 110/70 with an EDP of 28. There was no aortic stenosis.  Left main was normal.  LAD was a long vessel coursing to the apex, it gave off a moderate-sized diagonal Izzy Courville. There was a 40% lesion in the proximal LAD, otherwise minimal luminal irregularities.  Left circumflex was a large dominant vessel, gave off a  narrow caliber ramus, large OM-1, and narrow caliber OM-2 in several posterolaterals. In the proximal left circumflex, he had a 40% lesion which extended into the proximal portion of the OM-1. There was a 30% lesion in the mid OM- 1. In the OM-2, there was a  long 80% stenosis throughout the ostial and proximal portion. This vessel was very small caliber vessel.  Right coronary artery was a nondominant vessel, gave off an RV Longino Trefz. There was approximately 90% stenosis in the proximal portion. Once again, this was a very small nondominant vessel.  Left ventriculogram done in the RAO position showed an EF of 50-55% with inferior basilar akinesis. There was no significant mitral regurgitation.  Abdominal aortogram showed 40% ostial lesion in the right renal artery. The left renal artery was widely patent. There was no abdominal aortic aneurysm. On panning down over the iliac and femoral system, there was no high-grade lesions.  ASSESSMENT: 1. Stable coronary artery disease with high-grade lesions in the small  obtuse marginal 2 and right coronary artery both of which are too  small for percutaneous intervention. 2. Left ventricular ejection fraction was 50-55% with mildly elevated  filling pressures. 3. Very mild right renal artery stenosis. 4. No obvious high-grade proximal peripheral arterial disease.  PLAN/DISCUSSION: I suspect most of his dyspnea is likely due to his lung disease. I have urged him to stop smoking. We will continue medical therapy now for his heart disease.   08/2011 Echo Study Conclusions  - Left ventricle: The cavity size was normal. There was mild focal basal hypertrophy of the septum. The estimated ejection fraction was 55%. There is akinesis of the basalinferior myocardium. Doppler parameters are consistent with abnormal left ventricular relaxation (grade 1 diastolic dysfunction). - Mitral valve: Trivial regurgitation. - Left atrium: The atrium was at the upper limits of normal in size. - Tricuspid valve: Trivial regurgitation. - Pulmonary arteries: PA peak pressure: 72mm Hg (S). - Pericardium, extracardiac: There was no pericardial effusion.  09/2015 ABIs: 1.2  bilaterally   03/2016 echo Study Conclusions  - Left ventricle: The cavity size was normal. Wall thickness was normal. The estimated ejection fraction was 50%. There is akinesis of the basal-midinferolateral and inferior myocardium. Left ventricular diastolic function parameters were normal. - Aortic valve: Mildly calcified annulus. Trileaflet. - Mitral valve: Mildly thickened leaflets . There was trivial regurgitation. - Left atrium: The atrium was at the upper limits of normal in size. - Right atrium: Central venous pressure (est): 3 mm Hg. - Atrial septum: No defect or patent foramen ovale was identified. - Tricuspid valve: There was trivial regurgitation. - Pulmonary arteries: PA peak pressure: 9 mm Hg (S). - Pericardium, extracardiac: There was no pericardial effusion.  Impressions:  - Normal LV wall thickness with LVEF approximately 50%. There is akinesis of the mid to basal inferolateral/inferior wall. Normal diastolic function. Upper normal left atrial chamber size. Mildly thickened mitral leaflets with trivial mitral regurgitation. Mildly sclerotic aortic valve. Trivial tricuspid regurgitation.  08/2017 nuclear stress  There was no ST segment deviation noted during stress.  Findings consistent with large prior inferior/inferolateral myocardial infarction.  This is an intermediate risk study. Risk based on decreased LVEF, there is no current myocardium at jeopardy.  The left ventricular ejection fraction is mildly decreased (45%).    Assessment and Plan  1. CAD -no recent symptoms. Nuclear stress last year without ischemia Mild SOB possibly form weight gain or COPD, continue to monitor at this time.  - continue current medical therapy  2. Hyperlipidemia -LDL at goal, continue staitn. Discusse dietary and exercise changes to improved TGs  3. HTN - home number unclear accuracy, higher DBP than would think given the reported SBP. His bp  with pcp last month was at goal, conitnue current meds      Arnoldo Lenis, M.D.

## 2020-01-16 ENCOUNTER — Ambulatory Visit: Payer: Medicare PPO | Admitting: Family Medicine

## 2020-01-16 ENCOUNTER — Other Ambulatory Visit: Payer: Self-pay

## 2020-01-16 ENCOUNTER — Encounter: Payer: Self-pay | Admitting: Family Medicine

## 2020-01-16 VITALS — BP 120/88 | Temp 97.5°F | Wt 175.6 lb

## 2020-01-16 DIAGNOSIS — R251 Tremor, unspecified: Secondary | ICD-10-CM

## 2020-01-16 DIAGNOSIS — R413 Other amnesia: Secondary | ICD-10-CM | POA: Diagnosis not present

## 2020-01-16 DIAGNOSIS — F418 Other specified anxiety disorders: Secondary | ICD-10-CM

## 2020-01-16 DIAGNOSIS — I10 Essential (primary) hypertension: Secondary | ICD-10-CM | POA: Diagnosis not present

## 2020-01-16 DIAGNOSIS — E785 Hyperlipidemia, unspecified: Secondary | ICD-10-CM | POA: Diagnosis not present

## 2020-01-16 DIAGNOSIS — Z79891 Long term (current) use of opiate analgesic: Secondary | ICD-10-CM

## 2020-01-16 MED ORDER — HYDROCODONE-ACETAMINOPHEN 7.5-325 MG PO TABS: 1.0000 | ORAL_TABLET | Freq: Two times a day (BID) | ORAL | 0 refills | Status: DC | PRN

## 2020-01-16 MED ORDER — HYDROCODONE-ACETAMINOPHEN 7.5-325 MG PO TABS: ORAL_TABLET | ORAL | 0 refills | Status: DC

## 2020-01-16 NOTE — Progress Notes (Signed)
Subjective:    Patient ID: [name], male    DOB: 04-10-1956, 64 y.o.   MRN: [id number]  HPI This patient was seen today for chronic pain  The medication list was reviewed and updated.   -Compliance with medication: yes  - Number patient states they take daily: 2 per day   -when was the last dose patient took? This morning  The patient was advised the importance of maintaining medication and not using illegal substances with these.  Here for refills and follow up  The patient was educated that we can provide 3 monthly scripts for their medication, it is their responsibility to follow the instructions.  Side effects or complications from medications: Denies side effects of medication.  Patient is aware that pain medications are meant to minimize the severity of the pain to allow their pain levels to improve to allow for better function. They are aware of that pain medications cannot totally remove their pain.  Due for UDT ( at least once per year) : done today  Scale of 1 to 10 ( 1 is least 10 is most) Your pain level without the medicine: Relates his pain level is under fair control with medicine without medicine it is around 8 Your pain level with medication with the medication and is around a 3  Scale 1 to 10 ( 1-helps very little, 10 helps very well) How well does your pain medication reduce your pain so you can function better through out the day?  7  Encounter for long-term opiate analgesic use - Plan: ToxASSURE Select 13 (MW), Urine  Essential hypertension  Hyperlipidemia, unspecified hyperlipidemia type  Depression with anxiety  Short-term memory loss  Tremor  Patient does pass his mini cog He is trying to watch diet.  Takes his medication regular basis.  Minimizes salt in the diet. Patient does take cholesterol medicine tries to watch his portions tries to stay healthy Patient has underlying depression and anxiety he feels it is doing okay He does  relate cognitive dysfunction has times where he has difficult time with his memory other times difficult time thinking about what he was going to be doing he states his hits him intermittently.  Patient has complex psychiatric illness and therefore is on multiple medications. He does have underlying coronary artery disease but denies any type of chest tightness pressure pain He also has osteoarthritis for which he takes meloxicam denies any bleeding issues Also has restless legs taking his medicine states is doing okay Also under some stress because his grandson is now under the care of the other grandfather-for Patient also with some intermittent tremor of the hands but he states this does better after taking Xanax    Review of Systems  Constitutional: Positive for fatigue. Negative for diaphoresis.  HENT: Negative for congestion and rhinorrhea.   Respiratory: Negative for cough and shortness of breath.   Cardiovascular: Negative for chest pain and leg swelling.  Gastrointestinal: Negative for abdominal pain and diarrhea.  Skin: Negative for color change and rash.  Neurological: Positive for tremors. Negative for dizziness and headaches.  Psychiatric/Behavioral: Negative for behavioral problems and confusion.       Relates some memory issues also relates some difficulty with cognitive skills at times       Objective:   Physical Exam Vitals reviewed.  Constitutional:      General: He is not in acute distress. HENT:     Head: Normocephalic and atraumatic.  Eyes:  General:        Right eye: No discharge.        Left eye: No discharge.  Neck:     Trachea: No tracheal deviation.  Cardiovascular:     Rate and Rhythm: Normal rate and regular rhythm.     Heart sounds: Normal heart sounds. No murmur heard.   Pulmonary:     Effort: Pulmonary effort is normal. No respiratory distress.     Breath sounds: Normal breath sounds.  Lymphadenopathy:     Cervical: No cervical adenopathy.   Skin:    General: Skin is warm and dry.  Neurological:     Mental Status: He is alert.     Coordination: Coordination normal.  Psychiatric:        Behavior: Behavior normal.    Negative cogwheel Negative tremors on exam        Assessment & Plan:  1. Encounter for long-term opiate analgesic use The patient was seen in followup for chronic pain. A review over at their current pain status was discussed. Drug registry was checked. Prescriptions were given.  Regular follow-up recommended. Discussion was held regarding the importance of compliance with medication as well as pain medication contract.  Patient was informed that medication may cause drowsiness and should not be combined  with other medications/alcohol or street drugs. If the patient feels medication is causing altered alertness then do not drive or operate dangerous equipment.  Drug registry was checked 3 prescription sent in I believe the patient does benefit from the pain medicine allows him to be more functional - ToxASSURE Select 13 (MW), Urine Results for orders placed or performed in visit on 10/22/19  PSA  Result Value Ref Range   Prostate Specific Ag, Serum 1.2 0.0 - 4.0 ng/mL  Basic metabolic panel  Result Value Ref Range   Glucose 96 65 - 99 mg/dL   BUN 10 8 - 27 mg/dL   Creatinine, Ser 0.79 0.76 - 1.27 mg/dL   GFR calc non Af Amer 96 >59 mL/min/1.73   GFR calc Af Amer 110 >59 mL/min/1.73   BUN/Creatinine Ratio 13 10 - 24   Sodium 144 134 - 144 mmol/L   Potassium 3.8 3.5 - 5.2 mmol/L   Chloride 107 (H) 96 - 106 mmol/L   CO2 22 20 - 29 mmol/L   Calcium 9.1 8.6 - 10.2 mg/dL    2. Essential hypertension Blood pressure decent control continue current measures ideally diastolic should be around 80 or less patient to do a better job with sitting and walking and minimizing salt  3. Hyperlipidemia, unspecified hyperlipidemia type Continue cholesterol medicine.  4. Depression with anxiety I am concerned  about this patient I feel he is doing well with his medicines but his cognitive decline could be age-related could be even mini strokes but it is also possible it could be medications I have encouraged the patient to discuss with his psychiatrist his gabapentin and Xanax because he states he does not take the full dose on a regular basis possibly he could be on last?  On follow-up if he has continued cognitive decline we will pursue forward with perhaps CT or MRI  5. Short-term memory loss Please see above mini cog negative  6. Tremor No sign of Parkinson's on today's exam  30 minutes was spent with the patient including previsit chart review, time spent with patient, discussion of health issues, review of data including medical record, and documentation of the visit.

## 2020-01-17 ENCOUNTER — Encounter: Payer: Self-pay | Admitting: Gastroenterology

## 2020-01-17 NOTE — Progress Notes (Deleted)
Referring Provider: [name], MD Primary Care Physician:  [name], MD Primary GI Physician: Dr. Rayne Du chief complaint on file.   HPI:   [name] is a 64 y.o. male presenting today with a history of history of symptomatic hemorrhoids, previously banded in 2015 X 3 with good results initially but had recurrent symptoms.  Colonoscopy July 2019 with nonbleeding internal hemorrhoids, tubular adenomas, due for repeat in 2024.  More recently with hemorrhoid banding x4 in 2020.  History of chronic diarrhea with small bowel biopsies negative for celiac disease, negative random colon biopsies remotely.  Bentyl not helpful.  More recently has been prescribed Creon which provided improvement.  Also with history of erosive reflux esophagitis and dysphagia with several dilations in the past.  He presents today for***   Last seen in our office February 2021.  Creon has helped tremendously.  He is having 2 BMs per day, down from 5-6/day with postprandial urgency.  Taking Protonix once a day.  Noted solid food dysphagia for the past few months.  He was scheduled for EGD and advised to continue current medications.  EGD 09/17/2019 with nonobstructing Schatzki ring s/p dilation, medium size hiatal hernia, otherwise normal exam.  Today:  Dysphagia: GERD: Diarrhea: Hemorrhoids:   Past Medical History:  Diagnosis Date   Anxiety    Anxiety and depression    ASCVD (arteriosclerotic cardiovascular disease)    a. s/p PTCA to LCx in 1993 b. BMS to RCA in 01/2012 with residual 80% OM2 stenosis and medical management recommended c. 08/2019: cath showing patent stents with occluded OM2 --> medical management.    Asthma    CAD (coronary artery disease)    Carcinoma in situ of colon 2004   rectal polyp   Colitis, ischemic ([place]) 2011   COPD (chronic obstructive pulmonary disease) (HCC)    mild ;excercise induced hypoxemia by cp stress test ;asthma ,bronchitis,   Depression     Diverticulosis    Gastritis 12/30/10   EGD Dr [name]   GERD (gastroesophageal reflux disease)    GI bleed    H. 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)    Leukocytosis    [name]   Lung nodule 07/16/2011   Myocardial infarction [facility])    age 79   Restless leg syndrome    Schatzki's ring    Sleep apnea    does not use CPAP:cannot tolerate, PCP aware   Syncope    Tobacco abuse    50 pack years continuing at one halp pack daily   Tubular adenoma of colon 06/2009   Colonosocpy Dr [name]    Past Surgical History:  Procedure Laterality Date   BIOPSY  12/19/2017   Procedure: BIOPSY;  Surgeon: [name], MD;  [facility];  Service: Endoscopy;;  gastric   CARDIAC CATHETERIZATION     CHOLECYSTECTOMY  2004   COLONOSCOPY  06/2009   normal terminal ileum, segmental mild inflammation of sigmoid colon (bx unremarkable), polyp, tubular adenoma   COLONOSCOPY N/A 07/31/2013   Dr.Rourk- redundant anal canal hemorrhoids, colonic diverticulosis, tubular adenoma   COLONOSCOPY W/ POLYPECTOMY  2004   rectal polyp with carcinoma in situ removed via colonoscopy   COLONOSCOPY WITH PROPOFOL N/A 09/15/2015   Dr. [name]: Scattered diverticula throughout the colon, 2 sessile polyps found  in the descending colon and cecum, 5 mm in size.  Cecal polyp was sessile serrated polyp, descending colon polyp was a tubular adenoma.  He had a abnormal perianal exam along with grade 3 hemorrhoids.  Surveillance exam recommended for 5-year follow-up.   COLONOSCOPY WITH PROPOFOL N/A 12/19/2017   one 5 mm polyp in ascending colon and 1 cm sessile polyp in ascending s/p removal. Tubular adenoma. internal hemorrhoids   CORONARY ANGIOPLASTY WITH STENT PLACEMENT  01/26/2012   "1; total is now 2"   ESOPHAGOGASTRODUODENOSCOPY  02/2009   query Barrett's but bx negative     ESOPHAGOGASTRODUODENOSCOPY  12/30/2010   [name],gastritis, dilated 91F, sm HH, 1 small ulcer, Duodenal erosions, benign bx   ESOPHAGOGASTRODUODENOSCOPY (EGD) WITH ESOPHAGEAL DILATION N/A 09/12/2012   XNA:[id number] Schatzki's ring s/p Maloney dilator. Small hiatal hernia. negative path   ESOPHAGOGASTRODUODENOSCOPY (EGD) WITH ESOPHAGEAL DILATION N/A 07/31/2013   Dr. [name]- normal egd, s/p [facility] dilation empirically. Normal small bowel biopsies    ESOPHAGOGASTRODUODENOSCOPY (EGD) WITH PROPOFOL N/A 09/15/2015   Dr. [name]: Medium sized hiatal hernia, normal-appearing esophagus status post empiric dilation   ESOPHAGOGASTRODUODENOSCOPY (EGD) WITH PROPOFOL N/A 12/19/2017   erosive esophagitis s/p dilation, erosive gastropathy, normal duodenum   ESOPHAGOGASTRODUODENOSCOPY (EGD) WITH PROPOFOL N/A 09/17/2019   Procedure: ESOPHAGOGASTRODUODENOSCOPY (EGD) WITH PROPOFOL;  Surgeon: [name], MD;  [facility];  Service: Endoscopy;  Laterality: N/A;  2:45pm - pt knows to arrive at 12:45   [place]  12/30/2010    [name],; internal hemorrhoids, anal papilla   HAND SURGERY     surgical intervention for injury of the fingers of the left hand many years ago   heart stent     HEMORRHOID BANDING     Dr. [name]   LEFT HEART CATH AND CORONARY ANGIOGRAPHY N/A 08/29/2019   Procedure: LEFT HEART CATH AND CORONARY ANGIOGRAPHY;  Surgeon: [name], MD;  Location: [place] [facility];  Service: Cardiovascular;  Laterality: N/A;   LEFT HEART CATHETERIZATION WITH CORONARY ANGIOGRAM N/A 01/26/2012   Procedure: LEFT HEART CATHETERIZATION WITH CORONARY ANGIOGRAM;  Surgeon: [name], MD;  Location[facility] C[facility] LAB;  Service: Cardiovascular;  Laterality: N/A;   MALONEY DILATION N/A 09/15/2015   Procedure: [name] DILATION;  Surgeon: [name], MD;  [facility];  Service: Endoscopy;  Laterality: N/A;   MALONEY DILATION N/A 12/19/2017   Procedure: [name]  DILATION;  Surgeon: [name], MD;  [facility];  Service: Endoscopy;  Laterality: N/A;   MALONEY DILATION N/A 09/17/2019   Procedure: [name] DILATION;  Surgeon: [name], MD;  [facility];  Service: Endoscopy;  Laterality: N/A;   NASAL SEPTOPLASTY W/ TURBINOPLASTY  10/06/2011   Procedure: NASAL SEPTOPLASTY WITH TURBINATE REDUCTION;  Surgeon: Izora Gala, MD;  Location: [place];  Service: ENT;  Laterality: Bilateral;   PERCUTANEOUS CORONARY STENT INTERVENTION (PCI-S) N/A 01/26/2012   Procedure: PERCUTANEOUS CORONARY STENT INTERVENTION (PCI-S);  Surgeon: [name], MD;  Location: [facility] CATH LAB;  Service: Cardiovascular;  Laterality: N/A;   POLYPECTOMY  09/15/2015   Procedure: POLYPECTOMY;  Surgeon: [name], MD;  [facility];  Service: Endoscopy;;  Cecal polyp removed via cold snare/ Descending colon polyp removed via cold snare   POLYPECTOMY  12/19/2017   Procedure: POLYPECTOMY;  Surgeon: [name], MD;  [facility];  Service: Endoscopy;;  colon    Current Outpatient Medications  Medication Sig Dispense Refill  albuterol (PROVENTIL) (2.5 MG/3ML) 0.083% nebulizer solution Take 3 mLs (2.5 mg total) by nebulization every 6 (six) hours as needed for wheezing or shortness of breath. 75 mL 1   albuterol (VENTOLIN HFA) 108 (90 Base) MCG/ACT inhaler Inhale 2 puffs into the lungs every 4 (four) hours as needed for wheezing or shortness of breath. 18 g 1   ALPRAZolam (XANAX) 1 MG tablet Take 1 tablet (1 mg total) by mouth 4 (four) times daily. 120 tablet 2   aspirin EC 81 MG tablet Take 81 mg by mouth daily.     atorvastatin (LIPITOR) 80 MG tablet TAKE 1 TABLET BY MOUTH EVERY DAY (Patient taking differently: Take 80 mg by mouth daily. ) 90 tablet 3   budesonide-formoterol (SYMBICORT) 80-4.5 MCG/ACT inhaler Take 2 puffs first thing in am and then another 2 puffs about 12 hours later. 1 Inhaler 12   dicyclomine (BENTYL) 10 MG  capsule TAKE 1 CAPSULE (10 MG TOTAL) BY MOUTH 4 (FOUR) TIMES DAILY - BEFORE MEALS AND AT BEDTIME. 360 capsule 1   FLUoxetine (PROZAC) 20 MG capsule Take 1 capsule (20 mg total) by mouth daily. 90 capsule 2   FLUoxetine (PROZAC) 40 MG capsule Take 1 capsule (40 mg total) by mouth daily. Take with 20 mg to equal 60 mg daily 90 capsule 2   fluticasone (FLONASE) 50 MCG/ACT nasal spray Place 2 sprays into both nostrils daily. (Patient taking differently: Place 2 sprays into both nostrils daily as needed for allergies. ) 16 g 5   gabapentin (NEURONTIN) 300 MG capsule Take 2 capsules (600 mg total) by mouth 3 (three) times daily. 540 capsule 2   HYDROcodone-acetaminophen (NORCO) 7.5-325 MG tablet Take 1 tablet by mouth 2 (two) times daily as needed for moderate pain. 60 tablet 0   HYDROcodone-acetaminophen (NORCO) 7.5-325 MG tablet Take one tablet by mouth 2 (two) times daily as needed for moderate pain 60 tablet 0   HYDROcodone-acetaminophen (NORCO) 7.5-325 MG tablet Take one tablet by mouth 2 ( two ) times daily as needed for moderate pain 60 tablet 0   hydrocortisone (ANUSOL-HC) 2.5 % rectal cream Place 1 application rectally 2 (two) times daily. 30 g 1   isosorbide mononitrate (IMDUR) 30 MG 24 hr tablet Take 1 tablet (30 mg total) by mouth daily. 90 tablet 3   lipase/protease/amylase (CREON) 36000 UNITS CPEP capsule Take 2 capsules (72,000 Units total) by mouth 3 (three) times daily with meals. 1 with snacks 240 capsule 3   meloxicam (MOBIC) 15 MG tablet TAKE 1 TABLET BY MOUTH EVERY DAY (Patient taking differently: Take 15 mg by mouth daily. ) 30 tablet 5   metoprolol succinate (TOPROL-XL) 50 MG 24 hr tablet TAKE 1 TABLET BY MOUTH EVERY DAY 90 tablet 1   nitroGLYCERIN (NITROSTAT) 0.4 MG SL tablet Place 0.4 mg under the tongue every 5 (five) minutes as needed for chest pain.     OLANZapine (ZYPREXA) 10 MG tablet Take 1 tablet (10 mg total) by mouth at bedtime. 90 tablet 2   pantoprazole  (PROTONIX) 40 MG tablet Take 1 tablet (40 mg total) by mouth 2 (two) times daily before a meal. 180 tablet 3   rOPINIRole (REQUIP) 2 MG tablet TAKE 1 TABLET BY MOUTH DAILY AT BEDTIME (Patient taking differently: Take 2 mg by mouth at bedtime. ) 90 tablet 2   telmisartan (MICARDIS) 40 MG tablet Take 1 tablet (40 mg total) by mouth daily. 30 tablet 11   No current facility-administered medications for this visit.  Allergies as of 01/18/2020   (No Known Allergies)    Family History  Problem Relation Age of Onset   Lung disease Father        deceased, black lung   Heart disease Mother        blood clots   Depression Mother    Cancer Paternal Uncle        unknown type   Cancer Maternal Aunt        unknown type   Kidney failure Maternal Uncle    Hypertension Brother    Colon cancer Neg Hx    ADD / ADHD Neg Hx    Alcohol abuse Neg Hx    Drug abuse Neg Hx    Anxiety disorder Neg Hx    Bipolar disorder Neg Hx    Dementia Neg Hx    OCD Neg Hx    Paranoid behavior Neg Hx    Schizophrenia Neg Hx    Physical abuse Neg Hx    Sexual abuse Neg Hx    Seizures Neg Hx     Social History   Socioeconomic History   Marital status: Married    Spouse name: Not on file   Number of children: 3   Years of education: Not on file   Highest education level: Not on file  Occupational History   Occupation: disable    Employer: RETIRED    Comment: [employer]  Tobacco Use   Smoking status: Current Every Day Smoker    Packs/day: 1.00    Years: 48.00    Pack years: 48.00    Types: Cigarettes    Start date: 07/31/1969   Smokeless tobacco: Former Counsellor Use: Never used  Substance and Sexual Activity   Alcohol use: Yes    Comment: Drinks a beer occasionally   Drug use: No   Sexual activity: Not Currently  Other Topics Concern   Not on file  Social History Narrative   3 stepchildren   Social Determinants of Health   Financial Resource  Strain:    Difficulty of Paying Living Expenses:   Food Insecurity:    Worried About Charity fundraiser in the Last Year:    Arboriculturist in the Last Year:   Transportation Needs:    Film/video editor (Medical):    Lack of Transportation (Non-Medical):   Physical Activity:    Days of Exercise per Week:    Minutes of Exercise per Session:   Stress:    Feeling of Stress :   Social Connections:    Frequency of Communication with Friends and Family:    Frequency of Social Gatherings with Friends and Family:    Attends Religious Services:    Active Member of Clubs or Organizations:    Attends Archivist Meetings:    Marital Status:     Review of Systems: Gen: Denies fever, chills, anorexia. Denies fatigue, weakness, weight loss.  CV: Denies chest pain, palpitations, syncope, peripheral edema, and claudication. Resp: Denies dyspnea at rest, cough, wheezing, coughing up blood, and pleurisy. GI: Denies vomiting blood, jaundice, and fecal incontinence.   Denies dysphagia or odynophagia. Derm: Denies rash, itching, dry skin Psych: Denies depression, anxiety, memory loss, confusion. No homicidal or suicidal ideation.  Heme: Denies bruising, bleeding, and enlarged lymph nodes.  Physical Exam: There were no vitals taken for this visit. General:   Alert and oriented. No distress noted. Pleasant and cooperative.  Head:  Normocephalic and  atraumatic. Eyes:  Conjuctiva clear without scleral icterus. Mouth:  Oral mucosa pink and moist. Good dentition. No lesions. Heart:  S1, S2 present without murmurs appreciated. Lungs:  Clear to auscultation bilaterally. No wheezes, rales, or rhonchi. No distress.  Abdomen:  +BS, soft, non-tender and non-distended. No rebound or guarding. No HSM or masses noted. Msk:  Symmetrical without gross deformities. Normal posture. Extremities:  Without edema. Neurologic:  Alert and  oriented x4 Psych:  Alert and cooperative. Normal  mood and affect.

## 2020-01-18 ENCOUNTER — Ambulatory Visit: Payer: Medicare PPO | Admitting: Gastroenterology

## 2020-01-18 ENCOUNTER — Encounter: Payer: Self-pay | Admitting: *Deleted

## 2020-01-19 ENCOUNTER — Encounter: Payer: Self-pay | Admitting: Family Medicine

## 2020-01-19 LAB — TOXASSURE SELECT 13 (MW), URINE

## 2020-01-20 ENCOUNTER — Telehealth: Payer: Self-pay | Admitting: Family Medicine

## 2020-01-20 NOTE — Telephone Encounter (Signed)
Nurses Please reach out to the patient Please let the patient know that further evaluation of his medications it is now recommended to have Narcan nasal spray available for individuals who are on pain medication and nerve medications.  So therefore please send into his pharmacy Narcan nasal spray with 2 refills The patient can be told that he would not have to use this unless there was accidental overdose or he felt like he was too affected/drowsy from pain medicine.  Very unlikely that the patient will need to use this but it is wise to have available

## 2020-01-21 MED ORDER — NALOXONE HCL 4 MG/0.1ML NA LIQD
NASAL | 0 refills | Status: DC
Start: 2020-01-21 — End: 2021-08-12

## 2020-01-21 NOTE — Telephone Encounter (Signed)
Patient notified and verbalized understanding. Prescription sent electronically to pharmacy. ?

## 2020-01-21 NOTE — Telephone Encounter (Signed)
Left message to return call 

## 2020-01-21 NOTE — Addendum Note (Signed)
Addended by: Dairl Ponder on: 01/21/2020 11:38 AM   Modules accepted: Orders

## 2020-01-26 ENCOUNTER — Other Ambulatory Visit: Payer: Self-pay | Admitting: Family Medicine

## 2020-02-05 ENCOUNTER — Other Ambulatory Visit: Payer: Self-pay | Admitting: Gastroenterology

## 2020-02-05 ENCOUNTER — Ambulatory Visit: Payer: Medicare PPO | Admitting: Gastroenterology

## 2020-02-07 ENCOUNTER — Telehealth (INDEPENDENT_AMBULATORY_CARE_PROVIDER_SITE_OTHER): Payer: Medicare PPO | Admitting: Psychiatry

## 2020-02-07 ENCOUNTER — Other Ambulatory Visit: Payer: Self-pay

## 2020-02-07 ENCOUNTER — Encounter (HOSPITAL_COMMUNITY): Payer: Self-pay | Admitting: Psychiatry

## 2020-02-07 DIAGNOSIS — F418 Other specified anxiety disorders: Secondary | ICD-10-CM | POA: Diagnosis not present

## 2020-02-07 MED ORDER — OLANZAPINE 10 MG PO TABS: 10.0000 mg | ORAL_TABLET | Freq: Every day | ORAL | 2 refills | Status: DC

## 2020-02-07 MED ORDER — FLUOXETINE HCL 20 MG PO CAPS: 20.0000 mg | ORAL_CAPSULE | Freq: Every day | ORAL | 2 refills | Status: DC

## 2020-02-07 MED ORDER — FLUOXETINE HCL 40 MG PO CAPS: 40.0000 mg | ORAL_CAPSULE | Freq: Every day | ORAL | 2 refills | Status: DC

## 2020-02-07 MED ORDER — GABAPENTIN 300 MG PO CAPS: 600.0000 mg | ORAL_CAPSULE | Freq: Two times a day (BID) | ORAL | 2 refills | Status: DC

## 2020-02-07 MED ORDER — ALPRAZOLAM 1 MG PO TABS: 1.0000 mg | ORAL_TABLET | Freq: Three times a day (TID) | ORAL | 2 refills | Status: DC | PRN

## 2020-02-07 NOTE — Progress Notes (Signed)
Virtual Visit via Telephone Note  I connected with [name] on 02/07/20 at  1:40 PM EDT by telephone and verified that I am speaking with the correct person using two identifiers.   I discussed the limitations, risks, security and privacy concerns of performing an evaluation and management service by telephone and the availability of in person appointments. I also discussed with the patient that there may be a patient responsible charge related to this service. The patient expressed understanding and agreed to proceed.    I discussed the assessment and treatment plan with the patient. The patient was provided an opportunity to ask questions and all were answered. The patient agreed with the plan and demonstrated an understanding of the instructions.   The patient was advised to call back or seek an in-person evaluation if the symptoms worsen or if the condition fails to improve as anticipated.  I provided 15 minutes of non-face-to-face time during this encounter. Patient: Provider Home, patient home  [name] Spiller, MD  [facility] MD/PA/NP OP Progress Note  02/07/2020 2:22 PM [name]  MRN:  [id number]  Chief Complaint:  Chief Complaint    Anxiety; Depression; Follow-up     HPI: This patient is a 64 year old married white male lives with his wife and grandchildren in [place].  He is on disability for coronary artery disease and COPD.  He used to work for [employer].  The patient returns to follow-up after about 3 months.  He states he has had a rough time lately.  He and his wife have been taking care of his grandchildren.  They are the children of his adult daughter who killed herself 3 years ago on their property by hanging.  The older child is 14 years old and her father came and took her about 10 days ago and moved her to [place]  The patient is wife do not have legal custody.  I stated I do not think there is a lot they can do although they could consult a lawyer.  The patient states  that this man has not yet enrolled the girl in school and is very concerned.  Overall however he is doing okay and is trying to work his way through it.  I noted in Dr. Lance Sell last note that he has had some cognitive decline and is concerned about overmedication.  I did cut back his gabapentin to 600 mg twice a day and Xanax to 1 mg only up to 3 a day.  He is more forgetful than he used to be.  He has had a number of mini strokes. Visit Diagnosis:    ICD-10-CM   1. Depression with anxiety  F41.8     Past Psychiatric History:none  Past Medical History:  Past Medical History:  Diagnosis Date  . Anxiety   . Anxiety and depression   . ASCVD (arteriosclerotic cardiovascular disease)    a. s/p PTCA to LCx in 1993 b. BMS to RCA in 01/2012 with residual 80% OM2 stenosis and medical management recommended c. 08/2019: cath showing patent stents with occluded OM2 --> medical management.   . Asthma   . CAD (coronary artery disease)   . Carcinoma in situ of colon 2004   rectal polyp  . Colitis, ischemic ([place]) 2011  . COPD (chronic obstructive pulmonary disease) (HCC)    mild ;excercise induced hypoxemia by cp stress test ;asthma ,bronchitis,  . Depression   . Diverticulosis   . Gastritis 12/30/10   EGD Dr [name]  .  GERD (gastroesophageal reflux disease)   . GI bleed   . H. pylori infection 2004   treated  . Hemorrhoids   . Hiatal hernia   . Hyperlipidemia   . Hypertension   . Inflammatory polyps of colon with rectal bleeding ([place])   . Leukocytosis    [name]  . Lung nodule 07/16/2011  . Myocardial infarction [facility])    age 64  . Restless leg syndrome   . Schatzki's ring   . Sleep apnea    does not use CPAP:cannot tolerate, PCP aware  . Syncope   . Tobacco abuse    50 pack years continuing at one halp pack daily  . Tubular adenoma of colon 06/2009   Colonosocpy Dr [name]    Past Surgical History:  Procedure Laterality Date  . BIOPSY  12/19/2017   Procedure: BIOPSY;   Surgeon: [name], MD;  [facility];  Service: Endoscopy;;  gastric  . CARDIAC CATHETERIZATION    . CHOLECYSTECTOMY  2004  . COLONOSCOPY  06/2009   normal terminal ileum, segmental mild inflammation of sigmoid colon (bx unremarkable), polyp, tubular adenoma  . COLONOSCOPY N/A 07/31/2013   Dr.Rourk- redundant anal canal hemorrhoids, colonic diverticulosis, tubular adenoma  . COLONOSCOPY W/ POLYPECTOMY  2004   rectal polyp with carcinoma in situ removed via colonoscopy  . COLONOSCOPY WITH PROPOFOL N/A 09/15/2015   Dr. [name]: Scattered diverticula throughout the colon, 2 sessile polyps found in the descending colon and cecum, 5 mm in size.  Cecal polyp was sessile serrated polyp, descending colon polyp was a tubular adenoma.  He had a abnormal perianal exam along with grade 3 hemorrhoids.  Surveillance exam recommended for 5-year follow-up.  . COLONOSCOPY WITH PROPOFOL N/A 12/19/2017   one 5 mm polyp in ascending colon and 1 cm sessile polyp in ascending s/p removal. Tubular adenoma. internal hemorrhoids  . CORONARY ANGIOPLASTY WITH STENT PLACEMENT  01/26/2012   "1; total is now 2"  . ESOPHAGOGASTRODUODENOSCOPY  02/2009   query Barrett's but bx negative  . ESOPHAGOGASTRODUODENOSCOPY  12/30/2010   [name],gastritis, dilated 36F, sm HH, 1 small ulcer, Duodenal erosions, benign bx  . ESOPHAGOGASTRODUODENOSCOPY (EGD) WITH ESOPHAGEAL DILATION N/A 09/12/2012   MWN:[id number] Schatzki's ring s/p Maloney dilator. Small hiatal hernia. negative path  . ESOPHAGOGASTRODUODENOSCOPY (EGD) WITH ESOPHAGEAL DILATION N/A 07/31/2013   Dr. [name]- normal egd, s/p [facility] dilation empirically. Normal small bowel biopsies   . ESOPHAGOGASTRODUODENOSCOPY (EGD) WITH PROPOFOL N/A 09/15/2015   Dr. [name]: Medium sized hiatal hernia, normal-appearing esophagus status post empiric dilation  . ESOPHAGOGASTRODUODENOSCOPY (EGD) WITH PROPOFOL N/A 12/19/2017   erosive esophagitis s/p dilation, erosive gastropathy,  normal duodenum  . ESOPHAGOGASTRODUODENOSCOPY (EGD) WITH PROPOFOL N/A 09/17/2019   Procedure: ESOPHAGOGASTRODUODENOSCOPY (EGD) WITH PROPOFOL;  Surgeon: [name], MD;  nonobstructing Schatzki ring s/p dilation, medium size hiatal hernia, otherwise normal exam.  . FLEXIBLE SIGMOIDOSCOPY  12/30/2010    [name],; internal hemorrhoids, anal papilla  . HAND SURGERY     surgical intervention for injury of the fingers of the left hand many years ago  . heart stent    . HEMORRHOID BANDING     Dr. [name]  . LEFT HEART CATH AND CORONARY ANGIOGRAPHY N/A 08/29/2019   Procedure: LEFT HEART CATH AND CORONARY ANGIOGRAPHY;  Surgeon: [name], MD;  Location: [place] [facility] LAB;  Service: Cardiovascular;  Laterality: N/A;  . LEFT HEART CATHETERIZATION WITH CORONARY ANGIOGRAM N/A 01/26/2012   Procedure: LEFT HEART CATHETERIZATION WITH CORONARY  ANGIOGRAM;  Surgeon: [name], MD;  Location: [facility] CATH LAB;  Service: Cardiovascular;  Laterality: N/A;  . [name] DILATION N/A 09/15/2015   Procedure: [name] DILATION;  Surgeon: [name], MD;  [facility];  Service: Endoscopy;  Laterality: N/A;  . [name] DILATION N/A 12/19/2017   Procedure: [name] DILATION;  Surgeon: [name], MD;  [facility];  Service: Endoscopy;  Laterality: N/A;  . [name] DILATION N/A 09/17/2019   Procedure: [name] DILATION;  Surgeon: [name], MD;  [facility];  Service: Endoscopy;  Laterality: N/A;  . NASAL SEPTOPLASTY W/ TURBINOPLASTY  10/06/2011   Procedure: NASAL SEPTOPLASTY WITH TURBINATE REDUCTION;  Surgeon: Izora Gala, MD;  Location: [facility];  Service: ENT;  Laterality: Bilateral;  . PERCUTANEOUS CORONARY STENT INTERVENTION (PCI-S) N/A 01/26/2012   Procedure: PERCUTANEOUS CORONARY STENT INTERVENTION (PCI-S);  Surgeon: [name], MD;  Location: [facility] CATH LAB;  Service: Cardiovascular;  Laterality: N/A;  . POLYPECTOMY  09/15/2015   Procedure: POLYPECTOMY;  Surgeon: [name], MD;  [facility];  Service: Endoscopy;;  Cecal polyp removed via cold snare/ Descending colon polyp removed via cold snare  . POLYPECTOMY  12/19/2017   Procedure: POLYPECTOMY;  Surgeon: [name], MD;  [facility];  Service: Endoscopy;;  colon    Family Psychiatric History: see below  Family History:  Family History  Problem Relation Age of Onset  . Lung disease Father        deceased, black lung  . Heart disease Mother        blood clots  . Depression Mother   . Cancer Paternal Uncle        unknown type  . Cancer Maternal Aunt        unknown type  . Kidney failure Maternal Uncle   . Hypertension Brother   . Colon cancer Neg Hx   . ADD / ADHD Neg Hx   . Alcohol abuse Neg Hx   . Drug abuse Neg Hx   . Anxiety disorder Neg Hx   . Bipolar disorder Neg Hx   . Dementia Neg Hx   . OCD Neg Hx   . Paranoid behavior Neg Hx   . Schizophrenia Neg Hx   . Physical abuse Neg Hx   . Sexual abuse Neg Hx   . Seizures Neg Hx     Social History:  Social History   Socioeconomic History  . Marital status: Married    Spouse name: Not on file  . Number of children: 3  . Years of education: Not on file  . Highest education level: Not on file  Occupational History  . Occupation: disable    Employer: RETIRED    Comment: [employer]  Tobacco Use  . Smoking status: Current Every Day Smoker    Packs/day: 1.00    Years: 48.00    Pack years: 48.00    Types: Cigarettes    Start date: 07/31/1969  . Smokeless tobacco: Former Network engineer  . Vaping Use: Never used  Substance and Sexual Activity  . Alcohol use: Yes    Comment: Drinks a beer occasionally  . Drug use: No  . Sexual activity: Not Currently  Other Topics Concern  . Not on file  Social History Narrative   3 stepchildren   Social Determinants of Health   Financial Resource Strain:   . Difficulty of Paying Living Expenses: Not on file  Food Insecurity:   .  Worried About Charity fundraiser  in the Last Year: Not on file  . Ran Out of Food in the Last Year: Not on file  Transportation Needs:   . Lack of Transportation (Medical): Not on file  . Lack of Transportation (Non-Medical): Not on file  Physical Activity:   . Days of Exercise per Week: Not on file  . Minutes of Exercise per Session: Not on file  Stress:   . Feeling of Stress : Not on file  Social Connections:   . Frequency of Communication with Friends and Family: Not on file  . Frequency of Social Gatherings with Friends and Family: Not on file  . Attends Religious Services: Not on file  . Active Member of Clubs or Organizations: Not on file  . Attends Archivist Meetings: Not on file  . Marital Status: Not on file    Allergies: No Known Allergies  Metabolic Disorder Labs: Lab Results  Component Value Date   HGBA1C 5.3 07/24/2015   MPG 103 10/29/2013   MPG 97 04/30/2013   No results found for: PROLACTIN Lab Results  Component Value Date   CHOL 129 08/29/2019   TRIG 413 (H) 08/29/2019   HDL 31 (L) 08/29/2019   CHOLHDL 4.2 08/29/2019   VLDL UNABLE TO CALCULATE IF TRIGLYCERIDE OVER 400 mg/dL 08/29/2019   LDLCALC UNABLE TO CALCULATE IF TRIGLYCERIDE OVER 400 mg/dL 08/29/2019   LDLCALC 43 12/13/2017   Lab Results  Component Value Date   TSH 1.011 02/09/2010    Therapeutic Level Labs: No results found for: LITHIUM No results found for: VALPROATE No components found for:  CBMZ  Current Medications: Current Outpatient Medications  Medication Sig Dispense Refill  . meloxicam (MOBIC) 15 MG tablet TAKE 1 TABLET BY MOUTH EVERY DAY 30 tablet 5  . albuterol (PROVENTIL) (2.5 MG/3ML) 0.083% nebulizer solution Take 3 mLs (2.5 mg total) by nebulization every 6 (six) hours as needed for wheezing or shortness of breath. 75 mL 1  . albuterol (VENTOLIN HFA) 108 (90 Base) MCG/ACT inhaler Inhale 2 puffs into the lungs every 4 (four) hours as needed for wheezing or shortness of breath. 18 g 1  . ALPRAZolam  (XANAX) 1 MG tablet Take 1 tablet (1 mg total) by mouth 3 (three) times daily as needed for anxiety. 90 tablet 2  . aspirin EC 81 MG tablet Take 81 mg by mouth daily.    Marland Kitchen atorvastatin (LIPITOR) 80 MG tablet TAKE 1 TABLET BY MOUTH EVERY DAY (Patient taking differently: Take 80 mg by mouth daily. ) 90 tablet 3  . budesonide-formoterol (SYMBICORT) 80-4.5 MCG/ACT inhaler Take 2 puffs first thing in am and then another 2 puffs about 12 hours later. 1 Inhaler 12  . CREON 36000-114000 units CPEP capsule TAKE 2 CAPSULES BY MOUTH THREE TIMES DAILY WITH MEALS. 1 CAPSULE WITH SNACKS 240 capsule 3  . dicyclomine (BENTYL) 10 MG capsule TAKE 1 CAPSULE (10 MG TOTAL) BY MOUTH 4 (FOUR) TIMES DAILY - BEFORE MEALS AND AT BEDTIME. 360 capsule 1  . FLUoxetine (PROZAC) 20 MG capsule Take 1 capsule (20 mg total) by mouth daily. 90 capsule 2  . FLUoxetine (PROZAC) 40 MG capsule Take 1 capsule (40 mg total) by mouth daily. Take with 20 mg to equal 60 mg daily 90 capsule 2  . fluticasone (FLONASE) 50 MCG/ACT nasal spray Place 2 sprays into both nostrils daily. (Patient taking differently: Place 2 sprays into both nostrils daily as needed for allergies. ) 16 g 5  .  gabapentin (NEURONTIN) 300 MG capsule Take 2 capsules (600 mg total) by mouth 2 (two) times daily. 480 capsule 2  . HYDROcodone-acetaminophen (NORCO) 7.5-325 MG tablet Take 1 tablet by mouth 2 (two) times daily as needed for moderate pain. 60 tablet 0  . HYDROcodone-acetaminophen (NORCO) 7.5-325 MG tablet Take one tablet by mouth 2 (two) times daily as needed for moderate pain 60 tablet 0  . HYDROcodone-acetaminophen (NORCO) 7.5-325 MG tablet Take one tablet by mouth 2 ( two ) times daily as needed for moderate pain 60 tablet 0  . hydrocortisone (ANUSOL-HC) 2.5 % rectal cream Place 1 application rectally 2 (two) times daily. 30 g 1  . isosorbide mononitrate (IMDUR) 30 MG 24 hr tablet Take 1 tablet (30 mg total) by mouth daily. 90 tablet 3  . metoprolol succinate  (TOPROL-XL) 50 MG 24 hr tablet TAKE 1 TABLET BY MOUTH EVERY DAY 90 tablet 1  . naloxone (NARCAN) nasal spray 4 mg/0.1 mL Use as directed 1 each 0  . nitroGLYCERIN (NITROSTAT) 0.4 MG SL tablet Place 0.4 mg under the tongue every 5 (five) minutes as needed for chest pain.    Marland Kitchen OLANZapine (ZYPREXA) 10 MG tablet Take 1 tablet (10 mg total) by mouth at bedtime. 90 tablet 2  . pantoprazole (PROTONIX) 40 MG tablet Take 1 tablet (40 mg total) by mouth 2 (two) times daily before a meal. 180 tablet 3  . rOPINIRole (REQUIP) 2 MG tablet TAKE 1 TABLET BY MOUTH DAILY AT BEDTIME (Patient taking differently: Take 2 mg by mouth at bedtime. ) 90 tablet 2  . telmisartan (MICARDIS) 40 MG tablet Take 1 tablet (40 mg total) by mouth daily. 30 tablet 11   No current facility-administered medications for this visit.     Musculoskeletal: Strength & Muscle Tone: within normal limits Gait & Station: normal Patient leans: N/A  Psychiatric Specialty Exam: Review of Systems  Musculoskeletal: Positive for arthralgias and back pain.  Psychiatric/Behavioral: Positive for dysphoric mood.  All other systems reviewed and are negative.   There were no vitals taken for this visit.There is no height or weight on file to calculate BMI.  General Appearance: NA  Eye Contact:  NA  Speech:  Clear and Coherent  Volume:  Normal  Mood:  Dysphoric  Affect:  NA  Thought Process:  Goal Directed  Orientation:  Full (Time, Place, and Person)  Thought Content: Rumination   Suicidal Thoughts:  No  Homicidal Thoughts:  No  Memory:  Immediate;   Poor Recent;   Fair Remote;   Fair  Judgement:  Fair  Insight:  Fair  Psychomotor Activity:  Decreased  Concentration:  Concentration: Fair and Attention Span: Fair  Recall:  [employer] of Knowledge: Fair  Language: Good  Akathisia:  No  Handed:  Right  AIMS (if indicated): not done  Assets:  Communication Skills Desire for Improvement Resilience Social Support Talents/Skills   ADL's:  Intact  Cognition: Impaired,  Mild  Sleep:  Fair   Screenings: GAD-7     Office Visit from 01/16/2020 in [place] Office Visit from 03/30/2018 in [place]  Total GAD-7 Score 13 4    PHQ2-9     Office Visit from 01/16/2020 in [place] Visit from 06/21/2019 in [place] Visit from 03/30/2018 in [place] Visit from 05/19/2017 in [place] Office Visit from 10/21/2015 in [place]  PHQ-2 Total Score 5 3 2  0 0  PHQ-9  Total Score 20 11 12  -- --       Assessment and Plan: This patient is a 64 year old male with a history of depression anxiety and posttraumatic stress disorder related to his daughter suicide.  He is on a fair amount of sedating medicine that may have impacted his memory.  We will cut down Xanax to 1 mg 3 times daily for anxiety and also cut down gabapentin to 600 mg twice daily also for anxiety.  He will continue Prozac 60 mg daily for depression and Zyprexa 10 mg at bedtime for mood stabilization.  He will return to see me in 3 months   [name] Spiller, MD 02/07/2020, 2:22 PM

## 2020-03-21 ENCOUNTER — Other Ambulatory Visit: Payer: Self-pay

## 2020-03-21 ENCOUNTER — Encounter: Payer: Self-pay | Admitting: Gastroenterology

## 2020-03-21 ENCOUNTER — Telehealth: Payer: Self-pay | Admitting: *Deleted

## 2020-03-21 ENCOUNTER — Ambulatory Visit: Payer: Medicare PPO | Admitting: Gastroenterology

## 2020-03-21 VITALS — BP 106/64 | HR 89 | Temp 97.5°F | Ht 70.0 in | Wt 166.0 lb

## 2020-03-21 DIAGNOSIS — K529 Noninfective gastroenteritis and colitis, unspecified: Secondary | ICD-10-CM | POA: Diagnosis not present

## 2020-03-21 DIAGNOSIS — R1310 Dysphagia, unspecified: Secondary | ICD-10-CM | POA: Diagnosis not present

## 2020-03-21 DIAGNOSIS — K219 Gastro-esophageal reflux disease without esophagitis: Secondary | ICD-10-CM | POA: Diagnosis not present

## 2020-03-21 NOTE — Progress Notes (Signed)
Referring Provider: [name], MD Primary Care Physician:  [name], MD Primary GI: Dr. [name]   Chief Complaint  Patient presents with  . Follow-up    doing well     HPI:   [name] is a 64 y.o. male presenting today with a history of tubular adenomas and surveillance due in 2024, internal hemorrhoids s/p banding in 2015 and then most recently late last year with excellent results, erosive esophagitis, most recently EGD due to dysphagia. Non-obstructing Schatzki's ring s/p dilation, medium-sized hiatal hernia, otherwise normal.  Chronic diarrhea has responded quite well to Creon empirically. Celiac serologies negative in 2012. Diarrhea for many years.   No significant improvement in dysphagia. Has really liked Creon. Taking 2 capsules with meals, and 1 with snacks. No rectal bleeding. Still with postprandial urgency with bowel habits. Feels better overall with diarrhea but still present. At times will not eat to avoid diarrhea. No abdominal pain. Appetite is fairly good. Doesn't eat breakfast. Will grab a snack. No GERD symptoms. Bentyl not really helping.   Sometimes doesn't want to eat. Weight trending down. Xifaxan was too expensive despite covered by insurance.   Past Medical History:  Diagnosis Date  . Anxiety   . Anxiety and depression   . ASCVD (arteriosclerotic cardiovascular disease)    a. s/p PTCA to LCx in 1993 b. BMS to RCA in 01/2012 with residual 80% OM2 stenosis and medical management recommended c. 08/2019: cath showing patent stents with occluded OM2 --> medical management.   . Asthma   . CAD (coronary artery disease)   . Carcinoma in situ of colon 2004   rectal polyp  . Colitis, ischemic ([place]) 2011  . COPD (chronic obstructive pulmonary disease) (HCC)    mild ;excercise induced hypoxemia by cp stress test ;asthma ,bronchitis,  . Depression   . Diverticulosis   . Gastritis 12/30/10   EGD Dr [name]  . GERD (gastroesophageal reflux disease)   .  GI bleed   . H. pylori infection 2004   treated  . Hemorrhoids   . Hiatal hernia   . Hyperlipidemia   . Hypertension   . Inflammatory polyps of colon with rectal bleeding ([place])   . Leukocytosis    [name]  . Lung nodule 07/16/2011  . Myocardial infarction [facility])    age 71  . Restless leg syndrome   . Schatzki's ring   . Sleep apnea    does not use CPAP:cannot tolerate, PCP aware  . Syncope   . Tobacco abuse    50 pack years continuing at one halp pack daily  . Tubular adenoma of colon 06/2009   Colonosocpy Dr [name]    Past Surgical History:  Procedure Laterality Date  . BIOPSY  12/19/2017   Procedure: BIOPSY;  Surgeon: [name], MD;  [facility];  Service: Endoscopy;;  gastric  . CARDIAC CATHETERIZATION    . CHOLECYSTECTOMY  2004  . COLONOSCOPY  06/2009   normal terminal ileum, segmental mild inflammation of sigmoid colon (bx unremarkable), polyp, tubular adenoma  . COLONOSCOPY N/A 07/31/2013   Dr.Rourk- redundant anal canal hemorrhoids, colonic diverticulosis, tubular adenoma  . COLONOSCOPY W/ POLYPECTOMY  2004   rectal polyp with carcinoma in situ removed via colonoscopy  . COLONOSCOPY WITH PROPOFOL N/A 09/15/2015   Dr. [name]: Scattered diverticula throughout the colon, 2 sessile polyps found in the descending colon and cecum, 5 mm in size.  Cecal polyp was sessile serrated polyp, descending  colon polyp was a tubular adenoma.  He had a abnormal perianal exam along with grade 3 hemorrhoids.  Surveillance exam recommended for 5-year follow-up.  . COLONOSCOPY WITH PROPOFOL N/A 12/19/2017   one 5 mm polyp in ascending colon and 1 cm sessile polyp in ascending s/p removal. Tubular adenoma. internal hemorrhoids  . CORONARY ANGIOPLASTY WITH STENT PLACEMENT  01/26/2012   "1; total is now 2"  . ESOPHAGOGASTRODUODENOSCOPY  02/2009   query Barrett's but bx negative  . ESOPHAGOGASTRODUODENOSCOPY  12/30/2010   [name],gastritis, dilated 60F, sm HH, 1 small  ulcer, Duodenal erosions, benign bx  . ESOPHAGOGASTRODUODENOSCOPY (EGD) WITH ESOPHAGEAL DILATION N/A 09/12/2012   PYK:[id number] Schatzki's ring s/p Maloney dilator. Small hiatal hernia. negative path  . ESOPHAGOGASTRODUODENOSCOPY (EGD) WITH ESOPHAGEAL DILATION N/A 07/31/2013   Dr. [name]- normal egd, s/p [facility] dilation empirically. Normal small bowel biopsies   . ESOPHAGOGASTRODUODENOSCOPY (EGD) WITH PROPOFOL N/A 09/15/2015   Dr. [name]: Medium sized hiatal hernia, normal-appearing esophagus status post empiric dilation  . ESOPHAGOGASTRODUODENOSCOPY (EGD) WITH PROPOFOL N/A 12/19/2017   erosive esophagitis s/p dilation, erosive gastropathy, normal duodenum  . ESOPHAGOGASTRODUODENOSCOPY (EGD) WITH PROPOFOL N/A 09/17/2019   Non-obstructing Schatzki's ring s/p dilation, medium-sized hiatal hernia, otherwise normal.  . FLEXIBLE SIGMOIDOSCOPY  12/30/2010    [name],; internal hemorrhoids, anal papilla  . HAND SURGERY     surgical intervention for injury of the fingers of the left hand many years ago  . heart stent    . HEMORRHOID BANDING     Dr. [name]  . LEFT HEART CATH AND CORONARY ANGIOGRAPHY N/A 08/29/2019   Procedure: LEFT HEART CATH AND CORONARY ANGIOGRAPHY;  Surgeon: [name], MD;  Location: [place] [facility];  Service: Cardiovascular;  Laterality: N/A;  . LEFT HEART CATHETERIZATION WITH CORONARY ANGIOGRAM N/A 01/26/2012   Procedure: LEFT HEART CATHETERIZATION WITH CORONARY ANGIOGRAM;  Surgeon: [name], MD;  Location: [facility] CATH LAB;  Service: Cardiovascular;  Laterality: N/A;  . [name] DILATION N/A 09/15/2015   Procedure: [name] DILATION;  Surgeon: [name], MD;  [facility];  Service: Endoscopy;  Laterality: N/A;  . [name] DILATION N/A 12/19/2017   Procedure: [name] DILATION;  Surgeon: [name], MD;  [facility];  Service: Endoscopy;  Laterality: N/A;  . [name] DILATION N/A 09/17/2019   Procedure: [name] DILATION;  Surgeon: [name],  MD;  [facility];  Service: Endoscopy;  Laterality: N/A;  . NASAL SEPTOPLASTY W/ TURBINOPLASTY  10/06/2011   Procedure: NASAL SEPTOPLASTY WITH TURBINATE REDUCTION;  Surgeon: Izora Gala, MD;  Location: [place];  Service: ENT;  Laterality: Bilateral;  . PERCUTANEOUS CORONARY STENT INTERVENTION (PCI-S) N/A 01/26/2012   Procedure: PERCUTANEOUS CORONARY STENT INTERVENTION (PCI-S);  Surgeon: [name], MD;  Location: [facility] CATH LAB;  Service: Cardiovascular;  Laterality: N/A;  . POLYPECTOMY  09/15/2015   Procedure: POLYPECTOMY;  Surgeon: [name], MD;  [facility];  Service: Endoscopy;;  Cecal polyp removed via cold snare/ Descending colon polyp removed via cold snare  . POLYPECTOMY  12/19/2017   Procedure: POLYPECTOMY;  Surgeon: [name], MD;  [facility];  Service: Endoscopy;;  colon    Current Outpatient Medications  Medication Sig Dispense Refill  . albuterol (PROVENTIL) (2.5 MG/3ML) 0.083% nebulizer solution Take 3 mLs (2.5 mg total) by nebulization every 6 (six) hours as needed for wheezing or shortness of breath. 75 mL 1  . albuterol (VENTOLIN HFA) 108 (90 Base) MCG/ACT inhaler  Inhale 2 puffs into the lungs every 4 (four) hours as needed for wheezing or shortness of breath. 18 g 1  . ALPRAZolam (XANAX) 1 MG tablet Take 1 tablet (1 mg total) by mouth 3 (three) times daily as needed for anxiety. 90 tablet 2  . aspirin EC 81 MG tablet Take 81 mg by mouth daily.    Marland Kitchen atorvastatin (LIPITOR) 80 MG tablet TAKE 1 TABLET BY MOUTH EVERY DAY (Patient taking differently: Take 80 mg by mouth daily. ) 90 tablet 3  . budesonide-formoterol (SYMBICORT) 80-4.5 MCG/ACT inhaler Take 2 puffs first thing in am and then another 2 puffs about 12 hours later. 1 Inhaler 12  . CREON 36000-114000 units CPEP capsule TAKE 2 CAPSULES BY MOUTH THREE TIMES DAILY WITH MEALS. 1 CAPSULE WITH SNACKS 240 capsule 3  . dicyclomine (BENTYL) 10 MG capsule TAKE 1 CAPSULE (10 MG TOTAL) BY MOUTH  4 (FOUR) TIMES DAILY - BEFORE MEALS AND AT BEDTIME. 360 capsule 1  . FLUoxetine (PROZAC) 20 MG capsule Take 1 capsule (20 mg total) by mouth daily. 90 capsule 2  . FLUoxetine (PROZAC) 40 MG capsule Take 1 capsule (40 mg total) by mouth daily. Take with 20 mg to equal 60 mg daily 90 capsule 2  . fluticasone (FLONASE) 50 MCG/ACT nasal spray Place 2 sprays into both nostrils daily. (Patient taking differently: Place 2 sprays into both nostrils daily as needed for allergies. ) 16 g 5  . gabapentin (NEURONTIN) 300 MG capsule Take 2 capsules (600 mg total) by mouth 2 (two) times daily. 480 capsule 2  . HYDROcodone-acetaminophen (NORCO) 7.5-325 MG tablet Take 1 tablet by mouth 2 (two) times daily as needed for moderate pain. 60 tablet 0  . hydrocortisone (ANUSOL-HC) 2.5 % rectal cream Place 1 application rectally 2 (two) times daily. 30 g 1  . isosorbide mononitrate (IMDUR) 30 MG 24 hr tablet Take 1 tablet (30 mg total) by mouth daily. 90 tablet 3  . meloxicam (MOBIC) 15 MG tablet TAKE 1 TABLET BY MOUTH EVERY DAY 30 tablet 5  . metoprolol succinate (TOPROL-XL) 50 MG 24 hr tablet TAKE 1 TABLET BY MOUTH EVERY DAY 90 tablet 1  . naloxone (NARCAN) nasal spray 4 mg/0.1 mL Use as directed 1 each 0  . nitroGLYCERIN (NITROSTAT) 0.4 MG SL tablet Place 0.4 mg under the tongue every 5 (five) minutes as needed for chest pain.    Marland Kitchen OLANZapine (ZYPREXA) 10 MG tablet Take 1 tablet (10 mg total) by mouth at bedtime. 90 tablet 2  . pantoprazole (PROTONIX) 40 MG tablet Take 1 tablet (40 mg total) by mouth 2 (two) times daily before a meal. 180 tablet 3  . rOPINIRole (REQUIP) 2 MG tablet TAKE 1 TABLET BY MOUTH DAILY AT BEDTIME (Patient taking differently: Take 2 mg by mouth at bedtime. ) 90 tablet 2  . telmisartan (MICARDIS) 40 MG tablet Take 1 tablet (40 mg total) by mouth daily. 30 tablet 11  . HYDROcodone-acetaminophen (NORCO) 7.5-325 MG tablet Take one tablet by mouth 2 (two) times daily as needed for moderate pain 60  tablet 0  . HYDROcodone-acetaminophen (NORCO) 7.5-325 MG tablet Take one tablet by mouth 2 ( two ) times daily as needed for moderate pain 60 tablet 0   No current facility-administered medications for this visit.    Allergies as of 03/21/2020  . (No Known Allergies)    Family History  Problem Relation Age of Onset  . Lung disease Father  deceased, black lung  . Heart disease Mother        blood clots  . Depression Mother   . Cancer Paternal Uncle        unknown type  . Cancer Maternal Aunt        unknown type  . Kidney failure Maternal Uncle   . Hypertension Brother   . Colon cancer Neg Hx   . ADD / ADHD Neg Hx   . Alcohol abuse Neg Hx   . Drug abuse Neg Hx   . Anxiety disorder Neg Hx   . Bipolar disorder Neg Hx   . Dementia Neg Hx   . OCD Neg Hx   . Paranoid behavior Neg Hx   . Schizophrenia Neg Hx   . Physical abuse Neg Hx   . Sexual abuse Neg Hx   . Seizures Neg Hx     Social History   Socioeconomic History  . Marital status: Married    Spouse name: Not on file  . Number of children: 3  . Years of education: Not on file  . Highest education level: Not on file  Occupational History  . Occupation: disable    Employer: RETIRED    Comment: [employer]  Tobacco Use  . Smoking status: Current Every Day Smoker    Packs/day: 1.00    Years: 48.00    Pack years: 48.00    Types: Cigarettes    Start date: 07/31/1969  . Smokeless tobacco: Former Network engineer  . Vaping Use: Never used  Substance and Sexual Activity  . Alcohol use: Yes    Comment: Drinks a beer occasionally  . Drug use: No  . Sexual activity: Not Currently  Other Topics Concern  . Not on file  Social History Narrative   3 stepchildren   Social Determinants of Health   Financial Resource Strain:   . Difficulty of Paying Living Expenses: Not on file  Food Insecurity:   . Worried About Charity fundraiser in the Last Year: Not on file  . Ran Out of Food in the Last Year: Not on file   Transportation Needs:   . Lack of Transportation (Medical): Not on file  . Lack of Transportation (Non-Medical): Not on file  Physical Activity:   . Days of Exercise per Week: Not on file  . Minutes of Exercise per Session: Not on file  Stress:   . Feeling of Stress : Not on file  Social Connections:   . Frequency of Communication with Friends and Family: Not on file  . Frequency of Social Gatherings with Friends and Family: Not on file  . Attends Religious Services: Not on file  . Active Member of Clubs or Organizations: Not on file  . Attends Archivist Meetings: Not on file  . Marital Status: Not on file    Review of Systems: Gen: Denies fever, chills, anorexia. Denies fatigue, weakness, weight loss.  CV: Denies chest pain, palpitations, syncope, peripheral edema, and claudication. Resp: Denies dyspnea at rest, cough, wheezing, coughing up blood, and pleurisy. GI: see HPI Derm: Denies rash, itching, dry skin Psych: Denies depression, anxiety, memory loss, confusion. No homicidal or suicidal ideation.  Heme: Denies bruising, bleeding, and enlarged lymph nodes.  Physical Exam: BP 106/64   Pulse 89   Temp (!) 97.5 F (36.4 C) (Temporal)   Ht 5\' 10"  (1.778 m)   Wt 166 lb (75.3 kg)   BMI 23.82 kg/m  General:   Alert and  oriented. No distress noted. Pleasant and cooperative.  Head:  Normocephalic and atraumatic. Eyes:  Conjuctiva clear without scleral icterus. Mouth:  Mask in place Abdomen:  +BS, soft, non-tender and non-distended. No rebound or guarding. No HSM or masses noted. Msk:  Symmetrical without gross deformities. Normal posture. Extremities:  Without edema. Neurologic:  Alert and  oriented x4 Psych:  Alert and cooperative. Normal mood and affect.  ASSESSMENT: ROCK SOBOL is a 64 y.o. male presenting today in follow-up from EGD due to dysphagia; he underwent dilation with non-obstructing Schatzki ring and medium-sized hiatal hernia. Continues to  report dysphagia and no improvement with dilation.  Chronic diarrhea has remained an issue and has previously been evaluated with negative celiac serologies, colonoscopy unrevealing. He has responded well to Creon historically but still not ideally managed. I do note that he has had weight loss, and he attributes this to skipping meals. Need to closely monitor this and would pursue CT abd/pelvis if continues. For now, increase Creon to 3 capsules with meals and keep 1 with snacks. Bentyl has not been helpful, and Xifaxan was too expensive even with insurance coverage.    PLAN:  BPE   Increase Creon to 3 capsules with meals and keep one with snacks. Samples provided. Call with update  Close follow-up in 2 months  Imaging if further weight loss  Annitta Needs, PhD, [facility]

## 2020-03-21 NOTE — Progress Notes (Signed)
Cc'ed to pcp °

## 2020-03-21 NOTE — Telephone Encounter (Signed)
BPE scheduled for 10/14 at 10:00am, arrival 9:45am, npo 3 hrs prior.  Called pt, LMOVM at both #'s.

## 2020-03-21 NOTE — Patient Instructions (Signed)
Let's bump Creon up to 3 capsules with meals and keep 1 with snacks. Please let me know how this works for you.   Please call if persistent weight loss.  I am ordering a swallow study to further evaluate your esophagus.  We will see you in 2 months regardless!  I enjoyed seeing you again today! As you know, I value our relationship and want to provide genuine, compassionate, and quality care. I welcome your feedback. If you receive a survey regarding your visit,  I greatly appreciate you taking time to fill this out. See you next time!  Annitta Needs, PhD, ANP-BC [facility]

## 2020-03-26 ENCOUNTER — Other Ambulatory Visit: Payer: Self-pay | Admitting: Family Medicine

## 2020-03-26 NOTE — Telephone Encounter (Signed)
Left message to return call 

## 2020-03-26 NOTE — Telephone Encounter (Signed)
I am fine with refilling both of these for 6 months as long as he is still on these thank you

## 2020-03-27 ENCOUNTER — Ambulatory Visit (HOSPITAL_COMMUNITY): Payer: Medicare PPO

## 2020-04-02 ENCOUNTER — Other Ambulatory Visit: Payer: Self-pay | Admitting: Family Medicine

## 2020-04-02 NOTE — Telephone Encounter (Signed)
I am uncertain if this patient is truly taking this medication for if it was stopped.  Please try to clarify with patient.  If patient is uncertain tell the patient to bring all of his medicines with him here regardless.  In other words please bring medications with him to the visit

## 2020-04-13 ENCOUNTER — Other Ambulatory Visit: Payer: Self-pay | Admitting: Family Medicine

## 2020-04-17 ENCOUNTER — Ambulatory Visit: Payer: Medicare PPO | Admitting: Family Medicine

## 2020-04-17 ENCOUNTER — Encounter: Payer: Self-pay | Admitting: Family Medicine

## 2020-04-17 ENCOUNTER — Other Ambulatory Visit: Payer: Self-pay

## 2020-04-17 VITALS — BP 122/86 | Temp 97.9°F | Ht 70.0 in | Wt 172.6 lb

## 2020-04-17 DIAGNOSIS — M25511 Pain in right shoulder: Secondary | ICD-10-CM | POA: Diagnosis not present

## 2020-04-17 DIAGNOSIS — Z79891 Long term (current) use of opiate analgesic: Secondary | ICD-10-CM

## 2020-04-17 DIAGNOSIS — Z23 Encounter for immunization: Secondary | ICD-10-CM

## 2020-04-17 DIAGNOSIS — I10 Essential (primary) hypertension: Secondary | ICD-10-CM | POA: Diagnosis not present

## 2020-04-17 DIAGNOSIS — G8929 Other chronic pain: Secondary | ICD-10-CM

## 2020-04-17 DIAGNOSIS — R252 Cramp and spasm: Secondary | ICD-10-CM

## 2020-04-17 DIAGNOSIS — M25512 Pain in left shoulder: Secondary | ICD-10-CM | POA: Diagnosis not present

## 2020-04-17 MED ORDER — HYDROCODONE-ACETAMINOPHEN 7.5-325 MG PO TABS: 1.0000 | ORAL_TABLET | Freq: Two times a day (BID) | ORAL | 0 refills | Status: DC | PRN

## 2020-04-17 MED ORDER — HYDROCODONE-ACETAMINOPHEN 7.5-325 MG PO TABS: ORAL_TABLET | ORAL | 0 refills | Status: DC

## 2020-04-17 NOTE — Progress Notes (Signed)
Subjective:    Patient ID: [name], male    DOB: 1955-12-08, 64 y.o.   MRN: [id number]  HPI  This patient was seen today for chronic pain  The medication list was reviewed and updated.   -Compliance with medication: yes  - Number patient states they take daily: 2  -when was the last dose patient took? today  The patient was advised the importance of maintaining medication and not using illegal substances with these.  Here for refills and follow up  The patient was educated that we can provide 3 monthly scripts for their medication, it is their responsibility to follow the instructions.  Side effects or complications from medications: none  Patient is aware that pain medications are meant to minimize the severity of the pain to allow their pain levels to improve to allow for better function. They are aware of that pain medications cannot totally remove their pain.  Due for UDT ( at least once per year) : 01/16/20  Scale of 1 to 10 ( 1 is least 10 is most) Your pain level without the medicine: 7-8 Your pain level with medication 4  Scale 1 to 10 ( 1-helps very little, 10 helps very well) How well does your pain medication reduce your pain so you can function better through out the day? 9       Review of Systems  Constitutional: Negative for activity change.  HENT: Negative for congestion and rhinorrhea.   Respiratory: Negative for cough and shortness of breath.   Cardiovascular: Negative for chest pain.  Gastrointestinal: Negative for abdominal pain, diarrhea, nausea and vomiting.  Genitourinary: Negative for dysuria and hematuria.  Neurological: Negative for weakness and headaches.  Psychiatric/Behavioral: Negative for behavioral problems and confusion.       Objective:   Physical Exam Vitals reviewed.  Cardiovascular:     Rate and Rhythm: Normal rate and regular rhythm.     Heart sounds: Normal heart sounds. No murmur heard.   Pulmonary:     Effort:  Pulmonary effort is normal.     Breath sounds: Normal breath sounds.  Lymphadenopathy:     Cervical: No cervical adenopathy.  Neurological:     Mental Status: He is alert.  Psychiatric:        Behavior: Behavior normal.           Assessment & Plan:  1. Muscle cramp Patient having intermittent muscle cramps over the past few weeks relates no change in his medication we will check a metabolic 7 and magnesium stretches were recommended - Basic metabolic panel - Magnesium  2. Encounter for long-term opiate analgesic use The patient was seen in followup for chronic pain. A review over at their current pain status was discussed. Drug registry was checked. Prescriptions were given.  Regular follow-up recommended. Discussion was held regarding the importance of compliance with medication as well as pain medication contract.  Patient was informed that medication may cause drowsiness and should not be combined  with other medications/alcohol or street drugs. If the patient feels medication is causing altered alertness then do not drive or operate dangerous equipment.  Patient does state the pain medicine allows him to function better he denies any major setbacks keeps his medication in a safe spot  3. Essential hypertension Blood pressure good control continue current measures watch diet stay away from smoking - Basic metabolic panel - Magnesium  4. Chronic pain of both shoulders Worse on the right side and left side no  impingement just pain and discomfort with rotation has limited range of motion with raising his arms above his head arthritic most likely the cause hold off on anti-inflammatories currently check x-ray - DG Shoulder Right  5. Need for vaccination Flu vaccine today - Flu Vaccine QUAD 36+ mos IM

## 2020-04-17 NOTE — Telephone Encounter (Signed)
Refill request requip for 6 months Refuse metoprolol 25 he is on metoprolol 50

## 2020-04-21 ENCOUNTER — Telehealth (HOSPITAL_COMMUNITY): Payer: Self-pay | Admitting: Psychiatry

## 2020-04-21 NOTE — Telephone Encounter (Signed)
Called to schedule f/u appt LVM

## 2020-04-23 MED ORDER — ROPINIROLE HCL 2 MG PO TABS
ORAL_TABLET | ORAL | 1 refills | Status: DC
Start: 2020-04-23 — End: 2020-10-06

## 2020-04-23 NOTE — Addendum Note (Signed)
Addended by: Dairl Ponder on: 04/23/2020 11:22 AM   Modules accepted: Orders

## 2020-05-02 ENCOUNTER — Telehealth (HOSPITAL_COMMUNITY): Payer: Self-pay

## 2020-05-02 NOTE — Telephone Encounter (Signed)
Medication refill - Telephone call with Ms. Mel Almond after she left a message pt was in need of refills of his medications. Last appt 02/07/20 and none presently sheduled so collateral agreed to make appt for as soon as possible and agreed to send request to Dr. Harrington Challenger for a refill of medications until patient could be seen. Ms. Dolata stated plan to call to schedule appointment today for first available.

## 2020-05-05 ENCOUNTER — Other Ambulatory Visit (HOSPITAL_COMMUNITY): Payer: Self-pay | Admitting: Psychiatry

## 2020-05-05 MED ORDER — FLUOXETINE HCL 40 MG PO CAPS
40.0000 mg | ORAL_CAPSULE | Freq: Every day | ORAL | 2 refills | Status: DC
Start: 2020-05-05 — End: 2020-10-07

## 2020-05-05 MED ORDER — ALPRAZOLAM 1 MG PO TABS: 1.0000 mg | ORAL_TABLET | Freq: Three times a day (TID) | ORAL | 2 refills | Status: DC | PRN

## 2020-05-05 MED ORDER — GABAPENTIN 300 MG PO CAPS
600.0000 mg | ORAL_CAPSULE | Freq: Two times a day (BID) | ORAL | 2 refills | Status: DC
Start: 2020-05-05 — End: 2020-10-07

## 2020-05-05 MED ORDER — OLANZAPINE 10 MG PO TABS: 10.0000 mg | ORAL_TABLET | Freq: Every day | ORAL | 2 refills | Status: DC

## 2020-05-05 MED ORDER — FLUOXETINE HCL 20 MG PO CAPS: 20.0000 mg | ORAL_CAPSULE | Freq: Every day | ORAL | 2 refills | Status: DC

## 2020-05-05 NOTE — Telephone Encounter (Signed)
Meds sent

## 2020-05-07 ENCOUNTER — Encounter (HOSPITAL_COMMUNITY): Payer: Self-pay | Admitting: Psychiatry

## 2020-05-07 ENCOUNTER — Telehealth (INDEPENDENT_AMBULATORY_CARE_PROVIDER_SITE_OTHER): Payer: Medicare PPO | Admitting: Psychiatry

## 2020-05-07 ENCOUNTER — Other Ambulatory Visit: Payer: Self-pay

## 2020-05-07 DIAGNOSIS — F5105 Insomnia due to other mental disorder: Secondary | ICD-10-CM

## 2020-05-07 DIAGNOSIS — F418 Other specified anxiety disorders: Secondary | ICD-10-CM

## 2020-05-07 NOTE — Progress Notes (Signed)
Virtual Visit via Telephone Note  I connected with [name] on 05/07/20 at  2:00 PM EST by telephone and verified that I am speaking with the correct person using two identifiers.  Location: Patient: home Provider: home   I discussed the limitations, risks, security and privacy concerns of performing an evaluation and management service by telephone and the availability of in person appointments. I also discussed with the patient that there may be a patient responsible charge related to this service. The patient expressed understanding and agreed to proceed.    I discussed the assessment and treatment plan with the patient. The patient was provided an opportunity to ask questions and all were answered. The patient agreed with the plan and demonstrated an understanding of the instructions.   The patient was advised to call back or seek an in-person evaluation if the symptoms worsen or if the condition fails to improve as anticipated.  I provided 15 minutes of non-face-to-face time during this encounter.   [name], MD  [facility] MD/PA/NP OP Progress Note  05/07/2020 2:10 PM [name]  MRN:  [id number]  Chief Complaint:  Chief Complaint    Depression; Anxiety; Follow-up     HPI: This patient is a 64 year old married white male lives with his wife and grandchildren in [place].  He is on disability for coronary artery disease and COPD.  He used to work for the [employer].  Patient returns for follow-up after 3 months.  He states he is generally doing okay.  The holidays are difficult for him because his daughter hung herself in the family property 4 years ago at Christmas time.  Nevertheless having grandchildren around will help.  He states that he has trouble sleeping because of significant shoulder pain and is going to be getting an MRI.  He is helping his grandson to some window installation and I would imagine this is aggravating the shoulder pain.  Overall however he states his mood  is stable he denies significant depression or anxiety or suicidal ideation.   Visit Diagnosis:    ICD-10-CM   1. Depression with anxiety  F41.8   2. 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 anxiety  F51.05    F41.8     Past Psychiatric History:none  Past Medical History:  Past Medical History:  Diagnosis Date  . Anxiety   . Anxiety and depression   . ASCVD (arteriosclerotic cardiovascular disease)    a. s/p PTCA to LCx in 1993 b. BMS to RCA in 01/2012 with residual 80% OM2 stenosis and medical management recommended c. 08/2019: cath showing patent stents with occluded OM2 --> medical management.   . Asthma   . CAD (coronary artery disease)   . Carcinoma in situ of colon 2004   rectal polyp  . Colitis, ischemic ([place]) 2011  . COPD (chronic obstructive pulmonary disease) (HCC)    mild ;excercise induced hypoxemia by cp stress test ;asthma ,bronchitis,  . Depression   . Diverticulosis   . Gastritis 12/30/10   EGD Dr [name]  . GERD (gastroesophageal reflux disease)   . GI bleed   . H. pylori infection 2004   treated  . Hemorrhoids   . Hiatal hernia   . Hyperlipidemia   . Hypertension   . Inflammatory polyps of colon with rectal bleeding ([place])   . Leukocytosis    [name]  . Lung nodule 07/16/2011  . Myocardial infarction [facility])    age 44  . Restless leg syndrome   . Schatzki's  ring   . Sleep apnea    does not use CPAP:cannot tolerate, PCP aware  . Syncope   . Tobacco abuse    50 pack years continuing at one halp pack daily  . Tubular adenoma of colon 06/2009   Colonosocpy Dr [name]    Past Surgical History:  Procedure Laterality Date  . BIOPSY  12/19/2017   Procedure: BIOPSY;  Surgeon: [name], MD;  [facility];  Service: Endoscopy;;  gastric  . CARDIAC CATHETERIZATION    . CHOLECYSTECTOMY  2004  . COLONOSCOPY  06/2009   normal terminal ileum, segmental mild inflammation of sigmoid colon (bx unremarkable), polyp, tubular adenoma  . COLONOSCOPY  N/A 07/31/2013   Dr.Rourk- redundant anal canal hemorrhoids, colonic diverticulosis, tubular adenoma  . COLONOSCOPY W/ POLYPECTOMY  2004   rectal polyp with carcinoma in situ removed via colonoscopy  . COLONOSCOPY WITH PROPOFOL N/A 09/15/2015   Dr. [name]: Scattered diverticula throughout the colon, 2 sessile polyps found in the descending colon and cecum, 5 mm in size.  Cecal polyp was sessile serrated polyp, descending colon polyp was a tubular adenoma.  He had a abnormal perianal exam along with grade 3 hemorrhoids.  Surveillance exam recommended for 5-year follow-up.  . COLONOSCOPY WITH PROPOFOL N/A 12/19/2017   one 5 mm polyp in ascending colon and 1 cm sessile polyp in ascending s/p removal. Tubular adenoma. internal hemorrhoids  . CORONARY ANGIOPLASTY WITH STENT PLACEMENT  01/26/2012   "1; total is now 2"  . ESOPHAGOGASTRODUODENOSCOPY  02/2009   query Barrett's but bx negative  . ESOPHAGOGASTRODUODENOSCOPY  12/30/2010   [name],gastritis, dilated 83F, sm HH, 1 small ulcer, Duodenal erosions, benign bx  . ESOPHAGOGASTRODUODENOSCOPY (EGD) WITH ESOPHAGEAL DILATION N/A 09/12/2012   KDX:[id number] Schatzki's ring s/p Maloney dilator. Small hiatal hernia. negative path  . ESOPHAGOGASTRODUODENOSCOPY (EGD) WITH ESOPHAGEAL DILATION N/A 07/31/2013   Dr. [name]- normal egd, s/p [facility] dilation empirically. Normal small bowel biopsies   . ESOPHAGOGASTRODUODENOSCOPY (EGD) WITH PROPOFOL N/A 09/15/2015   Dr. [name]: Medium sized hiatal hernia, normal-appearing esophagus status post empiric dilation  . ESOPHAGOGASTRODUODENOSCOPY (EGD) WITH PROPOFOL N/A 12/19/2017   erosive esophagitis s/p dilation, erosive gastropathy, normal duodenum  . ESOPHAGOGASTRODUODENOSCOPY (EGD) WITH PROPOFOL N/A 09/17/2019   Non-obstructing Schatzki's ring s/p dilation, medium-sized hiatal hernia, otherwise normal.  . FLEXIBLE SIGMOIDOSCOPY  12/30/2010    [name],; internal hemorrhoids, anal papilla  . HAND SURGERY     surgical  intervention for injury of the fingers of the left hand many years ago  . heart stent    . HEMORRHOID BANDING     Dr. [name]  . LEFT HEART CATH AND CORONARY ANGIOGRAPHY N/A 08/29/2019   Procedure: LEFT HEART CATH AND CORONARY ANGIOGRAPHY;  Surgeon: [name], MD;  Location: [place] CV LAB;  Service: Cardiovascular;  Laterality: N/A;  . LEFT HEART CATHETERIZATION WITH CORONARY ANGIOGRAM N/A 01/26/2012   Procedure: LEFT HEART CATHETERIZATION WITH CORONARY ANGIOGRAM;  Surgeon: [name], MD;  Location: [facility] CATH LAB;  Service: Cardiovascular;  Laterality: N/A;  . [name] DILATION N/A 09/15/2015   Procedure: [name] DILATION;  Surgeon: [name], MD;  [facility];  Service: Endoscopy;  Laterality: N/A;  . [name] DILATION N/A 12/19/2017   Procedure: [name] DILATION;  Surgeon: [name], MD;  [facility];  Service: Endoscopy;  Laterality: N/A;  . [name] DILATION N/A 09/17/2019   Procedure: [name] DILATION;  Surgeon: [name], MD;  [facility];  Service: Endoscopy;  Laterality: N/A;  . NASAL SEPTOPLASTY W/ TURBINOPLASTY  10/06/2011   Procedure: NASAL SEPTOPLASTY WITH TURBINATE REDUCTION;  Surgeon: Izora Gala, MD;  Location: [place];  Service: ENT;  Laterality: Bilateral;  . PERCUTANEOUS CORONARY STENT INTERVENTION (PCI-S) N/A 01/26/2012   Procedure: PERCUTANEOUS CORONARY STENT INTERVENTION (PCI-S);  Surgeon: [name], MD;  Location: [facility] CATH LAB;  Service: Cardiovascular;  Laterality: N/A;  . POLYPECTOMY  09/15/2015   Procedure: POLYPECTOMY;  Surgeon: [name], MD;  [facility];  Service: Endoscopy;;  Cecal polyp removed via cold snare/ Descending colon polyp removed via cold snare  . POLYPECTOMY  12/19/2017   Procedure: POLYPECTOMY;  Surgeon: [name], MD;  [facility];  Service: Endoscopy;;  colon    Family Psychiatric History: see below  Family History:  Family History  Problem Relation Age of  Onset  . Lung disease Father        deceased, black lung  . Heart disease Mother        blood clots  . Depression Mother   . Cancer Paternal Uncle        unknown type  . Cancer Maternal Aunt        unknown type  . Kidney failure Maternal Uncle   . Hypertension Brother   . Colon cancer Neg Hx   . ADD / ADHD Neg Hx   . Alcohol abuse Neg Hx   . Drug abuse Neg Hx   . Anxiety disorder Neg Hx   . Bipolar disorder Neg Hx   . Dementia Neg Hx   . OCD Neg Hx   . Paranoid behavior Neg Hx   . Schizophrenia Neg Hx   . Physical abuse Neg Hx   . Sexual abuse Neg Hx   . Seizures Neg Hx     Social History:  Social History   Socioeconomic History  . Marital status: Married    Spouse name: Not on file  . Number of children: 3  . Years of education: Not on file  . Highest education level: Not on file  Occupational History  . Occupation: disable    Employer: RETIRED    Comment: [employer]  Tobacco Use  . Smoking status: Current Every Day Smoker    Packs/day: 1.00    Years: 48.00    Pack years: 48.00    Types: Cigarettes    Start date: 07/31/1969  . Smokeless tobacco: Former Network engineer  . Vaping Use: Never used  Substance and Sexual Activity  . Alcohol use: Yes    Comment: Drinks a beer occasionally  . Drug use: No  . Sexual activity: Not Currently  Other Topics Concern  . Not on file  Social History Narrative   3 stepchildren   Social Determinants of Health   Financial Resource Strain:   . Difficulty of Paying Living Expenses: Not on file  Food Insecurity:   . Worried About Charity fundraiser in the Last Year: Not on file  . Ran Out of Food in the Last Year: Not on file  Transportation Needs:   . Lack of Transportation (Medical): Not on file  . Lack of Transportation (Non-Medical): Not on file  Physical Activity:   . Days of Exercise per Week: Not on file  . Minutes of Exercise per Session: Not on file  Stress:   . Feeling of Stress : Not on file  Social  Connections:   . Frequency  of Communication with Friends and Family: Not on file  . Frequency of Social Gatherings with Friends and Family: Not on file  . Attends Religious Services: Not on file  . Active Member of Clubs or Organizations: Not on file  . Attends Archivist Meetings: Not on file  . Marital Status: Not on file    Allergies: No Known Allergies  Metabolic Disorder Labs: Lab Results  Component Value Date   HGBA1C 5.3 07/24/2015   MPG 103 10/29/2013   MPG 97 04/30/2013   No results found for: PROLACTIN Lab Results  Component Value Date   CHOL 129 08/29/2019   TRIG 413 (H) 08/29/2019   HDL 31 (L) 08/29/2019   CHOLHDL 4.2 08/29/2019   VLDL UNABLE TO CALCULATE IF TRIGLYCERIDE OVER 400 mg/dL 08/29/2019   LDLCALC UNABLE TO CALCULATE IF TRIGLYCERIDE OVER 400 mg/dL 08/29/2019   LDLCALC 43 12/13/2017   Lab Results  Component Value Date   TSH 1.011 02/09/2010    Therapeutic Level Labs: No results found for: LITHIUM No results found for: VALPROATE No components found for:  CBMZ  Current Medications: Current Outpatient Medications  Medication Sig Dispense Refill  . albuterol (PROVENTIL) (2.5 MG/3ML) 0.083% nebulizer solution Take 3 mLs (2.5 mg total) by nebulization every 6 (six) hours as needed for wheezing or shortness of breath. 75 mL 1  . albuterol (VENTOLIN HFA) 108 (90 Base) MCG/ACT inhaler Inhale 2 puffs into the lungs every 4 (four) hours as needed for wheezing or shortness of breath. 18 g 1  . ALPRAZolam (XANAX) 1 MG tablet Take 1 tablet (1 mg total) by mouth 3 (three) times daily as needed for anxiety. 90 tablet 2  . aspirin EC 81 MG tablet Take 81 mg by mouth daily.    Marland Kitchen atorvastatin (LIPITOR) 80 MG tablet TAKE 1 TABLET BY MOUTH EVERY DAY (Patient taking differently: Take 80 mg by mouth daily. ) 90 tablet 3  . budesonide-formoterol (SYMBICORT) 80-4.5 MCG/ACT inhaler Take 2 puffs first thing in am and then another 2 puffs about 12 hours later. 1  Inhaler 12  . CREON 36000-114000 units CPEP capsule TAKE 2 CAPSULES BY MOUTH THREE TIMES DAILY WITH MEALS. 1 CAPSULE WITH SNACKS 240 capsule 3  . dicyclomine (BENTYL) 10 MG capsule TAKE 1 CAPSULE (10 MG TOTAL) BY MOUTH 4 (FOUR) TIMES DAILY - BEFORE MEALS AND AT BEDTIME. 360 capsule 1  . FLUoxetine (PROZAC) 20 MG capsule Take 1 capsule (20 mg total) by mouth daily. 90 capsule 2  . FLUoxetine (PROZAC) 40 MG capsule Take 1 capsule (40 mg total) by mouth daily. Take with 20 mg to equal 60 mg daily 90 capsule 2  . fluticasone (FLONASE) 50 MCG/ACT nasal spray Place 2 sprays into both nostrils daily. (Patient taking differently: Place 2 sprays into both nostrils daily as needed for allergies. ) 16 g 5  . gabapentin (NEURONTIN) 300 MG capsule Take 2 capsules (600 mg total) by mouth 2 (two) times daily. 480 capsule 2  . HYDROcodone-acetaminophen (NORCO) 7.5-325 MG tablet Take 1 tablet by mouth 2 (two) times daily as needed for moderate pain. 60 tablet 0  . HYDROcodone-acetaminophen (NORCO) 7.5-325 MG tablet Take one tablet by mouth 2 (two) times daily as needed for moderate pain 60 tablet 0  . HYDROcodone-acetaminophen (NORCO) 7.5-325 MG tablet Take one tablet by mouth 2 ( two ) times daily as needed for moderate pain 60 tablet 0  . hydrocortisone (ANUSOL-HC) 2.5 % rectal cream Place 1 application rectally 2 (two)  times daily. 30 g 1  . isosorbide mononitrate (IMDUR) 30 MG 24 hr tablet Take 1 tablet (30 mg total) by mouth daily. 90 tablet 3  . meloxicam (MOBIC) 15 MG tablet TAKE 1 TABLET BY MOUTH EVERY DAY 30 tablet 5  . metoprolol succinate (TOPROL-XL) 50 MG 24 hr tablet TAKE 1 TABLET BY MOUTH EVERY DAY 90 tablet 1  . naloxone (NARCAN) nasal spray 4 mg/0.1 mL Use as directed 1 each 0  . nitroGLYCERIN (NITROSTAT) 0.4 MG SL tablet Place 0.4 mg under the tongue every 5 (five) minutes as needed for chest pain.    Marland Kitchen OLANZapine (ZYPREXA) 10 MG tablet Take 1 tablet (10 mg total) by mouth at bedtime. 90 tablet 2  .  pantoprazole (PROTONIX) 40 MG tablet Take 1 tablet (40 mg total) by mouth 2 (two) times daily before a meal. 180 tablet 3  . rOPINIRole (REQUIP) 2 MG tablet TAKE 1 TABLET BY MOUTH EVERYDAY AT BEDTIME 90 tablet 1  . telmisartan (MICARDIS) 40 MG tablet Take 1 tablet (40 mg total) by mouth daily. 30 tablet 11   No current facility-administered medications for this visit.     Musculoskeletal: Strength & Muscle Tone: within normal limits Gait & Station: normal Patient leans: N/A  Psychiatric Specialty Exam: Review of Systems  Musculoskeletal: Positive for arthralgias.  Psychiatric/Behavioral: Positive for sleep disturbance.  All other systems reviewed and are negative.   There were no vitals taken for this visit.There is no height or weight on file to calculate BMI.  General Appearance: NA  Eye Contact:  NA  Speech:  Clear and Coherent  Volume:  Normal  Mood:  Euthymic  Affect:  NA  Thought Process:  Goal Directed  Orientation:  Full (Time, Place, and Person)  Thought Content: Rumination   Suicidal Thoughts:  No  Homicidal Thoughts:  No  Memory:  Immediate;   Good Recent;   Fair Remote;   NA  Judgement:  Good  Insight:  Fair  Psychomotor Activity:  Decreased  Concentration:  Concentration: Good and Attention Span: Good  Recall:  Good  Fund of Knowledge: Fair  Language: Good  Akathisia:  No  Handed:  Right  AIMS (if indicated): not done  Assets:  Communication Skills Desire for Improvement Resilience Social Support Talents/Skills  ADL's:  Intact  Cognition: WNL  Sleep:  Poor   Screenings: GAD-7     Office Visit from 01/16/2020 in [place] Office Visit from 03/30/2018 in [place]  Total GAD-7 Score 13 4    PHQ2-9     Office Visit from 01/16/2020 in [place] Office Visit from 06/21/2019 in [place] Office Visit from 03/30/2018 in [place] Office Visit from 05/19/2017 in [place] Office Visit from 10/21/2015 in [place]  PHQ-2 Total Score 5 3 2  0 0  PHQ-9 Total Score 20 11 12  -- --       Assessment and Plan: This patient is a 64 year old male with a history depression anxiety and posttraumatic stress disorder relating to his daughter's suicide.  Last time we cut down some of his sedating medicines and he is still doing fairly well.  He will continue with Xanax 1 mg 3 times daily for anxiety, gabapentin 600 mg twice daily for anxiety, Prozac 60 mg daily for depression and olanzapine 10 mg at bedtime for mood stabilization.  He will return to see me in 3 months   [name], MD 05/07/2020, 2:10 PM

## 2020-05-18 ENCOUNTER — Other Ambulatory Visit: Payer: Self-pay | Admitting: Gastroenterology

## 2020-05-19 ENCOUNTER — Other Ambulatory Visit: Payer: Self-pay | Admitting: Student

## 2020-05-21 NOTE — Telephone Encounter (Signed)
[name], can you see if patient needs refill on Bentyl? Looks like at his last OV, Bentyl wasn't helping. Not sure if he is still taking this medication.

## 2020-05-22 DIAGNOSIS — I10 Essential (primary) hypertension: Secondary | ICD-10-CM | POA: Diagnosis not present

## 2020-05-22 DIAGNOSIS — R252 Cramp and spasm: Secondary | ICD-10-CM | POA: Diagnosis not present

## 2020-05-22 NOTE — Telephone Encounter (Signed)
Spoke with pt. Pt doesn't need rx refilled.

## 2020-05-22 NOTE — Telephone Encounter (Signed)
Noted  

## 2020-05-23 ENCOUNTER — Encounter: Payer: Self-pay | Admitting: Family Medicine

## 2020-05-23 LAB — BASIC METABOLIC PANEL
BUN/Creatinine Ratio: 18 (ref 10–24)
BUN: 16 mg/dL (ref 8–27)
CO2: 23 mmol/L (ref 20–29)
Calcium: 9.2 mg/dL (ref 8.6–10.2)
Chloride: 102 mmol/L (ref 96–106)
Creatinine, Ser: 0.87 mg/dL (ref 0.76–1.27)
GFR calc Af Amer: 106 mL/min/{1.73_m2} (ref >59)
GFR calc non Af Amer: 92 mL/min/{1.73_m2} (ref >59)
Glucose: 92 mg/dL (ref 65–99)
Potassium: 3.9 mmol/L (ref 3.5–5.2)
Sodium: 141 mmol/L (ref 134–144)

## 2020-05-23 LAB — MAGNESIUM: Magnesium: 2.1 mg/dL (ref 1.6–2.3)

## 2020-06-13 ENCOUNTER — Other Ambulatory Visit: Payer: Self-pay | Admitting: Cardiology

## 2020-06-20 ENCOUNTER — Other Ambulatory Visit: Payer: Self-pay | Admitting: Gastroenterology

## 2020-07-03 ENCOUNTER — Other Ambulatory Visit: Payer: Self-pay | Admitting: Nurse Practitioner

## 2020-07-14 ENCOUNTER — Telehealth (HOSPITAL_COMMUNITY): Payer: Self-pay | Admitting: Psychiatry

## 2020-07-14 NOTE — Telephone Encounter (Signed)
Called to schedule f/u appt, no voicemail available for pt's phone number and no voicemail box set up for emergency contacts number either

## 2020-07-18 ENCOUNTER — Ambulatory Visit: Payer: Medicare PPO | Admitting: Family Medicine

## 2020-08-12 ENCOUNTER — Other Ambulatory Visit (HOSPITAL_COMMUNITY): Payer: Self-pay | Admitting: Psychiatry

## 2020-08-21 ENCOUNTER — Other Ambulatory Visit: Payer: Self-pay | Admitting: Family Medicine

## 2020-09-03 ENCOUNTER — Other Ambulatory Visit: Payer: Self-pay | Admitting: Family Medicine

## 2020-09-03 ENCOUNTER — Telehealth: Payer: Self-pay

## 2020-09-03 MED ORDER — HYDROCODONE-ACETAMINOPHEN 7.5-325 MG PO TABS: ORAL_TABLET | ORAL | 0 refills | Status: DC

## 2020-09-03 NOTE — Telephone Encounter (Signed)
Pt needs HYDROcodone-acetaminophen (NORCO) 7.5-325 MG tablet [[id number]] [id number] [place], [place]  Pt call back [phone]

## 2020-09-03 NOTE — Telephone Encounter (Signed)
Pain medication was sent in as requested Needs to go ahead and schedule office visit now within the next 4 weeks to see me

## 2020-09-03 NOTE — Telephone Encounter (Signed)
Patient notified and scheduled pain management follow up

## 2020-10-06 ENCOUNTER — Encounter: Payer: Self-pay | Admitting: Family Medicine

## 2020-10-06 ENCOUNTER — Ambulatory Visit: Payer: Medicare PPO | Admitting: Family Medicine

## 2020-10-06 VITALS — BP 120/82 | Temp 98.0°F | Wt 170.0 lb

## 2020-10-06 DIAGNOSIS — I25119 Atherosclerotic heart disease of native coronary artery with unspecified angina pectoris: Secondary | ICD-10-CM

## 2020-10-06 DIAGNOSIS — J449 Chronic obstructive pulmonary disease, unspecified: Secondary | ICD-10-CM | POA: Diagnosis not present

## 2020-10-06 DIAGNOSIS — G8929 Other chronic pain: Secondary | ICD-10-CM

## 2020-10-06 DIAGNOSIS — M25512 Pain in left shoulder: Secondary | ICD-10-CM

## 2020-10-06 DIAGNOSIS — K625 Hemorrhage of anus and rectum: Secondary | ICD-10-CM | POA: Diagnosis not present

## 2020-10-06 DIAGNOSIS — E785 Hyperlipidemia, unspecified: Secondary | ICD-10-CM | POA: Diagnosis not present

## 2020-10-06 DIAGNOSIS — I10 Essential (primary) hypertension: Secondary | ICD-10-CM | POA: Diagnosis not present

## 2020-10-06 DIAGNOSIS — G2581 Restless legs syndrome: Secondary | ICD-10-CM

## 2020-10-06 DIAGNOSIS — F324 Major depressive disorder, single episode, in partial remission: Secondary | ICD-10-CM | POA: Diagnosis not present

## 2020-10-06 DIAGNOSIS — M25511 Pain in right shoulder: Secondary | ICD-10-CM

## 2020-10-06 MED ORDER — HYDROCODONE-ACETAMINOPHEN 7.5-325 MG PO TABS: ORAL_TABLET | ORAL | 0 refills | Status: DC

## 2020-10-06 MED ORDER — ROPINIROLE HCL 2 MG PO TABS: ORAL_TABLET | ORAL | 1 refills | Status: DC

## 2020-10-06 MED ORDER — NITROGLYCERIN 0.4 MG SL SUBL
0.4000 mg | SUBLINGUAL_TABLET | SUBLINGUAL | 5 refills | Status: AC | PRN
Start: 2020-10-06 — End: ?

## 2020-10-06 MED ORDER — ISOSORBIDE MONONITRATE ER 30 MG PO TB24
30.0000 mg | ORAL_TABLET | Freq: Every day | ORAL | 3 refills | Status: DC
Start: 2020-10-06 — End: 2021-03-19

## 2020-10-06 NOTE — Patient Instructions (Signed)
Stop meloxicam for now until you see your intestinal doctor

## 2020-10-06 NOTE — Progress Notes (Addendum)
Subjective:    Patient ID: [name], male    DOB: 09-29-1955, 65 y.o.   MRN: [id number]  HPI This patient was seen today for chronic pain  The medication list was reviewed and updated.  Location of Pain for which the patient has been treated with regarding narcotics: leg pain   Onset of this pain:    -Compliance with medication: taking as directed   - Number patient states they take daily: 2  -when was the last dose patient took? This morning   The patient was advised the importance of maintaining medication and not using illegal substances with these.  Here for refills and follow up  The patient was educated that we can provide 3 monthly scripts for their medication, it is their responsibility to follow the instructions.  Side effects or complications from medications: none  Patient is aware that pain medications are meant to minimize the severity of the pain to allow their pain levels to improve to allow for better function. They are aware of that pain medications cannot totally remove their pain.  Due for UDT ( at least once per year) : done August 2021  Scale of 1 to 10 ( 1 is least 10 is most) Your pain level without the medicine: 10 Your pain level with medication: 4  Scale 1 to 10 ( 1-helps very little, 10 helps very well) How well does your pain medication reduce your pain so you can function better through out the day? 10  Quality of the pain: *Burning pain discomfort aching  Persistence of the pain: Patient has pain all the time lower back and neck  Modifying factors: Medication does take the edge off, sometimes certain positions feel better  Essential hypertension - Plan: CBC with Differential, Ferritin, Comprehensive Metabolic Panel (CMET), Lipid Profile  Hyperlipidemia, unspecified hyperlipidemia type - Plan: CBC with Differential, Ferritin, Comprehensive Metabolic Panel (CMET), Lipid Profile  RLS (restless legs syndrome) - Plan: CBC with  Differential, Ferritin, Comprehensive Metabolic Panel (CMET), Lipid Profile, Ambulatory referral to Orthopedic Surgery  Coronary artery disease involving native coronary artery of native heart with angina pectoris (HCC) - Plan: CBC with Differential, Ferritin, Comprehensive Metabolic Panel (CMET), Lipid Profile, CT CHEST LUNG CA SCREEN LOW DOSE W/O CM  Depression, major, single episode, in partial remission (HCC)  COPD mixed type (HCC), Chronic - Plan: CBC with Differential, Ferritin, Comprehensive Metabolic Panel (CMET), Lipid Profile, CT CHEST LUNG CA SCREEN LOW DOSE W/O CM, Ambulatory referral to Cardiology  Rectal bleeding - Plan: Ambulatory referral to Gastroenterology   Patient has underlying heart disease still smokes he knows he needs to quit but has not done so he does states that cardiology medications helps with angina I am keeping it under control  Does get short of breath with activity has history of COPD uses inhalers and is stable currently  Patient has been counseled to quit smoking he would be a good candidate for lung cancer screening this was discussed with him referral will be put in system  Patient followed by Dr. Harrington Challenger on a regular basis I have encouraged the patient to try to cut back on Xanax he is currently being prescribed 3 a day by Dr. Harrington Challenger he is not to do any driving if he feels drowsy      Review of Systems  Constitutional: Negative for activity change.  HENT: Negative for congestion and rhinorrhea.   Respiratory: Negative for cough and shortness of breath.   Cardiovascular: Negative for chest  pain.  Gastrointestinal: Negative for abdominal pain, diarrhea, nausea and vomiting.  Genitourinary: Negative for dysuria and hematuria.  Neurological: Negative for weakness and headaches.  Psychiatric/Behavioral: Negative for behavioral problems and confusion.       Objective:   Physical Exam Vitals reviewed.  Cardiovascular:     Rate and Rhythm: Normal rate  and regular rhythm.     Heart sounds: Normal heart sounds. No murmur heard.   Pulmonary:     Effort: Pulmonary effort is normal.     Breath sounds: Normal breath sounds.  Lymphadenopathy:     Cervical: No cervical adenopathy.  Neurological:     Mental Status: He is alert.  Psychiatric:        Behavior: Behavior normal.    Patient also relates bilateral shoulder pain and discomfort states since difficult for him to get his arms above shoulder level because of the pain hurts when he is sleeping keeps him from doing as much activity as previous this is been coming on gradually over several months  On physical exam I find no evidence of rotator cuff tear does have subjective discomfort with movement fair range of motion except for full abduction More than likely tendinitis, possibility of early arthritis, range of motion exercises pamphlet given to him and demonstrated  Will refer to orthopedics for evaluation and possible injections       Assessment & Plan:  1. Essential hypertension Blood pressure good control continue current measures watch diet - CBC with Differential - Ferritin - Comprehensive Metabolic Panel (CMET) - Lipid Profile  2. Hyperlipidemia, unspecified hyperlipidemia type Cholesterol slightly elevated in the past takes his medicine check labs await results - CBC with Differential - Ferritin - Comprehensive Metabolic Panel (CMET) - Lipid Profile  3. RLS (restless legs syndrome) Takes his medication evening time it does help him - CBC with Differential - Ferritin - Comprehensive Metabolic Panel (CMET) - Lipid Profile - Ambulatory referral to Orthopedic Surgery  4. Coronary artery disease involving native coronary artery of native heart with angina pectoris ([place]) Heart disease under decent control angina under good control continue current medications recommend follow-up with cardiology - CBC with Differential - Ferritin - Comprehensive Metabolic Panel  (CMET) - Lipid Profile - CT CHEST LUNG CA SCREEN LOW DOSE W/O CM  5. Depression, major, single episode, in partial remission ([place]) Follows with Dr. Harrington Challenger stable currently  6. COPD mixed type ([place]) Uses his inhalers does not feel that his breathing is any worse there is really no benefit in going back to pulmonary right at the moment patient was encouraged to quit smoking - CBC with Differential - Ferritin - Comprehensive Metabolic Panel (CMET) - Lipid Profile - CT CHEST LUNG CA SCREEN LOW DOSE W/O CM - Ambulatory referral to Cardiology  7. Rectal bleeding Referral because patient states at times there is blood in the stools could be a hemorrhoid issue but could be something else needs close follow-up with gastroenterology stopping anti-inflammatory  Recheck 3 months - Ambulatory referral to Gastroenterology

## 2020-10-07 ENCOUNTER — Telehealth (INDEPENDENT_AMBULATORY_CARE_PROVIDER_SITE_OTHER): Payer: Medicare PPO | Admitting: Psychiatry

## 2020-10-07 ENCOUNTER — Other Ambulatory Visit: Payer: Self-pay

## 2020-10-07 ENCOUNTER — Encounter (HOSPITAL_COMMUNITY): Payer: Self-pay | Admitting: Psychiatry

## 2020-10-07 ENCOUNTER — Encounter: Payer: Self-pay | Admitting: Internal Medicine

## 2020-10-07 DIAGNOSIS — F418 Other specified anxiety disorders: Secondary | ICD-10-CM

## 2020-10-07 DIAGNOSIS — F5105 Insomnia due to other mental disorder: Secondary | ICD-10-CM | POA: Diagnosis not present

## 2020-10-07 MED ORDER — TRAZODONE HCL 50 MG PO TABS: 50.0000 mg | ORAL_TABLET | Freq: Every day | ORAL | 2 refills | Status: DC

## 2020-10-07 MED ORDER — ALPRAZOLAM 1 MG PO TABS: 1.0000 mg | ORAL_TABLET | Freq: Three times a day (TID) | ORAL | 2 refills | Status: DC | PRN

## 2020-10-07 MED ORDER — GABAPENTIN 300 MG PO CAPS: 600.0000 mg | ORAL_CAPSULE | Freq: Two times a day (BID) | ORAL | 2 refills | Status: DC

## 2020-10-07 MED ORDER — FLUOXETINE HCL 40 MG PO CAPS: 40.0000 mg | ORAL_CAPSULE | Freq: Every day | ORAL | 2 refills | Status: DC

## 2020-10-07 MED ORDER — OLANZAPINE 10 MG PO TABS: 10.0000 mg | ORAL_TABLET | Freq: Every day | ORAL | 2 refills | Status: DC

## 2020-10-07 MED ORDER — FLUOXETINE HCL 20 MG PO CAPS: 20.0000 mg | ORAL_CAPSULE | Freq: Every day | ORAL | 2 refills | Status: DC

## 2020-10-07 NOTE — Progress Notes (Signed)
Virtual Visit via Telephone Note  I connected with [name] on 10/07/20 at 11:00 AM EDT by telephone and verified that I am speaking with the correct person using two identifiers.  Location: Patient: home Provider: office   I discussed the limitations, risks, security and privacy concerns of performing an evaluation and management service by telephone and the availability of in person appointments. I also discussed with the patient that there may be a patient responsible charge related to this service. The patient expressed understanding and agreed to proceed.     I discussed the assessment and treatment plan with the patient. The patient was provided an opportunity to ask questions and all were answered. The patient agreed with the plan and demonstrated an understanding of the instructions.   The patient was advised to call back or seek an in-person evaluation if the symptoms worsen or if the condition fails to improve as anticipated.  I provided 15 minutes of non-face-to-face time during this encounter.   [name] Spiller, MD  [facility] MD/PA/NP OP Progress Note  10/07/2020 11:20 AM [name]  MRN:  [id number]  Chief Complaint:  Chief Complaint    Depression; Anxiety; Follow-up     HPI: This patient is a 65 year old married white male lives with his wife and 1 grandchild in [place].  He is on disability for coronary artery disease and COPD.  He used to work for [employer].  The patient returns for follow-up after about 5 months.  He states he is doing okay for the most part but he is not sleeping well.  At times he gets depressed about "little things."  He still gets depressed around the anniversary of his late daughters birthday.  He is taking care of her child who is now 10 years old.  He is having a lot of trouble sleeping and so this is due to shoulder pain.  He goes to bed at but wakes up in the middle of the night and cannot get back to sleep.  He is already on a lot of sedating  medication but I offered to add a low-dose of trazodone.  However he is supposed to be getting injections in his shoulders and perhaps this will help. Visit Diagnosis:    ICD-10-CM   1. Depression with anxiety  F41.8   2. 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 Anxiety   . Anxiety and depression   . ASCVD (arteriosclerotic cardiovascular disease)    a. s/p PTCA to LCx in 1993 b. BMS to RCA in 01/2012 with residual 80% OM2 stenosis and medical management recommended c. 08/2019: cath showing patent stents with occluded OM2 --> medical management.   . Asthma   . CAD (coronary artery disease)   . Carcinoma in situ of colon 2004   rectal polyp  . Colitis, ischemic ([place]) 2011  . COPD (chronic obstructive pulmonary disease) (HCC)    mild ;excercise induced hypoxemia by cp stress test ;asthma ,bronchitis,  . Depression   . Diverticulosis   . Gastritis 12/30/10   EGD Dr [name]  . GERD (gastroesophageal reflux disease)   . GI bleed   . H. pylori infection 2004   treated  . Hemorrhoids   . Hiatal hernia   . Hyperlipidemia   . Hypertension   . Inflammatory polyps of colon with rectal bleeding ([place])   .  Leukocytosis    [name]  . Lung nodule 07/16/2011  . Myocardial infarction [facility])    age 38  . Restless leg syndrome   . Schatzki's ring   . Sleep apnea    does not use CPAP:cannot tolerate, PCP aware  . Syncope   . Tobacco abuse    50 pack years continuing at one halp pack daily  . Tubular adenoma of colon 06/2009   Colonosocpy Dr [name]    Past Surgical History:  Procedure Laterality Date  . BIOPSY  12/19/2017   Procedure: BIOPSY;  Surgeon: [name], MD;  [facility];  Service: Endoscopy;;  gastric  . CARDIAC CATHETERIZATION    . CHOLECYSTECTOMY  2004  . COLONOSCOPY  06/2009   normal terminal ileum, segmental mild inflammation of  sigmoid colon (bx unremarkable), polyp, tubular adenoma  . COLONOSCOPY N/A 07/31/2013   Dr.Rourk- redundant anal canal hemorrhoids, colonic diverticulosis, tubular adenoma  . COLONOSCOPY W/ POLYPECTOMY  2004   rectal polyp with carcinoma in situ removed via colonoscopy  . COLONOSCOPY WITH PROPOFOL N/A 09/15/2015   Dr. [name]: Scattered diverticula throughout the colon, 2 sessile polyps found in the descending colon and cecum, 5 mm in size.  Cecal polyp was sessile serrated polyp, descending colon polyp was a tubular adenoma.  He had a abnormal perianal exam along with grade 3 hemorrhoids.  Surveillance exam recommended for 5-year follow-up.  . COLONOSCOPY WITH PROPOFOL N/A 12/19/2017   one 5 mm polyp in ascending colon and 1 cm sessile polyp in ascending s/p removal. Tubular adenoma. internal hemorrhoids  . CORONARY ANGIOPLASTY WITH STENT PLACEMENT  01/26/2012   "1; total is now 2"  . ESOPHAGOGASTRODUODENOSCOPY  02/2009   query Barrett's but bx negative  . ESOPHAGOGASTRODUODENOSCOPY  12/30/2010   [name],gastritis, dilated 59F, sm HH, 1 small ulcer, Duodenal erosions, benign bx  . ESOPHAGOGASTRODUODENOSCOPY (EGD) WITH ESOPHAGEAL DILATION N/A 09/12/2012   [id number] Schatzki's ring s/p Maloney dilator. Small hiatal hernia. negative path  . ESOPHAGOGASTRODUODENOSCOPY (EGD) WITH ESOPHAGEAL DILATION N/A 07/31/2013   Dr. [name]- normal egd, s/p [facility] dilation empirically. Normal small bowel biopsies   . ESOPHAGOGASTRODUODENOSCOPY (EGD) WITH PROPOFOL N/A 09/15/2015   Dr. [name]: Medium sized hiatal hernia, normal-appearing esophagus status post empiric dilation  . ESOPHAGOGASTRODUODENOSCOPY (EGD) WITH PROPOFOL N/A 12/19/2017   erosive esophagitis s/p dilation, erosive gastropathy, normal duodenum  . ESOPHAGOGASTRODUODENOSCOPY (EGD) WITH PROPOFOL N/A 09/17/2019   Non-obstructing Schatzki's ring s/p dilation, medium-sized hiatal hernia, otherwise normal.  . FLEXIBLE SIGMOIDOSCOPY  12/30/2010    [name]  Rourk,; internal hemorrhoids, anal papilla  . HAND SURGERY     surgical intervention for injury of the fingers of the left hand many years ago  . heart stent    . HEMORRHOID BANDING     Dr. [name]  . LEFT HEART CATH AND CORONARY ANGIOGRAPHY N/A 08/29/2019   Procedure: LEFT HEART CATH AND CORONARY ANGIOGRAPHY;  Surgeon: [name], MD;  Location: [place] CV LAB;  Service: Cardiovascular;  Laterality: N/A;  . LEFT HEART CATHETERIZATION WITH CORONARY ANGIOGRAM N/A 01/26/2012   Procedure: LEFT HEART CATHETERIZATION WITH CORONARY ANGIOGRAM;  Surgeon: [name], MD;  Location: [facility] CATH LAB;  Service: Cardiovascular;  Laterality: N/A;  . [name] DILATION N/A 09/15/2015   Procedure: [name] DILATION;  Surgeon: [name], MD;  [facility];  Service: Endoscopy;  Laterality: N/A;  . [name] DILATION N/A 12/19/2017   Procedure: [name] DILATION;  Surgeon: [name], MD;  [facility];  Service: Endoscopy;  Laterality: N/A;  . [name] DILATION N/A 09/17/2019   Procedure: [name] DILATION;  Surgeon: [name], MD;  [facility];  Service: Endoscopy;  Laterality: N/A;  . NASAL SEPTOPLASTY W/ TURBINOPLASTY  10/06/2011   Procedure: NASAL SEPTOPLASTY WITH TURBINATE REDUCTION;  Surgeon: Izora Gala, MD;  Location: [place];  Service: ENT;  Laterality: Bilateral;  . PERCUTANEOUS CORONARY STENT INTERVENTION (PCI-S) N/A 01/26/2012   Procedure: PERCUTANEOUS CORONARY STENT INTERVENTION (PCI-S);  Surgeon: [name], MD;  Location: [facility] CATH LAB;  Service: Cardiovascular;  Laterality: N/A;  . POLYPECTOMY  09/15/2015   Procedure: POLYPECTOMY;  Surgeon: [name], MD;  [facility];  Service: Endoscopy;;  Cecal polyp removed via cold snare/ Descending colon polyp removed via cold snare  . POLYPECTOMY  12/19/2017   Procedure: POLYPECTOMY;  Surgeon: [name], MD;  [facility];  Service: Endoscopy;;  colon    Family Psychiatric History:  see below  Family History:  Family History  Problem Relation Age of Onset  . Lung disease Father        deceased, black lung  . Heart disease Mother        blood clots  . Depression Mother   . Cancer Paternal Uncle        unknown type  . Cancer Maternal Aunt        unknown type  . Kidney failure Maternal Uncle   . Hypertension Brother   . Colon cancer Neg Hx   . ADD / ADHD Neg Hx   . Alcohol abuse Neg Hx   . Drug abuse Neg Hx   . Anxiety disorder Neg Hx   . Bipolar disorder Neg Hx   . Dementia Neg Hx   . OCD Neg Hx   . Paranoid behavior Neg Hx   . Schizophrenia Neg Hx   . Physical abuse Neg Hx   . Sexual abuse Neg Hx   . Seizures Neg Hx     Social History:  Social History   Socioeconomic History  . Marital status: Married    Spouse name: Not on file  . Number of children: 3  . Years of education: Not on file  . Highest education level: Not on file  Occupational History  . Occupation: disable    Employer: RETIRED    Comment: [employer]  Tobacco Use  . Smoking status: Current Every Day Smoker    Packs/day: 1.00    Years: 48.00    Pack years: 48.00    Types: Cigarettes    Start date: 07/31/1969  . Smokeless tobacco: Former Network engineer  . Vaping Use: Never used  Substance and Sexual Activity  . Alcohol use: Yes    Comment: Drinks a beer occasionally  . Drug use: No  . Sexual activity: Not Currently  Other Topics Concern  . Not on file  Social History Narrative   3 stepchildren   Social Determinants of Health   Financial Resource Strain: Not on file  Food Insecurity: Not on file  Transportation Needs: Not on file  Physical Activity: Not on file  Stress: Not on file  Social Connections: Not on file    Allergies: No Known Allergies  Metabolic Disorder Labs: Lab Results  Component Value Date   HGBA1C 5.3 07/24/2015   MPG 103 10/29/2013   MPG 97 04/30/2013   No results found for: PROLACTIN Lab Results  Component Value Date  CHOL 129  08/29/2019   TRIG 413 (H) 08/29/2019   HDL 31 (L) 08/29/2019   CHOLHDL 4.2 08/29/2019   VLDL UNABLE TO CALCULATE IF TRIGLYCERIDE OVER 400 mg/dL 08/29/2019   LDLCALC UNABLE TO CALCULATE IF TRIGLYCERIDE OVER 400 mg/dL 08/29/2019   LDLCALC 43 12/13/2017   Lab Results  Component Value Date   TSH 1.011 02/09/2010    Therapeutic Level Labs: No results found for: LITHIUM No results found for: VALPROATE No components found for:  CBMZ  Current Medications: Current Outpatient Medications  Medication Sig Dispense Refill  . traZODone (DESYREL) 50 MG tablet Take 1 tablet (50 mg total) by mouth at bedtime. 30 tablet 2  . albuterol (PROVENTIL) (2.5 MG/3ML) 0.083% nebulizer solution Take 3 mLs (2.5 mg total) by nebulization every 6 (six) hours as needed for wheezing or shortness of breath. 75 mL 1  . albuterol (VENTOLIN HFA) 108 (90 Base) MCG/ACT inhaler Inhale 2 puffs into the lungs every 4 (four) hours as needed for wheezing or shortness of breath. 18 g 1  . ALPRAZolam (XANAX) 1 MG tablet Take 1 tablet (1 mg total) by mouth 3 (three) times daily as needed for anxiety. 90 tablet 2  . aspirin EC 81 MG tablet Take 81 mg by mouth daily.    Marland Kitchen atorvastatin (LIPITOR) 80 MG tablet TAKE 1 TABLET BY MOUTH EVERY DAY 90 tablet 3  . budesonide-formoterol (SYMBICORT) 80-4.5 MCG/ACT inhaler Take 2 puffs first thing in am and then another 2 puffs about 12 hours later. 1 Inhaler 12  . CREON 36000-114000 units CPEP capsule TAKE 2 CAPSULES BY MOUTH THREE TIMES DAILY WITH MEALS. 1 CAPSULE WITH SNACKS 240 capsule 3  . dicyclomine (BENTYL) 10 MG capsule TAKE 1 CAPSULE (10 MG TOTAL) BY MOUTH 4 (FOUR) TIMES DAILY - BEFORE MEALS AND AT BEDTIME. 360 capsule 1  . FLUoxetine (PROZAC) 20 MG capsule Take 1 capsule (20 mg total) by mouth daily. 90 capsule 2  . FLUoxetine (PROZAC) 40 MG capsule Take 1 capsule (40 mg total) by mouth daily. Take with 20 mg to equal 60 mg daily 90 capsule 2  . fluticasone (FLONASE) 50 MCG/ACT nasal  spray Place 2 sprays into both nostrils daily. (Patient taking differently: Place 2 sprays into both nostrils daily as needed for allergies.) 16 g 5  . gabapentin (NEURONTIN) 300 MG capsule Take 2 capsules (600 mg total) by mouth 2 (two) times daily. 480 capsule 2  . HYDROcodone-acetaminophen (NORCO) 7.5-325 MG tablet Take 1 tablet by mouth 2 (two) times daily as needed for moderate pain. 60 tablet 0  . HYDROcodone-acetaminophen (NORCO) 7.5-325 MG tablet Take one tablet by mouth 2 ( two ) times daily as needed for moderate pain 60 tablet 0  . HYDROcodone-acetaminophen (NORCO) 7.5-325 MG tablet Take one tablet by mouth 2 (two) times daily as needed for moderate pain 60 tablet 0  . HYDROcodone-acetaminophen (NORCO) 7.5-325 MG tablet Take one tablet po 2 times daily prn for moderate pain 60 tablet 0  . hydrocortisone (ANUSOL-HC) 2.5 % rectal cream Place 1 application rectally 2 (two) times daily. 30 g 1  . isosorbide mononitrate (IMDUR) 30 MG 24 hr tablet Take 1 tablet (30 mg total) by mouth daily. 90 tablet 3  . meloxicam (MOBIC) 15 MG tablet TAKE 1 TABLET BY MOUTH EVERY DAY 30 tablet 5  . metoprolol succinate (TOPROL-XL) 50 MG 24 hr tablet Take 1 tablet (50 mg total) by mouth daily. Please schedule follow up appointment. Thank you 90 tablet 1  .  naloxone (NARCAN) nasal spray 4 mg/0.1 mL Use as directed 1 each 0  . nitroGLYCERIN (NITROSTAT) 0.4 MG SL tablet Place 1 tablet (0.4 mg total) under the tongue every 5 (five) minutes as needed for chest pain. 20 tablet 5  . OLANZapine (ZYPREXA) 10 MG tablet Take 1 tablet (10 mg total) by mouth at bedtime. 90 tablet 2  . pantoprazole (PROTONIX) 40 MG tablet TAKE 1 TABLET BY MOUTH TWICE A DAY BEFORE A MEAL 180 tablet 3  . rOPINIRole (REQUIP) 2 MG tablet TAKE 1 TABLET BY MOUTH EVERYDAY AT BEDTIME 90 tablet 1  . telmisartan (MICARDIS) 40 MG tablet Take 1 tablet (40 mg total) by mouth daily. 30 tablet 11   No current facility-administered medications for this  visit.     Musculoskeletal: Strength & Muscle Tone: within normal limits Gait & Station: normal Patient leans: N/A  Psychiatric Specialty Exam: Review of Systems  Musculoskeletal: Positive for arthralgias and myalgias.  Psychiatric/Behavioral: Positive for sleep disturbance.  All other systems reviewed and are negative.   There were no vitals taken for this visit.There is no height or weight on file to calculate BMI.  General Appearance: NA  Eye Contact:  NA  Speech:  Clear and Coherent  Volume:  Normal  Mood:  Euthymic  Affect:  NA  Thought Process:  Goal Directed  Orientation:  Full (Time, Place, and Person)  Thought Content: WDL   Suicidal Thoughts:  No  Homicidal Thoughts:  No  Memory:  Immediate;   Good Recent;   Good Remote;   Fair  Judgement:  Good  Insight:  Fair  Psychomotor Activity:  Decreased  Concentration:  Concentration: Good and Attention Span: Good  Recall:  Good  Fund of Knowledge: Good  Language: Good  Akathisia:  No  Handed:  Right  AIMS (if indicated): not done  Assets:  Communication Skills Desire for Improvement Resilience Social Support Talents/Skills  ADL's:  Intact  Cognition: WNL  Sleep:  Poor   Screenings: GAD-7   Flowsheet Row Office Visit from 01/16/2020 in [place] Office Visit from 03/30/2018 in [place]  Total GAD-7 Score 13 4    PHQ2-9   Flowsheet Row Video Visit from 10/07/2020 in [place] Office Visit from 10/06/2020 in [place] Office Visit from 01/16/2020 in [place] Office Visit from 06/21/2019 in [place] Office Visit from 03/30/2018 in [facility]  PHQ-2 Total Score 1 0 5 3 2   PHQ-9 Total Score 5 2 20 11 12     Flowsheet Row Video Visit from 10/07/2020 in [place] No Risk       Assessment and Plan: This  patient is a 65 year old male with a history of depression anxiety and posttraumatic stress disorder relating to his daughter's suicide.  He is not sleeping well and some of this is due to his shoulder pain.  Nevertheless I will add trazodone 50 mg at bedtime for sleep.  He will continue Xanax 1 mg 3 times daily for anxiety, gabapentin 600 mg twice daily for anxiety, Prozac 60 mg daily for depression and olanzapine 10 mg at bedtime for mood stabilization.  Return to see me in 3 months   [name] Spiller, MD 10/07/2020, 11:21 AM

## 2020-10-16 NOTE — Progress Notes (Signed)
Shoulder pain associated with ortho referral.

## 2020-10-16 NOTE — Addendum Note (Signed)
Addended by: Vicente Males on: 10/16/2020 03:47 PM   Modules accepted: Orders

## 2020-10-21 ENCOUNTER — Ambulatory Visit: Payer: Medicare PPO

## 2020-10-21 ENCOUNTER — Other Ambulatory Visit: Payer: Self-pay

## 2020-10-21 ENCOUNTER — Ambulatory Visit: Payer: Medicare PPO | Admitting: Orthopedic Surgery

## 2020-10-21 ENCOUNTER — Encounter: Payer: Self-pay | Admitting: Orthopedic Surgery

## 2020-10-21 VITALS — BP 144/92 | HR 76 | Ht 70.0 in | Wt 173.0 lb

## 2020-10-21 DIAGNOSIS — G8929 Other chronic pain: Secondary | ICD-10-CM

## 2020-10-21 DIAGNOSIS — M25511 Pain in right shoulder: Secondary | ICD-10-CM

## 2020-10-21 DIAGNOSIS — M25512 Pain in left shoulder: Secondary | ICD-10-CM

## 2020-10-21 NOTE — Patient Instructions (Signed)

## 2020-10-21 NOTE — Progress Notes (Signed)
New Patient Visit  Assessment: [name] is a 65 y.o. male with the following: Chronic pain of both shoulders  Plan: Reviewed radiographs with the patient in clinic today which demonstrates concern for chronic rotator cuff injury bilaterally.  On physical exam, he has limited ability for overhead motion.  Passively he is able to achieve full extension, and he is able to maintain this position, but he has difficulty getting his arm above his head.  He states both shoulders hurt equally, with neither causing more discomfort.  His pain is most severe at night.  He is a high risk surgical candidate, so I am reluctant to order an MRI for further evaluation as this would not change my plan for management.  He stated his understanding.  I offered him shoulder injections, and he elected proceed with bilateral shoulder injections in clinic today.  He should continue to continue with his activities as tolerated.  Follow-up as needed.  Procedure note injection - Bilateral  Shoulders (subacromial space)   Verbal consent was obtained to inject Bilateral  Shoulders (subacromial space) Timeout was completed to confirm the site of injection.  The skin was prepped with alcohol and ethyl chloride was sprayed at the injection site.  A 21-gauge needle was used to inject 6 mg of Betamethasone and 1% lidocaine (3 cc) into Bilateral  Shoulders using a Posterolateral approach.  There were no complications. A sterile bandage was applied.   Follow-up: Return if symptoms worsen or fail to improve.  Subjective:  Chief Complaint  Patient presents with  . Shoulder Pain    Bilat shoulder pain for 8 months.    History of Present Illness: [name] is a 65 y.o. male who has been referred to clinic today by Sallee Lange, MD for evaluation of bilateral shoulder pain.  He states both shoulders hurt equally.  Occasionally, 1 shoulder will hurt more than the other.  He denies injury to either shoulder.  He states  the pain has been like this for at least the last 8 months.  The pain gets much worse at night, affecting his sleep.  He has difficulty with overhead motion.  He will occasionally take over-the-counter pain medications.  He has an extensive heart and lung history and is a poor surgical candidate overall.  He has never had an injection in his shoulders.  No physical therapy to date.  He does not work, reporting that he is on disability secondary to his health conditions.   Review of Systems: No fevers or chills No numbness or tingling No chest pain currently No shortness of breath currently No bowel or bladder dysfunction No GI distress No headaches   Medical History:  Past Medical History:  Diagnosis Date  . Anxiety   . Anxiety and depression   . ASCVD (arteriosclerotic cardiovascular disease)    a. s/p PTCA to LCx in 1993 b. BMS to RCA in 01/2012 with residual 80% OM2 stenosis and medical management recommended c. 08/2019: cath showing patent stents with occluded OM2 --> medical management.   . Asthma   . CAD (coronary artery disease)   . Carcinoma in situ of colon 2004   rectal polyp  . Colitis, ischemic ([place]) 2011  . COPD (chronic obstructive pulmonary disease) (HCC)    mild ;excercise induced hypoxemia by cp stress test ;asthma ,bronchitis,  . Depression   . Diverticulosis   . Gastritis 12/30/10   EGD Dr [name]  . GERD (gastroesophageal reflux disease)   . GI  bleed   . H. pylori infection 2004   treated  . Hemorrhoids   . Hiatal hernia   . Hyperlipidemia   . Hypertension   . Inflammatory polyps of colon with rectal bleeding ([place])   . Leukocytosis    [name]  . Lung nodule 07/16/2011  . Myocardial infarction [name])    age 33  . Restless leg syndrome   . Schatzki's ring   . Sleep apnea    does not use CPAP:cannot tolerate, PCP aware  . Syncope   . Tobacco abuse    50 pack years continuing at one halp pack daily  . Tubular adenoma of colon 06/2009    Colonosocpy Dr [name]    Past Surgical History:  Procedure Laterality Date  . BIOPSY  12/19/2017   Procedure: BIOPSY;  Surgeon: [name], MD;  [facility];  Service: Endoscopy;;  gastric  . CARDIAC CATHETERIZATION    . CHOLECYSTECTOMY  2004  . COLONOSCOPY  06/2009   normal terminal ileum, segmental mild inflammation of sigmoid colon (bx unremarkable), polyp, tubular adenoma  . COLONOSCOPY N/A 07/31/2013   Dr.Rourk- redundant anal canal hemorrhoids, colonic diverticulosis, tubular adenoma  . COLONOSCOPY W/ POLYPECTOMY  2004   rectal polyp with carcinoma in situ removed via colonoscopy  . COLONOSCOPY WITH PROPOFOL N/A 09/15/2015   Dr. [name]: Scattered diverticula throughout the colon, 2 sessile polyps found in the descending colon and cecum, 5 mm in size.  Cecal polyp was sessile serrated polyp, descending colon polyp was a tubular adenoma.  He had a abnormal perianal exam along with grade 3 hemorrhoids.  Surveillance exam recommended for 5-year follow-up.  . COLONOSCOPY WITH PROPOFOL N/A 12/19/2017   one 5 mm polyp in ascending colon and 1 cm sessile polyp in ascending s/p removal. Tubular adenoma. internal hemorrhoids  . CORONARY ANGIOPLASTY WITH STENT PLACEMENT  01/26/2012   "1; total is now 2"  . ESOPHAGOGASTRODUODENOSCOPY  02/2009   query Barrett's but bx negative  . ESOPHAGOGASTRODUODENOSCOPY  12/30/2010   [name],gastritis, dilated 72F, sm HH, 1 small ulcer, Duodenal erosions, benign bx  . ESOPHAGOGASTRODUODENOSCOPY (EGD) WITH ESOPHAGEAL DILATION N/A 09/12/2012   GBT:[id number] Schatzki's ring s/p Maloney dilator. Small hiatal hernia. negative path  . ESOPHAGOGASTRODUODENOSCOPY (EGD) WITH ESOPHAGEAL DILATION N/A 07/31/2013   Dr. [name]- normal egd, s/p [facility] dilation empirically. Normal small bowel biopsies   . ESOPHAGOGASTRODUODENOSCOPY (EGD) WITH PROPOFOL N/A 09/15/2015   Dr. [name]: Medium sized hiatal hernia, normal-appearing esophagus status post empiric dilation  .  ESOPHAGOGASTRODUODENOSCOPY (EGD) WITH PROPOFOL N/A 12/19/2017   erosive esophagitis s/p dilation, erosive gastropathy, normal duodenum  . ESOPHAGOGASTRODUODENOSCOPY (EGD) WITH PROPOFOL N/A 09/17/2019   Non-obstructing Schatzki's ring s/p dilation, medium-sized hiatal hernia, otherwise normal.  . FLEXIBLE SIGMOIDOSCOPY  12/30/2010    [name],; internal hemorrhoids, anal papilla  . HAND SURGERY     surgical intervention for injury of the fingers of the left hand many years ago  . heart stent    . HEMORRHOID BANDING     Dr. [name]  . LEFT HEART CATH AND CORONARY ANGIOGRAPHY N/A 08/29/2019   Procedure: LEFT HEART CATH AND CORONARY ANGIOGRAPHY;  Surgeon: [name], MD;  Location: [place] [facility];  Service: Cardiovascular;  Laterality: N/A;  . LEFT HEART CATHETERIZATION WITH CORONARY ANGIOGRAM N/A 01/26/2012   Procedure: LEFT HEART CATHETERIZATION WITH CORONARY ANGIOGRAM;  Surgeon: [name], MD;  Location: [facility] CATH LAB;  Service: Cardiovascular;  Laterality: N/A;  . MALONEY DILATION N/A  09/15/2015   Procedure: [name] DILATION;  Surgeon: [name], MD;  [facility];  Service: Endoscopy;  Laterality: N/A;  . [name] DILATION N/A 12/19/2017   Procedure: [name] DILATION;  Surgeon: [name], MD;  [facility];  Service: Endoscopy;  Laterality: N/A;  . [name] DILATION N/A 09/17/2019   Procedure: [name] DILATION;  Surgeon: [name], MD;  [facility];  Service: Endoscopy;  Laterality: N/A;  . NASAL SEPTOPLASTY W/ TURBINOPLASTY  10/06/2011   Procedure: NASAL SEPTOPLASTY WITH TURBINATE REDUCTION;  Surgeon: Izora Gala, MD;  Location: [place];  Service: ENT;  Laterality: Bilateral;  . PERCUTANEOUS CORONARY STENT INTERVENTION (PCI-S) N/A 01/26/2012   Procedure: PERCUTANEOUS CORONARY STENT INTERVENTION (PCI-S);  Surgeon: [name], MD;  Location: [facility] CATH LAB;  Service: Cardiovascular;  Laterality: N/A;  . POLYPECTOMY  09/15/2015   Procedure:  POLYPECTOMY;  Surgeon: [name], MD;  [facility];  Service: Endoscopy;;  Cecal polyp removed via cold snare/ Descending colon polyp removed via cold snare  . POLYPECTOMY  12/19/2017   Procedure: POLYPECTOMY;  Surgeon: [name], MD;  [facility];  Service: Endoscopy;;  colon    Family History  Problem Relation Age of Onset  . Lung disease Father        deceased, black lung  . Heart disease Mother        blood clots  . Depression Mother   . Cancer Paternal Uncle        unknown type  . Cancer Maternal Aunt        unknown type  . Kidney failure Maternal Uncle   . Hypertension Brother   . Colon cancer Neg Hx   . ADD / ADHD Neg Hx   . Alcohol abuse Neg Hx   . Drug abuse Neg Hx   . Anxiety disorder Neg Hx   . Bipolar disorder Neg Hx   . Dementia Neg Hx   . OCD Neg Hx   . Paranoid behavior Neg Hx   . Schizophrenia Neg Hx   . Physical abuse Neg Hx   . Sexual abuse Neg Hx   . Seizures Neg Hx    Social History   Tobacco Use  . Smoking status: Current Every Day Smoker    Packs/day: 1.00    Years: 48.00    Pack years: 48.00    Types: Cigarettes    Start date: 07/31/1969  . Smokeless tobacco: Former Network engineer  . Vaping Use: Never used  Substance Use Topics  . Alcohol use: Yes    Comment: Drinks a beer occasionally  . Drug use: No    No Known Allergies  No outpatient medications have been marked as taking for the 10/21/20 encounter (Office Visit) with Mordecai Rasmussen, MD.    Objective: BP (!) 144/92   Pulse 76   Ht 5\' 10"  (1.778 m)   Wt 173 lb (78.5 kg)   BMI 24.82 kg/m   Physical Exam:  General: Thin male.  No acute distress.  Alert and oriented. Gait: Normal  Evaluation of bilateral shoulders demonstrates no obvious deformity.  General atrophy around bilateral shoulders.  Forward flexion to 100 degrees on the right side, 90 degrees on the left side.  Passive forward flexion to 160 degrees with some discomfort.  Internal  rotation to his side bilaterally.  Negative belly press bilaterally.  4/5 strength on infraspinatus testing.  Negative external rotation lag.  [place]  degrees of external rotation at his side.  Abduction to approximately 70 degrees bilaterally.  Sensation is intact distally.  Fingers are warm and well-perfused.    IMAGING: I personally ordered and reviewed the following images   X-ray of the right shoulder demonstrates no acute injury.  The glenohumeral joint remains well reduced.  Mild degenerative changes noted.  There does appear to be some proximal humeral migration, with some space remaining between the acromion and the humeral head.  Impression: Right shoulder with mild glenohumeral degenerative changes, and some associated proximal humeral migration.   X-ray of the left shoulder demonstrates no acute injury.  There does appear to be some calcific tendinitis and deposition within the tendons of the rotator cuff.  Proximal humeral migration with approximately 7 mm of space between the humerus and the acromion.  Glenohumeral joint is reduced.  Mild degenerative changes within the shoulder.  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   New Medications:  No orders of the defined types were placed in this encounter.     Mordecai Rasmussen, MD  10/21/2020 1:26 PM

## 2020-10-22 ENCOUNTER — Ambulatory Visit: Payer: Medicare PPO | Admitting: Orthopedic Surgery

## 2020-10-23 ENCOUNTER — Other Ambulatory Visit: Payer: Self-pay | Admitting: Internal Medicine

## 2020-10-24 ENCOUNTER — Other Ambulatory Visit: Payer: Self-pay | Admitting: Internal Medicine

## 2020-10-29 ENCOUNTER — Other Ambulatory Visit (HOSPITAL_COMMUNITY): Payer: Self-pay | Admitting: Psychiatry

## 2020-11-02 ENCOUNTER — Other Ambulatory Visit: Payer: Self-pay | Admitting: Gastroenterology

## 2020-11-04 ENCOUNTER — Encounter (HOSPITAL_COMMUNITY): Payer: Self-pay

## 2020-11-04 NOTE — Progress Notes (Signed)
Attempted to contact the patient today regarding lung cancer screening. Unable to reach the patient at this time and no VM box available.

## 2020-11-11 ENCOUNTER — Other Ambulatory Visit: Payer: Self-pay | Admitting: Student

## 2020-11-11 NOTE — Telephone Encounter (Signed)
This is a [name] pt.  °

## 2020-11-17 ENCOUNTER — Other Ambulatory Visit (HOSPITAL_COMMUNITY): Payer: Self-pay | Admitting: Psychiatry

## 2020-11-17 ENCOUNTER — Other Ambulatory Visit: Payer: Self-pay | Admitting: Student

## 2020-11-17 NOTE — Telephone Encounter (Signed)
This is a [place] pt, Dr. Branch 

## 2020-11-24 ENCOUNTER — Encounter: Payer: Self-pay | Admitting: Gastroenterology

## 2020-12-06 ENCOUNTER — Other Ambulatory Visit: Payer: Self-pay | Admitting: Student

## 2020-12-16 NOTE — Progress Notes (Signed)
Cardiology Office Note  Date: 12/17/2020   ID: [name], DOB 07/22/1955, MRN [id number]  PCP:  [name], MD  Cardiologist:  Carlyle Dolly, MD Electrophysiologist:  None   Chief Complaint: Cardiac follow-up  History of Present Illness: [name] is a 65 y.o. male with a history of CAD,: CAD, COPD, diverticulitis, gastritis, GERD, GI bleed, hiatal hernia, hyperlipidemia, hypertension, leukocytosis, lung nodule, Schatzki's ring, sleep apnea, lung nodule, MI, restless leg syndrome, tobacco use, asthma.  Previous visit to [facility] on 08/28/2019 for worsening dyspnea and chest discomfort.  He was eventually transported to [facility] where he underwent a cardiac catheterization demonstrating a widely patent RCA stent and now totally occluded OM 2 branch.  Did have mild to moderate ostial LCx disease and mild plaque along the LAD.  OM branch was not felt to be a good PCI target given small caliber and location.  Continued medical management was recommended.  He was continued on aspirin, atorvastatin, lisinopril, Toprol, Imdur  He was last seen by Bernerd Pho, PA on 09/13/2019 and reported feeling weak since catheterization but this was improving.  He reported some intermittent chest discomfort which could occur at rest or with activity.  Continued with shortness of breath but had not used his inhaler or nebulizer treatments in several months.  He was having some dysphagia and was scheduled for an EGD GD with Dr. Sydell Axon later on in the month.  He was having headaches on Imdur.  It was recommended he   This is an unfortunate 65 year old male who recently had some significant stress with the death of his son and daughter.  He is here today with complaints of increasing shortness of breath.  He is a long-term smoker since the age of 29.  He states he used to smoke up to 2 packs/day.  States that recently he has cut down to 1 pack lasting approximately 2 days.  He complains of  bilateral leg pain worse when walking and having associated cramps.  He had a cardiac catheterization in March 2021 with evidence of widely patent RCA stent and totally occluded OM 2.  Did have mild to moderate ostial circumflex disease and mild plaque along with LAD.  Continued medical management was recommended.  His EKG today shows normal sinus rhythm with a rate of 81.  Moderate voltage criteria for LVH.  He states its been quite sometime since he has had lab work of any sort.  He states the last time he saw his PCP he was scheduled to have lab work but in the interim his daughter and son died and he had not had the lab work drawn.   Past Medical History:  Diagnosis Date   Anxiety    Anxiety and depression    ASCVD (arteriosclerotic cardiovascular disease)    a. s/p PTCA to LCx in 1993 b. BMS to RCA in 01/2012 with residual 80% OM2 stenosis and medical management recommended c. 08/2019: cath showing patent stents with occluded OM2 --> medical management.    Asthma    CAD (coronary artery disease)    Carcinoma in situ of colon 2004   rectal polyp   Colitis, ischemic ([place]) 2011   COPD (chronic obstructive pulmonary disease) (HCC)    mild ;excercise induced hypoxemia by cp stress test ;asthma ,bronchitis,   Depression    Diverticulosis    Gastritis 12/30/10   EGD Dr [name]   GERD (gastroesophageal reflux disease)    GI bleed  H. 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)    Leukocytosis    [name]   Lung nodule 07/16/2011   Myocardial infarction [facility])    age 16   Restless leg syndrome    Schatzki's ring    Sleep apnea    does not use CPAP:cannot tolerate, PCP aware   Syncope    Tobacco abuse    50 pack years continuing at one halp pack daily   Tubular adenoma of colon 06/2009   Colonosocpy Dr [name]    Past Surgical History:  Procedure Laterality Date    BIOPSY  12/19/2017   Procedure: BIOPSY;  Surgeon: [name], MD;  [facility];  Service: Endoscopy;;  gastric   CARDIAC CATHETERIZATION     CHOLECYSTECTOMY  2004   COLONOSCOPY  06/2009   normal terminal ileum, segmental mild inflammation of sigmoid colon (bx unremarkable), polyp, tubular adenoma   COLONOSCOPY N/A 07/31/2013   Dr.Rourk- redundant anal canal hemorrhoids, colonic diverticulosis, tubular adenoma   COLONOSCOPY W/ POLYPECTOMY  2004   rectal polyp with carcinoma in situ removed via colonoscopy   COLONOSCOPY WITH PROPOFOL N/A 09/15/2015   Dr. [name]: Scattered diverticula throughout the colon, 2 sessile polyps found in the descending colon and cecum, 5 mm in size.  Cecal polyp was sessile serrated polyp, descending colon polyp was a tubular adenoma.  He had a abnormal perianal exam along with grade 3 hemorrhoids.  Surveillance exam recommended for 5-year follow-up.   COLONOSCOPY WITH PROPOFOL N/A 12/19/2017   one 5 mm polyp in ascending colon and 1 cm sessile polyp in ascending s/p removal. Tubular adenoma. internal hemorrhoids   CORONARY ANGIOPLASTY WITH STENT PLACEMENT  01/26/2012   "1; total is now 2"   ESOPHAGOGASTRODUODENOSCOPY  02/2009   query Barrett's but bx negative   ESOPHAGOGASTRODUODENOSCOPY  12/30/2010   [name],gastritis, dilated 41F, sm HH, 1 small ulcer, Duodenal erosions, benign bx   ESOPHAGOGASTRODUODENOSCOPY (EGD) WITH ESOPHAGEAL DILATION N/A 09/12/2012   [id number] Schatzki's ring s/p Maloney dilator. Small hiatal hernia. negative path   ESOPHAGOGASTRODUODENOSCOPY (EGD) WITH ESOPHAGEAL DILATION N/A 07/31/2013   Dr. [name]- normal egd, s/p [facility] dilation empirically. Normal small bowel biopsies    ESOPHAGOGASTRODUODENOSCOPY (EGD) WITH PROPOFOL N/A 09/15/2015   Dr. [name]: Medium sized hiatal hernia, normal-appearing esophagus status post empiric dilation   ESOPHAGOGASTRODUODENOSCOPY (EGD) WITH PROPOFOL N/A 12/19/2017   erosive esophagitis s/p  dilation, erosive gastropathy, normal duodenum   ESOPHAGOGASTRODUODENOSCOPY (EGD) WITH PROPOFOL N/A 09/17/2019   Non-obstructing Schatzki's ring s/p dilation, medium-sized hiatal hernia, otherwise normal.   FLEXIBLE SIGMOIDOSCOPY  12/30/2010    [name],; internal hemorrhoids, anal papilla   HAND SURGERY     surgical intervention for injury of the fingers of the left hand many years ago   heart stent     HEMORRHOID BANDING     Dr. [name]   LEFT HEART CATH AND CORONARY ANGIOGRAPHY N/A 08/29/2019   Procedure: LEFT HEART CATH AND CORONARY ANGIOGRAPHY;  Surgeon: [name], MD;  [facility];  Service: Cardiovascular;  Laterality: N/A;   LEFT HEART CATHETERIZATION WITH CORONARY ANGIOGRAM N/A 01/26/2012   Procedure: LEFT HEART CATHETERIZATION WITH CORONARY ANGIOGRAM;  Surgeon: [name], MD;  Location: [facility] CATH LAB;  Service: Cardiovascular;  Laterality: N/A;   MALONEY DILATION N/A 09/15/2015   Procedure:  MALONEY DILATION;  Surgeon: [name], MD;  [facility];  Service: Endoscopy;  Laterality: N/A;   MALONEY DILATION N/A 12/19/2017   Procedure: [name] DILATION;  Surgeon: [name], MD;  [facility];  Service: Endoscopy;  Laterality: N/A;   MALONEY DILATION N/A 09/17/2019   Procedure: [name] DILATION;  Surgeon: [name], MD;  [facility];  Service: Endoscopy;  Laterality: N/A;   NASAL SEPTOPLASTY W/ TURBINOPLASTY  10/06/2011   Procedure: NASAL SEPTOPLASTY WITH TURBINATE REDUCTION;  Surgeon: Izora Gala, MD;  Location: [facility];  Service: ENT;  Laterality: Bilateral;   PERCUTANEOUS CORONARY STENT INTERVENTION (PCI-S) N/A 01/26/2012   Procedure: PERCUTANEOUS CORONARY STENT INTERVENTION (PCI-S);  Surgeon: [name], MD;  Location: [facility] CATH LAB;  Service: Cardiovascular;  Laterality: N/A;   POLYPECTOMY  09/15/2015   Procedure: POLYPECTOMY;  Surgeon: [name], MD;  [facility];  Service: Endoscopy;;  Cecal polyp  removed via cold snare/ Descending colon polyp removed via cold snare   POLYPECTOMY  12/19/2017   Procedure: POLYPECTOMY;  Surgeon: [name], MD;  [facility];  Service: Endoscopy;;  colon    Current Outpatient Medications  Medication Sig Dispense Refill   albuterol (PROVENTIL) (2.5 MG/3ML) 0.083% nebulizer solution Take 3 mLs (2.5 mg total) by nebulization every 6 (six) hours as needed for wheezing or shortness of breath. 75 mL 1   albuterol (VENTOLIN HFA) 108 (90 Base) MCG/ACT inhaler Inhale 2 puffs into the lungs every 4 (four) hours as needed for wheezing or shortness of breath. 18 g 1   ALPRAZolam (XANAX) 1 MG tablet TAKE 1 TABLET BY MOUTH THREE TIMES A DAY AS NEEDED FOR ANXIETY 90 tablet 2   aspirin EC 81 MG tablet Take 81 mg by mouth daily.     atorvastatin (LIPITOR) 80 MG tablet TAKE 1 TABLET BY MOUTH EVERY DAY 90 tablet 3   budesonide-formoterol (SYMBICORT) 80-4.5 MCG/ACT inhaler Take 2 puffs first thing in am and then another 2 puffs about 12 hours later. 1 Inhaler 12   CREON 36000-114000 units CPEP capsule TAKE 2 CAPSULES BY MOUTH THREE TIMES DAILY WITH MEALS. 1 CAPSULE WITH SNACKS 240 capsule 3   dicyclomine (BENTYL) 10 MG capsule TAKE 1 CAPSULE (10 MG TOTAL) BY MOUTH 4 (FOUR) TIMES DAILY - BEFORE MEALS AND AT BEDTIME. 360 capsule 1   FLUoxetine (PROZAC) 20 MG capsule Take 1 capsule (20 mg total) by mouth daily. 90 capsule 2   FLUoxetine (PROZAC) 40 MG capsule Take 1 capsule (40 mg total) by mouth daily. Take with 20 mg to equal 60 mg daily 90 capsule 2   fluticasone (FLONASE) 50 MCG/ACT nasal spray Place 2 sprays into both nostrils daily. (Patient taking differently: Place 2 sprays into both nostrils daily as needed for allergies.) 16 g 5   gabapentin (NEURONTIN) 300 MG capsule Take 2 capsules (600 mg total) by mouth 2 (two) times daily. 480 capsule 2   HYDROcodone-acetaminophen (NORCO) 7.5-325 MG tablet Take 1 tablet by mouth 2 (two) times daily as needed for  moderate pain. 60 tablet 0   hydrocortisone (ANUSOL-HC) 2.5 % rectal cream Place 1 application rectally 2 (two) times daily. 30 g 1   isosorbide mononitrate (IMDUR) 30 MG 24 hr tablet Take 1 tablet (30 mg total) by mouth daily. 90 tablet 3   meloxicam (MOBIC) 15 MG tablet TAKE 1 TABLET BY MOUTH EVERY DAY 30 tablet 5   metoprolol succinate (TOPROL-XL) 50 MG  24 hr tablet TAKE 1 TABLET (50 MG TOTAL) BY MOUTH DAILY. PLEASE SCHEDULE FOLLOW UP APPOINTMENT. THANK YOU 30 tablet 2   naloxone (NARCAN) nasal spray 4 mg/0.1 mL Use as directed 1 each 0   nitroGLYCERIN (NITROSTAT) 0.4 MG SL tablet Place 1 tablet (0.4 mg total) under the tongue every 5 (five) minutes as needed for chest pain. 20 tablet 5   OLANZapine (ZYPREXA) 10 MG tablet Take 1 tablet (10 mg total) by mouth at bedtime. 90 tablet 2   pantoprazole (PROTONIX) 40 MG tablet TAKE 1 TABLET BY MOUTH TWICE A DAY BEFORE A MEAL 180 tablet 3   rOPINIRole (REQUIP) 2 MG tablet TAKE 1 TABLET BY MOUTH EVERYDAY AT BEDTIME 90 tablet 1   telmisartan (MICARDIS) 40 MG tablet Take 1 tablet (40 mg total) by mouth daily. 30 tablet 11   traZODone (DESYREL) 50 MG tablet TAKE 1 TABLET BY MOUTH EVERYDAY AT BEDTIME 90 tablet 1   HYDROcodone-acetaminophen (NORCO) 7.5-325 MG tablet Take one tablet by mouth 2 ( two ) times daily as needed for moderate pain (Patient not taking: Reported on 12/17/2020) 60 tablet 0   HYDROcodone-acetaminophen (NORCO) 7.5-325 MG tablet Take one tablet by mouth 2 (two) times daily as needed for moderate pain (Patient not taking: Reported on 12/17/2020) 60 tablet 0   HYDROcodone-acetaminophen (NORCO) 7.5-325 MG tablet Take one tablet po 2 times daily prn for moderate pain (Patient not taking: Reported on 12/17/2020) 60 tablet 0   No current facility-administered medications for this visit.   Allergies:  Patient has no known allergies.   Social History: The patient  reports that he has been smoking cigarettes. He started smoking about 51 years ago. He  has a 48.00 pack-year smoking history. He has quit using smokeless tobacco. He reports current alcohol use. He reports that he does not use drugs.   Family History: The patient's family history includes Cancer in his maternal aunt and paternal uncle; Depression in his mother; Heart disease in his mother; Hypertension in his brother; Kidney failure in his maternal uncle; Lung disease in his father.   ROS:  Please see the history of present illness. Otherwise, complete review of systems is positive for none.  All other systems are reviewed and negative.   Physical Exam: VS:  BP 110/60   Pulse 73   Ht 5' 10" (1.778 m)   Wt 153 lb (69.4 kg)   SpO2 98%   BMI 21.95 kg/m , BMI Body mass index is 21.95 kg/m.  Wt Readings from Last 3 Encounters:  12/17/20 153 lb (69.4 kg)  10/21/20 173 lb (78.5 kg)  10/06/20 170 lb (77.1 kg)    General: Patient appears comfortable at rest. Neck: Supple, no elevated JVP or carotid bruits, no thyromegaly. Lungs: Clear to auscultation, nonlabored breathing at rest. Cardiac: Regular rate and rhythm, no S3 or significant systolic murmur, no pericardial rub. Extremities: No pitting edema, distal pulses 2+. Skin: Warm and dry. Musculoskeletal: No kyphosis. Neuropsychiatric: Alert and oriented x3, affect grossly appropriate.  ECG: December 17, 2020 normal sinus rhythm rate of 81, moderate voltage criteria for LVH, may be normal variant  Recent Labwork: 05/22/2020: BUN 16; Creatinine, Ser 0.87; Magnesium 2.1; Potassium 3.9; Sodium 141     Component Value Date/Time   CHOL 129 08/29/2019 0236   CHOL 138 12/13/2017 1603   TRIG 413 (H) 08/29/2019 0236   HDL 31 (L) 08/29/2019 0236   HDL 34 (L) 12/13/2017 1603   CHOLHDL 4.2 08/29/2019 0236  VLDL UNABLE TO CALCULATE IF TRIGLYCERIDE OVER 400 mg/dL 08/29/2019 0236   LDLCALC UNABLE TO CALCULATE IF TRIGLYCERIDE OVER 400 mg/dL 08/29/2019 0236   LDLCALC 43 12/13/2017 1603   LDLDIRECT 51.0 08/29/2019 0236    Other Studies  Reviewed Today:  Cardiac Catheterization: 08/2019 Previously placed Prox RCA stent (bare-metal stent) is widely patent. 2nd Mrg lesion is 100% stenosed. Previously documented is 80%. Ost Cx to Prox Cx lesion is 45% stenosed. Prox Cx to Mid Cx lesion is 20% stenosed with 20% stenosed side branch in 1st Mrg. -------------------- The left ventricular ejection fraction is 50-55% by visual estimate. LV end diastolic pressure is normal. There is no aortic valve stenosis. 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   SUMMARY Two-vessel CAD with widely patent RCA stent (at most codominant RCA), now totally occluded small caliber OM 2 branch previously noted to be 80%.  Mild to moderate (45%) ostial LCx disease. Relatively preserved EF of roughly 55% with basal to mid inferior hypokinesis.     RECOMMENDATIONS Optimization of medical management.  The OM branch not likely good PCI target as it is not even visible where it comes off the main circumflex. Anticipate discharge home either today if stable or to  Assessment and Plan:  1. CAD in native artery   2. Chronic obstructive pulmonary disease, unspecified COPD type ([place])   3. Essential hypertension   4. Hyperlipidemia LDL goal <70   5. Cigarette smoker   6. SOB (shortness of breath)   7. Pain of lower extremity, unspecified laterality   8. Medication management    1. CAD in native artery History of CAD.  Describes some mild chest discomfort occasionally.  No classic chest pain, pressure, tightness, radiation or associated symptoms.  Continue aspirin 81 mg daily, continue Imdur 30 mg daily.  Toprol 50 mg daily.  Sublingual nitroglycerin.  2. Chronic obstructive pulmonary disease, unspecified COPD type ([place]) Long history of smoking since age 81.  Patient states at 1 point he was smoking up to 2 packs/day.  He states now he is currently down to 1 pack every 2 days.  He does have albuterol  nebulizers and metered-dose inhalers.  3. Essential hypertension Blood pressure well controlled on current therapy.  Continue telmisartan 40 mg daily.  Continue Toprol-XL 50 mg daily.  Patient states she has not had any lab work in quite some times.  Please get a c-Met, CBC, thyroid profile, vitamin D, vitamin B12.  4. Hyperlipidemia LDL goal <70 History of hyperlipidemia.  Continue 80 mg of atorvastatin.  Please get a lipid panel.  Patient states he has not had any blood work in quite some time.  5. Cigarette smoker Continues to smoke 1 pack every 2 days.  States he is a long-term smoker from age 44 until present.  He states at 1 point he was smoking 2 packs of cigarettes daily.  Advised cessation.    6. SOB (shortness of breath) States he is noticing increasing shortness of breath which appears to be progressing to some degree.  Please get an echocardiogram.  7. Pain of lower extremity, unspecified laterality Complaining of bilateral leg pain worse when walking and relieved at rest.  Please get a lower arterial duplex.  8. Medication management    Medication Adjustments/Labs and Tests Ordered: Current medicines are reviewed at length with the patient today.  Concerns regarding medicines are outlined above.   Disposition: Follow-up with Dr. Harl Bowie or APP  6 to 8 weeks  Signed, Levell July, NP 12/17/2020 10:44 AM    [employer] [facility] [place], [place], [place] [address] Phone: [phone]; Fax: [phone]

## 2020-12-17 ENCOUNTER — Encounter: Payer: Self-pay | Admitting: Family Medicine

## 2020-12-17 ENCOUNTER — Ambulatory Visit: Payer: Medicare PPO | Admitting: Family Medicine

## 2020-12-17 ENCOUNTER — Telehealth: Payer: Self-pay | Admitting: Family Medicine

## 2020-12-17 VITALS — BP 110/60 | HR 73 | Ht 70.0 in | Wt 153.0 lb

## 2020-12-17 DIAGNOSIS — I2510 Atherosclerotic heart disease of native coronary artery without angina pectoris: Secondary | ICD-10-CM

## 2020-12-17 DIAGNOSIS — J449 Chronic obstructive pulmonary disease, unspecified: Secondary | ICD-10-CM | POA: Diagnosis not present

## 2020-12-17 DIAGNOSIS — M79606 Pain in leg, unspecified: Secondary | ICD-10-CM

## 2020-12-17 DIAGNOSIS — E785 Hyperlipidemia, unspecified: Secondary | ICD-10-CM | POA: Diagnosis not present

## 2020-12-17 DIAGNOSIS — F17210 Nicotine dependence, cigarettes, uncomplicated: Secondary | ICD-10-CM | POA: Diagnosis not present

## 2020-12-17 DIAGNOSIS — Z79899 Other long term (current) drug therapy: Secondary | ICD-10-CM

## 2020-12-17 DIAGNOSIS — R0602 Shortness of breath: Secondary | ICD-10-CM

## 2020-12-17 DIAGNOSIS — I10 Essential (primary) hypertension: Secondary | ICD-10-CM

## 2020-12-17 NOTE — Patient Instructions (Addendum)
Medication Instructions:  Your physician recommends that you continue on your current medications as directed. Please refer to the Current Medication list given to you today.  *If you need a refill on your cardiac medications before your next appointment, please call your pharmacy*   Lab Work: CMET CBC THYROID PANEL VITAMIN D VITAMIN B-12 If you have labs (blood work) drawn today and your tests are completely normal, you will receive your results only by: [place] (if you have MyChart) OR A paper copy in the mail If you have any lab test that is abnormal or we need to change your treatment, we will call you to review the results.   Testing/Procedures: Your physician has requested that you have an echocardiogram. Echocardiography is a painless test that uses sound waves to create images of your heart. It provides your doctor with information about the size and shape of your heart and how well your heart's chambers and valves are working. This procedure takes approximately one hour. There are no restrictions for this procedure.  Lower Extremity Duplex    Follow-Up: At [facility], you and your health needs are our priority.  As part of our continuing mission to provide you with exceptional heart care, we have created designated Provider Care Teams.  These Care Teams include your primary Cardiologist (physician) and Advanced Practice Providers (APPs -  Physician Assistants and Nurse Practitioners) who all work together to provide you with the care you need, when you need it.  We recommend signing up for the patient portal called "MyChart".  Sign up information is provided on this After Visit Summary.  MyChart is used to connect with patients for Virtual Visits (Telemedicine).  Patients are able to view lab/test results, encounter notes, upcoming appointments, etc.  Non-urgent messages can be sent to your provider as well.   To learn more about what you can do with MyChart, go to  [email or web address].    Your next appointment:   6-8 week(s)  The format for your next appointment:   In Person  Provider:   Katina Dung, NP   Other Instructions

## 2020-12-17 NOTE — Addendum Note (Signed)
Addended by: Christella Scheuermann C on: 12/17/2020 11:16 AM   Modules accepted: Orders

## 2020-12-17 NOTE — Telephone Encounter (Signed)
[name], I'm not sure you meant to send this to preop. Thx.

## 2020-12-17 NOTE — Telephone Encounter (Signed)
Pre-cert Verification for the following procedure     DATE: 12/18/2020  LOCATION: [facility] [place] OFF[facility]

## 2020-12-18 ENCOUNTER — Encounter: Payer: Self-pay | Admitting: Family Medicine

## 2020-12-18 ENCOUNTER — Other Ambulatory Visit (HOSPITAL_COMMUNITY)
Admission: RE | Admit: 2020-12-18 | Discharge: 2020-12-18 | Disposition: A | Discharge status: Home / Self Care | Payer: Medicare PPO | Source: Ambulatory Visit | Attending: Family Medicine | Admitting: Family Medicine

## 2020-12-18 ENCOUNTER — Ambulatory Visit (INDEPENDENT_AMBULATORY_CARE_PROVIDER_SITE_OTHER): Payer: Medicare PPO

## 2020-12-18 ENCOUNTER — Other Ambulatory Visit: Payer: Self-pay

## 2020-12-18 DIAGNOSIS — Z79899 Other long term (current) drug therapy: Secondary | ICD-10-CM | POA: Diagnosis not present

## 2020-12-18 DIAGNOSIS — R0602 Shortness of breath: Secondary | ICD-10-CM

## 2020-12-18 LAB — COMPREHENSIVE METABOLIC PANEL
ALT: 25 U/L (ref 0–44)
AST: 23 U/L (ref 15–41)
Albumin: 3.7 g/dL (ref 3.5–5.0)
Alkaline Phosphatase: 81 U/L (ref 38–126)
Anion gap: 7 (ref 5–15)
BUN: 16 mg/dL (ref 8–23)
CO2: 26 mmol/L (ref 22–32)
Calcium: 9 mg/dL (ref 8.9–10.3)
Chloride: 105 mmol/L (ref 98–111)
Creatinine, Ser: 0.73 mg/dL (ref 0.61–1.24)
GFR, Estimated: >60 mL/min (ref >60)
Glucose, Bld: 108 mg/dL — ABNORMAL HIGH (ref 70–99)
Potassium: 4 mmol/L (ref 3.5–5.1)
Sodium: 138 mmol/L (ref 135–145)
Total Bilirubin: 0.9 mg/dL (ref 0.3–1.2)
Total Protein: 6 g/dL — ABNORMAL LOW (ref 6.5–8.1)

## 2020-12-18 LAB — CBC
HCT: 45.4 % (ref 39.0–52.0)
Hemoglobin: 15.5 g/dL (ref 13.0–17.0)
MCH: 33 pg (ref 26.0–34.0)
MCHC: 34.1 g/dL (ref 30.0–36.0)
MCV: 96.6 fL (ref 80.0–100.0)
Platelets: 157 10*3/uL (ref 150–400)
RBC: 4.7 MIL/uL (ref 4.22–5.81)
RDW: 13.4 % (ref 11.5–15.5)
WBC: 12.6 10*3/uL — ABNORMAL HIGH (ref 4.0–10.5)
nRBC: 0 % (ref 0.0–0.2)

## 2020-12-18 LAB — LIPID PANEL
Cholesterol: 111 mg/dL (ref 0–200)
HDL: 40 mg/dL — ABNORMAL LOW (ref >40)
LDL Cholesterol: 54 mg/dL (ref 0–99)
Total CHOL/HDL Ratio: 2.8 RATIO
Triglycerides: 83 mg/dL (ref <150)
VLDL: 17 mg/dL (ref 0–40)

## 2020-12-18 LAB — ECHOCARDIOGRAM COMPLETE
AV Vena cont: 0.18 cm
Area-P 1/2: 3.68 cm2
Calc EF: 58.6 %
MV M vel: 4.14 m/s
MV Peak grad: 68.7 mmHg
P 1/2 time: 2743 msec
S' Lateral: 3.79 cm
Single Plane A2C EF: 58.6 %
Single Plane A4C EF: 56.7 %

## 2020-12-18 LAB — VITAMIN D 25 HYDROXY (VIT D DEFICIENCY, FRACTURES): Vit D, 25-Hydroxy: 34.38 ng/mL (ref 30–100)

## 2020-12-18 LAB — VITAMIN B12: Vitamin B-12: 141 pg/mL — ABNORMAL LOW (ref 180–914)

## 2020-12-22 ENCOUNTER — Encounter (HOSPITAL_COMMUNITY): Payer: Self-pay

## 2020-12-22 NOTE — Progress Notes (Unsigned)
Received referral for initial lung cancer screening scan. Contacted patient and obtained smoking history (started age 65 smoking socially until the age of 76 when he increased to 1PPD, he reports smoking 1PPD until the age of 64 except for a 3 year time span when he increased to 2PPD, current smoker and reports smoking about a 1/2PPD, 51 pack year) as well as answering questions related to the screening process. Patient reports a recent, unexplained 30lb weight loss. Referring provider made aware. Referral pending further investigation by PCP.

## 2020-12-23 ENCOUNTER — Telehealth: Payer: Self-pay | Admitting: *Deleted

## 2020-12-23 NOTE — Telephone Encounter (Signed)
-----   Message from Verta Ellen., NP sent at 12/19/2020  8:56 AM EDT ----- Please call the patient and let him know the pumping function of his heart has improved and is considered back to normal.  Otherwise no significant change since last echo in 2017.  He has a mild leak in his mitral valve this would not cause any significant shortness of breath.  Thank you  Verta Ellen, NP  12/19/2020 8:54 AM

## 2020-12-23 NOTE — Telephone Encounter (Signed)
Laurine Blazer, LPN  8/65/7846  9:62 PM EDT Back to Top     Notified, copy to pcp.

## 2020-12-29 ENCOUNTER — Ambulatory Visit: Payer: Medicare PPO | Admitting: Gastroenterology

## 2021-01-05 ENCOUNTER — Ambulatory Visit (INDEPENDENT_AMBULATORY_CARE_PROVIDER_SITE_OTHER): Payer: Medicare PPO | Admitting: Family Medicine

## 2021-01-05 ENCOUNTER — Other Ambulatory Visit: Payer: Self-pay

## 2021-01-05 VITALS — BP 120/82 | Temp 97.3°F | Ht 67.5 in | Wt 150.6 lb

## 2021-01-05 DIAGNOSIS — Z79891 Long term (current) use of opiate analgesic: Secondary | ICD-10-CM

## 2021-01-05 DIAGNOSIS — E538 Deficiency of other specified B group vitamins: Secondary | ICD-10-CM

## 2021-01-05 DIAGNOSIS — F4321 Adjustment disorder with depressed mood: Secondary | ICD-10-CM

## 2021-01-05 DIAGNOSIS — G894 Chronic pain syndrome: Secondary | ICD-10-CM

## 2021-01-05 DIAGNOSIS — Z125 Encounter for screening for malignant neoplasm of prostate: Secondary | ICD-10-CM | POA: Diagnosis not present

## 2021-01-05 DIAGNOSIS — Z0001 Encounter for general adult medical examination with abnormal findings: Secondary | ICD-10-CM

## 2021-01-05 DIAGNOSIS — D72828 Other elevated white blood cell count: Secondary | ICD-10-CM

## 2021-01-05 DIAGNOSIS — Z0000 Encounter for general adult medical examination without abnormal findings: Secondary | ICD-10-CM

## 2021-01-05 MED ORDER — HYDROCODONE-ACETAMINOPHEN 7.5-325 MG PO TABS: ORAL_TABLET | ORAL | 0 refills | Status: DC

## 2021-01-05 MED ORDER — CYANOCOBALAMIN 1000 MCG/ML IJ SOLN
1000.0000 ug | Freq: Once | INTRAMUSCULAR | Status: AC
  Administered 2021-01-05: 1000 ug via INTRAMUSCULAR

## 2021-01-05 NOTE — Progress Notes (Signed)
Subjective:    Patient ID: [name], male    DOB: [birth year], 65 y.o.   MRN: [id number]  HPI AWV- Annual Wellness Visit  The patient was seen for their annual wellness visit. The patient's past medical history, surgical history, and family history were reviewed. Pertinent vaccines were reviewed ( tetanus, pneumonia, shingles, flu) The patient's medication list was reviewed and updated.  The height and weight were entered.  BMI recorded in electronic record elsewhere  Cognitive screening was completed. Outcome of Mini - Cog: pass   Falls /depression screening electronically recorded within record elsewhere  Current tobacco usage: pt smokes some  (All patients who use tobacco were given written and verbal information on quitting)  Recent listing of emergency department/hospitalizations over the past year were reviewed.  current specialist the patient sees on a regular basis: cardiology;GI   Medicare annual wellness visit patient questionnaire was reviewed.  A written screening schedule for the patient for the next 5-10 years was given. Appropriate discussion of followup regarding next visit was discussed.   This patient was seen today for chronic pain  The medication list was reviewed and updated.  Location of Pain for which the patient has been treated with regarding narcotics: Chronic low back pain radiates into the legs.  Present for years.  Onset of this pain:  -Compliance with medication: Hydrocodone 7.5-325  - Number patient states they take daily: one in the morning and some time one at night  -when was the last dose patient took? This morning about 7 am  The patient was advised the importance of maintaining medication and not using illegal substances with these.  Here for refills and follow up  The patient was educated that we can provide 3 monthly scripts for their medication, it is their responsibility to follow the instructions.  Side effects or  complications from medications: none  Patient is aware that pain medications are meant to minimize the severity of the pain to allow their pain levels to improve to allow for better function. They are aware of that pain medications cannot totally remove their pain.  Due for UDT ( at least once per year) : Will obtain today   Scale of 1 to 10 ( 1 is least 10 is most) Your pain level without the medicine: 8 Your pain level with medication: 5  Scale 1 to 10 ( 1-helps very little, 10 helps very well) How well does your pain medication reduce your pain so you can function better through out the day? 6  Quality of the pain: Burning throbbing pain  Persistence of the pain: Present 24 hours a day  Modifying factors: Worse with activity        Review of Systems     Objective:   Physical Exam General-in no acute distress Eyes-no discharge Lungs-respiratory rate normal, CTA CV-no murmurs,RRR Extremities skin warm dry no edema Neuro grossly normal Behavior normal, alert  Prostate exam deferred      Assessment & Plan:   1. Encounter for long-term opiate analgesic use The patient was seen in followup for chronic pain. A review over at their current pain status was discussed. Drug registry was checked. Prescriptions were given.  Regular follow-up recommended. Discussion was held regarding the importance of compliance with medication as well as pain medication contract.  Patient was informed that medication may cause drowsiness and should not be combined  with other medications/alcohol or street drugs. If the patient feels medication is causing altered alertness then  do not drive or operate dangerous equipment. 3 prescriptions were sent in Patient was cautioned not to operate machinery if feeling drowsy Cautioned to keep medication in safe place  - ToxASSURE Select 13 (MW), Urine  2. Encounter for subsequent annual wellness visit (AWV) in Medicare patient Adult  wellness-complete.wellness physical was conducted today. Importance of diet and exercise were discussed in detail.  In addition to this a discussion regarding safety was also covered. We also reviewed over immunizations and gave recommendations regarding current immunization needed for age.  In addition to this additional areas were also touched on including: Preventative health exams needed:  Colonoscopy 2019  Patient was advised yearly wellness exam   3. B12 deficiency B12 shots recommended 1 mL IM weekly for 6 weeks then 2000 mcg daily orally  4. Grief Severe grief after the loss of his son several weeks ago-patient denies being suicidal.  Will follow-up with psychiatry.  Continue current medications.  5. Other elevated white blood cell (WBC) count Patient is a smoker.  Repeat CBC in 3 months.  We did discuss CT scan screening of lungs patient defers currently

## 2021-01-05 NOTE — Patient Instructions (Addendum)
[name] is low.  Weekly [name] shots for 6 weeks starting today.  Then start taking 2000 mcg [name] daily by mouth-this is available over-the-counter. Please follow-up in 3 months for your next office visit with follow-up blood work before that visit.    Managing Loss, Adult People experience loss in many different ways throughout their lives. Events such as moving, changing jobs, and losing friends can create a sense of loss. The loss may be as serious as a major health change, divorce, death of a pet, or death of a loved one. All of these types of loss are likely to create a physical and emotional reaction known as grief. Grief is the result of a major change or an absence of something or someone that you count on. Grief is anormal reaction to loss. A variety of factors can affect your grieving experience, including: The nature of your loss. Your relationship to what or whom you lost. Your understanding of grief and how to manage it. Your support system. How to manage lifestyle changes Keep to your normal routine as much as possible. If you have trouble focusing or doing normal activities, it is acceptable to take some time away from your normal routine. Spend time with friends and loved ones. Eat a healthy diet, get plenty of sleep, and rest when you feel tired. How to recognize changes  The way that you deal with your grief will affect your ability to function as you normally do. When grieving, you may experience these changes: Numbness, shock, sadness, anxiety, anger, denial, and guilt. Thoughts about death. Unexpected crying. A physical sensation of emptiness in your stomach. Problems sleeping and eating. Tiredness (fatigue). Loss of interest in normal activities. Dreaming about or imagining seeing the person who died. A need to remember what or whom you lost. Difficulty thinking about anything other than your loss for a period of time. Relief. If you have been expecting the loss  for a while, you may feel a sense of relief when it happens. Follow these instructions at home: Activity Express your feelings in healthy ways, such as: Talking with others about your loss. It may be helpful to find others who have had a similar loss, such as a support group. Writing down your feelings in a journal. Doing physical activities to release stress and emotional energy. Doing creative activities like painting, sculpting, or playing or listening to music. Practicing resilience. This is the ability to recover and adjust after facing challenges. Reading some resources that encourage resilience may help you to learn ways to practice those behaviors.  General instructions Be patient with yourself and others. Allow the grieving process to happen, and remember that grieving takes time. It is likely that you may never feel completely done with some grief. You may find a way to move on while still cherishing memories and feelings about your loss. Accepting your loss is a process. It can take months or longer to adjust. Keep all follow-up visits as told by your health care provider. This is important. Where to find support To get support for managing loss: Ask your health care provider for help and recommendations, such as grief counseling or therapy. Think about joining a support group for people who are managing a loss. Where to find more information You can find more information about managing loss from: American Society of Clinical Oncology: www.cancer.net American Psychological Association: TVSte[employer].ch Contact a health care provider if: Your grief is extreme and keeps getting worse. You have ongoing grief  that does not improve. Your body shows symptoms of grief, such as illness. You feel depressed, anxious, or lonely. Get help right away if: You have thoughts about hurting yourself or others. If you ever feel like you may hurt yourself or others, or have thoughts about taking your  own life, get help right away. You can go to your nearest emergency department or call: Your local emergency services (911 in the [place] A suicide crisis helpline, such as the [facility] at 385-350-2270. This is open 24 hours a day. Summary Grief is the result of a major change or an absence of someone or something that you count on. Grief is a normal reaction to loss. The depth of grief and the period of recovery depend on the type of loss and your ability to adjust to the change and process your feelings. Processing grief requires patience and a willingness to accept your feelings and talk about your loss with people who are supportive. It is important to find resources that work for you and to realize that people experience grief differently. There is not one grieving process that works for everyone in the same way. Be aware that when grief becomes extreme, it can lead to more severe issues like isolation, depression, anxiety, or suicidal thoughts. Talk with your health care provider if you have any of these issues. This information is not intended to replace advice given to you by your health care provider. Make sure you discuss any questions you have with your healthcare provider. Document Revised: 11/22/2019 Document Reviewed: 11/22/2019 Elsevier Patient Education  [employer].

## 2021-01-08 LAB — TOXASSURE SELECT 13 (MW), URINE

## 2021-01-13 ENCOUNTER — Other Ambulatory Visit: Payer: Self-pay

## 2021-01-13 ENCOUNTER — Other Ambulatory Visit (INDEPENDENT_AMBULATORY_CARE_PROVIDER_SITE_OTHER): Payer: Medicare PPO

## 2021-01-13 DIAGNOSIS — E538 Deficiency of other specified B group vitamins: Secondary | ICD-10-CM

## 2021-01-13 MED ORDER — CYANOCOBALAMIN 1000 MCG/ML IJ SOLN
1000.0000 ug | Freq: Once | INTRAMUSCULAR | Status: AC
  Administered 2021-01-13: 1000 ug via INTRAMUSCULAR

## 2021-01-20 ENCOUNTER — Other Ambulatory Visit: Payer: Self-pay

## 2021-01-20 ENCOUNTER — Other Ambulatory Visit (INDEPENDENT_AMBULATORY_CARE_PROVIDER_SITE_OTHER): Payer: Medicare PPO

## 2021-01-20 DIAGNOSIS — E538 Deficiency of other specified B group vitamins: Secondary | ICD-10-CM

## 2021-01-20 MED ORDER — CYANOCOBALAMIN 1000 MCG/ML IJ SOLN
1000.0000 ug | Freq: Once | INTRAMUSCULAR | Status: AC
  Administered 2021-01-20: 1000 ug via INTRAMUSCULAR

## 2021-01-26 ENCOUNTER — Ambulatory Visit (HOSPITAL_COMMUNITY)
Admission: RE | Admit: 2021-01-26 | Discharge: 2021-01-26 | Disposition: A | Discharge status: Home / Self Care | Payer: Medicare PPO | Source: Ambulatory Visit | Attending: Family Medicine | Admitting: Family Medicine

## 2021-01-26 ENCOUNTER — Other Ambulatory Visit: Payer: Self-pay

## 2021-01-26 DIAGNOSIS — M79606 Pain in leg, unspecified: Secondary | ICD-10-CM | POA: Diagnosis not present

## 2021-01-26 DIAGNOSIS — M79661 Pain in right lower leg: Secondary | ICD-10-CM | POA: Diagnosis not present

## 2021-01-26 DIAGNOSIS — M79662 Pain in left lower leg: Secondary | ICD-10-CM | POA: Diagnosis not present

## 2021-01-27 ENCOUNTER — Telehealth: Payer: Self-pay | Admitting: *Deleted

## 2021-01-27 ENCOUNTER — Other Ambulatory Visit (INDEPENDENT_AMBULATORY_CARE_PROVIDER_SITE_OTHER): Payer: Medicare PPO

## 2021-01-27 DIAGNOSIS — E538 Deficiency of other specified B group vitamins: Secondary | ICD-10-CM | POA: Diagnosis not present

## 2021-01-27 MED ORDER — CYANOCOBALAMIN 1000 MCG/ML IJ SOLN
1000.0000 ug | Freq: Once | INTRAMUSCULAR | Status: AC
  Administered 2021-01-27: 1000 ug via INTRAMUSCULAR

## 2021-01-27 NOTE — Telephone Encounter (Signed)
Laurine Blazer, LPN  [phone] AM EDT Back to Top    Wife Gregary Signs ) notified, copy to pcp.

## 2021-01-27 NOTE — Telephone Encounter (Signed)
-----   Message from Verta Ellen., NP sent at 01/27/2021  7:52 AM EDT ----- Please call the patient and let him know his lower extremity ABIs showed no evidence of arterial disease.  The test was normal.  Right ABI was 1.14.  Left ABI was 1.12 anything less than 0.90 would be considered evidence of lower extremity arterial disease.  This study showed greater than 1 which is normal in both legs.  Thanks. Verta Ellen, NP  01/27/2021 7:50 AM

## 2021-01-28 NOTE — Progress Notes (Signed)
Cardiology Office Note  Date: 01/29/2021   ID: [name], DOB [birth year], MRN [id number]  PCP:  [name], MD  Cardiologist:  Carlyle Dolly, MD Electrophysiologist:  None   Chief Complaint: Cardiac follow-up  History of Present Illness: [name] is a 65 y.o. male with a history of CAD,: CAD, COPD, diverticulitis, gastritis, GERD, GI bleed, hiatal hernia, hyperlipidemia, hypertension, leukocytosis, lung nodule, Schatzki's ring, sleep apnea, lung nodule, MI, restless leg syndrome, tobacco use, asthma.  Previous visit to [facility] on 08/28/2019 for worsening dyspnea and chest discomfort.  He was eventually transported to [facility] where he underwent a cardiac catheterization demonstrating a widely patent RCA stent and now totally occluded OM 2 branch.  Did have mild to moderate ostial LCx disease and mild plaque along the LAD.  OM branch was not felt to be a good PCI target given small caliber and location.  Continued medical management was recommended.  He was continued on aspirin, atorvastatin, lisinopril, Toprol, Imdur  He was last seen by Bernerd Pho, PA on 09/13/2019 and reported feeling weak since catheterization but this was improving.  He reported some intermittent chest discomfort which could occur at rest or with activity.  Continued with shortness of breath but had not used his inhaler or nebulizer treatments in several months.  He was having some dysphagia and was scheduled for an EGD GD with Dr. Sydell Axon later on in the month.  He was having headaches on Imdur.  It was recommended he   This is an unfortunate 65 year old male who recently had some significant stress with the death of his son and daughter.  He is here today with complaints of increasing shortness of breath.  He is a long-term smoker since the age of 35.  He states he used to smoke up to 2 packs/day.  At last visit he had cut down to 1 pack lasting approximately 2 days.  He complained of bilateral  leg pain worse when walking and having associated cramps.  He had a cardiac catheterization in March 2021 with evidence of widely patent RCA stent and totally occluded OM 2.  Did have mild to moderate ostial circumflex disease and mild plaque along with LAD.  Continued medical management was recommended.  His EKG today shows normal sinus rhythm with a rate of 81.  Moderate voltage criteria for LVH.  He states its been quite sometime since he has had lab work of any sort.  He states the last time he saw his PCP he was scheduled to have lab work but in the interim his daughter and son died and he had not had the lab work drawn.  He is here for follow-up after recent ABI for leg pain.  ABIs were normal.  Had an echocardiogram for shortness of breath demonstrating an EF of 55 to 60% with hypokinesis/akinesis of the inferolateral wall (base, mid), inferior wall (base, mid) compared to report from echo in 2017 there was no significant change.  He has mild mitral regurgitation and trivial aortic regurgitation.  Previous echo showed EF of 50% in 2017.  Recent labs looked good except for he had low vitamin B12.  He is receiving B12 injections through his PCP.  He has not stopped smoking.  He states that his nebulizer machine at home is broken.   He continues with shortness of breath when walking short distances.  Has a significant history of smoking and continues to smoke.  He has not seen a  pulmonary specialist in quite some time.   Past Medical History:  Diagnosis Date   Anxiety    Anxiety and depression    ASCVD (arteriosclerotic cardiovascular disease)    a. s/p PTCA to LCx in 1993 b. BMS to RCA in 01/2012 with residual 80% OM2 stenosis and medical management recommended c. 08/2019: cath showing patent stents with occluded OM2 --> medical management.    Asthma    CAD (coronary artery disease)    Carcinoma in situ of colon 2004   rectal polyp   Colitis, ischemic ([place]) 2011   COPD (chronic obstructive  pulmonary disease) (HCC)    mild ;excercise induced hypoxemia by cp stress test ;asthma ,bronchitis,   Depression    Diverticulosis    Gastritis 12/30/10   EGD Dr [name]   GERD (gastroesophageal reflux disease)    GI bleed    H. 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)    Leukocytosis    [name]   Lung nodule 07/16/2011   Myocardial infarction [facility])    age 61   Restless leg syndrome    Schatzki's ring    Sleep apnea    does not use CPAP:cannot tolerate, PCP aware   Syncope    Tobacco abuse    50 pack years continuing at one halp pack daily   Tubular adenoma of colon 06/2009   Colonosocpy Dr [name]    Past Surgical History:  Procedure Laterality Date   BIOPSY  12/19/2017   Procedure: BIOPSY;  Surgeon: [name], MD;  [facility];  Service: Endoscopy;;  gastric   CARDIAC CATHETERIZATION     CHOLECYSTECTOMY  2004   COLONOSCOPY  06/2009   normal terminal ileum, segmental mild inflammation of sigmoid colon (bx unremarkable), polyp, tubular adenoma   COLONOSCOPY N/A 07/31/2013   Dr.Rourk- redundant anal canal hemorrhoids, colonic diverticulosis, tubular adenoma   COLONOSCOPY W/ POLYPECTOMY  2004   rectal polyp with carcinoma in situ removed via colonoscopy   COLONOSCOPY WITH PROPOFOL N/A 09/15/2015   Dr. [name]: Scattered diverticula throughout the colon, 2 sessile polyps found in the descending colon and cecum, 5 mm in size.  Cecal polyp was sessile serrated polyp, descending colon polyp was a tubular adenoma.  He had a abnormal perianal exam along with grade 3 hemorrhoids.  Surveillance exam recommended for 5-year follow-up.   COLONOSCOPY WITH PROPOFOL N/A 12/19/2017   one 5 mm polyp in ascending colon and 1 cm sessile polyp in ascending s/p removal. Tubular adenoma. internal hemorrhoids   CORONARY ANGIOPLASTY WITH STENT PLACEMENT  01/26/2012   "1;  total is now 2"   ESOPHAGOGASTRODUODENOSCOPY  02/2009   query Barrett's but bx negative   ESOPHAGOGASTRODUODENOSCOPY  12/30/2010   [name] Estimable Rourk,gastritis, dilated 25F, sm HH, 1 small ulcer, Duodenal erosions, benign bx   ESOPHAGOGASTRODUODENOSCOPY (EGD) WITH ESOPHAGEAL DILATION N/A 09/12/2012   [id number] Schatzki's ring s/p Maloney dilator. Small hiatal hernia. negative path   ESOPHAGOGASTRODUODENOSCOPY (EGD) WITH ESOPHAGEAL DILATION N/A 07/31/2013   Dr. [name]- normal egd, s/p [facility] dilation empirically. Normal small bowel biopsies    ESOPHAGOGASTRODUODENOSCOPY (EGD) WITH PROPOFOL N/A 09/15/2015   Dr. [name]: Medium sized hiatal hernia, normal-appearing esophagus status post empiric dilation   ESOPHAGOGASTRODUODENOSCOPY (EGD) WITH PROPOFOL N/A 12/19/2017   erosive esophagitis s/p dilation, erosive gastropathy, normal duodenum   ESOPHAGOGASTRODUODENOSCOPY (EGD) WITH PROPOFOL  N/A 09/17/2019   Non-obstructing Schatzki's ring s/p dilation, medium-sized hiatal hernia, otherwise normal.   FLEXIBLE SIGMOIDOSCOPY  12/30/2010    [name] Estimable Rourk,; internal hemorrhoids, anal papilla   HAND SURGERY     surgical intervention for injury of the fingers of the left hand many years ago   heart stent     HEMORRHOID BANDING     Dr. [name]   LEFT HEART CATH AND CORONARY ANGIOGRAPHY N/A 08/29/2019   Procedure: LEFT HEART CATH AND CORONARY ANGIOGRAPHY;  Surgeon: [name], MD;  Location: [place] [facility];  Service: Cardiovascular;  Laterality: N/A;   LEFT HEART CATHETERIZATION WITH CORONARY ANGIOGRAM N/A 01/26/2012   Procedure: LEFT HEART CATHETERIZATION WITH CORONARY ANGIOGRAM;  Surgeon: [name], MD;  Location: [facility] CATH LAB;  Service: Cardiovascular;  Laterality: N/A;   MALONEY DILATION N/A 09/15/2015   Procedure: [name] DILATION;  Surgeon: [name], MD;  [facility];  Service: Endoscopy;  Laterality: N/A;   MALONEY DILATION N/A 12/19/2017   Procedure: [name] DILATION;  Surgeon:  [name], MD;  [facility];  Service: Endoscopy;  Laterality: N/A;   MALONEY DILATION N/A 09/17/2019   Procedure: [name] DILATION;  Surgeon: [name], MD;  [facility];  Service: Endoscopy;  Laterality: N/A;   NASAL SEPTOPLASTY W/ TURBINOPLASTY  10/06/2011   Procedure: NASAL SEPTOPLASTY WITH TURBINATE REDUCTION;  Surgeon: Izora Gala, MD;  Location: [facility];  Service: ENT;  Laterality: Bilateral;   PERCUTANEOUS CORONARY STENT INTERVENTION (PCI-S) N/A 01/26/2012   Procedure: PERCUTANEOUS CORONARY STENT INTERVENTION (PCI-S);  Surgeon: [name], MD;  Location: [facility] C[facility] LAB;  Service: Cardiovascular;  Laterality: N/A;   POLYPECTOMY  09/15/2015   Procedure: POLYPECTOMY;  Surgeon: [name], MD;  [facility];  Service: Endoscopy;;  Cecal polyp removed via cold snare/ Descending colon polyp removed via cold snare   POLYPECTOMY  12/19/2017   Procedure: POLYPECTOMY;  Surgeon: [name], MD;  [facility];  Service: Endoscopy;;  colon    Current Outpatient Medications  Medication Sig Dispense Refill   albuterol (PROVENTIL) (2.5 MG/3ML) 0.083% nebulizer solution Take 3 mLs (2.5 mg total) by nebulization every 6 (six) hours as needed for wheezing or shortness of breath. 75 mL 1   albuterol (VENTOLIN HFA) 108 (90 Base) MCG/ACT inhaler Inhale 2 puffs into the lungs every 4 (four) hours as needed for wheezing or shortness of breath. 18 g 1   ALPRAZolam (XANAX) 1 MG tablet TAKE 1 TABLET BY MOUTH THREE TIMES A DAY AS NEEDED FOR ANXIETY 90 tablet 2   amLODipine (NORVASC) 5 MG tablet Take 5 mg by mouth daily.     aspirin EC 81 MG tablet Take 81 mg by mouth daily.     atorvastatin (LIPITOR) 80 MG tablet TAKE 1 TABLET BY MOUTH EVERY DAY 90 tablet 3   budesonide-formoterol (SYMBICORT) 80-4.5 MCG/ACT inhaler Take 2 puffs first thing in am and then another 2 puffs about 12 hours later. 1 Inhaler 12   CREON 36000-114000 units CPEP capsule TAKE 2  CAPSULES BY MOUTH THREE TIMES DAILY WITH MEALS. 1 CAPSULE WITH SNACKS 240 capsule 3   dicyclomine (BENTYL) 10 MG capsule TAKE 1 CAPSULE (10 MG TOTAL) BY MOUTH 4 (FOUR) TIMES DAILY - BEFORE MEALS AND AT BEDTIME. 360 capsule 1   FLUoxetine (PROZAC) 20 MG capsule Take 1 capsule (20 mg total) by mouth daily. 90 capsule 2   FLUoxetine (PROZAC) 40 MG capsule  Take 1 capsule (40 mg total) by mouth daily. Take with 20 mg to equal 60 mg daily 90 capsule 2   fluticasone (FLONASE) 50 MCG/ACT nasal spray Place 2 sprays into both nostrils daily. (Patient taking differently: Place 2 sprays into both nostrils daily as needed for allergies.) 16 g 5   gabapentin (NEURONTIN) 300 MG capsule Take 2 capsules (600 mg total) by mouth 2 (two) times daily. 480 capsule 2   HYDROcodone-acetaminophen (NORCO) 7.5-325 MG tablet Take 1 tablet by mouth 2 (two) times daily as needed for moderate pain. 60 tablet 0   HYDROcodone-acetaminophen (NORCO) 7.5-325 MG tablet Take one tablet by mouth 2 (two) times daily as needed for moderate pain 60 tablet 0   HYDROcodone-acetaminophen (NORCO) 7.5-325 MG tablet Take one tablet po 2 times daily prn for moderate pain 60 tablet 0   hydrocortisone (ANUSOL-HC) 2.5 % rectal cream Place 1 application rectally 2 (two) times daily. 30 g 1   isosorbide mononitrate (IMDUR) 30 MG 24 hr tablet Take 1 tablet (30 mg total) by mouth daily. 90 tablet 3   meloxicam (MOBIC) 15 MG tablet TAKE 1 TABLET BY MOUTH EVERY DAY 30 tablet 5   metoprolol succinate (TOPROL-XL) 50 MG 24 hr tablet TAKE 1 TABLET (50 MG TOTAL) BY MOUTH DAILY. PLEASE SCHEDULE FOLLOW UP APPOINTMENT. THANK YOU 30 tablet 2   naloxone (NARCAN) nasal spray 4 mg/0.1 mL Use as directed 1 each 0   nitroGLYCERIN (NITROSTAT) 0.4 MG SL tablet Place 1 tablet (0.4 mg total) under the tongue every 5 (five) minutes as needed for chest pain. 20 tablet 5   OLANZapine (ZYPREXA) 10 MG tablet Take 1 tablet (10 mg total) by mouth at bedtime. 90 tablet 2    pantoprazole (PROTONIX) 40 MG tablet TAKE 1 TABLET BY MOUTH TWICE A DAY BEFORE A MEAL 180 tablet 3   rOPINIRole (REQUIP) 2 MG tablet TAKE 1 TABLET BY MOUTH EVERYDAY AT BEDTIME 90 tablet 1   telmisartan (MICARDIS) 40 MG tablet Take 1 tablet (40 mg total) by mouth daily. 30 tablet 11   traZODone (DESYREL) 50 MG tablet TAKE 1 TABLET BY MOUTH EVERYDAY AT BEDTIME 90 tablet 1   No current facility-administered medications for this visit.   Allergies:  Patient has no known allergies.   Social History: The patient  reports that he has been smoking cigarettes. He started smoking about 51 years ago. He has a 48.00 pack-year smoking history. He has quit using smokeless tobacco. He reports current alcohol use. He reports that he does not use drugs.   Family History: The patient's family history includes Cancer in his maternal aunt and paternal uncle; Depression in his mother; Heart disease in his mother; Hypertension in his brother; Kidney failure in his maternal uncle; Lung disease in his father.   ROS:  Please see the history of present illness. Otherwise, complete review of systems is positive for none.  All other systems are reviewed and negative.   Physical Exam: VS:  BP 138/82   Pulse 74   Ht 5' 7.5" (1.715 m)   Wt 150 lb 9.6 oz (68.3 kg)   SpO2 97%   BMI 23.24 kg/m , BMI Body mass index is 23.24 kg/m.  Wt Readings from Last 3 Encounters:  01/29/21 150 lb 9.6 oz (68.3 kg)  01/05/21 150 lb 9.6 oz (68.3 kg)  12/17/20 153 lb (69.4 kg)    General: Patient appears comfortable at rest. Neck: Supple, no elevated JVP or carotid bruits, no thyromegaly. Lungs:  Clear to auscultation, nonlabored breathing at rest. Cardiac: Regular rate and rhythm, no S3 or significant systolic murmur, no pericardial rub. Extremities: No pitting edema, distal pulses 2+. Skin: Warm and dry. Musculoskeletal: No kyphosis. Neuropsychiatric: Alert and oriented x3, affect grossly appropriate.  ECG: December 17, 2020 normal  sinus rhythm rate of 81, moderate voltage criteria for LVH, may be normal variant  Recent Labwork: 05/22/2020: Magnesium 2.1 12/18/2020: ALT 25; AST 23; BUN 16; Creatinine, Ser 0.73; Hemoglobin 15.5; Platelets 157; Potassium 4.0; Sodium 138     Component Value Date/Time   CHOL 111 12/18/2020 1047   CHOL 138 12/13/2017 1603   TRIG 83 12/18/2020 1047   HDL 40 (L) 12/18/2020 1047   HDL 34 (L) 12/13/2017 1603   CHOLHDL 2.8 12/18/2020 1047   VLDL 17 12/18/2020 1047   LDLCALC 54 12/18/2020 1047   LDLCALC 43 12/13/2017 1603   LDLDIRECT 51.0 08/29/2019 0236    Other Studies Reviewed Today:  Echocardiogram 12/18/2020 1. LVEF is approximately 55 to 60% wiaht hypokeinsis /akeinsis of the inferolateral wall (base, mid), inferor wall (base, mid). Compared to reprot from echo in 2017, no significant change.. Left ventricular ejection fraction, by estimation, is 55 to 60%. The left ventricle has normal function. Left ventricular diastolic parameters were normal. 2. Right ventricular systolic function is normal. The right ventricular size is normal. 3. The mitral valve is normal in structure. Mild mitral valve regurgitation. 4. The aortic valve is tricuspid. Aortic valve regurgitation is trivial. Mild aortic valve sclerosis is present, with no evidence of aortic valve stenosis. Comparison(s): Echocardiogram done 03/24/16 showed an EF of 50%.   Cardiac Catheterization: 08/2019 Previously placed Prox RCA stent (bare-metal stent) is widely patent. 2nd Mrg lesion is 100% stenosed. Previously documented is 80%. Ost Cx to Prox Cx lesion is 45% stenosed. Prox Cx to Mid Cx lesion is 20% stenosed with 20% stenosed side branch in 1st Mrg. -------------------- The left ventricular ejection fraction is 50-55% by visual estimate. LV end diastolic pressure is normal. There is no aortic valve stenosis. 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    SUMMARY Two-vessel CAD with widely patent RCA stent (at most codominant RCA), now totally occluded small caliber OM 2 branch previously noted to be 80%.  Mild to moderate (45%) ostial LCx disease. Relatively preserved EF of roughly 55% with basal to mid inferior hypokinesis.     RECOMMENDATIONS Optimization of medical management.  The OM branch not likely good PCI target as it is not even visible where it comes off the main circumflex. Anticipate discharge home either today if stable or to  Assessment and Plan:  1. CAD in native artery   2. Chronic obstructive pulmonary disease, unspecified COPD type ([place])   3. Essential hypertension   4. Hyperlipidemia LDL goal <70   5. Mixed hyperlipidemia   6. Tobacco abuse   7. SOB (shortness of breath)     1. CAD in native artery History of CAD.  No classic chest pain, pressure, tightness, radiation or associated symptoms.  Continue aspirin 81 mg daily, continue Imdur 30 mg daily.  Toprol 50 mg daily.  Sublingual nitroglycerin.  2. Chronic obstructive pulmonary disease, unspecified COPD type ([place]) Long history of smoking since age 73.  Patient states at 1 point he was smoking up to 2 packs/day.  He states now he is currently down to 1 pack every 2 days.  He does have albuterol nebulizers and metered-dose inhalers.  He states that his nebulizer machine is broken now.  Please refer to Dr. Elsworth Soho or Dr. Melvyn Novas in [place] for pulmonary work-up given continued shortness of breath and long history of smoking.  3. Essential hypertension Blood pressure reasonably well controlled on current therapy.  138/82 today.  Continue telmisartan 40 mg daily.  Continue Toprol-XL 50 mg daily.   4. Hyperlipidemia LDL goal <70 History of hyperlipidemia.  Continue 80 mg of atorvastatin.  Recent lipid panel on 12/18/2020 demonstrated TC of 111, TG 83, HDL 48, LDL 54.  5. Cigarette smoker Continues to smoke 1 pack every 2 days.  States he is a long-term smoker from age 7  until present.  He states at 1 point he was smoking 2 packs of cigarettes daily.  Advised cessation.    6. SOB (shortness of breath) States he is noticing increasing shortness of breath which appears to be progressing to some degree.  Recent echocardiogram was essentially unchanged from previous echo in 2017.  See report above.  We are referring to Dr. Elsworth Soho or Dr. Melvyn Novas for pulmonary work-up.  Significant history of smoking with continued smoking.  7. Pain of lower extremity, unspecified laterality Complaining of bilateral leg pain worse when walking and relieved at rest.  Recent ABIs were normal  8. Medication management    Medication Adjustments/Labs and Tests Ordered: Current medicines are reviewed at length with the patient today.  Concerns regarding medicines are outlined above.   Disposition: Follow-up with Dr. Harl Bowie or APP 6 to 8 weeks  Signed, Levell July, NP 01/29/2021 8:44 AM    [facility] [place], [place], [place] [address] Phone: [phone]; Fax: [phone]

## 2021-01-29 ENCOUNTER — Encounter: Payer: Self-pay | Admitting: Family Medicine

## 2021-01-29 ENCOUNTER — Ambulatory Visit: Payer: Medicare PPO | Admitting: Family Medicine

## 2021-01-29 ENCOUNTER — Telehealth: Payer: Self-pay | Admitting: Internal Medicine

## 2021-01-29 VITALS — BP 138/82 | HR 74 | Ht 67.5 in | Wt 150.6 lb

## 2021-01-29 DIAGNOSIS — E782 Mixed hyperlipidemia: Secondary | ICD-10-CM | POA: Diagnosis not present

## 2021-01-29 DIAGNOSIS — J449 Chronic obstructive pulmonary disease, unspecified: Secondary | ICD-10-CM

## 2021-01-29 DIAGNOSIS — E785 Hyperlipidemia, unspecified: Secondary | ICD-10-CM

## 2021-01-29 DIAGNOSIS — R0602 Shortness of breath: Secondary | ICD-10-CM | POA: Diagnosis not present

## 2021-01-29 DIAGNOSIS — Z720 Tobacco use: Secondary | ICD-10-CM

## 2021-01-29 DIAGNOSIS — I2510 Atherosclerotic heart disease of native coronary artery without angina pectoris: Secondary | ICD-10-CM

## 2021-01-29 DIAGNOSIS — I10 Essential (primary) hypertension: Secondary | ICD-10-CM

## 2021-01-29 DIAGNOSIS — G4733 Obstructive sleep apnea (adult) (pediatric): Secondary | ICD-10-CM

## 2021-01-29 NOTE — Telephone Encounter (Signed)
Noted.   Awaiting f/u from [facility].

## 2021-01-29 NOTE — Patient Instructions (Addendum)
Medication Instructions:  Your physician recommends that you continue on your current medications as directed. Please refer to the Current Medication list given to you today.  Labwork: none  Testing/Procedures: none  Follow-Up: Your physician recommends that you schedule a follow-up appointment in: 6 months with Dr. Harl Bowie  Any Other Special Instructions Will Be Listed Below (If Applicable). You have been referred to [facility] Pulmonology  If you need a refill on your cardiac medications before your next appointment, please call your pharmacy.

## 2021-01-29 NOTE — Telephone Encounter (Signed)
Fine with me for Dr [name] to do both if he is willing

## 2021-01-29 NOTE — Telephone Encounter (Signed)
Called and spoke with patient's wife Gregary Signs per DPR who states that patient was seen by Dr. Melvyn Novas about a year ago and is needing to be seen for sleep consult and is wondering if patient can see Dr. [name] for COPD and sleep.   Dr. Melvyn Novas are you ok with this?  Dr. [name] are you ok with seeing patient for both?

## 2021-01-30 NOTE — Telephone Encounter (Signed)
Call made to patient, made aware [facility] can treat for both at upcoming appt. Voiced understanding.   Nothing further needed at this time.

## 2021-01-30 NOTE — Telephone Encounter (Signed)
Fine with me also.

## 2021-02-03 ENCOUNTER — Other Ambulatory Visit: Payer: Self-pay

## 2021-02-03 ENCOUNTER — Other Ambulatory Visit (INDEPENDENT_AMBULATORY_CARE_PROVIDER_SITE_OTHER): Payer: Medicare PPO

## 2021-02-03 DIAGNOSIS — E538 Deficiency of other specified B group vitamins: Secondary | ICD-10-CM

## 2021-02-03 MED ORDER — CYANOCOBALAMIN 1000 MCG/ML IJ SOLN
1000.0000 ug | Freq: Once | INTRAMUSCULAR | Status: AC
  Administered 2021-02-03: 1000 ug via INTRAMUSCULAR

## 2021-02-10 ENCOUNTER — Other Ambulatory Visit (INDEPENDENT_AMBULATORY_CARE_PROVIDER_SITE_OTHER): Payer: Medicare PPO | Admitting: *Deleted

## 2021-02-10 ENCOUNTER — Other Ambulatory Visit: Payer: Self-pay

## 2021-02-10 DIAGNOSIS — E538 Deficiency of other specified B group vitamins: Secondary | ICD-10-CM

## 2021-02-10 MED ORDER — CYANOCOBALAMIN 1000 MCG/ML IJ SOLN
1000.0000 ug | Freq: Once | INTRAMUSCULAR | Status: AC
  Administered 2021-02-10: 1000 ug via INTRAMUSCULAR

## 2021-02-17 ENCOUNTER — Other Ambulatory Visit (HOSPITAL_COMMUNITY): Payer: Self-pay | Admitting: Psychiatry

## 2021-02-17 NOTE — Telephone Encounter (Signed)
Call for appt

## 2021-02-27 ENCOUNTER — Encounter: Payer: Self-pay | Admitting: Internal Medicine

## 2021-02-27 ENCOUNTER — Telehealth: Payer: Self-pay

## 2021-02-27 ENCOUNTER — Other Ambulatory Visit: Payer: Self-pay

## 2021-02-27 ENCOUNTER — Ambulatory Visit: Payer: Medicare PPO | Admitting: Internal Medicine

## 2021-02-27 VITALS — BP 136/87 | HR 68 | Temp 96.9°F | Ht 70.0 in | Wt 151.4 lb

## 2021-02-27 DIAGNOSIS — K219 Gastro-esophageal reflux disease without esophagitis: Secondary | ICD-10-CM

## 2021-02-27 DIAGNOSIS — R1310 Dysphagia, unspecified: Secondary | ICD-10-CM | POA: Diagnosis not present

## 2021-02-27 DIAGNOSIS — R634 Abnormal weight loss: Secondary | ICD-10-CM | POA: Diagnosis not present

## 2021-02-27 DIAGNOSIS — R109 Unspecified abdominal pain: Secondary | ICD-10-CM

## 2021-02-27 MED ORDER — PEG 3350-KCL-NA BICARB-NACL 420 G PO SOLR: 4000.0000 mL | ORAL | 0 refills | Status: DC

## 2021-02-27 NOTE — Progress Notes (Signed)
B12 to his regimen recently   Primary Care Physician:  [name], MD Primary Gastroenterologist:  Dr. [name]  Pre-Procedure History & Physical: HPI:  [name] is a 65 y.o. male here for further evaluation of esophageal dysphagia and continued weight loss.  Patient has lost another 15 pounds since he was seen a year ago.  He was started on pancreatic enzymes empirically which initially helped but does not really make much difference these days.  He has no more than 2 bowel movements daily on average.  Sometimes he has a fair amount of rectal bleeding -   Covering the stool and coloring the toilet water.  Occasionally, he is awakened with the urge to have a bowel movement in the middle of the night.  Denies greasy stools.  History of abdominal pain  - has subsided somewhat.  Not afraid to eat.  He just does not not have much of an appetite;  eats about 2 times daily.  History of Schatzki's ring dilated previously with good results but did not last.  Recurrent esophageal dysphagia.  BPE ordered at last visit but patient does not recall being sent for that study.  No recent CT of the abdomen.  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  Due for surveillance 2024.  Patient states reflux symptoms well controlled on Protonix 40 mg twice daily.  Patient tells me Dr. [name] added B12 supplementation to his regimen recently.  History of symptomatic hemorrhoids for which he has been banded in the past with good results.  Patient denies dwelling on the toilet  -  takes about 5 minutes to have a bowel movement.  Denies straining.  Past Medical History:  Diagnosis Date   Anxiety    Anxiety and depression    ASCVD (arteriosclerotic cardiovascular disease)    a. s/p PTCA to LCx in 1993 b. BMS to RCA in 01/2012 with residual 80% OM2 stenosis and medical management recommended c. 08/2019: cath showing patent stents with  occluded OM2 --> medical management.    Asthma    CAD (coronary artery disease)    Carcinoma in situ of colon 2004   rectal polyp   Colitis, ischemic ([name]) 2011   COPD (chronic obstructive pulmonary disease) (HCC)    mild ;excercise induced hypoxemia by cp stress test ;asthma ,bronchitis,   Depression    Diverticulosis    Gastritis 12/30/10   EGD Dr [name]   GERD (gastroesophageal reflux disease)    GI bleed    H. 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)    Leukocytosis    [name]   Lung nodule 07/16/2011   Myocardial infarction [facility])    age 26   Restless leg syndrome    Schatzki's ring    Sleep apnea    does not use CPAP:cannot tolerate, PCP aware   Syncope    Tobacco abuse    50 pack years continuing at one halp pack daily   Tubular adenoma of colon 06/2009   Colonosocpy Dr [name]    Past Surgical History:  Procedure Laterality Date   BIOPSY  12/19/2017   Procedure: BIOPSY;  Surgeon: [name], MD;  [facility];  Service: Endoscopy;;  gastric  CARDIAC CATHETERIZATION     CHOLECYSTECTOMY  2004   COLONOSCOPY  06/2009   normal terminal ileum, segmental mild inflammation of sigmoid colon (bx unremarkable), polyp, tubular adenoma   COLONOSCOPY N/A 07/31/2013   Dr.[name]- redundant anal canal hemorrhoids, colonic diverticulosis, tubular adenoma   COLONOSCOPY W/ POLYPECTOMY  2004   rectal polyp with carcinoma in situ removed via colonoscopy   COLONOSCOPY WITH PROPOFOL N/A 09/15/2015   Dr. [name]: Scattered diverticula throughout the colon, 2 sessile polyps found in the descending colon and cecum, 5 mm in size.  Cecal polyp was sessile serrated polyp, descending colon polyp was a tubular adenoma.  He had a abnormal perianal exam along with grade 3 hemorrhoids.  Surveillance exam recommended for 5-year follow-up.   COLONOSCOPY WITH PROPOFOL N/A  12/19/2017   one 5 mm polyp in ascending colon and 1 cm sessile polyp in ascending s/p removal. Tubular adenoma. internal hemorrhoids   CORONARY ANGIOPLASTY WITH STENT PLACEMENT  01/26/2012   "1; total is now 2"   ESOPHAGOGASTRODUODENOSCOPY  02/2009   query Barrett's but bx negative   ESOPHAGOGASTRODUODENOSCOPY  12/30/2010   [name],gastritis, dilated 65F, sm HH, 1 small ulcer, Duodenal erosions, benign bx   ESOPHAGOGASTRODUODENOSCOPY (EGD) WITH ESOPHAGEAL DILATION N/A 09/12/2012   [id number] Schatzki's ring s/p Maloney dilator. Small hiatal hernia. negative path   ESOPHAGOGASTRODUODENOSCOPY (EGD) WITH ESOPHAGEAL DILATION N/A 07/31/2013   Dr. [name]- normal egd, s/p [facility] dilation empirically. Normal small bowel biopsies    ESOPHAGOGASTRODUODENOSCOPY (EGD) WITH PROPOFOL N/A 09/15/2015   Dr. [name]: Medium sized hiatal hernia, normal-appearing esophagus status post empiric dilation   ESOPHAGOGASTRODUODENOSCOPY (EGD) WITH PROPOFOL N/A 12/19/2017   erosive esophagitis s/p dilation, erosive gastropathy, normal duodenum   ESOPHAGOGASTRODUODENOSCOPY (EGD) WITH PROPOFOL N/A 09/17/2019   Non-obstructing Schatzki's ring s/p dilation, medium-sized hiatal hernia, otherwise normal.   FLEXIBLE SIGMOIDOSCOPY  12/30/2010    [name] [name],; internal hemorrhoids, anal papilla   HAND SURGERY     surgical intervention for injury of the fingers of the left hand many years ago   heart stent     HEMORRHOID BANDING     Dr. [name]   LEFT HEART CATH AND CORONARY ANGIOGRAPHY N/A 08/29/2019   Procedure: LEFT HEART CATH AND CORONARY ANGIOGRAPHY;  Surgeon: [name], MD;  Location: [place] [facility];  Service: Cardiovascular;  Laterality: N/A;   LEFT HEART CATHETERIZATION WITH CORONARY ANGIOGRAM N/A 01/26/2012   Procedure: LEFT HEART CATHETERIZATION WITH CORONARY ANGIOGRAM;  Surgeon: [name], MD;  Location: [facility] CATH LAB;  Service: Cardiovascular;  Laterality: N/A;   MALONEY DILATION N/A 09/15/2015    Procedure: [name] DILATION;  Surgeon: [name], MD;  [facility];  Service: Endoscopy;  Laterality: N/A;   MALONEY DILATION N/A 12/19/2017   Procedure: [name] DILATION;  Surgeon: [name], MD;  [facility];  Service: Endoscopy;  Laterality: N/A;   MALONEY DILATION N/A 09/17/2019   Procedure: [name] DILATION;  Surgeon: [name], MD;  [facility];  Service: Endoscopy;  Laterality: N/A;   NASAL SEPTOPLASTY W/ TURBINOPLASTY  10/06/2011   Procedure: NASAL SEPTOPLASTY WITH TURBINATE REDUCTION;  Surgeon: Izora Gala, MD;  Location: [place];  Service: ENT;  Laterality: Bilateral;   PERCUTANEOUS CORONARY STENT INTERVENTION (PCI-S) N/A 01/26/2012   Procedure: PERCUTANEOUS CORONARY STENT INTERVENTION (PCI-S);  Surgeon: [name], MD;  Location: [facility] CATH LAB;  Service: Cardiovascular;  Laterality: N/A;   POLYPECTOMY  09/15/2015   Procedure: POLYPECTOMY;  Surgeon: [name]  Jerell Demery, MD;  [facility];  Service: Endoscopy;;  Cecal polyp removed via cold snare/ Descending colon polyp removed via cold snare   POLYPECTOMY  12/19/2017   Procedure: POLYPECTOMY;  Surgeon: [name], MD;  [facility];  Service: Endoscopy;;  colon    Prior to Admission medications   Medication Sig Start Date End Date Taking? Authorizing Provider  albuterol (PROVENTIL) (2.5 MG/3ML) 0.083% nebulizer solution Take 3 mLs (2.5 mg total) by nebulization every 6 (six) hours as needed for wheezing or shortness of breath. 09/13/19  Yes Strader, Fransisco Hertz, PA-C  albuterol (VENTOLIN HFA) 108 (90 Base) MCG/ACT inhaler Inhale 2 puffs into the lungs every 4 (four) hours as needed for wheezing or shortness of breath. 10/30/19  Yes Tanda Rockers, MD  ALPRAZolam Duanne Moron) 1 MG tablet TAKE 1 TABLET BY MOUTH THREE TIMES A DAY AS NEEDED FOR ANXIETY 02/17/21  Yes Cloria Spring, MD  amLODipine (NORVASC) 5 MG tablet Take 5 mg by mouth daily.   Yes [provider]  aspirin  EC 81 MG tablet Take 81 mg by mouth daily.   Yes [provider]  atorvastatin (LIPITOR) 80 MG tablet TAKE 1 TABLET BY MOUTH EVERY DAY 06/16/20  Yes Branch, Alphonse Guild, MD  budesonide-formoterol [facility]) 80-4.5 MCG/ACT inhaler Take 2 puffs first thing in am and then another 2 puffs about 12 hours later. 10/30/19  Yes Tanda Rockers, MD  CREON [phone] units CPEP capsule TAKE 2 CAPSULES BY MOUTH THREE TIMES DAILY WITH MEALS. 1 CAPSULE WITH SNACKS 11/04/20  Yes Annitta Needs, NP  dicyclomine (BENTYL) 10 MG capsule TAKE 1 CAPSULE (10 MG TOTAL) BY MOUTH 4 (FOUR) TIMES DAILY - BEFORE MEALS AND AT BEDTIME. 11/15/19  Yes Annitta Needs, NP  FLUoxetine (PROZAC) 20 MG capsule Take 1 capsule (20 mg total) by mouth daily. 10/07/20 10/07/21 Yes Cloria Spring, MD  FLUoxetine (PROZAC) 40 MG capsule Take 1 capsule (40 mg total) by mouth daily. Take with 20 mg to equal 60 mg daily 10/07/20  Yes Cloria Spring, MD  fluticasone [facility]) 50 MCG/ACT nasal spray Place 2 sprays into both nostrils daily. Patient taking differently: Place 2 sprays into both nostrils daily as needed for allergies. 10/19/16  Yes [name], MD  gabapentin (NEURONTIN) 300 MG capsule Take 2 capsules (600 mg total) by mouth 2 (two) times daily. 10/07/20  Yes Cloria Spring, MD  HYDROcodone-acetaminophen (NORCO) 7.5-325 MG tablet Take 1 tablet by mouth 2 (two) times daily as needed for moderate pain. 04/17/20  Yes [name], MD  HYDROcodone-acetaminophen (NORCO) 7.5-325 MG tablet Take one tablet by mouth 2 (two) times daily as needed for moderate pain 01/05/21  Yes [name], MD  HYDROcodone-acetaminophen (NORCO) 7.5-325 MG tablet Take one tablet po 2 times daily prn for moderate pain 01/05/21  Yes Luking, Scott A, MD  hydrocortisone (ANUSOL-HC) 2.5 % rectal cream Place 1 application rectally 2 (two) times daily. Patient taking differently: Place 1 application rectally 2 (two) times daily. As needed 05/04/19  Yes Annitta Needs, NP  isosorbide mononitrate (IMDUR) 30 MG 24 hr tablet Take 1 tablet (30 mg total) by mouth daily. 10/06/20 10/06/21 Yes Luking, Elayne Snare, MD  meloxicam (MOBIC) 15 MG tablet TAKE 1 TABLET BY MOUTH EVERY DAY 08/21/20  Yes Luking, Scott A, MD  metoprolol succinate (TOPROL-XL) 50 MG 24 hr tablet TAKE 1 TABLET (50 MG TOTAL) BY MOUTH DAILY. PLEASE SCHEDULE FOLLOW UP  APPOINTMENT. Gideon YOU 12/08/20  Yes Arnoldo Lenis, MD  naloxone [facility]) nasal spray 4 mg/0.1 mL Use as directed 01/21/20  Yes Luking, Elayne Snare, MD  nitroGLYCERIN (NITROSTAT) 0.4 MG SL tablet Place 1 tablet (0.4 mg total) under the tongue every 5 (five) minutes as needed for chest pain. 10/06/20  Yes Luking, Elayne Snare, MD  OLANZapine (ZYPREXA) 10 MG tablet Take 1 tablet (10 mg total) by mouth at bedtime. 10/07/20 10/07/21 Yes Cloria Spring, MD  pantoprazole (PROTONIX) 40 MG tablet TAKE 1 TABLET BY MOUTH TWICE A DAY BEFORE A MEAL 07/03/20  Yes Mahala Menghini, PA-C  rOPINIRole (REQUIP) 2 MG tablet TAKE 1 TABLET BY MOUTH EVERYDAY AT BEDTIME 10/06/20  Yes Luking, Scott A, MD  telmisartan (MICARDIS) 40 MG tablet Take 1 tablet (40 mg total) by mouth daily. 10/30/19  Yes Tanda Rockers, MD  traZODone (DESYREL) 50 MG tablet TAKE 1 TABLET BY MOUTH EVERYDAY AT BEDTIME 10/29/20  Yes Cloria Spring, MD  vitamin B-12 (CYANOCOBALAMIN) 1000 MCG tablet Take 2,000 mcg by mouth daily.   Yes [provider]    Allergies as of 02/27/2021   (No Known Allergies)    Family History  Problem Relation Age of Onset   Lung disease Father        deceased, black lung   Heart disease Mother        blood clots   Depression Mother    Cancer Paternal Uncle        unknown type   Cancer Maternal Aunt        unknown type   Kidney failure Maternal Uncle    Hypertension Brother    Colon cancer Neg Hx    ADD / ADHD Neg Hx    Alcohol abuse Neg Hx    Drug abuse Neg Hx    Anxiety disorder Neg Hx    Bipolar disorder Neg Hx    Dementia Neg Hx    OCD Neg Hx     Paranoid behavior Neg Hx    Schizophrenia Neg Hx    Physical abuse Neg Hx    Sexual abuse Neg Hx    Seizures Neg Hx     Social History   Socioeconomic History   Marital status: Married    Spouse name: Not on file   Number of children: 3   Years of education: Not on file   Highest education level: Not on file  Occupational History   Occupation: disable    Employer: RETIRED    Comment: [employer]  Tobacco Use   Smoking status: Every Day    Packs/day: 1.00    Years: 48.00    Pack years: 48.00    Types: Cigarettes    Start date: 07/31/1969   Smokeless tobacco: Former  Scientific laboratory technician Use: Never used  Substance and Sexual Activity   Alcohol use: Yes    Comment: Drinks a beer occasionally   Drug use: No   Sexual activity: Not Currently  Other Topics Concern   Not on file  Social History Narrative   3 stepchildren   Social Determinants of Health   Financial Resource Strain: Not on file  Food Insecurity: Not on file  Transportation Needs: Not on file  Physical Activity: Not on file  Stress: Not on file  Social Connections: Not on file  Intimate Partner Violence: Not on file    Review of Systems: See HPI, otherwise negative ROS  Physical Exam: BP 136/87  Pulse 68   Temp (!) 96.9 F (36.1 C)   Ht '5\' 10"'$  (1.778 m)   Wt 151 lb 6.4 oz (68.7 kg)   BMI 21.72 kg/m  General:   Alert,  pleasant and cooperative in NAD Neck:  Supple; no masses or thyromegaly. No significant cervical adenopathy. Lungs:  Clear throughout to auscultation.   No wheezes, crackles, or rhonchi. No acute distress. Heart:  Regular rate and rhythm; no murmurs, clicks, rubs,  or gallops. Abdomen: Non-distended, normal bowel sounds.  Soft and nontender without appreciable mass or hepatosplenomegaly.  Pulses:  Normal pulses noted. Extremities:  Without clubbing or edema.  Impression/Plan: 65 year old gentleman with multiple issues including some anorexia, weight loss, recurrent esophageal dysphagia,  rectal bleeding.  Nocturnal/loose stools.  Needs further evaluation.  At this time, he does need to have cross-sectional imaging to get a good look at his pancreas, etc.  In addition, I feel we ought to go directly to repeat EGD to reassess his upper GI tract and perform esophageal dilation as feasible/appropriate.  Moreover, he needs an updated colonoscopy given his symptoms and history.   Recommendations:  As discussed we will proceed with a CT of the abdomen and pelvis/pancreatic protocol to further evaluate weight loss and history of abdominal pain  We will go ahead and schedule an EGD with esophageal dilation-esophageal dysphagia and a diagnostic colonoscopy for rectal bleeding (ASA 3/propofol) after CT has been completed  No change in medications for now  Further recommendations to follow.     Notice: This dictation was prepared with Dragon dictation along with smaller phrase technology. Any transcriptional errors that result from this process are unintentional and may not be corrected upon review.

## 2021-02-27 NOTE — Progress Notes (Unsigned)
Tab

## 2021-02-27 NOTE — Patient Instructions (Addendum)
It was good seeing you again today!  As discussed we will proceed with a CT of the abdomen and pelvis/pancreatic protocol to further evaluate weight loss and history of abdominal pain  We will go ahead and schedule an EGD with esophageal dilation-esophageal dysphagia and a diagnostic colonoscopy for rectal bleeding (ASA 3/propofol) after CT has been completed  No change in your medications for now  Further recommendations to follow.

## 2021-02-27 NOTE — Telephone Encounter (Signed)
PA for TCS/EGD/DIL submitted via [employer] website. [employer]# [id number], valid 04/09/21-05/09/21.

## 2021-03-03 ENCOUNTER — Telehealth: Payer: Self-pay

## 2021-03-03 NOTE — Telephone Encounter (Signed)
PA for CT abd/pelvis w/o contrast submitted via [employer] website. [employer]# [id number], valid 03/26/21-04/25/21.

## 2021-03-09 ENCOUNTER — Other Ambulatory Visit: Payer: Self-pay | Admitting: Family Medicine

## 2021-03-10 ENCOUNTER — Encounter (HOSPITAL_COMMUNITY): Payer: Self-pay

## 2021-03-10 ENCOUNTER — Emergency Department (HOSPITAL_COMMUNITY): Payer: Medicare PPO

## 2021-03-10 ENCOUNTER — Encounter (HOSPITAL_COMMUNITY)
Admission: EM | Disposition: A | Discharge status: Home / Self Care | Payer: Self-pay | Source: Home / Self Care | Attending: Thoracic Surgery (Cardiothoracic Vascular Surgery)

## 2021-03-10 ENCOUNTER — Inpatient Hospital Stay (HOSPITAL_COMMUNITY)
Admission: EM | Admit: 2021-03-10 | Discharge: 2021-03-19 | DRG: 234 | Disposition: A | Discharge status: Home / Self Care | Payer: Medicare PPO | Attending: Thoracic Surgery (Cardiothoracic Vascular Surgery) | Admitting: Thoracic Surgery (Cardiothoracic Vascular Surgery)

## 2021-03-10 ENCOUNTER — Other Ambulatory Visit: Payer: Self-pay

## 2021-03-10 DIAGNOSIS — Z86004 Personal history of in-situ neoplasm of other and unspecified digestive organs: Secondary | ICD-10-CM

## 2021-03-10 DIAGNOSIS — K5730 Diverticulosis of large intestine without perforation or abscess without bleeding: Secondary | ICD-10-CM | POA: Diagnosis present

## 2021-03-10 DIAGNOSIS — I110 Hypertensive heart disease with heart failure: Secondary | ICD-10-CM | POA: Diagnosis present

## 2021-03-10 DIAGNOSIS — I472 Ventricular tachycardia, unspecified: Secondary | ICD-10-CM | POA: Diagnosis not present

## 2021-03-10 DIAGNOSIS — I25119 Atherosclerotic heart disease of native coronary artery with unspecified angina pectoris: Secondary | ICD-10-CM | POA: Diagnosis not present

## 2021-03-10 DIAGNOSIS — I249 Acute ischemic heart disease, unspecified: Secondary | ICD-10-CM | POA: Diagnosis present

## 2021-03-10 DIAGNOSIS — Z8249 Family history of ischemic heart disease and other diseases of the circulatory system: Secondary | ICD-10-CM

## 2021-03-10 DIAGNOSIS — R0902 Hypoxemia: Secondary | ICD-10-CM | POA: Diagnosis not present

## 2021-03-10 DIAGNOSIS — R339 Retention of urine, unspecified: Secondary | ICD-10-CM | POA: Diagnosis not present

## 2021-03-10 DIAGNOSIS — F32A Depression, unspecified: Secondary | ICD-10-CM | POA: Diagnosis present

## 2021-03-10 DIAGNOSIS — E7800 Pure hypercholesterolemia, unspecified: Secondary | ICD-10-CM | POA: Diagnosis present

## 2021-03-10 DIAGNOSIS — G9340 Encephalopathy, unspecified: Secondary | ICD-10-CM | POA: Diagnosis not present

## 2021-03-10 DIAGNOSIS — Z01810 Encounter for preprocedural cardiovascular examination: Secondary | ICD-10-CM | POA: Diagnosis not present

## 2021-03-10 DIAGNOSIS — T82855A Stenosis of coronary artery stent, initial encounter: Secondary | ICD-10-CM | POA: Diagnosis present

## 2021-03-10 DIAGNOSIS — J811 Chronic pulmonary edema: Secondary | ICD-10-CM | POA: Diagnosis not present

## 2021-03-10 DIAGNOSIS — K219 Gastro-esophageal reflux disease without esophagitis: Secondary | ICD-10-CM | POA: Diagnosis present

## 2021-03-10 DIAGNOSIS — R29818 Other symptoms and signs involving the nervous system: Secondary | ICD-10-CM | POA: Diagnosis not present

## 2021-03-10 DIAGNOSIS — Z7982 Long term (current) use of aspirin: Secondary | ICD-10-CM | POA: Diagnosis not present

## 2021-03-10 DIAGNOSIS — R42 Dizziness and giddiness: Secondary | ICD-10-CM | POA: Diagnosis not present

## 2021-03-10 DIAGNOSIS — Y831 Surgical operation with implant of artificial internal device as the cause of abnormal reaction of the patient, or of later complication, without mention of misadventure at the time of the procedure: Secondary | ICD-10-CM | POA: Diagnosis present

## 2021-03-10 DIAGNOSIS — I25110 Atherosclerotic heart disease of native coronary artery with unstable angina pectoris: Secondary | ICD-10-CM | POA: Diagnosis not present

## 2021-03-10 DIAGNOSIS — Z20822 Contact with and (suspected) exposure to covid-19: Secondary | ICD-10-CM | POA: Diagnosis present

## 2021-03-10 DIAGNOSIS — R4689 Other symptoms and signs involving appearance and behavior: Secondary | ICD-10-CM | POA: Diagnosis not present

## 2021-03-10 DIAGNOSIS — G4733 Obstructive sleep apnea (adult) (pediatric): Secondary | ICD-10-CM | POA: Diagnosis present

## 2021-03-10 DIAGNOSIS — I252 Old myocardial infarction: Secondary | ICD-10-CM

## 2021-03-10 DIAGNOSIS — Z79899 Other long term (current) drug therapy: Secondary | ICD-10-CM

## 2021-03-10 DIAGNOSIS — Z720 Tobacco use: Secondary | ICD-10-CM | POA: Diagnosis not present

## 2021-03-10 DIAGNOSIS — Z951 Presence of aortocoronary bypass graft: Secondary | ICD-10-CM

## 2021-03-10 DIAGNOSIS — I10 Essential (primary) hypertension: Secondary | ICD-10-CM | POA: Diagnosis not present

## 2021-03-10 DIAGNOSIS — R4182 Altered mental status, unspecified: Secondary | ICD-10-CM | POA: Diagnosis not present

## 2021-03-10 DIAGNOSIS — Z8719 Personal history of other diseases of the digestive system: Secondary | ICD-10-CM

## 2021-03-10 DIAGNOSIS — Z0189 Encounter for other specified special examinations: Secondary | ICD-10-CM

## 2021-03-10 DIAGNOSIS — Z818 Family history of other mental and behavioral disorders: Secondary | ICD-10-CM

## 2021-03-10 DIAGNOSIS — E8770 Fluid overload, unspecified: Secondary | ICD-10-CM | POA: Diagnosis not present

## 2021-03-10 DIAGNOSIS — R079 Chest pain, unspecified: Secondary | ICD-10-CM | POA: Diagnosis not present

## 2021-03-10 DIAGNOSIS — I088 Other rheumatic multiple valve diseases: Secondary | ICD-10-CM | POA: Diagnosis not present

## 2021-03-10 DIAGNOSIS — I2510 Atherosclerotic heart disease of native coronary artery without angina pectoris: Secondary | ICD-10-CM | POA: Diagnosis present

## 2021-03-10 DIAGNOSIS — R0789 Other chest pain: Secondary | ICD-10-CM | POA: Diagnosis not present

## 2021-03-10 DIAGNOSIS — E876 Hypokalemia: Secondary | ICD-10-CM | POA: Diagnosis not present

## 2021-03-10 DIAGNOSIS — Z781 Physical restraint status: Secondary | ICD-10-CM

## 2021-03-10 DIAGNOSIS — F17210 Nicotine dependence, cigarettes, uncomplicated: Secondary | ICD-10-CM | POA: Diagnosis present

## 2021-03-10 DIAGNOSIS — E785 Hyperlipidemia, unspecified: Secondary | ICD-10-CM | POA: Diagnosis not present

## 2021-03-10 DIAGNOSIS — I2121 ST elevation (STEMI) myocardial infarction involving left circumflex coronary artery: Secondary | ICD-10-CM

## 2021-03-10 DIAGNOSIS — R569 Unspecified convulsions: Secondary | ICD-10-CM | POA: Diagnosis not present

## 2021-03-10 DIAGNOSIS — I6523 Occlusion and stenosis of bilateral carotid arteries: Secondary | ICD-10-CM | POA: Diagnosis not present

## 2021-03-10 DIAGNOSIS — I213 ST elevation (STEMI) myocardial infarction of unspecified site: Secondary | ICD-10-CM | POA: Diagnosis present

## 2021-03-10 DIAGNOSIS — R4189 Other symptoms and signs involving cognitive functions and awareness: Secondary | ICD-10-CM | POA: Diagnosis not present

## 2021-03-10 DIAGNOSIS — F419 Anxiety disorder, unspecified: Secondary | ICD-10-CM | POA: Diagnosis present

## 2021-03-10 DIAGNOSIS — Z791 Long term (current) use of non-steroidal anti-inflammatories (NSAID): Secondary | ICD-10-CM | POA: Diagnosis not present

## 2021-03-10 DIAGNOSIS — G2581 Restless legs syndrome: Secondary | ICD-10-CM | POA: Diagnosis present

## 2021-03-10 DIAGNOSIS — I5043 Acute on chronic combined systolic (congestive) and diastolic (congestive) heart failure: Secondary | ICD-10-CM | POA: Diagnosis present

## 2021-03-10 DIAGNOSIS — R918 Other nonspecific abnormal finding of lung field: Secondary | ICD-10-CM | POA: Diagnosis not present

## 2021-03-10 DIAGNOSIS — J449 Chronic obstructive pulmonary disease, unspecified: Secondary | ICD-10-CM | POA: Diagnosis present

## 2021-03-10 DIAGNOSIS — I248 Other forms of acute ischemic heart disease: Secondary | ICD-10-CM | POA: Diagnosis not present

## 2021-03-10 DIAGNOSIS — I517 Cardiomegaly: Secondary | ICD-10-CM | POA: Diagnosis not present

## 2021-03-10 DIAGNOSIS — Z09 Encounter for follow-up examination after completed treatment for conditions other than malignant neoplasm: Secondary | ICD-10-CM

## 2021-03-10 DIAGNOSIS — I219 Acute myocardial infarction, unspecified: Secondary | ICD-10-CM | POA: Diagnosis not present

## 2021-03-10 DIAGNOSIS — I214 Non-ST elevation (NSTEMI) myocardial infarction: Secondary | ICD-10-CM | POA: Diagnosis not present

## 2021-03-10 DIAGNOSIS — Z7951 Long term (current) use of inhaled steroids: Secondary | ICD-10-CM

## 2021-03-10 DIAGNOSIS — R0602 Shortness of breath: Secondary | ICD-10-CM | POA: Diagnosis not present

## 2021-03-10 DIAGNOSIS — I6503 Occlusion and stenosis of bilateral vertebral arteries: Secondary | ICD-10-CM | POA: Diagnosis not present

## 2021-03-10 HISTORY — PX: LEFT HEART CATH AND CORONARY ANGIOGRAPHY: CATH118249

## 2021-03-10 HISTORY — PX: CORONARY/GRAFT ACUTE MI REVASCULARIZATION: CATH118305

## 2021-03-10 LAB — BASIC METABOLIC PANEL
Anion gap: 8 (ref 5–15)
BUN: 10 mg/dL (ref 8–23)
CO2: 28 mmol/L (ref 22–32)
Calcium: 8.9 mg/dL (ref 8.9–10.3)
Chloride: 103 mmol/L (ref 98–111)
Creatinine, Ser: 0.83 mg/dL (ref 0.61–1.24)
GFR, Estimated: >60 mL/min (ref >60)
Glucose, Bld: 115 mg/dL — ABNORMAL HIGH (ref 70–99)
Potassium: 3.7 mmol/L (ref 3.5–5.1)
Sodium: 139 mmol/L (ref 135–145)

## 2021-03-10 LAB — RESP PANEL BY RT-PCR (FLU A&B, COVID) ARPGX2
Influenza A by PCR: NEGATIVE
Influenza B by PCR: NEGATIVE
SARS Coronavirus 2 by RT PCR: NEGATIVE

## 2021-03-10 LAB — CBC
HCT: 43.2 % (ref 39.0–52.0)
Hemoglobin: 15.4 g/dL (ref 13.0–17.0)
MCH: 33.9 pg (ref 26.0–34.0)
MCHC: 35.6 g/dL (ref 30.0–36.0)
MCV: 95.2 fL (ref 80.0–100.0)
Platelets: 142 10*3/uL — ABNORMAL LOW (ref 150–400)
RBC: 4.54 MIL/uL (ref 4.22–5.81)
RDW: 12.9 % (ref 11.5–15.5)
WBC: 8.9 10*3/uL (ref 4.0–10.5)
nRBC: 0 % (ref 0.0–0.2)

## 2021-03-10 LAB — TROPONIN I (HIGH SENSITIVITY)
Troponin I (High Sensitivity): 3242 ng/L (ref <18)
Troponin I (High Sensitivity): 2422 ng/L (ref <18)

## 2021-03-10 SURGERY — CORONARY/GRAFT ACUTE MI REVASCULARIZATION
Anesthesia: LOCAL

## 2021-03-10 MED ORDER — NITROGLYCERIN 0.4 MG SL SUBL
0.4000 mg | SUBLINGUAL_TABLET | SUBLINGUAL | Status: DC | PRN
  Administered 2021-03-10 (×2): 0.4 mg via SUBLINGUAL
  Filled 2021-03-10: qty 1

## 2021-03-10 MED ORDER — NITROGLYCERIN IN D5W 200-5 MCG/ML-% IV SOLN
5.0000 ug/min | INTRAVENOUS | Status: DC
  Administered 2021-03-10: 5 ug/min via INTRAVENOUS
  Filled 2021-03-10: qty 250

## 2021-03-10 MED ORDER — HEPARIN (PORCINE) IN NACL 1000-0.9 UT/500ML-% IV SOLN
INTRAVENOUS | Status: AC
  Filled 2021-03-10: qty 1000

## 2021-03-10 MED ORDER — FENTANYL CITRATE (PF) 100 MCG/2ML IJ SOLN
INTRAMUSCULAR | Status: AC
  Filled 2021-03-10: qty 2

## 2021-03-10 MED ORDER — HEPARIN (PORCINE) IN NACL 1000-0.9 UT/500ML-% IV SOLN
INTRAVENOUS | Status: DC | PRN
  Administered 2021-03-10 (×2): 500 mL

## 2021-03-10 MED ORDER — VERAPAMIL HCL 2.5 MG/ML IV SOLN
INTRAVENOUS | Status: AC
  Filled 2021-03-10: qty 2

## 2021-03-10 MED ORDER — HEPARIN BOLUS VIA INFUSION
4000.0000 [IU] | Freq: Once | INTRAVENOUS | Status: AC
  Administered 2021-03-10: 4000 [IU] via INTRAVENOUS

## 2021-03-10 MED ORDER — LIDOCAINE HCL (PF) 1 % IJ SOLN
INTRAMUSCULAR | Status: AC
  Filled 2021-03-10: qty 30

## 2021-03-10 MED ORDER — ASPIRIN 81 MG PO CHEW
324.0000 mg | CHEWABLE_TABLET | Freq: Once | ORAL | Status: AC
  Administered 2021-03-10: 324 mg via ORAL
  Filled 2021-03-10: qty 4

## 2021-03-10 MED ORDER — HEPARIN (PORCINE) 25000 UT/250ML-% IV SOLN
INTRAVENOUS | Status: AC
  Administered 2021-03-10: 1000 [IU]/h via INTRAVENOUS
  Filled 2021-03-10: qty 250

## 2021-03-10 MED ORDER — LIDOCAINE HCL (PF) 1 % IJ SOLN
INTRAMUSCULAR | Status: DC | PRN
  Administered 2021-03-10: 2 mL

## 2021-03-10 MED ORDER — MIDAZOLAM HCL 2 MG/2ML IJ SOLN
INTRAMUSCULAR | Status: AC
  Filled 2021-03-10: qty 2

## 2021-03-10 MED ORDER — MORPHINE SULFATE (PF) 4 MG/ML IV SOLN
4.0000 mg | Freq: Once | INTRAVENOUS | Status: AC
  Administered 2021-03-10: 4 mg via INTRAVENOUS
  Filled 2021-03-10: qty 1

## 2021-03-10 MED ORDER — HEPARIN (PORCINE) 25000 UT/250ML-% IV SOLN: 1000.0000 [IU]/h | INTRAVENOUS | Status: DC

## 2021-03-10 SURGICAL SUPPLY — 12 items
CATH 5FR JL3.5 JR4 ANG PIG MP (CATHETERS) ×2 IMPLANT
DEVICE RAD COMP TR BAND LRG (VASCULAR PRODUCTS) ×2 IMPLANT
GLIDESHEATH SLEND A-KIT 6F 22G (SHEATH) ×2 IMPLANT
GLIDESHEATH SLEND SS 6F .021 (SHEATH) IMPLANT
GUIDEWIRE INQWIRE 1.5J.035X260 (WIRE) ×1 IMPLANT
INQWIRE 1.5J .035X260CM (WIRE) ×2
KIT ENCORE 26 ADVANTAGE (KITS) ×2 IMPLANT
KIT HEART LEFT (KITS) ×2 IMPLANT
PACK CARDIAC CATHETERIZATION (CUSTOM PROCEDURE TRAY) ×2 IMPLANT
SHEATH PROBE COVER 6X72 (BAG) ×2 IMPLANT
TRANSDUCER W/STOPCOCK (MISCELLANEOUS) ×2 IMPLANT
TUBING CIL FLEX 10 FLL-RA (TUBING) ×2 IMPLANT

## 2021-03-10 NOTE — ED Notes (Signed)
[facility] here to transport pt, bedside report given. Loma Sousa, RN to assist with transport.

## 2021-03-10 NOTE — ED Provider Notes (Addendum)
[facility] EMERGENCY DEPARTMENT Provider Note   CSN: [id number] Arrival date & time: 03/10/21  2035     History Chief Complaint  Patient presents with   Chest Pain    [name] is a 65 y.o. male.   Chest Pain Associated symptoms: no fever    HPI: A 65 year old patient with a history of hypertension and hypercholesterolemia presents for evaluation of chest pain. Initial onset of pain was more than 6 hours ago. The patient's chest pain is described as heaviness/pressure/tightness, is sharp and is worse with exertion. The patient's chest pain is middle- or left-sided, is not well-localized and does not radiate to the arms/jaw/neck. The patient does not complain of nausea and denies diaphoresis. The patient has smoked in the past 90 days. The patient has no history of stroke, has no history of peripheral artery disease, denies any history of treated diabetes, has no relevant family history of coronary artery disease (first degree relative at less than age 64) and does not have an elevated BMI (>=30).  Patient has known history of heart disease.  Patient states he started having some symptoms last evening.  It improved a bit but then started acting up again early this afternoon around noon.  She tried taking some nitroglycerin but the symptoms never improved and the pain continued to worsen throughout the day  Past Medical History:  Diagnosis Date   Anxiety    Anxiety and depression    ASCVD (arteriosclerotic cardiovascular disease)    a. s/p PTCA to LCx in 1993 b. BMS to RCA in 01/2012 with residual 80% OM2 stenosis and medical management recommended c. 08/2019: cath showing patent stents with occluded OM2 --> medical management.    Asthma    CAD (coronary artery disease)    Carcinoma in situ of colon 2004   rectal polyp   Colitis, ischemic ([place]) 2011   COPD (chronic obstructive pulmonary disease) (HCC)    mild ;excercise induced hypoxemia by cp stress test ;asthma ,bronchitis,    Depression    Diverticulosis    Gastritis 12/30/10   EGD Dr [name]   GERD (gastroesophageal reflux disease)    GI bleed    H. 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)    Leukocytosis    [name]   Lung nodule 07/16/2011   Myocardial infarction [facility])    age 89   Restless leg syndrome    Schatzki's ring    Sleep apnea    does not use CPAP:cannot tolerate, PCP aware   Syncope    Tobacco abuse    50 pack years continuing at one halp pack daily   Tubular adenoma of colon 06/2009   Colonosocpy Dr [name]    Patient Active Problem List   Diagnosis Date Noted   Depression, major, single episode, in partial remission ([place]) 10/06/2020   Coronary artery disease involving native coronary artery of native heart with angina pectoris ([place])    Hyperlipidemia LDL goal <70    Complete tear of left rotator cuff 07/19/2019   Grade II hemorrhoids 03/08/2019   Diarrhea 02/08/2019   Dysphagia    Hiatal hernia    History of colonic polyps    Diverticulosis of colon without hemorrhage    Gastroparesis 11/20/2013   Chronic pain syndrome 10/23/2013   Nausea alone 07/23/2013   Hyperglycemia 04/25/2013  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 anxiety 09/20/2012   Pain in limb 09/20/2012   Loose stools 09/07/2012   Dyspepsia 09/05/2012   Dysphagia, unspecified(787.20) 09/05/2012   Gastroesophageal reflux disease 05/09/2012   Lung nodule 07/16/2011   RLS (restless legs syndrome) 03/09/2011   Chronic diarrhea 12/21/2010   Leukocytosis 12/21/2010   Cigarette smoker 09/23/2009   ATHEROSCLEROTIC CARDIOVASCULAR DISEASE 09/23/2009   Hyperlipidemia 10/01/2008   Essential hypertension 10/01/2008   Depression with anxiety 03/31/2007   OBSTRUCTIVE SLEEP APNEA 03/31/2007   COPD GOLD 0/ still smoking  03/31/2007    Past Surgical History:  Procedure Laterality Date   BIOPSY   12/19/2017   Procedure: BIOPSY;  Surgeon: [name], MD;  [facility];  Service: Endoscopy;;  gastric   CARDIAC CATHETERIZATION     CHOLECYSTECTOMY  2004   COLONOSCOPY  06/2009   normal terminal ileum, segmental mild inflammation of sigmoid colon (bx unremarkable), polyp, tubular adenoma   COLONOSCOPY N/A 07/31/2013   Dr.Rourk- redundant anal canal hemorrhoids, colonic diverticulosis, tubular adenoma   COLONOSCOPY W/ POLYPECTOMY  2004   rectal polyp with carcinoma in situ removed via colonoscopy   COLONOSCOPY WITH PROPOFOL N/A 09/15/2015   Dr. [name]: Scattered diverticula throughout the colon, 2 sessile polyps found in the descending colon and cecum, 5 mm in size.  Cecal polyp was sessile serrated polyp, descending colon polyp was a tubular adenoma.  He had a abnormal perianal exam along with grade 3 hemorrhoids.  Surveillance exam recommended for 5-year follow-up.   COLONOSCOPY WITH PROPOFOL N/A 12/19/2017   one 5 mm polyp in ascending colon and 1 cm sessile polyp in ascending s/p removal. Tubular adenoma. internal hemorrhoids   CORONARY ANGIOPLASTY WITH STENT PLACEMENT  01/26/2012   "1; total is now 2"   ESOPHAGOGASTRODUODENOSCOPY  02/2009   query Barrett's but bx negative   ESOPHAGOGASTRODUODENOSCOPY  12/30/2010   [name],gastritis, dilated 17F, sm HH, 1 small ulcer, Duodenal erosions, benign bx   ESOPHAGOGASTRODUODENOSCOPY (EGD) WITH ESOPHAGEAL DILATION N/A 09/12/2012   IWL:[id number] Schatzki's ring s/p Maloney dilator. Small hiatal hernia. negative path   ESOPHAGOGASTRODUODENOSCOPY (EGD) WITH ESOPHAGEAL DILATION N/A 07/31/2013   Dr. [name]- normal egd, s/p [facility] dilation empirically. Normal small bowel biopsies    ESOPHAGOGASTRODUODENOSCOPY (EGD) WITH PROPOFOL N/A 09/15/2015   Dr. [name]: Medium sized hiatal hernia, normal-appearing esophagus status post empiric dilation   ESOPHAGOGASTRODUODENOSCOPY (EGD) WITH PROPOFOL N/A 12/19/2017   erosive esophagitis s/p dilation,  erosive gastropathy, normal duodenum   ESOPHAGOGASTRODUODENOSCOPY (EGD) WITH PROPOFOL N/A 09/17/2019   Non-obstructing Schatzki's ring s/p dilation, medium-sized hiatal hernia, otherwise normal.   FLEXIBLE SIGMOIDOSCOPY  12/30/2010    [name],; internal hemorrhoids, anal papilla   HAND SURGERY     surgical intervention for injury of the fingers of the left hand many years ago   heart stent     HEMORRHOID BANDING     Dr. [name]   LEFT HEART CATH AND CORONARY ANGIOGRAPHY N/A 08/29/2019   Procedure: LEFT HEART CATH AND CORONARY ANGIOGRAPHY;  Surgeon: [name], MD;  Location: [place] CV LAB;  Service: Cardiovascular;  Laterality: N/A;   LEFT HEART CATHETERIZATION WITH CORONARY ANGIOGRAM N/A 01/26/2012   Procedure: LEFT HEART CATHETERIZATION WITH CORONARY ANGIOGRAM;  Surgeon: [name], MD;  Location: [facility] CATH LAB;  Service: Cardiovascular;  Laterality: N/A;   MALONEY DILATION N/A 09/15/2015   Procedure: [name] DILATION;  Surgeon: [name], MD;  [facility];  Service: Endoscopy;  Laterality: N/A;  MALONEY DILATION N/A 12/19/2017   Procedure: [name] DILATION;  Surgeon: [name], MD;  [facility];  Service: Endoscopy;  Laterality: N/A;   MALONEY DILATION N/A 09/17/2019   Procedure: [name] DILATION;  Surgeon: [name], MD;  [facility];  Service: Endoscopy;  Laterality: N/A;   NASAL SEPTOPLASTY W/ TURBINOPLASTY  10/06/2011   Procedure: NASAL SEPTOPLASTY WITH TURBINATE REDUCTION;  Surgeon: Izora Gala, MD;  Location: [place];  Service: ENT;  Laterality: Bilateral;   PERCUTANEOUS CORONARY STENT INTERVENTION (PCI-S) N/A 01/26/2012   Procedure: PERCUTANEOUS CORONARY STENT INTERVENTION (PCI-S);  Surgeon: [name], MD;  Location: [facility] CATH LAB;  Service: Cardiovascular;  Laterality: N/A;   POLYPECTOMY  09/15/2015   Procedure: POLYPECTOMY;  Surgeon: [name], MD;  [facility];  Service: Endoscopy;;  Cecal polyp removed  via cold snare/ Descending colon polyp removed via cold snare   POLYPECTOMY  12/19/2017   Procedure: POLYPECTOMY;  Surgeon: [name], MD;  [facility];  Service: Endoscopy;;  colon       Family History  Problem Relation Age of Onset   Lung disease Father        deceased, black lung   Heart disease Mother        blood clots   Depression Mother    Cancer Paternal Uncle        unknown type   Cancer Maternal Aunt        unknown type   Kidney failure Maternal Uncle    Hypertension Brother    Colon cancer Neg Hx    ADD / ADHD Neg Hx    Alcohol abuse Neg Hx    Drug abuse Neg Hx    Anxiety disorder Neg Hx    Bipolar disorder Neg Hx    Dementia Neg Hx    OCD Neg Hx    Paranoid behavior Neg Hx    Schizophrenia Neg Hx    Physical abuse Neg Hx    Sexual abuse Neg Hx    Seizures Neg Hx     Social History   Tobacco Use   Smoking status: Every Day    Packs/day: 1.00    Years: 48.00    Pack years: 48.00    Types: Cigarettes    Start date: 07/31/1969   Smokeless tobacco: Former  Scientific laboratory technician Use: Never used  Substance Use Topics   Alcohol use: Yes    Comment: Drinks a beer occasionally   Drug use: No    Home Medications Prior to Admission medications   Medication Sig Start Date End Date Taking? Authorizing Provider  albuterol (PROVENTIL) (2.5 MG/3ML) 0.083% nebulizer solution Take 3 mLs (2.5 mg total) by nebulization every 6 (six) hours as needed for wheezing or shortness of breath. 09/13/19   Strader, Fransisco Hertz, PA-C  albuterol (VENTOLIN HFA) 108 (90 Base) MCG/ACT inhaler Inhale 2 puffs into the lungs every 4 (four) hours as needed for wheezing or shortness of breath. 10/30/19   Tanda Rockers, MD  ALPRAZolam Duanne Moron) 1 MG tablet TAKE 1 TABLET BY MOUTH THREE TIMES A DAY AS NEEDED FOR ANXIETY 02/17/21   Cloria Spring, MD  amLODipine (NORVASC) 5 MG tablet Take 5 mg by mouth daily.    [provider]  aspirin EC 81 MG tablet Take 81 mg by mouth  daily.    [provider]  atorvastatin (LIPITOR) 80 MG tablet TAKE 1 TABLET BY MOUTH EVERY DAY  06/16/20   Arnoldo Lenis, MD  budesonide-formoterol (SYMBICORT) 80-4.5 MCG/ACT inhaler Take 2 puffs first thing in am and then another 2 puffs about 12 hours later. 10/30/19   Tanda Rockers, MD  CREON [phone] units CPEP capsule TAKE 2 CAPSULES BY MOUTH THREE TIMES DAILY WITH MEALS. 1 CAPSULE WITH SNACKS 11/04/20   Annitta Needs, NP  dicyclomine (BENTYL) 10 MG capsule TAKE 1 CAPSULE (10 MG TOTAL) BY MOUTH 4 (FOUR) TIMES DAILY - BEFORE MEALS AND AT BEDTIME. 11/15/19   Annitta Needs, NP  FLUoxetine (PROZAC) 20 MG capsule Take 1 capsule (20 mg total) by mouth daily. 10/07/20 10/07/21  Cloria Spring, MD  FLUoxetine (PROZAC) 40 MG capsule Take 1 capsule (40 mg total) by mouth daily. Take with 20 mg to equal 60 mg daily 10/07/20   Cloria Spring, MD  fluticasone [facility]) 50 MCG/ACT nasal spray Place 2 sprays into both nostrils daily. Patient taking differently: Place 2 sprays into both nostrils daily as needed for allergies. 10/19/16   [name], MD  gabapentin (NEURONTIN) 300 MG capsule Take 2 capsules (600 mg total) by mouth 2 (two) times daily. 10/07/20   Cloria Spring, MD  HYDROcodone-acetaminophen (NORCO) 7.5-325 MG tablet Take 1 tablet by mouth 2 (two) times daily as needed for moderate pain. 04/17/20   [name], MD  HYDROcodone-acetaminophen (NORCO) 7.5-325 MG tablet Take one tablet by mouth 2 (two) times daily as needed for moderate pain 01/05/21   [name], MD  HYDROcodone-acetaminophen (NORCO) 7.5-325 MG tablet Take one tablet po 2 times daily prn for moderate pain 01/05/21   [name], MD  hydrocortisone (ANUSOL-HC) 2.5 % rectal cream Place 1 application rectally 2 (two) times daily. Patient taking differently: Place 1 application rectally 2 (two) times daily. As needed 05/04/19   Annitta Needs, NP  isosorbide mononitrate (IMDUR) 30 MG 24 hr tablet Take 1 tablet (30  mg total) by mouth daily. 10/06/20 10/06/21  [name], MD  meloxicam (MOBIC) 15 MG tablet TAKE 1 TABLET BY MOUTH EVERY DAY 03/09/21   [name], MD  metoprolol succinate (TOPROL-XL) 50 MG 24 hr tablet TAKE 1 TABLET (50 MG TOTAL) BY MOUTH DAILY. PLEASE SCHEDULE FOLLOW UP APPOINTMENT. [place] YOU 12/08/20   Arnoldo Lenis, MD  naloxone [facility]) nasal spray 4 mg/0.1 mL Use as directed 01/21/20   [name], MD  nitroGLYCERIN (NITROSTAT) 0.4 MG SL tablet Place 1 tablet (0.4 mg total) under the tongue every 5 (five) minutes as needed for chest pain. 10/06/20   [name], MD  OLANZapine (ZYPREXA) 10 MG tablet Take 1 tablet (10 mg total) by mouth at bedtime. 10/07/20 10/07/21  Cloria Spring, MD  pantoprazole (PROTONIX) 40 MG tablet TAKE 1 TABLET BY MOUTH TWICE A DAY BEFORE A MEAL 07/03/20   Mahala Menghini, PA-C  polyethylene glycol-electrolytes (TRILYTE) 420 g solution Take 4,000 mLs by mouth as directed. 02/27/21   Rourk, [name], MD  rOPINIRole (REQUIP) 2 MG tablet TAKE 1 TABLET BY MOUTH EVERYDAY AT BEDTIME 10/06/20   [name], MD  telmisartan (MICARDIS) 40 MG tablet Take 1 tablet (40 mg total) by mouth daily. 10/30/19   Tanda Rockers, MD  traZODone (DESYREL) 50 MG tablet TAKE 1 TABLET BY MOUTH EVERYDAY AT BEDTIME 10/29/20   Cloria Spring, MD  vitamin B-12 (CYANOCOBALAMIN) 1000 MCG tablet Take 2,000 mcg by mouth daily.    [provider]  Allergies    Patient has no known allergies.  Review of Systems   Review of Systems  Constitutional:  Negative for fever.  Cardiovascular:  Positive for chest pain.  All other systems reviewed and are negative.  Physical Exam Updated Vital Signs BP (!) 147/104   Pulse 71   Temp 98 F (36.7 C) (Oral)   Resp (!) 22   Ht 1.778 m (5\' 10" )   Wt 67.6 kg   SpO2 97%   BMI 21.38 kg/m   Physical Exam Vitals and nursing note reviewed.  Constitutional:      Appearance: He is well-developed. He is not toxic-appearing or  diaphoretic.  HENT:     Head: Normocephalic and atraumatic.     Right Ear: External ear normal.     Left Ear: External ear normal.  Eyes:     General: No scleral icterus.       Right eye: No discharge.        Left eye: No discharge.     Conjunctiva/sclera: Conjunctivae normal.  Neck:     Trachea: No tracheal deviation.  Cardiovascular:     Rate and Rhythm: Normal rate and regular rhythm.  Pulmonary:     Effort: Pulmonary effort is normal. No respiratory distress.     Breath sounds: Normal breath sounds. No stridor. No wheezing or rales.  Abdominal:     General: Bowel sounds are normal. There is no distension.     Palpations: Abdomen is soft.     Tenderness: There is no abdominal tenderness. There is no guarding or rebound.  Musculoskeletal:        General: No tenderness or deformity.     Cervical back: Neck supple.  Skin:    General: Skin is warm and dry.     Findings: No rash.  Neurological:     General: No focal deficit present.     Mental Status: He is alert.     Cranial Nerves: No cranial nerve deficit (no facial droop, extraocular movements intact, no slurred speech).     Sensory: No sensory deficit.     Motor: No abnormal muscle tone or seizure activity.     Coordination: Coordination normal.  Psychiatric:        Mood and Affect: Mood normal.    ED Results / Procedures / Treatments   Labs (all labs ordered are listed, but only abnormal results are displayed) Labs Reviewed  BASIC METABOLIC PANEL - Abnormal; Notable for the following components:      Result Value   Glucose, Bld 115 (*)    All other components within normal limits  CBC - Abnormal; Notable for the following components:   Platelets 142 (*)    All other components within normal limits  TROPONIN I (HIGH SENSITIVITY) - Abnormal; Notable for the following components:   Troponin I (High Sensitivity) 3,242 (*)    All other components within normal limits  RESP PANEL BY RT-PCR (FLU A&B, COVID) [id number]   CBG MONITORING, ED  TROPONIN I (HIGH SENSITIVITY)    EKG EKG Interpretation  Date/Time:  Tuesday March 10 2021 22:25:52 EDT Ventricular Rate:  72 PR Interval:  123 QRS Duration: 122 QT Interval:  443 QTC Calculation: 485 R Axis:   -50 Text Interpretation: Sinus rhythm Probable left atrial enlargement Left bundle branch block Confirmed by Dorie Rank [phone]) on 03/10/2021 10:37:00 PM  Radiology DG Chest Portable 1 View  Result Date: 03/10/2021 CLINICAL DATA:  Central chest pain radiating to the bilateral  upper extremities, short of breath, dizziness EXAM: PORTABLE CHEST 1 VIEW COMPARISON:  08/28/2019 FINDINGS: Two frontal views of the chest demonstrate an unremarkable cardiac silhouette. No acute airspace disease, effusion, or pneumothorax. No acute bony abnormalities. IMPRESSION: 1. Stable chest, no acute process. Electronically Signed   By: Randa Ngo M.D.   On: 03/10/2021 21:17    Procedures .Critical Care Performed by: Dorie Rank, MD Authorized by: Dorie Rank, MD   Critical care provider statement:    Critical care time (minutes):  30   Critical care was time spent personally by me on the following activities:  Discussions with consultants, evaluation of patient's response to treatment, examination of patient, ordering and performing treatments and interventions, ordering and review of laboratory studies, ordering and review of radiographic studies, pulse oximetry, re-evaluation of patient's condition, obtaining history from patient or surrogate and review of old charts   Medications Ordered in ED Medications  nitroGLYCERIN (NITROSTAT) SL tablet 0.4 mg (0.4 mg Sublingual Given 03/10/21 2123)  nitroGLYCERIN 50 mg in dextrose 5 % 250 mL (0.2 mg/mL) infusion (10 mcg/min Intravenous Rate/Dose Change 03/10/21 2241)  aspirin chewable tablet 324 mg (324 mg Oral Given 03/10/21 2108)  morphine 4 MG/ML injection 4 mg (4 mg Intravenous Given 03/10/21 2109)    ED Course  I have  reviewed the triage vital signs and the nursing notes.  Pertinent labs & imaging results that were available during my care of the patient were reviewed by me and considered in my medical decision making (see chart for details).  Clinical Course as of 03/10/21 2257  Tue Mar 10, 2021  2233 Patient reassessed.  He is still having pain.  Troponin elevated at 3242.  Left bundle branch blocks persist.  IV heparin and nitroglycerin have been ordered. [JK]  2244 Case discussed with Dr. Tamala Julian.  We will activate the Cath Lab and transfer emergently to [facility] [JK]    Clinical Course User Index [JK] Dorie Rank, MD   MDM Rules/Calculators/A&P HEAR Score: 5                         Patient presents to the ED with complaints of chest pain.  Patient has a known history of heart disease.  EKG does show a new left bundle branch block.  Patient's initial troponin is elevated at 3000.  Presentation consistent with acute cardiac ischemia.  IV heparin nitroglycerin ordered.  Consult with cardiology for transfer for admission due to the patient's persistent pain, new left bundle branch block and elevated troponins.   Final Clinical Impression(s) / ED Diagnoses Final diagnoses:  ACS (acute coronary syndrome) ([place])        Dorie Rank, MD 03/10/21 2259

## 2021-03-10 NOTE — ED Notes (Signed)
Pt left department at this time with [facility] and Loma Sousa, Therapist, sports.

## 2021-03-10 NOTE — H&P (Signed)
Cardiology Admission History and Physical:   Patient ID: [name] MRN: [id number]; DOB: Apr 13, 1956   Admission date: 03/10/2021  PCP:  [name], MD   [facility] Providers Cardiologist:  Carlyle Dolly, MD        Chief Complaint:   Chest pain  Patient Profile:   [name] is a 65 y.o. male with coronary disease, PCI with a bare-metal stent to the RCA in 2013 and residual obstructive disease of an OM on catheterization in 2021, COPD, ongoing smoking, prior gastrointestinal bleeding and H. pylori, hypertension who is being seen 03/11/2021 for the evaluation of STEMI.  History of Present Illness:   [name] was in his usual state of health until yesterday when he developed substernal chest pain.  It continued to worsen over the last 24 hours and became excruciating this evening so he presented to the ED.  On arrival to the ED he was found to have a new left bundle branch block when compared with his most recent ECG in July of this year.  He was started on nitroglycerin infusion with some improvement of his pain.  Presentation was 8 out of 10 currently 5 out of 10.  He received aspirin and nitroglycerin in route.  He underwent catheterization in July of this year for suspected unstable angina.  At that time his RCA stent was widely patent but he now had a flush occluded OM 2 which had previously been obstructive and elected for medical management.  He had residual nonobstructive disease of his ostial left circumflex as well as proximal LAD.  Echocardiogram at that time showed normal LV function with some inferior lateral and inferior RCA regional wall motion abnormalities which were stable from prior.  Normal RV function and valvular function.  Reported he is not on DAPT.  He continues to smoke.  No diabetes.  No history of renal disease.  He does not have any recent bleeding episodes continues to follow for with GI for history of H. pylori and prior gastrointestinal  bleeding.  Findings at angiography include high grade 99% ostial with tandem OM, high risk for intervention, with lateral hypokinesis on LV gram.  Borderline obstructive mLAD/D1 bifurcation and LM.   Past Medical History:  Diagnosis Date   Anxiety    Anxiety and depression    ASCVD (arteriosclerotic cardiovascular disease)    a. s/p PTCA to LCx in 1993 b. BMS to RCA in 01/2012 with residual 80% OM2 stenosis and medical management recommended c. 08/2019: cath showing patent stents with occluded OM2 --> medical management.    Asthma    CAD (coronary artery disease)    Carcinoma in situ of colon 2004   rectal polyp   Colitis, ischemic ([place]) 2011   COPD (chronic obstructive pulmonary disease) (HCC)    mild ;excercise induced hypoxemia by cp stress test ;asthma ,bronchitis,   Depression    Diverticulosis    Gastritis 12/30/10   EGD Dr [name]   GERD (gastroesophageal reflux disease)    GI bleed    H. 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)    Leukocytosis    [name]   Lung nodule 07/16/2011   Myocardial infarction [facility])    age 75   Restless leg syndrome    Schatzki's ring    Sleep apnea    does  not use CPAP:cannot tolerate, PCP aware   Syncope    Tobacco abuse    50 pack years continuing at one halp pack daily   Tubular adenoma of colon 06/2009   Colonosocpy Dr [name]    Past Surgical History:  Procedure Laterality Date   BIOPSY  12/19/2017   Procedure: BIOPSY;  Surgeon: [name], MD;  [facility];  Service: Endoscopy;;  gastric   CARDIAC CATHETERIZATION     CHOLECYSTECTOMY  2004   COLONOSCOPY  06/2009   normal terminal ileum, segmental mild inflammation of sigmoid colon (bx unremarkable), polyp, tubular adenoma   COLONOSCOPY N/A 07/31/2013   Dr.Rourk- redundant anal canal hemorrhoids, colonic diverticulosis, tubular adenoma    COLONOSCOPY W/ POLYPECTOMY  2004   rectal polyp with carcinoma in situ removed via colonoscopy   COLONOSCOPY WITH PROPOFOL N/A 09/15/2015   Dr. [name]: Scattered diverticula throughout the colon, 2 sessile polyps found in the descending colon and cecum, 5 mm in size.  Cecal polyp was sessile serrated polyp, descending colon polyp was a tubular adenoma.  He had a abnormal perianal exam along with grade 3 hemorrhoids.  Surveillance exam recommended for 5-year follow-up.   COLONOSCOPY WITH PROPOFOL N/A 12/19/2017   one 5 mm polyp in ascending colon and 1 cm sessile polyp in ascending s/p removal. Tubular adenoma. internal hemorrhoids   CORONARY ANGIOPLASTY WITH STENT PLACEMENT  01/26/2012   "1; total is now 2"   ESOPHAGOGASTRODUODENOSCOPY  02/2009   query Barrett's but bx negative   ESOPHAGOGASTRODUODENOSCOPY  12/30/2010   [name] [name],gastritis, dilated 67F, sm HH, 1 small ulcer, Duodenal erosions, benign bx   ESOPHAGOGASTRODUODENOSCOPY (EGD) WITH ESOPHAGEAL DILATION N/A 09/12/2012   IWL:[id number] Schatzki's ring s/p Maloney dilator. Small hiatal hernia. negative path   ESOPHAGOGASTRODUODENOSCOPY (EGD) WITH ESOPHAGEAL DILATION N/A 07/31/2013   Dr. [name]- normal egd, s/p [facility] dilation empirically. Normal small bowel biopsies    ESOPHAGOGASTRODUODENOSCOPY (EGD) WITH PROPOFOL N/A 09/15/2015   Dr. [name]: Medium sized hiatal hernia, normal-appearing esophagus status post empiric dilation   ESOPHAGOGASTRODUODENOSCOPY (EGD) WITH PROPOFOL N/A 12/19/2017   erosive esophagitis s/p dilation, erosive gastropathy, normal duodenum   ESOPHAGOGASTRODUODENOSCOPY (EGD) WITH PROPOFOL N/A 09/17/2019   Non-obstructing Schatzki's ring s/p dilation, medium-sized hiatal hernia, otherwise normal.   FLEXIBLE SIGMOIDOSCOPY  12/30/2010    [name] [name],; internal hemorrhoids, anal papilla   HAND SURGERY     surgical intervention for injury of the fingers of the left hand many years ago   heart stent     HEMORRHOID BANDING      Dr. [name]   LEFT HEART CATH AND CORONARY ANGIOGRAPHY N/A 08/29/2019   Procedure: LEFT HEART CATH AND CORONARY ANGIOGRAPHY;  Surgeon: [name], MD;  Location: [place] [facility];  Service: Cardiovascular;  Laterality: N/A;   LEFT HEART CATHETERIZATION WITH CORONARY ANGIOGRAM N/A 01/26/2012   Procedure: LEFT HEART CATHETERIZATION WITH CORONARY ANGIOGRAM;  Surgeon: [name], MD;  Location: [facility] CATH LAB;  Service: Cardiovascular;  Laterality: N/A;   MALONEY DILATION N/A 09/15/2015   Procedure: [name] DILATION;  Surgeon: [name], MD;  [facility];  Service: Endoscopy;  Laterality: N/A;   MALONEY DILATION N/A 12/19/2017   Procedure: [name] DILATION;  Surgeon: [name], MD;  [facility];  Service: Endoscopy;  Laterality: N/A;   MALONEY DILATION N/A 09/17/2019   Procedure: [name] DILATION;  Surgeon: [name], MD;  [facility];  Service: Endoscopy;  Laterality: N/A;  NASAL SEPTOPLASTY W/ TURBINOPLASTY  10/06/2011   Procedure: NASAL SEPTOPLASTY WITH TURBINATE REDUCTION;  Surgeon: Izora Gala, MD;  Location: [place];  Service: ENT;  Laterality: Bilateral;   PERCUTANEOUS CORONARY STENT INTERVENTION (PCI-S) N/A 01/26/2012   Procedure: PERCUTANEOUS CORONARY STENT INTERVENTION (PCI-S);  Surgeon: [name], MD;  Location: [facility] CATH LAB;  Service: Cardiovascular;  Laterality: N/A;   POLYPECTOMY  09/15/2015   Procedure: POLYPECTOMY;  Surgeon: [name], MD;  [facility];  Service: Endoscopy;;  Cecal polyp removed via cold snare/ Descending colon polyp removed via cold snare   POLYPECTOMY  12/19/2017   Procedure: POLYPECTOMY;  Surgeon: [name], MD;  [facility];  Service: Endoscopy;;  colon     Medications Prior to Admission: Prior to Admission medications   Medication Sig Start Date End Date Taking? Authorizing Provider  albuterol (PROVENTIL) (2.5 MG/3ML) 0.083% nebulizer solution Take 3 mLs (2.5 mg total) by  nebulization every 6 (six) hours as needed for wheezing or shortness of breath. 09/13/19   Strader, Fransisco Hertz, PA-C  albuterol (VENTOLIN HFA) 108 (90 Base) MCG/ACT inhaler Inhale 2 puffs into the lungs every 4 (four) hours as needed for wheezing or shortness of breath. 10/30/19   Tanda Rockers, MD  ALPRAZolam Duanne Moron) 1 MG tablet TAKE 1 TABLET BY MOUTH THREE TIMES A DAY AS NEEDED FOR ANXIETY 02/17/21   Cloria Spring, MD  amLODipine (NORVASC) 5 MG tablet Take 5 mg by mouth daily.    [provider]  aspirin EC 81 MG tablet Take 81 mg by mouth daily.    [provider]  atorvastatin (LIPITOR) 80 MG tablet TAKE 1 TABLET BY MOUTH EVERY DAY 06/16/20   Arnoldo Lenis, MD  budesonide-formoterol [facility]) 80-4.5 MCG/ACT inhaler Take 2 puffs first thing in am and then another 2 puffs about 12 hours later. 10/30/19   Tanda Rockers, MD  CREON [phone] units CPEP capsule TAKE 2 CAPSULES BY MOUTH THREE TIMES DAILY WITH MEALS. 1 CAPSULE WITH SNACKS 11/04/20   Annitta Needs, NP  dicyclomine (BENTYL) 10 MG capsule TAKE 1 CAPSULE (10 MG TOTAL) BY MOUTH 4 (FOUR) TIMES DAILY - BEFORE MEALS AND AT BEDTIME. 11/15/19   Annitta Needs, NP  FLUoxetine (PROZAC) 20 MG capsule Take 1 capsule (20 mg total) by mouth daily. 10/07/20 10/07/21  Cloria Spring, MD  FLUoxetine (PROZAC) 40 MG capsule Take 1 capsule (40 mg total) by mouth daily. Take with 20 mg to equal 60 mg daily 10/07/20   Cloria Spring, MD  fluticasone [facility]) 50 MCG/ACT nasal spray Place 2 sprays into both nostrils daily. Patient taking differently: Place 2 sprays into both nostrils daily as needed for allergies. 10/19/16   [name], MD  gabapentin (NEURONTIN) 300 MG capsule Take 2 capsules (600 mg total) by mouth 2 (two) times daily. 10/07/20   Cloria Spring, MD  HYDROcodone-acetaminophen (NORCO) 7.5-325 MG tablet Take 1 tablet by mouth 2 (two) times daily as needed for moderate pain. 04/17/20   [name], MD   HYDROcodone-acetaminophen (NORCO) 7.5-325 MG tablet Take one tablet by mouth 2 (two) times daily as needed for moderate pain 01/05/21   [name], MD  HYDROcodone-acetaminophen (NORCO) 7.5-325 MG tablet Take one tablet po 2 times daily prn for moderate pain 01/05/21   [name], MD  hydrocortisone (ANUSOL-HC) 2.5 % rectal cream Place 1 application rectally 2 (two) times daily. Patient taking differently: Place 1 application rectally  2 (two) times daily. As needed 05/04/19   Annitta Needs, NP  isosorbide mononitrate (IMDUR) 30 MG 24 hr tablet Take 1 tablet (30 mg total) by mouth daily. 10/06/20 10/06/21  [name], MD  meloxicam (MOBIC) 15 MG tablet TAKE 1 TABLET BY MOUTH EVERY DAY 03/09/21   [name], MD  metoprolol succinate (TOPROL-XL) 50 MG 24 hr tablet TAKE 1 TABLET (50 MG TOTAL) BY MOUTH DAILY. PLEASE SCHEDULE FOLLOW UP APPOINTMENT. [place] YOU 12/08/20   Arnoldo Lenis, MD  naloxone [facility]) nasal spray 4 mg/0.1 mL Use as directed 01/21/20   [name], MD  nitroGLYCERIN (NITROSTAT) 0.4 MG SL tablet Place 1 tablet (0.4 mg total) under the tongue every 5 (five) minutes as needed for chest pain. 10/06/20   [name], MD  OLANZapine (ZYPREXA) 10 MG tablet Take 1 tablet (10 mg total) by mouth at bedtime. 10/07/20 10/07/21  Cloria Spring, MD  pantoprazole (PROTONIX) 40 MG tablet TAKE 1 TABLET BY MOUTH TWICE A DAY BEFORE A MEAL 07/03/20   Mahala Menghini, PA-C  polyethylene glycol-electrolytes (TRILYTE) 420 g solution Take 4,000 mLs by mouth as directed. 02/27/21   [name], [name], MD  rOPINIRole (REQUIP) 2 MG tablet TAKE 1 TABLET BY MOUTH EVERYDAY AT BEDTIME 10/06/20   [name], MD  telmisartan (MICARDIS) 40 MG tablet Take 1 tablet (40 mg total) by mouth daily. 10/30/19   Tanda Rockers, MD  traZODone (DESYREL) 50 MG tablet TAKE 1 TABLET BY MOUTH EVERYDAY AT BEDTIME 10/29/20   Cloria Spring, MD  vitamin B-12 (CYANOCOBALAMIN) 1000 MCG tablet Take 2,000 mcg by mouth  daily.    [provider]     Allergies:   No Known Allergies  Social History:   Social History   Socioeconomic History   Marital status: Married    Spouse name: Not on file   Number of children: 3   Years of education: Not on file   Highest education level: Not on file  Occupational History   Occupation: disable    Employer: RETIRED    Comment: [employer]  Tobacco Use   Smoking status: Every Day    Packs/day: 1.00    Years: 48.00    Pack years: 48.00    Types: Cigarettes    Start date: 07/31/1969   Smokeless tobacco: Former  Scientific laboratory technician Use: Never used  Substance and Sexual Activity   Alcohol use: Yes    Comment: Drinks a beer occasionally   Drug use: No   Sexual activity: Not Currently  Other Topics Concern   Not on file  Social History Narrative   3 stepchildren   Social Determinants of Health   Financial Resource Strain: Not on file  Food Insecurity: Not on file  Transportation Needs: Not on file  Physical Activity: Not on file  Stress: Not on file  Social Connections: Not on file  Intimate Partner Violence: Not on file    Family History: Reviewed.  Noncontributory. The patient's family history includes Cancer in his maternal aunt and paternal uncle; Depression in his mother; Heart disease in his mother; Hypertension in his brother; Kidney failure in his maternal uncle; Lung disease in his father. There is no history of Colon cancer, ADD / ADHD, Alcohol abuse, Drug abuse, Anxiety disorder, Bipolar disorder, Dementia, OCD, Paranoid behavior, Schizophrenia, Physical abuse, Sexual abuse, or Seizures.    ROS:  Please see the history of present illness.  All other  ROS reviewed and negative.     Physical Exam/Data:   Vitals:   03/11/21 0330 03/11/21 0345 03/11/21 0400 03/11/21 0415  BP: (!) 122/91 (!) 121/93 (!) 127/92 128/88  Pulse: 68 68 68 70  Resp: (!) 23 (!) 22 20 (!) 23  Temp:      TempSrc:      SpO2: 94% 95% 95% 95%  Weight:      Height:         Intake/Output Summary (Last 24 hours) at 03/11/2021 0554 Last data filed at 03/11/2021 0400 Gross per 24 hour  Intake 473.28 ml  Output --  Net 473.28 ml   Last 3 Weights 03/10/2021 03/10/2021 02/27/2021  Weight (lbs) 149 lb 149 lb 151 lb 6.4 oz  Weight (kg) 67.586 kg 67.586 kg 68.675 kg  Some encounter information is confidential and restricted. Go to Review Flowsheets activity to see all data.     Body mass index is 21.38 kg/m.  General:  Well nourished, well developed, in no acute distress lying on the stretcher. HEENT: normal Neck: no JVD Vascular: No carotid bruits; Distal pulses 2+ bilaterally   Cardiac:  normal S1, S2; RRR; no murmur  Lungs:  clear to auscultation bilaterally, no wheezing, rhonchi or rales  Abd: soft, nontender, no hepatomegaly  Ext: no edema Musculoskeletal:  No deformities, BUE and BLE strength normal and equal Skin: warm and dry  Neuro:  CNs 2-12 intact, no focal abnormalities noted Psych:  Normal affect    EKG:  The ECG that was done normal sinus rhythm with left bundle branch block versus LVH with QRS widening and secondary repolarization abnormalities.  No significant ST segment deviation.  Left bundle branch pattern is new.  Relevant CV Studies: I have personally reviewed the films. Prior angiogram in 2022 via right radial approach patent right coronary stent, occluded OM 2, nonobstructive disease of the LAD and left circumflex distribution.  Borderline disease of the ostial circumflex.  Transthoracic echo in July of this year with normal LVEF with known inferior RCA territory wall motion abnormalities.  Laboratory Data:  High Sensitivity Troponin:   Recent Labs  Lab 03/10/21 2058 03/10/21 2250  TROPONINIHS 3,242* 2,422*      Chemistry Recent Labs  Lab 03/10/21 2058 03/11/21 0109  NA 139 137  K 3.7 3.2*  CL 103 105  CO2 28 23  GLUCOSE 115* 139*  BUN 10 8  CREATININE 0.83 0.74  CALCIUM 8.9 8.4*  MG  --  1.5*  GFRNONAA >60  >60  ANIONGAP 8 9    Recent Labs  Lab 03/11/21 0109  PROT 5.4*  ALBUMIN 3.2*  AST 57*  ALT 40  ALKPHOS 84  BILITOT 0.5   Lipids  Recent Labs  Lab 03/11/21 0109  CHOL 85  TRIG 82  HDL 42  LDLCALC 27  CHOLHDL 2.0   Hematology Recent Labs  Lab 03/10/21 2058 03/11/21 0109  WBC 8.9 9.1  RBC 4.54 4.16*  HGB 15.4 13.7  HCT 43.2 38.7*  MCV 95.2 93.0  MCH 33.9 32.9  MCHC 35.6 35.4  RDW 12.9 12.8  PLT 142* 132*   Thyroid  Recent Labs  Lab 03/11/21 0109  TSH 1.154   BNP Recent Labs  Lab 03/11/21 0109  BNP 341.6*    DDimer No results for input(s): DDIMER in the last 168 hours.   Radiology/Studies:  CARDIAC CATHETERIZATION  Result Date: 03/11/2021 CONCLUSIONS: Acute coronary syndrome with ostial 99% of circumflex progression.  First marginal 70%. 40 to  50% distal left main 40 to 50% mid LAD with 40% first diagonal Right coronary with diffuse 30 to 40% in-stent restenosis proximal and eccentric 50 to 60% mid to distal stenosis. LV systolic dysfunction with regional wall motion abnormality, and elevated LVEDP of 22 mmHg.  Findings are consistent with acute on chronic combined systolic and diastolic heart failure. RECOMMENDATIONS: IV Aggrastat IV heparin IV nitro Notify Dr. Kipp Brood of patient's presence and requested consideration of surgical revascularization.  Could also discuss other treatment options which could include circumflex stent with extension into the left main however it would gel the LAD.  Left main stenting would not be an optimal long-term strategy for this patient given his age of 54. Was not loaded with P2 Y 12 agent.   DG Chest Portable 1 View  Result Date: 03/10/2021 CLINICAL DATA:  Central chest pain radiating to the bilateral upper extremities, short of breath, dizziness EXAM: PORTABLE CHEST 1 VIEW COMPARISON:  08/28/2019 FINDINGS: Two frontal views of the chest demonstrate an unremarkable cardiac silhouette. No acute airspace disease, effusion, or  pneumothorax. No acute bony abnormalities. IMPRESSION: 1. Stable chest, no acute process. Electronically Signed   By: Randa Ngo M.D.   On: 03/10/2021 21:17     Assessment and Plan:   65 year old male with a history of coronary artery disease and prior myocardial infarction, PCI with bare-metal stent to the RCA, known occluded OM 2, smoking, hypertension presenting with chest pain and concern for new left bundle branch block consistent with STEMI.  Pain is ongoing at time of presentation.  Found to have ostial LCx lesion with associated lateral hypokinesis.  Given lesion complexity/risk of percutaneous approach, opted for medical management for now with planned CTS consultation for consideration of surgical revascularization.  Plan #STEMI #CAD hx PCI OM1 - ASA 81 - Heparin infusion - Continuous aggrastat - Home metoprolol sucicnate - Nitroglycerin infusion for pain; titrate to blood pressure - High intensity statin - Lipid, A1C, TSH - Echo - CTS consult.  #HTN - Continue home amlodipine 5, telmisartan 40 mg daily  #Depression/anxiety - Continue home fluoxetine 40 mg daily - Continue home olanzapine 10 mg nightly  #RLS - Continue home ropinorole  #Smoking #COPD - LABA/ICS - Albuterol PRN - Lifestyle modification discussed - NRT  Risk Assessment/Risk Scores:    TIMI Risk Score for ST  Elevation MI:   The patient's TIMI risk score is 2, which indicates a 2.2% risk of all cause mortality at 30 days.        Severity of Illness: The appropriate patient status for this patient is INPATIENT. Inpatient status is judged to be reasonable and necessary in order to provide the required intensity of service to ensure the patient's safety. The patient's presenting symptoms, physical exam findings, and initial radiographic and laboratory data in the context of their chronic comorbidities is felt to place them at high risk for further clinical deterioration. Furthermore, it is not  anticipated that the patient will be medically stable for discharge from the hospital within 2 midnights of admission. The following factors support the patient status of inpatient.   " The patient's presenting symptoms include chest pain. " The worrisome physical exam findings include chest pain. " The initial radiographic and laboratory data are worrisome because of STEMI. " The chronic co-morbidities include smoking, CAD, COPD,.   * I certify that at the point of admission it is my clinical judgment that the patient will require inpatient hospital care spanning beyond 2 midnights from  the point of admission due to high intensity of service, high risk for further deterioration and high frequency of surveillance required.*   For questions or updates, please contact [place] Please consult [email or web address] for contact info under     Signed, Delight Hoh, MD  03/11/2021 5:54 AM

## 2021-03-10 NOTE — ED Triage Notes (Signed)
Pt here from home with chest pain that started yesterday, eased up and resolved, then started back again today with radiation and pain to both arms, pt also reports sob and slight dizziness. Pt took nitro prior to arrival with no relief. Pt has cardiac hx

## 2021-03-11 ENCOUNTER — Encounter (HOSPITAL_COMMUNITY): Payer: Self-pay | Admitting: Interventional Cardiology

## 2021-03-11 ENCOUNTER — Inpatient Hospital Stay (HOSPITAL_COMMUNITY): Payer: Medicare PPO

## 2021-03-11 DIAGNOSIS — Z791 Long term (current) use of non-steroidal anti-inflammatories (NSAID): Secondary | ICD-10-CM | POA: Diagnosis not present

## 2021-03-11 DIAGNOSIS — R4182 Altered mental status, unspecified: Secondary | ICD-10-CM | POA: Diagnosis not present

## 2021-03-11 DIAGNOSIS — R569 Unspecified convulsions: Secondary | ICD-10-CM | POA: Diagnosis not present

## 2021-03-11 DIAGNOSIS — Z720 Tobacco use: Secondary | ICD-10-CM | POA: Diagnosis not present

## 2021-03-11 DIAGNOSIS — I219 Acute myocardial infarction, unspecified: Secondary | ICD-10-CM

## 2021-03-11 DIAGNOSIS — E876 Hypokalemia: Secondary | ICD-10-CM | POA: Diagnosis not present

## 2021-03-11 DIAGNOSIS — F419 Anxiety disorder, unspecified: Secondary | ICD-10-CM | POA: Diagnosis present

## 2021-03-11 DIAGNOSIS — Z781 Physical restraint status: Secondary | ICD-10-CM | POA: Diagnosis not present

## 2021-03-11 DIAGNOSIS — F17210 Nicotine dependence, cigarettes, uncomplicated: Secondary | ICD-10-CM | POA: Diagnosis present

## 2021-03-11 DIAGNOSIS — I249 Acute ischemic heart disease, unspecified: Secondary | ICD-10-CM | POA: Diagnosis present

## 2021-03-11 DIAGNOSIS — I248 Other forms of acute ischemic heart disease: Secondary | ICD-10-CM | POA: Diagnosis not present

## 2021-03-11 DIAGNOSIS — G2581 Restless legs syndrome: Secondary | ICD-10-CM | POA: Diagnosis not present

## 2021-03-11 DIAGNOSIS — K219 Gastro-esophageal reflux disease without esophagitis: Secondary | ICD-10-CM | POA: Diagnosis present

## 2021-03-11 DIAGNOSIS — I2510 Atherosclerotic heart disease of native coronary artery without angina pectoris: Secondary | ICD-10-CM | POA: Diagnosis not present

## 2021-03-11 DIAGNOSIS — R339 Retention of urine, unspecified: Secondary | ICD-10-CM | POA: Diagnosis not present

## 2021-03-11 DIAGNOSIS — Y831 Surgical operation with implant of artificial internal device as the cause of abnormal reaction of the patient, or of later complication, without mention of misadventure at the time of the procedure: Secondary | ICD-10-CM | POA: Diagnosis present

## 2021-03-11 DIAGNOSIS — I5043 Acute on chronic combined systolic (congestive) and diastolic (congestive) heart failure: Secondary | ICD-10-CM | POA: Diagnosis not present

## 2021-03-11 DIAGNOSIS — J449 Chronic obstructive pulmonary disease, unspecified: Secondary | ICD-10-CM | POA: Diagnosis not present

## 2021-03-11 DIAGNOSIS — Z7982 Long term (current) use of aspirin: Secondary | ICD-10-CM | POA: Diagnosis not present

## 2021-03-11 DIAGNOSIS — I25110 Atherosclerotic heart disease of native coronary artery with unstable angina pectoris: Secondary | ICD-10-CM | POA: Diagnosis not present

## 2021-03-11 DIAGNOSIS — G4733 Obstructive sleep apnea (adult) (pediatric): Secondary | ICD-10-CM | POA: Diagnosis present

## 2021-03-11 DIAGNOSIS — I214 Non-ST elevation (NSTEMI) myocardial infarction: Secondary | ICD-10-CM | POA: Diagnosis not present

## 2021-03-11 DIAGNOSIS — E785 Hyperlipidemia, unspecified: Secondary | ICD-10-CM

## 2021-03-11 DIAGNOSIS — G9340 Encephalopathy, unspecified: Secondary | ICD-10-CM | POA: Diagnosis not present

## 2021-03-11 DIAGNOSIS — R4189 Other symptoms and signs involving cognitive functions and awareness: Secondary | ICD-10-CM | POA: Diagnosis not present

## 2021-03-11 DIAGNOSIS — Z01810 Encounter for preprocedural cardiovascular examination: Secondary | ICD-10-CM

## 2021-03-11 DIAGNOSIS — I472 Ventricular tachycardia, unspecified: Secondary | ICD-10-CM | POA: Diagnosis not present

## 2021-03-11 DIAGNOSIS — I213 ST elevation (STEMI) myocardial infarction of unspecified site: Secondary | ICD-10-CM | POA: Diagnosis not present

## 2021-03-11 DIAGNOSIS — R29818 Other symptoms and signs involving the nervous system: Secondary | ICD-10-CM | POA: Diagnosis not present

## 2021-03-11 DIAGNOSIS — I252 Old myocardial infarction: Secondary | ICD-10-CM | POA: Diagnosis not present

## 2021-03-11 DIAGNOSIS — I110 Hypertensive heart disease with heart failure: Secondary | ICD-10-CM | POA: Diagnosis not present

## 2021-03-11 DIAGNOSIS — R4689 Other symptoms and signs involving appearance and behavior: Secondary | ICD-10-CM | POA: Diagnosis not present

## 2021-03-11 DIAGNOSIS — F32A Depression, unspecified: Secondary | ICD-10-CM | POA: Diagnosis not present

## 2021-03-11 DIAGNOSIS — K5730 Diverticulosis of large intestine without perforation or abscess without bleeding: Secondary | ICD-10-CM | POA: Diagnosis present

## 2021-03-11 DIAGNOSIS — Z20822 Contact with and (suspected) exposure to covid-19: Secondary | ICD-10-CM | POA: Diagnosis not present

## 2021-03-11 DIAGNOSIS — E7800 Pure hypercholesterolemia, unspecified: Secondary | ICD-10-CM | POA: Diagnosis not present

## 2021-03-11 DIAGNOSIS — T82855A Stenosis of coronary artery stent, initial encounter: Secondary | ICD-10-CM | POA: Diagnosis not present

## 2021-03-11 LAB — COMPREHENSIVE METABOLIC PANEL
ALT: 40 U/L (ref 0–44)
AST: 57 U/L — ABNORMAL HIGH (ref 15–41)
Albumin: 3.2 g/dL — ABNORMAL LOW (ref 3.5–5.0)
Alkaline Phosphatase: 84 U/L (ref 38–126)
Anion gap: 9 (ref 5–15)
BUN: 8 mg/dL (ref 8–23)
CO2: 23 mmol/L (ref 22–32)
Calcium: 8.4 mg/dL — ABNORMAL LOW (ref 8.9–10.3)
Chloride: 105 mmol/L (ref 98–111)
Creatinine, Ser: 0.74 mg/dL (ref 0.61–1.24)
GFR, Estimated: >60 mL/min (ref >60)
Glucose, Bld: 139 mg/dL — ABNORMAL HIGH (ref 70–99)
Potassium: 3.2 mmol/L — ABNORMAL LOW (ref 3.5–5.1)
Sodium: 137 mmol/L (ref 135–145)
Total Bilirubin: 0.5 mg/dL (ref 0.3–1.2)
Total Protein: 5.4 g/dL — ABNORMAL LOW (ref 6.5–8.1)

## 2021-03-11 LAB — HEPARIN LEVEL (UNFRACTIONATED)
Heparin Unfractionated: <0.1 IU/mL — ABNORMAL LOW (ref 0.30–0.70)
Heparin Unfractionated: <0.1 IU/mL — ABNORMAL LOW (ref 0.30–0.70)

## 2021-03-11 LAB — LIPID PANEL
Cholesterol: 85 mg/dL (ref 0–200)
HDL: 42 mg/dL (ref >40)
LDL Cholesterol: 27 mg/dL (ref 0–99)
Total CHOL/HDL Ratio: 2 RATIO
Triglycerides: 82 mg/dL (ref <150)
VLDL: 16 mg/dL (ref 0–40)

## 2021-03-11 LAB — CBC
HCT: 38.7 % — ABNORMAL LOW (ref 39.0–52.0)
Hemoglobin: 13.7 g/dL (ref 13.0–17.0)
MCH: 32.9 pg (ref 26.0–34.0)
MCHC: 35.4 g/dL (ref 30.0–36.0)
MCV: 93 fL (ref 80.0–100.0)
Platelets: 132 10*3/uL — ABNORMAL LOW (ref 150–400)
RBC: 4.16 MIL/uL — ABNORMAL LOW (ref 4.22–5.81)
RDW: 12.8 % (ref 11.5–15.5)
WBC: 9.1 10*3/uL (ref 4.0–10.5)
nRBC: 0 % (ref 0.0–0.2)

## 2021-03-11 LAB — ECHOCARDIOGRAM COMPLETE
Area-P 1/2: 4.89 cm2
Height: 70 in
MV M vel: 5.35 m/s
MV Peak grad: 114.5 mmHg
Radius: 0.4 cm
S' Lateral: 4.6 cm
Weight: 2395.08 oz

## 2021-03-11 LAB — HEMOGLOBIN A1C
Hgb A1c MFr Bld: 4.9 % (ref 4.8–5.6)
Mean Plasma Glucose: 94 mg/dL

## 2021-03-11 LAB — GLUCOSE, CAPILLARY: Glucose-Capillary: 146 mg/dL — ABNORMAL HIGH (ref 70–99)

## 2021-03-11 LAB — TSH: TSH: 1.154 u[IU]/mL (ref 0.350–4.500)

## 2021-03-11 LAB — BRAIN NATRIURETIC PEPTIDE: B Natriuretic Peptide: 341.6 pg/mL — ABNORMAL HIGH (ref 0.0–100.0)

## 2021-03-11 LAB — MRSA NEXT GEN BY PCR, NASAL: MRSA by PCR Next Gen: DETECTED — AB

## 2021-03-11 LAB — HIV ANTIBODY (ROUTINE TESTING W REFLEX): HIV Screen 4th Generation wRfx: NONREACTIVE

## 2021-03-11 LAB — POCT ACTIVATED CLOTTING TIME: Activated Clotting Time: 214 seconds

## 2021-03-11 LAB — MAGNESIUM: Magnesium: 1.5 mg/dL — ABNORMAL LOW (ref 1.7–2.4)

## 2021-03-11 MED ORDER — TIROFIBAN HCL IN NACL 5-0.9 MG/100ML-% IV SOLN
INTRAVENOUS | Status: AC | PRN
  Administered 2021-03-11: .15 ug/kg/min via INTRAVENOUS

## 2021-03-11 MED ORDER — TIROFIBAN HCL IN NACL 5-0.9 MG/100ML-% IV SOLN
0.1500 ug/kg/min | INTRAVENOUS | Status: AC
  Administered 2021-03-11 – 2021-03-12 (×2): 0.15 ug/kg/min via INTRAVENOUS
  Filled 2021-03-11 (×3): qty 100

## 2021-03-11 MED ORDER — ASPIRIN EC 81 MG PO TBEC: 81.0000 mg | DELAYED_RELEASE_TABLET | Freq: Every day | ORAL | Status: DC

## 2021-03-11 MED ORDER — ACETAMINOPHEN 325 MG PO TABS: 650.0000 mg | ORAL_TABLET | ORAL | Status: DC | PRN

## 2021-03-11 MED ORDER — NITROGLYCERIN 0.4 MG SL SUBL
0.4000 mg | SUBLINGUAL_TABLET | SUBLINGUAL | Status: DC | PRN
Start: 2021-03-11 — End: 2021-03-13

## 2021-03-11 MED ORDER — IRBESARTAN 150 MG PO TABS
150.0000 mg | ORAL_TABLET | Freq: Every day | ORAL | Status: DC
  Administered 2021-03-11 – 2021-03-12 (×2): 150 mg via ORAL
  Filled 2021-03-11 (×3): qty 1

## 2021-03-11 MED ORDER — ROPINIROLE HCL 1 MG PO TABS
2.0000 mg | ORAL_TABLET | Freq: Every day | ORAL | Status: DC
  Administered 2021-03-11 – 2021-03-12 (×2): 2 mg via ORAL
  Filled 2021-03-11 (×4): qty 2

## 2021-03-11 MED ORDER — CHLORHEXIDINE GLUCONATE CLOTH 2 % EX PADS
6.0000 | MEDICATED_PAD | Freq: Every day | CUTANEOUS | Status: DC
  Administered 2021-03-11 – 2021-03-13 (×3): 6 via TOPICAL

## 2021-03-11 MED ORDER — TIROFIBAN (AGGRASTAT) BOLUS VIA INFUSION
INTRAVENOUS | Status: DC | PRN
  Administered 2021-03-11: 1690 ug via INTRAVENOUS

## 2021-03-11 MED ORDER — OXYCODONE HCL 5 MG PO TABS: 5.0000 mg | ORAL_TABLET | ORAL | Status: DC | PRN

## 2021-03-11 MED ORDER — DICYCLOMINE HCL 10 MG PO CAPS
10.0000 mg | ORAL_CAPSULE | Freq: Three times a day (TID) | ORAL | Status: DC
  Administered 2021-03-11 – 2021-03-12 (×8): 10 mg via ORAL
  Filled 2021-03-11 (×15): qty 1

## 2021-03-11 MED ORDER — HEPARIN SODIUM (PORCINE) 1000 UNIT/ML IJ SOLN
INTRAMUSCULAR | Status: AC
  Filled 2021-03-11: qty 1

## 2021-03-11 MED ORDER — VERAPAMIL HCL 2.5 MG/ML IV SOLN
INTRAVENOUS | Status: DC | PRN
  Administered 2021-03-11: 10 mL via INTRA_ARTERIAL

## 2021-03-11 MED ORDER — MUPIROCIN 2 % EX OINT
1.0000 "application " | TOPICAL_OINTMENT | Freq: Two times a day (BID) | CUTANEOUS | Status: DC
  Administered 2021-03-11 – 2021-03-13 (×5): 1 via NASAL
  Filled 2021-03-11 (×2): qty 22

## 2021-03-11 MED ORDER — HEPARIN (PORCINE) 25000 UT/250ML-% IV SOLN
1300.0000 [IU]/h | INTRAVENOUS | Status: DC
  Administered 2021-03-11: 900 [IU]/h via INTRAVENOUS
  Administered 2021-03-12: 1200 [IU]/h via INTRAVENOUS
  Administered 2021-03-13: 1300 [IU]/h via INTRAVENOUS
  Filled 2021-03-11 (×3): qty 250

## 2021-03-11 MED ORDER — VITAMIN B-12 1000 MCG PO TABS
2000.0000 ug | ORAL_TABLET | Freq: Every day | ORAL | Status: DC
  Administered 2021-03-11 – 2021-03-12 (×2): 2000 ug via ORAL
  Filled 2021-03-11 (×2): qty 2

## 2021-03-11 MED ORDER — ASPIRIN EC 81 MG PO TBEC
81.0000 mg | DELAYED_RELEASE_TABLET | Freq: Every day | ORAL | Status: DC
  Administered 2021-03-11 – 2021-03-12 (×2): 81 mg via ORAL
  Filled 2021-03-11 (×2): qty 1

## 2021-03-11 MED ORDER — SODIUM CHLORIDE 0.9% FLUSH: 3.0000 mL | INTRAVENOUS | Status: DC | PRN

## 2021-03-11 MED ORDER — ISOSORBIDE MONONITRATE ER 30 MG PO TB24
30.0000 mg | ORAL_TABLET | Freq: Every day | ORAL | Status: DC
  Administered 2021-03-11 – 2021-03-12 (×2): 30 mg via ORAL
  Filled 2021-03-11 (×2): qty 1

## 2021-03-11 MED ORDER — TIROFIBAN HCL IN NACL 5-0.9 MG/100ML-% IV SOLN
0.1500 ug/kg/min | INTRAVENOUS | Status: DC
  Administered 2021-03-11 (×3): 0.15 ug/kg/min via INTRAVENOUS
  Filled 2021-03-11 (×2): qty 100

## 2021-03-11 MED ORDER — NITROGLYCERIN IN D5W 200-5 MCG/ML-% IV SOLN
0.0000 ug/min | INTRAVENOUS | Status: DC
  Administered 2021-03-11: 70 ug/min via INTRAVENOUS
  Administered 2021-03-11: 20 ug/min via INTRAVENOUS
  Filled 2021-03-11: qty 250

## 2021-03-11 MED ORDER — GABAPENTIN 300 MG PO CAPS
600.0000 mg | ORAL_CAPSULE | Freq: Two times a day (BID) | ORAL | Status: DC
  Administered 2021-03-11 – 2021-03-12 (×4): 600 mg via ORAL
  Filled 2021-03-11 (×4): qty 2

## 2021-03-11 MED ORDER — AMLODIPINE BESYLATE 5 MG PO TABS
5.0000 mg | ORAL_TABLET | Freq: Every day | ORAL | Status: DC
  Administered 2021-03-11 – 2021-03-12 (×2): 5 mg via ORAL
  Filled 2021-03-11 (×2): qty 1

## 2021-03-11 MED ORDER — SODIUM CHLORIDE 0.9 % IV SOLN
250.0000 mL | INTRAVENOUS | Status: DC | PRN
  Administered 2021-03-13: 250 mL via INTRAVENOUS

## 2021-03-11 MED ORDER — OLANZAPINE 10 MG PO TABS: 10.0000 mg | ORAL_TABLET | Freq: Every day | ORAL | Status: DC

## 2021-03-11 MED ORDER — HYDRALAZINE HCL 20 MG/ML IJ SOLN: 10.0000 mg | INTRAMUSCULAR | Status: AC | PRN

## 2021-03-11 MED ORDER — SODIUM CHLORIDE 0.9 % IV SOLN: INTRAVENOUS | Status: AC

## 2021-03-11 MED ORDER — METOPROLOL SUCCINATE ER 50 MG PO TB24: 50.0000 mg | ORAL_TABLET | Freq: Every day | ORAL | Status: DC

## 2021-03-11 MED ORDER — POTASSIUM CHLORIDE CRYS ER 20 MEQ PO TBCR
40.0000 meq | EXTENDED_RELEASE_TABLET | ORAL | Status: AC
Start: 2021-03-11 — End: 2021-03-11
  Administered 2021-03-11 (×2): 40 meq via ORAL
  Filled 2021-03-11 (×2): qty 2

## 2021-03-11 MED ORDER — PANCRELIPASE (LIP-PROT-AMYL) 36000-114000 UNITS PO CPEP
36000.0000 [IU] | ORAL_CAPSULE | Freq: Three times a day (TID) | ORAL | Status: DC
  Administered 2021-03-11 – 2021-03-12 (×6): 36000 [IU] via ORAL
  Filled 2021-03-11 (×10): qty 1

## 2021-03-11 MED ORDER — MAGNESIUM SULFATE 4 GM/100ML IV SOLN
4.0000 g | Freq: Once | INTRAVENOUS | Status: AC
  Administered 2021-03-11: 4 g via INTRAVENOUS
  Filled 2021-03-11: qty 100

## 2021-03-11 MED ORDER — ASPIRIN 81 MG PO CHEW: 81.0000 mg | CHEWABLE_TABLET | Freq: Every day | ORAL | Status: DC

## 2021-03-11 MED ORDER — MIDAZOLAM HCL 2 MG/2ML IJ SOLN
INTRAMUSCULAR | Status: DC | PRN
  Administered 2021-03-11: 1 mg via INTRAVENOUS

## 2021-03-11 MED ORDER — HEPARIN SODIUM (PORCINE) 1000 UNIT/ML IJ SOLN
INTRAMUSCULAR | Status: DC | PRN
  Administered 2021-03-11: 3000 [IU] via INTRAVENOUS

## 2021-03-11 MED ORDER — SODIUM CHLORIDE 0.9% FLUSH
3.0000 mL | Freq: Two times a day (BID) | INTRAVENOUS | Status: DC
  Administered 2021-03-11 – 2021-03-12 (×4): 3 mL via INTRAVENOUS

## 2021-03-11 MED ORDER — ONDANSETRON HCL 4 MG/2ML IJ SOLN: 4.0000 mg | Freq: Four times a day (QID) | INTRAMUSCULAR | Status: DC | PRN

## 2021-03-11 MED ORDER — ATORVASTATIN CALCIUM 80 MG PO TABS
80.0000 mg | ORAL_TABLET | Freq: Every day | ORAL | Status: DC
  Administered 2021-03-11 – 2021-03-19 (×8): 80 mg via ORAL
  Filled 2021-03-11 (×8): qty 1

## 2021-03-11 MED ORDER — IOHEXOL 350 MG/ML SOLN
INTRAVENOUS | Status: DC | PRN
  Administered 2021-03-11: 65 mL

## 2021-03-11 MED ORDER — FLUOXETINE HCL 20 MG PO CAPS
60.0000 mg | ORAL_CAPSULE | Freq: Every day | ORAL | Status: DC
  Administered 2021-03-11 – 2021-03-12 (×2): 60 mg via ORAL
  Filled 2021-03-11 (×2): qty 3

## 2021-03-11 MED ORDER — FENTANYL CITRATE (PF) 100 MCG/2ML IJ SOLN
INTRAMUSCULAR | Status: DC | PRN
  Administered 2021-03-11: 25 ug via INTRAVENOUS

## 2021-03-11 MED ORDER — PANTOPRAZOLE SODIUM 40 MG PO TBEC
40.0000 mg | DELAYED_RELEASE_TABLET | Freq: Every day | ORAL | Status: DC
  Administered 2021-03-11 – 2021-03-12 (×2): 40 mg via ORAL
  Filled 2021-03-11 (×2): qty 1

## 2021-03-11 MED ORDER — ENOXAPARIN SODIUM 40 MG/0.4ML IJ SOSY: 40.0000 mg | PREFILLED_SYRINGE | INTRAMUSCULAR | Status: DC

## 2021-03-11 MED ORDER — LABETALOL HCL 5 MG/ML IV SOLN
10.0000 mg | INTRAVENOUS | Status: AC | PRN
  Filled 2021-03-11: qty 4

## 2021-03-11 MED ORDER — ATORVASTATIN CALCIUM 80 MG PO TABS: 80.0000 mg | ORAL_TABLET | Freq: Every day | ORAL | Status: DC

## 2021-03-11 MED ORDER — METOPROLOL SUCCINATE ER 50 MG PO TB24
75.0000 mg | ORAL_TABLET | Freq: Every day | ORAL | Status: DC
  Administered 2021-03-11 – 2021-03-13 (×3): 75 mg via ORAL
  Filled 2021-03-11 (×3): qty 1

## 2021-03-11 MED ORDER — ALBUTEROL SULFATE (2.5 MG/3ML) 0.083% IN NEBU: 2.5000 mg | INHALATION_SOLUTION | Freq: Four times a day (QID) | RESPIRATORY_TRACT | Status: DC | PRN

## 2021-03-11 MED ORDER — TIROFIBAN HCL IN NACL 5-0.9 MG/100ML-% IV SOLN
INTRAVENOUS | Status: AC
  Filled 2021-03-11: qty 100

## 2021-03-11 MED ORDER — FLUTICASONE FUROATE-VILANTEROL 100-25 MCG/INH IN AEPB
1.0000 | INHALATION_SPRAY | Freq: Every day | RESPIRATORY_TRACT | Status: DC
  Administered 2021-03-11 – 2021-03-13 (×3): 1 via RESPIRATORY_TRACT
  Filled 2021-03-11: qty 28

## 2021-03-11 NOTE — CV Procedure (Addendum)
99% ostial circumflex; first obtuse marginal ostial 80% 40 to 50% distal left main Eccentric 50% mid LAD with branching first diagonal 50%. Right coronary is relatively small contains a patent stent. Severe lateral wall hypokinesis.  EF 40 to 50%.  LVEDP 24 mmHg.  Should consider surgery.  We will contact T CTS.  PCI possible but high risk, involve LM, and probably not durable over time in such a young patient.  Surgical option would be best long-term treatment in my opinion.

## 2021-03-11 NOTE — Progress Notes (Signed)
Progress Note  Patient Name: [name] Date of Encounter: 03/11/2021  Primary Cardiologist: Dr. Zandra Abts  Subjective   No recurrent chest pain; had a burst of NSVT  Inpatient Medications    Scheduled Meds:  amLODipine  5 mg Oral Daily   aspirin EC  81 mg Oral Daily   atorvastatin  80 mg Oral Daily   Chlorhexidine Gluconate Cloth  6 each Topical Daily   dicyclomine  10 mg Oral TID AC & HS   FLUoxetine  60 mg Oral Daily   fluticasone furoate-vilanterol  1 puff Inhalation Daily   gabapentin  600 mg Oral BID   irbesartan  150 mg Oral Daily   isosorbide mononitrate  30 mg Oral Daily   lipase/protease/amylase  36,000 Units Oral TID WC   metoprolol succinate  50 mg Oral Daily   mupirocin ointment  1 application Nasal BID   pantoprazole  40 mg Oral Daily   rOPINIRole  2 mg Oral QHS   sodium chloride flush  3 mL Intravenous Q12H   vitamin B-12  2,000 mcg Oral Daily   Continuous Infusions:  sodium chloride     heparin     nitroGLYCERIN 40 mcg/min (03/11/21 0700)   tirofiban 0.15 mcg/kg/min (03/11/21 0700)   PRN Meds: sodium chloride, acetaminophen, albuterol, nitroGLYCERIN, ondansetron (ZOFRAN) IV, oxyCODONE, sodium chloride flush   Vital Signs    Vitals:   03/11/21 0630 03/11/21 0645 03/11/21 0700 03/11/21 0804  BP: (!) 137/98 (!) 146/99 (!) 132/94   Pulse: 69 70 73   Resp: 19 (!) 22 19   Temp:      TempSrc:      SpO2: 94% 95% 93% 95%  Weight:      Height:        Intake/Output Summary (Last 24 hours) at 03/11/2021 0811 Last data filed at 03/11/2021 0700 Gross per 24 hour  Intake 802.75 ml  Output 600 ml  Net 202.75 ml    I/O since admission: [place] Weights   03/10/21 2044 03/10/21 2050 03/11/21 0500  Weight: 67.6 kg 67.6 kg 67.9 kg    Telemetry    Currently sinus at 76 - Personally Reviewed  At 07:28 this am had burst of NSVT  ECG    ECG (independently read by me): NSR at 72, T wave inversion I,aVL  Physical Exam    BP (!) 132/94    Pulse 73   Temp 97.9 F (36.6 C) (Oral)   Resp 19   Ht 5\' 10"  (1.778 m)   Wt 67.9 kg   SpO2 95%   BMI 21.48 kg/m  General: Alert, oriented, no distress.  Skin: normal turgor, no rashes, warm and dry HEENT: Normocephalic, atraumatic. Pupils equal round and reactive to light; sclera anicteric; extraocular muscles intact;  Nose without nasal septal hypertrophy Mouth/Parynx benign; Neck: No JVD, no carotid bruits; normal carotid upstroke Lungs: clear to ausculatation and percussion; no wheezing or rales Chest wall: without tenderness to palpitation Heart: PMI not displaced, RRR, s1 s2 normal, 1/6 systolic murmur, no diastolic murmur, no rubs, gallops, thrills, or heaves Abdomen: soft, nontender; no hepatosplenomehaly, BS+; abdominal aorta nontender and not dilated by palpation. Back: no CVA tenderness Pulses 2+L R radial  TR band still in place Musculoskeletal: full range of motion, normal strength, no joint deformities Extremities: no clubbing cyanosis or edema, Homan's sign negative  Neurologic: grossly nonfocal; Cranial nerves grossly wnl Psychologic: Normal mood and affect   Labs    Chemistry Recent  Labs  Lab 03/10/21 2058 03/11/21 0109  NA 139 137  K 3.7 3.2*  CL 103 105  CO2 28 23  GLUCOSE 115* 139*  BUN 10 8  CREATININE 0.83 0.74  CALCIUM 8.9 8.4*  PROT  --  5.4*  ALBUMIN  --  3.2*  AST  --  57*  ALT  --  40  ALKPHOS  --  84  BILITOT  --  0.5  GFRNONAA >60 >60  ANIONGAP 8 9     Hematology Recent Labs  Lab 03/10/21 2058 03/11/21 0109  WBC 8.9 9.1  RBC 4.54 4.16*  HGB 15.4 13.7  HCT 43.2 38.7*  MCV 95.2 93.0  MCH 33.9 32.9  MCHC 35.6 35.4  RDW 12.9 12.8  PLT 142* 132*    Mg: 1.5  HS Trop: 2422 > 3242  Cardiac EnzymesNo results for input(s): TROPONINI in the last 168 hours. No results for input(s): TROPIPOC in the last 168 hours.   BNP Recent Labs  Lab 03/11/21 0109  BNP 341.6*     DDimer No results for input(s): DDIMER in the last 168  hours.   Lipid Panel     Component Value Date/Time   CHOL 85 03/11/2021 0109   CHOL 138 12/13/2017 1603   TRIG 82 03/11/2021 0109   HDL 42 03/11/2021 0109   HDL 34 (L) 12/13/2017 1603   CHOLHDL 2.0 03/11/2021 0109   VLDL 16 03/11/2021 0109   LDLCALC 27 03/11/2021 0109   LDLCALC 43 12/13/2017 1603   LDLDIRECT 51.0 08/29/2019 0236     Radiology    CARDIAC CATHETERIZATION  Result Date: 03/11/2021 CONCLUSIONS: Acute coronary syndrome with ostial 99% of circumflex progression.  First marginal 70%. 40 to 50% distal left main 40 to 50% mid LAD with 40% first diagonal Right coronary with diffuse 30 to 40% in-stent restenosis proximal and eccentric 50 to 60% mid to distal stenosis. LV systolic dysfunction with regional wall motion abnormality, and elevated LVEDP of 22 mmHg.  Findings are consistent with acute on chronic combined systolic and diastolic heart failure. RECOMMENDATIONS: IV Aggrastat IV heparin IV nitro Notify Dr. Kipp Brood of patient's presence and requested consideration of surgical revascularization.  Could also discuss other treatment options which could include circumflex stent with extension into the left main however it would gel the LAD.  Left main stenting would not be an optimal long-term strategy for this patient given his age of 45. Was not loaded with P2 Y 12 agent.   DG Chest Portable 1 View  Result Date: 03/10/2021 CLINICAL DATA:  Central chest pain radiating to the bilateral upper extremities, short of breath, dizziness EXAM: PORTABLE CHEST 1 VIEW COMPARISON:  08/28/2019 FINDINGS: Two frontal views of the chest demonstrate an unremarkable cardiac silhouette. No acute airspace disease, effusion, or pneumothorax. No acute bony abnormalities. IMPRESSION: 1. Stable chest, no acute process. Electronically Signed   By: Randa Ngo M.D.   On: 03/10/2021 21:17    Cardiac Studies   CONCLUSIONS: Acute coronary syndrome with ostial 99% of circumflex progression.  First  marginal 70%. 40 to 50% distal left main 40 to 50% mid LAD with 40% first diagonal Right coronary with diffuse 30 to 40% in-stent restenosis proximal and eccentric 50 to 60% mid to distal stenosis. LV systolic dysfunction with regional wall motion abnormality, and elevated LVEDP of 22 mmHg.  Findings are consistent with acute on chronic combined systolic and diastolic heart failure.   RECOMMENDATIONS:   IV Aggrastat IV heparin IV nitro Notify  Dr. Kipp Brood of patient's presence and requested consideration of surgical revascularization.  Could also discuss other treatment options which could include circumflex stent with extension into the left main however it would gel the LAD.  Left main stenting would not be an optimal long-term strategy for this patient given his age of 89. Was not loaded with P2 Y 12 agent.  Intervention   Patient Profile     NUMA SCHROETER is a 65 y.o. male with coronary disease, h/o PCI with a bare-metal stent to the RCA in 2013 and residual obstructive disease of an OM on catheterization in 2021, COPD, ongoing smoking, prior gastrointestinal bleeding and H. pylori, hypertension who presented on 03/11/2021 for the evaluation of STEMI.  Assessment & Plan    Acute coronary syndrome: Patient developed recurrent chest pain the evening of March 09, 2021.  Chest pain recurred yesterday and became more intense last evening resulting in his presentation.  I reviewed the angiograms.  There is severe multivessel CAD with subtotal ostial stenosis of a large left circumflex vessel.  With his concomitant CAD agree with recommendation for CABG revascularization.  We will obtain surgical consultation.  Presently continue Aggrastat for 18 hours with heparin and then continue heparin once Aggrastat is discontinued.  We will continue intravenous nitroglycerin drip and beta-blocker therapy.  He is on aspirin.,  P2 Y12 was not instituted due to need for CABG revascularization.   Nonsustained ventricular tachycardia: This morning patient had approximately 16 beat episode of nonsustained VT at a rate of approximately 150 bpm.  Currently is on metoprolol succinate 50 mg.  With his resting pulse at 75 and blood pressure 132/90 we will increase metoprolol succinate to 75 mg daily. Electrolyte abnormality: Magnesium 1.5 and potassium 3.2.  Will replete magnesium with 4 g intravenously over  2 hours and replete potassium 40 mill equivalents x2 4 hours apart. Hyperlipidemia: Continue high potency statin therapy with target LDL under 70 and preferably in the 50s.  LDL this morning 27. Tobacco abuse: Patient started smoking at age 17 and has been and has been smoking for 50 years.  He continues to smoke after his initial stent to his RCA in 2013.  I discussed the importance of complete smoking cessation. Mildly reduced platelets at 132, follow closely.  Signed, Troy Sine, MD, [facility] 03/11/2021, 8:11 AM

## 2021-03-11 NOTE — Consult Note (Signed)
[address]       [place] [address]             [phone]        Zakariye D Friedmann [facility] Medical Record #[id number] Date of Birth: [birth year]  Referring: No ref. provider found Primary Care: [name], MD Primary Cardiologist:Branch, Roderic Palau, MD  Chief Complaint:    Chief Complaint  Patient presents with   Chest Pain    History of Present Illness:     65 yo male with Hx of CAD s/p PCI present with unstable angina.  He was ruled in for STEMI and taken to the cath lab where he was noted to have three-vessel coronary artery disease and in-stent restenosis.  He was not deemed a good candidate for repeat PCI.  CTS was consulted to assist with management.  He was placed on an Aggrastat drip and has been comfortable.   Past Medical and Surgical History: Previous Chest Surgery: No Previous Chest Radiation: No Diabetes Mellitus: No.  HbA1C pending Creatinine: 0.74  Past Medical History:  Diagnosis Date   Anxiety    Anxiety and depression    ASCVD (arteriosclerotic cardiovascular disease)    a. s/p PTCA to LCx in 1993 b. BMS to RCA in 01/2012 with residual 80% OM2 stenosis and medical management recommended c. 08/2019: cath showing patent stents with occluded OM2 --> medical management.    Asthma    CAD (coronary artery disease)    Carcinoma in situ of colon 2004   rectal polyp   Colitis, ischemic ([place]) 2011   COPD (chronic obstructive pulmonary disease) (HCC)    mild ;excercise induced hypoxemia by cp stress test ;asthma ,bronchitis,   Depression    Diverticulosis    Gastritis 12/30/10   EGD Dr [name]   GERD (gastroesophageal reflux disease)    GI bleed    H. 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)    Leukocytosis    [name]   Lung nodule 07/16/2011   Myocardial infarction [facility])    age 86   Restless leg  syndrome    Schatzki's ring    Sleep apnea    does not use CPAP:cannot tolerate, PCP aware   Syncope    Tobacco abuse    50 pack years continuing at one halp pack daily   Tubular adenoma of colon 06/2009   Colonosocpy Dr [name]    Past Surgical History:  Procedure Laterality Date   BIOPSY  12/19/2017   Procedure: BIOPSY;  Surgeon: [name], MD;  [facility];  Service: Endoscopy;;  gastric   CARDIAC CATHETERIZATION     CHOLECYSTECTOMY  2004   COLONOSCOPY  06/2009   normal terminal ileum, segmental mild inflammation of sigmoid colon (bx unremarkable), polyp, tubular adenoma   COLONOSCOPY N/A 07/31/2013   Dr.Rourk- redundant anal canal hemorrhoids, colonic diverticulosis, tubular adenoma   COLONOSCOPY W/ POLYPECTOMY  2004   rectal polyp with carcinoma in situ removed via colonoscopy   COLONOSCOPY WITH PROPOFOL N/A 09/15/2015   Dr. [name]: Scattered diverticula throughout the colon, 2 sessile polyps found in the descending colon and cecum, 5 mm in size.  Cecal polyp was sessile serrated polyp, descending colon polyp was a tubular adenoma.  He had a abnormal perianal exam along with grade 3 hemorrhoids.  Surveillance exam recommended for 5-year follow-up.   COLONOSCOPY WITH PROPOFOL N/A 12/19/2017   one 5 mm polyp in ascending colon and 1 cm sessile polyp in ascending s/p removal. Tubular adenoma. internal hemorrhoids   CORONARY ANGIOPLASTY WITH STENT PLACEMENT  01/26/2012   "1; total is now 2"   CORONARY/GRAFT ACUTE MI REVASCULARIZATION N/A 03/10/2021   Procedure: Coronary/Graft Acute MI Revascularization;  Surgeon: Belva Crome, MD;  Location: [place] [facility];  Service: Cardiovascular;  Laterality: N/A;   ESOPHAGOGASTRODUODENOSCOPY  02/2009   query Barrett's but bx negative   ESOPHAGOGASTRODUODENOSCOPY  12/30/2010   [name],gastritis, dilated 66F, sm HH, 1 small ulcer, Duodenal erosions, benign bx   ESOPHAGOGASTRODUODENOSCOPY (EGD) WITH ESOPHAGEAL DILATION N/A 09/12/2012    BOF:BPZWCHENIDP Schatzki's ring s/p Maloney dilator. Small hiatal hernia. negative path   ESOPHAGOGASTRODUODENOSCOPY (EGD) WITH ESOPHAGEAL DILATION N/A 07/31/2013   Dr. [name]- normal egd, s/p [facility] dilation empirically. Normal small bowel biopsies    ESOPHAGOGASTRODUODENOSCOPY (EGD) WITH PROPOFOL N/A 09/15/2015   Dr. [name]: Medium sized hiatal hernia, normal-appearing esophagus status post empiric dilation   ESOPHAGOGASTRODUODENOSCOPY (EGD) WITH PROPOFOL N/A 12/19/2017   erosive esophagitis s/p dilation, erosive gastropathy, normal duodenum   ESOPHAGOGASTRODUODENOSCOPY (EGD) WITH PROPOFOL N/A 09/17/2019   Non-obstructing Schatzki's ring s/p dilation, medium-sized hiatal hernia, otherwise normal.   FLEXIBLE SIGMOIDOSCOPY  12/30/2010    [name],; internal hemorrhoids, anal papilla   HAND SURGERY     surgical intervention for injury of the fingers of the left hand many years ago   heart stent     HEMORRHOID BANDING     Dr. [name]   LEFT HEART CATH AND CORONARY ANGIOGRAPHY N/A 08/29/2019   Procedure: LEFT HEART CATH AND CORONARY ANGIOGRAPHY;  Surgeon: [name], MD;  Location: [place] [facility];  Service: Cardiovascular;  Laterality: N/A;   LEFT HEART CATH AND CORONARY ANGIOGRAPHY N/A 03/10/2021   Procedure: LEFT HEART CATH AND CORONARY ANGIOGRAPHY;  Surgeon: Belva Crome, MD;  Location: [place] [facility];  Service: Cardiovascular;  Laterality: N/A;   LEFT HEART CATHETERIZATION WITH CORONARY ANGIOGRAM N/A 01/26/2012   Procedure: LEFT HEART CATHETERIZATION WITH CORONARY ANGIOGRAM;  Surgeon: [name], MD;  Location: [facility] CATH LAB;  Service: Cardiovascular;  Laterality: N/A;   MALONEY DILATION N/A 09/15/2015   Procedure: [name] DILATION;  Surgeon: [name], MD;  [facility];  Service: Endoscopy;  Laterality: N/A;   MALONEY DILATION N/A 12/19/2017   Procedure: [name] DILATION;  Surgeon: [name], MD;  [facility];  Service: Endoscopy;  Laterality:  N/A;   MALONEY DILATION N/A 09/17/2019   Procedure: [name] DILATION;  Surgeon: [name], MD;  [facility];  Service: Endoscopy;  Laterality: N/A;   NASAL SEPTOPLASTY W/ TURBINOPLASTY  10/06/2011   Procedure: NASAL SEPTOPLASTY WITH TURBINATE REDUCTION;  Surgeon: Izora Gala, MD;  Location: [facility];  Service: ENT;  Laterality: Bilateral;   PERCUTANEOUS CORONARY STENT INTERVENTION (PCI-S) N/A 01/26/2012   Procedure: PERCUTANEOUS CORONARY STENT INTERVENTION (PCI-S);  Surgeon: [name], MD;  Location: [facility] CATH LAB;  Service: Cardiovascular;  Laterality: N/A;   POLYPECTOMY  09/15/2015   Procedure: POLYPECTOMY;  Surgeon: [name], MD;  [facility];  Service: Endoscopy;;  Cecal polyp removed via cold snare/ Descending colon polyp removed via cold snare   POLYPECTOMY  12/19/2017   Procedure: POLYPECTOMY;  Surgeon: [name], MD;  [facility];  Service: Endoscopy;;  colon  Social History   Tobacco Use  Smoking Status Every Day   Packs/day: 1.00   Years: 48.00   Pack years: 48.00   Types: Cigarettes   Start date: 07/31/1969  Smokeless Tobacco Former    Social History   Substance and Sexual Activity  Alcohol Use Yes   Comment: Drinks a beer occasionally     No Known Allergies   Current Facility-Administered Medications  Medication Dose Route Frequency Provider Last Rate Last Admin   0.9 %  sodium chloride infusion  250 mL Intravenous PRN Belva Crome, MD       acetaminophen (TYLENOL) tablet 650 mg  650 mg Oral Q4H PRN Nipp, Carriel T, MD       albuterol (PROVENTIL) (2.5 MG/3ML) 0.083% nebulizer solution 2.5 mg  2.5 mg Nebulization Q6H PRN Nipp, Carriel T, MD       amLODipine (NORVASC) tablet 5 mg  5 mg Oral Daily Nipp, Carriel T, MD   5 mg at 03/11/21 1052   aspirin EC tablet 81 mg  81 mg Oral Daily Nipp, Carriel T, MD   81 mg at 03/11/21 1052   atorvastatin (LIPITOR) tablet 80 mg  80 mg Oral Daily Nipp, Carriel T, MD   80 mg at  03/11/21 1052   Chlorhexidine Gluconate Cloth 2 % PADS 6 each  6 each Topical Daily Belva Crome, MD   6 each at 03/11/21 1053   dicyclomine (BENTYL) capsule 10 mg  10 mg Oral TID AC & HS Nipp, Carriel T, MD   10 mg at 03/11/21 1351   FLUoxetine (PROZAC) capsule 60 mg  60 mg Oral Daily Nipp, Carriel T, MD   60 mg at 03/11/21 1052   fluticasone furoate-vilanterol (BREO ELLIPTA) 100-25 MCG/INH 1 puff  1 puff Inhalation Daily Nipp, Carriel T, MD   1 puff at 03/11/21 0802   gabapentin (NEURONTIN) capsule 600 mg  600 mg Oral BID Nipp, Carriel T, MD   600 mg at 03/11/21 1052   heparin ADULT infusion 100 units/mL (25000 units/260mL)  900 Units/hr Intravenous Continuous Erenest Blank, RPH 9 mL/hr at 03/11/21 0847 900 Units/hr at 03/11/21 0847   irbesartan (AVAPRO) tablet 150 mg  150 mg Oral Daily Nipp, Carriel T, MD   150 mg at 03/11/21 1052   isosorbide mononitrate (IMDUR) 24 hr tablet 30 mg  30 mg Oral Daily Nipp, Carriel T, MD   30 mg at 03/11/21 1052   lipase/protease/amylase (CREON) capsule 36,000 Units  36,000 Units Oral TID WC Nipp, Carriel T, MD   36,000 Units at 03/11/21 1351   metoprolol succinate (TOPROL-XL) 24 hr tablet 75 mg  75 mg Oral Daily Troy Sine, MD   75 mg at 03/11/21 1052   mupirocin ointment (BACTROBAN) 2 % 1 application  1 application Nasal BID Belva Crome, MD   1 application at 14/48/18 1053   nitroGLYCERIN (NITROSTAT) SL tablet 0.4 mg  0.4 mg Sublingual Q5 Min x 3 PRN Nipp, Carriel T, MD       nitroGLYCERIN 50 mg in dextrose 5 % 250 mL (0.2 mg/mL) infusion  0-200 mcg/min Intravenous Titrated Belva Crome, MD 12 mL/hr at 03/11/21 0700 40 mcg/min at 03/11/21 0700   ondansetron (ZOFRAN) injection 4 mg  4 mg Intravenous Q6H PRN Nipp, Carriel T, MD       oxyCODONE (Oxy IR/ROXICODONE) immediate release tablet 5-10 mg  5-10 mg Oral Q4H PRN Belva Crome, MD       pantoprazole (  PROTONIX) EC tablet 40 mg  40 mg Oral Daily Nipp, Carriel T, MD   40 mg at 03/11/21 1052    rOPINIRole (REQUIP) tablet 2 mg  2 mg Oral QHS Nipp, Carriel T, MD       sodium chloride flush (NS) 0.9 % injection 3 mL  3 mL Intravenous Q12H Belva Crome, MD   3 mL at 03/11/21 0509   sodium chloride flush (NS) 0.9 % injection 3 mL  3 mL Intravenous PRN Belva Crome, MD       tirofiban (AGGRASTAT) infusion 50 mcg/mL 100 mL  0.15 mcg/kg/min Intravenous Continuous Erenest Blank, RPH 12.17 mL/hr at 03/11/21 1214 0.15 mcg/kg/min at 03/11/21 1214   vitamin B-12 (CYANOCOBALAMIN) tablet 2,000 mcg  2,000 mcg Oral Daily Nipp, Carriel T, MD   2,000 mcg at 03/11/21 1052    Medications Prior to Admission  Medication Sig Dispense Refill Last Dose   albuterol (VENTOLIN HFA) 108 (90 Base) MCG/ACT inhaler Inhale 2 puffs into the lungs every 4 (four) hours as needed for wheezing or shortness of breath. 18 g 1 03/11/2021   ALPRAZolam (XANAX) 1 MG tablet TAKE 1 TABLET BY MOUTH THREE TIMES A DAY AS NEEDED FOR ANXIETY (Patient taking differently: Take 1 mg by mouth 3 (three) times daily as needed for anxiety.) 90 tablet 1 Past Week   amLODipine (NORVASC) 5 MG tablet Take 5 mg by mouth daily.   Past Week   aspirin EC 81 MG tablet Take 81 mg by mouth daily.   03/10/2021   atorvastatin (LIPITOR) 80 MG tablet TAKE 1 TABLET BY MOUTH EVERY DAY (Patient taking differently: Take 80 mg by mouth daily.) 90 tablet 3 Past Week   budesonide-formoterol (SYMBICORT) 80-4.5 MCG/ACT inhaler Take 2 puffs first thing in am and then another 2 puffs about 12 hours later. (Patient taking differently: Inhale 2 puffs into the lungs in the morning and at bedtime.) 1 Inhaler 12 Past Week   CREON 36000-114000 units CPEP capsule TAKE 2 CAPSULES BY MOUTH THREE TIMES DAILY WITH MEALS. 1 CAPSULE WITH SNACKS (Patient taking differently: Take 36,000-72,000 Units by mouth See admin instructions. 72,000 units (2 capsules) three times daily with meals and 36,000 units (1 capsule) with snacks) 240 capsule 3 03/10/2021   cyanocobalamin 2000 MCG tablet  Take 2,000 mcg by mouth daily.   03/10/2021   dicyclomine (BENTYL) 10 MG capsule TAKE 1 CAPSULE (10 MG TOTAL) BY MOUTH 4 (FOUR) TIMES DAILY - BEFORE MEALS AND AT BEDTIME. (Patient taking differently: Take 10 mg by mouth 4 (four) times daily -  before meals and at bedtime.) 360 capsule 1 Past Week   FLUoxetine (PROZAC) 20 MG capsule Take 1 capsule (20 mg total) by mouth daily. (Patient taking differently: Take 20 mg by mouth daily. Take with 40mg  for a total daily dose of 60mg ) 90 capsule 2 Past Week   FLUoxetine (PROZAC) 40 MG capsule Take 1 capsule (40 mg total) by mouth daily. Take with 20 mg to equal 60 mg daily (Patient taking differently: Take 40 mg by mouth daily. Take with 20mg  for a total daily dose of 60mg ) 90 capsule 2 Past Week   fluticasone (FLONASE) 50 MCG/ACT nasal spray Place 2 sprays into both nostrils daily. (Patient taking differently: Place 2 sprays into both nostrils daily as needed for allergies.) 16 g 5 unk   gabapentin (NEURONTIN) 300 MG capsule Take 2 capsules (600 mg total) by mouth 2 (two) times daily. 480 capsule 2 03/10/2021  HYDROcodone-acetaminophen (NORCO) 7.5-325 MG tablet Take one tablet by mouth 2 (two) times daily as needed for moderate pain 60 tablet 0 03/10/2021   isosorbide mononitrate (IMDUR) 30 MG 24 hr tablet Take 1 tablet (30 mg total) by mouth daily. 90 tablet 3 03/10/2021   meloxicam (MOBIC) 15 MG tablet TAKE 1 TABLET BY MOUTH EVERY DAY (Patient taking differently: Take 15 mg by mouth daily.) 30 tablet 5 Past Week   metoprolol succinate (TOPROL-XL) 50 MG 24 hr tablet TAKE 1 TABLET (50 MG TOTAL) BY MOUTH DAILY. PLEASE SCHEDULE FOLLOW UP APPOINTMENT. THANK YOU (Patient taking differently: Take 50 mg by mouth daily.) 30 tablet 2 03/10/2021 at 0600   naloxone [facility]) nasal spray 4 mg/0.1 mL Use as directed (Patient taking differently: Place 0.4 mg into the nose as needed (opiod reversal).) 1 each 0 unk   nitroGLYCERIN (NITROSTAT) 0.4 MG SL tablet Place 1 tablet (0.4 mg  total) under the tongue every 5 (five) minutes as needed for chest pain. 20 tablet 5 03/10/2021   OLANZapine (ZYPREXA) 10 MG tablet Take 1 tablet (10 mg total) by mouth at bedtime. 90 tablet 2 Past Week   pantoprazole (PROTONIX) 40 MG tablet TAKE 1 TABLET BY MOUTH TWICE A DAY BEFORE A MEAL (Patient taking differently: Take 40 mg by mouth 2 (two) times daily before a meal.) 180 tablet 3 Past Week   rOPINIRole (REQUIP) 2 MG tablet TAKE 1 TABLET BY MOUTH EVERYDAY AT BEDTIME (Patient taking differently: Take 2 mg by mouth at bedtime.) 90 tablet 1 Past Week   telmisartan (MICARDIS) 40 MG tablet Take 1 tablet (40 mg total) by mouth daily. 30 tablet 11 Past Week   traZODone (DESYREL) 50 MG tablet TAKE 1 TABLET BY MOUTH EVERYDAY AT BEDTIME (Patient taking differently: Take 50 mg by mouth at bedtime.) 90 tablet 1 Past Week   albuterol (PROVENTIL) (2.5 MG/3ML) 0.083% nebulizer solution Take 3 mLs (2.5 mg total) by nebulization every 6 (six) hours as needed for wheezing or shortness of breath. (Patient not taking: No sig reported) 75 mL 1 Not Taking    Family History  Problem Relation Age of Onset   Lung disease Father        deceased, black lung   Heart disease Mother        blood clots   Depression Mother    Cancer Paternal Uncle        unknown type   Cancer Maternal Aunt        unknown type   Kidney failure Maternal Uncle    Hypertension Brother    Colon cancer Neg Hx    ADD / ADHD Neg Hx    Alcohol abuse Neg Hx    Drug abuse Neg Hx    Anxiety disorder Neg Hx    Bipolar disorder Neg Hx    Dementia Neg Hx    OCD Neg Hx    Paranoid behavior Neg Hx    Schizophrenia Neg Hx    Physical abuse Neg Hx    Sexual abuse Neg Hx    Seizures Neg Hx      Review of Systems:   Review of Systems  Constitutional: Negative.   Respiratory:  Positive for shortness of breath.   Cardiovascular:  Positive for chest pain. Negative for palpitations and orthopnea.  Neurological: Negative.      Physical  Exam: BP (!) 129/95   Pulse 70   Temp 98.2 F (36.8 C) (Oral)   Resp 20   Ht 5'  10" (1.778 m)   Wt 67.9 kg   SpO2 96%   BMI 21.48 kg/m  Physical Exam Constitutional:      General: He is not in acute distress.    Appearance: He is normal weight. He is not ill-appearing.  Cardiovascular:     Rate and Rhythm: Regular rhythm.  Pulmonary:     Effort: Pulmonary effort is normal.  Abdominal:     Palpations: Abdomen is soft.  Musculoskeletal:        General: Normal range of motion.     Cervical back: Normal range of motion.  Skin:    General: Skin is warm and dry.  Neurological:     Mental Status: He is alert.      Diagnostic Studies & Laboratory data:    Left Heart Catherization: CONCLUSIONS: Acute coronary syndrome with ostial 99% of circumflex progression.  First marginal 70%. 40 to 50% distal left main 40 to 50% mid LAD with 40% first diagonal Right coronary with diffuse 30 to 40% in-stent restenosis proximal and eccentric 50 to 60% mid to distal stenosis. LV systolic dysfunction with regional wall motion abnormality, and elevated LVEDP of 22 mmHg.  Findings are consistent with acute on chronic combined systolic and diastolic heart failure. Echo: IMPRESSIONS     1. Inferior and septal hypokinesis . Left ventricular ejection fraction,  by estimation, is 45 to 50%. The left ventricle has mildly decreased  function. The left ventricle has no regional wall motion abnormalities.  The left ventricular internal cavity  size was mildly dilated. Left ventricular diastolic parameters were  normal. The average left ventricular global longitudinal strain is -12.2  %. The global longitudinal strain is abnormal.   2. Right ventricular systolic function is normal. The right ventricular  size is normal.   3. Left atrial size was mildly dilated.   4. The mitral valve is abnormal. Mild to moderate mitral valve  regurgitation. No evidence of mitral stenosis.   5. The aortic valve is  tricuspid. Aortic valve regurgitation is mild.  Mild aortic valve sclerosis is present, with no evidence of aortic valve  stenosis.   6. The inferior vena cava is normal in size with greater than 50%  respiratory variability, suggesting right atrial pressure of 3 mmHg.  EKG: Normal sinus rhythm I have independently reviewed the above radiologic studies and discussed with the patient   Recent Lab Findings: Lab Results  Component Value Date   WBC 9.1 03/11/2021   HGB 13.7 03/11/2021   HCT 38.7 (L) 03/11/2021   PLT 132 (L) 03/11/2021   GLUCOSE 139 (H) 03/11/2021   CHOL 85 03/11/2021   TRIG 82 03/11/2021   HDL 42 03/11/2021   LDLDIRECT 51.0 08/29/2019   LDLCALC 27 03/11/2021   ALT 40 03/11/2021   AST 57 (H) 03/11/2021   NA 137 03/11/2021   K 3.2 (L) 03/11/2021   CL 105 03/11/2021   CREATININE 0.74 03/11/2021   BUN 8 03/11/2021   CO2 23 03/11/2021   TSH 1.154 03/11/2021   INR 1.10 02/19/2010   HGBA1C 5.3 07/24/2015      Assessment / Plan:   Is a 65 year old gentleman with severe two-vessel coronary artery disease.  He also has in-stent restenosis.  The right-sided is not significant.  On review of his imaging he will need three-vessel bypass which includes LAD the diagonal and the obtuse marginal.  We will harvest left radial artery.  He is tentatively scheduled for 03/13/2021.  We will hold Aggrastat drip  the night prior.     I  spent 40 minutes counseling the patient face to face.   Lajuana Matte 03/11/2021 5:13 PM

## 2021-03-11 NOTE — Progress Notes (Signed)
ANTICOAGULATION CONSULT NOTE - Initial Consult  Pharmacy Consult for Heparin, Tirofiban Indication: chest pain/ACS, awaiting CVTS eval  No Known Allergies  Patient Measurements: Height: 5\' 10"  (177.8 cm) Weight: 67.9 kg (149 lb 11.1 oz) IBW/kg (Calculated) : 73  Vital Signs: Temp: 98.2 F (36.8 C) (09/28 1100) Temp Source: Oral (09/28 1100) BP: 129/95 (09/28 1600) Pulse Rate: 70 (09/28 1600)  Labs: Recent Labs    03/10/21 2058 03/10/21 2250 03/11/21 0109 03/11/21 1550 03/11/21 1652  HGB 15.4  --  13.7  --   --   HCT 43.2  --  38.7*  --   --   PLT 142*  --  132*  --   --   HEPARINUNFRC  --   --   --  <0.10* <0.10*  CREATININE 0.83  --  0.74  --   --   TROPONINIHS 3,242* 2,422*  --   --   --      Estimated Creatinine Clearance: 89.6 mL/min (by C-G formula based on SCr of 0.74 mg/dL).   Medical History: Past Medical History:  Diagnosis Date   Anxiety    Anxiety and depression    ASCVD (arteriosclerotic cardiovascular disease)    a. s/p PTCA to LCx in 1993 b. BMS to RCA in 01/2012 with residual 80% OM2 stenosis and medical management recommended c. 08/2019: cath showing patent stents with occluded OM2 --> medical management.    Asthma    CAD (coronary artery disease)    Carcinoma in situ of colon 2004   rectal polyp   Colitis, ischemic ([place]) 2011   COPD (chronic obstructive pulmonary disease) (HCC)    mild ;excercise induced hypoxemia by cp stress test ;asthma ,bronchitis,   Depression    Diverticulosis    Gastritis 12/30/10   EGD Dr [name]   GERD (gastroesophageal reflux disease)    GI bleed    H. 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)    Leukocytosis    [name]   Lung nodule 07/16/2011   Myocardial infarction [facility])    age 4   Restless leg syndrome    Schatzki's ring    Sleep apnea    does not use CPAP:cannot tolerate, PCP  aware   Syncope    Tobacco abuse    50 pack years continuing at one halp pack daily   Tubular adenoma of colon 06/2009   Colonosocpy Dr [name]     Assessment:  65 y/o M transfer from [facility] for emergent cath. Pt is now s/p cath, found to have multi-vessel CAD. Awaiting evaluation by cardiac surgery. To re-start heparin drip 8 hours after sheath pull. Sheath was out 9/28 at [place]. CBC/renal function good. PTA meds reviewed. Also continuing tirofiban that was started in the cath lab.   -1600 Heparin level <0.1 (on 900 units/hr) -Repeated level to confirm undetectable -1700 Heparin level <0.1 (on 900 units/hr) -Spoke with nurse and line functioning properly with no issues  Goal of Therapy:  Heparin Level 0.3-0.5 (while on GPI) Monitor platelets by anticoagulation protocol: Yes   Plan:  -Increase heparin drip to 1000 units/hr -0300 heparin level -Daily CBC and heparin level -Monitor for bleeding -F/U cardiac surgery rec's  -Cont tirofiban at 0.15 mcg/kg/min: continue until night before procedure (9/30) per Cardiothoracic   Donnald Garre, PharmD Clinical Pharmacist  Please check AMION  for all [place] numbers After 10:00 PM, call [place] [phone]

## 2021-03-11 NOTE — Progress Notes (Addendum)
Discussed with pt and wife IS (2000 ml), sternal precautions, mobility post op, and d/c planning. Pt sleepy, having light CP. Wife very receptive. They are raising grandchildren ages 78 and 65 but wife does not work and will be with him at d/c. [place]ley Seamen materials to review. Will not ambulate preop. [place], ACSM 2:53 PM 03/11/2021

## 2021-03-11 NOTE — Progress Notes (Signed)
Pre cabg has been completed.   Preliminary results in CV Proc.   [name] [name] 03/11/2021 3:43 PM

## 2021-03-11 NOTE — ED Notes (Signed)
Pt vitals stable whole ride to [facility]. Pt complained of chest pain 5 out of 10 whole way. Nitro titrated per guidelines. Pt has resolution to right side of chest but still hurting in the left. Pt A&O x4 and stable.

## 2021-03-11 NOTE — Plan of Care (Signed)

## 2021-03-11 NOTE — Progress Notes (Signed)
ANTICOAGULATION CONSULT NOTE - Initial Consult  Pharmacy Consult for Heparin, Tirofiban Indication: chest pain/ACS, awaiting CVTS eval  No Known Allergies  Patient Measurements: Height: 5\' 10"  (177.8 cm) Weight: 67.6 kg (149 lb) IBW/kg (Calculated) : 73  Vital Signs: Temp: 98.2 F (36.8 C) (09/28 0050) Temp Source: Oral (09/28 0050) BP: 115/82 (09/28 0100) Pulse Rate: 75 (09/28 0100)  Labs: Recent Labs    03/10/21 2058 03/10/21 2250  HGB 15.4  --   HCT 43.2  --   PLT 142*  --   CREATININE 0.83  --   TROPONINIHS 3,242* 2,422*    Estimated Creatinine Clearance: 86 mL/min (by C-G formula based on SCr of 0.83 mg/dL).   Medical History: Past Medical History:  Diagnosis Date   Anxiety    Anxiety and depression    ASCVD (arteriosclerotic cardiovascular disease)    a. s/p PTCA to LCx in 1993 b. BMS to RCA in 01/2012 with residual 80% OM2 stenosis and medical management recommended c. 08/2019: cath showing patent stents with occluded OM2 --> medical management.    Asthma    CAD (coronary artery disease)    Carcinoma in situ of colon 2004   rectal polyp   Colitis, ischemic ([place]) 2011   COPD (chronic obstructive pulmonary disease) (HCC)    mild ;excercise induced hypoxemia by cp stress test ;asthma ,bronchitis,   Depression    Diverticulosis    Gastritis 12/30/10   EGD Dr [name]   GERD (gastroesophageal reflux disease)    GI bleed    H. 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 Pt is now s/p cath, found to have multi-vessel CAD. Awaiting evaluation by cardiac surgery. To re-start heparin drip 8 hours after sheath pull. Sheath was out 9/28 at [place]. CBC/renal function good. PTA meds reviewed. Also continuing tirofiban that was started in the cath lab.    Goal of Therapy:  Heparin level 0.3-0.7 units/ml Monitor platelets by anticoagulation protocol: Yes   Plan:  -Start heparin drip at 900 units/hr today at 0800 -1600 heparin level -Daily CBC and heparin level -Monitor for bleeding -F/U cardiac surgery rec's  -Cont tirofiban at 0.15 mcg/kg/min  -Currently in for 18 hour duration-please f/u with cardiology in the AM to see if they want to continue indefinitely pending definitive plan (CABG vs high risk PCI)   Narda Bonds, PharmD, BCPS Clinical Pharmacist Phone: [phone]

## 2021-03-12 DIAGNOSIS — E876 Hypokalemia: Secondary | ICD-10-CM | POA: Diagnosis not present

## 2021-03-12 DIAGNOSIS — I249 Acute ischemic heart disease, unspecified: Secondary | ICD-10-CM | POA: Diagnosis not present

## 2021-03-12 DIAGNOSIS — Z720 Tobacco use: Secondary | ICD-10-CM | POA: Diagnosis not present

## 2021-03-12 LAB — CBC
HCT: 39.1 % (ref 39.0–52.0)
Hemoglobin: 14 g/dL (ref 13.0–17.0)
MCH: 33.1 pg (ref 26.0–34.0)
MCHC: 35.8 g/dL (ref 30.0–36.0)
MCV: 92.4 fL (ref 80.0–100.0)
Platelets: 118 10*3/uL — ABNORMAL LOW (ref 150–400)
RBC: 4.23 MIL/uL (ref 4.22–5.81)
RDW: 12.9 % (ref 11.5–15.5)
WBC: 9.7 10*3/uL (ref 4.0–10.5)
nRBC: 0 % (ref 0.0–0.2)

## 2021-03-12 LAB — BASIC METABOLIC PANEL
Anion gap: 9 (ref 5–15)
BUN: 7 mg/dL — ABNORMAL LOW (ref 8–23)
CO2: 25 mmol/L (ref 22–32)
Calcium: 8.5 mg/dL — ABNORMAL LOW (ref 8.9–10.3)
Chloride: 105 mmol/L (ref 98–111)
Creatinine, Ser: 0.73 mg/dL (ref 0.61–1.24)
GFR, Estimated: >60 mL/min (ref >60)
Glucose, Bld: 107 mg/dL — ABNORMAL HIGH (ref 70–99)
Potassium: 3.7 mmol/L (ref 3.5–5.1)
Sodium: 139 mmol/L (ref 135–145)

## 2021-03-12 LAB — HEPARIN LEVEL (UNFRACTIONATED)
Heparin Unfractionated: 0.13 IU/mL — ABNORMAL LOW (ref 0.30–0.70)
Heparin Unfractionated: 0.2 IU/mL — ABNORMAL LOW (ref 0.30–0.70)
Heparin Unfractionated: 0.29 IU/mL — ABNORMAL LOW (ref 0.30–0.70)

## 2021-03-12 LAB — MAGNESIUM: Magnesium: 2.2 mg/dL (ref 1.7–2.4)

## 2021-03-12 LAB — ABO/RH: ABO/RH(D): O POS

## 2021-03-12 LAB — PREPARE RBC (CROSSMATCH)

## 2021-03-12 MED ORDER — EPINEPHRINE HCL 5 MG/250ML IV SOLN IN NS
0.0000 ug/min | INTRAVENOUS | Status: DC
  Filled 2021-03-12: qty 250

## 2021-03-12 MED ORDER — MANNITOL 20 % IV SOLN
INTRAVENOUS | Status: DC
  Filled 2021-03-12: qty 13

## 2021-03-12 MED ORDER — POTASSIUM CHLORIDE 2 MEQ/ML IV SOLN
80.0000 meq | INTRAVENOUS | Status: DC
  Filled 2021-03-12: qty 40

## 2021-03-12 MED ORDER — VANCOMYCIN HCL 1250 MG/250ML IV SOLN
1250.0000 mg | INTRAVENOUS | Status: AC
  Administered 2021-03-13: 1250 mg via INTRAVENOUS
  Filled 2021-03-12: qty 250

## 2021-03-12 MED ORDER — INSULIN REGULAR(HUMAN) IN NACL 100-0.9 UT/100ML-% IV SOLN
INTRAVENOUS | Status: AC
  Administered 2021-03-13: .5 [IU]/h via INTRAVENOUS
  Filled 2021-03-12: qty 100

## 2021-03-12 MED ORDER — METOPROLOL TARTRATE 12.5 MG HALF TABLET
12.5000 mg | ORAL_TABLET | Freq: Once | ORAL | Status: AC
  Administered 2021-03-13: 12.5 mg via ORAL
  Filled 2021-03-12: qty 1

## 2021-03-12 MED ORDER — CHLORHEXIDINE GLUCONATE CLOTH 2 % EX PADS: 6.0000 | MEDICATED_PAD | Freq: Once | CUTANEOUS | Status: DC

## 2021-03-12 MED ORDER — NOREPINEPHRINE 4 MG/250ML-% IV SOLN
0.0000 ug/min | INTRAVENOUS | Status: DC
  Filled 2021-03-12: qty 250

## 2021-03-12 MED ORDER — PHENYLEPHRINE HCL-NACL 20-0.9 MG/250ML-% IV SOLN
30.0000 ug/min | INTRAVENOUS | Status: AC
  Administered 2021-03-13: 20 ug/min via INTRAVENOUS
  Filled 2021-03-12: qty 250

## 2021-03-12 MED ORDER — TRANEXAMIC ACID 1000 MG/10ML IV SOLN
1.5000 mg/kg/h | INTRAVENOUS | Status: AC
  Administered 2021-03-13: 1.5 mg/kg/h via INTRAVENOUS
  Filled 2021-03-12: qty 25

## 2021-03-12 MED ORDER — MILRINONE LACTATE IN DEXTROSE 20-5 MG/100ML-% IV SOLN
0.3000 ug/kg/min | INTRAVENOUS | Status: DC
  Filled 2021-03-12: qty 100

## 2021-03-12 MED ORDER — POTASSIUM CHLORIDE CRYS ER 20 MEQ PO TBCR
20.0000 meq | EXTENDED_RELEASE_TABLET | Freq: Once | ORAL | Status: AC
  Administered 2021-03-12: 20 meq via ORAL
  Filled 2021-03-12: qty 1

## 2021-03-12 MED ORDER — PLASMA-LYTE A IV SOLN
INTRAVENOUS | Status: DC
  Filled 2021-03-12: qty 5

## 2021-03-12 MED ORDER — TRANEXAMIC ACID (OHS) BOLUS VIA INFUSION
15.0000 mg/kg | INTRAVENOUS | Status: AC
  Administered 2021-03-13: 1026 mg via INTRAVENOUS
  Filled 2021-03-12: qty 1026

## 2021-03-12 MED ORDER — TEMAZEPAM 15 MG PO CAPS
15.0000 mg | ORAL_CAPSULE | Freq: Once | ORAL | Status: AC | PRN
  Administered 2021-03-12: 15 mg via ORAL
  Filled 2021-03-12: qty 1

## 2021-03-12 MED ORDER — CEFAZOLIN SODIUM-DEXTROSE 2-4 GM/100ML-% IV SOLN
2.0000 g | INTRAVENOUS | Status: DC
  Filled 2021-03-12: qty 100

## 2021-03-12 MED ORDER — BISACODYL 5 MG PO TBEC
5.0000 mg | DELAYED_RELEASE_TABLET | Freq: Once | ORAL | Status: AC
  Administered 2021-03-12: 5 mg via ORAL
  Filled 2021-03-12: qty 1

## 2021-03-12 MED ORDER — TRANEXAMIC ACID (OHS) PUMP PRIME SOLUTION
2.0000 mg/kg | INTRAVENOUS | Status: DC
  Filled 2021-03-12: qty 1.37

## 2021-03-12 MED ORDER — CEFAZOLIN SODIUM-DEXTROSE 2-4 GM/100ML-% IV SOLN
2.0000 g | INTRAVENOUS | Status: AC
  Administered 2021-03-13: 2 g via INTRAVENOUS
  Filled 2021-03-12: qty 100

## 2021-03-12 MED ORDER — NITROGLYCERIN IN D5W 200-5 MCG/ML-% IV SOLN
2.0000 ug/min | INTRAVENOUS | Status: DC
  Filled 2021-03-12: qty 250

## 2021-03-12 MED ORDER — CHLORHEXIDINE GLUCONATE CLOTH 2 % EX PADS
6.0000 | MEDICATED_PAD | Freq: Once | CUTANEOUS | Status: AC
  Administered 2021-03-12: 6 via TOPICAL

## 2021-03-12 MED ORDER — CHLORHEXIDINE GLUCONATE 0.12 % MT SOLN
15.0000 mL | Freq: Once | OROMUCOSAL | Status: AC
  Administered 2021-03-13: 15 mL via OROMUCOSAL
  Filled 2021-03-12: qty 15

## 2021-03-12 MED ORDER — HEPARIN 30,000 UNITS/1000 ML (OHS) CELLSAVER SOLUTION
Status: DC
  Filled 2021-03-12: qty 1000

## 2021-03-12 MED ORDER — DEXMEDETOMIDINE HCL IN NACL 400 MCG/100ML IV SOLN
0.1000 ug/kg/h | INTRAVENOUS | Status: AC
  Administered 2021-03-13: .5 ug/kg/h via INTRAVENOUS
  Filled 2021-03-12: qty 100

## 2021-03-12 NOTE — Progress Notes (Signed)
ANTICOAGULATION CONSULT NOTE - Follow Up Consult  Pharmacy Consult for heparin Indication:  CAD awaiting CABG  Labs: Recent Labs    03/10/21 2058 03/10/21 2250 03/11/21 0109 03/11/21 1550 03/11/21 1652 03/12/21 0258 03/12/21 1054  HGB 15.4  --  13.7  --   --  14.0  --   HCT 43.2  --  38.7*  --   --  39.1  --   PLT 142*  --  132*  --   --  118*  --   HEPARINUNFRC  --   --   --    < > <0.10* 0.13* 0.20*  CREATININE 0.83  --  0.74  --   --  0.73  --   TROPONINIHS 3,242* 2,422*  --   --   --   --   --    < > = values in this interval not displayed.     Assessment: 65yo male subtherapeutic on heparin at 0.20 -- scheduled for CABG tomorrow at noon -- tirofiban plan to dc'd for 9/29 2100. CBC stable PLT stable at 118k  Goal of Therapy:  Heparin level 0.3-0.5 units/ml   Plan:  No bolus. Will increase heparin infusion to 1300 units/hr. Get repeat level in 8 hours.    Wynona Neat, PharmD, BCPS  03/12/2021,12:35 PM

## 2021-03-12 NOTE — Progress Notes (Signed)
Progress Note  Patient Name: [name] Date of Encounter: 03/12/2021  Primary Cardiologist: Dr. Zandra Abts  Subjective   No recurrent chest pain or dyspnea  Inpatient Medications    Scheduled Meds:  amLODipine  5 mg Oral Daily   aspirin EC  81 mg Oral Daily   atorvastatin  80 mg Oral Daily   Chlorhexidine Gluconate Cloth  6 each Topical Daily   dicyclomine  10 mg Oral TID AC & HS   FLUoxetine  60 mg Oral Daily   fluticasone furoate-vilanterol  1 puff Inhalation Daily   gabapentin  600 mg Oral BID   irbesartan  150 mg Oral Daily   isosorbide mononitrate  30 mg Oral Daily   lipase/protease/amylase  36,000 Units Oral TID WC   metoprolol succinate  75 mg Oral Daily   mupirocin ointment  1 application Nasal BID   pantoprazole  40 mg Oral Daily   rOPINIRole  2 mg Oral QHS   sodium chloride flush  3 mL Intravenous Q12H   vitamin B-12  2,000 mcg Oral Daily   Continuous Infusions:  sodium chloride     heparin 1,200 Units/hr (03/12/21 0530)   nitroGLYCERIN 15 mcg/min (03/12/21 0400)   tirofiban 0.15 mcg/kg/min (03/12/21 0400)   PRN Meds: sodium chloride, acetaminophen, albuterol, nitroGLYCERIN, ondansetron (ZOFRAN) IV, oxyCODONE, sodium chloride flush   Vital Signs    Vitals:   03/12/21 0500 03/12/21 0600 03/12/21 0700 03/12/21 0730  BP: (!) 143/87 (!) 136/94 (!) 134/93   Pulse: 64 60 68 65  Resp: 20 20 20 20   Temp:      TempSrc:      SpO2: 96% 96% 93% 93%  Weight: 68.4 kg     Height:        Intake/Output Summary (Last 24 hours) at 03/12/2021 0810 Last data filed at 03/12/2021 0400 Gross per 24 hour  Intake 1245.36 ml  Output 2150 ml  Net -904.64 ml    I/O since admission: -[place] Weights   03/10/21 2050 03/11/21 0500 03/12/21 0500  Weight: 67.6 kg 67.9 kg 68.4 kg    Telemetry    Currently sinus at 70; no ectopy- Personally Reviewed  At 07:28 9/28 had burst of NSVT  ECG   03/12/2021 ECG (independently read by me):  NSR at 70 J point elevation  with peaked T waves V3-4 c/w previous   03/11/2021 ECG (independently read by me): NSR at 72, T wave inversion I,aVL  Physical Exam    BP (!) 134/93 (BP Location: Left Arm)   Pulse 65   Temp 98.6 F (37 C) (Oral)   Resp 20   Ht 5\' 10"  (1.778 m)   Wt 68.4 kg   SpO2 93%   BMI 21.64 kg/m  General: Alert, oriented, no distress.  Skin: normal turgor, no rashes, warm and dry HEENT: Normocephalic, atraumatic. Pupils equal round and reactive to light; sclera anicteric; extraocular muscles intact;  Nose without nasal septal hypertrophy Mouth/Parynx benign;  Neck: No JVD, no carotid bruits; normal carotid upstroke Lungs: clear to ausculatation and percussion; no wheezing or rales Chest wall: without tenderness to palpitation Heart: PMI not displaced, RRR, s1 s2 normal, 1/6 systolic murmur, no diastolic murmur, no rubs, gallops, thrills, or heaves Abdomen: soft, nontender; no hepatosplenomehaly, BS+; abdominal aorta nontender and not dilated by palpation. Back: no CVA tenderness Pulses 2+; R radial site stable Musculoskeletal: full range of motion, normal strength, no joint deformities Extremities: no clubbing cyanosis or edema, Homan's sign  negative  Neurologic: grossly nonfocal; Cranial nerves grossly wnl Psychologic: Normal mood and affect   Labs    Chemistry Recent Labs  Lab 03/10/21 2058 03/11/21 0109 03/12/21 0258  NA 139 137 139  K 3.7 3.2* 3.7  CL 103 105 105  CO2 28 23 25   GLUCOSE 115* 139* 107*  BUN 10 8 7*  CREATININE 0.83 0.74 0.73  CALCIUM 8.9 8.4* 8.5*  PROT  --  5.4*  --   ALBUMIN  --  3.2*  --   AST  --  57*  --   ALT  --  40  --   ALKPHOS  --  84  --   BILITOT  --  0.5  --   GFRNONAA >60 >60 >60  ANIONGAP 8 9 9      Hematology Recent Labs  Lab 03/10/21 2058 03/11/21 0109 03/12/21 0258  WBC 8.9 9.1 9.7  RBC 4.54 4.16* 4.23  HGB 15.4 13.7 14.0  HCT 43.2 38.7* 39.1  MCV 95.2 93.0 92.4  MCH 33.9 32.9 33.1  MCHC 35.6 35.4 35.8  RDW 12.9 12.8  12.9  PLT 142* 132* 118*    Mg: 1.5 > 2.2  HS Trop: 2422 > 3242  Cardiac EnzymesNo results for input(s): TROPONINI in the last 168 hours. No results for input(s): TROPIPOC in the last 168 hours.   BNP Recent Labs  Lab 03/11/21 0109  BNP 341.6*     DDimer No results for input(s): DDIMER in the last 168 hours.   Lipid Panel     Component Value Date/Time   CHOL 85 03/11/2021 0109   CHOL 138 12/13/2017 1603   TRIG 82 03/11/2021 0109   HDL 42 03/11/2021 0109   HDL 34 (L) 12/13/2017 1603   CHOLHDL 2.0 03/11/2021 0109   VLDL 16 03/11/2021 0109   LDLCALC 27 03/11/2021 0109   LDLCALC 43 12/13/2017 1603   LDLDIRECT 51.0 08/29/2019 0236     Radiology    CARDIAC CATHETERIZATION  Result Date: 03/11/2021 CONCLUSIONS: Acute coronary syndrome with ostial 99% of circumflex progression.  First marginal 70%. 40 to 50% distal left main 40 to 50% mid LAD with 40% first diagonal Right coronary with diffuse 30 to 40% in-stent restenosis proximal and eccentric 50 to 60% mid to distal stenosis. LV systolic dysfunction with regional wall motion abnormality, and elevated LVEDP of 22 mmHg.  Findings are consistent with acute on chronic combined systolic and diastolic heart failure. RECOMMENDATIONS: IV Aggrastat IV heparin IV nitro Notify Dr. Kipp Brood of patient's presence and requested consideration of surgical revascularization.  Could also discuss other treatment options which could include circumflex stent with extension into the left main however it would gel the LAD.  Left main stenting would not be an optimal long-term strategy for this patient given his age of 31. Was not loaded with P2 Y 12 agent.   DG Chest Portable 1 View  Result Date: 03/10/2021 CLINICAL DATA:  Central chest pain radiating to the bilateral upper extremities, short of breath, dizziness EXAM: PORTABLE CHEST 1 VIEW COMPARISON:  08/28/2019 FINDINGS: Two frontal views of the chest demonstrate an unremarkable cardiac silhouette.  No acute airspace disease, effusion, or pneumothorax. No acute bony abnormalities. IMPRESSION: 1. Stable chest, no acute process. Electronically Signed   By: Randa Ngo M.D.   On: 03/10/2021 21:17   ECHOCARDIOGRAM COMPLETE  Result Date: 03/11/2021    ECHOCARDIOGRAM REPORT   Patient Name:   [name] Date of Exam: 03/11/2021 Medical Rec #:  [id number]      Height:       70.0 in Accession #:    [id number]     Weight:       149.7 lb Date of Birth:  Aug 27, 1955      BSA:          1.845 m Patient Age:    43 years       BP:           130/96 mmHg Patient Gender: M              HR:           71 bpm. Exam Location:  Inpatient Procedure: 2D Echo, 3D Echo, Cardiac Doppler, Color Doppler and Strain Analysis Indications:    Acute myocardial infarction, unspecified I21.9  History:        Patient has prior history of Echocardiogram examinations, most                 recent 12/18/2020. Risk Factors:Current Smoker and Dyslipidemia.                 Acute coronary syndrome, Nonsustained ventricular tachycardia.  Sonographer:    Darlina Sicilian RDCS Referring Phys: [id number] [place]  1. Inferior and septal hypokinesis . Left ventricular ejection fraction, by estimation, is 45 to 50%. The left ventricle has mildly decreased function. The left ventricle has no regional wall motion abnormalities. The left ventricular internal cavity size was mildly dilated. Left ventricular diastolic parameters were normal. The average left ventricular global longitudinal strain is -12.2 %. The global longitudinal strain is abnormal.  2. Right ventricular systolic function is normal. The right ventricular size is normal.  3. Left atrial size was mildly dilated.  4. The mitral valve is abnormal. Mild to moderate mitral valve regurgitation. No evidence of mitral stenosis.  5. The aortic valve is tricuspid. Aortic valve regurgitation is mild. Mild aortic valve sclerosis is present, with no evidence of aortic valve stenosis.  6. The  inferior vena cava is normal in size with greater than 50% respiratory variability, suggesting right atrial pressure of 3 mmHg. FINDINGS  Left Ventricle: Inferior and septal hypokinesis. Left ventricular ejection fraction, by estimation, is 45 to 50%. The left ventricle has mildly decreased function. The left ventricle has no regional wall motion abnormalities. The average left ventricular global longitudinal strain is -12.2 %. The global longitudinal strain is abnormal. The left ventricular internal cavity size was mildly dilated. There is no left ventricular hypertrophy. Left ventricular diastolic parameters were normal. Right Ventricle: The right ventricular size is normal. No increase in right ventricular wall thickness. Right ventricular systolic function is normal. Left Atrium: Left atrial size was mildly dilated. Right Atrium: Right atrial size was normal in size. Pericardium: There is no evidence of pericardial effusion. Mitral Valve: The mitral valve is abnormal. There is mild thickening of the mitral valve leaflet(s). There is mild calcification of the mitral valve leaflet(s). Mild to moderate mitral valve regurgitation. No evidence of mitral valve stenosis. Tricuspid Valve: The tricuspid valve is normal in structure. Tricuspid valve regurgitation is trivial. No evidence of tricuspid stenosis. Aortic Valve: The aortic valve is tricuspid. Aortic valve regurgitation is mild. Mild aortic valve sclerosis is present, with no evidence of aortic valve stenosis. Pulmonic Valve: The pulmonic valve was normal in structure. Pulmonic valve regurgitation is not visualized. No evidence of pulmonic stenosis. Aorta: The aortic root is normal in size and structure. Venous: The inferior vena cava is  normal in size with greater than 50% respiratory variability, suggesting right atrial pressure of 3 mmHg. IAS/Shunts: No atrial level shunt detected by color flow Doppler.  LEFT VENTRICLE PLAX 2D LVIDd:         5.50 cm   Diastology LVIDs:         4.60 cm  LV e' medial:    7.29 cm/s LV PW:         0.70 cm  LV E/e' medial:  8.0 LV IVS:        1.00 cm  LV e' lateral:   7.62 cm/s LVOT diam:     2.20 cm  LV E/e' lateral: 7.6 LV SV:         55 LV SV Index:   30       2D Longitudinal Strain LVOT Area:     3.80 cm 2D Strain GLS Avg:     -12.2 %                          3D Volume EF:                         3D EF:        50 %                         LV EDV:       165 ml                         LV ESV:       82 ml                         LV SV:        83 ml RIGHT VENTRICLE RV S prime:     13.20 cm/s TAPSE (M-mode): 2.1 cm LEFT ATRIUM             Index       RIGHT ATRIUM           Index LA diam:        3.90 cm 2.11 cm/m  RA Area:     13.20 cm LA Vol (A2C):   44.7 ml 24.22 ml/m RA Volume:   32.20 ml  17.45 ml/m LA Vol (A4C):   42.8 ml 23.19 ml/m LA Biplane Vol: 44.7 ml 24.22 ml/m  AORTIC VALVE LVOT Vmax:   75.00 cm/s LVOT Vmean:  54.100 cm/s LVOT VTI:    0.145 m  AORTA Ao Root diam: 3.20 cm Ao Asc diam:  3.00 cm MITRAL VALVE MV Area (PHT): 4.89 cm      SHUNTS MV Decel Time: 155 msec      Systemic VTI:  0.14 m MR Peak grad:    114.5 mmHg  Systemic Diam: 2.20 cm MR Mean grad:    77.0 mmHg MR Vmax:         535.00 cm/s MR Vmean:        417.0 cm/s MR PISA:         1.01 cm MR PISA Eff ROA: 7 mm MR PISA Radius:  0.40 cm MV E velocity: 58.10 cm/s MV A velocity: 40.40 cm/s MV E/A ratio:  1.44 [name] Rouge MD Electronically signed by [name] Rouge MD Signature Date/Time: 03/11/2021/1:08:14 PM    Final    VAS US DOPPLER PRE CABG  Result Date: 03/11/2021 PREOPERATIVE VASCULAR EVALUATION Patient Name:  [name]  Date of Exam:   03/11/2021 Medical Rec #: [id number]       Accession #:    [id number] Date of Birth: 10/19/1955       Patient Gender: M Patient Age:   19 years Exam Location:  [facility] Procedure:      VAS US DOPPLER PRE CABG Referring Phys: HARRELL LIGHTFOOT  --------------------------------------------------------------------------------  Indications:      Pre-CABG. Risk Factors:     Hypertension, hyperlipidemia, coronary artery disease. Comparison Study: no prior Performing Technologist: Archie Patten RVS  Examination Guidelines: A complete evaluation includes B-mode imaging, spectral Doppler, color Doppler, and power Doppler as needed of all accessible portions of each vessel. Bilateral testing is considered an integral part of a complete examination. Limited examinations for reoccurring indications may be performed as noted.  Right Carotid Findings: +----------+--------+--------+--------+------------+--------+           PSV cm/sEDV cm/sStenosisDescribe    Comments +----------+--------+--------+--------+------------+--------+ CCA Prox  47      11              heterogenous         +----------+--------+--------+--------+------------+--------+ CCA Distal43      11              heterogenous         +----------+--------+--------+--------+------------+--------+ ICA Prox  58      22      1-39%   heterogenous         +----------+--------+--------+--------+------------+--------+ ICA Distal38      16                                   +----------+--------+--------+--------+------------+--------+ ECA       77      11                                   +----------+--------+--------+--------+------------+--------+ +----------+--------+-------+--------+------------+           PSV cm/sEDV cmsDescribeArm Pressure +----------+--------+-------+--------+------------+ Subclavian79                                  +----------+--------+-------+--------+------------+ +---------+--------+--+--------+--+---------+ VertebralPSV cm/s34EDV cm/s11Antegrade +---------+--------+--+--------+--+---------+ Left Carotid Findings: +----------+--------+--------+--------+------------+--------+           PSV cm/sEDV cm/sStenosisDescribe     Comments +----------+--------+--------+--------+------------+--------+ CCA Prox  84      22              heterogenous         +----------+--------+--------+--------+------------+--------+ CCA Distal49      16              heterogenous         +----------+--------+--------+--------+------------+--------+ ICA Prox  45      16      1-39%   heterogenous         +----------+--------+--------+--------+------------+--------+ ICA Distal48      18                                   +----------+--------+--------+--------+------------+--------+ ECA       86      12                                   +----------+--------+--------+--------+------------+--------+ +----------+--------+--------+--------+------------+  SubclavianPSV cm/sEDV cm/sDescribeArm Pressure +----------+--------+--------+--------+------------+           93                                   +----------+--------+--------+--------+------------+ +---------+--------+--+--------+--+---------+ VertebralPSV cm/s32EDV cm/s14Antegrade +---------+--------+--+--------+--+---------+  ABI Findings: +--------+------------------+-----+---------+--------+ Right   Rt Pressure (mmHg)IndexWaveform Comment  +--------+------------------+-----+---------+--------+ Brachial                       triphasic         +--------+------------------+-----+---------+--------+ ATA                            triphasic         +--------+------------------+-----+---------+--------+ PTA                            triphasic         +--------+------------------+-----+---------+--------+ +--------+------------------+-----+---------+-------+ Left    Lt Pressure (mmHg)IndexWaveform Comment +--------+------------------+-----+---------+-------+ Brachial                       triphasic        +--------+------------------+-----+---------+-------+ ATA                            triphasic         +--------+------------------+-----+---------+-------+ PTA                            triphasic        +--------+------------------+-----+---------+-------+  Right Doppler Findings: +--------+--------+-----+---------+--------+ Site    PressureIndexDoppler  Comments +--------+--------+-----+---------+--------+ Brachial             triphasic         +--------+--------+-----+---------+--------+ Radial               triphasic         +--------+--------+-----+---------+--------+ Ulnar                triphasic         +--------+--------+-----+---------+--------+  Left Doppler Findings: +--------+--------+-----+---------+--------+ Site    PressureIndexDoppler  Comments +--------+--------+-----+---------+--------+ Brachial             triphasic         +--------+--------+-----+---------+--------+ Radial               triphasic         +--------+--------+-----+---------+--------+ Ulnar                triphasic         +--------+--------+-----+---------+--------+  Summary: Right Carotid: Velocities in the right ICA are consistent with a 1-39% stenosis. Left Carotid: Velocities in the left ICA are consistent with a 1-39% stenosis. Right Upper Extremity: Doppler waveforms decrease 50% with right radial compression. Doppler waveforms remain within normal limits with right ulnar compression. Left Upper Extremity: Doppler waveforms remain within normal limits with left radial compression. Doppler waveforms remain within normal limits with left ulnar compression.  Electronically signed by Jamelle Haring on 03/11/2021 at 5:20:03 PM.    Final     Cardiac Studies   CONCLUSIONS: Acute coronary syndrome with ostial 99% of circumflex progression.  First marginal 70%. 40 to 50% distal left main 40 to 50% mid LAD with 40% first diagonal Right coronary with diffuse 30 to 40%  in-stent restenosis proximal and eccentric 50 to 60% mid to distal stenosis. LV systolic dysfunction with  regional wall motion abnormality, and elevated LVEDP of 22 mmHg.  Findings are consistent with acute on chronic combined systolic and diastolic heart failure.   RECOMMENDATIONS:   IV Aggrastat IV heparin IV nitro Notify Dr. Kipp Brood of patient's presence and requested consideration of surgical revascularization.  Could also discuss other treatment options which could include circumflex stent with extension into the left main however it would gel the LAD.  Left main stenting would not be an optimal long-term strategy for this patient given his age of 65. Was not loaded with P2 Y 12 agent.  Intervention   ECHO: 9/28/200 IMPRESSIONS    1. Inferior and septal hypokinesis . Left ventricular ejection fraction,  by estimation, is 45 to 50%. The left ventricle has mildly decreased  function. The left ventricle has no regional wall motion abnormalities.  The left ventricular internal cavity  size was mildly dilated. Left ventricular diastolic parameters were  normal. The average left ventricular global longitudinal strain is -12.2  %. The global longitudinal strain is abnormal.   2. Right ventricular systolic function is normal. The right ventricular  size is normal.   3. Left atrial size was mildly dilated.   4. The mitral valve is abnormal. Mild to moderate mitral valve  regurgitation. No evidence of mitral stenosis.   5. The aortic valve is tricuspid. Aortic valve regurgitation is mild.  Mild aortic valve sclerosis is present, with no evidence of aortic valve  stenosis.   6. The inferior vena cava is normal in size with greater than 50%  respiratory variability, suggesting right atrial pressure of 3 mmHg.   Patient Profile     [name] is a 65 y.o. male with coronary disease, h/o PCI with a bare-metal stent to the RCA in 2013 and residual obstructive disease of an OM on catheterization in 2021, COPD, ongoing smoking, prior gastrointestinal bleeding and H. pylori, hypertension who  presented on 03/11/2021 for the evaluation of STEMI.  Assessment & Plan    Day 2 Acute coronary syndrome: Patient developed recurrent chest pain the evening of March 09, 2021.  Chest pain recurred yesterday and became more intense last evening resulting in his presentation.  I reviewed the angiograms.  There is severe multivessel CAD with subtotal ostial stenosis of a large left circumflex vessel.  With his concomitant CAD agree with recommendation for CABG revascularization.  No recurrent NSVT on increased metoprolol succinate 75 mg,  Seen by Dr Kipp Brood, plan for CABG tomorrow.  Will dc aggrastat at 21:00 and continue heparin to be dc prior to surgery.  2.  Electrolyte abnormality: Magnesium 1.5 and potassium 3.2.  Mg today 2.2; K 3.7 will 20 meq KCl today  3   Hyperlipidemia: Continue high potency statin therapy with target LDL under 70;preferably in the 50s.  LDL 27   4  Tobacco abuse: Patient started smoking at age 66 and has been and has been smoking for 50 years.  He continues to smoke after his initial stent to his RCA in 2013.  I discussed the importance of complete smoking cessation.  5.  Mildly reduced platelets at 142 > 132 > 118  Signed, [name] Sine, MD, [facility] 03/12/2021, 8:10 AM

## 2021-03-12 NOTE — Progress Notes (Signed)
     [address]       [place] [address]             [phone]       OR tomorrow for CABG [place]

## 2021-03-12 NOTE — Progress Notes (Signed)
ANTICOAGULATION CONSULT NOTE - Follow Up Consult  Pharmacy Consult for heparin Indication:  CAD awaiting CABG  Labs: Recent Labs    03/10/21 2058 03/10/21 2250 03/11/21 0109 03/11/21 1550 03/11/21 1652 03/12/21 0258  HGB 15.4  --  13.7  --   --  14.0  HCT 43.2  --  38.7*  --   --  39.1  PLT 142*  --  132*  --   --  118*  HEPARINUNFRC  --   --   --  <0.10* <0.10* 0.13*  CREATININE 0.83  --  0.74  --   --  0.73  TROPONINIHS 3,242* 2,422*  --   --   --   --     Assessment: 65yo male subtherapeutic on heparin after rate change; no infusion issues or signs of bleeding per RN.  Goal of Therapy:  Heparin level 0.3-0.5 units/ml   Plan:  Will increase heparin infusion by 3 units/kg/hr to 1200 units/hr and check level in 6 hours.    Wynona Neat, PharmD, BCPS  03/12/2021,5:01 AM

## 2021-03-12 NOTE — Progress Notes (Signed)
ANTICOAGULATION CONSULT NOTE   Pharmacy Consult for Heparin, Tirofiban Indication: chest pain/ACS, awaiting CVTS eval  No Known Allergies  Patient Measurements: Height: 5\' 10"  (177.8 cm) Weight: 68.4 kg (150 lb 12.7 oz) IBW/kg (Calculated) : 73  Vital Signs: Temp: 98.1 F (36.7 C) (09/29 1900) Temp Source: Oral (09/29 1900) BP: 134/94 (09/29 2000) Pulse Rate: 69 (09/29 2000)  Labs: Recent Labs    03/10/21 2058 03/10/21 2250 03/11/21 0109 03/11/21 1550 03/12/21 0258 03/12/21 1054 03/12/21 2043  HGB 15.4  --  13.7  --  14.0  --   --   HCT 43.2  --  38.7*  --  39.1  --   --   PLT 142*  --  132*  --  118*  --   --   HEPARINUNFRC  --   --   --    < > 0.13* 0.20* 0.29*  CREATININE 0.83  --  0.74  --  0.73  --   --   TROPONINIHS 3,242* 2,422*  --   --   --   --   --    < > = values in this interval not displayed.     Estimated Creatinine Clearance: 90.3 mL/min (by C-G formula based on SCr of 0.73 mg/dL).   Medical History: Past Medical History:  Diagnosis Date   Anxiety    Anxiety and depression    ASCVD (arteriosclerotic cardiovascular disease)    a. s/p PTCA to LCx in 1993 b. BMS to RCA in 01/2012 with residual 80% OM2 stenosis and medical management recommended c. 08/2019: cath showing patent stents with occluded OM2 --> medical management.    Asthma    CAD (coronary artery disease)    Carcinoma in situ of colon 2004   rectal polyp   Colitis, ischemic ([place]) 2011   COPD (chronic obstructive pulmonary disease) (HCC)    mild ;excercise induced hypoxemia by cp stress test ;asthma ,bronchitis,   Depression    Diverticulosis    Gastritis 12/30/10   EGD Dr [name]   GERD (gastroesophageal reflux disease)    GI bleed    H. 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 Pt is now s/p cath, found to have multi-vessel CAD. Awaiting evaluation by cardiac surgery. To re-start heparin drip 8 hours after sheath pull. Sheath was out 9/28 at [place]. CBC/renal function good. PTA meds reviewed. Also continuing tirofiban that was started in the cath lab.   Heparin level 0.29 (on 1300 units/hr)  Goal of Therapy:  Heparin Level 0.3-0.5 (while on GPI) Monitor platelets by anticoagulation protocol: Yes   Plan:  Will continue 1300 units/hr as patient going for CABG in the AM Tirofiban discontinued at 2100. Daily Heparin level and CBC ordered Continue to monitor for signs and symptoms  of bleed  Thank you for allowing pharmacy to be a part of this patient's care.  Donnald Garre, PharmD Clinical Pharmacist  Please check AMION for all [facility] numbers After 10:00 PM, call [place] [phone]

## 2021-03-13 ENCOUNTER — Inpatient Hospital Stay (HOSPITAL_COMMUNITY)
Admission: EM | Disposition: A | Discharge status: Home / Self Care | Payer: Self-pay | Source: Home / Self Care | Attending: Thoracic Surgery (Cardiothoracic Vascular Surgery)

## 2021-03-13 ENCOUNTER — Inpatient Hospital Stay (HOSPITAL_COMMUNITY): Payer: Medicare PPO

## 2021-03-13 ENCOUNTER — Inpatient Hospital Stay (HOSPITAL_COMMUNITY): Payer: Medicare PPO | Admitting: Certified Registered"

## 2021-03-13 ENCOUNTER — Other Ambulatory Visit: Payer: Self-pay | Admitting: Cardiology

## 2021-03-13 ENCOUNTER — Encounter (HOSPITAL_COMMUNITY): Payer: Self-pay | Admitting: Interventional Cardiology

## 2021-03-13 ENCOUNTER — Other Ambulatory Visit: Payer: Self-pay | Admitting: *Deleted

## 2021-03-13 DIAGNOSIS — E785 Hyperlipidemia, unspecified: Secondary | ICD-10-CM | POA: Diagnosis not present

## 2021-03-13 DIAGNOSIS — I214 Non-ST elevation (NSTEMI) myocardial infarction: Secondary | ICD-10-CM

## 2021-03-13 DIAGNOSIS — E876 Hypokalemia: Secondary | ICD-10-CM | POA: Diagnosis not present

## 2021-03-13 DIAGNOSIS — I2510 Atherosclerotic heart disease of native coronary artery without angina pectoris: Secondary | ICD-10-CM

## 2021-03-13 DIAGNOSIS — I249 Acute ischemic heart disease, unspecified: Secondary | ICD-10-CM | POA: Diagnosis not present

## 2021-03-13 DIAGNOSIS — Z951 Presence of aortocoronary bypass graft: Secondary | ICD-10-CM

## 2021-03-13 HISTORY — PX: ENDOVEIN HARVEST OF GREATER SAPHENOUS VEIN: SHX5059

## 2021-03-13 HISTORY — PX: CORONARY ARTERY BYPASS GRAFT: SHX141

## 2021-03-13 HISTORY — PX: TEE WITHOUT CARDIOVERSION: SHX5443

## 2021-03-13 LAB — CBC
HCT: 40.3 % (ref 39.0–52.0)
HCT: 40.8 % (ref 39.0–52.0)
Hemoglobin: 14 g/dL (ref 13.0–17.0)
Hemoglobin: 14.4 g/dL (ref 13.0–17.0)
MCH: 32.6 pg (ref 26.0–34.0)
MCH: 33.2 pg (ref 26.0–34.0)
MCHC: 34.7 g/dL (ref 30.0–36.0)
MCHC: 35.3 g/dL (ref 30.0–36.0)
MCV: 93.7 fL (ref 80.0–100.0)
MCV: 94 fL (ref 80.0–100.0)
Platelets: 127 10*3/uL — ABNORMAL LOW (ref 150–400)
Platelets: 86 10*3/uL — ABNORMAL LOW (ref 150–400)
RBC: 4.3 MIL/uL (ref 4.22–5.81)
RBC: 4.34 MIL/uL (ref 4.22–5.81)
RDW: 12.7 % (ref 11.5–15.5)
RDW: 12.5 % (ref 11.5–15.5)
WBC: 11.6 10*3/uL — ABNORMAL HIGH (ref 4.0–10.5)
WBC: 12 10*3/uL — ABNORMAL HIGH (ref 4.0–10.5)
nRBC: 0 % (ref 0.0–0.2)
nRBC: 0 % (ref 0.0–0.2)

## 2021-03-13 LAB — POCT I-STAT 7, (LYTES, BLD GAS, ICA,H+H)
Acid-Base Excess: 0 mmol/L (ref 0.0–2.0)
Acid-Base Excess: 1 mmol/L (ref 0.0–2.0)
Acid-Base Excess: 1 mmol/L (ref 0.0–2.0)
Acid-base deficit: 3 mmol/L — ABNORMAL HIGH (ref 0.0–2.0)
Acid-base deficit: 4 mmol/L — ABNORMAL HIGH (ref 0.0–2.0)
Acid-base deficit: 3 mmol/L — ABNORMAL HIGH (ref 0.0–2.0)
Acid-base deficit: 4 mmol/L — ABNORMAL HIGH (ref 0.0–2.0)
Bicarbonate: 25.7 mmol/L (ref 20.0–28.0)
Bicarbonate: 25.4 mmol/L (ref 20.0–28.0)
Bicarbonate: 25.2 mmol/L (ref 20.0–28.0)
Bicarbonate: 24.5 mmol/L (ref 20.0–28.0)
Bicarbonate: 23.3 mmol/L (ref 20.0–28.0)
Bicarbonate: 23.2 mmol/L (ref 20.0–28.0)
Bicarbonate: 20.9 mmol/L (ref 20.0–28.0)
Calcium, Ion: 1.23 mmol/L (ref 1.15–1.40)
Calcium, Ion: 1.03 mmol/L — ABNORMAL LOW (ref 1.15–1.40)
Calcium, Ion: 1.06 mmol/L — ABNORMAL LOW (ref 1.15–1.40)
Calcium, Ion: 1.29 mmol/L (ref 1.15–1.40)
Calcium, Ion: 1.17 mmol/L (ref 1.15–1.40)
Calcium, Ion: 1.13 mmol/L — ABNORMAL LOW (ref 1.15–1.40)
Calcium, Ion: 1.12 mmol/L — ABNORMAL LOW (ref 1.15–1.40)
HCT: 41 % (ref 39.0–52.0)
HCT: 32 % — ABNORMAL LOW (ref 39.0–52.0)
HCT: 27 % — ABNORMAL LOW (ref 39.0–52.0)
HCT: 38 % — ABNORMAL LOW (ref 39.0–52.0)
HCT: 41 % (ref 39.0–52.0)
HCT: 39 % (ref 39.0–52.0)
HCT: 36 % — ABNORMAL LOW (ref 39.0–52.0)
Hemoglobin: 13.9 g/dL (ref 13.0–17.0)
Hemoglobin: 10.9 g/dL — ABNORMAL LOW (ref 13.0–17.0)
Hemoglobin: 9.2 g/dL — ABNORMAL LOW (ref 13.0–17.0)
Hemoglobin: 12.9 g/dL — ABNORMAL LOW (ref 13.0–17.0)
Hemoglobin: 13.9 g/dL (ref 13.0–17.0)
Hemoglobin: 13.3 g/dL (ref 13.0–17.0)
Hemoglobin: 12.2 g/dL — ABNORMAL LOW (ref 13.0–17.0)
O2 Saturation: 100 %
O2 Saturation: 100 %
O2 Saturation: 100 %
O2 Saturation: 97 %
O2 Saturation: 95 %
O2 Saturation: 98 %
O2 Saturation: 97 %
Patient temperature: 37
Patient temperature: 37.1
Patient temperature: 37.1
Patient temperature: 37.4
Potassium: 3.6 mmol/L (ref 3.5–5.1)
Potassium: 3 mmol/L — ABNORMAL LOW (ref 3.5–5.1)
Potassium: 4.2 mmol/L (ref 3.5–5.1)
Potassium: 3.9 mmol/L (ref 3.5–5.1)
Potassium: 3.8 mmol/L (ref 3.5–5.1)
Potassium: 4.2 mmol/L (ref 3.5–5.1)
Potassium: 4.3 mmol/L (ref 3.5–5.1)
Sodium: 137 mmol/L (ref 135–145)
Sodium: 138 mmol/L (ref 135–145)
Sodium: 137 mmol/L (ref 135–145)
Sodium: 139 mmol/L (ref 135–145)
Sodium: 141 mmol/L (ref 135–145)
Sodium: 141 mmol/L (ref 135–145)
Sodium: 140 mmol/L (ref 135–145)
TCO2: 27 mmol/L (ref 22–32)
TCO2: 27 mmol/L (ref 22–32)
TCO2: 26 mmol/L (ref 22–32)
TCO2: 26 mmol/L (ref 22–32)
TCO2: 25 mmol/L (ref 22–32)
TCO2: 24 mmol/L (ref 22–32)
TCO2: 22 mmol/L (ref 22–32)
pCO2 arterial: 44.7 mmHg (ref 32.0–48.0)
pCO2 arterial: 39.4 mmHg (ref 32.0–48.0)
pCO2 arterial: 38.7 mmHg (ref 32.0–48.0)
pCO2 arterial: 51.1 mmHg — ABNORMAL HIGH (ref 32.0–48.0)
pCO2 arterial: 50.9 mmHg — ABNORMAL HIGH (ref 32.0–48.0)
pCO2 arterial: 44.3 mmHg (ref 32.0–48.0)
pCO2 arterial: 38 mmHg (ref 32.0–48.0)
pH, Arterial: 7.368 (ref 7.350–7.450)
pH, Arterial: 7.418 (ref 7.350–7.450)
pH, Arterial: 7.421 (ref 7.350–7.450)
pH, Arterial: 7.289 — ABNORMAL LOW (ref 7.350–7.450)
pH, Arterial: 7.269 — ABNORMAL LOW (ref 7.350–7.450)
pH, Arterial: 7.327 — ABNORMAL LOW (ref 7.350–7.450)
pH, Arterial: 7.351 (ref 7.350–7.450)
pO2, Arterial: 341 mmHg — ABNORMAL HIGH (ref 83.0–108.0)
pO2, Arterial: 306 mmHg — ABNORMAL HIGH (ref 83.0–108.0)
pO2, Arterial: 225 mmHg — ABNORMAL HIGH (ref 83.0–108.0)
pO2, Arterial: 102 mmHg (ref 83.0–108.0)
pO2, Arterial: 88 mmHg (ref 83.0–108.0)
pO2, Arterial: 110 mmHg — ABNORMAL HIGH (ref 83.0–108.0)
pO2, Arterial: 101 mmHg (ref 83.0–108.0)

## 2021-03-13 LAB — POCT I-STAT EG7
Acid-Base Excess: 0 mmol/L (ref 0.0–2.0)
Bicarbonate: 25 mmol/L (ref 20.0–28.0)
Calcium, Ion: 1.05 mmol/L — ABNORMAL LOW (ref 1.15–1.40)
HCT: 30 % — ABNORMAL LOW (ref 39.0–52.0)
Hemoglobin: 10.2 g/dL — ABNORMAL LOW (ref 13.0–17.0)
O2 Saturation: 79 %
Potassium: 4.4 mmol/L (ref 3.5–5.1)
Sodium: 137 mmol/L (ref 135–145)
TCO2: 26 mmol/L (ref 22–32)
pCO2, Ven: 40.4 mmHg — ABNORMAL LOW (ref 44.0–60.0)
pH, Ven: 7.399 (ref 7.250–7.430)
pO2, Ven: 43 mmHg (ref 32.0–45.0)

## 2021-03-13 LAB — POCT I-STAT, CHEM 8
BUN: 8 mg/dL (ref 8–23)
BUN: 8 mg/dL (ref 8–23)
BUN: 7 mg/dL — ABNORMAL LOW (ref 8–23)
BUN: 7 mg/dL — ABNORMAL LOW (ref 8–23)
Calcium, Ion: 1.23 mmol/L (ref 1.15–1.40)
Calcium, Ion: 1.22 mmol/L (ref 1.15–1.40)
Calcium, Ion: 1.06 mmol/L — ABNORMAL LOW (ref 1.15–1.40)
Calcium, Ion: 1.41 mmol/L — ABNORMAL HIGH (ref 1.15–1.40)
Chloride: 102 mmol/L (ref 98–111)
Chloride: 102 mmol/L (ref 98–111)
Chloride: 101 mmol/L (ref 98–111)
Chloride: 105 mmol/L (ref 98–111)
Creatinine, Ser: 0.4 mg/dL — ABNORMAL LOW (ref 0.61–1.24)
Creatinine, Ser: 0.5 mg/dL — ABNORMAL LOW (ref 0.61–1.24)
Creatinine, Ser: 0.3 mg/dL — ABNORMAL LOW (ref 0.61–1.24)
Creatinine, Ser: 0.5 mg/dL — ABNORMAL LOW (ref 0.61–1.24)
Glucose, Bld: 93 mg/dL (ref 70–99)
Glucose, Bld: 95 mg/dL (ref 70–99)
Glucose, Bld: 94 mg/dL (ref 70–99)
Glucose, Bld: 92 mg/dL (ref 70–99)
HCT: 42 % (ref 39.0–52.0)
HCT: 38 % — ABNORMAL LOW (ref 39.0–52.0)
HCT: 30 % — ABNORMAL LOW (ref 39.0–52.0)
HCT: 32 % — ABNORMAL LOW (ref 39.0–52.0)
Hemoglobin: 14.3 g/dL (ref 13.0–17.0)
Hemoglobin: 12.9 g/dL — ABNORMAL LOW (ref 13.0–17.0)
Hemoglobin: 10.2 g/dL — ABNORMAL LOW (ref 13.0–17.0)
Hemoglobin: 10.9 g/dL — ABNORMAL LOW (ref 13.0–17.0)
Potassium: 3.6 mmol/L (ref 3.5–5.1)
Potassium: 3.4 mmol/L — ABNORMAL LOW (ref 3.5–5.1)
Potassium: 3.9 mmol/L (ref 3.5–5.1)
Potassium: 3.9 mmol/L (ref 3.5–5.1)
Sodium: 138 mmol/L (ref 135–145)
Sodium: 137 mmol/L (ref 135–145)
Sodium: 135 mmol/L (ref 135–145)
Sodium: 138 mmol/L (ref 135–145)
TCO2: 24 mmol/L (ref 22–32)
TCO2: 24 mmol/L (ref 22–32)
TCO2: 25 mmol/L (ref 22–32)
TCO2: 22 mmol/L (ref 22–32)

## 2021-03-13 LAB — GLUCOSE, CAPILLARY
Glucose-Capillary: 96 mg/dL (ref 70–99)
Glucose-Capillary: 104 mg/dL — ABNORMAL HIGH (ref 70–99)
Glucose-Capillary: 114 mg/dL — ABNORMAL HIGH (ref 70–99)
Glucose-Capillary: 111 mg/dL — ABNORMAL HIGH (ref 70–99)
Glucose-Capillary: 113 mg/dL — ABNORMAL HIGH (ref 70–99)

## 2021-03-13 LAB — BASIC METABOLIC PANEL
Anion gap: 9 (ref 5–15)
BUN: 12 mg/dL (ref 8–23)
CO2: 24 mmol/L (ref 22–32)
Calcium: 8.6 mg/dL — ABNORMAL LOW (ref 8.9–10.3)
Chloride: 103 mmol/L (ref 98–111)
Creatinine, Ser: 0.72 mg/dL (ref 0.61–1.24)
GFR, Estimated: >60 mL/min (ref >60)
Glucose, Bld: 104 mg/dL — ABNORMAL HIGH (ref 70–99)
Potassium: 3.5 mmol/L (ref 3.5–5.1)
Sodium: 136 mmol/L (ref 135–145)

## 2021-03-13 LAB — HEPARIN LEVEL (UNFRACTIONATED): Heparin Unfractionated: 0.37 IU/mL (ref 0.30–0.70)

## 2021-03-13 LAB — PROTIME-INR
INR: 1.3 — ABNORMAL HIGH (ref 0.8–1.2)
Prothrombin Time: 16.4 seconds — ABNORMAL HIGH (ref 11.4–15.2)

## 2021-03-13 LAB — PLATELET COUNT: Platelets: 117 10*3/uL — ABNORMAL LOW (ref 150–400)

## 2021-03-13 LAB — HEMOGLOBIN AND HEMATOCRIT, BLOOD
HCT: 30.8 % — ABNORMAL LOW (ref 39.0–52.0)
Hemoglobin: 11 g/dL — ABNORMAL LOW (ref 13.0–17.0)

## 2021-03-13 LAB — APTT: aPTT: 30 seconds (ref 24–36)

## 2021-03-13 SURGERY — CORONARY ARTERY BYPASS GRAFTING (CABG)
Anesthesia: General | Site: Leg Upper | Laterality: Right

## 2021-03-13 MED ORDER — SODIUM CHLORIDE 0.9 % IV SOLN: INTRAVENOUS | Status: DC

## 2021-03-13 MED ORDER — BISACODYL 10 MG RE SUPP: 10.0000 mg | Freq: Every day | RECTAL | Status: DC

## 2021-03-13 MED ORDER — TRAMADOL HCL 50 MG PO TABS
50.0000 mg | ORAL_TABLET | ORAL | Status: DC | PRN
  Administered 2021-03-14: 100 mg via ORAL
  Filled 2021-03-13: qty 2

## 2021-03-13 MED ORDER — LACTATED RINGERS IV SOLN: INTRAVENOUS | Status: DC

## 2021-03-13 MED ORDER — DEXMEDETOMIDINE HCL IN NACL 400 MCG/100ML IV SOLN: 0.0000 ug/kg/h | INTRAVENOUS | Status: DC

## 2021-03-13 MED ORDER — 0.9 % SODIUM CHLORIDE (POUR BTL) OPTIME
TOPICAL | Status: DC | PRN
  Administered 2021-03-13: 6000 mL

## 2021-03-13 MED ORDER — NITROGLYCERIN IN D5W 200-5 MCG/ML-% IV SOLN: 0.0000 ug/min | INTRAVENOUS | Status: DC

## 2021-03-13 MED ORDER — ACETAMINOPHEN 160 MG/5ML PO SOLN
1000.0000 mg | Freq: Four times a day (QID) | ORAL | Status: AC
  Administered 2021-03-13: 1000 mg
  Filled 2021-03-13 (×5): qty 40.6

## 2021-03-13 MED ORDER — DOBUTAMINE IN D5W 4-5 MG/ML-% IV SOLN: 0.0000 ug/kg/min | INTRAVENOUS | Status: DC

## 2021-03-13 MED ORDER — HEPARIN SODIUM (PORCINE) 1000 UNIT/ML IJ SOLN
INTRAMUSCULAR | Status: DC | PRN
  Administered 2021-03-13: 24000 [IU] via INTRAVENOUS

## 2021-03-13 MED ORDER — DEXTROSE 50 % IV SOLN: 0.0000 mL | INTRAVENOUS | Status: DC | PRN

## 2021-03-13 MED ORDER — ALBUMIN HUMAN 25 % IV SOLN
12.5000 g | INTRAVENOUS | Status: DC | PRN
  Administered 2021-03-13 – 2021-03-14 (×4): 12.5 g via INTRAVENOUS
  Filled 2021-03-13 (×2): qty 50

## 2021-03-13 MED ORDER — POTASSIUM CHLORIDE 10 MEQ/50ML IV SOLN
10.0000 meq | INTRAVENOUS | Status: AC
  Administered 2021-03-13 (×3): 10 meq via INTRAVENOUS

## 2021-03-13 MED ORDER — ROCURONIUM BROMIDE 10 MG/ML (PF) SYRINGE
PREFILLED_SYRINGE | INTRAVENOUS | Status: DC | PRN
Start: 2021-03-13 — End: 2021-03-13
  Administered 2021-03-13: 30 mg via INTRAVENOUS
  Administered 2021-03-13 (×2): 50 mg via INTRAVENOUS
  Administered 2021-03-13: 70 mg via INTRAVENOUS

## 2021-03-13 MED ORDER — SODIUM CHLORIDE 0.9% FLUSH: 3.0000 mL | INTRAVENOUS | Status: DC | PRN

## 2021-03-13 MED ORDER — PHENYLEPHRINE 40 MCG/ML (10ML) SYRINGE FOR IV PUSH (FOR BLOOD PRESSURE SUPPORT)
PREFILLED_SYRINGE | INTRAVENOUS | Status: AC
  Filled 2021-03-13: qty 10

## 2021-03-13 MED ORDER — ALBUMIN HUMAN 5 % IV SOLN
250.0000 mL | INTRAVENOUS | Status: DC | PRN
Start: 2021-03-13 — End: 2021-03-13

## 2021-03-13 MED ORDER — SODIUM CHLORIDE 0.9 % IV SOLN: INTRAVENOUS | Status: DC | PRN

## 2021-03-13 MED ORDER — MIDAZOLAM HCL 5 MG/5ML IJ SOLN
INTRAMUSCULAR | Status: DC | PRN
  Administered 2021-03-13: 2 mg via INTRAVENOUS
  Administered 2021-03-13: 1 mg via INTRAVENOUS
  Administered 2021-03-13 (×2): 2 mg via INTRAVENOUS
  Administered 2021-03-13 (×3): 1 mg via INTRAVENOUS

## 2021-03-13 MED ORDER — METOPROLOL TARTRATE 25 MG/10 ML ORAL SUSPENSION
12.5000 mg | Freq: Two times a day (BID) | ORAL | Status: DC
  Administered 2021-03-15: 12.5 mg
  Filled 2021-03-13: qty 5

## 2021-03-13 MED ORDER — OXYCODONE HCL 5 MG PO TABS
5.0000 mg | ORAL_TABLET | ORAL | Status: DC | PRN
  Administered 2021-03-14 – 2021-03-16 (×4): 10 mg via ORAL
  Filled 2021-03-13 (×4): qty 2

## 2021-03-13 MED ORDER — ROCURONIUM BROMIDE 10 MG/ML (PF) SYRINGE
PREFILLED_SYRINGE | INTRAVENOUS | Status: AC
  Filled 2021-03-13: qty 10

## 2021-03-13 MED ORDER — PHENYLEPHRINE 40 MCG/ML (10ML) SYRINGE FOR IV PUSH (FOR BLOOD PRESSURE SUPPORT)
PREFILLED_SYRINGE | INTRAVENOUS | Status: DC | PRN
  Administered 2021-03-13: 80 ug via INTRAVENOUS
  Administered 2021-03-13: 40 ug via INTRAVENOUS

## 2021-03-13 MED ORDER — ACETAMINOPHEN 650 MG RE SUPP
650.0000 mg | Freq: Once | RECTAL | Status: AC
  Administered 2021-03-13: 650 mg via RECTAL

## 2021-03-13 MED ORDER — SODIUM CHLORIDE 0.9% FLUSH
3.0000 mL | Freq: Two times a day (BID) | INTRAVENOUS | Status: DC
  Administered 2021-03-14 – 2021-03-15 (×4): 3 mL via INTRAVENOUS

## 2021-03-13 MED ORDER — CALCIUM CHLORIDE 10 % IV SOLN
INTRAVENOUS | Status: DC | PRN
  Administered 2021-03-13: 200 mg via INTRAVENOUS

## 2021-03-13 MED ORDER — LACTATED RINGERS IV SOLN: INTRAVENOUS | Status: DC | PRN

## 2021-03-13 MED ORDER — FENTANYL CITRATE (PF) 250 MCG/5ML IJ SOLN
INTRAMUSCULAR | Status: DC | PRN
  Administered 2021-03-13: 150 ug via INTRAVENOUS
  Administered 2021-03-13 (×2): 100 ug via INTRAVENOUS
  Administered 2021-03-13: 150 ug via INTRAVENOUS
  Administered 2021-03-13: 50 ug via INTRAVENOUS
  Administered 2021-03-13 (×2): 150 ug via INTRAVENOUS
  Administered 2021-03-13: 100 ug via INTRAVENOUS
  Administered 2021-03-13: 50 ug via INTRAVENOUS

## 2021-03-13 MED ORDER — CEFAZOLIN SODIUM-DEXTROSE 2-4 GM/100ML-% IV SOLN
2.0000 g | Freq: Three times a day (TID) | INTRAVENOUS | Status: AC
  Administered 2021-03-13 – 2021-03-15 (×6): 2 g via INTRAVENOUS
  Filled 2021-03-13 (×6): qty 100

## 2021-03-13 MED ORDER — BISACODYL 5 MG PO TBEC
10.0000 mg | DELAYED_RELEASE_TABLET | Freq: Every day | ORAL | Status: DC
  Administered 2021-03-14 – 2021-03-17 (×3): 10 mg via ORAL
  Filled 2021-03-13 (×5): qty 2

## 2021-03-13 MED ORDER — VANCOMYCIN HCL IN DEXTROSE 1-5 GM/200ML-% IV SOLN
1000.0000 mg | Freq: Once | INTRAVENOUS | Status: AC
  Administered 2021-03-14: 1000 mg via INTRAVENOUS

## 2021-03-13 MED ORDER — CHLORHEXIDINE GLUCONATE 0.12 % MT SOLN: 15.0000 mL | Freq: Once | OROMUCOSAL | Status: AC

## 2021-03-13 MED ORDER — PROPOFOL 10 MG/ML IV BOLUS
INTRAVENOUS | Status: DC | PRN
Start: 2021-03-13 — End: 2021-03-13
  Administered 2021-03-13: 100 mg via INTRAVENOUS

## 2021-03-13 MED ORDER — SODIUM CHLORIDE 0.45 % IV SOLN: INTRAVENOUS | Status: DC | PRN

## 2021-03-13 MED ORDER — ORAL CARE MOUTH RINSE: 15.0000 mL | Freq: Once | OROMUCOSAL | Status: AC

## 2021-03-13 MED ORDER — CHLORHEXIDINE GLUCONATE 0.12 % MT SOLN
OROMUCOSAL | Status: AC
  Administered 2021-03-13: 15 mL via OROMUCOSAL
  Filled 2021-03-13: qty 15

## 2021-03-13 MED ORDER — ORAL CARE MOUTH RINSE
15.0000 mL | OROMUCOSAL | Status: DC
  Administered 2021-03-14 (×3): 15 mL via OROMUCOSAL

## 2021-03-13 MED ORDER — SODIUM CHLORIDE 0.9 % IV SOLN
250.0000 mL | INTRAVENOUS | Status: DC
  Administered 2021-03-14: 250 mL via INTRAVENOUS

## 2021-03-13 MED ORDER — ARTIFICIAL TEARS OPHTHALMIC OINT
TOPICAL_OINTMENT | OPHTHALMIC | Status: DC | PRN
Start: 2021-03-13 — End: 2021-03-13
  Administered 2021-03-13: 1 via OPHTHALMIC

## 2021-03-13 MED ORDER — ASPIRIN 81 MG PO CHEW
324.0000 mg | CHEWABLE_TABLET | Freq: Every day | ORAL | Status: DC
  Administered 2021-03-15: 324 mg

## 2021-03-13 MED ORDER — MIDAZOLAM HCL (PF) 10 MG/2ML IJ SOLN
INTRAMUSCULAR | Status: AC
  Filled 2021-03-13: qty 2

## 2021-03-13 MED ORDER — MAGNESIUM SULFATE 4 GM/100ML IV SOLN
4.0000 g | Freq: Once | INTRAVENOUS | Status: AC
  Administered 2021-03-13: 4 g via INTRAVENOUS
  Filled 2021-03-13: qty 100

## 2021-03-13 MED ORDER — SODIUM CHLORIDE 0.9% FLUSH
10.0000 mL | INTRAVENOUS | Status: DC | PRN
  Administered 2021-03-16: 10 mL

## 2021-03-13 MED ORDER — MIDAZOLAM HCL 2 MG/2ML IJ SOLN
2.0000 mg | INTRAMUSCULAR | Status: DC | PRN
  Administered 2021-03-13: 2 mg via INTRAVENOUS
  Filled 2021-03-13 (×3): qty 2

## 2021-03-13 MED ORDER — LACTATED RINGERS IV SOLN
500.0000 mL | Freq: Once | INTRAVENOUS | Status: AC | PRN
  Administered 2021-03-13: 250 mL via INTRAVENOUS

## 2021-03-13 MED ORDER — PLASMA-LYTE A IV SOLN
INTRAVENOUS | Status: DC | PRN
  Administered 2021-03-13: 1000 mL via INTRAVASCULAR

## 2021-03-13 MED ORDER — ACETAMINOPHEN 500 MG PO TABS
1000.0000 mg | ORAL_TABLET | Freq: Four times a day (QID) | ORAL | Status: AC
  Administered 2021-03-14 – 2021-03-18 (×14): 1000 mg via ORAL
  Filled 2021-03-13 (×16): qty 2

## 2021-03-13 MED ORDER — SODIUM CHLORIDE 0.9% FLUSH
10.0000 mL | Freq: Two times a day (BID) | INTRAVENOUS | Status: DC
  Administered 2021-03-13 – 2021-03-17 (×9): 10 mL

## 2021-03-13 MED ORDER — FENTANYL CITRATE (PF) 250 MCG/5ML IJ SOLN
INTRAMUSCULAR | Status: AC
  Filled 2021-03-13: qty 25

## 2021-03-13 MED ORDER — NOREPINEPHRINE 4 MG/250ML-% IV SOLN: 0.0000 ug/min | INTRAVENOUS | Status: DC

## 2021-03-13 MED ORDER — MORPHINE SULFATE (PF) 2 MG/ML IV SOLN
1.0000 mg | INTRAVENOUS | Status: DC | PRN
  Administered 2021-03-13 – 2021-03-15 (×4): 2 mg via INTRAVENOUS
  Filled 2021-03-13 (×4): qty 1

## 2021-03-13 MED ORDER — DOCUSATE SODIUM 100 MG PO CAPS
200.0000 mg | ORAL_CAPSULE | Freq: Every day | ORAL | Status: DC
  Administered 2021-03-14 – 2021-03-19 (×5): 200 mg via ORAL
  Filled 2021-03-13 (×6): qty 2

## 2021-03-13 MED ORDER — NICARDIPINE HCL IN NACL 20-0.86 MG/200ML-% IV SOLN
0.0000 mg/h | INTRAVENOUS | Status: DC
  Administered 2021-03-13: 5 mg/h via INTRAVENOUS
  Administered 2021-03-15: 2.5 mg/h via INTRAVENOUS
  Administered 2021-03-15: 12.5 mg/h via INTRAVENOUS
  Administered 2021-03-15 (×2): 15 mg/h via INTRAVENOUS
  Administered 2021-03-15: 7.5 mg/h via INTRAVENOUS
  Filled 2021-03-13 (×6): qty 200

## 2021-03-13 MED ORDER — PROTAMINE SULFATE 10 MG/ML IV SOLN
INTRAVENOUS | Status: AC
  Filled 2021-03-13: qty 25

## 2021-03-13 MED ORDER — PROTAMINE SULFATE 10 MG/ML IV SOLN
INTRAVENOUS | Status: DC | PRN
Start: 2021-03-13 — End: 2021-03-13
  Administered 2021-03-13: 240 mg via INTRAVENOUS

## 2021-03-13 MED ORDER — ASPIRIN EC 325 MG PO TBEC
325.0000 mg | DELAYED_RELEASE_TABLET | Freq: Every day | ORAL | Status: DC
  Administered 2021-03-14 – 2021-03-16 (×2): 325 mg via ORAL
  Filled 2021-03-13 (×3): qty 1

## 2021-03-13 MED ORDER — CHLORHEXIDINE GLUCONATE CLOTH 2 % EX PADS
6.0000 | MEDICATED_PAD | Freq: Every day | CUTANEOUS | Status: DC
  Administered 2021-03-14 – 2021-03-15 (×3): 6 via TOPICAL

## 2021-03-13 MED ORDER — HEPARIN SODIUM (PORCINE) 1000 UNIT/ML IJ SOLN
INTRAMUSCULAR | Status: AC
  Filled 2021-03-13: qty 1

## 2021-03-13 MED ORDER — CHLORHEXIDINE GLUCONATE 0.12% ORAL RINSE (MEDLINE KIT)
15.0000 mL | Freq: Two times a day (BID) | OROMUCOSAL | Status: DC
  Administered 2021-03-13 – 2021-03-14 (×2): 15 mL via OROMUCOSAL

## 2021-03-13 MED ORDER — SODIUM CHLORIDE 0.9 % IV BOLUS
250.0000 mL | INTRAVENOUS | Status: DC | PRN
  Administered 2021-03-13 – 2021-03-14 (×4): 250 mL via INTRAVENOUS

## 2021-03-13 MED ORDER — CHLORHEXIDINE GLUCONATE 0.12 % MT SOLN
15.0000 mL | OROMUCOSAL | Status: AC
  Administered 2021-03-13: 15 mL via OROMUCOSAL

## 2021-03-13 MED ORDER — SODIUM CHLORIDE (PF) 0.9 % IJ SOLN
OROMUCOSAL | Status: DC | PRN
  Administered 2021-03-13 (×2): 4 mL via TOPICAL

## 2021-03-13 MED ORDER — ARTIFICIAL TEARS OPHTHALMIC OINT
TOPICAL_OINTMENT | OPHTHALMIC | Status: AC
  Filled 2021-03-13: qty 3.5

## 2021-03-13 MED ORDER — INSULIN REGULAR(HUMAN) IN NACL 100-0.9 UT/100ML-% IV SOLN: INTRAVENOUS | Status: DC

## 2021-03-13 MED ORDER — VASOPRESSIN 20 UNIT/ML IV SOLN
INTRAVENOUS | Status: AC
  Filled 2021-03-13: qty 1

## 2021-03-13 MED ORDER — POTASSIUM CHLORIDE 10 MEQ/100ML IV SOLN
10.0000 meq | INTRAVENOUS | Status: AC
  Administered 2021-03-13 (×2): 10 meq via INTRAVENOUS
  Filled 2021-03-13 (×2): qty 100

## 2021-03-13 MED ORDER — FAMOTIDINE IN NACL 20-0.9 MG/50ML-% IV SOLN
20.0000 mg | Freq: Two times a day (BID) | INTRAVENOUS | Status: AC
  Administered 2021-03-13: 20 mg via INTRAVENOUS
  Filled 2021-03-13: qty 50

## 2021-03-13 MED ORDER — ONDANSETRON HCL 4 MG/2ML IJ SOLN: 4.0000 mg | Freq: Four times a day (QID) | INTRAMUSCULAR | Status: DC | PRN

## 2021-03-13 MED ORDER — ACETAMINOPHEN 160 MG/5ML PO SOLN: 650.0000 mg | Freq: Once | ORAL | Status: AC

## 2021-03-13 MED ORDER — METOPROLOL TARTRATE 12.5 MG HALF TABLET
12.5000 mg | ORAL_TABLET | Freq: Two times a day (BID) | ORAL | Status: DC
  Administered 2021-03-14 – 2021-03-15 (×2): 12.5 mg via ORAL
  Filled 2021-03-13 (×3): qty 1

## 2021-03-13 MED ORDER — PANTOPRAZOLE SODIUM 40 MG PO TBEC: 40.0000 mg | DELAYED_RELEASE_TABLET | Freq: Every day | ORAL | Status: DC

## 2021-03-13 MED ORDER — METOPROLOL TARTRATE 5 MG/5ML IV SOLN
2.5000 mg | INTRAVENOUS | Status: DC | PRN
Start: 2021-03-13 — End: 2021-03-19
  Administered 2021-03-14: 2.5 mg via INTRAVENOUS
  Administered 2021-03-15 (×2): 5 mg via INTRAVENOUS
  Administered 2021-03-16: 2.5 mg via INTRAVENOUS
  Filled 2021-03-13 (×4): qty 5

## 2021-03-13 SURGICAL SUPPLY — 87 items
BAG DECANTER FOR FLEXI CONT (MISCELLANEOUS) ×5 IMPLANT
BLADE CLIPPER SURG (BLADE) ×5 IMPLANT
BLADE STERNUM SYSTEM 6 (BLADE) ×5 IMPLANT
BLADE SURG 15 STRL LF DISP TIS (BLADE) ×4 IMPLANT
BLADE SURG 15 STRL SS (BLADE) ×5
BNDG ELASTIC 4X5.8 VLCR STR LF (GAUZE/BANDAGES/DRESSINGS) ×5 IMPLANT
BNDG ELASTIC 6X5.8 VLCR STR LF (GAUZE/BANDAGES/DRESSINGS) ×5 IMPLANT
BNDG GAUZE ELAST 4 BULKY (GAUZE/BANDAGES/DRESSINGS) ×5 IMPLANT
CABLE SURGICAL S-101-97-12 (CABLE) ×5 IMPLANT
CANISTER SUCT 3000ML PPV (MISCELLANEOUS) ×5 IMPLANT
CANNULA MC2 2 STG 29/37 NON-V (CANNULA) ×4 IMPLANT
CANNULA MC2 TWO STAGE (CANNULA) ×5
CANNULA NON VENT 20FR 12 (CANNULA) ×5 IMPLANT
CATH ROBINSON RED A/P 18FR (CATHETERS) ×10 IMPLANT
CONNECTOR BLAKE 2:1 CARIO BLK (MISCELLANEOUS) ×5 IMPLANT
CONTAINER PROTECT SURGISLUSH (MISCELLANEOUS) ×10 IMPLANT
COVER MAYO STAND STRL (DRAPES) ×5 IMPLANT
DRAIN CHANNEL 19F RND (DRAIN) ×15 IMPLANT
DRAIN CONNECTOR BLAKE 1:1 (MISCELLANEOUS) ×5 IMPLANT
DRAPE CARDIOVASCULAR INCISE (DRAPES) ×5
DRAPE EXTREMITY T 121X128X90 (DISPOSABLE) ×5 IMPLANT
DRAPE HALF SHEET 40X57 (DRAPES) ×5 IMPLANT
DRAPE SRG 135X102X78XABS (DRAPES) ×4 IMPLANT
DRAPE WARM FLUID 44X44 (DRAPES) ×5 IMPLANT
DRSG AQUACEL AG ADV 3.5X 4 (GAUZE/BANDAGES/DRESSINGS) ×5 IMPLANT
DRSG AQUACEL AG ADV 3.5X14 (GAUZE/BANDAGES/DRESSINGS) ×5 IMPLANT
ELECT BLADE 4.0 EZ CLEAN MEGAD (MISCELLANEOUS) ×5
ELECT REM PT RETURN 9FT ADLT (ELECTROSURGICAL) ×10
ELECTRODE BLDE 4.0 EZ CLN MEGD (MISCELLANEOUS) ×4 IMPLANT
ELECTRODE REM PT RTRN 9FT ADLT (ELECTROSURGICAL) ×8 IMPLANT
FELT TEFLON 1X6 (MISCELLANEOUS) ×5 IMPLANT
GAUZE 4X4 16PLY ~~LOC~~+RFID DBL (SPONGE) ×5 IMPLANT
GAUZE SPONGE 4X4 12PLY STRL (GAUZE/BANDAGES/DRESSINGS) ×5 IMPLANT
GAUZE SPONGE 4X4 12PLY STRL LF (GAUZE/BANDAGES/DRESSINGS) ×5 IMPLANT
GEL ULTRASOUND 20GR AQUASONIC (MISCELLANEOUS) ×5 IMPLANT
GLOVE SURG ENC MOIS LTX SZ7 (GLOVE) ×10 IMPLANT
GLOVE SURG ENC TEXT LTX SZ7.5 (GLOVE) ×10 IMPLANT
GLOVE SURG UNDER POLY LF SZ9 (GLOVE) ×5 IMPLANT
GOWN STRL REUS W/ TWL LRG LVL3 (GOWN DISPOSABLE) ×28 IMPLANT
GOWN STRL REUS W/ TWL XL LVL3 (GOWN DISPOSABLE) ×8 IMPLANT
GOWN STRL REUS W/TWL LRG LVL3 (GOWN DISPOSABLE) ×35
GOWN STRL REUS W/TWL XL LVL3 (GOWN DISPOSABLE) ×10
HEMOSTAT POWDER SURGIFOAM 1G (HEMOSTASIS) ×10 IMPLANT
INSERT SUTURE HOLDER (MISCELLANEOUS) ×5 IMPLANT
KIT BASIN OR (CUSTOM PROCEDURE TRAY) ×5 IMPLANT
KIT SUCTION CATH 14FR (SUCTIONS) ×5 IMPLANT
KIT TURNOVER KIT B (KITS) ×5 IMPLANT
KIT VASOVIEW HEMOPRO 2 VH 4000 (KITS) ×5 IMPLANT
LEAD PACING MYOCARDI (MISCELLANEOUS) ×5 IMPLANT
MARKER GRAFT CORONARY BYPASS (MISCELLANEOUS) ×15 IMPLANT
NS IRRIG 1000ML POUR BTL (IV SOLUTION) ×25 IMPLANT
PACK ACCESSORY CANNULA KIT (KITS) ×5 IMPLANT
PACK E OPEN HEART (SUTURE) ×5 IMPLANT
PACK OPEN HEART (CUSTOM PROCEDURE TRAY) ×5 IMPLANT
PAD ARMBOARD 7.5X6 YLW CONV (MISCELLANEOUS) ×10 IMPLANT
PAD ELECT DEFIB RADIOL ZOLL (MISCELLANEOUS) ×5 IMPLANT
PENCIL BUTTON HOLSTER BLD 10FT (ELECTRODE) ×5 IMPLANT
POSITIONER HEAD DONUT 9IN (MISCELLANEOUS) ×5 IMPLANT
PUNCH AORTIC ROTATE 4.0MM (MISCELLANEOUS) ×5 IMPLANT
SET MPS 3-ND DEL (MISCELLANEOUS) ×5 IMPLANT
SHEARS HARMONIC 9CM CVD (BLADE) ×5 IMPLANT
SPONGE T-LAP 18X18 ~~LOC~~+RFID (SPONGE) ×20 IMPLANT
SUPPORT HEART JANKE-BARRON (MISCELLANEOUS) ×5 IMPLANT
SUT BONE WAX W31G (SUTURE) ×5 IMPLANT
SUT ETHIBOND X763 2 0 SH 1 (SUTURE) ×10 IMPLANT
SUT MNCRL AB 3-0 PS2 18 (SUTURE) ×10 IMPLANT
SUT PDS AB 1 CTX 36 (SUTURE) ×10 IMPLANT
SUT PROLENE 4 0 SH DA (SUTURE) ×5 IMPLANT
SUT PROLENE 5 0 C 1 36 (SUTURE) ×15 IMPLANT
SUT PROLENE 7 0 BV 1 (SUTURE) ×10 IMPLANT
SUT PROLENE 7 0 BV1 MDA (SUTURE) ×5 IMPLANT
SUT STEEL 6MS V (SUTURE) ×10 IMPLANT
SUT VIC AB 2-0 CT1 27 (SUTURE)
SUT VIC AB 2-0 CT1 TAPERPNT 27 (SUTURE) IMPLANT
SUT VIC AB 3-0 SH 27 (SUTURE)
SUT VIC AB 3-0 SH 27X BRD (SUTURE) IMPLANT
SUT VIC AB 3-0 X1 27 (SUTURE) ×5 IMPLANT
SYR 50ML SLIP (SYRINGE) IMPLANT
SYSTEM SAHARA CHEST DRAIN ATS (WOUND CARE) ×5 IMPLANT
TAPE CLOTH SURG 4X10 WHT LF (GAUZE/BANDAGES/DRESSINGS) ×5 IMPLANT
TAPE PAPER 2X10 WHT MICROPORE (GAUZE/BANDAGES/DRESSINGS) ×5 IMPLANT
TOWEL GREEN STERILE (TOWEL DISPOSABLE) ×5 IMPLANT
TOWEL GREEN STERILE FF (TOWEL DISPOSABLE) ×5 IMPLANT
TRAY FOLEY SLVR 16FR TEMP STAT (SET/KITS/TRAYS/PACK) ×5 IMPLANT
TUBING LAP HI FLOW INSUFFLATIO (TUBING) ×5 IMPLANT
UNDERPAD 30X36 HEAVY ABSORB (UNDERPADS AND DIAPERS) ×5 IMPLANT
WATER STERILE IRR 1000ML POUR (IV SOLUTION) ×10 IMPLANT

## 2021-03-13 NOTE — Op Note (Signed)
[address]       [place] [address]             [phone]                                          03/13/2021 Patient:  QUINTEL MCCALLA Pre-Op Dx: NSTEMI LM CAD   HTN  Post-op Dx:  same Procedure: CABG X 3.  LIMA LAD, RSVG Diagonal, OM   Endoscopic greater saphenous vein harvest on the right   Surgeon and Role:      * Ople Girgis, Lucile Crater, MD - Primary    Evonnie Pat, PA-C - assisting Assistant: Leretha Pol, PA-C  Anesthesia  general EBL:  565ml Blood Administration: none   Drains: 48 F blake drain: R, L, mediastinal  Wires: Ventricular Counts: correct   Indications: 65 year old gentleman with severe two-vessel coronary artery disease.  He also has in-stent restenosis.  The right-sided is not significant.  On review of his imaging he will need three-vessel bypass which includes LAD the diagonal and the obtuse marginal.  We will harvest left radial artery.  He is tentatively scheduled for 03/13/2021.  We will hold Aggrastat drip the night prior.  Findings: Good lima, small vein.  Attempted to take the right radial, but he did not have a good palmar arch.  Operative Technique: All invasive lines were placed in pre-op holding.  After the risks, benefits and alternatives were thoroughly discussed, the patient was brought to the operative theatre.  Anesthesia was induced, and the patient was prepped and draped in normal sterile fashion.  An appropriate surgical pause was performed, and pre-operative antibiotics were dosed accordingly.  We began with simultaneous incisions along the left leg for harvesting of the greater saphenous vein and the chest for the sternotomy.  In regards to the sternotomy, this was carried down with bovie cautery, and the sternum was divided with a reciprocating saw.  Meticulous hemostasis was obtained.  The left internal thoracic artery was exposed and harvested in in pedicled fashion.  The patient was systemically heparinized,  and the artery was divided distally, and placed in a papaverine sponge.    The sternal elevator was removed, and a retractor was placed.  The pericardium was divided in the midline and fashioned into a cradle with pericardial stitches.   After we confirmed an appropriate ACT, the ascending aorta was cannulated in standard fashion.  The right atrial appendage was used for venous cannulation site.  Cardiopulmonary bypass was initiated, and the heart retractor was placed. The cross clamp was applied, and a dose of anterograde cardioplegia was given with good arrest of the heart.  Next we exposed the lateral wall, and found a good target on the obtuse marginal.  An end to side anastomosis with the vein graft was then created.  Next, we exposed the anterior wall of the heart and identified a good target on diagonal.   An arteriotomy was created.  The vein was anastomosed in an end to side fashion.  Finally, we exposed a good target on the LAD, and fashioned an end to side anastomosis between it and the LITA.  We began to re-warm, and a re-animation dose of cardioplegia was given.  The heart was de-aired, and the cross clamp was removed.  Meticulous hemostasis was obtained.    A partial occludding clamp  was then placed on the ascending aorta, and we created an end to side anastomosis between it and the proximal vein grafts.  The proximal sites were marked with rings.  Hemostasis was obtained, and we separated from cardiopulmonary bypass without event.  The heparin was reversed with protamine.  Chest tubes and wires were placed, and the sternum was re-approximated with sternal wires.  The soft tissue and skin were re-approximated wth absorbable suture.    The patient tolerated the procedure without any immediate complications, and was transferred to the ICU in guarded condition.  Alida Greiner Bary Leriche

## 2021-03-13 NOTE — Progress Notes (Signed)
Progress Note  Patient Name: KINO DUNSWORTH Date of Encounter: 03/13/2021  Primary Cardiologist: Dr. Zandra Abts  Subjective   Awaiting surgery today; no recurrent chest pain  Inpatient Medications    Scheduled Meds:  amLODipine  5 mg Oral Daily   aspirin EC  81 mg Oral Daily   atorvastatin  80 mg Oral Daily   Chlorhexidine Gluconate Cloth  6 each Topical Daily   Chlorhexidine Gluconate Cloth  6 each Topical Once   dicyclomine  10 mg Oral TID AC & HS   epinephrine  0-10 mcg/min Intravenous To OR   FLUoxetine  60 mg Oral Daily   fluticasone furoate-vilanterol  1 puff Inhalation Daily   gabapentin  600 mg Oral BID   heparin-papaverine-plasmalyte irrigation   Irrigation To OR   insulin   Intravenous To OR   irbesartan  150 mg Oral Daily   isosorbide mononitrate  30 mg Oral Daily   [place] Blood Cardioplegia vial (lidocaine/magnesium/mannitol 0.26g-4g-6.4g)   Intracoronary To OR   lipase/protease/amylase  36,000 Units Oral TID WC   metoprolol succinate  75 mg Oral Daily   mupirocin ointment  1 application Nasal BID   pantoprazole  40 mg Oral Daily   phenylephrine  30-200 mcg/min Intravenous To OR   potassium chloride  80 mEq Other To OR   rOPINIRole  2 mg Oral QHS   sodium chloride flush  3 mL Intravenous Q12H   tranexamic acid  15 mg/kg Intravenous To OR   tranexamic acid  2 mg/kg Intracatheter To OR   vitamin B-12  2,000 mcg Oral Daily   Continuous Infusions:  sodium chloride 250 mL (03/13/21 0637)    ceFAZolin (ANCEF) IV      ceFAZolin (ANCEF) IV     dexmedetomidine     heparin 30,000 units/NS 1000 mL solution for CELLSAVER     heparin 1,300 Units/hr (03/13/21 0636)   milrinone     nitroGLYCERIN 15 mcg/min (03/13/21 0600)   nitroGLYCERIN     norepinephrine     tranexamic acid (CYKLOKAPRON) infusion ([facility])     vancomycin     PRN Meds: sodium chloride, acetaminophen, albuterol, nitroGLYCERIN, ondansetron (ZOFRAN) IV, oxyCODONE, sodium chloride flush   Vital  Signs    Vitals:   03/13/21 0500 03/13/21 0600 03/13/21 0700 03/13/21 0748  BP: (!) 133/100 (!) 123/100    Pulse: 68 75  78  Resp: 18 17  18   Temp:   98.1 F (36.7 C)   TempSrc:   Oral   SpO2: 94% 92%    Weight: 66.8 kg     Height:        Intake/Output Summary (Last 24 hours) at 03/13/2021 0822 Last data filed at 03/13/2021 0600 Gross per 24 hour  Intake 1384.29 ml  Output 2700 ml  Net -1315.71 ml    I/O since admission: -[place] Weights   03/11/21 0500 03/12/21 0500 03/13/21 0500  Weight: 67.9 kg 68.4 kg 66.8 kg    Telemetry    Sinus 66; no ectopy- Personally Reviewed  At 07:28 9/28 had burst of NSVT  ECG   03/12/2021 ECG (independently read by me):  NSR at 70 J point elevation with peaked T waves V3-4 c/w previous   03/11/2021 ECG (independently read by me): NSR at 72, T wave inversion I,aVL  Physical Exam    BP (!) 123/100   Pulse 78   Temp 98.1 F (36.7 C) (Oral)   Resp 18   Ht 5\' 10"  (  1.778 m)   Wt 66.8 kg   SpO2 92%   BMI 21.13 kg/m  General: Alert, oriented, no distress.  Skin: normal turgor, no rashes, warm and dry HEENT: Normocephalic, atraumatic. Pupils equal round and reactive to light; sclera anicteric; extraocular muscles intact;  Nose without nasal septal hypertrophy Mouth/Parynx benign; Mallinpatti scale Neck: No JVD, no carotid bruits; normal carotid upstroke Lungs: clear to ausculatation and percussion; no wheezing or rales Chest wall: without tenderness to palpitation Heart: PMI not displaced, RRR, s1 s2 normal, 1/6 systolic murmur, no diastolic murmur, no rubs, gallops, thrills, or heaves Abdomen: soft, nontender; no hepatosplenomehaly, BS+; abdominal aorta nontender and not dilated by palpation. Back: no CVA tenderness Pulses 2+R radial site stable Musculoskeletal: full range of motion, normal strength, no joint deformities Extremities: no clubbing cyanosis or edema, Homan's sign negative  Neurologic: grossly nonfocal; Cranial  nerves grossly wnl Psychologic: Normal mood and affect   Labs    Chemistry Recent Labs  Lab 03/11/21 0109 03/12/21 0258 03/13/21 0030  NA 137 139 136  K 3.2* 3.7 3.5  CL 105 105 103  CO2 23 25 24   GLUCOSE 139* 107* 104*  BUN 8 7* 12  CREATININE 0.74 0.73 0.72  CALCIUM 8.4* 8.5* 8.6*  PROT 5.4*  --   --   ALBUMIN 3.2*  --   --   AST 57*  --   --   ALT 40  --   --   ALKPHOS 84  --   --   BILITOT 0.5  --   --   GFRNONAA >60 >60 >60  ANIONGAP 9 9 9      Hematology Recent Labs  Lab 03/11/21 0109 03/12/21 0258 03/13/21 0030  WBC 9.1 9.7 11.6*  RBC 4.16* 4.23 4.30  HGB 13.7 14.0 14.0  HCT 38.7* 39.1 40.3  MCV 93.0 92.4 93.7  MCH 32.9 33.1 32.6  MCHC 35.4 35.8 34.7  RDW 12.8 12.9 12.7  PLT 132* 118* 127*    Mg: 1.5 > 2.2  HS Trop: 2422 > 3242  Cardiac EnzymesNo results for input(s): TROPONINI in the last 168 hours. No results for input(s): TROPIPOC in the last 168 hours.   BNP Recent Labs  Lab 03/11/21 0109  BNP 341.6*     DDimer No results for input(s): DDIMER in the last 168 hours.   Lipid Panel     Component Value Date/Time   CHOL 85 03/11/2021 0109   CHOL 138 12/13/2017 1603   TRIG 82 03/11/2021 0109   HDL 42 03/11/2021 0109   HDL 34 (L) 12/13/2017 1603   CHOLHDL 2.0 03/11/2021 0109   VLDL 16 03/11/2021 0109   LDLCALC 27 03/11/2021 0109   LDLCALC 43 12/13/2017 1603   LDLDIRECT 51.0 08/29/2019 0236     Radiology    ECHOCARDIOGRAM COMPLETE  Result Date: 03/11/2021    ECHOCARDIOGRAM REPORT   Patient Name:   NORVILLE DANI Date of Exam: 03/11/2021 Medical Rec #:  [id number]      Height:       70.0 in Accession #:    [id number]     Weight:       149.7 lb Date of Birth:  [birth year]      BSA:          1.845 m Patient Age:    41 years       BP:           130/96 mmHg Patient Gender: M  HR:           71 bpm. Exam Location:  Inpatient Procedure: 2D Echo, 3D Echo, Cardiac Doppler, Color Doppler and Strain Analysis Indications:    Acute  myocardial infarction, unspecified I21.9  History:        Patient has prior history of Echocardiogram examinations, most                 recent 12/18/2020. Risk Factors:Current Smoker and Dyslipidemia.                 Acute coronary syndrome, Nonsustained ventricular tachycardia.  Sonographer:    Darlina Sicilian RDCS Referring Phys: [id number] [place]  1. Inferior and septal hypokinesis . Left ventricular ejection fraction, by estimation, is 45 to 50%. The left ventricle has mildly decreased function. The left ventricle has no regional wall motion abnormalities. The left ventricular internal cavity size was mildly dilated. Left ventricular diastolic parameters were normal. The average left ventricular global longitudinal strain is -12.2 %. The global longitudinal strain is abnormal.  2. Right ventricular systolic function is normal. The right ventricular size is normal.  3. Left atrial size was mildly dilated.  4. The mitral valve is abnormal. Mild to moderate mitral valve regurgitation. No evidence of mitral stenosis.  5. The aortic valve is tricuspid. Aortic valve regurgitation is mild. Mild aortic valve sclerosis is present, with no evidence of aortic valve stenosis.  6. The inferior vena cava is normal in size with greater than 50% respiratory variability, suggesting right atrial pressure of 3 mmHg. FINDINGS  Left Ventricle: Inferior and septal hypokinesis. Left ventricular ejection fraction, by estimation, is 45 to 50%. The left ventricle has mildly decreased function. The left ventricle has no regional wall motion abnormalities. The average left ventricular global longitudinal strain is -12.2 %. The global longitudinal strain is abnormal. The left ventricular internal cavity size was mildly dilated. There is no left ventricular hypertrophy. Left ventricular diastolic parameters were normal. Right Ventricle: The right ventricular size is normal. No increase in right ventricular wall thickness.  Right ventricular systolic function is normal. Left Atrium: Left atrial size was mildly dilated. Right Atrium: Right atrial size was normal in size. Pericardium: There is no evidence of pericardial effusion. Mitral Valve: The mitral valve is abnormal. There is mild thickening of the mitral valve leaflet(s). There is mild calcification of the mitral valve leaflet(s). Mild to moderate mitral valve regurgitation. No evidence of mitral valve stenosis. Tricuspid Valve: The tricuspid valve is normal in structure. Tricuspid valve regurgitation is trivial. No evidence of tricuspid stenosis. Aortic Valve: The aortic valve is tricuspid. Aortic valve regurgitation is mild. Mild aortic valve sclerosis is present, with no evidence of aortic valve stenosis. Pulmonic Valve: The pulmonic valve was normal in structure. Pulmonic valve regurgitation is not visualized. No evidence of pulmonic stenosis. Aorta: The aortic root is normal in size and structure. Venous: The inferior vena cava is normal in size with greater than 50% respiratory variability, suggesting right atrial pressure of 3 mmHg. IAS/Shunts: No atrial level shunt detected by color flow Doppler.  LEFT VENTRICLE PLAX 2D LVIDd:         5.50 cm  Diastology LVIDs:         4.60 cm  LV e' medial:    7.29 cm/s LV PW:         0.70 cm  LV E/e' medial:  8.0 LV IVS:        1.00 cm  LV e' lateral:  7.62 cm/s LVOT diam:     2.20 cm  LV E/e' lateral: 7.6 LV SV:         55 LV SV Index:   30       2D Longitudinal Strain LVOT Area:     3.80 cm 2D Strain GLS Avg:     -12.2 %                          3D Volume EF:                         3D EF:        50 %                         LV EDV:       165 ml                         LV ESV:       82 ml                         LV SV:        83 ml RIGHT VENTRICLE RV S prime:     13.20 cm/s TAPSE (M-mode): 2.1 cm LEFT ATRIUM             Index       RIGHT ATRIUM           Index LA diam:        3.90 cm 2.11 cm/m  RA Area:     13.20 cm LA Vol (A2C):    44.7 ml 24.22 ml/m RA Volume:   32.20 ml  17.45 ml/m LA Vol (A4C):   42.8 ml 23.19 ml/m LA Biplane Vol: 44.7 ml 24.22 ml/m  AORTIC VALVE LVOT Vmax:   75.00 cm/s LVOT Vmean:  54.100 cm/s LVOT VTI:    0.145 m  AORTA Ao Root diam: 3.20 cm Ao Asc diam:  3.00 cm MITRAL VALVE MV Area (PHT): 4.89 cm      SHUNTS MV Decel Time: 155 msec      Systemic VTI:  0.14 m MR Peak grad:    114.5 mmHg  Systemic Diam: 2.20 cm MR Mean grad:    77.0 mmHg MR Vmax:         535.00 cm/s MR Vmean:        417.0 cm/s MR PISA:         1.01 cm MR PISA Eff ROA: 7 mm MR PISA Radius:  0.40 cm MV E velocity: 58.10 cm/s MV A velocity: 40.40 cm/s MV E/A ratio:  1.44 Jenkins Rouge MD Electronically signed by Jenkins Rouge MD Signature Date/Time: 03/11/2021/1:08:14 PM    Final    VAS US DOPPLER PRE CABG  Result Date: 03/11/2021 PREOPERATIVE VASCULAR EVALUATION Patient Name:  MANOJ ENRIQUEZ  Date of Exam:   03/11/2021 Medical Rec #: [id number]       Accession #:    [id number] Date of Birth: [birth year]       Patient Gender: M Patient Age:   9 years Exam [facility]  [facility] Procedure:      VAS US DOPPLER PRE CABG Referring Phys: HARRELL LIGHTFOOT --------------------------------------------------------------------------------  Indications:      Pre-CABG. Risk Factors:     Hypertension, hyperlipidemia, coronary artery disease. Comparison Study: no  prior Performing Technologist: Archie Patten RVS  Examination Guidelines: A complete evaluation includes B-mode imaging, spectral Doppler, color Doppler, and power Doppler as needed of all accessible portions of each vessel. Bilateral testing is considered an integral part of a complete examination. Limited examinations for reoccurring indications may be performed as noted.  Right Carotid Findings: +----------+--------+--------+--------+------------+--------+           PSV cm/sEDV cm/sStenosisDescribe    Comments +----------+--------+--------+--------+------------+--------+ CCA Prox  47       11              heterogenous         +----------+--------+--------+--------+------------+--------+ CCA Distal43      11              heterogenous         +----------+--------+--------+--------+------------+--------+ ICA Prox  58      22      1-39%   heterogenous         +----------+--------+--------+--------+------------+--------+ ICA Distal38      16                                   +----------+--------+--------+--------+------------+--------+ ECA       77      11                                   +----------+--------+--------+--------+------------+--------+ +----------+--------+-------+--------+------------+           PSV cm/sEDV cmsDescribeArm Pressure +----------+--------+-------+--------+------------+ Subclavian79                                  +----------+--------+-------+--------+------------+ +---------+--------+--+--------+--+---------+ VertebralPSV cm/s34EDV cm/s11Antegrade +---------+--------+--+--------+--+---------+ Left Carotid Findings: +----------+--------+--------+--------+------------+--------+           PSV cm/sEDV cm/sStenosisDescribe    Comments +----------+--------+--------+--------+------------+--------+ CCA Prox  84      22              heterogenous         +----------+--------+--------+--------+------------+--------+ CCA Distal49      16              heterogenous         +----------+--------+--------+--------+------------+--------+ ICA Prox  45      16      1-39%   heterogenous         +----------+--------+--------+--------+------------+--------+ ICA Distal48      18                                   +----------+--------+--------+--------+------------+--------+ ECA       86      12                                   +----------+--------+--------+--------+------------+--------+ +----------+--------+--------+--------+------------+ SubclavianPSV cm/sEDV cm/sDescribeArm Pressure  +----------+--------+--------+--------+------------+           93                                   +----------+--------+--------+--------+------------+ +---------+--------+--+--------+--+---------+ VertebralPSV cm/s32EDV cm/s14Antegrade +---------+--------+--+--------+--+---------+  ABI Findings: +--------+------------------+-----+---------+--------+ Right   Rt Pressure (mmHg)IndexWaveform Comment  +--------+------------------+-----+---------+--------+ Brachial  triphasic         +--------+------------------+-----+---------+--------+ ATA                            triphasic         +--------+------------------+-----+---------+--------+ PTA                            triphasic         +--------+------------------+-----+---------+--------+ +--------+------------------+-----+---------+-------+ Left    Lt Pressure (mmHg)IndexWaveform Comment +--------+------------------+-----+---------+-------+ Brachial                       triphasic        +--------+------------------+-----+---------+-------+ ATA                            triphasic        +--------+------------------+-----+---------+-------+ PTA                            triphasic        +--------+------------------+-----+---------+-------+  Right Doppler Findings: +--------+--------+-----+---------+--------+ Site    PressureIndexDoppler  Comments +--------+--------+-----+---------+--------+ Brachial             triphasic         +--------+--------+-----+---------+--------+ Radial               triphasic         +--------+--------+-----+---------+--------+ Ulnar                triphasic         +--------+--------+-----+---------+--------+  Left Doppler Findings: +--------+--------+-----+---------+--------+ Site    PressureIndexDoppler  Comments +--------+--------+-----+---------+--------+ Brachial             triphasic          +--------+--------+-----+---------+--------+ Radial               triphasic         +--------+--------+-----+---------+--------+ Ulnar                triphasic         +--------+--------+-----+---------+--------+  Summary: Right Carotid: Velocities in the right ICA are consistent with a 1-39% stenosis. Left Carotid: Velocities in the left ICA are consistent with a 1-39% stenosis. Right Upper Extremity: Doppler waveforms decrease 50% with right radial compression. Doppler waveforms remain within normal limits with right ulnar compression. Left Upper Extremity: Doppler waveforms remain within normal limits with left radial compression. Doppler waveforms remain within normal limits with left ulnar compression.  Electronically signed by Jamelle Haring on 03/11/2021 at 5:20:03 PM.    Final     Cardiac Studies   CONCLUSIONS: Acute coronary syndrome with ostial 99% of circumflex progression.  First marginal 70%. 40 to 50% distal left main 40 to 50% mid LAD with 40% first diagonal Right coronary with diffuse 30 to 40% in-stent restenosis proximal and eccentric 50 to 60% mid to distal stenosis. LV systolic dysfunction with regional wall motion abnormality, and elevated LVEDP of 22 mmHg.  Findings are consistent with acute on chronic combined systolic and diastolic heart failure.   RECOMMENDATIONS:   IV Aggrastat IV heparin IV nitro Notify Dr. Kipp Brood of patient's presence and requested consideration of surgical revascularization.  Could also discuss other treatment options which could include circumflex stent with extension into the left main however it would gel the LAD.  Left main stenting would  not be an optimal long-term strategy for this patient given his age of 44. Was not loaded with P2 Y 12 agent.  Intervention   ECHO: 9/28/200 IMPRESSIONS    1. Inferior and septal hypokinesis . Left ventricular ejection fraction,  by estimation, is 45 to 50%. The left ventricle has mildly  decreased  function. The left ventricle has no regional wall motion abnormalities.  The left ventricular internal cavity  size was mildly dilated. Left ventricular diastolic parameters were  normal. The average left ventricular global longitudinal strain is -12.2  %. The global longitudinal strain is abnormal.   2. Right ventricular systolic function is normal. The right ventricular  size is normal.   3. Left atrial size was mildly dilated.   4. The mitral valve is abnormal. Mild to moderate mitral valve  regurgitation. No evidence of mitral stenosis.   5. The aortic valve is tricuspid. Aortic valve regurgitation is mild.  Mild aortic valve sclerosis is present, with no evidence of aortic valve  stenosis.   6. The inferior vena cava is normal in size with greater than 50%  respiratory variability, suggesting right atrial pressure of 3 mmHg.   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 male with coronary disease, h/o PCI with a bare-metal stent to the RCA in 2013 and residual obstructive disease of an OM on catheterization in 2021, COPD, ongoing smoking, prior gastrointestinal bleeding and H. pylori, hypertension who presented on 03/11/2021 for the evaluation of STEMI.  Assessment & Plan    Day 3 Acute coronary syndrome: Patient developed recurrent chest pain the evening of March 09, 2021.  Chest pain recurred yesterday and became more intense last evening resulting in his presentation.  I reviewed the angiograms.  There is severe multivessel CAD with subtotal ostial stenosis of a large left circumflex vessel.  No recurrent NSVT on increased metoprolol succinate 75 mg,  Seen by Dr Kipp Brood, plan for CABG tomorrow.  Now off aggrastat on heparin; no recurrent chest pain. For CABG later today.  2.  Electrolyte abnormality: Magnesium 1.5 and potassium 3.2.  Mg  2.2; K 3.7 yesterday, K 3.5 today, to get iv KCL 10 meq x2 this am.  3   Hyperlipidemia: Continue high potency statin therapy  with target LDL under 70;preferably in the 50s.  LDL 27   4  Tobacco abuse: Patient started smoking at age 57 and has been and has been smoking for 50 years.  He continues to smoke after his initial stent to his RCA in 2013.  I discussed the importance of complete smoking cessation.  5.  Mildly reduced platelets at 142 > 132 > 118> 127  6. Weight loss: had a traumatic experience 3 months ago with the death of his son, lost 30 lbs.  Signed, Troy Sine, MD, [facility] 03/13/2021, 8:22 AM

## 2021-03-13 NOTE — Anesthesia Preprocedure Evaluation (Addendum)
Anesthesia Evaluation  Patient identified by MRN, date of birth, ID band Patient awake    Reviewed: Allergy & Precautions, NPO status , Patient's Chart, lab work & pertinent test results  Airway Mallampati: II  TM Distance: >3 FB Neck ROM: Full    Dental  (+) Missing, Poor Dentition, Dental Advisory Given   Pulmonary asthma , sleep apnea , COPD, Current Smoker,    breath sounds clear to auscultation       Cardiovascular hypertension, Pt. on medications and Pt. on home beta blockers + CAD, + Past MI and + Cardiac Stents   Rhythm:Regular Rate:Normal     Neuro/Psych PSYCHIATRIC DISORDERS Anxiety Depression    GI/Hepatic Neg liver ROS, hiatal hernia, GERD  Medicated,  Endo/Other  negative endocrine ROS  Renal/GU negative Renal ROS     Musculoskeletal   Abdominal Normal abdominal exam  (+)   Peds  Hematology   Anesthesia Other Findings   Reproductive/Obstetrics                            Anesthesia Physical Anesthesia Plan  ASA: 4  Anesthesia Plan: General   Post-op Pain Management:    Induction: Intravenous  PONV Risk Score and Plan: 2 and Ondansetron and Midazolam  Airway Management Planned: Oral ETT  Additional Equipment: Arterial line, CVP, PA Cath, TEE and Ultrasound Guidance Line Placement  Intra-op Plan:   Post-operative Plan: Post-operative intubation/ventilation  Informed Consent: I have reviewed the patients History and Physical, chart, labs and discussed the procedure including the risks, benefits and alternatives for the proposed anesthesia with the patient or authorized representative who has indicated his/her understanding and acceptance.     Dental advisory given  Plan Discussed with: CRNA  Anesthesia Plan Comments:        Anesthesia Quick Evaluation

## 2021-03-13 NOTE — Transfer of Care (Signed)
Immediate Anesthesia Transfer of Care Note  Patient: [name]  Procedure(s) Performed: CORONARY ARTERY BYPASS GRAFTING (CABG), ON PUMP, TIMES THREE, USING LEFT INTERNAL MAMMARY ARTERY AND RIGHT ENDOSCOPICALLY HARVESTED GREATER SAPHENOUS VEIN (Chest) TRANSESOPHAGEAL ECHOCARDIOGRAM (TEE) ENDOVEIN HARVEST OF GREATER SAPHENOUS VEIN (Right: Leg Upper)  Patient Location: ICU  Anesthesia Type:General  Level of Consciousness: Patient remains intubated per anesthesia plan  Airway & Oxygen Therapy: Patient placed on Ventilator (see vital sign flow sheet for setting)  Post-op Assessment: Report given to RN and Post -op Vital signs reviewed and stable  Post vital signs: Reviewed and stable  Last Vitals:  Vitals Value Taken Time  BP 118/91 03/13/21 1805  Temp 37 C 03/13/21 1812  Pulse    Resp 12 03/13/21 1812  SpO2    Vitals shown include unvalidated device data.  Last Pain:  Vitals:   03/13/21 0700  TempSrc: Oral  PainSc:       Patients Stated Pain Goal: 0 (04/88/89 1694)  Complications: No notable events documented.

## 2021-03-13 NOTE — Progress Notes (Signed)
     [address]       [place] [address]             [phone]       No events  Vitals:   03/13/21 1314 03/13/21 1315  BP:  128/80  Pulse: 70 72  Resp: 17 17  Temp:    SpO2: 97% 97%   Alert NAD Sinus EWOB  OR today for CABG  Raeqwon Lux O Yarden Hillis

## 2021-03-13 NOTE — Progress Notes (Signed)
VC 700, NIF -20, Pt good leak, Pt extubated to 4 lt [place], Sats 98% IS done X 4 with good effort 750 obtained

## 2021-03-13 NOTE — Anesthesia Procedure Notes (Addendum)
Central Venous Catheter Insertion Performed by: Effie Berkshire, MD, anesthesiologist Start/End9/30/2022 11:55 AM, 03/13/2021 12:05 PM Patient location: Pre-op. Preanesthetic checklist: patient identified, IV checked, site marked, risks and benefits discussed, surgical consent, monitors and equipment checked, pre-op evaluation, timeout performed and anesthesia consent Position: Trendelenburg Lidocaine 1% used for infiltration and patient sedated Hand hygiene performed , maximum sterile barriers used  and Seldinger technique used Catheter size: 9 Fr Total catheter length 10. Central line was placed.Sheath introducer Swan type:thermodilution PA Cath depth:50 Procedure performed using ultrasound guided technique. Ultrasound Notes:anatomy identified, needle tip was noted to be adjacent to the nerve/plexus identified, no ultrasound evidence of intravascular and/or intraneural injection and image(s) printed for medical record Attempts: 1 Following insertion, line sutured, dressing applied and Biopatch. Post procedure assessment: blood return through all ports, free fluid flow and no air  Patient tolerated the procedure well with no immediate complications.

## 2021-03-13 NOTE — Anesthesia Procedure Notes (Signed)
Arterial Line Insertion Start/End9/30/2022 12:10 PM Performed by: Carolan Clines, CRNA, CRNA  Patient location: Pre-op. Preanesthetic checklist: patient identified, IV checked, site marked, risks and benefits discussed, surgical consent, monitors and equipment checked, pre-op evaluation, timeout performed and anesthesia consent Lidocaine 1% used for infiltration Right, radial was placed Catheter size: 20 G Hand hygiene performed  and maximum sterile barriers used   Attempts: 1 Procedure performed without using ultrasound guided technique. Following insertion, dressing applied and Biopatch. Post procedure assessment: normal and unchanged  Patient tolerated the procedure well with no immediate complications.

## 2021-03-13 NOTE — Anesthesia Procedure Notes (Signed)
Procedure Name: Intubation Date/Time: 03/13/2021 1:42 PM Performed by: Carolan Clines, CRNA Pre-anesthesia Checklist: Patient identified, Emergency Drugs available, Suction available and Patient being monitored Patient Re-evaluated:Patient Re-evaluated prior to induction Oxygen Delivery Method: Circle System Utilized Preoxygenation: Pre-oxygenation with 100% oxygen Induction Type: IV induction Ventilation: Mask ventilation without difficulty Laryngoscope Size: Mac and 4 Grade View: Grade I Tube type: Oral Tube size: 8.0 mm Number of attempts: 1 Airway Equipment and Method: Stylet Placement Confirmation: ETT inserted through vocal cords under direct vision, positive ETCO2 and breath sounds checked- equal and bilateral Secured at: 23 cm Tube secured with: Tape Dental Injury: Teeth and Oropharynx as per pre-operative assessment

## 2021-03-13 NOTE — Progress Notes (Signed)
Patient ID: OVILA LEPAGE, male   DOB: 10-17-1955, 65 y.o.   MRN: [id number]  TCTS Evening Rounds:   Hemodynamically stable  CI = 2.5  Has started to wake up on vent.   Urine output good  CT output low  CBC    Component Value Date/Time   WBC 12.0 (H) 03/13/2021 1821   RBC 4.34 03/13/2021 1821   HGB 14.4 03/13/2021 1821   HGB 14.8 12/13/2017 1603   HCT 40.8 03/13/2021 1821   HCT 41.8 12/13/2017 1603   PLT 86 (L) 03/13/2021 1821   PLT 186 12/13/2017 1603   MCV 94.0 03/13/2021 1821   MCV 92 12/13/2017 1603   MCH 33.2 03/13/2021 1821   MCHC 35.3 03/13/2021 1821   RDW 12.5 03/13/2021 1821   RDW 13.9 12/13/2017 1603   LYMPHSABS 2,891 03/09/2019 0950   LYMPHSABS 2.4 12/13/2017 1603   MONOABS 0.4 12/16/2017 1310   EOSABS 59 03/09/2019 0950   EOSABS 0.1 12/13/2017 1603   BASOSABS 47 03/09/2019 0950   BASOSABS 0.0 12/13/2017 1603     BMET    Component Value Date/Time   NA 139 03/13/2021 1819   NA 141 05/22/2020 0951   K 3.9 03/13/2021 1819   CL 105 03/13/2021 1701   CO2 24 03/13/2021 0030   GLUCOSE 92 03/13/2021 1701   BUN 7 (L) 03/13/2021 1701   BUN 16 05/22/2020 0951   CREATININE 0.50 (L) 03/13/2021 1701   CREATININE 0.72 06/20/2014 0748   CALCIUM 8.6 (L) 03/13/2021 0030   GFRNONAA >60 03/13/2021 0030     A/P:  Stable postop course. Continue current plans. Wean to extubate as tolerated.

## 2021-03-13 NOTE — Progress Notes (Signed)
ANTICOAGULATION CONSULT NOTE   Pharmacy Consult for Heparin, Tirofiban Indication: chest pain/ACS, awaiting CABG  No Known Allergies  Patient Measurements: Height: 5\' 10"  (177.8 cm) Weight: 66.8 kg (147 lb 4.3 oz) IBW/kg (Calculated) : 73  Vital Signs: Temp: 98.1 F (36.7 C) (09/30 0700) Temp Source: Oral (09/30 0700) BP: 123/100 (09/30 0600) Pulse Rate: 75 (09/30 0600)  Labs: Recent Labs    03/10/21 2058 03/10/21 2250 03/11/21 0109 03/11/21 1550 03/12/21 0258 03/12/21 1054 03/12/21 2043 03/13/21 0030  HGB 15.4  --  13.7  --  14.0  --   --  14.0  HCT 43.2  --  38.7*  --  39.1  --   --  40.3  PLT 142*  --  132*  --  118*  --   --  127*  HEPARINUNFRC  --   --   --    < > 0.13* 0.20* 0.29* 0.37  CREATININE 0.83  --  0.74  --  0.73  --   --  0.72  TROPONINIHS 3,242* 2,422*  --   --   --   --   --   --    < > = values in this interval not displayed.     Estimated Creatinine Clearance: 88.1 mL/min (by C-G formula based on SCr of 0.72 mg/dL).   Medical History: Past Medical History:  Diagnosis Date   Anxiety    Anxiety and depression    ASCVD (arteriosclerotic cardiovascular disease)    a. s/p PTCA to LCx in 1993 b. BMS to RCA in 01/2012 with residual 80% OM2 stenosis and medical management recommended c. 08/2019: cath showing patent stents with occluded OM2 --> medical management.    Asthma    CAD (coronary artery disease)    Carcinoma in situ of colon 2004   rectal polyp   Colitis, ischemic ([place]) 2011   COPD (chronic obstructive pulmonary disease) (HCC)    mild ;excercise induced hypoxemia by cp stress test ;asthma ,bronchitis,   Depression    Diverticulosis    Gastritis 12/30/10   EGD Dr [name]   GERD (gastroesophageal reflux disease)    GI bleed    H. 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  CABG scheduled for today at noon.   CBC stable. PLT stable at 127K Heparin level therapeutic at 0.37 (on 1300 units/hr).  No s/sx of bleeds.   Goal of Therapy:  Heparin Level 0.3-0.7 Monitor platelets by anticoagulation protocol: Yes   Plan:  Continue 1300 units/hr with CABG later today. No repeat level needed. Continue to monitor for signs and symptoms of bleed.   Cyd Silence  Pharm D. [place]

## 2021-03-13 NOTE — Procedures (Signed)
Extubation Procedure Note  Patient Details:   Name: [name] DOB: 1956-03-10 MRN: [id number]   Airway Documentation:  Airway (Active)     Airway 8 mm (Active)  Secured at (cm) 25 cm 03/13/21 1942  Measured From Lips 03/13/21 1942  Secured Location Right 03/13/21 1942  Secured By Pink Tape 03/13/21 1942  Prone position No 03/13/21 1942  Cuff Pressure (cm H2O) MOV (Manual Technique) 03/13/21 1942  Site Condition Dry 03/13/21 1942   Vent end date: (not recorded) Vent end time: (not recorded)   Evaluation  O2 sats: stable throughout Complications: No apparent complications Patient did tolerate procedure well. Bilateral Breath Sounds: Clear, Diminished   Yes  Chriss Driver [facility] 03/13/2021, 11:01 PM

## 2021-03-14 ENCOUNTER — Inpatient Hospital Stay (HOSPITAL_COMMUNITY): Payer: Medicare PPO

## 2021-03-14 LAB — GLUCOSE, CAPILLARY
Glucose-Capillary: 109 mg/dL — ABNORMAL HIGH (ref 70–99)
Glucose-Capillary: 111 mg/dL — ABNORMAL HIGH (ref 70–99)
Glucose-Capillary: 113 mg/dL — ABNORMAL HIGH (ref 70–99)
Glucose-Capillary: 115 mg/dL — ABNORMAL HIGH (ref 70–99)
Glucose-Capillary: 116 mg/dL — ABNORMAL HIGH (ref 70–99)
Glucose-Capillary: 116 mg/dL — ABNORMAL HIGH (ref 70–99)
Glucose-Capillary: 118 mg/dL — ABNORMAL HIGH (ref 70–99)
Glucose-Capillary: 123 mg/dL — ABNORMAL HIGH (ref 70–99)
Glucose-Capillary: 125 mg/dL — ABNORMAL HIGH (ref 70–99)
Glucose-Capillary: 129 mg/dL — ABNORMAL HIGH (ref 70–99)
Glucose-Capillary: 136 mg/dL — ABNORMAL HIGH (ref 70–99)
Glucose-Capillary: 138 mg/dL — ABNORMAL HIGH (ref 70–99)
Glucose-Capillary: 95 mg/dL (ref 70–99)

## 2021-03-14 LAB — BASIC METABOLIC PANEL
Anion gap: 6 (ref 5–15)
Anion gap: 9 (ref 5–15)
BUN: 9 mg/dL (ref 8–23)
BUN: 9 mg/dL (ref 8–23)
CO2: 22 mmol/L (ref 22–32)
CO2: 25 mmol/L (ref 22–32)
Calcium: 7.9 mg/dL — ABNORMAL LOW (ref 8.9–10.3)
Calcium: 8.4 mg/dL — ABNORMAL LOW (ref 8.9–10.3)
Chloride: 106 mmol/L (ref 98–111)
Chloride: 99 mmol/L (ref 98–111)
Creatinine, Ser: 0.69 mg/dL (ref 0.61–1.24)
Creatinine, Ser: 0.64 mg/dL (ref 0.61–1.24)
GFR, Estimated: >60 mL/min (ref >60)
GFR, Estimated: >60 mL/min (ref >60)
Glucose, Bld: 103 mg/dL — ABNORMAL HIGH (ref 70–99)
Glucose, Bld: 144 mg/dL — ABNORMAL HIGH (ref 70–99)
Potassium: 4.4 mmol/L (ref 3.5–5.1)
Potassium: 3.8 mmol/L (ref 3.5–5.1)
Sodium: 134 mmol/L — ABNORMAL LOW (ref 135–145)
Sodium: 133 mmol/L — ABNORMAL LOW (ref 135–145)

## 2021-03-14 LAB — CBC
HCT: 36.7 % — ABNORMAL LOW (ref 39.0–52.0)
HCT: 37.6 % — ABNORMAL LOW (ref 39.0–52.0)
Hemoglobin: 12.9 g/dL — ABNORMAL LOW (ref 13.0–17.0)
Hemoglobin: 12.9 g/dL — ABNORMAL LOW (ref 13.0–17.0)
MCH: 33.5 pg (ref 26.0–34.0)
MCH: 32.7 pg (ref 26.0–34.0)
MCHC: 35.1 g/dL (ref 30.0–36.0)
MCHC: 34.3 g/dL (ref 30.0–36.0)
MCV: 95.3 fL (ref 80.0–100.0)
MCV: 95.4 fL (ref 80.0–100.0)
Platelets: 116 10*3/uL — ABNORMAL LOW (ref 150–400)
Platelets: 118 10*3/uL — ABNORMAL LOW (ref 150–400)
RBC: 3.85 MIL/uL — ABNORMAL LOW (ref 4.22–5.81)
RBC: 3.94 MIL/uL — ABNORMAL LOW (ref 4.22–5.81)
RDW: 12.7 % (ref 11.5–15.5)
RDW: 12.8 % (ref 11.5–15.5)
WBC: 14.5 10*3/uL — ABNORMAL HIGH (ref 4.0–10.5)
WBC: 12.6 10*3/uL — ABNORMAL HIGH (ref 4.0–10.5)
nRBC: 0 % (ref 0.0–0.2)
nRBC: 0 % (ref 0.0–0.2)

## 2021-03-14 LAB — POCT I-STAT 7, (LYTES, BLD GAS, ICA,H+H)
Acid-Base Excess: 2 mmol/L (ref 0.0–2.0)
Acid-base deficit: 5 mmol/L — ABNORMAL HIGH (ref 0.0–2.0)
Bicarbonate: 20.6 mmol/L (ref 20.0–28.0)
Bicarbonate: 23.1 mmol/L (ref 20.0–28.0)
Calcium, Ion: 1.12 mmol/L — ABNORMAL LOW (ref 1.15–1.40)
Calcium, Ion: 1.08 mmol/L — ABNORMAL LOW (ref 1.15–1.40)
HCT: 36 % — ABNORMAL LOW (ref 39.0–52.0)
HCT: 37 % — ABNORMAL LOW (ref 39.0–52.0)
Hemoglobin: 12.2 g/dL — ABNORMAL LOW (ref 13.0–17.0)
Hemoglobin: 12.6 g/dL — ABNORMAL LOW (ref 13.0–17.0)
O2 Saturation: 92 %
O2 Saturation: 92 %
Patient temperature: 37.4
Patient temperature: 98.4
Potassium: 4.2 mmol/L (ref 3.5–5.1)
Potassium: 3.2 mmol/L — ABNORMAL LOW (ref 3.5–5.1)
Sodium: 139 mmol/L (ref 135–145)
Sodium: 134 mmol/L — ABNORMAL LOW (ref 135–145)
TCO2: 22 mmol/L (ref 22–32)
TCO2: 24 mmol/L (ref 22–32)
pCO2 arterial: 38.6 mmHg (ref 32.0–48.0)
pCO2 arterial: 24.2 mmHg — ABNORMAL LOW (ref 32.0–48.0)
pH, Arterial: 7.337 — ABNORMAL LOW (ref 7.350–7.450)
pH, Arterial: 7.586 — ABNORMAL HIGH (ref 7.350–7.450)
pO2, Arterial: 69 mmHg — ABNORMAL LOW (ref 83.0–108.0)
pO2, Arterial: 51 mmHg — ABNORMAL LOW (ref 83.0–108.0)

## 2021-03-14 LAB — MAGNESIUM
Magnesium: 2.5 mg/dL — ABNORMAL HIGH (ref 1.7–2.4)
Magnesium: 2.1 mg/dL (ref 1.7–2.4)

## 2021-03-14 LAB — POCT I-STAT, CHEM 8
BUN: 9 mg/dL (ref 8–23)
Calcium, Ion: 1.08 mmol/L — ABNORMAL LOW (ref 1.15–1.40)
Chloride: 100 mmol/L (ref 98–111)
Creatinine, Ser: 0.5 mg/dL — ABNORMAL LOW (ref 0.61–1.24)
Glucose, Bld: 129 mg/dL — ABNORMAL HIGH (ref 70–99)
HCT: 37 % — ABNORMAL LOW (ref 39.0–52.0)
Hemoglobin: 12.6 g/dL — ABNORMAL LOW (ref 13.0–17.0)
Potassium: 3.2 mmol/L — ABNORMAL LOW (ref 3.5–5.1)
Sodium: 133 mmol/L — ABNORMAL LOW (ref 135–145)
TCO2: 22 mmol/L (ref 22–32)

## 2021-03-14 LAB — CBG MONITORING, ED: Glucose-Capillary: 129 mg/dL — ABNORMAL HIGH (ref 70–99)

## 2021-03-14 LAB — ECHO INTRAOPERATIVE TEE
Height: 70 in
Weight: 2356.28 oz

## 2021-03-14 MED ORDER — PHENYLEPHRINE HCL-NACL 20-0.9 MG/250ML-% IV SOLN: 30.0000 ug/min | INTRAVENOUS | Status: DC

## 2021-03-14 MED ORDER — POTASSIUM CHLORIDE CRYS ER 20 MEQ PO TBCR
20.0000 meq | EXTENDED_RELEASE_TABLET | Freq: Two times a day (BID) | ORAL | Status: AC
  Administered 2021-03-14: 20 meq via ORAL
  Filled 2021-03-14 (×2): qty 1

## 2021-03-14 MED ORDER — NALOXONE HCL 0.4 MG/ML IJ SOLN
INTRAMUSCULAR | Status: AC
  Administered 2021-03-14: 0.4 mg
  Filled 2021-03-14: qty 1

## 2021-03-14 MED ORDER — MUPIROCIN 2 % EX OINT
1.0000 "application " | TOPICAL_OINTMENT | Freq: Two times a day (BID) | CUTANEOUS | Status: AC
  Administered 2021-03-14 – 2021-03-18 (×10): 1 via NASAL
  Filled 2021-03-14 (×2): qty 22

## 2021-03-14 MED ORDER — FUROSEMIDE 10 MG/ML IJ SOLN
40.0000 mg | Freq: Two times a day (BID) | INTRAMUSCULAR | Status: AC
  Administered 2021-03-14 (×2): 40 mg via INTRAVENOUS
  Filled 2021-03-14 (×2): qty 4

## 2021-03-14 MED ORDER — IOHEXOL 350 MG/ML SOLN
75.0000 mL | Freq: Once | INTRAVENOUS | Status: AC | PRN
  Administered 2021-03-14: 75 mL via INTRAVENOUS

## 2021-03-14 MED ORDER — INSULIN ASPART 100 UNIT/ML IJ SOLN
0.0000 [IU] | INTRAMUSCULAR | Status: DC
  Administered 2021-03-14 – 2021-03-15 (×5): 2 [IU] via SUBCUTANEOUS
  Administered 2021-03-15: 4 [IU] via SUBCUTANEOUS

## 2021-03-14 MED ORDER — PANTOPRAZOLE SODIUM 40 MG PO TBEC
40.0000 mg | DELAYED_RELEASE_TABLET | Freq: Every day | ORAL | Status: DC
  Administered 2021-03-14 – 2021-03-19 (×7): 40 mg via ORAL
  Filled 2021-03-14 (×6): qty 1

## 2021-03-14 MED ORDER — LEVETIRACETAM IN NACL 1500 MG/100ML IV SOLN
1500.0000 mg | Freq: Once | INTRAVENOUS | Status: AC
  Administered 2021-03-15: 1500 mg via INTRAVENOUS
  Filled 2021-03-14: qty 100

## 2021-03-14 MED ORDER — POTASSIUM CHLORIDE 10 MEQ/50ML IV SOLN
10.0000 meq | INTRAVENOUS | Status: AC
  Administered 2021-03-14 – 2021-03-15 (×3): 10 meq via INTRAVENOUS
  Filled 2021-03-14 (×3): qty 50

## 2021-03-14 MED ORDER — CHLORHEXIDINE GLUCONATE CLOTH 2 % EX PADS
6.0000 | MEDICATED_PAD | Freq: Every day | CUTANEOUS | Status: AC
  Administered 2021-03-15 – 2021-03-17 (×3): 6 via TOPICAL

## 2021-03-14 NOTE — Progress Notes (Signed)
Patient ID: [name], male   DOB: July 01, 1955, 65 y.o.   MRN: [id number] TCTS Evening Rounds:  Hemodynamically stable today. Ambulated well  Diuresing.  CBC    Component Value Date/Time   WBC 12.6 (H) 03/14/2021 1617   RBC 3.94 (L) 03/14/2021 1617   HGB 12.9 (L) 03/14/2021 1617   HGB 14.8 12/13/2017 1603   HCT 37.6 (L) 03/14/2021 1617   HCT 41.8 12/13/2017 1603   PLT 118 (L) 03/14/2021 1617   PLT 186 12/13/2017 1603   MCV 95.4 03/14/2021 1617   MCV 92 12/13/2017 1603   MCH 32.7 03/14/2021 1617   MCHC 34.3 03/14/2021 1617   RDW 12.8 03/14/2021 1617   RDW 13.9 12/13/2017 1603   LYMPHSABS 2,891 03/09/2019 0950   LYMPHSABS 2.4 12/13/2017 1603   MONOABS 0.4 12/16/2017 1310   EOSABS 59 03/09/2019 0950   EOSABS 0.1 12/13/2017 1603   BASOSABS 47 03/09/2019 0950   BASOSABS 0.0 12/13/2017 1603   BMET pending this pm.

## 2021-03-14 NOTE — Significant Event (Addendum)
Rapid Response Event Note   Reason for Call :  Asked to see pt as a second set of eyes for questionable stroke.  Pt is POD 1 CABG. He was alert and oriented talking to the RN at 2100. Around 2226, he had an acute change in mental status and RRT was asked to assist with neuro/possible stroke evaluation.  Initial Focused Assessment:  On arrival pt had a blank stare on his face. He would answer some questions by shaking his head yes or no. He would follow some commands. His BUE strength was weak but equal. He would not move his BLE to painful stimulation. Pupils 5, equal and brisk. NIH-18  VS: T-98.1, HR-88, BP-139/86, RR-29, SpO2-97% on 4L [place].  T/o exam, pt's mental status worsened and he began to not respond to verbal or painful stimuli, though his eyes were opened, he would blink to threat, and was protecting his airway.    Dr. Cyndia Bent paged at 2250 and asked that a Code Stroke be initiated. Dr. Lorrin Goodell to bedside and Code Stroke initiated @ 2304 for weakness/mental status change, [id number].   Interventions:  CBG-115 ABG-7.58/24.2/51/23.1 Narcan 0.4mg  IV x 1-MS did not change Code Stroke initiated  at 2304 per Dr. Cyndia Bent CT Head-negative, CTA-no LVO Ceribell placed on pt Keppra 1500mg  IV x 1 Plan of Care:  Dr. Lorrin Goodell to review Ceribell. Keppra ordered. Please update Dr. Cyndia Bent as to happenings. Continue to monitor pt. Call RRT if further assistance needed.   Event Summary:   MD Notified: Dr. Cyndia Bent notified, Dr. Carmine Savoy) consulted and came to bedside Call Warner, Mitsuko Luera Anderson, RN

## 2021-03-14 NOTE — Progress Notes (Signed)
EKG CRITICAL VALUE     12 lead EKG performed.  Critical value noted.  Gwyndolyn Saxon, RN notified.   Willaim Rayas, CCT 03/14/2021 9:09 AM

## 2021-03-14 NOTE — Progress Notes (Signed)
1 Day Post-Op Procedure(s) (LRB): CORONARY ARTERY BYPASS GRAFTING (CABG), ON PUMP, TIMES THREE, USING LEFT INTERNAL MAMMARY ARTERY AND RIGHT ENDOSCOPICALLY HARVESTED GREATER SAPHENOUS VEIN (N/A) TRANSESOPHAGEAL ECHOCARDIOGRAM (TEE) (N/A) ENDOVEIN HARVEST OF GREATER SAPHENOUS VEIN (Right) Subjective: Chest wall pain.  Objective: Vital signs in last 24 hours: Temp:  [98.6 F (37 C)-100.4 F (38 C)] 99.7 F (37.6 C) (10/01 0800) Pulse Rate:  [64-85] 69 (10/01 0800) Cardiac Rhythm: Normal sinus rhythm (10/01 0715) Resp:  [12-28] 22 (10/01 0800) BP: (84-161)/(67-100) 94/76 (10/01 0800) SpO2:  [91 %-100 %] 100 % (10/01 0800) Arterial Line BP: (83-161)/(45-93) 97/49 (10/01 0800) FiO2 (%):  [40 %-50 %] 40 % (09/30 2213) Weight:  [67.8 kg] 67.8 kg (10/01 0500)  Hemodynamic parameters for last 24 hours: CVP:  [4 mmHg-19 mmHg] 8 mmHg  Intake/Output from previous day: 09/30 0701 - 10/01 0700 In: 4186.9 [I.V.:1976.3; Blood:1045; IV Piggyback:1165.6] Out: 0388 [Urine:3805; Emesis/NG output:150; Chest Tube:389] Intake/Output this shift: Total I/O In: 55.5 [I.V.:55.5] Out: -   General appearance: alert and cooperative Neurologic: intact Heart: regular rate and rhythm, S1, S2 normal, no murmur Lungs: clear to auscultation bilaterally Extremities: edema mild Wound: incisions ok  Lab Results: Recent Labs    03/13/21 1821 03/13/21 2009 03/14/21 0102 03/14/21 0353  WBC 12.0*  --   --  14.5*  HGB 14.4   < > 12.2* 12.9*  HCT 40.8   < > 36.0* 36.7*  PLT 86*  --   --  116*   < > = values in this interval not displayed.   BMET:  Recent Labs    03/13/21 0030 03/13/21 1356 03/13/21 1701 03/13/21 1819 03/14/21 0102 03/14/21 0353  NA 136   < > 138   < > 139 134*  K 3.5   < > 3.9   < > 4.2 4.4  CL 103   < > 105  --   --  106  CO2 24  --   --   --   --  22  GLUCOSE 104*   < > 92  --   --  103*  BUN 12   < > 7*  --   --  9  CREATININE 0.72   < > 0.50*  --   --  0.69  CALCIUM 8.6*   --   --   --   --  7.9*   < > = values in this interval not displayed.    PT/INR:  Recent Labs    03/13/21 1821  LABPROT 16.4*  INR 1.3*   ABG    Component Value Date/Time   PHART 7.337 (L) 03/14/2021 0102   HCO3 20.6 03/14/2021 0102   TCO2 22 03/14/2021 0102   ACIDBASEDEF 5.0 (H) 03/14/2021 0102   O2SAT 92.0 03/14/2021 0102   CBG (last 3)  Recent Labs    03/14/21 0358 03/14/21 0503 03/14/21 0656  GLUCAP 111* 113* 136*   CXR: clear  ECG: sinus, anteroseptal ST changes present preop.  Assessment/Plan: S/P Procedure(s) (LRB): CORONARY ARTERY BYPASS GRAFTING (CABG), ON PUMP, TIMES THREE, USING LEFT INTERNAL MAMMARY ARTERY AND RIGHT ENDOSCOPICALLY HARVESTED GREATER SAPHENOUS VEIN (N/A) TRANSESOPHAGEAL ECHOCARDIOGRAM (TEE) (N/A) ENDOVEIN HARVEST OF GREATER SAPHENOUS VEIN (Right)  POD 1 Hemodynamically stable with CI 2.9, CVP 8. Wean off nicardipine. Resume low dose BB.  Volume excess: Wt is only a couple lbs over preop. Will diurese some today.  Keep chest tubes in.  Glucose under good control and no hx of DM with normal Hgb  A1c. DC insulin drip, start CBG's with SSI and likely stop that tomorrow.  IS, OOB, mobilize.  ASA and Plavix at discharge with STEMI.   LOS: 3 days    Gaye Pollack 03/14/2021

## 2021-03-14 NOTE — Anesthesia Postprocedure Evaluation (Signed)
Anesthesia Post Note  Patient: RODD HEFT  Procedure(s) Performed: CORONARY ARTERY BYPASS GRAFTING (CABG), ON PUMP, TIMES THREE, USING LEFT INTERNAL MAMMARY ARTERY AND RIGHT ENDOSCOPICALLY HARVESTED GREATER SAPHENOUS VEIN (Chest) TRANSESOPHAGEAL ECHOCARDIOGRAM (TEE) ENDOVEIN HARVEST OF GREATER SAPHENOUS VEIN (Right: Leg Upper)     Patient location during evaluation: SICU Anesthesia Type: General Level of consciousness: awake Pain management: pain level controlled Vital Signs Assessment: post-procedure vital signs reviewed and stable Respiratory status: spontaneous breathing Cardiovascular status: stable Postop Assessment: no apparent nausea or vomiting Anesthetic complications: no Comments: Some incisional pain.    No notable events documented.  Last Vitals:  Vitals:   03/14/21 0700 03/14/21 0800  BP: 117/79 94/76  Pulse: 75 69  Resp: (!) 25 (!) 22  Temp: (!) 38 C 37.6 C  SpO2: 100% 100%    Last Pain:  Vitals:   03/14/21 0816  TempSrc:   PainSc: [place]

## 2021-03-15 ENCOUNTER — Inpatient Hospital Stay (HOSPITAL_COMMUNITY): Payer: Medicare PPO

## 2021-03-15 DIAGNOSIS — R29818 Other symptoms and signs involving the nervous system: Secondary | ICD-10-CM

## 2021-03-15 DIAGNOSIS — R4689 Other symptoms and signs involving appearance and behavior: Secondary | ICD-10-CM

## 2021-03-15 DIAGNOSIS — R569 Unspecified convulsions: Secondary | ICD-10-CM

## 2021-03-15 DIAGNOSIS — R4189 Other symptoms and signs involving cognitive functions and awareness: Secondary | ICD-10-CM | POA: Diagnosis not present

## 2021-03-15 LAB — TYPE AND SCREEN
ABO/RH(D): O POS
Antibody Screen: NEGATIVE
Unit division: 0
Unit division: 0

## 2021-03-15 LAB — CBC
HCT: 35.6 % — ABNORMAL LOW (ref 39.0–52.0)
Hemoglobin: 12.6 g/dL — ABNORMAL LOW (ref 13.0–17.0)
MCH: 33.2 pg (ref 26.0–34.0)
MCHC: 35.4 g/dL (ref 30.0–36.0)
MCV: 93.9 fL (ref 80.0–100.0)
Platelets: 113 10*3/uL — ABNORMAL LOW (ref 150–400)
RBC: 3.79 MIL/uL — ABNORMAL LOW (ref 4.22–5.81)
RDW: 12.7 % (ref 11.5–15.5)
WBC: 11.7 10*3/uL — ABNORMAL HIGH (ref 4.0–10.5)
nRBC: 0 % (ref 0.0–0.2)

## 2021-03-15 LAB — GLUCOSE, CAPILLARY
Glucose-Capillary: 124 mg/dL — ABNORMAL HIGH (ref 70–99)
Glucose-Capillary: 123 mg/dL — ABNORMAL HIGH (ref 70–99)
Glucose-Capillary: 139 mg/dL — ABNORMAL HIGH (ref 70–99)
Glucose-Capillary: 177 mg/dL — ABNORMAL HIGH (ref 70–99)
Glucose-Capillary: 109 mg/dL — ABNORMAL HIGH (ref 70–99)
Glucose-Capillary: 141 mg/dL — ABNORMAL HIGH (ref 70–99)

## 2021-03-15 LAB — BASIC METABOLIC PANEL
Anion gap: 8 (ref 5–15)
BUN: 8 mg/dL (ref 8–23)
CO2: 23 mmol/L (ref 22–32)
Calcium: 7.6 mg/dL — ABNORMAL LOW (ref 8.9–10.3)
Chloride: 103 mmol/L (ref 98–111)
Creatinine, Ser: 0.56 mg/dL — ABNORMAL LOW (ref 0.61–1.24)
GFR, Estimated: >60 mL/min (ref >60)
Glucose, Bld: 110 mg/dL — ABNORMAL HIGH (ref 70–99)
Potassium: 3.5 mmol/L (ref 3.5–5.1)
Sodium: 134 mmol/L — ABNORMAL LOW (ref 135–145)

## 2021-03-15 LAB — BPAM RBC
Blood Product Expiration Date: 202210302359
Blood Product Expiration Date: 202210302359
Unit Type and Rh: 5100
Unit Type and Rh: 5100

## 2021-03-15 LAB — AMMONIA: Ammonia: <10 umol/L (ref 9–35)

## 2021-03-15 MED ORDER — ALBUTEROL SULFATE (2.5 MG/3ML) 0.083% IN NEBU
2.5000 mg | INHALATION_SOLUTION | RESPIRATORY_TRACT | Status: DC | PRN
  Administered 2021-03-15: 2.5 mg via RESPIRATORY_TRACT

## 2021-03-15 MED ORDER — TRAMADOL HCL 50 MG PO TABS
50.0000 mg | ORAL_TABLET | ORAL | Status: DC | PRN
  Administered 2021-03-16 – 2021-03-17 (×2): 50 mg via ORAL
  Filled 2021-03-15 (×2): qty 1

## 2021-03-15 MED ORDER — NICOTINE 21 MG/24HR TD PT24
21.0000 mg | MEDICATED_PATCH | Freq: Every day | TRANSDERMAL | Status: DC
  Administered 2021-03-15 – 2021-03-19 (×5): 21 mg via TRANSDERMAL
  Filled 2021-03-15 (×5): qty 1

## 2021-03-15 MED ORDER — FLUOXETINE HCL 20 MG PO CAPS
60.0000 mg | ORAL_CAPSULE | Freq: Every day | ORAL | Status: DC
  Administered 2021-03-15 – 2021-03-19 (×5): 60 mg via ORAL
  Filled 2021-03-15 (×5): qty 3

## 2021-03-15 MED ORDER — ALBUTEROL SULFATE (2.5 MG/3ML) 0.083% IN NEBU
INHALATION_SOLUTION | RESPIRATORY_TRACT | Status: AC
  Filled 2021-03-15: qty 3

## 2021-03-15 MED ORDER — POTASSIUM CHLORIDE 10 MEQ/50ML IV SOLN
10.0000 meq | INTRAVENOUS | Status: AC
  Administered 2021-03-15 (×3): 10 meq via INTRAVENOUS
  Filled 2021-03-15 (×3): qty 50

## 2021-03-15 MED ORDER — ALPRAZOLAM 0.5 MG PO TABS
0.5000 mg | ORAL_TABLET | Freq: Three times a day (TID) | ORAL | Status: DC | PRN
  Administered 2021-03-16 (×2): 0.5 mg via ORAL
  Filled 2021-03-15 (×2): qty 1

## 2021-03-15 NOTE — Hospital Course (Addendum)
  History of Present Illness:    At time of surgical consultation. 65 yo male with Hx of CAD s/p PCI present with unstable angina.  He was ruled in for STEMI and taken to the cath lab where he was noted to have three-vessel coronary artery disease and in-stent restenosis.  He was not deemed a good candidate for repeat PCI.  CTS was consulted to assist with management.  He was placed on an Aggrastat drip and has been comfortable.  Hospital course:  Patient was medically stabilized on the cardiology service and felt stable to proceed with surgery.  On 03/13/2021 the patient was taken the operating room at which time he underwent CABG x3 by Dr. Kipp Brood.  He tolerated the procedure well was taken to the surgical intensive care unit in stable condition.  Postoperative hospital course  The patient was weaned from the ventilator using standard post cardiac surgical protocols without difficulty.  He has remained hemodynamically stable and has been weaned from nicardipine.  He does show some expected postoperative volume overload and is responding well to diuretics.  The patient did develop an episode of altered mental status and a code stroke was performed.  He has undergone an extensive neurological evaluation.  CT scan is initially negative for acute stroke or major blood vessel occlusion.  There is some concern about seizures but initial EEG was negative.  Other concerns include an EtOH/drug withdrawal as he was on Prozac, Xanax, trazodone and Neurontin preoperatively.  Neurology continues to follow closely.  On postop day #2 he remained hemodynamically stable with some hypertension and Cardene drip was continued to be weaned as tolerated.  His chest tubes were removed without difficulty.  CXR showed no evidence of pneumothorax.  He was hypertensive and his Lopressor dose was increased to 25 mg BID.  The patient's mental status improved without intervention and Neurology signed off.  He developed confusion and  agitation on the third night after surgery and required restraints briefly for safety. Precedex was resumed and his agitation improved. On the following day, his usual Xanax dosing was resumed and the Precedex was weaned down and off as his mental status and affect returned to baseline. He had urinary retention on post-op day 4 requiring in and out catheterization that yielded over 500 ml urine.  He had recurrent urinary retention later that evening so the foley catheter was replaced. Physical therapy was requested to assist with mobility and to assess for home health needs.

## 2021-03-15 NOTE — Procedures (Signed)
Routine EEG Report  [name] is a 65 y.o. male with a history of spells who is undergoing an EEG to evaluate for seizures.  Report: This EEG was acquired with electrodes placed according to the International 10-20 electrode system (including Fp1, Fp2, F3, F4, C3, C4, P3, P4, O1, O2, T3, T4, T5, T6, A1, A2, Fz, Cz, Pz). The following electrodes were missing or displaced: none.  The occipital dominant rhythm was 8.5 Hz. This activity is reactive to stimulation. Drowsiness was manifested by background fragmentation; deeper stages of sleep were not identified. There was no focal slowing. There were no interictal epileptiform discharges. There were no electrographic seizures identified. Photic stimulation and hyperventilation were not performed.   Impression: This EEG was obtained while awake and drowsy and is normal.    Clinical Correlation: Normal EEGs, however, do not rule out epilepsy.  Su Monks, MD [facility] [phone]  If 7pm- 7am, please page neurology on call as listed in [place].

## 2021-03-15 NOTE — Progress Notes (Addendum)
Patient was seen by Dr. Lorrin Goodell earlier this a.m., please see his note for full details.   On my exam, the patient has eyes closed, but on trying to open them he keeps them tightly closed with Bell's phenomena.  When I do hold them open and he stops trying to close them, they saccade about the room, though he actively avoids fixating.  When I use my like to check his pupillary reflexes, he avoids this.  He does blink to threat bilaterally.  When I lift on the arm and let it drop, it drops flaccidly to the bed, however if I hold his arm above his face he reliably avoids hitting his face allowing him to drop to the bed beside him.  His exam is strongly suggestive of functional nature to this decreased responsiveness.  Rapid EEG on spot checks has appeared negative.  I will perform a formal routine EEG, but I do not think based on this exam that prolonged EEG monitoring is likely to be helpful.  I stated out loud in the room that I expected him to improve, giving "permission to improve."  I do not take any other testing is necessarily indicated at this time, though if he does not improve, could consider MRI brain once possible.   Roland Rack, MD [facility] [phone]  If 7pm- 7am, please page neurology on call as listed in [place].

## 2021-03-15 NOTE — Progress Notes (Signed)
Patient assessed at 2215 and reported to RN that he did not feel good. Upon further assessment, patient unable to tell me his name and location. CBG 115 and VSS. Rapid response nurse notified and arrived to assess patient. Patient neurological status declining as he then became unable to answer questions. ABG obtained and results reported to on call surgeon. No recent narcotics given but narcan given with no change in neurological status. Code stroke initiated at 2300 per MD Bartle. Patient transported to and from CT with no issues. Ceribell placed and keppra given. Patient became hypertensive so cardene restarted. Other vital signs stable.

## 2021-03-15 NOTE — Progress Notes (Addendum)
2 Days Post-Op Procedure(s) (LRB): CORONARY ARTERY BYPASS GRAFTING (CABG), ON PUMP, TIMES THREE, USING LEFT INTERNAL MAMMARY ARTERY AND RIGHT ENDOSCOPICALLY HARVESTED GREATER SAPHENOUS VEIN (N/A) TRANSESOPHAGEAL ECHOCARDIOGRAM (TEE) (N/A) ENDOVEIN HARVEST OF GREATER SAPHENOUS VEIN (Right) Subjective:  Stable overnight with some improvement in mental status.  Objective: Vital signs in last 24 hours: Temp:  [97.9 F (36.6 C)-99.3 F (37.4 C)] 98.2 F (36.8 C) (10/02 0735) Pulse Rate:  [71-89] 72 (10/02 0745) Cardiac Rhythm: Normal sinus rhythm (10/02 0745) Resp:  [15-31] 19 (10/02 0745) BP: (97-173)/(57-118) 151/67 (10/02 0745) SpO2:  [83 %-100 %] 97 % (10/02 0745) Arterial Line BP: (107-111)/(55-57) 111/57 (10/01 1000) Weight:  [67 kg] 67 kg (10/02 0419)  Hemodynamic parameters for last 24 hours: CVP:  [11 mmHg] 11 mmHg  Intake/Output from previous day: 10/01 0701 - 10/02 0700 In: 2081.6 [P.O.:480; I.V.:757.4; IV Piggyback:844.3] Out: 3625 [Urine:3425; Chest Tube:200] Intake/Output this shift: No intake/output data recorded.  General appearance: lethargic but calm. No increased work of breathing. Neurologic: opens eyes and responds briefly to questions. Moves all extremities. Heart: regular rate and rhythm, S1, S2 normal, no murmur Lungs: clear to auscultation bilaterally Extremities: no edema Wound: incision ok  Lab Results: Recent Labs    03/14/21 1617 03/14/21 2246 03/14/21 2304 03/15/21 0345  WBC 12.6*  --   --  11.7*  HGB 12.9*   < > 12.6* 12.6*  HCT 37.6*   < > 37.0* 35.6*  PLT 118*  --   --  113*   < > = values in this interval not displayed.   BMET:  Recent Labs    03/14/21 1617 03/14/21 2246 03/14/21 2304 03/15/21 0345  NA 133*   < > 133* 134*  K 3.8   < > 3.2* 3.5  CL 99  --  100 103  CO2 25  --   --  23  GLUCOSE 144*  --  129* 110*  BUN 9  --  9 8  CREATININE 0.64  --  0.50* 0.56*  CALCIUM 8.4*  --   --  7.6*   < > = values in this interval  not displayed.    PT/INR:  Recent Labs    03/13/21 1821  LABPROT 16.4*  INR 1.3*   ABG    Component Value Date/Time   PHART 7.586 (H) 03/14/2021 2246   HCO3 23.1 03/14/2021 2246   TCO2 22 03/14/2021 2304   ACIDBASEDEF 5.0 (H) 03/14/2021 0102   O2SAT 92.0 03/14/2021 2246   CBG (last 3)  Recent Labs    03/14/21 2343 03/15/21 0342 03/15/21 0733  GLUCAP 123* 124* 123*   CXR: small left effusion and left basilar atelectasis.  Assessment/Plan: S/P Procedure(s) (LRB): CORONARY ARTERY BYPASS GRAFTING (CABG), ON PUMP, TIMES THREE, USING LEFT INTERNAL MAMMARY ARTERY AND RIGHT ENDOSCOPICALLY HARVESTED GREATER SAPHENOUS VEIN (N/A) TRANSESOPHAGEAL ECHOCARDIOGRAM (TEE) (N/A) ENDOVEIN HARVEST OF GREATER SAPHENOUS VEIN (Right)  POD 2  Hemodynamically stable with HTN on Cardene drip. Wean as tolerated.  Altered mental status last pm. Code stroke called and work up so far negative. He was non-focal but not responsive with stable hemodynamics, ABG ok. CT negative for acute stroke or major blood vessel occlusion. Concern about this being related to seizure or ETOH/drug withdrawal. He was on Prozac, xanax, trazodone and neurontin preop. EEG initially negative for seizure. Will continue to observe. Avoid sedation and pain meds. He was very shaky and restless from the time the he was coming out of anesthesia and had remained so  after extubation. Neurology following.   DC chest tubes       LOS: 4 days    Gaye Pollack 03/15/2021

## 2021-03-15 NOTE — Procedures (Signed)
Patient Name: [name]  MRN: [id number]  Epilepsy Attending: Lora Havens  Referring Physician/Provider: Dr Donnetta Simpers Duration: 03/14/2021 2344 to 03/15/2021 1136  Patient history: 65 y.o. male with PMH significant for anxiety, asthma, CAD, COPD, ischemic colitis, Hpylori infection, hemorrhoids, Hld, HTN, GERD, MI, restless leg syndrome who underwent CABG yesterday. He was alert and talking at 2100 on 03/14/21 at around 2215, reported that he did not feel good. He had an acute change in mentation and was noted to be not talking. Looks around, pupils reactive, corneals intact, clearly protecting his airway. but does not answer questions, does not follow commands, would not move his arms or legs. EEG to evaluate for seizure  Level of alertness: Awake  AEDs during EEG study: None  Technical aspects: This EEG was obtained using a 10 lead EEG system positioned circumferentially without any parasagittal coverage (rapid EEG). Computer selected EEG is reviewed as  well as background features and all clinically significant events.   Description: The posterior dominant rhythm consists of 8-9 Hz activity of moderate voltage (25-35 uV) seen predominantly in posterior head regions, symmetric and reactive to eye opening and eye closing. Hyperventilation and photic stimulation were not performed.     IMPRESSION: This limited ceribell EEG is within normal limits. No seizures or epileptiform discharges were seen throughout the recording.   If suspicion for ictal-interictal activity remains a concern, a traditional scalp eeg can be considered.   [name]

## 2021-03-15 NOTE — Progress Notes (Signed)
Patient ID: [name], male   DOB: [birth year], 65 y.o.   MRN: [id number] TCTS afternoon rounds:  Hemodynamically stable on cardene 5 mg/hr.   Sats 100%  UO good.  Neurology saw him today and he would not respond but by their exam was aware of their presence and probably intentionally not responding.  He is more alert this afternoon with family at bedside. They report that he does drink and smoke a lot although he denies it.  He says he is nervous.  Will resume his xanax at lower dose as well as Prozac and put on nicotine patch.

## 2021-03-15 NOTE — Consult Note (Addendum)
NEUROLOGY CONSULTATION NOTE   Date of service: March 15, 2021 Patient Name: [name] MRN:  [id number] DOB:  1955-07-09 Reason for consult: "Stroke code for poor responsiveness" Requesting Provider: Lajuana Matte, MD _ _ _   _ __   _ __ _ _  __ __   _ __   __ _  History of Present Illness  [name] is a 65 y.o. male with PMH significant for anxiety, asthma, CAD, COPD, ischemic colitis, Hpylori infection, hemorrhoids, Hld, HTN, GERD, MI, restless leg syndrome who underwent CABG yesterday. He was alert and talking at 2100 on 03/14/21 at around 2215, reported that he did not feel good. He had an acute change in mentation and was noted to be not talking. Vitals and glucose were stable and ABG with mild hypoxia. I was notified and a code stroke was activated. On my arrival, he would make eye contact with me, he was breathing spontaneously and looking around the room with eyes wide open. He would not talk, would not move his extremities on command or to noxious stimuli. He was protecting his airway with corneals intact and pupils equal round and reactive to light.  STAT CTH w/o contrast with no large stroke or ICH. CT angio with no eLVO.  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  mRS: 0 tNK/thrombectomy: recent CABG and MI excludes him from getting tNK, no LVO on CTA and thus not a candidate for intervention. LKW: 2100 on 03/14/21. NIHSS components Score: Comment  1a Level of Conscious 0[x]  1[]  2[]  3[]      1b LOC Questions 0[]  1[]  2[x]       1c LOC Commands 0[]  1[]  2[x]       2 Best Gaze 0[x]  1[]  2[]       3 Visual 0[x]  1[]  2[]  3[]      4 Facial Palsy 0[x]  1[]  2[]  3[]      5a Motor Arm - left 0[]  1[]  2[]  3[]  4[x]  UN[]    5b Motor Arm - Right 0[]  1[]  2[]  3[]  4[x]  UN[]    6a Motor Leg - Left 0[]  1[]  2[]  3[]  4[x]  UN[]    6b Motor Leg - Right 0[]  1[]  2[]  3[]  4[x]  UN[]    7 Limb Ataxia 0[x]  1[]  2[]  3[]  UN[]     8 Sensory 0[]  1[]  2[x]  UN[]      9 Best Language 0[]   1[]  2[]  3[x]      10 Dysarthria 0[]  1[]  2[x]  UN[]      11 Extinct. 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  Past History   Past Medical History:  Diagnosis Date  . Anxiety   . Anxiety and depression   . ASCVD (arteriosclerotic cardiovascular disease)    a. s/p PTCA to LCx in 1993 b. BMS to RCA in 01/2012 with residual 80% OM2 stenosis and medical management recommended c. 08/2019: cath showing patent stents with occluded OM2 --> medical management.   . Asthma   . CAD (coronary artery disease)   . Carcinoma in situ of colon 2004   rectal polyp  . Colitis, ischemic ([place]) 2011  . COPD (chronic obstructive pulmonary disease) (HCC)    mild ;excercise induced hypoxemia by cp stress test ;asthma ,bronchitis,  . Depression   . Diverticulosis   . Gastritis 12/30/10   EGD Dr [name]  . GERD (gastroesophageal reflux disease)   . GI bleed   .  H. pylori infection 2004   treated  . Hemorrhoids   . Hiatal hernia   . Hyperlipidemia   . Hypertension   . Inflammatory polyps of colon with rectal bleeding ([place])   . Leukocytosis    [name]  . Lung nodule 07/16/2011  . Myocardial infarction [facility])    age 39  . Restless leg syndrome   . Schatzki's ring   . Sleep apnea    does not use CPAP:cannot tolerate, PCP aware  . Syncope   . Tobacco abuse    50 pack years continuing at one halp pack daily  . Tubular adenoma of colon 06/2009   Colonosocpy Dr [name]   Past Surgical History:  Procedure Laterality Date  . BIOPSY  12/19/2017   Procedure: BIOPSY;  Surgeon: [name], MD;  [facility];  Service: Endoscopy;;  gastric  . CARDIAC CATHETERIZATION    . CHOLECYSTECTOMY  2004  . COLONOSCOPY  06/2009   normal terminal ileum, segmental mild inflammation of sigmoid colon (bx unremarkable), polyp, tubular adenoma  . COLONOSCOPY N/A 07/31/2013   Dr.Rourk- redundant anal canal hemorrhoids, colonic diverticulosis,  tubular adenoma  . COLONOSCOPY W/ POLYPECTOMY  2004   rectal polyp with carcinoma in situ removed via colonoscopy  . COLONOSCOPY WITH PROPOFOL N/A 09/15/2015   Dr. [name]: Scattered diverticula throughout the colon, 2 sessile polyps found in the descending colon and cecum, 5 mm in size.  Cecal polyp was sessile serrated polyp, descending colon polyp was a tubular adenoma.  He had a abnormal perianal exam along with grade 3 hemorrhoids.  Surveillance exam recommended for 5-year follow-up.  . COLONOSCOPY WITH PROPOFOL N/A 12/19/2017   one 5 mm polyp in ascending colon and 1 cm sessile polyp in ascending s/p removal. Tubular adenoma. internal hemorrhoids  . CORONARY ANGIOPLASTY WITH STENT PLACEMENT  01/26/2012   "1; total is now 2"  . CORONARY/GRAFT ACUTE MI REVASCULARIZATION N/A 03/10/2021   Procedure: Coronary/Graft Acute MI Revascularization;  Surgeon: Belva Crome, MD;  Location: [place] CV LAB;  Service: Cardiovascular;  Laterality: N/A;  . ESOPHAGOGASTRODUODENOSCOPY  02/2009   query Barrett's but bx negative  . ESOPHAGOGASTRODUODENOSCOPY  12/30/2010   [name],gastritis, dilated 17F, sm HH, 1 small ulcer, Duodenal erosions, benign bx  . ESOPHAGOGASTRODUODENOSCOPY (EGD) WITH ESOPHAGEAL DILATION N/A 09/12/2012   TJQ:[id number] Schatzki's ring s/p Maloney dilator. Small hiatal hernia. negative path  . ESOPHAGOGASTRODUODENOSCOPY (EGD) WITH ESOPHAGEAL DILATION N/A 07/31/2013   Dr. [name]- normal egd, s/p [facility] dilation empirically. Normal small bowel biopsies   . ESOPHAGOGASTRODUODENOSCOPY (EGD) WITH PROPOFOL N/A 09/15/2015   Dr. [name]: Medium sized hiatal hernia, normal-appearing esophagus status post empiric dilation  . ESOPHAGOGASTRODUODENOSCOPY (EGD) WITH PROPOFOL N/A 12/19/2017   erosive esophagitis s/p dilation, erosive gastropathy, normal duodenum  . ESOPHAGOGASTRODUODENOSCOPY (EGD) WITH PROPOFOL N/A 09/17/2019   Non-obstructing Schatzki's ring s/p dilation, medium-sized hiatal hernia,  otherwise normal.  . FLEXIBLE SIGMOIDOSCOPY  12/30/2010    [name],; internal hemorrhoids, anal papilla  . HAND SURGERY     surgical intervention for injury of the fingers of the left hand many years ago  . heart stent    . HEMORRHOID BANDING     Dr. [name]  . LEFT HEART CATH AND CORONARY ANGIOGRAPHY N/A 08/29/2019   Procedure: LEFT HEART CATH AND CORONARY ANGIOGRAPHY;  Surgeon: [name], MD;  Location: [place] [facility];  Service: Cardiovascular;  Laterality: N/A;  . LEFT HEART CATH AND CORONARY ANGIOGRAPHY N/A 03/10/2021  Procedure: LEFT HEART CATH AND CORONARY ANGIOGRAPHY;  Surgeon: Belva Crome, MD;  Location: [place] [facility];  Service: Cardiovascular;  Laterality: N/A;  . LEFT HEART CATHETERIZATION WITH CORONARY ANGIOGRAM N/A 01/26/2012   Procedure: LEFT HEART CATHETERIZATION WITH CORONARY ANGIOGRAM;  Surgeon: [name], MD;  Location: [facility] CATH LAB;  Service: Cardiovascular;  Laterality: N/A;  . [name] DILATION N/A 09/15/2015   Procedure: [name] DILATION;  Surgeon: [name], MD;  [facility];  Service: Endoscopy;  Laterality: N/A;  . [name] DILATION N/A 12/19/2017   Procedure: [name] DILATION;  Surgeon: [name], MD;  [facility];  Service: Endoscopy;  Laterality: N/A;  . [name] DILATION N/A 09/17/2019   Procedure: [name] DILATION;  Surgeon: [name], MD;  [facility];  Service: Endoscopy;  Laterality: N/A;  . NASAL SEPTOPLASTY W/ TURBINOPLASTY  10/06/2011   Procedure: NASAL SEPTOPLASTY WITH TURBINATE REDUCTION;  Surgeon: Izora Gala, MD;  Location: [place];  Service: ENT;  Laterality: Bilateral;  . PERCUTANEOUS CORONARY STENT INTERVENTION (PCI-S) N/A 01/26/2012   Procedure: PERCUTANEOUS CORONARY STENT INTERVENTION (PCI-S);  Surgeon: [name], MD;  Location: [facility] CATH LAB;  Service: Cardiovascular;  Laterality: N/A;  . POLYPECTOMY  09/15/2015   Procedure: POLYPECTOMY;  Surgeon: [name], MD;  [facility];  Service: Endoscopy;;  Cecal polyp removed via cold snare/ Descending colon polyp removed via cold snare  . POLYPECTOMY  12/19/2017   Procedure: POLYPECTOMY;  Surgeon: [name], MD;  [facility];  Service: Endoscopy;;  colon   Family History  Problem Relation Age of Onset  . Lung disease Father        deceased, black lung  . Heart disease Mother        blood clots  . Depression Mother   . Cancer Paternal Uncle        unknown type  . Cancer Maternal Aunt        unknown type  . Kidney failure Maternal Uncle   . Hypertension Brother   . Colon cancer Neg Hx   . ADD / ADHD Neg Hx   . Alcohol abuse Neg Hx   . Drug abuse Neg Hx   . Anxiety disorder Neg Hx   . Bipolar disorder Neg Hx   . Dementia Neg Hx   . OCD Neg Hx   . Paranoid behavior Neg Hx   . Schizophrenia Neg Hx   . Physical abuse Neg Hx   . Sexual abuse Neg Hx   . Seizures Neg Hx    Social History   Socioeconomic History  . Marital status: Married    Spouse name: Not on file  . Number of children: 3  . Years of education: Not on file  . Highest education level: Not on file  Occupational History  . Occupation: disable    Employer: RETIRED    Comment: [employer]  Tobacco Use  . Smoking status: Every Day    Packs/day: 1.00    Years: 48.00    Pack years: 48.00    Types: Cigarettes    Start date: 07/31/1969  . Smokeless tobacco: Former  Media pl[employer]  . Vaping Use: Never used  Substance and Sexual Activity  . Alcohol use: Yes    Comment: Drinks a beer occasionally  . Drug use: No  . Sexual activity: Not Currently  Other Topics Concern  . Not on file  Social History Narrative   3 stepchildren  Social Determinants of Health   Financial Resource Strain: Not on file  Food Insecurity: Not on file  Transportation Needs: Not on file  Physical Activity: Not on file  Stress: Not on file  Social Connections: Not on file   No Known Allergies  Medications   Medications Prior to Admission   Medication Sig Dispense Refill Last Dose  . albuterol (VENTOLIN HFA) 108 (90 Base) MCG/ACT inhaler Inhale 2 puffs into the lungs every 4 (four) hours as needed for wheezing or shortness of breath. 18 g 1 03/11/2021  . ALPRAZolam (XANAX) 1 MG tablet TAKE 1 TABLET BY MOUTH THREE TIMES A DAY AS NEEDED FOR ANXIETY (Patient taking differently: Take 1 mg by mouth 3 (three) times daily as needed for anxiety.) 90 tablet 1 Past Week  . amLODipine (NORVASC) 5 MG tablet Take 5 mg by mouth daily.   Past Week  . aspirin EC 81 MG tablet Take 81 mg by mouth daily.   03/10/2021  . atorvastatin (LIPITOR) 80 MG tablet TAKE 1 TABLET BY MOUTH EVERY DAY (Patient taking differently: Take 80 mg by mouth daily.) 90 tablet 3 Past Week  . budesonide-formoterol (SYMBICORT) 80-4.5 MCG/ACT inhaler Take 2 puffs first thing in am and then another 2 puffs about 12 hours later. (Patient taking differently: Inhale 2 puffs into the lungs in the morning and at bedtime.) 1 Inhaler 12 Past Week  . CREON 36000-114000 units CPEP capsule TAKE 2 CAPSULES BY MOUTH THREE TIMES DAILY WITH MEALS. 1 CAPSULE WITH SNACKS (Patient taking differently: Take 36,000-72,000 Units by mouth See admin instructions. 72,000 units (2 capsules) three times daily with meals and 36,000 units (1 capsule) with snacks) 240 capsule 3 03/10/2021  . cyanocobalamin 2000 MCG tablet Take 2,000 mcg by mouth daily.   03/10/2021  . dicyclomine (BENTYL) 10 MG capsule TAKE 1 CAPSULE (10 MG TOTAL) BY MOUTH 4 (FOUR) TIMES DAILY - BEFORE MEALS AND AT BEDTIME. (Patient taking differently: Take 10 mg by mouth 4 (four) times daily -  before meals and at bedtime.) 360 capsule 1 Past Week  . FLUoxetine (PROZAC) 20 MG capsule Take 1 capsule (20 mg total) by mouth daily. (Patient taking differently: Take 20 mg by mouth daily. Take with 40mg  for a total daily dose of 60mg ) 90 capsule 2 Past Week  . FLUoxetine (PROZAC) 40 MG capsule Take 1 capsule (40 mg total) by mouth daily. Take with 20 mg to  equal 60 mg daily (Patient taking differently: Take 40 mg by mouth daily. Take with 20mg  for a total daily dose of 60mg ) 90 capsule 2 Past Week  . fluticasone (FLONASE) 50 MCG/ACT nasal spray Place 2 sprays into both nostrils daily. (Patient taking differently: Place 2 sprays into both nostrils daily as needed for allergies.) 16 g 5 unk  . gabapentin (NEURONTIN) 300 MG capsule Take 2 capsules (600 mg total) by mouth 2 (two) times daily. 480 capsule 2 03/10/2021  . HYDROcodone-acetaminophen (NORCO) 7.5-325 MG tablet Take one tablet by mouth 2 (two) times daily as needed for moderate pain 60 tablet 0 03/10/2021  . isosorbide mononitrate (IMDUR) 30 MG 24 hr tablet Take 1 tablet (30 mg total) by mouth daily. 90 tablet 3 03/10/2021  . meloxicam (MOBIC) 15 MG tablet TAKE 1 TABLET BY MOUTH EVERY DAY (Patient taking differently: Take 15 mg by mouth daily.) 30 tablet 5 Past Week  . naloxone (NARCAN) nasal spray 4 mg/0.1 mL Use as directed (Patient taking differently: Place 0.4 mg into the nose as needed (  opiod reversal).) 1 each 0 unk  . nitroGLYCERIN (NITROSTAT) 0.4 MG SL tablet Place 1 tablet (0.4 mg total) under the tongue every 5 (five) minutes as needed for chest pain. 20 tablet 5 03/10/2021  . OLANZapine (ZYPREXA) 10 MG tablet Take 1 tablet (10 mg total) by mouth at bedtime. 90 tablet 2 Past Week  . pantoprazole (PROTONIX) 40 MG tablet TAKE 1 TABLET BY MOUTH TWICE A DAY BEFORE A MEAL (Patient taking differently: Take 40 mg by mouth 2 (two) times daily before a meal.) 180 tablet 3 Past Week  . rOPINIRole (REQUIP) 2 MG tablet TAKE 1 TABLET BY MOUTH EVERYDAY AT BEDTIME (Patient taking differently: Take 2 mg by mouth at bedtime.) 90 tablet 1 Past Week  . telmisartan (MICARDIS) 40 MG tablet Take 1 tablet (40 mg total) by mouth daily. 30 tablet 11 Past Week  . traZODone (DESYREL) 50 MG tablet TAKE 1 TABLET BY MOUTH EVERYDAY AT BEDTIME (Patient taking differently: Take 50 mg by mouth at bedtime.) 90 tablet 1 Past Week   . albuterol (PROVENTIL) (2.5 MG/3ML) 0.083% nebulizer solution Take 3 mLs (2.5 mg total) by nebulization every 6 (six) hours as needed for wheezing or shortness of breath. (Patient not taking: No sig reported) 75 mL 1 Not Taking     Vitals   Vitals:   03/15/21 0030 03/15/21 0045 03/15/21 0100 03/15/21 0115  BP: (!) 162/90 (!) 160/81 (!) 173/79   Pulse: 82 83 84 84  Resp: 20 17 (!) 23 18  Temp:      TempSrc:      SpO2: 98% 96% 97% 96%  Weight:      Height:         Body mass index is 21.45 kg/m.  Physical Exam   General: Laying comfortably in bed; in no acute distress.  HENT: Normal oropharynx and mucosa. Normal external appearance of ears and nose.  Neck: Supple, no pain or tenderness  CV: No JVD. No peripheral edema.  Pulmonary: Symmetric Chest rise. Normal respiratory effort.  Abdomen: Soft to touch, non-tender.  Ext: No cyanosis, edema, or deformity  Skin: No rash. Normal palpation of skin. Mid chest bandaged, Musculoskeletal: Normal digits and nails by inspection. No clubbing.   Neurologic Examination  Mental status/Cognition: awake, Alert, does not answer any orientation questions. Makes brief eye contact. Eyes are wide open. Speech/language: mute, does not answer questions or follow commands. Cranial nerves:   CN II Pupils equal and reactive to light, blinks to threat BL   CN III,IV,VI EOM intact to dolls eyes, no gaze preference or deviation, no nystagmus   CN V    CN VII no asymmetry, no nasolabial fold flattening   CN VIII    CN IX & X normal palatal elevation, no uvular deviation   CN XI 5/5 head turn and 5/5 shoulder shrug bilaterally   CN XII midline tongue protrusion   Motor/sensory:  Muscle bulk: poor, tone normal Unable to assess due to poor participation. Not noted to have any spontaneous movement, does not withdraw or localize to pain in any extremities.  Reflexes:  Right Left Comments  Pectoralis      Biceps (C5/6) 2+ 2+   Brachioradialis (C5/6)  2+ 2+    Triceps (C6/7) 2+ 2+    Patellar (L3/4) 2+ 2+    Achilles (S1) 2 2    Hoffman      Plantar mute mute   Jaw jerk    Coordination/Complex Motor:  Unable to assess due to  no movement in any of the extremities. - Gait: unsafe to assess given no movement in any of the extremities.  Labs   CBC:  Recent Labs  Lab 03/14/21 0353 03/14/21 1617 03/14/21 2246 03/14/21 2304  WBC 14.5* 12.6*  --   --   HGB 12.9* 12.9* 12.6* 12.6*  HCT 36.7* 37.6* 37.0* 37.0*  MCV 95.3 95.4  --   --   PLT 116* 118*  --   --     Basic Metabolic Panel:  Lab Results  Component Value Date   NA 133 (L) 03/14/2021   K 3.2 (L) 03/14/2021   CO2 25 03/14/2021   GLUCOSE 129 (H) 03/14/2021   BUN 9 03/14/2021   CREATININE 0.50 (L) 03/14/2021   CALCIUM 8.4 (L) 03/14/2021   GFRNONAA >60 03/14/2021   GFRAA 106 05/22/2020   Lipid Panel:  Lab Results  Component Value Date   LDLCALC 27 03/11/2021   HgbA1c:  Lab Results  Component Value Date   HGBA1C 4.9 03/11/2021   Urine Drug Screen: No results found for: LABOPIA, COCAINSCRNUR, LABBENZ, AMPHETMU, THCU, LABBARB  Alcohol Level No results found for: [place]  CT Head without contrast: Personally reviewed and CTH was negative for a large hypodensity concerning for a large territory infarct or hyperdensity concerning for an ICH  CT angio Head and Neck with contrast: Personally reviewed and no emergent LVO.  MRI Brain: Pending  Ceribell EEG: pending Impression   [name] is a 65 y.o. male with PMH significant for anxiety, asthma, CAD, COPD, ischemic colitis, Hpylori infection, hemorrhoids, Hld, HTN, GERD, MI, restless leg syndrome who underwent CABG yesterday. He was alert and talking at 2100 on 03/14/21 at around 2215, reported that he did not feel good. He had an acute change in mentation and was noted to be not talking. Looks around, pupils reactive, corneals intact, clearly protecting his airway. but does not answer questions, does not  follow commands, would not move his arms or legs.  I do not have an obvious explanation for his symptoms. His vitals, glucose, chemistry is non revealing for a cause. No change in Potassium to suspect potential periodic paralysis, unlikely for AIDP to be so rapid specially with preserved reflexes, unlikely to be CPM, as I do not see any obvious change in sodium.  I will get him up on Ceribell EEG. Will need clearance from [facility] team for MRI.  Recommendations  - Ceribell cEEG: No obvious seizures so far on my read. - MRI Brain w + w/o contrast if cleared by Cardiothoracic surgery team. ______________________________________________________________________  This patient is critically ill and at significant risk of neurological worsening, death and care requires constant monitoring of vital signs, hemodynamics,respiratory and cardiac monitoring, neurological assessment, discussion with family, other specialists and medical decision making of high complexity. I spent 50 minutes of neurocritical care time  in the care of  this patient. This was time spent independent of any time provided by nurse practitioner or PA.  Donnetta Simpers [facility] Pager Number [phone] 03/15/2021  3:43 AM   Thank you for the opportunity to take part in the care of this patient. If you have any further questions, please contact the neurology consultation attending.  Signed,  [place] Pager Number [phone] _ _ _   _ __   _ __ _ _  __ __   _ __   __ _

## 2021-03-15 NOTE — Progress Notes (Signed)
EEG complete - results pending 

## 2021-03-15 NOTE — Plan of Care (Signed)
  Problem: Cardiovascular: Goal: Ability to achieve and maintain adequate cardiovascular perfusion will improve Outcome: Progressing Goal: Vascular access site(s) Level 0-1 will be maintained Outcome: Progressing   Problem: Health Behavior/Discharge Planning: Goal: Ability to safely manage health-related needs after discharge will improve Outcome: Progressing

## 2021-03-16 ENCOUNTER — Encounter (HOSPITAL_COMMUNITY): Payer: Self-pay | Admitting: Thoracic Surgery (Cardiothoracic Vascular Surgery)

## 2021-03-16 ENCOUNTER — Inpatient Hospital Stay (HOSPITAL_COMMUNITY): Payer: Medicare PPO

## 2021-03-16 DIAGNOSIS — G9340 Encephalopathy, unspecified: Secondary | ICD-10-CM

## 2021-03-16 LAB — BASIC METABOLIC PANEL
Anion gap: 11 (ref 5–15)
BUN: 10 mg/dL (ref 8–23)
CO2: 25 mmol/L (ref 22–32)
Calcium: 8.5 mg/dL — ABNORMAL LOW (ref 8.9–10.3)
Chloride: 98 mmol/L (ref 98–111)
Creatinine, Ser: 0.64 mg/dL (ref 0.61–1.24)
GFR, Estimated: >60 mL/min (ref >60)
Glucose, Bld: 125 mg/dL — ABNORMAL HIGH (ref 70–99)
Potassium: 3.3 mmol/L — ABNORMAL LOW (ref 3.5–5.1)
Sodium: 134 mmol/L — ABNORMAL LOW (ref 135–145)

## 2021-03-16 LAB — GLUCOSE, CAPILLARY
Glucose-Capillary: 113 mg/dL — ABNORMAL HIGH (ref 70–99)
Glucose-Capillary: 117 mg/dL — ABNORMAL HIGH (ref 70–99)
Glucose-Capillary: 122 mg/dL — ABNORMAL HIGH (ref 70–99)
Glucose-Capillary: 120 mg/dL — ABNORMAL HIGH (ref 70–99)
Glucose-Capillary: 109 mg/dL — ABNORMAL HIGH (ref 70–99)

## 2021-03-16 LAB — CBC
HCT: 38.1 % — ABNORMAL LOW (ref 39.0–52.0)
Hemoglobin: 13.6 g/dL (ref 13.0–17.0)
MCH: 32.9 pg (ref 26.0–34.0)
MCHC: 35.7 g/dL (ref 30.0–36.0)
MCV: 92 fL (ref 80.0–100.0)
Platelets: 164 10*3/uL (ref 150–400)
RBC: 4.14 MIL/uL — ABNORMAL LOW (ref 4.22–5.81)
RDW: 12.4 % (ref 11.5–15.5)
WBC: 12.4 10*3/uL — ABNORMAL HIGH (ref 4.0–10.5)
nRBC: 0 % (ref 0.0–0.2)

## 2021-03-16 LAB — VITAMIN B12: Vitamin B-12: 1069 pg/mL — ABNORMAL HIGH (ref 180–914)

## 2021-03-16 MED ORDER — POTASSIUM CHLORIDE 20 MEQ PO PACK: 20.0000 meq | PACK | ORAL | Status: DC

## 2021-03-16 MED ORDER — POTASSIUM CHLORIDE CRYS ER 20 MEQ PO TBCR
40.0000 meq | EXTENDED_RELEASE_TABLET | Freq: Every day | ORAL | Status: DC
  Administered 2021-03-16 – 2021-03-19 (×4): 40 meq via ORAL
  Filled 2021-03-16 (×4): qty 2

## 2021-03-16 MED ORDER — CYANOCOBALAMIN 1000 MCG/ML IJ SOLN
1000.0000 ug | Freq: Once | INTRAMUSCULAR | Status: AC
  Administered 2021-03-17: 1000 ug via INTRAMUSCULAR
  Filled 2021-03-16: qty 1

## 2021-03-16 MED ORDER — DEXMEDETOMIDINE HCL IN NACL 400 MCG/100ML IV SOLN
0.4000 ug/kg/h | INTRAVENOUS | Status: DC
  Administered 2021-03-16: 0.6 ug/kg/h via INTRAVENOUS
  Administered 2021-03-17: 0.9 ug/kg/h via INTRAVENOUS
  Filled 2021-03-16: qty 200

## 2021-03-16 MED ORDER — SODIUM CHLORIDE 0.9% FLUSH
3.0000 mL | Freq: Two times a day (BID) | INTRAVENOUS | Status: DC
  Administered 2021-03-16 – 2021-03-17 (×4): 3 mL via INTRAVENOUS

## 2021-03-16 MED ORDER — ~~LOC~~ CARDIAC SURGERY, PATIENT & FAMILY EDUCATION: Freq: Once | Status: AC

## 2021-03-16 MED ORDER — SODIUM CHLORIDE 0.9% FLUSH
3.0000 mL | INTRAVENOUS | Status: DC | PRN
  Administered 2021-03-17: 3 mL via INTRAVENOUS

## 2021-03-16 MED ORDER — SODIUM CHLORIDE 0.9 % IV SOLN: 250.0000 mL | INTRAVENOUS | Status: DC | PRN

## 2021-03-16 MED ORDER — HYDRALAZINE HCL 20 MG/ML IJ SOLN
10.0000 mg | Freq: Once | INTRAMUSCULAR | Status: AC
  Administered 2021-03-16: 10 mg via INTRAVENOUS

## 2021-03-16 MED ORDER — METOPROLOL TARTRATE 25 MG PO TABS
25.0000 mg | ORAL_TABLET | Freq: Two times a day (BID) | ORAL | Status: DC
  Administered 2021-03-16 – 2021-03-19 (×6): 25 mg via ORAL
  Filled 2021-03-16 (×7): qty 1

## 2021-03-16 MED ORDER — POTASSIUM CHLORIDE CRYS ER 20 MEQ PO TBCR
20.0000 meq | EXTENDED_RELEASE_TABLET | ORAL | Status: DC
  Administered 2021-03-16 (×2): 20 meq via ORAL
  Filled 2021-03-16 (×2): qty 1

## 2021-03-16 MED ORDER — VITAMIN B-12 1000 MCG PO TABS
1000.0000 ug | ORAL_TABLET | Freq: Every day | ORAL | Status: DC
  Administered 2021-03-18 – 2021-03-19 (×2): 1000 ug via ORAL
  Filled 2021-03-16 (×2): qty 1

## 2021-03-16 MED ORDER — LORAZEPAM 2 MG/ML IJ SOLN: 1.0000 mg | Freq: Once | INTRAMUSCULAR | Status: AC

## 2021-03-16 MED ORDER — CLOPIDOGREL BISULFATE 75 MG PO TABS
75.0000 mg | ORAL_TABLET | Freq: Every day | ORAL | Status: DC
  Administered 2021-03-17 – 2021-03-19 (×3): 75 mg via ORAL
  Filled 2021-03-16 (×3): qty 1

## 2021-03-16 MED ORDER — FUROSEMIDE 10 MG/ML IJ SOLN
40.0000 mg | Freq: Once | INTRAMUSCULAR | Status: AC
  Administered 2021-03-16: 40 mg via INTRAVENOUS
  Filled 2021-03-16: qty 4

## 2021-03-16 MED ORDER — LORAZEPAM 2 MG/ML IJ SOLN
INTRAMUSCULAR | Status: AC
  Administered 2021-03-16: 1 mg via INTRAVENOUS
  Filled 2021-03-16: qty 1

## 2021-03-16 MED ORDER — GUAIFENESIN ER 600 MG PO TB12
1200.0000 mg | ORAL_TABLET | Freq: Two times a day (BID) | ORAL | Status: DC
  Administered 2021-03-16 – 2021-03-19 (×7): 1200 mg via ORAL
  Filled 2021-03-16 (×7): qty 2

## 2021-03-16 MED ORDER — HYDRALAZINE HCL 20 MG/ML IJ SOLN
INTRAMUSCULAR | Status: AC
  Filled 2021-03-16: qty 1

## 2021-03-16 MED FILL — Heparin Sodium (Porcine) Inj 1000 Unit/ML: Qty: 1000 | Status: AC

## 2021-03-16 MED FILL — Cefazolin Sodium-Dextrose IV Solution 2 GM/100ML-4%: INTRAVENOUS | Qty: 100 | Status: AC

## 2021-03-16 MED FILL — Potassium Chloride Inj 2 mEq/ML: INTRAVENOUS | Qty: 40 | Status: AC

## 2021-03-16 MED FILL — Lidocaine HCl Local Preservative Free (PF) Inj 2%: INTRAMUSCULAR | Qty: 15 | Status: AC

## 2021-03-16 NOTE — Plan of Care (Signed)
  Problem: Education: Goal: Understanding of CV disease, CV risk reduction, and recovery process will improve Outcome: Progressing Goal: Individualized Educational Video(s) Outcome: Progressing   Problem: Activity: Goal: Ability to return to baseline activity level will improve Outcome: Progressing   Problem: Cardiovascular: Goal: Ability to achieve and maintain adequate cardiovascular perfusion will improve Outcome: Progressing Goal: Vascular access site(s) Level 0-1 will be maintained Outcome: Progressing   Problem: Health Behavior/Discharge Planning: Goal: Ability to safely manage health-related needs after discharge will improve Outcome: Progressing   Problem: Education: Goal: Will demonstrate proper wound care and an understanding of methods to prevent future damage Outcome: Progressing Goal: Knowledge of disease or condition will improve Outcome: Progressing Goal: Knowledge of the prescribed therapeutic regimen will improve Outcome: Progressing Goal: Individualized Educational Video(s) Outcome: Progressing   Problem: Activity: Goal: Risk for activity intolerance will decrease Outcome: Progressing   Problem: Cardiac: Goal: Will achieve and/or maintain hemodynamic stability Outcome: Progressing   Problem: Clinical Measurements: Goal: Postoperative complications will be avoided or minimized Outcome: Progressing   Problem: Respiratory: Goal: Respiratory status will improve Outcome: Progressing   Problem: Skin Integrity: Goal: Wound healing without signs and symptoms of infection Outcome: Progressing Goal: Risk for impaired skin integrity will decrease Outcome: Progressing   Problem: Urinary Elimination: Goal: Ability to achieve and maintain adequate renal perfusion and functioning will improve Outcome: Progressing   Problem: Safety: Goal: Non-violent Restraint(s) Outcome: Progressing   Problem: Education: Goal: Knowledge of General Education information  will improve Description: Including pain rating scale, medication(s)/side effects and non-pharmacologic comfort measures Outcome: Progressing   Problem: Clinical Measurements: Goal: Will remain free from infection Outcome: Progressing Goal: Diagnostic test results will improve Outcome: Progressing Goal: Cardiovascular complication will be avoided Outcome: Progressing   Problem: Activity: Goal: Risk for activity intolerance will decrease Outcome: Progressing   Problem: Nutrition: Goal: Adequate nutrition will be maintained Outcome: Progressing   Problem: Elimination: Goal: Will not experience complications related to bowel motility Outcome: Progressing Goal: Will not experience complications related to urinary retention Outcome: Progressing   Problem: Pain Managment: Goal: General experience of comfort will improve Outcome: Progressing   Problem: Safety: Goal: Ability to remain free from injury will improve Outcome: Progressing   Problem: Skin Integrity: Goal: Risk for impaired skin integrity will decrease Outcome: Progressing

## 2021-03-16 NOTE — Progress Notes (Addendum)
Neurology Progress Note  Brief HPI: 65 y.o. male with PMHx of anxiety, asthma, CAD, COPD, ischemic colitis, H. Pylori, HLD, HTN, GERD, MU, RLS who underwent CABD 10/1 with acute change in mentation 10/1 at around 22:15 with code stroke activation. CTH without acute infarct or ICH, CT angio imaging was negative for LVO. Rapid EEG negative for seizures.   Subjective: No acute overnight events noted. Improvement in alertness noted yesterday afternoon with family at bedside.  Bedside RN reports some confusion overnight but with significant improvement from initial evaluation.   Exam: Vitals:   03/16/21 0500 03/16/21 0650  BP: (!) 151/95   Pulse: 81   Resp:    Temp:  98.2 F (36.8 C)  SpO2: 97%    Gen: On assessment, patient is being assisted to ambulate to the restroom with a 2-person assist. He is not in any acute distress Resp: non-labored breathing, no respiratory distress on room air Abd: soft to palpation throughout, non-tender  Neuro: Mental Status: Awake, alert to self, situation, month, year, and place. He states that the month is September but when told that it is currently October he states "I lost a day yesterday".  He is able to provide some details regarding his history of present illness but is unable to recall events following CABG procedure. Naming, repetition, fluency, and comprehension are intact.  Speech is intact without dysarthria. There are no signs of aphasia or neglect noted. Cranial Nerves: PERRL 4 mm / brisk, EOMI, visual fields full, facial sensation is intact and symmetric to light touch, face is symmetric resting and smiling, hearing is intact to voice, shoulders shrug symmetrically, phonation normal, palate rises symmetrically, tongue protrudes midline.  Motor: Moves all extremities well without asymmetry.  5/5 strength in bilateral upper and lower extremities without vertical drift.  Tone and bulk are normal.  Patient has a mild intermittent tremor of BUE  that increases with intention.  Sensory: Intact and symmetric to light touch in bilateral upper and lower extremities. DTR: 1+ and symmetric patellae, 2+ and symmetric biceps and brachioradialis.  Gait: Deferred  Pertinent Labs: CBC    Component Value Date/Time   WBC 12.4 (H) 03/16/2021 0542   RBC 4.14 (L) 03/16/2021 0542   HGB 13.6 03/16/2021 0542   HGB 14.8 12/13/2017 1603   HCT 38.1 (L) 03/16/2021 0542   HCT 41.8 12/13/2017 1603   PLT 164 03/16/2021 0542   PLT 186 12/13/2017 1603   MCV 92.0 03/16/2021 0542   MCV 92 12/13/2017 1603   MCH 32.9 03/16/2021 0542   MCHC 35.7 03/16/2021 0542   RDW 12.4 03/16/2021 0542   RDW 13.9 12/13/2017 1603   LYMPHSABS 2,891 03/09/2019 0950   LYMPHSABS 2.4 12/13/2017 1603   MONOABS 0.4 12/16/2017 1310   EOSABS 59 03/09/2019 0950   EOSABS 0.1 12/13/2017 1603   BASOSABS 47 03/09/2019 0950   BASOSABS 0.0 12/13/2017 1603   CMP     Component Value Date/Time   NA 134 (L) 03/16/2021 0542   NA 141 05/22/2020 0951   K 3.3 (L) 03/16/2021 0542   CL 98 03/16/2021 0542   CO2 25 03/16/2021 0542   GLUCOSE 125 (H) 03/16/2021 0542   BUN 10 03/16/2021 0542   BUN 16 05/22/2020 0951   CREATININE 0.64 03/16/2021 0542   CREATININE 0.72 06/20/2014 0748   CALCIUM 8.5 (L) 03/16/2021 0542   PROT 5.4 (L) 03/11/2021 0109   PROT 6.5 12/13/2017 1603   ALBUMIN 3.2 (L) 03/11/2021 0109   ALBUMIN 4.3 12/13/2017  1603   AST 57 (H) 03/11/2021 0109   ALT 40 03/11/2021 0109   ALKPHOS 84 03/11/2021 0109   BILITOT 0.5 03/11/2021 0109   BILITOT 0.7 12/13/2017 1603   GFRNONAA >60 03/16/2021 0542   GFRAA 106 05/22/2020 0951   Lab Results  Component Value Date   CHOL 85 03/11/2021   HDL 42 03/11/2021   LDLCALC 27 03/11/2021   LDLDIRECT 51.0 08/29/2019   TRIG 82 03/11/2021   CHOLHDL 2.0 03/11/2021   Ammonia 03/15/2021: <10  Lab Results  Component Value Date   VITAMINB12 141 (L) 12/18/2020   Imaging Reviewed:  Rapid [place] EEG 03/14/2021: "This limited  ceribell EEG is within normal limits. No seizures or epileptiform discharges were seen throughout the recording. If suspicion for ictal-interictal activity remains a concern, a traditional scalp eeg can be considered."  Routine EEG 03/15/2021: "Impression: This EEG was obtained while awake and drowsy and is normal."  CT Head code stroke wo contrast 03/14/2021: 1. Negative head CT.  No acute intracranial abnormality. 2. ASPECTS is 10.  CT angio head and neck 03/14/2021: 1. Negative CTA for emergent large vessel occlusion. 2. Mild-to-moderate atherosclerotic change about the major arterial vasculature of the head and neck as above, most notable about the V4 segments where there are moderate short-segment stenoses. 3. Sequelae of recent CABG with small layering left pleural effusion. 4. Emphysema (ICD10-J43.9). 5. 5 mm left upper lobe nodule, indeterminate. No follow-up needed if patient is low-risk. Non-contrast chest CT can be considered in 12 months if patient is high-risk. This recommendation follows the consensus statement: Guidelines for Management of Incidental Pulmonary Nodules Detected on CT Images: From the Fleischner Society 2017; Radiology 2017; 284:228-243.  Assessment: 65 y.o. male who underwent CABG 10/1 with acute mental status change the evening of 10/1. Initial imaging and EEG without acute abnormalities. Presentation was felt to be largely functional appearing.  - Examination much improved 10/3 without residual confusion or focal neurologic deficits. Patient states that he does not recall events following his CABG but feels that he is at his baseline mental status at this time. Per bedside RNs, family at bedside overnight reports that patient has had recent and multiple traumatic events that may be contributing to his presentation and anxiety.  - Due to clearing of mental status and previous examination being strongly suspicious for a functional presentation, will defer further  testing at this time. Can consider MRI brain if patient's mental status declines.   Recommendations: - Will defer MRI brain imaging at this time as above - Vitamin B12 level and MMA pending  - Supplement B12- previously low in July 2022. Will give IM dose x 1 and add PO dosing daily for a B12 goal of > 400. Can discontinue supplementation if level is at goal of > 400.  - No further neurology recommendations at this time. Neurology will be available on an as needed basis for further questions or concerns.  - Plan discussed with attending MD, Dr. Curly Shores, who is in agreement.   Anibal Henderson, AGACNP-BC [facility] [phone]

## 2021-03-16 NOTE — Progress Notes (Signed)
[address]       [place] [address]             [phone]      3 Days Post-Op  Procedure(s) (LRB): CORONARY ARTERY BYPASS GRAFTING (CABG), ON PUMP, TIMES THREE, USING LEFT INTERNAL MAMMARY ARTERY AND RIGHT ENDOSCOPICALLY HARVESTED GREATER SAPHENOUS VEIN (N/A) TRANSESOPHAGEAL ECHOCARDIOGRAM (TEE) (N/A) ENDOVEIN HARVEST OF GREATER SAPHENOUS VEIN (Right)   Total Length of Stay:  LOS: 5 days    SUBJECTIVE:  Vitals:   03/16/21 1400 03/16/21 1514  BP: (!) 139/93   Pulse: 83   Resp:    Temp:  98.1 F (36.7 C)  SpO2: 93%     Intake/Output      10/02 0701 10/03 0700 10/03 0701 10/04 0700   P.O. 460    I.V. (mL/kg) 298.3 (4.6) 20 (0.3)   IV Piggyback 109.9    Total Intake(mL/kg) 868.2 (13.3) 20 (0.3)   Urine (mL/kg/hr) 1650 (1.1) 1000 (1.8)   Stool 0    Chest Tube     Total Output 1650 1000   Net -781.8 -980        Stool Occurrence 0 x        sodium chloride Stopped (03/14/21 0023)   sodium chloride Stopped (03/14/21 1231)   sodium chloride     lactated ringers Stopped (03/13/21 2018)   lactated ringers 20 mL/hr at 03/14/21 1100    CBC    Component Value Date/Time   WBC 12.4 (H) 03/16/2021 0542   RBC 4.14 (L) 03/16/2021 0542   HGB 13.6 03/16/2021 0542   HGB 14.8 12/13/2017 1603   HCT 38.1 (L) 03/16/2021 0542   HCT 41.8 12/13/2017 1603   PLT 164 03/16/2021 0542   PLT 186 12/13/2017 1603   MCV 92.0 03/16/2021 0542   MCV 92 12/13/2017 1603   MCH 32.9 03/16/2021 0542   MCHC 35.7 03/16/2021 0542   RDW 12.4 03/16/2021 0542   RDW 13.9 12/13/2017 1603   LYMPHSABS 2,891 03/09/2019 0950   LYMPHSABS 2.4 12/13/2017 1603   MONOABS 0.4 12/16/2017 1310   EOSABS 59 03/09/2019 0950   EOSABS 0.1 12/13/2017 1603   BASOSABS 47 03/09/2019 0950   BASOSABS 0.0 12/13/2017 1603   CMP     Component Value Date/Time   NA 134 (L) 03/16/2021 0542   NA 141 05/22/2020 0951   K 3.3 (L) 03/16/2021 0542   CL 98 03/16/2021 0542   CO2 25 03/16/2021  0542   GLUCOSE 125 (H) 03/16/2021 0542   BUN 10 03/16/2021 0542   BUN 16 05/22/2020 0951   CREATININE 0.64 03/16/2021 0542   CREATININE 0.72 06/20/2014 0748   CALCIUM 8.5 (L) 03/16/2021 0542   PROT 5.4 (L) 03/11/2021 0109   PROT 6.5 12/13/2017 1603   ALBUMIN 3.2 (L) 03/11/2021 0109   ALBUMIN 4.3 12/13/2017 1603   AST 57 (H) 03/11/2021 0109   ALT 40 03/11/2021 0109   ALKPHOS 84 03/11/2021 0109   BILITOT 0.5 03/11/2021 0109   BILITOT 0.7 12/13/2017 1603   GFRNONAA >60 03/16/2021 0542   GFRAA 106 05/22/2020 0951   ABG    Component Value Date/Time   PHART 7.586 (H) 03/14/2021 2246   PCO2ART 24.2 (L) 03/14/2021 2246   PO2ART 51 (L) 03/14/2021 2246   HCO3 23.1 03/14/2021 2246   TCO2 22 03/14/2021 2304   ACIDBASEDEF 5.0 (H) 03/14/2021 0102   O2SAT 92.0 03/14/2021 2246   CBG (last 3)  Recent Labs  03/16/21 0649 03/16/21 1106 03/16/21 1512  GLUCAP 117* 122* 120*     ASSESSMENT:  Stable Day  No further confusion, Neuro has signed off  Weaning oxygen as tolerated  Ambulated with cardiac rehabilitation  Awaiting transfer to [place], PA-C 03/16/21

## 2021-03-16 NOTE — Progress Notes (Signed)
Aggitated, combative, states hes leaving right now. Grandson at bedside : Patient has taken off all care equipment . Fussing, cursing and carrying on . Unable to redirect. Contacted Dr lightfoot . Orders obtained for restraints, ativan 1mg  and precedex drip .

## 2021-03-16 NOTE — Progress Notes (Addendum)
CARDIAC REHAB PHASE I   PRE:  Rate/Rhythm: 91 SR with PACs    BP: sitting 145/95    SaO2: 94 2L  MODE:  Ambulation: 290 ft   POST:  Rate/Rhythm: 116 ST with PACs    BP: sitting 141/105     SaO2: 91 2L  Pt up in recliner, agreeable to ambulate. Able to scoot to EOB of recliner with verbal cues and stand with min assist. Used [place], 2L, gait belt assist. Slow pace, occasional unsteadiness due to scissoring feet or poor foot placement. At times pt less confident in steps and states his legs feel generally weak. He sts it feels good to be up moving. To bed after walk, SAO2 slow to register. Practicing IS, 1000 ml. Will f/u. [phone]   [place], ACSM 03/16/2021 10:10 AM

## 2021-03-16 NOTE — Progress Notes (Addendum)
[facility] DAILY ICU PROGRESS NOTE                   [address]            [place] [address]          [phone]   3 Days Post-Op Procedure(s) (LRB): CORONARY ARTERY BYPASS GRAFTING (CABG), ON PUMP, TIMES THREE, USING LEFT INTERNAL MAMMARY ARTERY AND RIGHT ENDOSCOPICALLY HARVESTED GREATER SAPHENOUS VEIN (N/A) TRANSESOPHAGEAL ECHOCARDIOGRAM (TEE) (N/A) ENDOVEIN HARVEST OF GREATER SAPHENOUS VEIN (Right)  Total Length of Stay:  LOS: 5 days   Subjective:  Up in chair, states not a good morning, however has no specific complaints.  His oriented to place, mild confusion to time.  States he has ambulated  + BM  Objective: Vital signs in last 24 hours: Temp:  [98.2 F (36.8 C)-98.6 F (37 C)] 98.2 F (36.8 C) (10/03 0650) Pulse Rate:  [71-90] 81 (10/03 0500) Cardiac Rhythm: Normal sinus rhythm (10/03 0400) Resp:  [16-24] 20 (10/02 1300) BP: (95-167)/(59-98) 151/95 (10/03 0500) SpO2:  [91 %-100 %] 97 % (10/03 0500) Weight:  [65.3 kg] 65.3 kg (10/03 0500)  Filed Weights   03/14/21 0500 03/15/21 0419 03/16/21 0500  Weight: 67.8 kg 67 kg 65.3 kg    Weight change: -1.7 kg   Intake/Output from previous day: 10/02 0701 - 10/03 0700 In: 868.2 [P.O.:460; I.V.:298.3; IV Piggyback:109.9] Out: 1650 [Urine:1650]  Current Meds: Scheduled Meds:  acetaminophen  1,000 mg Oral Q6H   Or   acetaminophen (TYLENOL) oral liquid 160 mg/5 mL  1,000 mg Per Tube Q6H   aspirin EC  325 mg Oral Daily   Or   aspirin  324 mg Per Tube Daily   atorvastatin  80 mg Oral Daily   bisacodyl  10 mg Oral Daily   Or   bisacodyl  10 mg Rectal Daily   Chlorhexidine Gluconate Cloth  6 each Topical Daily   Chlorhexidine Gluconate Cloth  6 each Topical Q0600   [START ON 03/17/2021] clopidogrel  75 mg Oral Daily   [facility], Patient [facility] Family Education   Does not apply Once   docusate sodium  200 mg Oral Daily   FLUoxetine  60 mg Oral Daily   furosemide  40 mg Intravenous Once    insulin aspart  0-24 Units Subcutaneous Q4H   metoprolol tartrate  25 mg Oral BID   mupirocin ointment  1 application Nasal BID   nicotine  21 mg Transdermal Daily   pantoprazole  40 mg Oral Daily   potassium chloride  20 mEq Oral Q4H   potassium chloride  40 mEq Oral Daily   sodium chloride flush  10-40 mL Intracatheter Q12H   sodium chloride flush  3 mL Intravenous Q12H   sodium chloride flush  3 mL Intravenous Q12H   Continuous Infusions:  sodium chloride Stopped (03/14/21 0023)   sodium chloride Stopped (03/14/21 1231)   sodium chloride Stopped (03/14/21 0817)   sodium chloride     lactated ringers Stopped (03/13/21 2018)   lactated ringers 20 mL/hr at 03/14/21 1100   PRN Meds:.sodium chloride, sodium chloride, albuterol, ALPRAZolam, metoprolol tartrate, morphine injection, ondansetron (ZOFRAN) IV, oxyCODONE, sodium chloride flush, sodium chloride flush, sodium chloride flush, traMADol  General appearance: cooperative and no distress Neurologic: intact Heart: regular rate and rhythm Lungs: clear to auscultation bilaterally Extremities: edema none present Wound: clean and dry  Lab Results: CBC: Recent Labs    03/15/21 0345 03/16/21 0542  WBC 11.7* 12.4*  HGB 12.6* 13.6  HCT 35.6* 38.1*  PLT 113* 164   BMET:  Recent Labs    03/15/21 0345 03/16/21 0542  NA 134* 134*  K 3.5 3.3*  CL 103 98  CO2 23 25  GLUCOSE 110* 125*  BUN 8 10  CREATININE 0.56* 0.64  CALCIUM 7.6* 8.5*    CMET: Lab Results  Component Value Date   WBC 12.4 (H) 03/16/2021   HGB 13.6 03/16/2021   HCT 38.1 (L) 03/16/2021   PLT 164 03/16/2021   GLUCOSE 125 (H) 03/16/2021   CHOL 85 03/11/2021   TRIG 82 03/11/2021   HDL 42 03/11/2021   LDLDIRECT 51.0 08/29/2019   LDLCALC 27 03/11/2021   ALT 40 03/11/2021   AST 57 (H) 03/11/2021   NA 134 (L) 03/16/2021   K 3.3 (L) 03/16/2021   CL 98 03/16/2021   CREATININE 0.64 03/16/2021   BUN 10 03/16/2021   CO2 25 03/16/2021   TSH 1.154 03/11/2021    PSA 0.76 06/20/2014   INR 1.3 (H) 03/13/2021   HGBA1C 4.9 03/11/2021   MICROALBUR 0.50 01/09/2013      PT/INR:  Recent Labs    03/13/21 1821  LABPROT 16.4*  INR 1.3*   Radiology: EEG adult  Result Date: 03/15/2021 Derek Jack, MD     03/15/2021  7:55 PM Routine EEG Report [name] is a 65 y.o. male with a history of spells who is undergoing an EEG to evaluate for seizures. Report: This EEG was acquired with electrodes placed according to the International 10-20 electrode system (including Fp1, Fp2, F3, F4, C3, C4, P3, P4, O1, O2, T3, T4, T5, T6, A1, A2, Fz, Cz, Pz). The following electrodes were missing or displaced: none. The occipital dominant rhythm was 8.5 Hz. This activity is reactive to stimulation. Drowsiness was manifested by background fragmentation; deeper stages of sleep were not identified. There was no focal slowing. There were no interictal epileptiform discharges. There were no electrographic seizures identified. Photic stimulation and hyperventilation were not performed. Impression: This EEG was obtained while awake and drowsy and is normal.   Clinical Correlation: Normal EEGs, however, do not rule out epilepsy. Su Monks, MD [facility] [phone] If 7pm- 7am, please page neurology on call as listed in [place].     Assessment/Plan: S/P Procedure(s) (LRB): CORONARY ARTERY BYPASS GRAFTING (CABG), ON PUMP, TIMES THREE, USING LEFT INTERNAL MAMMARY ARTERY AND RIGHT ENDOSCOPICALLY HARVESTED GREATER SAPHENOUS VEIN (N/A) TRANSESOPHAGEAL ECHOCARDIOGRAM (TEE) (N/A) ENDOVEIN HARVEST OF GREATER SAPHENOUS VEIN (Right)  CV- NSR, SBP elevated in the 150s-Lopressor dose was titrated to 25 mg BID Pulm- weaning oxygen as tolerated, CXR w/o pleural effusions, mild atelectasis, pulmonary edema present... EPW remove today Renal- creatinine is normal at 0.64, on IV Lasix Hypokalemic at 3.3, supplementation has been ordered Neuro- grossly intact, he does have some  mild confusion, code stroke called yesterday and workup was negative, Neurology is following CBGs controlled, not a diabetic, will stop SSIP Dispo- patient stable, titration of BB for additional BP control, continue IV diuretics with increased potassium supplementation, Neurology following for recent stroke workup, patient stable for transfer to the progressive care unit   Ellwood Handler, PA-C 03/16/2021 7:53 AM  Agree with above Mental status improvevd Will replace K Will remove wires Transfer to floor Plavix tomorrow  Lajuana Matte

## 2021-03-16 NOTE — Progress Notes (Addendum)
Hypertensive since 1900 ; readings filed. Discussed with Dr Kipp Brood, I have tried lopressor PRN does and po scheduled dose . Hypertension persist  orders to give hydralazine 10mg  iv now

## 2021-03-16 NOTE — Discharge Summary (Signed)
[name]       [name]             [phone]    Physician Discharge Summary  Patient ID: [name] MRN: [id number] DOB/AGE: 1955-11-11 65 y.o.  Admit date: 03/10/2021 Discharge date: 03/19/2021  Admission Diagnoses:  Patient Active Problem List   Diagnosis Date Noted   STEMI (ST elevation myocardial infarction) ([place]) 03/11/2021   ACS (acute coronary syndrome) ([place])    Depression, major, single episode, in partial remission ([place]) 10/06/2020   Coronary artery disease involving native coronary artery of native heart with angina pectoris ([place])    Hyperlipidemia LDL goal <70    Complete tear of left rotator cuff 07/19/2019   Grade II hemorrhoids 03/08/2019   Diarrhea 02/08/2019   Dysphagia    Hiatal hernia    History of colonic polyps    Diverticulosis of colon without hemorrhage    Gastroparesis 11/20/2013   Chronic pain syndrome 10/23/2013   Nausea alone 07/23/2013   Hyperglycemia 04/25/2013   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 anxiety 09/20/2012   Pain in limb 09/20/2012   Loose stools 09/07/2012   Dyspepsia 09/05/2012   Dysphagia, unspecified(787.20) 09/05/2012   Gastroesophageal reflux disease 05/09/2012   Lung nodule 07/16/2011   RLS (restless legs syndrome) 03/09/2011   Chronic diarrhea 12/21/2010   Leukocytosis 12/21/2010   Cigarette smoker 09/23/2009   ATHEROSCLEROTIC CARDIOVASCULAR DISEASE 09/23/2009   Hyperlipidemia 10/01/2008   Essential hypertension 10/01/2008   Depression with anxiety 03/31/2007   OBSTRUCTIVE SLEEP APNEA 03/31/2007   COPD GOLD 0/ still smoking  03/31/2007   Discharge Diagnoses:  Patient Active Problem List   Diagnosis Date Noted   Acute encephalopathy    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   STEMI (ST elevation myocardial infarction) ([place]) 03/11/2021   ACS (acute coronary syndrome) ([place])    Depression, major, single episode, in partial remission ([place]) 10/06/2020   Coronary artery disease involving  native coronary artery of native heart with angina pectoris ([place])    Hyperlipidemia LDL goal <70    Complete tear of left rotator cuff 07/19/2019   Grade II hemorrhoids 03/08/2019   Diarrhea 02/08/2019   Dysphagia    Hiatal hernia    History of colonic polyps    Diverticulosis of colon without hemorrhage    Gastroparesis 11/20/2013   Chronic pain syndrome 10/23/2013   Nausea alone 07/23/2013   Hyperglycemia 04/25/2013   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 anxiety 09/20/2012   Pain in limb 09/20/2012   Loose stools 09/07/2012   Dyspepsia 09/05/2012   Dysphagia, unspecified(787.20) 09/05/2012   Gastroesophageal reflux disease 05/09/2012   Lung nodule 07/16/2011   RLS (restless legs syndrome) 03/09/2011   Chronic diarrhea 12/21/2010   Leukocytosis 12/21/2010   Cigarette smoker 09/23/2009   ATHEROSCLEROTIC CARDIOVASCULAR DISEASE 09/23/2009   Hyperlipidemia 10/01/2008   Essential hypertension 10/01/2008   Depression with anxiety 03/31/2007   OBSTRUCTIVE SLEEP APNEA 03/31/2007   COPD GOLD 0/ still smoking  03/31/2007  -Post-op urinary retention   Discharged Condition: stable  History of Present Illness:    At time of surgical consultation. 65 yo male with Hx of CAD s/p PCI present with unstable angina.  He was ruled in for STEMI and taken to the cath lab where he was noted to have three-vessel coronary artery disease and in-stent restenosis.  He was not deemed a good candidate for repeat PCI.  CTS was consulted to assist with management.  He was  placed on an Aggrastat drip and has been comfortable.  Hospital course:  Patient was medically stabilized on the cardiology service and felt stable to proceed with surgery.  On 03/13/2021 the patient was taken the operating room at which time he underwent CABG x3 by Dr. Kipp Brood.  He tolerated the procedure well was taken to the surgical intensive care unit in stable condition.  Postoperative Hospital Course  The patient was weaned  from the ventilator using standard post cardiac surgical protocols without difficulty.  He has remained hemodynamically stable and has been weaned from nicardipine.  He does show some expected postoperative volume overload and is responding well to diuretics.  The patient did develop an episode of altered mental status and a code stroke was performed.  He has undergone an extensive neurological evaluation.  CT scan is initially negative for acute stroke or major blood vessel occlusion.  There is some concern about seizures but initial EEG was negative.  Other concerns include an EtOH/drug withdrawal as he was on Prozac, Xanax, trazodone and Neurontin preoperatively.  Neurology continues to follow closely.  On postop day #2 he remained hemodynamically stable with some hypertension and Cardene drip was continued to be weaned as tolerated.  His chest tubes were removed without difficulty.  CXR showed no evidence of pneumothorax.  He was hypertensive and his Lopressor dose was increased to 25 mg BID.  The patient's mental status improved without intervention and Neurology signed off.  He developed confusion and agitation on the third night after surgery and required restraints briefly for safety. Precedex was resumed and his agitation improved. On the following day, his usual Xanax dosing was resumed and the Precedex was weaned down and off as his mental status and affect returned to baseline. He had urinary retention on post-op day 4 requiring in and out catheterization that yielded over 500 ml urine.  He had recurrent urinary retention later that evening so the foley catheter was replaced. Physical therapy was requested to assist with mobility and to assess for home health needs.  On post-op day 6, the foley catheter was removed for another voiding trial. He was able to void by late morning on 03/19/21 and was otherwise felt to be ready for discharge to home.   Consults: neurology  Significant Diagnostic Studies:    LEFT HEART CATH:  Previously placed Prox RCA stent (bare-metal stent) is widely patent. 2nd Mrg lesion is 100% stenosed. Previously documented is 80%. Ost Cx to Prox Cx lesion is 45% stenosed. Prox Cx to Mid Cx lesion is 20% stenosed with 20% stenosed side branch in 1st Mrg. -------------------- The left ventricular ejection fraction is 50-55% by visual estimate. LV end diastolic pressure is normal. There is no aortic valve stenosis. 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   SUMMARY Two-vessel CAD with widely patent RCA stent (at most codominant RCA), now totally occluded small caliber OM 2 branch previously noted to be 80%.  Mild to moderate (45%) ostial LCx disease. Relatively preserved EF of roughly 55% with basal to mid inferior hypokinesis.  Treatments: surgery:   03/13/2021 Patient:  [name] Pre-Op Dx: NSTEMI LM CAD   HTN   Post-op Dx:  same Procedure: CABG X 3.  LIMA LAD, RSVG Diagonal, OM   Endoscopic greater saphenous vein harvest on the right     Surgeon and Role:       Lajuana Matte, MD - Primary  Evonnie Pat, PA-C - assisting Assistant: Leretha Pol, PA-C    Discharge Exam: Blood pressure (!) 144/89, pulse 80, temperature 98.3 F (36.8 C), temperature source Oral, resp. rate 17, height '5\' 10"'  (1.778 m), weight 61.5 kg, SpO2 96 %.  General appearance: awake and alert, cooperative Heart: regular rate and rhythm Lungs: Breath sounds are clear.  Extremities: No extremity edema.  Wounds: the sternotomy incision and RLE EVH incision are clean and dry   Discharge Medications:  The patient has been discharged on:   1.Beta Blocker:  Yes [ X  ]                              No   [   ]                              If No, reason:  2.Ace Inhibitor/ARB: Yes [   ]                                     No  [  x  ]                                     If No, reason: not appropriate, can be resumed as  outpatient.   3.Statin:   Yes [ X  ]                  No  [   ]                  If No, reason:  4.Ecasa:  Yes  [ X  ]                  No   [   ]                  If No, reason:  Patient had ACS upon admission:  Plavix/P2Y12 inhibitor: Yes [ x  ]                                      No  [   ]      Allergies as of 03/19/2021   No Known Allergies      Medication List     STOP taking these medications    budesonide-formoterol 80-4.5 MCG/ACT inhaler Commonly known as: Symbicort   fluticasone 50 MCG/ACT nasal spray Commonly known as: Flonase   HYDROcodone-acetaminophen 7.5-325 MG tablet Commonly known as: NORCO Replaced by: HYDROcodone-acetaminophen 5-325 MG tablet   isosorbide mononitrate 30 MG 24 hr tablet Commonly known as: IMDUR   meloxicam 15 MG tablet Commonly known as: MOBIC   telmisartan 40 MG tablet Commonly known as: Micardis       TAKE these medications    albuterol 108 (90 Base) MCG/ACT inhaler Commonly known as: VENTOLIN HFA Inhale 2 puffs into the lungs every 4 (four) hours as needed for wheezing or shortness of breath. What changed: Another medication with the same name was removed. Continue taking this medication, and follow the directions you see here.   ALPRAZolam 1 MG tablet Commonly known as: XANAX TAKE  1 TABLET BY MOUTH THREE TIMES A DAY AS NEEDED FOR ANXIETY What changed: See the new instructions.   amLODipine 5 MG tablet Commonly known as: NORVASC Take 5 mg by mouth daily.   aspirin EC 81 MG tablet Take 81 mg by mouth daily.   atorvastatin 80 MG tablet Commonly known as: LIPITOR TAKE 1 TABLET BY MOUTH EVERY DAY   clopidogrel 75 MG tablet Commonly known as: PLAVIX Take 1 tablet (75 mg total) by mouth daily.   Creon 36000 UNITS Cpep capsule Generic drug: lipase/protease/amylase TAKE 2 CAPSULES BY MOUTH THREE TIMES DAILY WITH MEALS. 1 CAPSULE WITH SNACKS What changed: See the new instructions.   cyanocobalamin 2000 MCG  tablet Take 2,000 mcg by mouth daily.   dicyclomine 10 MG capsule Commonly known as: BENTYL TAKE 1 CAPSULE (10 MG TOTAL) BY MOUTH 4 (FOUR) TIMES DAILY - BEFORE MEALS AND AT BEDTIME. What changed: See the new instructions.   FLUoxetine 20 MG capsule Commonly known as: PROZAC Take 1 capsule (20 mg total) by mouth daily. What changed: additional instructions   FLUoxetine 40 MG capsule Commonly known as: PROZAC Take 1 capsule (40 mg total) by mouth daily. Take with 20 mg to equal 60 mg daily What changed: additional instructions   gabapentin 300 MG capsule Commonly known as: NEURONTIN Take 2 capsules (600 mg total) by mouth 2 (two) times daily.   HYDROcodone-acetaminophen 5-325 MG tablet Commonly known as: NORCO/VICODIN Take 1 tablet by mouth every 4 (four) hours as needed for up to 5 days for moderate pain. Replaces: HYDROcodone-acetaminophen 7.5-325 MG tablet   metoprolol succinate 50 MG 24 hr tablet Commonly known as: TOPROL-XL TAKE 1 TABLET (50 MG TOTAL) BY MOUTH DAILY. PLEASE SCHEDULE FOLLOW UP APPOINTMENT. THANK YOU What changed: additional instructions   naloxone 4 MG/0.1ML Liqd nasal spray kit Commonly known as: NARCAN Use as directed What changed:  how much to take how to take this when to take this reasons to take this additional instructions   nicotine 14 mg/24hr patch Commonly known as: NICODERM CQ - dosed in mg/24 hours Place 1 patch (14 mg total) onto the skin daily. Start taking on: March 20, 2021   nitroGLYCERIN 0.4 MG SL tablet Commonly known as: NITROSTAT Place 1 tablet (0.4 mg total) under the tongue every 5 (five) minutes as needed for chest pain.   OLANZapine 10 MG tablet Commonly known as: ZyPREXA Take 1 tablet (10 mg total) by mouth at bedtime.   pantoprazole 40 MG tablet Commonly known as: PROTONIX TAKE 1 TABLET BY MOUTH TWICE A DAY BEFORE A MEAL What changed: See the new instructions.   rOPINIRole 2 MG tablet Commonly known as:  REQUIP TAKE 1 TABLET BY MOUTH EVERYDAY AT BEDTIME What changed:  how much to take how to take this when to take this additional instructions   tamsulosin 0.4 MG Caps capsule Commonly known as: FLOMAX Take 1 capsule (0.4 mg total) by mouth daily.   traZODone 50 MG tablet Commonly known as: DESYREL TAKE 1 TABLET BY MOUTH EVERYDAY AT BEDTIME What changed: See the new instructions.               Durable Medical Equipment  (From admission, onward)           Start     Ordered   03/18/21 1400  For home use only DME 3 n 1  Once        03/18/21 1359   03/18/21 1359  For home use only DME  Walker rolling  Once       Question Answer Comment  Walker: With [place]   Patient needs a walker to treat with the following condition Imbalance      03/18/21 1359            Follow-up Information     Lajuana Matte, MD. Call on 03/27/2021.   Specialty: Cardiothoracic Surgery Why: This is a telephone visit at 3:55pm with Dr. Kipp Brood.  Do not go to the office. He will call you. Contact information: [place] [address] [phone]         Verta Ellen., NP. Go on 04/03/2021.   Specialty: Cardiology Why: Your appointment is at 10:00am. Contact information: [place] [address] [phone]                 Signed: Antony Odea, PA-C  03/19/2021, 11:18 AM +

## 2021-03-17 LAB — BASIC METABOLIC PANEL
Anion gap: 9 (ref 5–15)
BUN: 21 mg/dL (ref 8–23)
CO2: 27 mmol/L (ref 22–32)
Calcium: 9 mg/dL (ref 8.9–10.3)
Chloride: 99 mmol/L (ref 98–111)
Creatinine, Ser: 0.72 mg/dL (ref 0.61–1.24)
GFR, Estimated: >60 mL/min (ref >60)
Glucose, Bld: 100 mg/dL — ABNORMAL HIGH (ref 70–99)
Potassium: 3.8 mmol/L (ref 3.5–5.1)
Sodium: 135 mmol/L (ref 135–145)

## 2021-03-17 LAB — CBC
HCT: 39 % (ref 39.0–52.0)
Hemoglobin: 13.9 g/dL (ref 13.0–17.0)
MCH: 33 pg (ref 26.0–34.0)
MCHC: 35.6 g/dL (ref 30.0–36.0)
MCV: 92.6 fL (ref 80.0–100.0)
Platelets: 210 10*3/uL (ref 150–400)
RBC: 4.21 MIL/uL — ABNORMAL LOW (ref 4.22–5.81)
RDW: 12.6 % (ref 11.5–15.5)
WBC: 11.4 10*3/uL — ABNORMAL HIGH (ref 4.0–10.5)
nRBC: 0 % (ref 0.0–0.2)

## 2021-03-17 LAB — GLUCOSE, CAPILLARY
Glucose-Capillary: 99 mg/dL (ref 70–99)
Glucose-Capillary: 106 mg/dL — ABNORMAL HIGH (ref 70–99)
Glucose-Capillary: 156 mg/dL — ABNORMAL HIGH (ref 70–99)

## 2021-03-17 MED ORDER — TAMSULOSIN HCL 0.4 MG PO CAPS
0.4000 mg | ORAL_CAPSULE | Freq: Every day | ORAL | Status: DC
  Administered 2021-03-17 – 2021-03-19 (×3): 0.4 mg via ORAL
  Filled 2021-03-17 (×3): qty 1

## 2021-03-17 MED ORDER — QUETIAPINE FUMARATE 25 MG PO TABS
25.0000 mg | ORAL_TABLET | Freq: Every day | ORAL | Status: DC
  Administered 2021-03-17 – 2021-03-18 (×2): 25 mg via ORAL
  Filled 2021-03-17 (×2): qty 1

## 2021-03-17 MED ORDER — ENOXAPARIN SODIUM 40 MG/0.4ML IJ SOSY
40.0000 mg | PREFILLED_SYRINGE | INTRAMUSCULAR | Status: DC
  Administered 2021-03-17 – 2021-03-18 (×2): 40 mg via SUBCUTANEOUS
  Filled 2021-03-17 (×2): qty 0.4

## 2021-03-17 MED ORDER — ASPIRIN EC 81 MG PO TBEC
81.0000 mg | DELAYED_RELEASE_TABLET | Freq: Every day | ORAL | Status: DC
  Administered 2021-03-17 – 2021-03-19 (×3): 81 mg via ORAL
  Filled 2021-03-17 (×3): qty 1

## 2021-03-17 MED ORDER — ALPRAZOLAM 0.5 MG PO TABS: 1.0000 mg | ORAL_TABLET | Freq: Three times a day (TID) | ORAL | Status: DC | PRN

## 2021-03-17 NOTE — Progress Notes (Signed)
Patient has been unable to urinate since on unit. Verbal report noted pt had not urinated since morning. Bladder scan done with 376 mL. Notified PA. Waiting on in and out cath order.  Daymon Larsen, RN

## 2021-03-17 NOTE — Progress Notes (Signed)
Patient brought to 4E from [facility]. VSS. Telemetry box applied, CCMD notified. CHG bath completed. Patient oriented to room and staff. Call bell in reach. Wife present.  Daymon Larsen, RN

## 2021-03-17 NOTE — Progress Notes (Signed)
CARDIAC REHAB PHASE I   PRE:  Rate/Rhythm: 102 ST    BP: sitting 119/79    SaO2: 89 3L  MODE:  Ambulation: 80 ft   POST:  Rate/Rhythm: 114 ST    BP: transferring     SaO2: 95 4L  Pt off precedex but very sleepy. Able to follow commands. Stood with verbal cues and walked with eva, gait belt, 4L O2, min assist. Slow, steps with narrow base. No major c/o but fatigue with distance. To w/c to transfer.  Wife present, sts grandson will stay tonight. [place], ACSM 03/17/2021 2:22 PM

## 2021-03-17 NOTE — Progress Notes (Signed)
In and out cath performed per order with 500 mL urine output. Patient tolerated well.   If pt unable to void again tonight per verbal order by PA a urinary catheter may be placed.  Daymon Larsen, RN

## 2021-03-17 NOTE — Progress Notes (Addendum)
[facility] DAILY ICU PROGRESS NOTE                   [address] [place]            [place] [address]          [phone]   4 Days Post-Op Procedure(s) (LRB): CORONARY ARTERY BYPASS GRAFTING (CABG), ON PUMP, TIMES THREE, USING LEFT INTERNAL MAMMARY ARTERY AND RIGHT ENDOSCOPICALLY HARVESTED GREATER SAPHENOUS VEIN (N/A) TRANSESOPHAGEAL ECHOCARDIOGRAM (TEE) (N/A) ENDOVEIN HARVEST OF GREATER SAPHENOUS VEIN (Right)  Total Length of Stay:  LOS: 6 days   Subjective:  Became agitated and combative last night.  He is currently sedated with a Precedex infusion.  Objective: Vital signs in last 24 hours: Temp:  [97 F (36.1 C)-98.3 F (36.8 C)] 97 F (36.1 C) (10/04 0804) Pulse Rate:  [77-101] 77 (10/04 0600) Cardiac Rhythm: Normal sinus rhythm (10/03 2038) Resp:  [16-19] 16 (10/04 0600) BP: (95-187)/(63-110) 101/77 (10/04 0600) SpO2:  [91 %-100 %] 100 % (10/04 0600) Weight:  [64.2 kg] 64.2 kg (10/04 0500)  Filed Weights   03/15/21 0419 03/16/21 0500 03/17/21 0500  Weight: 67 kg 65.3 kg 64.2 kg    Weight change: -1.1 kg   Intake/Output from previous day: 10/03 0701 - 10/04 0700 In: 439 [P.O.:300; I.V.:139] Out: 1797 [Urine:1797]  Current Meds: Scheduled Meds:  acetaminophen  1,000 mg Oral Q6H   Or   acetaminophen (TYLENOL) oral liquid 160 mg/5 mL  1,000 mg Per Tube Q6H   aspirin EC  325 mg Oral Daily   Or   aspirin  324 mg Per Tube Daily   atorvastatin  80 mg Oral Daily   bisacodyl  10 mg Oral Daily   Or   bisacodyl  10 mg Rectal Daily   Chlorhexidine Gluconate Cloth  6 each Topical Q0600   clopidogrel  75 mg Oral Daily   cyanocobalamin  1,000 mcg Intramuscular Once   Followed by   [START ON 03/18/2021] vitamin B-12  1,000 mcg Oral Daily   docusate sodium  200 mg Oral Daily   FLUoxetine  60 mg Oral Daily   guaiFENesin  1,200 mg Oral BID   hydrALAZINE       metoprolol tartrate  25 mg Oral BID   mupirocin ointment  1 application Nasal BID   nicotine  21 mg  Transdermal Daily   pantoprazole  40 mg Oral Daily   potassium chloride  40 mEq Oral Daily   QUEtiapine  25 mg Oral QHS   sodium chloride flush  10-40 mL Intracatheter Q12H   sodium chloride flush  3 mL Intravenous Q12H   tamsulosin  0.4 mg Oral Daily   Continuous Infusions:  sodium chloride Stopped (03/14/21 0023)   sodium chloride Stopped (03/14/21 1231)   sodium chloride     dexmedetomidine (PRECEDEX) IV infusion 0.9 mcg/kg/hr (03/17/21 0515)   lactated ringers Stopped (03/13/21 2018)   lactated ringers 20 mL/hr at 03/14/21 1100   PRN Meds:.sodium chloride, sodium chloride, albuterol, ALPRAZolam, metoprolol tartrate, ondansetron (ZOFRAN) IV, oxyCODONE, sodium chloride flush, sodium chloride flush, traMADol  General appearance: moderately sedated with Precedex. Restraints are off. Will awaken and answer questions. Moves all extremities Heart: regular rate and rhythm Lungs: Breath sounds are clear.  Extremities: No extremity edema.  Wound: clean and dry  Lab Results: CBC: Recent Labs    03/16/21 0542 03/17/21 0552  WBC 12.4* 11.4*  HGB 13.6 13.9  HCT 38.1* 39.0  PLT 164 210  BMET:  Recent Labs    03/16/21 0542 03/17/21 0552  NA 134* 135  K 3.3* 3.8  CL 98 99  CO2 25 27  GLUCOSE 125* 100*  BUN 10 21  CREATININE 0.64 0.72  CALCIUM 8.5* 9.0     CMET: Lab Results  Component Value Date   WBC 11.4 (H) 03/17/2021   HGB 13.9 03/17/2021   HCT 39.0 03/17/2021   PLT 210 03/17/2021   GLUCOSE 100 (H) 03/17/2021   CHOL 85 03/11/2021   TRIG 82 03/11/2021   HDL 42 03/11/2021   LDLDIRECT 51.0 08/29/2019   LDLCALC 27 03/11/2021   ALT 40 03/11/2021   AST 57 (H) 03/11/2021   NA 135 03/17/2021   K 3.8 03/17/2021   CL 99 03/17/2021   CREATININE 0.72 03/17/2021   BUN 21 03/17/2021   CO2 27 03/17/2021   TSH 1.154 03/11/2021   PSA 0.76 06/20/2014   INR 1.3 (H) 03/13/2021   HGBA1C 4.9 03/11/2021   MICROALBUR 0.50 01/09/2013      PT/INR:  No results for  input(s): LABPROT, INR in the last 72 hours.  Radiology: No results found.   Assessment/Plan: S/P Procedure(s) (LRB): CORONARY ARTERY BYPASS GRAFTING (CABG), ON PUMP, TIMES THREE, USING LEFT INTERNAL MAMMARY ARTERY AND RIGHT ENDOSCOPICALLY HARVESTED GREATER SAPHENOUS VEIN (N/A) TRANSESOPHAGEAL ECHOCARDIOGRAM (TEE) (N/A) ENDOVEIN HARVEST OF GREATER SAPHENOUS VEIN (Right)  CV-stable cardiac status postop day 4 CABG x3.  Remains in stable sinus rhythm.  BP better with Lopressor dose titrated to 25 mg BID.  Begin Plavix for his non-ST elevation myocardial infarction on initial presentation.  Reducing aspirin to 81 mg p.o. daily Pulm-O2 at 2 to 3 L/min.  Sats are satisfactory Renal- creatinine is normal and urine output adequate.  Hypokalemia corrected. Neuro-became agitated and combative last night, demanding to leave the hospital.  This improved significantly with initiation of Precedex infusion.  Neuro work-up negative today.  We will try starting him back on his usual dose of Xanax and wean off the Precedex while observing his mental status.  We will plan to start well 25 mg p.o. daily tonight at bedtime  5.    Dispo- patient stable hemodynamically.  He has had some confusion and agitation requiring restraint and restarting of the Precedex drip last evening.  He is more calm and cooperative this morning, restraints are off and the Precedex is being slowly decreased.  We will attempt to get him back on his usual dose of Xanax 1 mg p.o. 3 times daily for anxiety.  Keep in the ICU until agitation is resolved  Antony Odea, PA-C [phone] 03/17/2021 8:43 AM  Agree with above. Pt was scheduled to stay in the unit Transitioned off precedex, and started on home dose of xanax Will start plavix  Faythe Heitzenrater O Senie Lanese

## 2021-03-18 MED ORDER — CHLORHEXIDINE GLUCONATE CLOTH 2 % EX PADS
6.0000 | MEDICATED_PAD | Freq: Every day | CUTANEOUS | Status: DC
  Administered 2021-03-18: 6 via TOPICAL

## 2021-03-18 NOTE — Progress Notes (Signed)
Pt is alert and fully oriented x 4, afebrile, hemodynamically stable.  NSR on monitor. After 7 hours post intermittent urine cath on day shift, Pt was unable to urinate. Per RN day shift reported, Pt got twice of intermittent cath on day shift yesterday. Bladder scan found > 400 ml of urine retention this time. Pt agreed for indwelling Foley catheter. Latex 14 Fr of Foley cath inserted with sterile technique and no difficulties. Pt and a family member were educated. We will monitor.  Kennyth Lose, RN

## 2021-03-18 NOTE — Progress Notes (Addendum)
[address]            [place] [address]          [phone]   5 Days Post-Op Procedure(s) (LRB): CORONARY ARTERY BYPASS GRAFTING (CABG), ON PUMP, TIMES THREE, USING LEFT INTERNAL MAMMARY ARTERY AND RIGHT ENDOSCOPICALLY HARVESTED GREATER SAPHENOUS VEIN (N/A) TRANSESOPHAGEAL ECHOCARDIOGRAM (TEE) (N/A) ENDOVEIN HARVEST OF GREATER SAPHENOUS VEIN (Right)  Total Length of Stay:  LOS: 7 days   Subjective:  Weaned off of Precedex early in the day yesterday.  He has remained calm and cooperative. Rested well last night.   Foley catheter replaced after he had urinary retention yesterday requiring in and out cath x 2.   Objective: Vital signs in last 24 hours: Temp:  [97 F (36.1 C)-98.6 F (37 C)] 98.3 F (36.8 C) (10/05 0348) Pulse Rate:  [76-98] 82 (10/05 0348) Cardiac Rhythm: Normal sinus rhythm (10/05 0316) Resp:  [20-25] 21 (10/05 0348) BP: (92-124)/(58-80) 112/75 (10/05 0348) SpO2:  [87 %-96 %] 96 % (10/05 0348) Weight:  [66.5 kg] 66.5 kg (10/05 0316)  Filed Weights   03/17/21 1339 03/17/21 2328 03/18/21 0316  Weight: 66.5 kg 66.5 kg 66.5 kg    Weight change: 2.3 kg   Intake/Output from previous day: 10/04 0701 - 10/05 0700 In: 310 [P.O.:250; I.V.:60] Out: 1300 [Urine:1300]  Current Meds: Scheduled Meds:  acetaminophen  1,000 mg Oral Q6H   Or   acetaminophen (TYLENOL) oral liquid 160 mg/5 mL  1,000 mg Per Tube Q6H   aspirin EC  81 mg Oral Daily   atorvastatin  80 mg Oral Daily   bisacodyl  10 mg Oral Daily   Or   bisacodyl  10 mg Rectal Daily   Chlorhexidine Gluconate Cloth  6 each Topical Daily   clopidogrel  75 mg Oral Daily   docusate sodium  200 mg Oral Daily   enoxaparin (LOVENOX) injection  40 mg Subcutaneous Q24H   FLUoxetine  60 mg Oral Daily   guaiFENesin  1,200 mg Oral BID   metoprolol tartrate  25 mg Oral BID   mupirocin ointment  1 application Nasal BID   nicotine  21 mg Transdermal Daily   pantoprazole   40 mg Oral Daily   potassium chloride  40 mEq Oral Daily   QUEtiapine  25 mg Oral QHS   sodium chloride flush  10-40 mL Intracatheter Q12H   sodium chloride flush  3 mL Intravenous Q12H   tamsulosin  0.4 mg Oral Daily   vitamin B-12  1,000 mcg Oral Daily   Continuous Infusions:  sodium chloride Stopped (03/14/21 0023)   sodium chloride Stopped (03/14/21 1231)   sodium chloride     dexmedetomidine (PRECEDEX) IV infusion Stopped (03/17/21 1209)   lactated ringers Stopped (03/13/21 2018)   lactated ringers 20 mL/hr at 03/14/21 1100   PRN Meds:.sodium chloride, sodium chloride, albuterol, ALPRAZolam, metoprolol tartrate, ondansetron (ZOFRAN) IV, oxyCODONE, sodium chloride flush, sodium chloride flush, traMADol  General appearance: awake and alert, cooperative Heart: regular rate and rhythm Lungs: Breath sounds are clear.  Extremities: No extremity edema.  Wounds: the sternotomy incision and RLE EVH incision are clean and dry  Lab Results: CBC: Recent Labs    03/16/21 0542 03/17/21 0552  WBC 12.4* 11.4*  HGB 13.6 13.9  HCT 38.1* 39.0  PLT 164 210    BMET:  Recent Labs    03/16/21  0542 03/17/21 0552  NA 134* 135  K 3.3* 3.8  CL 98 99  CO2 25 27  GLUCOSE 125* 100*  BUN 10 21  CREATININE 0.64 0.72  CALCIUM 8.5* 9.0     CMET: Lab Results  Component Value Date   WBC 11.4 (H) 03/17/2021   HGB 13.9 03/17/2021   HCT 39.0 03/17/2021   PLT 210 03/17/2021   GLUCOSE 100 (H) 03/17/2021   CHOL 85 03/11/2021   TRIG 82 03/11/2021   HDL 42 03/11/2021   LDLDIRECT 51.0 08/29/2019   LDLCALC 27 03/11/2021   ALT 40 03/11/2021   AST 57 (H) 03/11/2021   NA 135 03/17/2021   K 3.8 03/17/2021   CL 99 03/17/2021   CREATININE 0.72 03/17/2021   BUN 21 03/17/2021   CO2 27 03/17/2021   TSH 1.154 03/11/2021   PSA 0.76 06/20/2014   INR 1.3 (H) 03/13/2021   HGBA1C 4.9 03/11/2021   MICROALBUR 0.50 01/09/2013      PT/INR:  No results for input(s): LABPROT, INR in the last 72  hours.  Radiology: No results found.   Assessment/Plan: S/P Procedure(s) (LRB): CORONARY ARTERY BYPASS GRAFTING (CABG), ON PUMP, TIMES THREE, USING LEFT INTERNAL MAMMARY ARTERY AND RIGHT ENDOSCOPICALLY HARVESTED GREATER SAPHENOUS VEIN (N/A) TRANSESOPHAGEAL ECHOCARDIOGRAM (TEE) (N/A) ENDOVEIN HARVEST OF GREATER SAPHENOUS VEIN (Right)  CV-stable cardiac status postop day 4 CABG x3.  Remains in stable sinus rhythm.  BP stable on Lopressor at 25 mg BID. Continue Plavix for his non-ST elevation myocardial infarction on initial presentation.   Pulm-O2 at 2 to 3 L/min.  Sats are satisfactory. Push ambulation and pulmonary hygiene today.  Renal- creatinine is normal and urine output adequate.  Hypokalemia corrected. Recheck Lab in AM.  Neuro-became agitated and combative POD3, this has resolved. Continue his usual Xanax and Seroquel qhs.  Urinary retention- leave Foley in place today. Continue Flomax and attempt voiding trial tomorrow   Antony Odea, PA-C [phone] 03/18/2021 7:49 AM  Agree with above Urinary retention overnight.  Foley replaced Mental status much improved PT ordered [place]

## 2021-03-18 NOTE — Evaluation (Signed)
Physical Therapy Evaluation Patient Details Name: [name] MRN: [id number] DOB: 03/19/1956 Today's Date: 03/18/2021  History of Present Illness  [name] is a 65 y.o. male with coronary disease, PCI with a bare-metal stent to the RCA in 2013 and residual obstructive disease of an OM on catheterization in 2021, COPD, ongoing smoking, prior gastrointestinal bleeding and H. pylori, hypertension who is being seen 03/11/2021 for the evaluation of STEMI.   [name] is a 65 y.o. male with coronary disease, PCI with a bare-metal stent to the RCA in 2013 and residual obstructive disease of an OM on catheterization in 2021, COPD, ongoing smoking, prior gastrointestinal bleeding and H. pylori, hypertension who is being seen 03/11/2021 for the evaluation of STEMI. Pt underwent CABGx3 on 9/30.   Clinical Impression  Pt with good tolerance of ambulation on RA, SpO2 >91% on RA. Pt functioning at min guard level. Pt to benefit from RW for ambulation for stability and energy conservation. Acute PT to cont to follow.       Recommendations for follow up therapy are one component of a multi-disciplinary discharge planning process, led by the attending physician.  Recommendations may be updated based on patient status, additional functional criteria and insurance authorization.  Follow Up Recommendations No PT follow up;Supervision/Assistance - 24 hour (pt to benefit from cardiac rehab when appropriate)    Equipment Recommendations  Rolling walker with 5" wheels;3in1 (PT) (if patient doesn't have one)    Recommendations for Other Services       Precautions / Restrictions Precautions Precautions: Sternal Precaution Booklet Issued: No Precaution Comments: pt able to recall 2/3, pt re-educated on them Restrictions Weight Bearing Restrictions: Yes Other Position/Activity Restrictions: limited push/pull      Mobility  Bed Mobility Overal bed mobility: Needs Assistance Bed Mobility: Supine to  Sit     Supine to sit: Mod assist     General bed mobility comments: modA for trunk elevation however pt with hard time rolling to sidelying and attempted to elevated trunk into long sit    Transfers Overall transfer level: Needs assistance Equipment used: None Transfers: Sit to/from Stand Sit to Stand: Min assist         General transfer comment: verbal cues to use momentum to rock forward and push up with LEs and minimally use UEs, minA to power up and steady during transition of hands from bed to RW  Ambulation/Gait Ambulation/Gait assistance: Min guard Gait Distance (Feet): 150 Feet Assistive device: Rolling walker (2 wheeled) Gait Pattern/deviations: Step-through pattern;Decreased stride length Gait velocity: dec Gait velocity interpretation: <1.31 ft/sec, indicative of household ambulator General Gait Details: pt with good fluid gait pattern, pt with some difficulty with walker management during turns but responded well to verbal cues, pt reports mild fatigue and has noted SOB, SpO2 >91% on RA during amb, 1 standing rest break  Stairs            Wheelchair Mobility    Modified Rankin (Stroke Patients Only)       Balance Overall balance assessment: Mild deficits observed, not formally tested                                           Pertinent Vitals/Pain Pain Assessment: No/denies pain    Home Living Family/patient expects to be discharged to:: Private residence Living Arrangements: Spouse/significant other Available Help at Discharge: Family;Available  24 hours/day Type of Home: House Home Access: Stairs to enter Entrance Stairs-Rails: Can reach both Entrance Stairs-Number of Steps: 6 Home Layout: One level Home Equipment:  (pt unsure but thinks he has a RW and 3n1)      Prior Function Level of Independence: Independent               Hand Dominance   Dominant Hand: Right    Extremity/Trunk Assessment   Upper  Extremity Assessment Upper Extremity Assessment: Generalized weakness    Lower Extremity Assessment Lower Extremity Assessment: Generalized weakness    Cervical / Trunk Assessment Cervical / Trunk Assessment: Normal  Communication   Communication: No difficulties  Cognition Arousal/Alertness: Awake/alert Behavior During Therapy: Flat affect Overall Cognitive Status: Within Functional Limits for tasks assessed                                 General Comments: pt quiet, flat affect but cooperative and followed commands      General Comments General comments (skin integrity, edema, etc.): chest incision without drainage    Exercises General Exercises - Lower Extremity Ankle Circles/Pumps: AROM;Both;10 reps;Seated Long Arc Quad: AROM;Both;10 reps;Seated Hip Flexion/Marching: AROM;Both;10 reps;Seated   Assessment/Plan    PT Assessment Patient needs continued PT services  PT Problem List Decreased strength;Decreased activity tolerance;Decreased balance;Decreased mobility;Decreased coordination;Decreased knowledge of use of DME       PT Treatment Interventions DME instruction;Gait training;Stair training;Functional mobility training;Therapeutic activities;Therapeutic exercise;Balance training    PT Goals (Current goals can be found in the Care Plan section)  Acute Rehab PT Goals Patient Stated Goal: get better PT Goal Formulation: With patient Time For Goal Achievement: 04/01/21 Potential to Achieve Goals: Good    Frequency Min 3X/week   Barriers to discharge        Co-evaluation               AM-PAC PT "6 Clicks" Mobility  Outcome Measure Help needed turning from your back to your side while in a flat bed without using bedrails?: A Little Help needed moving from lying on your back to sitting on the side of a flat bed without using bedrails?: A Little Help needed moving to and from a bed to a chair (including a wheelchair)?: A Little Help needed  standing up from a chair using your arms (e.g., wheelchair or bedside chair)?: A Little Help needed to walk in hospital room?: A Little Help needed climbing 3-5 steps with a railing? : A Little 6 Click Score: 18    End of Session Equipment Utilized During Treatment: Gait belt Activity Tolerance: Patient tolerated treatment well Patient left: in chair;with call bell/phone within reach Nurse Communication: Mobility status PT Visit Diagnosis: Unsteadiness on feet (R26.81);Muscle weakness (generalized) (M62.81);Difficulty in walking, not elsewhere classified (R26.2)    Time: 4259-5638 PT Time Calculation (min) (ACUTE ONLY): 19 min   Charges:   PT Evaluation $PT Eval Moderate Complexity: 1 Mod          Kittie Plater, PT, DPT Acute Rehabilitation Services Pager #: [phone] Office #: [phone]   Berline Lopes 03/18/2021, 12:48 PM

## 2021-03-18 NOTE — Progress Notes (Signed)
CARDIAC REHAB PHASE I   PRE:  Rate/Rhythm: 87 SR    BP: sitting 108/83    SaO2: 97 RA  MODE:  Ambulation: 430 ft   POST:  Rate/Rhythm: 96 SR    BP: sitting 120/84     SaO2: 94 RA  Pt much improved today. Scooted hips to edge of recliner and stood independently with verbal cues. Ambulated with RW, steady, appropriate foot placement. Comments fatigue with distance. Tolerated well. To bed after walk, VSS. Encouraged IS. Doing well. [place], ACSM 03/18/2021 2:28 PM

## 2021-03-18 NOTE — Care Management Important Message (Signed)
Important Message  Patient Details  Name: [name] MRN: [id number] Date of Birth: 15-Nov-1955   Medicare Important Message Given:  Yes     Shelda Altes 03/18/2021, 10:32 AM

## 2021-03-18 NOTE — Discharge Instructions (Signed)

## 2021-03-19 ENCOUNTER — Inpatient Hospital Stay (HOSPITAL_COMMUNITY): Payer: Medicare PPO

## 2021-03-19 LAB — BASIC METABOLIC PANEL
Anion gap: 12 (ref 5–15)
BUN: 22 mg/dL (ref 8–23)
CO2: 21 mmol/L — ABNORMAL LOW (ref 22–32)
Calcium: 8.5 mg/dL — ABNORMAL LOW (ref 8.9–10.3)
Chloride: 103 mmol/L (ref 98–111)
Creatinine, Ser: 0.65 mg/dL (ref 0.61–1.24)
GFR, Estimated: >60 mL/min (ref >60)
Glucose, Bld: 140 mg/dL — ABNORMAL HIGH (ref 70–99)
Potassium: 3.8 mmol/L (ref 3.5–5.1)
Sodium: 136 mmol/L (ref 135–145)

## 2021-03-19 MED ORDER — TAMSULOSIN HCL 0.4 MG PO CAPS: 0.4000 mg | ORAL_CAPSULE | Freq: Every day | ORAL | 1 refills | Status: DC

## 2021-03-19 MED ORDER — HYDROCODONE-ACETAMINOPHEN 5-325 MG PO TABS: 1.0000 | ORAL_TABLET | ORAL | 0 refills | Status: AC | PRN

## 2021-03-19 MED ORDER — CLOPIDOGREL BISULFATE 75 MG PO TABS: 75.0000 mg | ORAL_TABLET | Freq: Every day | ORAL | 11 refills | Status: DC

## 2021-03-19 MED ORDER — NICOTINE 14 MG/24HR TD PT24: 14.0000 mg | MEDICATED_PATCH | Freq: Every day | TRANSDERMAL | Status: DC

## 2021-03-19 NOTE — Progress Notes (Signed)
Noted orders for DME- RW and 3n1- per pt he has needed DME at home- no DME referral made. Pt to discharge home today.

## 2021-03-19 NOTE — Progress Notes (Signed)
Patient given discharge instructions medication list and prescriptions sent to personal pharmacy. Follow up appointments also given. IV and tele were dcd. Will discharge home as ordered. Transported to exit via wheel chair and nursing staff. Trimaine Maser, Bettina Gavia RN

## 2021-03-19 NOTE — Progress Notes (Addendum)
[address]            [place] [address]          [phone]   6 Days Post-Op Procedure(s) (LRB): CORONARY ARTERY BYPASS GRAFTING (CABG), ON PUMP, TIMES THREE, USING LEFT INTERNAL MAMMARY ARTERY AND RIGHT ENDOSCOPICALLY HARVESTED GREATER SAPHENOUS VEIN (N/A) TRANSESOPHAGEAL ECHOCARDIOGRAM (TEE) (N/A) ENDOVEIN HARVEST OF GREATER SAPHENOUS VEIN (Right)  Total Length of Stay:  LOS: 8 days   Subjective:  Awake, calm and cooperative. Rested well, now mostly independent with mobility and transfers.  BM yesterday.   Objective: Vital signs in last 24 hours: Temp:  [97.6 F (36.4 C)-98.4 F (36.9 C)] 98.3 F (36.8 C) (10/06 0733) Pulse Rate:  [76-87] 80 (10/06 0733) Cardiac Rhythm: Normal sinus rhythm (10/05 1941) Resp:  [16-20] 17 (10/06 0733) BP: (110-150)/(68-91) 144/89 (10/06 0733) SpO2:  [94 %-98 %] 96 % (10/06 0733) Weight:  [61.5 kg] 61.5 kg (10/06 0500)  Filed Weights   03/17/21 2328 03/18/21 0316 03/19/21 0500  Weight: 66.5 kg 66.5 kg 61.5 kg    Weight change: -5.037 kg   Intake/Output from previous day: 10/05 0701 - 10/06 0700 In: 1078 [P.O.:1078] Out: 1200 [Urine:1200]  Current Meds: Scheduled Meds:  aspirin EC  81 mg Oral Daily   atorvastatin  80 mg Oral Daily   bisacodyl  10 mg Oral Daily   Or   bisacodyl  10 mg Rectal Daily   Chlorhexidine Gluconate Cloth  6 each Topical Daily   clopidogrel  75 mg Oral Daily   docusate sodium  200 mg Oral Daily   enoxaparin (LOVENOX) injection  40 mg Subcutaneous Q24H   FLUoxetine  60 mg Oral Daily   guaiFENesin  1,200 mg Oral BID   metoprolol tartrate  25 mg Oral BID   nicotine  21 mg Transdermal Daily   pantoprazole  40 mg Oral Daily   potassium chloride  40 mEq Oral Daily   QUEtiapine  25 mg Oral QHS   tamsulosin  0.4 mg Oral Daily   vitamin B-12  1,000 mcg Oral Daily   Continuous Infusions:   PRN Meds:.albuterol, ALPRAZolam, metoprolol tartrate, ondansetron (ZOFRAN)  IV, oxyCODONE, traMADol  General appearance: awake and alert, cooperative Heart: regular rate and rhythm Lungs: Breath sounds are clear.  Extremities: No extremity edema.  Wounds: the sternotomy incision and RLE EVH incision are clean and dry  Lab Results: CBC: Recent Labs    03/17/21 0552  WBC 11.4*  HGB 13.9  HCT 39.0  PLT 210    BMET:  Recent Labs    03/17/21 0552 03/19/21 0123  NA 135 136  K 3.8 3.8  CL 99 103  CO2 27 21*  GLUCOSE 100* 140*  BUN 21 22  CREATININE 0.72 0.65  CALCIUM 9.0 8.5*     CMET: Lab Results  Component Value Date   WBC 11.4 (H) 03/17/2021   HGB 13.9 03/17/2021   HCT 39.0 03/17/2021   PLT 210 03/17/2021   GLUCOSE 140 (H) 03/19/2021   CHOL 85 03/11/2021   TRIG 82 03/11/2021   HDL 42 03/11/2021   LDLDIRECT 51.0 08/29/2019   LDLCALC 27 03/11/2021   ALT 40 03/11/2021   AST 57 (H) 03/11/2021   NA 136 03/19/2021   K 3.8 03/19/2021   CL 103 03/19/2021   CREATININE 0.65 03/19/2021   BUN 22 03/19/2021   CO2 21 (  L) 03/19/2021   TSH 1.154 03/11/2021   PSA 0.76 06/20/2014   INR 1.3 (H) 03/13/2021   HGBA1C 4.9 03/11/2021   MICROALBUR 0.50 01/09/2013      PT/INR:  No results for input(s): LABPROT, INR in the last 72 hours.  Radiology: No results found.   Assessment/Plan: S/P Procedure(s) (LRB): CORONARY ARTERY BYPASS GRAFTING (CABG), ON PUMP, TIMES THREE, USING LEFT INTERNAL MAMMARY ARTERY AND RIGHT ENDOSCOPICALLY HARVESTED GREATER SAPHENOUS VEIN (N/A) TRANSESOPHAGEAL ECHOCARDIOGRAM (TEE) (N/A) ENDOVEIN HARVEST OF GREATER SAPHENOUS VEIN (Right)  CV-stable cardiac status postop day 6 CABG x3.  Remains in stable sinus rhythm.  BP stable on Lopressor at 25 mg BID. On Plavix for his non-ST elevation myocardial infarction on initial presentation.   Pulm-now on RA with adequate SaO2.  Renal- creatinine is normal and urine output adequate.  Hypokalemia corrected.  Neuro-became agitated and combative POD3, this has resolved. Continue  his usual Xanax and Seroquel qhs.  Urinary retention- On Flomax, voiding trial today. Disposition: Discharge later today. Hopefully will be able to void on his own. If not, will plan to discharge with Foley cath in place and f/u with urology as outpatient.    Antony Odea, PA-C [phone] 03/19/2021 7:43 AM   11:07am ADDENDUM: Mr. Winthrop was returning from the restroom to his bed.  States he was able to void.  He would like to return home.  Instructions given and follow up arranged.   Macarthur Critchley, PA-C

## 2021-03-19 NOTE — Progress Notes (Signed)
Pt moving around room independently, excited to go home. Discussed IS, sternal precautions, smoking cessation, diet, exercise, and CRPII. Pt receptive. Eager to quit smoking. Will refer to [place].  [place], ACSM 11:57 AM 03/19/2021

## 2021-03-20 ENCOUNTER — Ambulatory Visit: Payer: Medicare PPO | Admitting: Gastroenterology

## 2021-03-20 ENCOUNTER — Telehealth: Payer: Self-pay | Admitting: *Deleted

## 2021-03-20 NOTE — Telephone Encounter (Signed)
Spoke with wife(DPR) Wife stated patient had a heart attack that required triple by pass surgery and the patient had multiple complications after the surgery including a stroke. Wife states the patient is very weak and requires assistance to get up and perform ADL. Wife stated the patient has difficulty still with talking and gets confused at times. Wife stated the patient is unable to come in the office for a [facility]- hospital follow up at this time. Wife stated the patient does have close follow up with cardiologist and thoracic surgeon and even the appointment with them next week is televisit because he is unable to get to office at this time. Wife states when the patient is doing better and completed therapy and stronger and more mobile she will schedule an appointment to follow up with Dr [name]. Wife advised to call our office if the patient is having any problems or concerns or call 911 if emergency. Wife verbalized understanding.

## 2021-03-22 ENCOUNTER — Other Ambulatory Visit: Payer: Self-pay | Admitting: Family Medicine

## 2021-03-23 ENCOUNTER — Telehealth: Payer: Self-pay | Admitting: Internal Medicine

## 2021-03-23 NOTE — Telephone Encounter (Signed)
Patient wife called and patient had a triple bypass and needs to cancel his procedure

## 2021-03-23 NOTE — Telephone Encounter (Signed)
Message sent to endo to cancel procedure. FYI to Dr. [name]

## 2021-03-25 MED FILL — Mannitol IV Soln 20%: INTRAVENOUS | Qty: 500 | Status: AC

## 2021-03-25 MED FILL — Calcium Chloride Inj 10%: INTRAVENOUS | Qty: 10 | Status: AC

## 2021-03-25 MED FILL — Sodium Chloride IV Soln 0.9%: INTRAVENOUS | Qty: 2000 | Status: AC

## 2021-03-25 MED FILL — Heparin Sodium (Porcine) Inj 1000 Unit/ML: INTRAMUSCULAR | Qty: 10 | Status: AC

## 2021-03-25 MED FILL — Electrolyte-R (PH 7.4) Solution: INTRAVENOUS | Qty: 4000 | Status: AC

## 2021-03-25 MED FILL — Sodium Bicarbonate IV Soln 8.4%: INTRAVENOUS | Qty: 50 | Status: AC

## 2021-03-26 ENCOUNTER — Ambulatory Visit (HOSPITAL_COMMUNITY): Payer: Medicare PPO

## 2021-03-27 ENCOUNTER — Other Ambulatory Visit: Payer: Self-pay

## 2021-03-27 ENCOUNTER — Encounter: Payer: Self-pay | Admitting: Thoracic Surgery (Cardiothoracic Vascular Surgery)

## 2021-03-27 NOTE — Progress Notes (Unsigned)
.  hlv

## 2021-03-30 ENCOUNTER — Other Ambulatory Visit: Payer: Self-pay | Admitting: Family Medicine

## 2021-04-01 NOTE — Telephone Encounter (Signed)
So noted 

## 2021-04-02 NOTE — Progress Notes (Signed)
Cardiology Office Note  Date: 04/03/2021   ID: [name], DOB Nov 03, 1955, MRN [id number]  PCP:  [name], MD  Cardiologist:  Carlyle Dolly, MD Electrophysiologist:  None   Chief Complaint: Cardiac follow-up  History of Present Illness: [name] is a 65 y.o. male with a history of CAD,: CAD, COPD, diverticulitis, gastritis, GERD, GI bleed, hiatal hernia, hyperlipidemia, hypertension, leukocytosis, lung nodule, Schatzki's ring, sleep apnea, lung nodule, MI, restless leg syndrome, tobacco use, asthma.  Previous visit to [facility] on 08/28/2019 for worsening dyspnea and chest discomfort.  He was eventually transported to [facility] where he underwent a cardiac catheterization demonstrating a widely patent RCA stent and now totally occluded OM 2 branch.  Did have mild to moderate ostial LCx disease and mild plaque along the LAD.  OM branch was not felt to be a good PCI target given small caliber and location.  Continued medical management was recommended.  He was continued on aspirin, atorvastatin, lisinopril, Toprol, Imdur   He presented to [facility] emergency department on 03/10/2021 with complaint of chest pain onset was 6 hours prior to arrival.  Described as heaviness, pressure, tightness.  He tried taking nitroglycerin but symptoms never improved and pain continued to worsen throughout the day.  His initial troponin was 3242.  He had a new left bundle branch block.  He was transferred to [facility] with diagnosis STEMI for further evaluation.  Cardiac catheterization demonstrated three-vessel CAD and in-stent restenosis.  See cardiac catheterization report below.  He was not deemed a good candidate candidate for repeat PCI.  Cardiothoracic surgery was consulted.  On 03/13/2021 he underwent three-vessel CABG by Dr. Kipp Brood with LIMA-LAD, SVG-diagonal, SVG-OM.  He is here today for hospital follow-up status post CABG.  He states he feels weak and tired and sore from the  incision/median sternotomy.  He states he has not smoked for 17 days.  Does not plan to restart.  Blood pressure is well controlled today at 110/80.  He denies any anginal symptoms.  Denies any shortness of breath.  He is not very active at the moment.  He states his median sternotomy site and right vein graft harvesting site look good.  He has an area on his left wrist with a scar from what appears to be an attempt at left radial harvesting.  He denies any palpitations or arrhythmias, orthostatic symptoms, CVA or TIA-like symptoms, PND, orthopnea.  No bleeding.  No claudication-like symptoms, DVT or PE-like symptoms, or lower extremity edema.  Current cardiac regimen includes aspirin 81 mg daily, atorvastatin 80 mg daily, amlodipine 5 mg daily, Plavix 75 mg daily, metoprolol 50 mg daily, sublingual nitroglycerin as needed  Past Medical History:  Diagnosis Date   Anxiety    Anxiety and depression    ASCVD (arteriosclerotic cardiovascular disease)    a. s/p PTCA to LCx in 1993 b. BMS to RCA in 01/2012 with residual 80% OM2 stenosis and medical management recommended c. 08/2019: cath showing patent stents with occluded OM2 --> medical management.    Asthma    CAD (coronary artery disease)    Carcinoma in situ of colon 2004   rectal polyp   Colitis, ischemic ([place]) 2011   COPD (chronic obstructive pulmonary disease) (HCC)    mild ;excercise induced hypoxemia by cp stress test ;asthma ,bronchitis,   Depression    Diverticulosis    Gastritis 12/30/10   EGD Dr [name]   GERD (gastroesophageal reflux disease)  GI bleed    H. 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)    Leukocytosis    [name]   Lung nodule 07/16/2011   Myocardial infarction [facility])    age 21   Restless leg syndrome    Schatzki's ring    Sleep apnea    does not use CPAP:cannot tolerate, PCP aware    Syncope    Tobacco abuse    50 pack years continuing at one halp pack daily   Tubular adenoma of colon 06/2009   Colonosocpy Dr [name]    Past Surgical History:  Procedure Laterality Date   BIOPSY  12/19/2017   Procedure: BIOPSY;  Surgeon: [name], MD;  [facility];  Service: Endoscopy;;  gastric   CARDIAC CATHETERIZATION     CHOLECYSTECTOMY  2004   COLONOSCOPY  06/2009   normal terminal ileum, segmental mild inflammation of sigmoid colon (bx unremarkable), polyp, tubular adenoma   COLONOSCOPY N/A 07/31/2013   Dr.Rourk- redundant anal canal hemorrhoids, colonic diverticulosis, tubular adenoma   COLONOSCOPY W/ POLYPECTOMY  2004   rectal polyp with carcinoma in situ removed via colonoscopy   COLONOSCOPY WITH PROPOFOL N/A 09/15/2015   Dr. [name]: Scattered diverticula throughout the colon, 2 sessile polyps found in the descending colon and cecum, 5 mm in size.  Cecal polyp was sessile serrated polyp, descending colon polyp was a tubular adenoma.  He had a abnormal perianal exam along with grade 3 hemorrhoids.  Surveillance exam recommended for 5-year follow-up.   COLONOSCOPY WITH PROPOFOL N/A 12/19/2017   one 5 mm polyp in ascending colon and 1 cm sessile polyp in ascending s/p removal. Tubular adenoma. internal hemorrhoids   CORONARY ANGIOPLASTY WITH STENT PLACEMENT  01/26/2012   "1; total is now 2"   CORONARY ARTERY BYPASS GRAFT N/A 03/13/2021   Procedure: CORONARY ARTERY BYPASS GRAFTING (CABG), ON PUMP, TIMES THREE, USING LEFT INTERNAL MAMMARY ARTERY AND RIGHT ENDOSCOPICALLY HARVESTED GREATER SAPHENOUS VEIN;  Surgeon: Lajuana Matte, MD;  Location: [place];  Service: Open Heart Surgery;  Laterality: N/A;   CORONARY/GRAFT ACUTE MI REVASCULARIZATION N/A 03/10/2021   Procedure: Coronary/Graft Acute MI Revascularization;  Surgeon: Belva Crome, MD;  Location: [place] CV LAB;  Service: Cardiovascular;  Laterality: N/A;   ENDOVEIN HARVEST OF GREATER SAPHENOUS VEIN Right  03/13/2021   Procedure: ENDOVEIN HARVEST OF GREATER SAPHENOUS VEIN;  Surgeon: Lajuana Matte, MD;  Location: [place];  Service: Open Heart Surgery;  Laterality: Right;   ESOPHAGOGASTRODUODENOSCOPY  02/2009   query Barrett's but bx negative   ESOPHAGOGASTRODUODENOSCOPY  12/30/2010   [name],gastritis, dilated 56F, sm HH, 1 small ulcer, Duodenal erosions, benign bx   ESOPHAGOGASTRODUODENOSCOPY (EGD) WITH ESOPHAGEAL DILATION N/A 09/12/2012   [id number] Schatzki's ring s/p Maloney dilator. Small hiatal hernia. negative path   ESOPHAGOGASTRODUODENOSCOPY (EGD) WITH ESOPHAGEAL DILATION N/A 07/31/2013   Dr. [name]- normal egd, s/p [facility] dilation empirically. Normal small bowel biopsies    ESOPHAGOGASTRODUODENOSCOPY (EGD) WITH PROPOFOL N/A 09/15/2015   Dr. [name]: Medium sized hiatal hernia, normal-appearing esophagus status post empiric dilation   ESOPHAGOGASTRODUODENOSCOPY (EGD) WITH PROPOFOL N/A 12/19/2017   erosive esophagitis s/p dilation, erosive gastropathy, normal duodenum   ESOPHAGOGASTRODUODENOSCOPY (EGD) WITH PROPOFOL N/A 09/17/2019   Non-obstructing Schatzki's ring s/p dilation, medium-sized hiatal hernia, otherwise normal.   FLEXIBLE SIGMOIDOSCOPY  12/30/2010    Herbie Baltimore  M Rourk,; internal hemorrhoids, anal papilla   HAND SURGERY     surgical intervention for injury of the fingers of the left hand many years ago   heart stent     HEMORRHOID BANDING     Dr. [name]   LEFT HEART CATH AND CORONARY ANGIOGRAPHY N/A 08/29/2019   Procedure: LEFT HEART CATH AND CORONARY ANGIOGRAPHY;  Surgeon: [name], MD;  Location: [place] CV [facility];  Service: Cardiovascular;  Laterality: N/A;   LEFT HEART CATH AND CORONARY ANGIOGRAPHY N/A 03/10/2021   Procedure: LEFT HEART CATH AND CORONARY ANGIOGRAPHY;  Surgeon: Belva Crome, MD;  Location: [place] CV [facility];  Service: Cardiovascular;  Laterality: N/A;   LEFT HEART CATHETERIZATION WITH CORONARY ANGIOGRAM N/A 01/26/2012   Procedure: LEFT HEART  CATHETERIZATION WITH CORONARY ANGIOGRAM;  Surgeon: [name], MD;  Location: [facility] C[facility];  Service: Cardiovascular;  Laterality: N/A;   MALONEY DILATION N/A 09/15/2015   Procedure: [name] DILATION;  Surgeon: [name], MD;  [facility];  Service: Endoscopy;  Laterality: N/A;   MALONEY DILATION N/A 12/19/2017   Procedure: [name] DILATION;  Surgeon: [name], MD;  [facility];  Service: Endoscopy;  Laterality: N/A;   MALONEY DILATION N/A 09/17/2019   Procedure: [name] DILATION;  Surgeon: [name], MD;  [facility];  Service: Endoscopy;  Laterality: N/A;   NASAL SEPTOPLASTY W/ TURBINOPLASTY  10/06/2011   Procedure: NASAL SEPTOPLASTY WITH TURBINATE REDUCTION;  Surgeon: Izora Gala, MD;  Location: [place];  Service: ENT;  Laterality: Bilateral;   PERCUTANEOUS CORONARY STENT INTERVENTION (PCI-S) N/A 01/26/2012   Procedure: PERCUTANEOUS CORONARY STENT INTERVENTION (PCI-S);  Surgeon: [name], MD;  Location: [facility] CATH LAB;  Service: Cardiovascular;  Laterality: N/A;   POLYPECTOMY  09/15/2015   Procedure: POLYPECTOMY;  Surgeon: [name], MD;  [facility];  Service: Endoscopy;;  Cecal polyp removed via cold snare/ Descending colon polyp removed via cold snare   POLYPECTOMY  12/19/2017   Procedure: POLYPECTOMY;  Surgeon: [name], MD;  [facility];  Service: Endoscopy;;  colon   TEE WITHOUT CARDIOVERSION N/A 03/13/2021   Procedure: TRANSESOPHAGEAL ECHOCARDIOGRAM (TEE);  Surgeon: Lajuana Matte, MD;  Location: [place];  Service: Open Heart Surgery;  Laterality: N/A;    Current Outpatient Medications  Medication Sig Dispense Refill   albuterol (VENTOLIN HFA) 108 (90 Base) MCG/ACT inhaler Inhale 2 puffs into the lungs every 4 (four) hours as needed for wheezing or shortness of breath. 18 g 1   ALPRAZolam (XANAX) 1 MG tablet TAKE 1 TABLET BY MOUTH THREE TIMES A DAY AS NEEDED FOR ANXIETY (Patient taking differently:  Take 1 mg by mouth 3 (three) times daily as needed for anxiety.) 90 tablet 1   amLODipine (NORVASC) 5 MG tablet Take 5 mg by mouth daily.     aspirin EC 81 MG tablet Take 81 mg by mouth daily.     atorvastatin (LIPITOR) 80 MG tablet TAKE 1 TABLET BY MOUTH EVERY DAY (Patient taking differently: Take 80 mg by mouth daily.) 90 tablet 3   clopidogrel (PLAVIX) 75 MG tablet Take 1 tablet (75 mg total) by mouth daily. 30 tablet 11   CREON 36000-114000 units CPEP capsule TAKE 2 CAPSULES BY MOUTH THREE TIMES DAILY WITH MEALS. 1 CAPSULE WITH SNACKS (Patient taking differently: Take 36,000-72,000 Units by mouth See admin instructions. 72,000 units (2 capsules) three times daily with meals and 36,000 units (1 capsule) with snacks)  240 capsule 3   cyanocobalamin 2000 MCG tablet Take 2,000 mcg by mouth daily.     dicyclomine (BENTYL) 10 MG capsule TAKE 1 CAPSULE (10 MG TOTAL) BY MOUTH 4 (FOUR) TIMES DAILY - BEFORE MEALS AND AT BEDTIME. (Patient taking differently: Take 10 mg by mouth 4 (four) times daily -  before meals and at bedtime.) 360 capsule 1   FLUoxetine (PROZAC) 20 MG capsule Take 1 capsule (20 mg total) by mouth daily. (Patient taking differently: Take 20 mg by mouth daily. Take with 40mg  for a total daily dose of 60mg ) 90 capsule 2   FLUoxetine (PROZAC) 40 MG capsule Take 1 capsule (40 mg total) by mouth daily. Take with 20 mg to equal 60 mg daily (Patient taking differently: Take 40 mg by mouth daily. Take with 20mg  for a total daily dose of 60mg ) 90 capsule 2   gabapentin (NEURONTIN) 300 MG capsule Take 2 capsules (600 mg total) by mouth 2 (two) times daily. 480 capsule 2   metoprolol succinate (TOPROL-XL) 50 MG 24 hr tablet TAKE 1 TABLET (50 MG TOTAL) BY MOUTH DAILY. PLEASE SCHEDULE FOLLOW UP APPOINTMENT. THANK YOU 90 tablet 1   naloxone (NARCAN) nasal spray 4 mg/0.1 mL Use as directed (Patient taking differently: Place 0.4 mg into the nose as needed (opiod reversal).) 1 each 0   nicotine (NICODERM CQ  - DOSED IN MG/24 HOURS) 14 mg/24hr patch Place 1 patch (14 mg total) onto the skin daily.     nitroGLYCERIN (NITROSTAT) 0.4 MG SL tablet Place 1 tablet (0.4 mg total) under the tongue every 5 (five) minutes as needed for chest pain. 20 tablet 5   OLANZapine (ZYPREXA) 10 MG tablet Take 1 tablet (10 mg total) by mouth at bedtime. 90 tablet 2   pantoprazole (PROTONIX) 40 MG tablet TAKE 1 TABLET BY MOUTH TWICE A DAY BEFORE A MEAL (Patient taking differently: Take 40 mg by mouth 2 (two) times daily before a meal.) 180 tablet 3   rOPINIRole (REQUIP) 2 MG tablet TAKE 1 TABLET BY MOUTH EVERYDAY AT BEDTIME 90 tablet 3   tamsulosin (FLOMAX) 0.4 MG CAPS capsule Take 1 capsule (0.4 mg total) by mouth daily. 30 capsule 1   traZODone (DESYREL) 50 MG tablet TAKE 1 TABLET BY MOUTH EVERYDAY AT BEDTIME (Patient taking differently: Take 50 mg by mouth at bedtime.) 90 tablet 1   No current facility-administered medications for this visit.   Allergies:  Patient has no known allergies.   Social History: The patient  reports that he quit smoking about 2 weeks ago. His smoking use included cigarettes. He started smoking about 51 years ago. He has a 48.00 pack-year smoking history. He has quit using smokeless tobacco. He reports current alcohol use. He reports that he does not use drugs.   Family History: The patient's family history includes Cancer in his maternal aunt and paternal uncle; Depression in his mother; Heart disease in his mother; Hypertension in his brother; Kidney failure in his maternal uncle; Lung disease in his father.   ROS:  Please see the history of present illness. Otherwise, complete review of systems is positive for none.  All other systems are reviewed and negative.   Physical Exam: VS:  BP 110/80   Pulse 64   Ht 5' 7.5" (1.715 m)   Wt 148 lb (67.1 kg)   SpO2 97%   BMI 22.84 kg/m , BMI Body mass index is 22.84 kg/m.  Wt Readings from Last 3 Encounters:  04/03/21 148 lb (  67.1 kg)   03/19/21 135 lb 8 oz (61.5 kg)  02/27/21 151 lb 6.4 oz (68.7 kg)    General: Patient appears comfortable at rest. Neck: Supple, no elevated JVP or carotid bruits, no thyromegaly. Lungs: Clear to auscultation, nonlabored breathing at rest. Cardiac: Regular rate and rhythm, no S3 or significant systolic murmur, no pericardial rub. Extremities: No pitting edema, distal pulses 2+. Skin: Warm and dry. Musculoskeletal: No kyphosis. Neuropsychiatric: Alert and oriented x3, affect grossly appropriate.  ECG: December 17, 2020 normal sinus rhythm rate of 81, moderate voltage criteria for LVH, may be normal variant  Recent Labwork: 03/11/2021: ALT 40; AST 57; B Natriuretic Peptide 341.6; TSH 1.154 03/14/2021: Magnesium 2.1 03/17/2021: Hemoglobin 13.9; Platelets 210 03/19/2021: BUN 22; Creatinine, Ser 0.65; Potassium 3.8; Sodium 136     Component Value Date/Time   CHOL 85 03/11/2021 0109   CHOL 138 12/13/2017 1603   TRIG 82 03/11/2021 0109   HDL 42 03/11/2021 0109   HDL 34 (L) 12/13/2017 1603   CHOLHDL 2.0 03/11/2021 0109   VLDL 16 03/11/2021 0109   LDLCALC 27 03/11/2021 0109   LDLCALC 43 12/13/2017 1603   LDLDIRECT 51.0 08/29/2019 0236    Other Studies Reviewed Today:    Coronary/Graft Acute MI Revascularization  LEFT HEART CATH AND CORONARY ANGIOGRAPHY  03/11/2021 Conclusion  CONCLUSIONS: Acute coronary syndrome with ostial 99% of circumflex progression.  First marginal 70%. 40 to 50% distal left main 40 to 50% mid LAD with 40% first diagonal Right coronary with diffuse 30 to 40% in-stent restenosis proximal and eccentric 50 to 60% mid to distal stenosis. LV systolic dysfunction with regional wall motion abnormality, and elevated LVEDP of 22 mmHg.  Findings are consistent with acute on chronic combined systolic and diastolic heart failure.   RECOMMENDATIONS:   IV Aggrastat IV heparin IV nitro Notify Dr. Kipp Brood of patient's presence and requested consideration of surgical  revascularization.  Could also discuss other treatment options which could include circumflex stent with extension into the left main however it would gel the LAD.  Left main stenting would not be an optimal long-term strategy for this patient given his age of 32. Was not loaded with P2 Y 12 agent.  Diagnostic Dominance: Co-dominant   Echocardiogram 03/11/2021  1. Inferior and septal hypokinesis . Left ventricular ejection fraction,  by estimation, is 45 to 50%. The left ventricle has mildly decreased  function. The left ventricle has no regional wall motion abnormalities.  The left ventricular internal cavity  size was mildly dilated. Left ventricular diastolic parameters were  normal. The average left ventricular global longitudinal strain is -12.2  %. The global longitudinal strain is abnormal.   2. Right ventricular systolic function is normal. The right ventricular  size is normal.   3. Left atrial size was mildly dilated.   4. The mitral valve is abnormal. Mild to moderate mitral valve  regurgitation. No evidence of mitral stenosis.   5. The aortic valve is tricuspid. Aortic valve regurgitation is mild.  Mild aortic valve sclerosis is present, with no evidence of aortic valve  stenosis.   6. The inferior vena cava is normal in size with greater than 50%  respiratory variability, suggesting right atrial pressure of 3 mmHg.    Carotid artery duplex study 03/11/2021 Right Carotid: Velocities in the right ICA are consistent with a 1-39% stenosis. Left Carotid: Velocities in the left ICA are consistent with a 1-39% stenosis. Right Upper Extremity: Doppler waveforms decrease 50% with right radial compression. Doppler  waveforms remain within normal limits with right ulnar compression. Left Upper Extremity: Doppler waveforms remain within normal limits with left radial compression. Doppler waveforms remain within normal limits with left ulnar compression.   Echocardiogram 12/18/2020 1.  LVEF is approximately 55 to 60% wiaht hypokeinsis /akeinsis of the inferolateral wall (base, mid), inferor wall (base, mid). Compared to reprot from echo in 2017, no significant change.. Left ventricular ejection fraction, by estimation, is 55 to 60%. The left ventricle has normal function. Left ventricular diastolic parameters were normal. 2. Right ventricular systolic function is normal. The right ventricular size is normal. 3. The mitral valve is normal in structure. Mild mitral valve regurgitation. 4. The aortic valve is tricuspid. Aortic valve regurgitation is trivial. Mild aortic valve sclerosis is present, with no evidence of aortic valve stenosis. Comparison(s): Echocardiogram done 03/24/16 showed an EF of 50%.   Cardiac Catheterization: 08/2019 Previously placed Prox RCA stent (bare-metal stent) is widely patent. 2nd Mrg lesion is 100% stenosed. Previously documented is 80%. Ost Cx to Prox Cx lesion is 45% stenosed. Prox Cx to Mid Cx lesion is 20% stenosed with 20% stenosed side branch in 1st Mrg. -------------------- The left ventricular ejection fraction is 50-55% by visual estimate. LV end diastolic pressure is normal. There is no aortic valve stenosis. 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   SUMMARY Two-vessel CAD with widely patent RCA stent (at most codominant RCA), now totally occluded small caliber OM 2 branch previously noted to be 80%.  Mild to moderate (45%) ostial LCx disease. Relatively preserved EF of roughly 55% with basal to mid inferior hypokinesis.     RECOMMENDATIONS Optimization of medical management.  The OM branch not likely good PCI target as it is not even visible where it comes off the main circumflex. Anticipate discharge home either today if stable or to  Assessment and Plan:  1. S/P CABG x 3   2. CAD in native artery   3. Chronic obstructive pulmonary disease, unspecified COPD type ([place])   4.  Essential hypertension   5. Hyperlipidemia LDL goal <70   6. Tobacco abuse   7. SOB (shortness of breath)      1. CAD in native artery/STEMI/status post CABG x3 He is status post STEMI with three-vessel bypass by Dr. Kipp Brood. LIMA-LAD, SVG-diagonal, SVG-OM.  03/13/2021..  Currently denies any anginal symptoms.  Median sternotomy scar clean and dry, right venous harvesting site clean and dry.  Continue aspirin 81 mg daily, Plavix 75 mg daily, sublingual nitroglycerin as needed, Toprol-XL 50 mg p.o. daily.  He states he has a follow-up with surgeon on November 11 at 10 AM.   2. Chronic obstructive pulmonary disease, unspecified COPD type ([place]) Patient states he has not smoked since he was discharged from the hospital 7 days ago.  He states he has no desire to restart smoking.  Denies any recent DOE or SOB.  3. Essential hypertension Blood pressure well controlled today.  BP 110/80.  Continue Toprol XL 50 mg daily.  Continue amlodipine 5 mg p.o. daily  4. Hyperlipidemia LDL goal <70 History of hyperlipidemia.  Continue 80 mg of atorvastatin.  Recent lipid panel on 12/18/2020 demonstrated TC of 111, TG 83, HDL 48, LDL 54.  5. Cigarette smoker States he has not smoked for 17 days and has no plans to start back.  6. SOB (shortness of breath) At last visit we referred him to pulmonology clinic.  It appears he has  not seen pulmonology yet.  He has an upcoming appointment with Dr. [name] pulmonology on October 26  7. Pain of lower extremity, unspecified laterality Complaining of bilateral leg pain worse when walking and relieved at rest.  Recent ABIs were normal  8. Medication management    Medication Adjustments/Labs and Tests Ordered: Current medicines are reviewed at length with the patient today.  Concerns regarding medicines are outlined above.   Disposition: Follow-up with Dr. Harl Bowie or APP 6 months  Signed, Levell July, NP 04/03/2021 10:53 AM    [facility]  at [place], [place], [place] [address] Phone: [phone]; Fax: [phone]

## 2021-04-03 ENCOUNTER — Other Ambulatory Visit: Payer: Self-pay

## 2021-04-03 ENCOUNTER — Ambulatory Visit (INDEPENDENT_AMBULATORY_CARE_PROVIDER_SITE_OTHER): Payer: Medicare PPO | Admitting: Family Medicine

## 2021-04-03 ENCOUNTER — Encounter: Payer: Self-pay | Admitting: Family Medicine

## 2021-04-03 VITALS — BP 110/80 | HR 64 | Ht 67.5 in | Wt 148.0 lb

## 2021-04-03 DIAGNOSIS — Z951 Presence of aortocoronary bypass graft: Secondary | ICD-10-CM | POA: Diagnosis not present

## 2021-04-03 DIAGNOSIS — E785 Hyperlipidemia, unspecified: Secondary | ICD-10-CM | POA: Diagnosis not present

## 2021-04-03 DIAGNOSIS — R0602 Shortness of breath: Secondary | ICD-10-CM

## 2021-04-03 DIAGNOSIS — I10 Essential (primary) hypertension: Secondary | ICD-10-CM

## 2021-04-03 DIAGNOSIS — Z720 Tobacco use: Secondary | ICD-10-CM

## 2021-04-03 DIAGNOSIS — I2510 Atherosclerotic heart disease of native coronary artery without angina pectoris: Secondary | ICD-10-CM

## 2021-04-03 DIAGNOSIS — J449 Chronic obstructive pulmonary disease, unspecified: Secondary | ICD-10-CM

## 2021-04-03 NOTE — Patient Instructions (Addendum)
Medication Instructions:  Your physician recommends that you continue on your current medications as directed. Please refer to the Current Medication list given to you today.  Labwork: none  Testing/Procedures: none  Follow-Up: Your physician recommends that you schedule a follow-up appointment in: 6 months with Dr. Branch  Any Other Special Instructions Will Be Listed Below (If Applicable).  If you need a refill on your cardiac medications before your next appointment, please call your pharmacy. 

## 2021-04-07 ENCOUNTER — Other Ambulatory Visit (HOSPITAL_COMMUNITY): Payer: Medicare PPO

## 2021-04-07 ENCOUNTER — Ambulatory Visit: Payer: Medicare PPO | Admitting: Family Medicine

## 2021-04-08 ENCOUNTER — Ambulatory Visit: Payer: Medicare PPO | Admitting: Pulmonary Disease

## 2021-04-09 ENCOUNTER — Encounter (HOSPITAL_COMMUNITY): Payer: Self-pay

## 2021-04-09 ENCOUNTER — Ambulatory Visit (HOSPITAL_COMMUNITY): Admit: 2021-04-09 | Payer: Medicare PPO | Admitting: Internal Medicine

## 2021-04-09 SURGERY — COLONOSCOPY WITH PROPOFOL
Anesthesia: Monitor Anesthesia Care

## 2021-04-09 NOTE — Telephone Encounter (Signed)
Please schedule appt

## 2021-04-14 ENCOUNTER — Other Ambulatory Visit: Payer: Self-pay | Admitting: Physician Assistant

## 2021-04-21 ENCOUNTER — Other Ambulatory Visit: Payer: Self-pay | Admitting: Thoracic Surgery (Cardiothoracic Vascular Surgery)

## 2021-04-21 DIAGNOSIS — Z951 Presence of aortocoronary bypass graft: Secondary | ICD-10-CM

## 2021-04-22 ENCOUNTER — Other Ambulatory Visit: Payer: Self-pay | Admitting: Thoracic Surgery (Cardiothoracic Vascular Surgery)

## 2021-04-22 DIAGNOSIS — Z951 Presence of aortocoronary bypass graft: Secondary | ICD-10-CM

## 2021-04-23 ENCOUNTER — Ambulatory Visit
Admission: RE | Admit: 2021-04-23 | Discharge: 2021-04-23 | Disposition: A | Discharge status: Home / Self Care | Payer: Medicare PPO | Source: Ambulatory Visit | Attending: Thoracic Surgery (Cardiothoracic Vascular Surgery) | Admitting: Thoracic Surgery (Cardiothoracic Vascular Surgery)

## 2021-04-23 ENCOUNTER — Other Ambulatory Visit (HOSPITAL_COMMUNITY): Payer: Self-pay | Admitting: Psychiatry

## 2021-04-23 ENCOUNTER — Other Ambulatory Visit: Payer: Self-pay

## 2021-04-23 ENCOUNTER — Ambulatory Visit (INDEPENDENT_AMBULATORY_CARE_PROVIDER_SITE_OTHER): Payer: Self-pay | Admitting: Surgical

## 2021-04-23 VITALS — BP 90/60 | HR 67 | Resp 20 | Ht 67.5 in | Wt 150.0 lb

## 2021-04-23 DIAGNOSIS — J90 Pleural effusion, not elsewhere classified: Secondary | ICD-10-CM | POA: Diagnosis not present

## 2021-04-23 DIAGNOSIS — Z951 Presence of aortocoronary bypass graft: Secondary | ICD-10-CM

## 2021-04-23 DIAGNOSIS — J9811 Atelectasis: Secondary | ICD-10-CM | POA: Diagnosis not present

## 2021-04-23 DIAGNOSIS — Z9889 Other specified postprocedural states: Secondary | ICD-10-CM | POA: Diagnosis not present

## 2021-04-23 NOTE — Progress Notes (Signed)
[address]       [place] [address]             [phone]                03/13/2021 Patient:  [name] Pre-Op Dx: NSTEMI LM CAD   HTN   Post-op Dx:  same Procedure: CABG X 3.  LIMA LAD, RSVG Diagonal, OM   Endoscopic greater saphenous vein harvest on the right     Surgeon and Role:      * Lightfoot, Lucile Crater, MD - Primary    Evonnie Pat, PA-C - assisting Assistant: Leretha Pol, PA-C                 HPI: Patient returns for routine postoperative follow-up having undergone CABG x 3 by Dr. Kipp Brood on 03/13/21. The patient's early postoperative recovery while in the hospital was notable for unexpected mental status changes prompting code Stroke evaluation. He ruled out for stroke and was felt to be having withdrawal symptoms fom his usual Xanax dosing. Mental status returned to baseline and he otherwise made a progressive recovery.  Since hospital discharge the patient reports he has stopped smoking. He has continued to gain strength and endurance.  He does have some sternal pain but this is fairly well managed with his chronic hydrocodone.  He is on a pain contract for chronic pain issues.  He also continues to take his Xanax as previously.  He is on disability so he does not have to do any heavy lifting or exertion.  He denies shortness of breath or anginal equivalents.  He does state he has a rare palpitations that lasts just a few seconds and dissipates quickly.  He is not having any lower extremity edema.  His incisions are healing without difficulties although he does feel a slight knot in the proximal portion of the EVH tunnel.  He has had no drainage or cellulitis.  Overall he appears to be pleased with his progress but he knows that he is not fully recovered.           Current Outpatient Medications  Medication Sig Dispense Refill   albuterol (VENTOLIN HFA) 108 (90 Base) MCG/ACT inhaler Inhale 2 puffs into the lungs every 4 (four) hours as needed for  wheezing or shortness of breath. 18 g 1   ALPRAZolam (XANAX) 1 MG tablet TAKE 1 TABLET BY MOUTH THREE TIMES A DAY AS NEEDED FOR ANXIETY (Patient taking differently: Take 1 mg by mouth 3 (three) times daily as needed for anxiety.) 90 tablet 1   amLODipine (NORVASC) 5 MG tablet Take 5 mg by mouth daily.       aspirin EC 81 MG tablet Take 81 mg by mouth daily.       atorvastatin (LIPITOR) 80 MG tablet TAKE 1 TABLET BY MOUTH EVERY DAY (Patient taking differently: Take 80 mg by mouth daily.) 90 tablet 3   clopidogrel (PLAVIX) 75 MG tablet Take 1 tablet (75 mg total) by mouth daily. 30 tablet 11   CREON 36000-114000 units CPEP capsule TAKE 2 CAPSULES BY MOUTH THREE TIMES DAILY WITH MEALS. 1 CAPSULE WITH SNACKS (Patient taking differently: Take 36,000-72,000 Units by mouth See admin instructions. 72,000 units (2 capsules) three times daily with meals and 36,000 units (1 capsule) with snacks) 240 capsule 3   cyanocobalamin 2000 MCG tablet Take 2,000 mcg by mouth daily.       dicyclomine (BENTYL) 10 MG capsule TAKE 1 CAPSULE (  10 MG TOTAL) BY MOUTH 4 (FOUR) TIMES DAILY - BEFORE MEALS AND AT BEDTIME. (Patient taking differently: Take 10 mg by mouth 4 (four) times daily -  before meals and at bedtime.) 360 capsule 1   FLUoxetine (PROZAC) 20 MG capsule Take 1 capsule (20 mg total) by mouth daily. (Patient taking differently: Take 20 mg by mouth daily. Take with 40mg  for a total daily dose of 60mg ) 90 capsule 2   FLUoxetine (PROZAC) 40 MG capsule Take 1 capsule (40 mg total) by mouth daily. Take with 20 mg to equal 60 mg daily (Patient taking differently: Take 40 mg by mouth daily. Take with 20mg  for a total daily dose of 60mg ) 90 capsule 2   gabapentin (NEURONTIN) 300 MG capsule Take 2 capsules (600 mg total) by mouth 2 (two) times daily. 480 capsule 2   metoprolol succinate (TOPROL-XL) 50 MG 24 hr tablet TAKE 1 TABLET (50 MG TOTAL) BY MOUTH DAILY. PLEASE SCHEDULE FOLLOW UP APPOINTMENT. THANK YOU 90 tablet 1    naloxone (NARCAN) nasal spray 4 mg/0.1 mL Use as directed (Patient taking differently: Place 0.4 mg into the nose as needed (opiod reversal).) 1 each 0   nicotine (NICODERM CQ - DOSED IN MG/24 HOURS) 14 mg/24hr patch Place 1 patch (14 mg total) onto the skin daily.       nitroGLYCERIN (NITROSTAT) 0.4 MG SL tablet Place 1 tablet (0.4 mg total) under the tongue every 5 (five) minutes as needed for chest pain. 20 tablet 5   OLANZapine (ZYPREXA) 10 MG tablet Take 1 tablet (10 mg total) by mouth at bedtime. 90 tablet 2   pantoprazole (PROTONIX) 40 MG tablet TAKE 1 TABLET BY MOUTH TWICE A DAY BEFORE A MEAL (Patient taking differently: Take 40 mg by mouth 2 (two) times daily before a meal.) 180 tablet 3   rOPINIRole (REQUIP) 2 MG tablet TAKE 1 TABLET BY MOUTH EVERYDAY AT BEDTIME 90 tablet 3   tamsulosin (FLOMAX) 0.4 MG CAPS capsule Take 1 capsule (0.4 mg total) by mouth daily. 30 capsule 1   traZODone (DESYREL) 50 MG tablet TAKE 1 TABLET BY MOUTH EVERYDAY AT BEDTIME (Patient taking differently: Take 50 mg by mouth at bedtime.) 90 tablet 1    No current facility-administered medications for this visit.      Physical Exam: General: Somewhat chronically ill-appearing, no acute distress Lungs: Clear all fields except minimal diminished in the left base Cardiac: Regular rate and rhythm without M, G, R Extremities: No edema Incisions: Well-healed without evidence of infection     Diagnostic Tests: DG Chest 2 View   Result Date: 04/23/2021 CLINICAL DATA:  s/p cabg EXAM: CHEST - 2 VIEW COMPARISON:  Chest radiograph 03/19/2021 FINDINGS: Unchanged cardiomediastinal silhouette. Prior CABG. Intact sternotomy wires. There is a new small left pleural effusion and left basilar atelectasis. There is no visible pneumothorax. There is no acute osseous abnormality. IMPRESSION: Postsurgical changes of CABG. New small left pleural effusion with adjacent basilar atelectasis. Electronically Signed   By: Maurine Simmering M.D.    On: 04/23/2021 13:36      Impression / Plan: The patient is doing well.  I encouraged him to continue his smoking cessation and he appears somewhat determined not to smoke anymore.  We discussed sternal precautions, activity progression including driving.  I did not make any changes to his current medication regimen.  He has been seen and followed by cardiology and will continue to do so.  He remains on aspirin and Plavix.  We  will see the patient again on a as needed basis for any surgically related issues or at request.     John Giovanni, PA-C  [facility] [phone]

## 2021-04-23 NOTE — Progress Notes (Signed)
[address]       [place] [address]             [phone]     03/13/2021 Patient:  [name] Pre-Op Dx: NSTEMI LM CAD   HTN   Post-op Dx:  same Procedure: CABG X 3.  LIMA LAD, RSVG Diagonal, OM   Endoscopic greater saphenous vein harvest on the right     Surgeon and Role:      * Lightfoot, Lucile Crater, MD - Primary    Evonnie Pat, PA-C - assisting Assistant: Leretha Pol, PA-C     HPI: Patient returns for routine postoperative follow-up having undergone CABG x 3 by Dr. Kipp Brood on 03/13/21. The patient's early postoperative recovery while in the hospital was notable for unexpected mental status changes prompting code Stroke evaluation. He ruled out for stroke and was felt to be having withdrawal symptoms fom his usual Xanax dosing. Mental status returned to baseline and he otherwise made a progressive recovery.  Since hospital discharge the patient reports he has stopped smoking. He has continued to gain strength and endurance.  He does have some sternal pain but this is fairly well managed with his chronic hydrocodone.  He is on a pain contract for chronic pain issues.  He also continues to take his Xanax as previously.  He is on disability so he does not have to do any heavy lifting or exertion.  He denies shortness of breath or anginal equivalents.  He does state he has a rare palpitations that lasts just a few seconds and dissipates quickly.  He is not having any lower extremity edema.  His incisions are healing without difficulties although he does feel a slight knot in the proximal portion of the EVH tunnel.  He has had no drainage or cellulitis.  Overall he appears to be pleased with his progress but he knows that he is not fully recovered.   Current Outpatient Medications  Medication Sig Dispense Refill   albuterol (VENTOLIN HFA) 108 (90 Base) MCG/ACT inhaler Inhale 2 puffs into the lungs every 4 (four) hours as needed for wheezing or shortness of  breath. 18 g 1   ALPRAZolam (XANAX) 1 MG tablet TAKE 1 TABLET BY MOUTH THREE TIMES A DAY AS NEEDED FOR ANXIETY (Patient taking differently: Take 1 mg by mouth 3 (three) times daily as needed for anxiety.) 90 tablet 1   amLODipine (NORVASC) 5 MG tablet Take 5 mg by mouth daily.     aspirin EC 81 MG tablet Take 81 mg by mouth daily.     atorvastatin (LIPITOR) 80 MG tablet TAKE 1 TABLET BY MOUTH EVERY DAY (Patient taking differently: Take 80 mg by mouth daily.) 90 tablet 3   clopidogrel (PLAVIX) 75 MG tablet Take 1 tablet (75 mg total) by mouth daily. 30 tablet 11   CREON 36000-114000 units CPEP capsule TAKE 2 CAPSULES BY MOUTH THREE TIMES DAILY WITH MEALS. 1 CAPSULE WITH SNACKS (Patient taking differently: Take 36,000-72,000 Units by mouth See admin instructions. 72,000 units (2 capsules) three times daily with meals and 36,000 units (1 capsule) with snacks) 240 capsule 3   cyanocobalamin 2000 MCG tablet Take 2,000 mcg by mouth daily.     dicyclomine (BENTYL) 10 MG capsule TAKE 1 CAPSULE (10 MG TOTAL) BY MOUTH 4 (FOUR) TIMES DAILY - BEFORE MEALS AND AT BEDTIME. (Patient taking differently: Take 10 mg by mouth 4 (four) times daily -  before meals and at  bedtime.) 360 capsule 1   FLUoxetine (PROZAC) 20 MG capsule Take 1 capsule (20 mg total) by mouth daily. (Patient taking differently: Take 20 mg by mouth daily. Take with 40mg  for a total daily dose of 60mg ) 90 capsule 2   FLUoxetine (PROZAC) 40 MG capsule Take 1 capsule (40 mg total) by mouth daily. Take with 20 mg to equal 60 mg daily (Patient taking differently: Take 40 mg by mouth daily. Take with 20mg  for a total daily dose of 60mg ) 90 capsule 2   gabapentin (NEURONTIN) 300 MG capsule Take 2 capsules (600 mg total) by mouth 2 (two) times daily. 480 capsule 2   metoprolol succinate (TOPROL-XL) 50 MG 24 hr tablet TAKE 1 TABLET (50 MG TOTAL) BY MOUTH DAILY. PLEASE SCHEDULE FOLLOW UP APPOINTMENT. THANK YOU 90 tablet 1   naloxone (NARCAN) nasal spray 4  mg/0.1 mL Use as directed (Patient taking differently: Place 0.4 mg into the nose as needed (opiod reversal).) 1 each 0   nicotine (NICODERM CQ - DOSED IN MG/24 HOURS) 14 mg/24hr patch Place 1 patch (14 mg total) onto the skin daily.     nitroGLYCERIN (NITROSTAT) 0.4 MG SL tablet Place 1 tablet (0.4 mg total) under the tongue every 5 (five) minutes as needed for chest pain. 20 tablet 5   OLANZapine (ZYPREXA) 10 MG tablet Take 1 tablet (10 mg total) by mouth at bedtime. 90 tablet 2   pantoprazole (PROTONIX) 40 MG tablet TAKE 1 TABLET BY MOUTH TWICE A DAY BEFORE A MEAL (Patient taking differently: Take 40 mg by mouth 2 (two) times daily before a meal.) 180 tablet 3   rOPINIRole (REQUIP) 2 MG tablet TAKE 1 TABLET BY MOUTH EVERYDAY AT BEDTIME 90 tablet 3   tamsulosin (FLOMAX) 0.4 MG CAPS capsule Take 1 capsule (0.4 mg total) by mouth daily. 30 capsule 1   traZODone (DESYREL) 50 MG tablet TAKE 1 TABLET BY MOUTH EVERYDAY AT BEDTIME (Patient taking differently: Take 50 mg by mouth at bedtime.) 90 tablet 1   No current facility-administered medications for this visit.    Physical Exam: General: Somewhat chronically ill-appearing, no acute distress Lungs: Clear all fields except minimal diminished in the left base Cardiac: Regular rate and rhythm without M, G, R Extremities: No edema Incisions: Well-healed without evidence of infection   Diagnostic Tests: DG Chest 2 View  Result Date: 04/23/2021 CLINICAL DATA:  s/p cabg EXAM: CHEST - 2 VIEW COMPARISON:  Chest radiograph 03/19/2021 FINDINGS: Unchanged cardiomediastinal silhouette. Prior CABG. Intact sternotomy wires. There is a new small left pleural effusion and left basilar atelectasis. There is no visible pneumothorax. There is no acute osseous abnormality. IMPRESSION: Postsurgical changes of CABG. New small left pleural effusion with adjacent basilar atelectasis. Electronically Signed   By: Maurine Simmering M.D.   On: 04/23/2021 13:36     Impression /  Plan: The patient is doing well.  I encouraged him to continue his smoking cessation and he appears somewhat determined not to smoke anymore.  We discussed sternal precautions, activity progression including driving.  I did not make any changes to his current medication regimen.  He has been seen and followed by cardiology and will continue to do so.  He remains on aspirin and Plavix.  We will see the patient again on a as needed basis for any surgically related issues or at request.   John Giovanni, PA-C  [facility] [phone]

## 2021-04-23 NOTE — Patient Instructions (Signed)
Activity and lifting precautions, driving progression

## 2021-04-24 ENCOUNTER — Encounter: Payer: Medicare PPO | Admitting: Thoracic Surgery (Cardiothoracic Vascular Surgery)

## 2021-05-08 ENCOUNTER — Other Ambulatory Visit (HOSPITAL_COMMUNITY): Payer: Self-pay | Admitting: Psychiatry

## 2021-05-11 NOTE — Telephone Encounter (Signed)
Call for appt

## 2021-05-25 ENCOUNTER — Other Ambulatory Visit (HOSPITAL_COMMUNITY): Payer: Self-pay | Admitting: Psychiatry

## 2021-05-25 NOTE — Telephone Encounter (Signed)
Call for appt

## 2021-06-04 ENCOUNTER — Other Ambulatory Visit: Payer: Self-pay | Admitting: Physician Assistant

## 2021-06-05 ENCOUNTER — Ambulatory Visit: Payer: Medicare PPO | Admitting: Pulmonary Disease

## 2021-06-13 DIAGNOSIS — F329 Major depressive disorder, single episode, unspecified: Secondary | ICD-10-CM | POA: Diagnosis not present

## 2021-06-13 DIAGNOSIS — E8809 Other disorders of plasma-protein metabolism, not elsewhere classified: Secondary | ICD-10-CM | POA: Diagnosis not present

## 2021-06-13 DIAGNOSIS — G4700 Insomnia, unspecified: Secondary | ICD-10-CM | POA: Diagnosis not present

## 2021-06-13 DIAGNOSIS — Z9181 History of falling: Secondary | ICD-10-CM | POA: Diagnosis not present

## 2021-06-13 DIAGNOSIS — G2581 Restless legs syndrome: Secondary | ICD-10-CM | POA: Diagnosis not present

## 2021-06-13 DIAGNOSIS — Z955 Presence of coronary angioplasty implant and graft: Secondary | ICD-10-CM | POA: Diagnosis not present

## 2021-06-13 DIAGNOSIS — E785 Hyperlipidemia, unspecified: Secondary | ICD-10-CM | POA: Diagnosis not present

## 2021-06-13 DIAGNOSIS — I739 Peripheral vascular disease, unspecified: Secondary | ICD-10-CM | POA: Diagnosis not present

## 2021-06-13 DIAGNOSIS — F4323 Adjustment disorder with mixed anxiety and depressed mood: Secondary | ICD-10-CM | POA: Diagnosis not present

## 2021-06-13 DIAGNOSIS — I25119 Atherosclerotic heart disease of native coronary artery with unspecified angina pectoris: Secondary | ICD-10-CM | POA: Diagnosis not present

## 2021-06-13 DIAGNOSIS — J439 Emphysema, unspecified: Secondary | ICD-10-CM | POA: Diagnosis not present

## 2021-06-13 DIAGNOSIS — Z95820 Peripheral vascular angioplasty status with implants and grafts: Secondary | ICD-10-CM | POA: Diagnosis not present

## 2021-06-16 ENCOUNTER — Telehealth: Payer: Self-pay | Admitting: Family Medicine

## 2021-06-16 ENCOUNTER — Other Ambulatory Visit: Payer: Self-pay | Admitting: Family Medicine

## 2021-06-16 MED ORDER — HYDROCODONE-ACETAMINOPHEN 5-325 MG PO TABS: ORAL_TABLET | ORAL | 0 refills | Status: DC

## 2021-06-16 NOTE — Telephone Encounter (Signed)
Nurses-please tell patient under current guidelines we will have to reduce the strength of his hydrocodone because he is on so much of Xanax.  It is in his best interest to taper down on the Xanax.  He should gradually cut down to a half a tablet of Xanax in the morning half a tablet midday and 1 in the evening.  He should work with Dr. Harrington Challenger to get down to the equivalent of 0.5 mg no more than 3 times per day.  I did send in pain medicine the lower dose 5 mg/325 we will not be able to do the higher dose because of the multitude of medicines that he is on.  It is important for him to keep follow-up visit.

## 2021-06-16 NOTE — Telephone Encounter (Signed)
Pt wife called and stated the pt was out of his hydrocodone and would like it filled at [facility] on [address], [place]. Spouse number [phone].

## 2021-06-17 ENCOUNTER — Other Ambulatory Visit: Payer: Self-pay | Admitting: Physician Assistant

## 2021-06-17 NOTE — Telephone Encounter (Signed)
Telephone call no answer 

## 2021-06-19 NOTE — Telephone Encounter (Signed)
Telephone call no answer 

## 2021-06-22 ENCOUNTER — Other Ambulatory Visit (HOSPITAL_COMMUNITY): Payer: Self-pay | Admitting: Psychiatry

## 2021-06-23 ENCOUNTER — Encounter (HOSPITAL_COMMUNITY): Payer: Self-pay

## 2021-06-23 NOTE — Progress Notes (Signed)
Despite multiple attempts, unable to reach patient regarding LCS. Referral closed at this time.

## 2021-06-24 ENCOUNTER — Other Ambulatory Visit (HOSPITAL_COMMUNITY): Payer: Self-pay | Admitting: Psychiatry

## 2021-06-26 NOTE — Telephone Encounter (Signed)
Pt spouse Shonna Chock) contacted and verbalized understanding. Pt recently had triple by pass and surgeon suggested that at next visit, PCP go over all meds. Spouse informed that we would go over everything at upcoming visit. Spouse verbalized understanding.

## 2021-06-29 ENCOUNTER — Other Ambulatory Visit (HOSPITAL_COMMUNITY): Payer: Self-pay | Admitting: Psychiatry

## 2021-06-30 ENCOUNTER — Telehealth (HOSPITAL_COMMUNITY): Payer: Self-pay | Admitting: *Deleted

## 2021-06-30 NOTE — Telephone Encounter (Signed)
I understand, but he has not been seen since last April. No refills until he makes appt

## 2021-06-30 NOTE — Telephone Encounter (Signed)
Patient wife called stating patient is needing refills for his Xanax.   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   Per pt wife this just through him right over.  Per pt he had his bypass Sept 27th 2022.

## 2021-06-30 NOTE — Telephone Encounter (Signed)
Informed wife and she verbalized understanding and appt is scheduled for 07/01/2021

## 2021-07-01 ENCOUNTER — Other Ambulatory Visit: Payer: Self-pay

## 2021-07-01 ENCOUNTER — Telehealth (INDEPENDENT_AMBULATORY_CARE_PROVIDER_SITE_OTHER): Payer: Medicare HMO | Admitting: Psychiatry

## 2021-07-01 ENCOUNTER — Encounter (HOSPITAL_COMMUNITY): Payer: Self-pay | Admitting: Psychiatry

## 2021-07-01 DIAGNOSIS — F5105 Insomnia due to other mental disorder: Secondary | ICD-10-CM | POA: Diagnosis not present

## 2021-07-01 DIAGNOSIS — F418 Other specified anxiety disorders: Secondary | ICD-10-CM | POA: Diagnosis not present

## 2021-07-01 MED ORDER — FLUOXETINE HCL 40 MG PO CAPS: 40.0000 mg | ORAL_CAPSULE | Freq: Every day | ORAL | 2 refills | Status: DC

## 2021-07-01 MED ORDER — ALPRAZOLAM 0.5 MG PO TABS: ORAL_TABLET | ORAL | 2 refills | Status: DC

## 2021-07-01 MED ORDER — TRAZODONE HCL 50 MG PO TABS: ORAL_TABLET | ORAL | 1 refills | Status: DC

## 2021-07-01 MED ORDER — OLANZAPINE 10 MG PO TABS: 10.0000 mg | ORAL_TABLET | Freq: Every day | ORAL | 2 refills | Status: DC

## 2021-07-01 MED ORDER — GABAPENTIN 300 MG PO CAPS: 600.0000 mg | ORAL_CAPSULE | Freq: Three times a day (TID) | ORAL | 1 refills | Status: DC

## 2021-07-01 MED ORDER — FLUOXETINE HCL 20 MG PO CAPS: 20.0000 mg | ORAL_CAPSULE | Freq: Every day | ORAL | 2 refills | Status: DC

## 2021-07-01 NOTE — Progress Notes (Signed)
Virtual Visit via Telephone Note  I connected with [name] on 07/01/21 at 11:40 AM EST by telephone and verified that I am speaking with the correct person using two identifiers.  Location: Patient: home Provider: office   I discussed the limitations, risks, security and privacy concerns of performing an evaluation and management service by telephone and the availability of in person appointments. I also discussed with the patient that there may be a patient responsible charge related to this service. The patient expressed understanding and agreed to proceed.     I discussed the assessment and treatment plan with the patient. The patient was provided an opportunity to ask questions and all were answered. The patient agreed with the plan and demonstrated an understanding of the instructions.   The patient was advised to call back or seek an in-person evaluation if the symptoms worsen or if the condition fails to improve as anticipated.  I provided 14 minutes of non-face-to-face time during this encounter.   [name], MD  [facility] MD/PA/NP OP Progress Note  07/01/2021 12:01 PM [name]  MRN:  [id number]  Chief Complaint:  Chief Complaint   Depression; Anxiety; Follow-up    HPI: This patient is a 66 year old married white male lives with his wife in a 2 grandchildren in [place].  He is on disability for coronary artery disease and COPD.  He used to work for the [employer].  The patient returns after long absence.  He was last seen in April 2022.  Since then he has had some unfortunate events.  This 3 year old stepson died suddenly of a heart attack and the patient was one who found him.  He states after this he got very depressed and stopped eating.  He himself suffered a heart attack and had to have CABG in November.  He states that he lost about 50 pounds due to not eating and feeling bad.  He is slowly trying to regain his strength.  He is starting to eat a little bit  better.  The patient denies severe depression or thoughts of self-harm or suicide.  He still thinks his medications are helpful although is starting to run out of some of them.  Dr. [name], his primary doctor wants him to try to taper off some of the Xanax since he is getting pain medication and he is agreeable to doing this.  Right now he is sleeping fairly well with the trazodone. Visit Diagnosis:    ICD-10-CM   1. Depression with anxiety  F41.8     2. 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 s/p PTCA to LCx in 1993 b. BMS to RCA in 01/2012 with residual 80% OM2 stenosis and medical management recommended c. 08/2019: cath showing patent stents with occluded OM2 --> medical management.    Asthma    CAD (coronary artery disease)    Carcinoma in situ of colon 2004   rectal polyp   Colitis, ischemic ([place]) 2011   COPD (chronic obstructive pulmonary disease) (HCC)    mild ;excercise induced hypoxemia by cp stress test ;asthma ,bronchitis,   Depression    Diverticulosis    Gastritis 12/30/10   EGD Dr [name]   GERD (gastroesophageal reflux disease)  GI bleed    H. 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)    Leukocytosis    [name]   Lung nodule 07/16/2011   Myocardial infarction [facility])    age 66   Restless leg syndrome    Schatzki's ring    Sleep apnea    does not use CPAP:cannot tolerate, PCP aware   Syncope    Tobacco abuse    50 pack years continuing at one halp pack daily   Tubular adenoma of colon 06/2009   Colonosocpy Dr [name]    Past Surgical History:  Procedure Laterality Date   BIOPSY  12/19/2017   Procedure: BIOPSY;  Surgeon:  [name], MD;  [facility];  Service: Endoscopy;;  gastric   CARDIAC CATHETERIZATION     CHOLECYSTECTOMY  2004   COLONOSCOPY  06/2009   normal terminal ileum, segmental mild inflammation of sigmoid colon (bx unremarkable), polyp, tubular adenoma   COLONOSCOPY N/A 07/31/2013   Dr.Rourk- redundant anal canal hemorrhoids, colonic diverticulosis, tubular adenoma   COLONOSCOPY W/ POLYPECTOMY  2004   rectal polyp with carcinoma in situ removed via colonoscopy   COLONOSCOPY WITH PROPOFOL N/A 09/15/2015   Dr. [name]: Scattered diverticula throughout the colon, 2 sessile polyps found in the descending colon and cecum, 5 mm in size.  Cecal polyp was sessile serrated polyp, descending colon polyp was a tubular adenoma.  He had a abnormal perianal exam along with grade 3 hemorrhoids.  Surveillance exam recommended for 5-year follow-up.   COLONOSCOPY WITH PROPOFOL N/A 12/19/2017   one 5 mm polyp in ascending colon and 1 cm sessile polyp in ascending s/p removal. Tubular adenoma. internal hemorrhoids   CORONARY ANGIOPLASTY WITH STENT PLACEMENT  01/26/2012   "1; total is now 2"   CORONARY ARTERY BYPASS GRAFT N/A 03/13/2021   Procedure: CORONARY ARTERY BYPASS GRAFTING (CABG), ON PUMP, TIMES THREE, USING LEFT INTERNAL MAMMARY ARTERY AND RIGHT ENDOSCOPICALLY HARVESTED GREATER SAPHENOUS VEIN;  Surgeon: Lajuana Matte, MD;  Location: [place];  Service: Open Heart Surgery;  Laterality: N/A;   CORONARY/GRAFT ACUTE MI REVASCULARIZATION N/A 03/10/2021   Procedure: Coronary/Graft Acute MI Revascularization;  Surgeon: Belva Crome, MD;  Location: [place] CV LAB;  Service: Cardiovascular;  Laterality: N/A;   ENDOVEIN HARVEST OF GREATER SAPHENOUS VEIN Right 03/13/2021   Procedure: ENDOVEIN HARVEST OF GREATER SAPHENOUS VEIN;  Surgeon: Lajuana Matte, MD;  Location: [place];  Service: Open Heart Surgery;  Laterality: Right;   ESOPHAGOGASTRODUODENOSCOPY  02/2009   query Barrett's but bx negative    ESOPHAGOGASTRODUODENOSCOPY  12/30/2010   [name],gastritis, dilated 22F, sm HH, 1 small ulcer, Duodenal erosions, benign bx   ESOPHAGOGASTRODUODENOSCOPY (EGD) WITH ESOPHAGEAL DILATION N/A 09/12/2012   GNF:[id number] Schatzki's ring s/p Maloney dilator. Small hiatal hernia. negative path   ESOPHAGOGASTRODUODENOSCOPY (EGD) WITH ESOPHAGEAL DILATION N/A 07/31/2013   Dr. [name]- normal egd, s/p [facility] dilation empirically. Normal small bowel biopsies    ESOPHAGOGASTRODUODENOSCOPY (EGD) WITH PROPOFOL N/A 09/15/2015   Dr. [name]: Medium sized hiatal hernia, normal-appearing esophagus status post empiric dilation   ESOPHAGOGASTRODUODENOSCOPY (EGD) WITH PROPOFOL N/A 12/19/2017   erosive esophagitis s/p dilation, erosive gastropathy, normal duodenum   ESOPHAGOGASTRODUODENOSCOPY (EGD) WITH PROPOFOL N/A 09/17/2019   Non-obstructing Schatzki's ring s/p dilation, medium-sized hiatal hernia, otherwise normal.   FLEXIBLE SIGMOIDOSCOPY  12/30/2010    Herbie Baltimore  M Rourk,; internal hemorrhoids, anal papilla   HAND SURGERY     surgical intervention for injury of the fingers of the left hand many years ago   heart stent     HEMORRHOID BANDING     Dr. [name]   LEFT HEART CATH AND CORONARY ANGIOGRAPHY N/A 08/29/2019   Procedure: LEFT HEART CATH AND CORONARY ANGIOGRAPHY;  Surgeon: [name], MD;  Location: [place] CV LAB;  Service: Cardiovascular;  Laterality: N/A;   LEFT HEART CATH AND CORONARY ANGIOGRAPHY N/A 03/10/2021   Procedure: LEFT HEART CATH AND CORONARY ANGIOGRAPHY;  Surgeon: Belva Crome, MD;  Location: [place] CV LAB;  Service: Cardiovascular;  Laterality: N/A;   LEFT HEART CATHETERIZATION WITH CORONARY ANGIOGRAM N/A 01/26/2012   Procedure: LEFT HEART CATHETERIZATION WITH CORONARY ANGIOGRAM;  Surgeon: [name], MD;  Location: [facility] CATH LAB;  Service: Cardiovascular;  Laterality: N/A;   MALONEY DILATION N/A 09/15/2015   Procedure: [name] DILATION;  Surgeon: [name], MD;  [facility];  Service: Endoscopy;  Laterality: N/A;   MALONEY DILATION N/A 12/19/2017   Procedure: [name] DILATION;  Surgeon: [name], MD;  [facility];  Service: Endoscopy;  Laterality: N/A;   MALONEY DILATION N/A 09/17/2019   Procedure: [name] DILATION;  Surgeon: [name], MD;  [facility];  Service: Endoscopy;  Laterality: N/A;   NASAL SEPTOPLASTY W/ TURBINOPLASTY  10/06/2011   Procedure: NASAL SEPTOPLASTY WITH TURBINATE REDUCTION;  Surgeon: Izora Gala, MD;  Location: [place];  Service: ENT;  Laterality: Bilateral;   PERCUTANEOUS CORONARY STENT INTERVENTION (PCI-S) N/A 01/26/2012   Procedure: PERCUTANEOUS CORONARY STENT INTERVENTION (PCI-S);  Surgeon: [name], MD;  Location: [facility] CATH LAB;  Service: Cardiovascular;  Laterality: N/A;   POLYPECTOMY  09/15/2015   Procedure: POLYPECTOMY;  Surgeon: [name], MD;  [facility];  Service: Endoscopy;;  Cecal polyp removed via cold snare/ Descending colon polyp removed via cold snare   POLYPECTOMY  12/19/2017   Procedure: POLYPECTOMY;  Surgeon: [name], MD;  [facility];  Service: Endoscopy;;  colon   TEE WITHOUT CARDIOVERSION N/A 03/13/2021   Procedure: TRANSESOPHAGEAL ECHOCARDIOGRAM (TEE);  Surgeon: Lajuana Matte, MD;  Location: [place];  Service: Open Heart Surgery;  Laterality: N/A;    Family Psychiatric History: see below  Family History:  Family History  Problem Relation Age of Onset   Lung disease Father        deceased, black lung   Heart disease Mother        blood clots   Depression Mother    Cancer Paternal Uncle        unknown type   Cancer Maternal Aunt        unknown type   Kidney failure Maternal Uncle    Hypertension Brother    Colon cancer Neg Hx    ADD / ADHD Neg Hx    Alcohol abuse Neg Hx    Drug abuse Neg Hx    Anxiety disorder Neg Hx    Bipolar disorder Neg Hx    Dementia Neg Hx    OCD Neg Hx    Paranoid behavior Neg Hx    Schizophrenia Neg  Hx    Physical abuse Neg Hx    Sexual abuse Neg Hx    Seizures Neg Hx     Social History:  Social History   Socioeconomic History   Marital status: Married    Spouse name:  Not on file   Number of children: 3   Years of education: Not on file   Highest education level: Not on file  Occupational History   Occupation: disable    Employer: RETIRED    Comment: [employer]  Tobacco Use   Smoking status: Former    Packs/day: 1.00    Years: 48.00    Pack years: 48.00    Types: Cigarettes    Start date: 07/31/1969    Quit date: 03/16/2021    Years since quitting: 0.2   Smokeless tobacco: Former  Scientific laboratory technician Use: Never used  Substance and Sexual Activity   Alcohol use: Yes    Comment: Drinks a beer occasionally   Drug use: No   Sexual activity: Not Currently  Other Topics Concern   Not on file  Social History Narrative   3 stepchildren   Social Determinants of Health   Financial Resource Strain: Not on file  Food Insecurity: Not on file  Transportation Needs: Not on file  Physical Activity: Not on file  Stress: Not on file  Social Connections: Not on file    Allergies: No Known Allergies  Metabolic Disorder Labs: Lab Results  Component Value Date   HGBA1C 4.9 03/11/2021   MPG 94 03/11/2021   MPG 103 10/29/2013   No results found for: PROLACTIN Lab Results  Component Value Date   CHOL 85 03/11/2021   TRIG 82 03/11/2021   HDL 42 03/11/2021   CHOLHDL 2.0 03/11/2021   VLDL 16 03/11/2021   LDLCALC 27 03/11/2021   LDLCALC 54 12/18/2020   Lab Results  Component Value Date   TSH 1.154 03/11/2021   TSH 1.011 02/09/2010    Therapeutic Level Labs: No results found for: LITHIUM No results found for: VALPROATE No components found for:  CBMZ  Current Medications: Current Outpatient Medications  Medication Sig Dispense Refill   ALPRAZolam (XANAX) 0.5 MG tablet Take one twice a day and two at bedtime 120 tablet 2   albuterol (VENTOLIN HFA) 108 (90 Base)  MCG/ACT inhaler Inhale 2 puffs into the lungs every 4 (four) hours as needed for wheezing or shortness of breath. 18 g 1   amLODipine (NORVASC) 5 MG tablet Take 5 mg by mouth daily.     aspirin EC 81 MG tablet Take 81 mg by mouth daily.     atorvastatin (LIPITOR) 80 MG tablet TAKE 1 TABLET BY MOUTH EVERY DAY (Patient taking differently: Take 80 mg by mouth daily.) 90 tablet 3   clopidogrel (PLAVIX) 75 MG tablet Take 1 tablet (75 mg total) by mouth daily. 30 tablet 11   CREON 36000-114000 units CPEP capsule TAKE 2 CAPSULES BY MOUTH THREE TIMES DAILY WITH MEALS. 1 CAPSULE WITH SNACKS (Patient taking differently: Take 36,000-72,000 Units by mouth See admin instructions. 72,000 units (2 capsules) three times daily with meals and 36,000 units (1 capsule) with snacks) 240 capsule 3   cyanocobalamin 2000 MCG tablet Take 2,000 mcg by mouth daily.     dicyclomine (BENTYL) 10 MG capsule TAKE 1 CAPSULE (10 MG TOTAL) BY MOUTH 4 (FOUR) TIMES DAILY - BEFORE MEALS AND AT BEDTIME. (Patient taking differently: Take 10 mg by mouth 4 (four) times daily -  before meals and at bedtime.) 360 capsule 1   FLUoxetine (PROZAC) 20 MG capsule Take 1 capsule (20 mg total) by mouth daily. Take with 40mg  for a total daily dose of 60mg  90 capsule 2   FLUoxetine (PROZAC) 40 MG capsule Take 1  capsule (40 mg total) by mouth daily. Take with 20mg  for a total daily dose of 60mg  90 capsule 2   gabapentin (NEURONTIN) 300 MG capsule TAKE 2 CAPSULES (600 MG TOTAL) BY MOUTH 3 (THREE) TIMES DAILY. 540 capsule 1   HYDROcodone-acetaminophen (NORCO/VICODIN) 5-325 MG tablet 1 twice daily as needed 30 tablet 0   metoprolol succinate (TOPROL-XL) 50 MG 24 hr tablet TAKE 1 TABLET (50 MG TOTAL) BY MOUTH DAILY. PLEASE SCHEDULE FOLLOW UP APPOINTMENT. THANK YOU 90 tablet 1   naloxone (NARCAN) nasal spray 4 mg/0.1 mL Use as directed (Patient taking differently: Place 0.4 mg into the nose as needed (opiod reversal).) 1 each 0   nicotine (NICODERM CQ - DOSED  IN MG/24 HOURS) 14 mg/24hr patch Place 1 patch (14 mg total) onto the skin daily.     nitroGLYCERIN (NITROSTAT) 0.4 MG SL tablet Place 1 tablet (0.4 mg total) under the tongue every 5 (five) minutes as needed for chest pain. 20 tablet 5   OLANZapine (ZYPREXA) 10 MG tablet Take 1 tablet (10 mg total) by mouth at bedtime. 90 tablet 2   pantoprazole (PROTONIX) 40 MG tablet TAKE 1 TABLET BY MOUTH TWICE A DAY BEFORE A MEAL (Patient taking differently: Take 40 mg by mouth 2 (two) times daily before a meal.) 180 tablet 3   rOPINIRole (REQUIP) 2 MG tablet TAKE 1 TABLET BY MOUTH EVERYDAY AT BEDTIME 90 tablet 3   tamsulosin (FLOMAX) 0.4 MG CAPS capsule Take 1 capsule (0.4 mg total) by mouth daily. 30 capsule 1   traZODone (DESYREL) 50 MG tablet TAKE 1 TABLET BY MOUTH EVERYDAY AT BEDTIME 90 tablet 1   No current facility-administered medications for this visit.     Musculoskeletal: Strength & Muscle Tone: na Gait & Station: na Patient leans: N/A  Psychiatric Specialty Exam: Review of Systems  Constitutional:  Positive for unexpected weight change.  Musculoskeletal:  Positive for arthralgias.  Neurological:  Positive for weakness.  All other systems reviewed and are negative.  There were no vitals taken for this visit.There is no height or weight on file to calculate BMI.  General Appearance: NA  Eye Contact:  NA  Speech:  Clear and Coherent  Volume:  Normal  Mood:  Euthymic  Affect:  NA  Thought Process:  Goal Directed  Orientation:  Full (Time, Place, and Person)  Thought Content: Rumination   Suicidal Thoughts:  No  Homicidal Thoughts:  No  Memory:  Immediate;   Good Recent;   Good Remote;   Fair  Judgement:  Good  Insight:  Fair  Psychomotor Activity:  Decreased  Concentration:  Concentration: Good and Attention Span: Good  Recall:  Good  Fund of Knowledge: Good  Language: Good  Akathisia:  No  Handed:  Right  AIMS (if indicated): not done  Assets:  Communication  Skills Desire for Improvement Resilience Social Support  ADL's:  Intact  Cognition: WNL  Sleep:  Good   Screenings: GAD-7    Flowsheet Row Office Visit from 01/05/2021 in [place] Office Visit from 01/16/2020 in [place] Office Visit from 03/30/2018 in [place]  Total GAD-7 Score 13 13 4       PHQ2-9    Flowsheet Row Video Visit from 07/01/2021 in [place] Office Visit from 01/05/2021 in [place] Video Visit from 10/07/2020 in [name] Office Visit from 10/06/2020 in [place] Office Visit from 01/16/2020 in [place]  PHQ-2  Total Score 1 3 1  0 5  PHQ-9 Total Score -- 11 5 2 20       Flowsheet Row Video Visit from 07/01/2021 in [place] [facility] to Hosp-Admission (Discharged) from 03/10/2021 in [facility]E CV SURGICAL PROGRESSIVE CARE Video Visit from 10/07/2020 in [place] [facility]  C-SSRS RISK CATEGORY No Risk No Risk No Risk        Assessment and Plan: This patient is a 66 year old male with a history of depression anxiety posttraumatic stress disorder relating to his daughters suicide and now his son's recent sudden death.  He seems to be doing okay since his surgery and is slowly improving.  He will continue trazodone 50 mg at bedtime for sleep, daily for anxiety, gabapentin 600 mg twice daily for anxiety, Prozac 60 mg daily for depression olanzapine 10 mg at bedtime for mood stabilization.  She is on pain medicine will cut Xanax down to 0.5 mg twice daily and 1 mg at bedtime.  He will return to see me in 3 months   [name], MD 07/01/2021, 12:01 PM

## 2021-07-07 ENCOUNTER — Encounter: Payer: Self-pay | Admitting: Family Medicine

## 2021-07-07 ENCOUNTER — Other Ambulatory Visit: Payer: Self-pay

## 2021-07-07 ENCOUNTER — Ambulatory Visit (INDEPENDENT_AMBULATORY_CARE_PROVIDER_SITE_OTHER): Payer: Medicare HMO | Admitting: Family Medicine

## 2021-07-07 VITALS — BP 160/90 | HR 80 | Temp 98.8°F | Ht 67.5 in | Wt 167.2 lb

## 2021-07-07 DIAGNOSIS — G894 Chronic pain syndrome: Secondary | ICD-10-CM

## 2021-07-07 DIAGNOSIS — E782 Mixed hyperlipidemia: Secondary | ICD-10-CM | POA: Diagnosis not present

## 2021-07-07 DIAGNOSIS — Z125 Encounter for screening for malignant neoplasm of prostate: Secondary | ICD-10-CM | POA: Diagnosis not present

## 2021-07-07 DIAGNOSIS — J449 Chronic obstructive pulmonary disease, unspecified: Secondary | ICD-10-CM

## 2021-07-07 DIAGNOSIS — I25119 Atherosclerotic heart disease of native coronary artery with unspecified angina pectoris: Secondary | ICD-10-CM | POA: Diagnosis not present

## 2021-07-07 DIAGNOSIS — F418 Other specified anxiety disorders: Secondary | ICD-10-CM

## 2021-07-07 DIAGNOSIS — F5105 Insomnia due to other mental disorder: Secondary | ICD-10-CM

## 2021-07-07 DIAGNOSIS — Z23 Encounter for immunization: Secondary | ICD-10-CM

## 2021-07-07 DIAGNOSIS — I10 Essential (primary) hypertension: Secondary | ICD-10-CM

## 2021-07-07 DIAGNOSIS — F324 Major depressive disorder, single episode, in partial remission: Secondary | ICD-10-CM | POA: Diagnosis not present

## 2021-07-07 DIAGNOSIS — R911 Solitary pulmonary nodule: Secondary | ICD-10-CM

## 2021-07-07 MED ORDER — HYDROCODONE-ACETAMINOPHEN 5-325 MG PO TABS: ORAL_TABLET | ORAL | 0 refills | Status: DC

## 2021-07-07 NOTE — Patient Instructions (Signed)
Hi [place] It was good to see you today.  Please try to do a little bit of walking every day and that is nice outside. Work hard to eating healthy and keeping your weight from dropping.  I would like you to follow-up in 2 weeks, please bring all of your medications with you so we can review over them and recheck your blood pressure.  Your blood pressure was mildly elevated today.  As for the pain medicine I would recommend 1 twice daily only if needed.  You may take 1 in the morning times if necessary for discomforts in your legs and 1 late afternoon/suppertime as needed for leg pain.  If you do not feel you need the medication then do not take it for that day.  I am prescribing 45 tablets which should last for a full month.  Once again please follow-up in 2 weeks bring all your medications with you. TakeCare-Dr. [name]

## 2021-07-07 NOTE — Progress Notes (Signed)
Subjective:    Patient ID: [name], male    DOB: Oct 15, 1955, 66 y.o.   MRN: [id number]  HPI Patient has been taking medications like prescribed. Gets up a little sore in the morning. No chest pain or difficulty breathing. Tries to exercise some.  Essential hypertension - Plan: Basic Metabolic Panel (BMET), CBC with Differential, Lipid Profile, HgB A1c, Hepatic function panel, Urine Microalbumin w/creat. ratio  Mixed hyperlipidemia - Plan: Basic Metabolic Panel (BMET), CBC with Differential, Lipid Profile, HgB A1c, Hepatic function panel, Urine Microalbumin w/creat. ratio  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 anxiety - Plan: Basic Metabolic Panel (BMET), CBC with Differential, Lipid Profile, HgB A1c, Hepatic function panel, Urine Microalbumin w/creat. ratio  Chronic pain syndrome - Plan: Basic Metabolic Panel (BMET), CBC with Differential, Lipid Profile, HgB A1c, Hepatic function panel, Urine Microalbumin w/creat. ratio  Screening PSA (prostate specific antigen) - Plan: Basic Metabolic Panel (BMET), CBC with Differential, Lipid Profile, HgB A1c, PSA, Hepatic function panel, Urine Microalbumin w/creat. ratio  Pulmonary nodule - Plan: Basic Metabolic Panel (BMET), CBC with Differential, Lipid Profile, HgB A1c, Hepatic function panel, Urine Microalbumin w/creat. ratio  Need for vaccination - Plan: Flu Vaccine QUAD 6+ mos PF IM (Fluarix Quad PF), Basic Metabolic Panel (BMET), CBC with Differential, Lipid Profile, HgB A1c, Hepatic function panel, Urine Microalbumin w/creat. ratio  Chronic obstructive pulmonary disease, unspecified COPD type ([place]), Chronic  Depression, major, single episode, in partial remission ([place]), Chronic  Coronary artery disease involving native coronary artery of native heart with angina pectoris ([place]), Chronic  I reviewed over his hospital records He did have a pulmonary nodule on a head and neck scan He has been a smoker for years so it would be wise to do a  follow-up scan  Blood pressure not well controlled patient is uncertain regarding his medications we did go over them today  He is staying away from smoking.  States depression is doing fair.  Being monitored by Dr. Harrington Challenger.  Not suicidal.  Chronic pain he has chronic pain in his legs for which he does take hydrocodone.  He typically takes it once or twice per day.  He has been on this for years along with his other medicines.  I am opting to go with a lower dose hydrocodone and currently restricting to 45 tablets/month given his other medicines  He does get occasional chest pains but I believe that these are related to his surgical scar and not due to current infection or cardiac issues  Review of Systems     Objective:   Physical Exam General-in no acute distress Eyes-no discharge Lungs-respiratory rate normal, CTA CV-no murmurs,RRR Extremities skin warm dry no edema Neuro grossly normal Behavior normal, alert        Assessment & Plan:  1. Essential hypertension Blood pressure issues not under good control patient's do a better job of knowing his medicines and taking his medicines very important for him to follow-up we will recheck him in 2 weeks he is to bring all his medicines with him - Basic Metabolic Panel (BMET) - CBC with Differential - Lipid Profile - HgB A1c - Hepatic function panel - Urine Microalbumin w/creat. ratio  2. Mixed hyperlipidemia Check lab work continue medication strive for LDL below 70 - Basic Metabolic Panel (BMET) - CBC with Differential - Lipid Profile - HgB A1c - Hepatic function panel - Urine Microalbumin w/creat. ratio  3. 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 anxiety May use the Xanax at nighttime to  help her with sleep but I have advised the patient not to take his hydrocodone near bedtime he may take a hydrocodone early in the morning if necessary may repeat it again at 5 PM if necessary - Basic Metabolic Panel (BMET) - CBC with  Differential - Lipid Profile - HgB A1c - Hepatic function panel - Urine Microalbumin w/creat. ratio  4. Chronic pain syndrome No more than 2/day we are trying to stick with 45/month currently - Basic Metabolic Panel (BMET) - CBC with Differential - Lipid Profile - HgB A1c - Hepatic function panel - Urine Microalbumin w/creat. ratio  5. Screening PSA (prostate specific antigen) Screening for prostate cancer - Basic Metabolic Panel (BMET) - CBC with Differential - Lipid Profile - HgB A1c - PSA - Hepatic function panel - Urine Microalbumin w/creat. ratio  6. Pulmonary nodule High risk for lung cancer because of smoking history pulmonary nodule was seen on CAT scan of head and neck he needs a complete chest CT scan but we will do it 6 months after the previous scan of which would end up being in April of this year - Basic Metabolic Panel (BMET) - CBC with Differential - Lipid Profile - HgB A1c - Hepatic function panel - Urine Microalbumin w/creat. ratio  7. Need for vaccination For today - Flu Vaccine QUAD 6+ mos PF IM (Fluarix Quad PF) - Basic Metabolic Panel (BMET) - CBC with Differential - Lipid Profile - HgB A1c - Hepatic function panel - Urine Microalbumin w/creat. ratio  8. Chronic obstructive pulmonary disease, unspecified COPD type ([place]) Long history of smoking.  Albuterol as needed.  He does not smoke currently.  He does get short of breath with activity but some of this could be related to deconditioning and his underlying heart but certainly some of it related to the COPD  9. Depression, major, single episode, in partial remission ([place]) Managed by Dr. Harrington Challenger patient feels that things are improving to some degree  10. Coronary artery disease involving native coronary artery of native heart with angina pectoris [facility]) Previous surgery doing well from that need to get blood pressure under good control  Patient is completely disabled

## 2021-07-19 ENCOUNTER — Other Ambulatory Visit: Payer: Self-pay | Admitting: Cardiology

## 2021-07-19 ENCOUNTER — Other Ambulatory Visit: Payer: Self-pay | Admitting: Gastroenterology

## 2021-07-19 ENCOUNTER — Other Ambulatory Visit: Payer: Self-pay | Admitting: Family Medicine

## 2021-07-21 ENCOUNTER — Ambulatory Visit (INDEPENDENT_AMBULATORY_CARE_PROVIDER_SITE_OTHER): Payer: Medicare HMO | Admitting: Family Medicine

## 2021-07-21 ENCOUNTER — Telehealth (HOSPITAL_COMMUNITY): Payer: Self-pay | Admitting: Psychiatry

## 2021-07-21 ENCOUNTER — Other Ambulatory Visit: Payer: Self-pay

## 2021-07-21 VITALS — BP 156/92 | HR 67 | Ht 67.5 in | Wt 161.0 lb

## 2021-07-21 DIAGNOSIS — Z23 Encounter for immunization: Secondary | ICD-10-CM | POA: Diagnosis not present

## 2021-07-21 DIAGNOSIS — I10 Essential (primary) hypertension: Secondary | ICD-10-CM | POA: Diagnosis not present

## 2021-07-21 DIAGNOSIS — R911 Solitary pulmonary nodule: Secondary | ICD-10-CM | POA: Diagnosis not present

## 2021-07-21 DIAGNOSIS — F5105 Insomnia due to other mental disorder: Secondary | ICD-10-CM | POA: Diagnosis not present

## 2021-07-21 DIAGNOSIS — G894 Chronic pain syndrome: Secondary | ICD-10-CM | POA: Diagnosis not present

## 2021-07-21 DIAGNOSIS — Z125 Encounter for screening for malignant neoplasm of prostate: Secondary | ICD-10-CM | POA: Diagnosis not present

## 2021-07-21 DIAGNOSIS — E782 Mixed hyperlipidemia: Secondary | ICD-10-CM | POA: Diagnosis not present

## 2021-07-21 DIAGNOSIS — F418 Other specified anxiety disorders: Secondary | ICD-10-CM | POA: Diagnosis not present

## 2021-07-21 MED ORDER — VALSARTAN 40 MG PO TABS: 40.0000 mg | ORAL_TABLET | Freq: Every day | ORAL | 1 refills | Status: DC

## 2021-07-21 NOTE — Progress Notes (Signed)
   Subjective:    Patient ID: [name], male    DOB: 10/27/1955, 66 y.o.   MRN: [id number]  HPI  Patient arrives for a 2 week follow up on blood pressure. Patient states his blood pressure is still running high. Patient brings his medication read out but did not bring his medicines with him states he takes his medicine but did not take his blood pressure medicine this morning denies chest pain shortness of breath Review of Systems     Objective:   Physical Exam  General-in no acute distress Eyes-no discharge Lungs-respiratory rate normal, CTA CV-no murmurs,RRR Extremities skin warm dry no edema Neuro grossly normal Behavior normal, alert       Assessment & Plan:  HTN Subpar Add valsartan 40 mg daily Follow-up within 3 weeks to recheck We will repeat metabolic 7 on follow-up Take amlodipine daily make sure he takes his medicine before follow-up visit

## 2021-07-21 NOTE — Telephone Encounter (Signed)
Called to schedule f/u appt unable to leave vm  °

## 2021-07-21 NOTE — Patient Instructions (Signed)
Add valsartan 40 mg daily  Continue amlodipine 5 mg daily  Recheck in 3 weeks

## 2021-07-22 ENCOUNTER — Encounter: Payer: Self-pay | Admitting: Family Medicine

## 2021-07-22 LAB — BASIC METABOLIC PANEL
BUN/Creatinine Ratio: 14 (ref 10–24)
BUN: 13 mg/dL (ref 8–27)
CO2: 26 mmol/L (ref 20–29)
Calcium: 9.4 mg/dL (ref 8.6–10.2)
Chloride: 104 mmol/L (ref 96–106)
Creatinine, Ser: 0.9 mg/dL (ref 0.76–1.27)
Glucose: 97 mg/dL (ref 70–99)
Potassium: 4.6 mmol/L (ref 3.5–5.2)
Sodium: 146 mmol/L — ABNORMAL HIGH (ref 134–144)
eGFR: 95 mL/min/{1.73_m2} (ref >59)

## 2021-07-22 LAB — CBC WITH DIFFERENTIAL/PLATELET
Basophils Absolute: 0 10*3/uL (ref 0.0–0.2)
Basos: 1 %
EOS (ABSOLUTE): 0.1 10*3/uL (ref 0.0–0.4)
Eos: 1 %
Hematocrit: 49.9 % (ref 37.5–51.0)
Hemoglobin: 16.9 g/dL (ref 13.0–17.7)
Immature Grans (Abs): 0 10*3/uL (ref 0.0–0.1)
Immature Granulocytes: 0 %
Lymphocytes Absolute: 1.9 10*3/uL (ref 0.7–3.1)
Lymphs: 27 %
MCH: 28.6 pg (ref 26.6–33.0)
MCHC: 33.9 g/dL (ref 31.5–35.7)
MCV: 85 fL (ref 79–97)
Monocytes Absolute: 0.5 10*3/uL (ref 0.1–0.9)
Monocytes: 7 %
Neutrophils Absolute: 4.6 10*3/uL (ref 1.4–7.0)
Neutrophils: 64 %
Platelets: 243 10*3/uL (ref 150–450)
RBC: 5.9 x10E6/uL — ABNORMAL HIGH (ref 4.14–5.80)
RDW: 14 % (ref 11.6–15.4)
WBC: 7.2 10*3/uL (ref 3.4–10.8)

## 2021-07-22 LAB — LIPID PANEL
Chol/HDL Ratio: 3.6 ratio (ref 0.0–5.0)
Cholesterol, Total: 158 mg/dL (ref 100–199)
HDL: 44 mg/dL (ref >39)
LDL Chol Calc (NIH): 96 mg/dL (ref 0–99)
Triglycerides: 99 mg/dL (ref 0–149)
VLDL Cholesterol Cal: 18 mg/dL (ref 5–40)

## 2021-07-22 LAB — MICROALBUMIN / CREATININE URINE RATIO
Creatinine, Urine: 66.1 mg/dL
Microalb/Creat Ratio: <5 mg/g creat (ref 0–29)
Microalbumin, Urine: <3 ug/mL

## 2021-07-22 LAB — HEPATIC FUNCTION PANEL
ALT: 17 IU/L (ref 0–44)
AST: 15 IU/L (ref 0–40)
Albumin: 4.5 g/dL (ref 3.8–4.8)
Alkaline Phosphatase: 116 IU/L (ref 44–121)
Bilirubin Total: 0.4 mg/dL (ref 0.0–1.2)
Bilirubin, Direct: 0.13 mg/dL (ref 0.00–0.40)
Total Protein: 7.1 g/dL (ref 6.0–8.5)

## 2021-07-22 LAB — PSA: Prostate Specific Ag, Serum: 1.3 ng/mL (ref 0.0–4.0)

## 2021-07-22 LAB — HEMOGLOBIN A1C
Est. average glucose Bld gHb Est-mCnc: 123 mg/dL
Hgb A1c MFr Bld: 5.9 % — ABNORMAL HIGH (ref 4.8–5.6)

## 2021-08-11 ENCOUNTER — Ambulatory Visit (INDEPENDENT_AMBULATORY_CARE_PROVIDER_SITE_OTHER): Payer: Medicare HMO | Admitting: Family Medicine

## 2021-08-11 ENCOUNTER — Encounter: Payer: Self-pay | Admitting: Family Medicine

## 2021-08-11 ENCOUNTER — Other Ambulatory Visit: Payer: Self-pay

## 2021-08-11 VITALS — BP 156/98 | HR 66 | Temp 97.8°F | Ht 67.5 in | Wt 167.0 lb

## 2021-08-11 DIAGNOSIS — I10 Essential (primary) hypertension: Secondary | ICD-10-CM

## 2021-08-11 MED ORDER — VALSARTAN 80 MG PO TABS: 80.0000 mg | ORAL_TABLET | Freq: Every day | ORAL | 1 refills | Status: DC

## 2021-08-11 NOTE — Progress Notes (Signed)
° °  Subjective:    Patient ID: [name], male    DOB: 14-Aug-1955, 66 y.o.   MRN: [id number]  Hypertension This is a chronic problem. Treatments tried: amlodipine, valsartan.  Follow up for blood pressure He is here today for follow-up blood pressure takes his medicine regular basis.  Denies any chest tightness pressure pain shortness of breath.  Did not take his medicine this morning.  PMH heart disease blood pressure issues smoking patient states since his bypass he has done a very good job of avoiding smoking only having occasional cigarette  Review of Systems     Objective:   Physical Exam General-in no acute distress Eyes-no discharge Lungs-respiratory rate normal, CTA CV-no murmurs,RRR Extremities skin warm dry no edema Neuro grossly normal Behavior normal, alert        Assessment & Plan:  Patient has been encouraged to stay away from smoking 100% Take his blood pressure medicine regular basis Increase valsartan to 80 mg daily Follow through with pulmonary as well as cardiology this week Recheck with [place] in 6 weeks Stay active Maximize vegetables and fruits minimize salty foods Fitted walking on a regular basis

## 2021-08-11 NOTE — Patient Instructions (Signed)
Hi [name] to see you today.  BP medicine valsartan Start taking 2 of the 40mg  valsartan every day When you run out of those you will take One of the 80 mg Valsartan daily  Recheck here in 6 weeks Thanks -Dr [name]

## 2021-08-12 ENCOUNTER — Other Ambulatory Visit: Payer: Self-pay

## 2021-08-12 ENCOUNTER — Ambulatory Visit: Payer: Medicare HMO | Admitting: Pulmonary Disease

## 2021-08-12 ENCOUNTER — Encounter: Payer: Self-pay | Admitting: Pulmonary Disease

## 2021-08-12 VITALS — BP 140/86 | HR 64 | Temp 98.4°F | Ht 67.5 in | Wt 171.4 lb

## 2021-08-12 DIAGNOSIS — Z87891 Personal history of nicotine dependence: Secondary | ICD-10-CM | POA: Diagnosis not present

## 2021-08-12 DIAGNOSIS — J432 Centrilobular emphysema: Secondary | ICD-10-CM

## 2021-08-12 DIAGNOSIS — R0683 Snoring: Secondary | ICD-10-CM

## 2021-08-12 MED ORDER — TRELEGY ELLIPTA 100-62.5-25 MCG/ACT IN AEPB: 1.0000 | INHALATION_SPRAY | Freq: Every day | RESPIRATORY_TRACT | 5 refills | Status: DC

## 2021-08-12 NOTE — Patient Instructions (Signed)
Lab test today ?Will arrange for home sleep study ?Trelegy 1 puff daily, and rinse your mouth after each use ?Stop using symbicort once you get trelegy ?Will arrange for referral to [facility] to discuss lung cancer screening program ?Will schedule pulmonary function test in [place] office and then have appointment right after in 6 to 8 weeks ?

## 2021-08-12 NOTE — Progress Notes (Signed)
[facility]  Chief Complaint  Patient presents with   New Patient (Initial Visit)    Changing from Dr. Melvyn Novas. Dx with COPD.     Past Surgical History:  He  has a past surgical history that includes Colonoscopy w/ polypectomy (2004); Hand surgery; Colonoscopy (06/2009); Esophagogastroduodenoscopy (02/2009); Esophagogastroduodenoscopy (12/30/2010); Flexible sigmoidoscopy (12/30/2010); heart stent; Nasal septoplasty w/ turbinoplasty (10/06/2011); Cardiac catheterization; Coronary angioplasty with stent (01/26/2012); Esophagogastroduodenoscopy (egd) with esophageal dilation (N/A, 09/12/2012); Cholecystectomy (2004); Colonoscopy (N/A, 07/31/2013); Esophagogastroduodenoscopy (egd) with esophageal dilation (N/A, 07/31/2013); Hemorrhoid banding; left heart catheterization with coronary angiogram (N/A, 01/26/2012); percutaneous coronary stent intervention (pci-s) (N/A, 01/26/2012); Colonoscopy with propofol (N/A, 09/15/2015); Esophagogastroduodenoscopy (egd) with propofol (N/A, 09/15/2015); maloney dilation (N/A, 09/15/2015); polypectomy (09/15/2015); Colonoscopy with propofol (N/A, 12/19/2017); Esophagogastroduodenoscopy (egd) with propofol (N/A, 12/19/2017); maloney dilation (N/A, 12/19/2017); biopsy (12/19/2017); polypectomy (12/19/2017); LEFT HEART CATH AND CORONARY ANGIOGRAPHY (N/A, 08/29/2019); Esophagogastroduodenoscopy (egd) with propofol (N/A, 09/17/2019); maloney dilation (N/A, 09/17/2019); Coronary/Graft Acute MI Revascularization (N/A, 03/10/2021); LEFT HEART CATH AND CORONARY ANGIOGRAPHY (N/A, 03/10/2021); Coronary artery bypass graft (N/A, 03/13/2021); TEE without cardioversion (N/A, 03/13/2021); and Endoharvest vein of greater saphenous vein (Right, 03/13/2021).  Past Medical History:  Anxiety, Depression, CAD s/p CABG, Ischemic colitis, Diverticulosis, GERD, Hiatal hernia, HLD, HTN, RLS, Schatzki's ring, Colon polyps  Constitutional:  BP 140/86 (BP Location: Left Arm, Patient Position:  Sitting)    Pulse 64    Temp 98.4 F (36.9 C) (Temporal)    Ht 5' 7.5" (1.715 m)    Wt 171 lb 6.4 oz (77.7 kg)    SpO2 97% Comment: ra   BMI 26.45 kg/m   Brief Summary:  [name] is a 66 y.o. 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       Subjective:   He started smoking in 1971.  He smoked 1 ppd and quit in October 2022 after he had CABG.  His brother has emphysema.  His PFT from 2013 showed mild obstruction and moderate diffusion defect.  CT chest from 2014 showed bullous emphysema.  He has been using symbicort.  He was on trelegy before and this worked better.  Has cough at night and wakes him up.  Has snoring and stops breathing at night.  Feels tired during the day, and can fall asleep when sitting.  He gets winded walking about 100 feet.  SpO2 on room air while walking today stayed above 92%.  Has occasional wheezing.  Not having fever, leg swelling, or hemoptysis.  No prior history of pneumonia.  Physical Exam:   Appearance - well kempt   ENMT - no sinus tenderness, no oral exudate, no LAN, Mallampati 3 airway, no stridor, poor dentition  Respiratory - decreased breath sounds bilaterally, no wheezing or rales  CV - s1s2 regular rate and rhythm, no murmurs  Ext - no clubbing, no edema  Skin - no rashes  Psych - normal mood and affect   Pulmonary testing:  PFT 05/17/12 >> FEV1 3.4 (81%), FEV1% 67, TLC 5.89 (84%), DLCO 53%  Chest Imaging:  CT chest 04/09/13 >> centrilobular and paraseptal emphysema with bullous changes in Rt chest, scattered nodules up to 8 mm  Sleep Tests:  PSG 05/05/16 >> AHI 0.4, SpO2 low 88%  Cardiac Tests:  Echo 03/11/21 >> EF 45 to 50%, mild/mod MR  Social History:  He  reports that he quit smoking about 4 months ago. His smoking use included cigarettes. He started smoking about 52 years ago. He has a  48.00 pack-year smoking history. He has quit using smokeless tobacco. He reports current alcohol use. He reports that he does not use  drugs.  Family History:  His family history includes Cancer in his maternal aunt and paternal uncle; Depression in his mother; Heart disease in his mother; Hypertension in his brother; Kidney failure in his maternal uncle; Lung disease in his father.     Assessment/Plan:   COPD with emphysema. - repeat PFT, check alpha 1 antitrypsin level - change from symbicort to trelegy 100 one puff daily - prn albuterol - might benefit from pulmonary rehab; assess after reviewing his PFT  Snoring. - has sleep disruption, apnea, and daytime sleepiness - he might have obstructive sleep apnea - will arrange for home sleep study to assess further  History of tobacco abuse. - will arrange for referral to [facility] to discuss enrolling in the lung cancer screening program  Coronary artery disease. - followed by Dr. Carlyle Dolly with cardiology  Time Spent Involved in Patient Care on Day of Examination:  45 minutes  Follow up:   Patient Instructions  Lab test today Will arrange for home sleep study Trelegy 1 puff daily, and rinse your mouth after each use Stop using symbicort once you get trelegy Will arrange for referral to [facility] to discuss lung cancer screening program Will schedule pulmonary function test in [place] office and then have appointment right after in 6 to 8 weeks  Medication List:   Allergies as of 08/12/2021   No Known Allergies      Medication List        Accurate as of August 12, 2021 10:23 AM. If you have any questions, ask your nurse or doctor.          STOP taking these medications    naloxone 4 MG/0.1ML Liqd nasal spray kit Commonly known as: NARCAN Stopped by: Chesley Mires, MD   nicotine 14 mg/24hr patch Commonly known as: NICODERM CQ - dosed in mg/24 hours Stopped by: Chesley Mires, MD       TAKE these medications    albuterol 108 (90 Base) MCG/ACT inhaler Commonly known as: VENTOLIN HFA Inhale 2 puffs into the lungs every 4 (four)  hours as needed for wheezing or shortness of breath.   ALPRAZolam 0.5 MG tablet Commonly known as: Xanax Take one twice a day and two at bedtime   amLODipine 5 MG tablet Commonly known as: NORVASC Take 5 mg by mouth daily.   aspirin EC 81 MG tablet Take 81 mg by mouth daily.   atorvastatin 80 MG tablet Commonly known as: LIPITOR TAKE 1 TABLET BY MOUTH EVERY DAY   clopidogrel 75 MG tablet Commonly known as: PLAVIX Take 1 tablet (75 mg total) by mouth daily.   Creon 36000 UNITS Cpep capsule Generic drug: lipase/protease/amylase TAKE 2 CAPSULES BY MOUTH THREE TIMES DAILY WITH MEALS. 1 CAPSULE WITH SNACKS What changed: See the new instructions.   cyanocobalamin 2000 MCG tablet Take 2,000 mcg by mouth daily.   dicyclomine 10 MG capsule Commonly known as: BENTYL TAKE 1 CAPSULE (10 MG TOTAL) BY MOUTH 4 (FOUR) TIMES DAILY - BEFORE MEALS AND AT BEDTIME. What changed: See the new instructions.   FLUoxetine 20 MG capsule Commonly known as: PROZAC Take 1 capsule (20 mg total) by mouth daily. Take with 65m for a total daily dose of 667m  FLUoxetine 40 MG capsule Commonly known as: PROZAC Take 1 capsule (40 mg total) by mouth daily. Take with 2053m  for a total daily dose of 57m   gabapentin 300 MG capsule Commonly known as: NEURONTIN Take 2 capsules (600 mg total) by mouth 3 (three) times daily.   HYDROcodone-acetaminophen 5-325 MG tablet Commonly known as: NORCO/VICODIN 1 twice daily as needed   metoprolol succinate 50 MG 24 hr tablet Commonly known as: TOPROL-XL TAKE 1 TABLET (50 MG TOTAL) BY MOUTH DAILY. PLEASE SCHEDULE FOLLOW UP APPOINTMENT. THANK YOU   nitroGLYCERIN 0.4 MG SL tablet Commonly known as: NITROSTAT Place 1 tablet (0.4 mg total) under the tongue every 5 (five) minutes as needed for chest pain.   OLANZapine 10 MG tablet Commonly known as: ZyPREXA Take 1 tablet (10 mg total) by mouth at bedtime.   pantoprazole 40 MG tablet Commonly known as:  PROTONIX TAKE 1 TABLET BY MOUTH TWICE A DAY BEFORE A MEAL   rOPINIRole 2 MG tablet Commonly known as: REQUIP TAKE 1 TABLET BY MOUTH EVERYDAY AT BEDTIME   tamsulosin 0.4 MG Caps capsule Commonly known as: FLOMAX Take 1 capsule (0.4 mg total) by mouth daily.   traZODone 50 MG tablet Commonly known as: DESYREL TAKE 1 TABLET BY MOUTH EVERYDAY AT BEDTIME   Trelegy Ellipta 100-62.5-25 MCG/ACT Aepb Generic drug: Fluticasone-Umeclidin-Vilant Inhale 1 puff into the lungs daily. Started by: VChesley Mires MD   valsartan 80 MG tablet Commonly known as: DIOVAN Take 1 tablet (80 mg total) by mouth daily.        Signature:  VChesley Mires MD LLarnedPager - ([phone]/06/2021, 10:23 AM

## 2021-08-13 ENCOUNTER — Telehealth: Payer: Self-pay | Admitting: Cardiology

## 2021-08-13 ENCOUNTER — Other Ambulatory Visit: Payer: Self-pay | Admitting: *Deleted

## 2021-08-13 NOTE — Telephone Encounter (Signed)
Patient requesting refill of Hydrocodone to [facility] ?

## 2021-08-13 NOTE — Telephone Encounter (Signed)
Spoke to pt who stated that he saw Dr. [name] on 2/28. Per Dr. Lance Sell office note he increased pt's Valsartan to 80 mg tablets daily. Pt c/o dizziness, but no other symptoms. Pt stated he did not have a bp log, but would start keeping one. Pt stated that he is currently taking all cardiac medications including Norvasc 5 mg, ASA 81 mg, Lipitor 80 mg, Plavix 75 mg, Valsartan 80 mg and Toprol XL 50 mg. Pt is concerned that bp is running too high.  ? ?Please advise.  ?

## 2021-08-13 NOTE — Telephone Encounter (Signed)
Pt c/o BP issue: ? ?1. What are your last 5 BP readings?  ?147/110 2:00pm  ?184/121  4:30pm    P 67 ?2. Are you having any other symptoms (ex. Dizziness, headache, blurred vision, passed out)? Has dizzy spells  ?3. What is your medication issue? Concerned about his medications and elevated BP   ? ?Was seen by Dr. [name] in regards to his elevated BP and he added medications to patient.  ? ?[phone] ?

## 2021-08-14 ENCOUNTER — Ambulatory Visit: Payer: Medicare PPO | Admitting: Cardiology

## 2021-08-14 MED ORDER — HYDROCODONE-ACETAMINOPHEN 5-325 MG PO TABS: ORAL_TABLET | ORAL | 0 refills | Status: DC

## 2021-08-14 NOTE — Telephone Encounter (Signed)
Patient notified

## 2021-08-17 ENCOUNTER — Other Ambulatory Visit: Payer: Self-pay | Admitting: *Deleted

## 2021-08-17 LAB — ALPHA-1-ANTITRYPSIN PHENOTYP: A-1 Antitrypsin: 150 mg/dL (ref 101–187)

## 2021-08-17 NOTE — Telephone Encounter (Signed)
Did the dizzines start after the valsartan increase or was it going on prior. Does he stay well hydrated during the day, at least 4-6 bottles of water daily? Note caffeinated beverages do not count toward daily hydration ? ? ?Zandra Abts MD ?

## 2021-08-17 NOTE — Telephone Encounter (Signed)
Patient returned call

## 2021-08-17 NOTE — Telephone Encounter (Signed)
Received fax from [facility] [address] ? ? ? ?Hydrocodone Acetamin 5-'325mg'$  -product on backorder/unavailable/cannot get medication right now ? ? ?Please advise ?

## 2021-08-17 NOTE — Telephone Encounter (Signed)
Left message for pt to call office back

## 2021-08-18 MED ORDER — HYDROCODONE-ACETAMINOPHEN 5-325 MG PO TABS: ORAL_TABLET | ORAL | 0 refills | Status: DC

## 2021-08-18 NOTE — Telephone Encounter (Signed)
So in this situation the patient will need to call around to other pharmacies to find a pharmacy willing to fill his hydrocodone prescription of hydrocodone 5/325 #45 1 twice daily as needed perhaps [facility] once he [facility]ates this and would like for [place] to forward the prescription to let us know ?

## 2021-08-18 NOTE — Telephone Encounter (Signed)
Pt stated that his issues started before the Valsartan increase. Pt states that he does stay well hydrated throughout the day with water and will occasionally have a caffeinated drink. Pt voiced that in the mornings bp will be on the higher end of the spectrum, but after taking medication will drop into a normal range.  ? ?Please advise.   ?

## 2021-08-18 NOTE — Telephone Encounter (Signed)
Patient aware.

## 2021-08-18 NOTE — Telephone Encounter (Signed)
Patient would like script sent to [facility]- they have the medication ?

## 2021-08-18 NOTE — Telephone Encounter (Signed)
Could be that taking all his bp meds in the morning is causing some dizziness. Try taking the valsartan in the evening and update [place] on symptoms of dizziness. Can we clarify if the dizzienss happens only while standing or if it ever occurs while sitting or standing ? ?Zandra Abts MD ?

## 2021-08-20 NOTE — Telephone Encounter (Signed)
Attempted to contact pt. No answer, no voicemail.  ?

## 2021-08-21 NOTE — Telephone Encounter (Signed)
Pt stated that the dizziness varies, it can occur while sitting or standing. Pt states he cannot stand up quickly or he becomes lightheaded. Pt will update office on dizziness after starting Valsartan in the evening.  ?

## 2021-08-24 ENCOUNTER — Other Ambulatory Visit: Payer: Self-pay

## 2021-08-24 ENCOUNTER — Other Ambulatory Visit: Payer: Self-pay | Admitting: Gastroenterology

## 2021-08-24 MED ORDER — PANCRELIPASE (LIP-PROT-AMYL) 36000-114000 UNITS PO CPEP: ORAL_CAPSULE | ORAL | 3 refills | Status: DC

## 2021-08-24 NOTE — Telephone Encounter (Signed)
Last office visit 02/27/21 ?

## 2021-09-01 ENCOUNTER — Telehealth: Payer: Self-pay | Admitting: Family Medicine

## 2021-09-01 ENCOUNTER — Other Ambulatory Visit: Payer: Self-pay

## 2021-09-01 ENCOUNTER — Other Ambulatory Visit (HOSPITAL_COMMUNITY): Payer: Self-pay | Admitting: Psychiatry

## 2021-09-01 MED ORDER — FLUOXETINE HCL 40 MG PO CAPS: 40.0000 mg | ORAL_CAPSULE | Freq: Every day | ORAL | 2 refills | Status: DC

## 2021-09-01 MED ORDER — ALPRAZOLAM 0.5 MG PO TABS: ORAL_TABLET | ORAL | 2 refills | Status: DC

## 2021-09-01 MED ORDER — FLUOXETINE HCL 20 MG PO CAPS: 20.0000 mg | ORAL_CAPSULE | Freq: Every day | ORAL | 2 refills | Status: DC

## 2021-09-01 MED ORDER — VALSARTAN 80 MG PO TABS: 80.0000 mg | ORAL_TABLET | Freq: Every day | ORAL | 1 refills | Status: DC

## 2021-09-01 MED ORDER — ROPINIROLE HCL 2 MG PO TABS
ORAL_TABLET | ORAL | 1 refills | Status: DC
Start: 2021-09-01 — End: 2021-12-23

## 2021-09-01 NOTE — Telephone Encounter (Signed)
[facility] requesting refill on Ropinirole 2 mg tablet. Take one tablet po everyday at bedtime. Pt last seen 08/11/21 for HTN. Please advise. Thank you ?

## 2021-09-01 NOTE — Telephone Encounter (Signed)
90-day with 1 refill 

## 2021-09-01 NOTE — Telephone Encounter (Signed)
Prescription sent electronically to pharmacy. 

## 2021-09-03 ENCOUNTER — Other Ambulatory Visit: Payer: Self-pay | Admitting: *Deleted

## 2021-09-03 DIAGNOSIS — R911 Solitary pulmonary nodule: Secondary | ICD-10-CM

## 2021-09-03 MED ORDER — TRELEGY ELLIPTA 100-62.5-25 MCG/ACT IN AEPB: 1.0000 | INHALATION_SPRAY | Freq: Every day | RESPIRATORY_TRACT | 3 refills | Status: DC

## 2021-09-08 ENCOUNTER — Encounter: Payer: Self-pay | Admitting: Gastroenterology

## 2021-09-08 ENCOUNTER — Ambulatory Visit: Payer: Medicare HMO | Admitting: Gastroenterology

## 2021-09-08 ENCOUNTER — Telehealth: Payer: Self-pay | Admitting: *Deleted

## 2021-09-08 ENCOUNTER — Other Ambulatory Visit (HOSPITAL_COMMUNITY): Payer: Self-pay | Admitting: Psychiatry

## 2021-09-08 ENCOUNTER — Telehealth: Payer: Self-pay

## 2021-09-08 ENCOUNTER — Other Ambulatory Visit: Payer: Self-pay

## 2021-09-08 VITALS — BP 148/82 | HR 65 | Temp 97.7°F | Ht 69.0 in | Wt 174.2 lb

## 2021-09-08 DIAGNOSIS — K625 Hemorrhage of anus and rectum: Secondary | ICD-10-CM

## 2021-09-08 DIAGNOSIS — K219 Gastro-esophageal reflux disease without esophagitis: Secondary | ICD-10-CM

## 2021-09-08 DIAGNOSIS — R195 Other fecal abnormalities: Secondary | ICD-10-CM

## 2021-09-08 DIAGNOSIS — Z85038 Personal history of other malignant neoplasm of large intestine: Secondary | ICD-10-CM | POA: Diagnosis not present

## 2021-09-08 DIAGNOSIS — R1319 Other dysphagia: Secondary | ICD-10-CM | POA: Diagnosis not present

## 2021-09-08 MED ORDER — FLUOXETINE HCL 20 MG PO CAPS: 20.0000 mg | ORAL_CAPSULE | Freq: Every day | ORAL | 2 refills | Status: DC

## 2021-09-08 MED ORDER — AMLODIPINE BESYLATE 5 MG PO TABS: ORAL_TABLET | ORAL | 1 refills | Status: DC

## 2021-09-08 MED ORDER — ALPRAZOLAM 0.5 MG PO TABS: ORAL_TABLET | ORAL | 2 refills | Status: DC

## 2021-09-08 MED ORDER — FLUOXETINE HCL 40 MG PO CAPS: 40.0000 mg | ORAL_CAPSULE | Freq: Every day | ORAL | 2 refills | Status: DC

## 2021-09-08 MED ORDER — TRAZODONE HCL 50 MG PO TABS: ORAL_TABLET | ORAL | 1 refills | Status: DC

## 2021-09-08 MED ORDER — TAMSULOSIN HCL 0.4 MG PO CAPS: 0.4000 mg | ORAL_CAPSULE | Freq: Every day | ORAL | 1 refills | Status: DC

## 2021-09-08 NOTE — Telephone Encounter (Signed)
Pharmacy called requesting refills on patients medication. ? ?Amlodipine 5 mg ?Gabapentin 500 mg ?Trazodone 50 mg ?Tamsulosin 0.4 mg.   ? ?Patient last seen on 08/11/21 for hypertension. Please advise. Thank you. ?

## 2021-09-08 NOTE — Telephone Encounter (Signed)
? ?  Name: [name]  ?DOB: 02/19/1956  ?MRN: [id number] ? ?Primary Cardiologist: Carlyle Dolly, MD ? ?Chart reviewed as part of pre-operative protocol coverage. Because of [name] past medical history and time since last visit, he will require a follow-up visit in order to better assess preoperative cardiovascular risk. He had h/o CAD with STEMI and CABG 02/2021. Had post-op visit on 03/2021 with recommendation to f/u in 6 months. Recent phone notes earlier this month indicated that he had been having dizziness and medications were adjusted. At today's visit, GI felt that patient would likely require office visit to be seen for evaluation, agree with this recommendation. BP was elevated at that visit. ? ?Pre-op covering staff: ?- Please schedule appointment and call patient to inform them.  ?- Please contact requesting surgeon's office via preferred method (i.e, phone, fax) to inform them of need for appointment prior to surgery. ? ?This message will also be routed to Dr Harl Bowie for input on holding Plavix prior to colonoscopy/EGD if needed so that this information is available to the clearing provider at time of patient's appointment. It was not specifically outlined that he would need to hold blood thinners but fairly standard to hold Plavix. Hopefully we can continue his ASA. ? ?[name] Pitter, PA-C  ?09/08/2021, 3:11 PM  ? ?

## 2021-09-08 NOTE — Telephone Encounter (Signed)
Attention: Preop ? ? ?We would like to request CARDIAC CLEARANCE for patient please. ? ?Procedure: Colonoscopy and EGD ? ?Date: TBD ? ?Medication to hold: N/A ? ?Surgeon: Dr. [name] ? ?Phone: [phone] ? ?Fax:  [phone] ? ?Type of Anesthesia: Propofol ? ? ? ? ? ? ?  ?

## 2021-09-08 NOTE — Telephone Encounter (Signed)
error 

## 2021-09-08 NOTE — Progress Notes (Signed)
? ? ? ?GI Office Note   ? ?Referring Provider: [name], MD ?Primary Care Physician:  [name], MD  ?Primary Gastroenterologist: Garfield Cornea, MD ? ? ?Chief Complaint  ? ?Chief Complaint  ?Patient presents with  ? Follow-up  ?  No current issues. Stated that he was supposed to have a colonoscopy but was in the hospital due to a bypass.   ? ? ? ?History of Present Illness  ? ?[name] is a 66 y.o. male presenting today, due for colonoscopy.  Last seen in the office in September 2022.  He was seen at that time for esophageal dysphagia, rectal bleeding, loose stools, and ongoing weight loss.  History of Schatzki ring previously dilated with good results but did not last long.  BPE have been ordered but not completed.  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  Due for surveillance colonoscopy in 2024.  History of symptomatic hemorrhoids for which he has been banded in the past with good results. ? ?After last office visit he was scheduled for a CT scan abdomen and pelvis, EGD and colonoscopy.  He had to cancel these because of having STEMI and urgent three-vessel CABG.  Patient was hospitalized September 27 through October 6.  Required inpatient rehab for 2 additional weeks.  States he lost a ton of weight because of the food offered.  Fortunately he has gained his weight back.  In fact he has gotten back to his weight from November 2021. ? ?Continues to have loose stools, does not seem to matter what he takes.  Really does not find Creon or dicyclomine effective.  Occasionally takes Imodium.  Stools are postprandial.  The more he eats the more stools he has.  For the most part has 3-4 loose stools per day.  Occasional nocturnal symptoms.  No abdominal pain.  No postprandial abdominal pain.  No heartburn.  Continues to have blood in the stool intermittently although feels like it is less frequent than prior to his  heart surgery.  Continues to have issues with swallowing solid food.  In addition he states he lost a few teeth while inpatient. ?  ?He is due to see his cardiologist.  He had an appointment earlier this month but this was canceled by the provider but currently does not have a follow-up appointment.  Switching care from Dr. Melvyn Novas to Dr. Llana Aliment.  PFT testing scheduled.  Saw his PCP last month with upcoming follow-up appointment.  He has been released from his cardiothoracic surgeon. ? ?He has 5 mm left upper lobe nodule, indeterminate.  Will require noncontrast chest CT for follow-up, has been ordered by PCP. ? ?Last CT abdomen imaging from 2014, although it has been scheduled but not completed on 2 occasions in the past 2 years. ?  ?November 2021: 172 pounds ?May 2022: 173 pounds ?July 2022: 153 pounds ?September 2022: 147 pounds ?October 2022: 135 pounds  ?January 2023: 167 pounds ?Today: 174 pounds ? ?Medications  ? ?Current Outpatient Medications  ?Medication Sig Dispense Refill  ? albuterol (VENTOLIN HFA) 108 (90 Base) MCG/ACT inhaler Inhale 2 puffs into the lungs every 4 (four) hours as needed for wheezing or shortness of breath. 18 g 1  ? ALPRAZolam (XANAX) 0.5 MG tablet Take one twice a day and two at bedtime 120 tablet 2  ? amLODipine (NORVASC) 5 MG tablet 1 take tablet ('5mg'$ ) by mouth Once  a day. 90 tablet 1  ? aspirin EC 81 MG tablet Take 81 mg by mouth daily.    ? atorvastatin (LIPITOR) 80 MG tablet TAKE 1 TABLET BY MOUTH EVERY DAY 90 tablet 3  ? clopidogrel (PLAVIX) 75 MG tablet Take 1 tablet (75 mg total) by mouth daily. 30 tablet 11  ? cyanocobalamin 2000 MCG tablet Take 2,000 mcg by mouth daily.    ? FLUoxetine (PROZAC) 20 MG capsule Take 1 capsule (20 mg total) by mouth daily. Take with '40mg'$  for a total daily dose of '60mg'$  90 capsule 2  ? FLUoxetine (PROZAC) 40 MG capsule Take 1 capsule (40 mg total) by mouth daily. Take with '20mg'$  for a total daily dose of '60mg'$  90 capsule 2  ? Fluticasone-Umeclidin-Vilant  (TRELEGY ELLIPTA) 100-62.5-25 MCG/ACT AEPB Inhale 1 puff into the lungs daily. 180 each 3  ? gabapentin (NEURONTIN) 300 MG capsule Take 2 capsules (600 mg total) by mouth 3 (three) times daily. 540 capsule 1  ? HYDROcodone-acetaminophen (NORCO/VICODIN) 5-325 MG tablet 1 twice daily as needed 45 tablet 0  ? lipase/protease/amylase (CREON) 36000 UNITS CPEP capsule Take 2 capsules (72,000 Units total) by mouth 3 (three) times daily with meals. May also take 1 capsule (36,000 Units total) as needed (with snacks). 240 capsule 3  ? metoprolol succinate (TOPROL-XL) 50 MG 24 hr tablet TAKE 1 TABLET (50 MG TOTAL) BY MOUTH DAILY. PLEASE SCHEDULE FOLLOW UP APPOINTMENT. THANK YOU 90 tablet 1  ? nitroGLYCERIN (NITROSTAT) 0.4 MG SL tablet Place 1 tablet (0.4 mg total) under the tongue every 5 (five) minutes as needed for chest pain. 20 tablet 5  ? OLANZapine (ZYPREXA) 10 MG tablet Take 1 tablet (10 mg total) by mouth at bedtime. 90 tablet 2  ? pantoprazole (PROTONIX) 40 MG tablet TAKE 1 TABLET BY MOUTH TWICE A DAY BEFORE A MEAL 180 tablet 3  ? rOPINIRole (REQUIP) 2 MG tablet TAKE 1 TABLET BY MOUTH EVERYDAY AT BEDTIME 90 tablet 1  ? tamsulosin (FLOMAX) 0.4 MG CAPS capsule Take 1 capsule (0.4 mg total) by mouth daily. 90 capsule 1  ? traZODone (DESYREL) 50 MG tablet TAKE 1 TABLET BY MOUTH EVERYDAY AT BEDTIME 90 tablet 1  ? valsartan (DIOVAN) 80 MG tablet Take 1 tablet (80 mg total) by mouth daily. 90 tablet 1  ? ?No current facility-administered medications for this visit.  ? ? ?Allergies  ? ?Allergies as of 09/08/2021  ? (No Known Allergies)  ? ? ?Past Medical History  ? ?Past Medical History:  ?Diagnosis Date  ? Anxiety   ? Anxiety and depression   ? ASCVD (arteriosclerotic cardiovascular disease)   ? a. s/p PTCA to LCx in 1993 b. BMS to RCA in 01/2012 with residual 80% OM2 stenosis and medical management recommended c. 08/2019: cath showing patent stents with occluded OM2 --> medical management.   ? Asthma   ? CAD (coronary  artery disease)   ? Carcinoma in situ of colon 2004  ? rectal polyp  ? Colitis, ischemic ([place]) 2011  ? COPD (chronic obstructive pulmonary disease) ([place])   ? mild ;excercise induced hypoxemia by cp stress test ;asthma ,bronchitis,  ? Depression   ? Diverticulosis   ? Gastritis 12/30/10  ? EGD Dr [name]  ? GERD (gastroesophageal reflux disease)   ? GI bleed   ? H. pylori infection 2004  ? treated  ? Hemorrhoids   ? Hiatal hernia   ? Hyperlipidemia   ? Hypertension   ? Inflammatory polyps of colon with  rectal bleeding ([place])   ? Leukocytosis   ? [name]  ? Lung nodule 07/16/2011  ? Myocardial infarction [facility])   ? age 68  ? Restless leg syndrome   ? Schatzki's ring   ? Sleep apnea   ? does not use CPAP:cannot tolerate, PCP aware  ? Syncope   ? Tobacco abuse   ? 50 pack years continuing at one halp pack daily  ? Tubular adenoma of colon 06/2009  ? Colonosocpy Dr [name]  ? ? ?Past Surgical History  ? ?Past Surgical History:  ?Procedure Laterality Date  ? BIOPSY  12/19/2017  ? Procedure: BIOPSY;  Surgeon: [name], MD;  [facility];  Service: Endoscopy;;  gastric  ? CARDIAC CATHETERIZATION    ? CHOLECYSTECTOMY  2004  ? COLONOSCOPY  06/2009  ? normal terminal ileum, segmental mild inflammation of sigmoid colon (bx unremarkable), polyp, tubular adenoma  ? COLONOSCOPY N/A 07/31/2013  ? Dr.Rourk- redundant anal canal hemorrhoids, colonic diverticulosis, tubular adenoma  ? COLONOSCOPY W/ POLYPECTOMY  2004  ? rectal polyp with carcinoma in situ removed via colonoscopy  ? COLONOSCOPY WITH PROPOFOL N/A 09/15/2015  ? Dr. [name]: Scattered diverticula throughout the colon, 2 sessile polyps found in the descending colon and cecum, 5 mm in size.  Cecal polyp was sessile serrated polyp, descending colon polyp was a tubular adenoma.  He had a abnormal perianal exam along with grade 3 hemorrhoids.  Surveillance exam recommended for 5-year follow-up.  ? COLONOSCOPY WITH PROPOFOL N/A 12/19/2017  ? one 5 mm polyp in ascending  colon and 1 cm sessile polyp in ascending s/p removal. Tubular adenoma. internal hemorrhoids  ? CORONARY ANGIOPLASTY WITH STENT PLACEMENT  01/26/2012  ? "1; total is now 2"  ? CORONARY ARTERY BYPASS GRAF

## 2021-09-08 NOTE — Telephone Encounter (Signed)
Spoke with pharmacy, told gabapentin needs to be refilled by patient's psychiatrist.  Pharmacy states that they will reach out to pt to get info so can get medication refilled.  ?

## 2021-09-08 NOTE — Patient Instructions (Signed)
We will contact cardiology for clearance before scheduling colonoscopy and upper endoscopy. ?

## 2021-09-08 NOTE — Telephone Encounter (Signed)
I am okay with prescribing 90-day with 1 refill of trazodone tamsulosin and amlodipine. ?Gabapentin is prescribed by his psychiatrist Dr. Harrington Challenger and she would need to be the one to prescribe this thank you ?

## 2021-09-09 NOTE — Telephone Encounter (Signed)
Ok to hold plavix, on DAPT in setting of ACS managed with CABG 6 months ago. Would continue aspirin unless absolutately neccesary to hold. Plavix would be held 5 days prior, resume day after ? ? ? ?Zandra Abts MD ?

## 2021-09-10 ENCOUNTER — Ambulatory Visit (INDEPENDENT_AMBULATORY_CARE_PROVIDER_SITE_OTHER): Payer: Medicare HMO | Admitting: Student

## 2021-09-10 ENCOUNTER — Encounter: Payer: Self-pay | Admitting: Student

## 2021-09-10 ENCOUNTER — Other Ambulatory Visit: Payer: Self-pay | Admitting: Family Medicine

## 2021-09-10 VITALS — BP 114/70 | HR 69 | Ht 67.0 in | Wt 169.4 lb

## 2021-09-10 DIAGNOSIS — I25118 Atherosclerotic heart disease of native coronary artery with other forms of angina pectoris: Secondary | ICD-10-CM | POA: Diagnosis not present

## 2021-09-10 DIAGNOSIS — Z951 Presence of aortocoronary bypass graft: Secondary | ICD-10-CM

## 2021-09-10 DIAGNOSIS — Z01810 Encounter for preprocedural cardiovascular examination: Secondary | ICD-10-CM

## 2021-09-10 DIAGNOSIS — I10 Essential (primary) hypertension: Secondary | ICD-10-CM

## 2021-09-10 DIAGNOSIS — E785 Hyperlipidemia, unspecified: Secondary | ICD-10-CM

## 2021-09-10 MED ORDER — METOPROLOL SUCCINATE ER 50 MG PO TB24
50.0000 mg | ORAL_TABLET | Freq: Every day | ORAL | 2 refills | Status: DC
Start: 2021-09-10 — End: 2021-09-18

## 2021-09-10 NOTE — Progress Notes (Signed)
? ?Cardiology Office Note   ? ?Date:  09/10/2021  ? ?ID:  [name], DOB 06-24-1955, MRN [id number] ? ?PCP:  [name], MD  ?Cardiologist: Carlyle Dolly, MD   ? ?Chief Complaint  ?Patient presents with  ? Follow-up  ?  Cardiac Clearance  ? ? ?History of Present Illness:   ? ?[name] is a 66 y.o. male  with past medical history of CAD (s/p PTCA to LCx in 1993, BMS to RCA in 01/2012 with residual 80% OM2 stenosis and medical management recommended, cath in 02/2021 showing multivessel CAD --> s/p CABG with LIMA-LAD, reverse SVG-D1 and SVG-OM), HTN, HLD and COPD who presents to the office today for preoperative cardiac clearance for upcoming endoscopy and colonoscopy. ? ?He was last examined by [name] Dung, NP in 03/2021 for hospital follow-up from his recent CABG and reported feeling fatigued and sore from his recent surgery but denied any recurrent anginal symptoms. He was continued on his current cardiac medications including ASA 81 mg daily, Plavix 75 mg daily, Toprol-XL 50 mg daily, Amlodipine 5 mg daily and Atorvastatin 80 mg daily. ? ?He did call the office in 08/2021 reporting elevated BP readings and his PCP had recently titrated Valsartan to 80 mg daily. His BP had improved but he was experiencing episodes of dizziness, therefore it was recommended that he take Valsartan in the evening hours. In the interim, he was evaluated by GI for worsening GERD and dysphagia with EGD with esophageal dilation and colonoscopy recommended. A follow-up visit was therefore recommended for cardiac clearance.  ? ?In talking with the patient today, he reports having baseline dyspnea on exertion which has been stable since CABG. Did improve some with him having quit smoking (has only smoked 1 or 2 cigarettes since his hospitalization). He reports still having mild pain along his sternal incision but this has improved. No recent palpitations, orthopnea, PND or pitting edema. His dizziness also improved after  switching to taking Valsartan in the evening hours but he still feels "off" at times after taking his AM medications.  ? ? ?Past Medical History:  ?Diagnosis Date  ? Anxiety   ? Anxiety and depression   ? ASCVD (arteriosclerotic cardiovascular disease)   ? a. s/p PTCA to LCx in 1993 b. BMS to RCA in 01/2012 with residual 80% OM2 stenosis and medical management recommended c. 08/2019: cath showing patent stents with occluded OM2 --> medical management. d. s/p CABG in 02/2021 with LIMA-LAD, reverse SVG-D1 and SVG-OM  ? Asthma   ? CAD (coronary artery disease)   ? Carcinoma in situ of colon 2004  ? rectal polyp  ? Colitis, ischemic ([place]) 2011  ? COPD (chronic obstructive pulmonary disease) ([place])   ? mild ;excercise induced hypoxemia by cp stress test ;asthma ,bronchitis,  ? Depression   ? Diverticulosis   ? Gastritis 12/30/2010  ? EGD Dr [name]  ? GERD (gastroesophageal reflux disease)   ? GI bleed   ? H. pylori infection 2004  ? treated  ? Hemorrhoids   ? Hiatal hernia   ? Hyperlipidemia   ? Hypertension   ? Inflammatory polyps of colon with rectal bleeding ([place])   ? Leukocytosis   ? [name]  ? Lung nodule 07/16/2011  ? Myocardial infarction [facility])   ? age 76  ? Restless leg syndrome   ? Schatzki's ring   ? Sleep apnea   ? does not use CPAP:cannot tolerate, PCP aware  ? Syncope   ? Tobacco  abuse   ? 50 pack years continuing at one halp pack daily  ? Tubular adenoma of colon 06/2009  ? Colonosocpy Dr [name]  ? ? ?Past Surgical History:  ?Procedure Laterality Date  ? BIOPSY  12/19/2017  ? Procedure: BIOPSY;  Surgeon: [name] Dolin, MD;  [facility];  Service: Endoscopy;;  gastric  ? CARDIAC CATHETERIZATION    ? CHOLECYSTECTOMY  2004  ? COLONOSCOPY  06/2009  ? normal terminal ileum, segmental mild inflammation of sigmoid colon (bx unremarkable), polyp, tubular adenoma  ? COLONOSCOPY N/A 07/31/2013  ? Dr.Rourk- redundant anal canal hemorrhoids, colonic diverticulosis, tubular adenoma  ? COLONOSCOPY W/  POLYPECTOMY  2004  ? rectal polyp with carcinoma in situ removed via colonoscopy  ? COLONOSCOPY WITH PROPOFOL N/A 09/15/2015  ? Dr. Gala [name]: Scattered diverticula throughout the colon, 2 sessile polyps found in the descending colon and cecum, 5 mm in size.  Cecal polyp was sessile serrated polyp, descending colon polyp was a tubular adenoma.  He had a abnormal perianal exam along with grade 3 hemorrhoids.  Surveillance exam recommended for 5-year follow-up.  ? COLONOSCOPY WITH PROPOFOL N/A 12/19/2017  ? one 5 mm polyp in ascending colon and 1 cm sessile polyp in ascending s/p removal. Tubular adenoma. internal hemorrhoids  ? CORONARY ANGIOPLASTY WITH STENT PLACEMENT  01/26/2012  ? "1; total is now 2"  ? CORONARY ARTERY BYPASS GRAFT N/A 03/13/2021  ? Procedure: CORONARY ARTERY BYPASS GRAFTING (CABG), ON PUMP, TIMES THREE, USING LEFT INTERNAL MAMMARY ARTERY AND RIGHT ENDOSCOPICALLY HARVESTED GREATER SAPHENOUS VEIN;  Surgeon: [name] Matte, MD;  Location: [place];  Service: Open Heart Surgery;  Laterality: N/A;  ? CORONARY/GRAFT ACUTE MI REVASCULARIZATION N/A 03/10/2021  ? Procedure: Coronary/Graft Acute MI Revascularization;  Surgeon: [name] Crome, MD;  Location: [place] CV LAB;  Service: Cardiovascular;  Laterality: N/A;  ? ENDOVEIN HARVEST OF GREATER SAPHENOUS VEIN Right 03/13/2021  ? Procedure: ENDOVEIN HARVEST OF GREATER SAPHENOUS VEIN;  Surgeon: [name] Matte, MD;  Location: [place];  Service: Open Heart Surgery;  Laterality: Right;  ? ESOPHAGOGASTRODUODENOSCOPY  02/2009  ? query Barrett's but bx negative  ? ESOPHAGOGASTRODUODENOSCOPY  12/30/2010  ? [name],gastritis, dilated 28F, sm HH, 1 small ulcer, Duodenal erosions, benign bx  ? ESOPHAGOGASTRODUODENOSCOPY (EGD) WITH ESOPHAGEAL DILATION N/A 09/12/2012  ? [id number] Schatzki's ring s/p Maloney dilator. Small hiatal hernia. negative path  ? ESOPHAGOGASTRODUODENOSCOPY (EGD) WITH ESOPHAGEAL DILATION N/A 07/31/2013  ? Dr. Gala [name]- normal egd, s/p  [facility] dilation empirically. Normal small bowel biopsies   ? ESOPHAGOGASTRODUODENOSCOPY (EGD) WITH PROPOFOL N/A 09/15/2015  ? Dr. Gala [name]: Medium sized hiatal hernia, normal-appearing esophagus status post empiric dilation  ? ESOPHAGOGASTRODUODENOSCOPY (EGD) WITH PROPOFOL N/A 12/19/2017  ? erosive esophagitis s/p dilation, erosive gastropathy, normal duodenum  ? ESOPHAGOGASTRODUODENOSCOPY (EGD) WITH PROPOFOL N/A 09/17/2019  ? Non-obstructing Schatzki's ring s/p dilation, medium-sized hiatal hernia, otherwise normal.  ? FLEXIBLE SIGMOIDOSCOPY  12/30/2010  ?  [name],; internal hemorrhoids, anal papilla  ? HAND SURGERY    ? surgical intervention for injury of the fingers of the left hand many years ago  ? heart stent    ? HEMORRHOID BANDING    ? Dr. Gala [name]  ? LEFT HEART CATH AND CORONARY ANGIOGRAPHY N/A 08/29/2019  ? Procedure: LEFT HEART CATH AND CORONARY ANGIOGRAPHY;  Surgeon: [name], MD;  Location: [place] CV LAB;  Service: Cardiovascular;  Laterality: N/A;  ? LEFT HEART CATH AND CORONARY ANGIOGRAPHY N/A 03/10/2021  ? Procedure: LEFT  HEART CATH AND CORONARY ANGIOGRAPHY;  Surgeon: [name] Crome, MD;  Location: [place] CV LAB;  Service: Cardiovascular;  Laterality: N/A;  ? LEFT HEART CATHETERIZATION WITH CORONARY ANGIOGRAM N/A 01/26/2012  ? Procedure: LEFT HEART CATHETERIZATION WITH CORONARY ANGIOGRAM;  Surgeon: [name], MD;  Location: [facility] CATH LAB;  Service: Cardiovascular;  Laterality: N/A;  ? MALONEY DILATION N/A 09/15/2015  ? Procedure: MALONEY DILATION;  Surgeon: [name] Dolin, MD;  [facility];  Service: Endoscopy;  Laterality: N/A;  ? MALONEY DILATION N/A 12/19/2017  ? Procedure: MALONEY DILATION;  Surgeon: [name] Dolin, MD;  [facility];  Service: Endoscopy;  Laterality: N/A;  ? MALONEY DILATION N/A 09/17/2019  ? Procedure: MALONEY DILATION;  Surgeon: [name] Dolin, MD;  [facility];  Service: Endoscopy;  Laterality: N/A;  ? NASAL SEPTOPLASTY W/  TURBINOPLASTY  10/06/2011  ? Procedure: NASAL SEPTOPLASTY WITH TURBINATE REDUCTION;  Surgeon: Izora Gala, MD;  Location: [place];  Service: ENT;  Laterality: Bilateral;  ? PERCUTANEOUS CORONARY STENT INTERVENTION (PCI-S) N/

## 2021-09-10 NOTE — Patient Instructions (Signed)
Hold Plavix 5 days prior to your Endoscopy/Colonoscopy.  ? ?Take Toprol-XL at night.  ? ?Medication Instructions:  ?Your physician recommends that you continue on your current medications as directed. Please refer to the Current Medication list given to you today. ? ?*If you need a refill on your cardiac medications before your next appointment, please call your pharmacy* ? ? ?Lab Work: ?NONE  ? ?If you have labs (blood work) drawn today and your tests are completely normal, you will receive your results only by: ?MyChart Message (if you have MyChart) OR ?A paper copy in the mail ?If you have any lab test that is abnormal or we need to change your treatment, we will call you to review the results. ? ? ?Testing/Procedures: ?NONE  ? ? ?Follow-Up: ?At [facility], you and your health needs are our priority.  As part of our continuing mission to provide you with exceptional heart care, we have created designated Provider Care Teams.  These Care Teams include your primary Cardiologist (physician) and Advanced Practice Providers (APPs -  Physician Assistants and Nurse Practitioners) who all work together to provide you with the care you need, when you need it. ? ?We recommend signing up for the patient portal called "MyChart".  Sign up information is provided on this After Visit Summary.  MyChart is used to connect with patients for Virtual Visits (Telemedicine).  Patients are able to view lab/test results, encounter notes, upcoming appointments, etc.  Non-urgent messages can be sent to your provider as well.   ?To learn more about what you can do with MyChart, go to [email or web address].   ? ?Your next appointment:   ?6 month(s) ? ?The format for your next appointment:   ?In Person ? ?Provider:   ?Carlyle Dolly, MD  ? ? ?Other Instructions ?Thank you for choosing [facility]! ? ? ? ?

## 2021-09-11 NOTE — Telephone Encounter (Signed)
Received fax of surgical clearance office note clearing patient for recommended procedures. Surgical clearance on your desk for your review.  ?

## 2021-09-14 NOTE — Telephone Encounter (Signed)
?  Colonoscopy/EGD/ED with Dr. [name]. ASA 3, propofol ?Dx: dysphagia, h/o colon cancer, diarrhea, rectal bleeding ?Cardiac clearance documented in chart.  ? ?Do NOT need to hold Plavix. ? ? ? ?

## 2021-09-14 NOTE — Telephone Encounter (Signed)
Noted, will call pt to schedule. ?

## 2021-09-15 ENCOUNTER — Telehealth: Payer: Self-pay | Admitting: Cardiology

## 2021-09-15 NOTE — Telephone Encounter (Signed)
Call placed to [facility], office had already closed.  Will leave in preop callback pool to call. ? ?

## 2021-09-15 NOTE — Telephone Encounter (Signed)
? ?  Pre-operative Risk Assessment  ?  ?Patient Name: [name]  ?DOB: [birth year] ?MRN: [id number]  ? ? ? ?Request for Surgical Clearance   ? ?Procedure:  Dental Extraction - Amount of Teeth to be Pulled:  does not indicate ? ?Date of Surgery:  Clearance TBD                              ?   ?Surgeon:  Dr. Quincy Simmonds  ?Surgeon's Group or Practice Name: [place]   ?Phone number:  [phone] ?Fax number:  [phone] ?  ?Type of Clearance Request ?Pharmacy  ?Does not indicate   ? ?  ?Type of Anesthesia:  Not Indicated ?  ?Additional requests/questions:   ? ?Signed, ?Malanie C Hildebrandt   ?09/15/2021, 3:12 PM  ? ?

## 2021-09-15 NOTE — Telephone Encounter (Signed)
Please clarify the number of teeth being extracted.  Other any other procedures planned other than dental extraction. ? ?Patient is on both aspirin and Plavix.  He was recently cleared on 09/10/2021 to hold Plavix for 5 days prior to GI procedure. ?

## 2021-09-16 ENCOUNTER — Telehealth: Payer: Self-pay | Admitting: Internal Medicine

## 2021-09-16 NOTE — Telephone Encounter (Signed)
LM to call back to schedue ?

## 2021-09-16 NOTE — Telephone Encounter (Signed)
Patient returned call to schedule.

## 2021-09-17 ENCOUNTER — Encounter: Payer: Self-pay | Admitting: *Deleted

## 2021-09-17 MED ORDER — PEG 3350-KCL-NA BICARB-NACL 420 G PO SOLR: ORAL | 0 refills | Status: DC

## 2021-09-17 NOTE — Telephone Encounter (Signed)
I s/w Angie with Dr. Quincy Simmonds, DDS office in order to obtain clarification for the pt's procedure. In speaking with Angie, she tells me that the pt going to have the remainder of his teeth extracted, though not at the same time. I confirmed that is sounds like the pt will have a mult Tx plan in place. I did explain that the cardiologist will not provide a blanket type of clearance, especially when the pt's are on anticoagulants or antiplatelets. This pt is on Plavix and ASA. I did give an example such as if the first Tx is for 3 teeth to be extracted we would assess for clearance. Tx 2 and so would need a new fax  for each Tx plan. Explained that we cannot keep the pt off his Plavix or ASA any longer than absolutely needed, to keep the pt safe. Angie, verbalized understanding and she will d/w Dr. Quincy Simmonds in order for a Tx plan and will fax over the first Tx plan when ready. I gave fax # [phone] attn: Keiandra Sullenger/Pre op team. At this point I will remove from the pre op call back pool at this time until we receive the new clearance request for the first Tx plan. I assured Angie, that I will fax thee notes to their office as FYI as well. I thanked Angie for the help.  ? ?Pt had recent CABG 03/13/21.  ?

## 2021-09-17 NOTE — Telephone Encounter (Signed)
Spoke with pt. He has been scheduled for 5/25 at 12:15pm. Aware will mail prep instructions/pre-op appt. Will send prep rx to pharmacy ? ?PA approved via [employer]here. Tcs auth# [id number], DOS: 11/05/21-02/03/22 ?Egd auth# [id number], DOS: 11/05/21-02/03/22 ?

## 2021-09-17 NOTE — Telephone Encounter (Signed)
See prior note

## 2021-09-17 NOTE — Addendum Note (Signed)
Addended by: Cheron Every on: 09/17/2021 08:45 AM ? ? Modules accepted: Orders ? ?

## 2021-09-18 ENCOUNTER — Other Ambulatory Visit: Payer: Self-pay | Admitting: Cardiology

## 2021-09-21 ENCOUNTER — Ambulatory Visit (INDEPENDENT_AMBULATORY_CARE_PROVIDER_SITE_OTHER): Payer: Medicare HMO | Admitting: Pulmonary Disease

## 2021-09-21 ENCOUNTER — Ambulatory Visit: Payer: Medicare HMO | Admitting: Primary Care

## 2021-09-21 ENCOUNTER — Encounter: Payer: Self-pay | Admitting: Primary Care

## 2021-09-21 DIAGNOSIS — J449 Chronic obstructive pulmonary disease, unspecified: Secondary | ICD-10-CM | POA: Diagnosis not present

## 2021-09-21 DIAGNOSIS — J432 Centrilobular emphysema: Secondary | ICD-10-CM | POA: Diagnosis not present

## 2021-09-21 DIAGNOSIS — R911 Solitary pulmonary nodule: Secondary | ICD-10-CM | POA: Diagnosis not present

## 2021-09-21 DIAGNOSIS — G4733 Obstructive sleep apnea (adult) (pediatric): Secondary | ICD-10-CM

## 2021-09-21 LAB — PULMONARY FUNCTION TEST
DL/VA % pred: 60 %
DL/VA: 2.51 ml/min/mmHg/L
DLCO unc % pred: 53 %
DLCO unc: 14.27 ml/min/mmHg
FEF 25-75 Post: 1.19 L/sec
FEF 25-75 Pre: 0.88 L/sec
FEF2575-%Change-Post: 35 %
FEF2575-%Pred-Post: 43 %
FEF2575-%Pred-Pre: 32 %
FEV1-%Change-Post: 5 %
FEV1-%Pred-Post: 69 %
FEV1-%Pred-Pre: 65 %
FEV1-Post: 2.39 L
FEV1-Pre: 2.27 L
FEV1FVC-%Change-Post: 5 %
FEV1FVC-%Pred-Pre: 79 %
FEV6-%Change-Post: 6 %
FEV6-%Pred-Post: 84 %
FEV6-%Pred-Pre: 79 %
FEV6-Post: 3.7 L
FEV6-Pre: 3.49 L
FEV6FVC-%Change-Post: 6 %
FEV6FVC-%Pred-Post: 102 %
FEV6FVC-%Pred-Pre: 96 %
FVC-%Change-Post: 0 %
FVC-%Pred-Post: 82 %
FVC-%Pred-Pre: 82 %
FVC-Post: 3.8 L
FVC-Pre: 3.81 L
Post FEV1/FVC ratio: 63 %
Post FEV6/FVC ratio: 97 %
Pre FEV1/FVC ratio: 60 %
Pre FEV6/FVC Ratio: 92 %
RV % pred: 106 %
RV: 2.49 L
TLC % pred: 92 %
TLC: 6.45 L

## 2021-09-21 MED ORDER — TRELEGY ELLIPTA 100-62.5-25 MCG/ACT IN AEPB: 1.0000 | INHALATION_SPRAY | Freq: Every day | RESPIRATORY_TRACT | 3 refills | Status: DC

## 2021-09-21 NOTE — Progress Notes (Signed)
PFT done today. 

## 2021-09-21 NOTE — Assessment & Plan Note (Signed)
-   Referred to lung cancer screening program  ?

## 2021-09-21 NOTE — Assessment & Plan Note (Addendum)
-   Former smoker, quit October 2022. PFTs today showed moderate obstructive airway disease and moderate diffusion defect (FEV1 69%, DLCOunc 53%) .No bronchodilator response. Minimal change in FEV1 since 2014. 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 No oxygen desaturations on simple walk test during last visit. Continue Trelegy 13mg one puff daily and prn albuterol hfa. Advised he stay active and avoid tobacco smoke. FU in 3 months or sooner if needed.  ?

## 2021-09-21 NOTE — Progress Notes (Signed)
? ?'@Patient'$  ID: [name], male    DOB: 12/21/1955, 66 y.o.   MRN: [id number] ? ?Chief Complaint  ?Patient presents with  ? Follow-up  ?  Pt is here for follow up. Pt had a full pft done this morning. Pt states breathing is doing well. Pt is on Trelegy 100 and albuterol.   ? ? ?Referring provider: ?[name], MD ? ?HPI: ?66 year old male, former smoker quit in October 2022 (48-pack-year history).  Medical history significant for COPD GOLD 0, hiatal hernia, lung nodule, OSA, HTN, STEMI, CAD s/p CABG, hyperlipidemia, chronic pain syndrome, depression with anxiety.  Patient of [name], last seen in office on 08/12/2021 for snoring. ? ?Previous [facility] pulmonary encounter: ?08/12/21- Dr. [name], consult  ?He started smoking in 1971.  He smoked 1 ppd and quit in October 2022 after he had CABG.  His brother has emphysema.  His PFT from 2013 showed mild obstruction and moderate diffusion defect.  CT chest from 2014 showed bullous emphysema. ?  ?He has been using symbicort.  He was on trelegy before and this worked better.  Has cough at night and wakes him up.  Has snoring and stops breathing at night.  Feels tired during the day, and can fall asleep when sitting.  He gets winded walking about 100 feet.  SpO2 on room air while walking today stayed above 92%.  Has occasional wheezing.  Not having fever, leg swelling, or hemoptysis.  No prior history of pneumonia. ? ?09/21/2021 ?Patient presents today for 1 month follow-up with PFTs. Patient has hx mild COPD and bullous emphysema. During his last office visit he was ordered for PFTs and HST. He was also started on Trelegy and referred to lung cancer screening program. Alpha 1 level was normal, MM 150. He has not yet had home sleep study. PFTs today showed moderate obstructive lung disease. He feels trelegy seems to help more than symbicort. No acute symptoms except for seasonal allergies.  ? ?Pulmonary function testing: ?09/21/2021 >> FVC 3.80 (82%), FEV1 2.39 (69%), ratio 63, TLC  92%, DLCOnc 53%  ? ? ?No Known Allergies ? ?Immunization History  ?Administered Date(s) Administered  ? Influenza Inj Mdck Quad Pf 06/21/2019  ? Influenza,inj,Quad PF,6+ Mos 04/10/2013, 03/18/2014, 04/23/2015, 04/21/2016, 05/19/2017, 03/30/2018, 04/17/2020, 07/07/2021  ? Influenza-Unspecified 05/14/2011, 06/21/2019  ? Moderna Sars-Covid-2 Vaccination 07/17/2019, 08/13/2019  ? Pneumococcal Polysaccharide-23 11/15/2017  ? Pneumococcal-Unspecified 04/10/1997  ? Td 10/23/2013  ? ? ?Past Medical History:  ?Diagnosis Date  ? Anxiety   ? Anxiety and depression   ? ASCVD (arteriosclerotic cardiovascular disease)   ? a. s/p PTCA to LCx in 1993 b. BMS to RCA in 01/2012 with residual 80% OM2 stenosis and medical management recommended c. 08/2019: cath showing patent stents with occluded OM2 --> medical management. d. s/p CABG in 02/2021 with LIMA-LAD, reverse SVG-D1 and SVG-OM  ? Asthma   ? CAD (coronary artery disease)   ? Carcinoma in situ of colon 2004  ? rectal polyp  ? Colitis, ischemic ([place]) 2011  ? COPD (chronic obstructive pulmonary disease) ([place])   ? mild ;excercise induced hypoxemia by cp stress test ;asthma ,bronchitis,  ? Depression   ? Diverticulosis   ? Gastritis 12/30/2010  ? EGD Dr [name]  ? GERD (gastroesophageal reflux disease)   ? GI bleed   ? H. pylori infection 2004  ? treated  ? Hemorrhoids   ? Hiatal hernia   ? Hyperlipidemia   ? Hypertension   ? Inflammatory polyps of colon with  rectal bleeding ([place])   ? Leukocytosis   ? [name]  ? Lung nodule 07/16/2011  ? Myocardial infarction [facility])   ? age 68  ? Restless leg syndrome   ? Schatzki's ring   ? Sleep apnea   ? does not use CPAP:cannot tolerate, PCP aware  ? Syncope   ? Tobacco abuse   ? 50 pack years continuing at one halp pack daily  ? Tubular adenoma of colon 06/2009  ? Colonosocpy Dr [name]  ? ? ?Tobacco History: ?Social History  ? ?Tobacco Use  ?Smoking Status Former  ? Packs/day: 1.00  ? Years: 48.00  ? Pack years: 48.00  ? Types: Cigarettes   ? Start date: 07/31/1969  ? Quit date: 03/16/2021  ? Years since quitting: 0.5  ?Smokeless Tobacco Former  ? ?Counseling given: Not Answered ? ? ?Outpatient Medications Prior to Visit  ?Medication Sig Dispense Refill  ? albuterol (VENTOLIN HFA) 108 (90 Base) MCG/ACT inhaler Inhale 2 puffs into the lungs every 4 (four) hours as needed for wheezing or shortness of breath. 18 g 1  ? ALPRAZolam (XANAX) 0.5 MG tablet Take one twice a day and two at bedtime 120 tablet 2  ? amLODipine (NORVASC) 5 MG tablet 1 take tablet ('5mg'$ ) by mouth Once a day. 90 tablet 1  ? aspirin EC 81 MG tablet Take 81 mg by mouth daily.    ? atorvastatin (LIPITOR) 80 MG tablet TAKE 1 TABLET BY MOUTH EVERY DAY 90 tablet 3  ? clopidogrel (PLAVIX) 75 MG tablet Take 1 tablet (75 mg total) by mouth daily. 30 tablet 11  ? cyanocobalamin 2000 MCG tablet Take 2,000 mcg by mouth daily.    ? FLUoxetine (PROZAC) 20 MG capsule Take 1 capsule (20 mg total) by mouth daily. Take with '40mg'$  for a total daily dose of '60mg'$  90 capsule 2  ? FLUoxetine (PROZAC) 40 MG capsule Take 1 capsule (40 mg total) by mouth daily. Take with '20mg'$  for a total daily dose of '60mg'$  90 capsule 2  ? gabapentin (NEURONTIN) 300 MG capsule Take 2 capsules (600 mg total) by mouth 3 (three) times daily. 540 capsule 1  ? HYDROcodone-acetaminophen (NORCO/VICODIN) 5-325 MG tablet 1 twice daily as needed 45 tablet 0  ? isosorbide mononitrate (IMDUR) 30 MG 24 hr tablet Take 30 mg by mouth daily.    ? lipase/protease/amylase (CREON) 36000 UNITS CPEP capsule Take 2 capsules (72,000 Units total) by mouth 3 (three) times daily with meals. May also take 1 capsule (36,000 Units total) as needed (with snacks). 240 capsule 3  ? meloxicam (MOBIC) 15 MG tablet Take 15 mg by mouth daily.    ? metoprolol succinate (TOPROL-XL) 50 MG 24 hr tablet TAKE 1 TABLET BY MOUTH DAILY 90 tablet 1  ? nitroGLYCERIN (NITROSTAT) 0.4 MG SL tablet Place 1 tablet (0.4 mg total) under the tongue every 5 (five) minutes as needed for  chest pain. 20 tablet 5  ? OLANZapine (ZYPREXA) 10 MG tablet Take 1 tablet (10 mg total) by mouth at bedtime. 90 tablet 2  ? pantoprazole (PROTONIX) 40 MG tablet TAKE 1 TABLET BY MOUTH TWICE A DAY BEFORE A MEAL 180 tablet 3  ? polyethylene glycol-electrolytes (NULYTELY) 420 g solution As directed 4000 mL 0  ? rOPINIRole (REQUIP) 2 MG tablet TAKE 1 TABLET BY MOUTH EVERYDAY AT BEDTIME 90 tablet 1  ? tamsulosin (FLOMAX) 0.4 MG CAPS capsule Take 1 capsule (0.4 mg total) by mouth daily. 90 capsule 1  ? traZODone (DESYREL) 50  MG tablet TAKE 1 TABLET BY MOUTH EVERYDAY AT BEDTIME 90 tablet 1  ? valsartan (DIOVAN) 80 MG tablet Take 1 tablet (80 mg total) by mouth daily. 90 tablet 1  ? Fluticasone-Umeclidin-Vilant (TRELEGY ELLIPTA) 100-62.5-25 MCG/ACT AEPB Inhale 1 puff into the lungs daily. 180 each 3  ? ?No facility-administered medications prior to visit.  ? ? ?Review of Systems ? ?Review of Systems  ?Constitutional: Negative.   ?HENT: Negative.    ?Respiratory:  Positive for cough. Negative for chest tightness, shortness of breath and wheezing.   ? ? ?Physical Exam ? ?BP 120/68 (BP Location: Left Arm, Patient Position: Sitting, Cuff Size: Normal)   Pulse 84   Temp 98.7 ?F (37.1 ?C) (Oral)   Ht '5\' 10"'$  (1.778 m)   Wt 168 lb 6.4 oz (76.4 kg)   SpO2 94%   BMI 24.16 kg/m?  ?Physical Exam ?Constitutional:   ?   Appearance: Normal appearance.  ?HENT:  ?   Head: Normocephalic and atraumatic.  ?Cardiovascular:  ?   Rate and Rhythm: Normal rate and regular rhythm.  ?Pulmonary:  ?   Effort: Pulmonary effort is normal.  ?   Breath sounds: Normal breath sounds.  ?Musculoskeletal:     ?   General: Normal range of motion.  ?Skin: ?   General: Skin is warm and dry.  ?Neurological:  ?   General: No focal deficit present.  ?   Mental Status: He is alert and oriented to person, place, and time. Mental status is at baseline.  ?  ? ?Lab Results: ? ?CBC ?   ?Component Value Date/Time  ? WBC 7.2 07/21/2021 0948  ? WBC 11.4 (H) 03/17/2021  0552  ? RBC 5.90 (H) 07/21/2021 0948  ? RBC 4.21 (L) 03/17/2021 0552  ? HGB 16.9 07/21/2021 0948  ? HCT 49.9 07/21/2021 0948  ? PLT 243 07/21/2021 0948  ? MCV 85 07/21/2021 0948  ? MCH 28.6 07/21/2021 094

## 2021-09-21 NOTE — Patient Instructions (Addendum)
Pulmonary function testing showed that you have stage II COPD, this is only slightly worsened since 2014.  Moderate emphysema.  Alpha-1 genetic testing was normal. ? ?Recommendations: ?Continue Trelegy 1 puff daily (rinse mouth after use) ?Use Albuterol 2 puffs every 4-6 hours as needed for shortness of breath or wheezing ?Stay as active as possible  ?Avoid nicotine products/smoking  ?We will follow-up on scheduling sleep study  ? ?RX: ?Refill Trelegy sent to mail in pharmacy  ? ?Follow-up: ?3 months with Dr. [name] or sooner if needed  ? ? ?COPD and Physical Activity ?Chronic obstructive pulmonary disease (COPD) is a long-term, or chronic, condition that affects the lungs. COPD is a general term that can be used to describe many problems that cause inflammation of the lungs and limit airflow. These conditions include chronic bronchitis and emphysema. ?The main symptom of COPD is shortness of breath, which makes it harder to do even simple tasks. This can also make it harder to exercise and stay active. Talk with your health care provider about treatments to help you breathe better and actions you can take to prevent breathing problems during physical activity. ?What are the benefits of exercising when you have COPD? ?Exercising regularly is an important part of a healthy lifestyle. You can still exercise and do physical activities even though you have COPD. Exercise and physical activity improve your shortness of breath by increasing blood flow (circulation). This causes your heart to pump more oxygen through your body. Moderate exercise can: ?Improve oxygen use. ?Increase your energy level. ?Help with shortness of breath. ?Strengthen your breathing muscles. ?Improve heart health. ?Help with sleep. ?Improve your self-esteem and feelings of self-worth. ?Lower depression, stress, and anxiety. ?Exercise can benefit everyone with COPD. The severity of your disease may affect how hard you can exercise, especially at  first, but everyone can benefit. Talk with your health care provider about how much exercise is safe for you, and which activities and exercises are safe for you. ?What actions can I take to prevent breathing problems during physical activity? ?Sign up for a pulmonary rehabilitation program. This type of program may include: ?Education about lung diseases. ?Exercise classes that teach you how to exercise and be more active while improving your breathing. This usually involves: ?Exercise using your lower extremities, such as a stationary bicycle. ?About 30 minutes of exercise, 2 to 5 times per week, for 6 to 12 weeks. ?Strength training, such as push-ups or leg lifts. ?Nutrition education. ?Group classes in which you can talk with others who also have COPD and learn ways to manage stress. ?If you use an oxygen tank, you should use it while you exercise. Work with your health care provider to adjust your oxygen for your physical activity. Your resting flow rate is different from your flow rate during physical activity. ?How to manage your breathing while exercising ?While you are exercising: ?Take slow breaths. ?Pace yourself, and do nottry to go too fast. ?Purse your lips while breathing out. Pursing your lips is similar to a kissing or whistling position. ?If doing exercise that uses a quick burst of effort, such as weight lifting: ?Breathe in before starting the exercise. ?Breathe out during the hardest part of the exercise, such as raising the weights. ?Where to find support ?You can find support for exercising with COPD from: ?Your health care provider. ?A pulmonary rehabilitation program. ?Your local health department or community health programs. ?Support groups, either online or in-person. Your health care provider may be able to  recommend support groups. ?Where to find more information ?You can find more information about exercising with COPD from: ?American Lung Association: lung.org ?COPD Foundation:  copdfoundation.org ?Contact a health care provider if: ?Your symptoms get worse. ?You have nausea. ?You have a fever. ?You want to start a new exercise program or a new activity. ?Get help right away if: ?You have chest pain. ?You cannot breathe. ?These symptoms may represent a serious problem that is an emergency. Do not wait to see if the symptoms will go away. Get medical help right away. Call your local emergency services (911 in the [place] Do not drive yourself to the hospital. ?Summary ?COPD is a general term that can be used to describe many different lung problems that cause lung inflammation and limit airflow. This includes chronic bronchitis and emphysema. ?Exercise and physical activity improve your shortness of breath by increasing blood flow (circulation). This causes your heart to provide more oxygen to your body. ?Contact your health care provider before starting any exercise program or new activity. Ask your health care provider what exercises and activities are safe for you. ?This information is not intended to replace advice given to you by your health care provider. Make sure you discuss any questions you have with your health care provider. ?Document Revised: 04/08/2020 Document Reviewed: 04/08/2020 ?Elsevier Patient Education ? [place]. ? ?

## 2021-09-21 NOTE — Assessment & Plan Note (Signed)
-   Awaiting HST to be scheduled  ?

## 2021-09-22 ENCOUNTER — Encounter: Payer: Self-pay | Admitting: Family Medicine

## 2021-09-22 ENCOUNTER — Ambulatory Visit: Payer: Medicare HMO | Admitting: Family Medicine

## 2021-09-22 NOTE — Progress Notes (Signed)
Reviewed and agree with assessment/plan. ? ? ?Chesley Mires, MD ?[facility] ?09/22/2021, 12:14 PM ?Pager:  [phone] ? ?

## 2021-09-28 ENCOUNTER — Telehealth: Payer: Self-pay

## 2021-09-28 ENCOUNTER — Other Ambulatory Visit: Payer: Self-pay | Admitting: Family Medicine

## 2021-09-28 MED ORDER — MELOXICAM 15 MG PO TABS: 15.0000 mg | ORAL_TABLET | Freq: Every day | ORAL | 1 refills | Status: DC

## 2021-09-28 MED ORDER — HYDROCODONE-ACETAMINOPHEN 5-325 MG PO TABS: ORAL_TABLET | ORAL | 0 refills | Status: DC

## 2021-09-28 NOTE — Telephone Encounter (Signed)
Refills were sent to the pharmacy is listed as requested ?Please keep follow-up visit ?

## 2021-09-28 NOTE — Telephone Encounter (Signed)
Telephone call no answer 

## 2021-09-28 NOTE — Telephone Encounter (Signed)
Encourage patient to contact the pharmacy for refills or they can request refills through [facility] ? ?(Please schedule appointment if patient has not been seen in over a year) ? ? ? ?WHAT PHARMACY WOULD THEY LIKE THIS SENT TO: [facility], [place]  ?[address] [place] [address] for Hydrocodone  ?meloxicam (MOBIC) 15 MG tablet  ?Sent to [facility], [place], [place] [address]  ? ? ?MEDICATION NAME & DOSE:HYDROcodone-acetaminophen (NORCO/VICODIN) 5-325 MG tablet [[id number]] meloxicam (MOBIC) 15 MG tablet  ? ? ? ?NOTES/COMMENTS FROM PATIENT:Pt has upcoming appt May 9th '@1'$ :50 ? ? ? ? ? ?[facility] office please notify patient: ?It takes 48-72 hours to process rx refill requests ?Ask patient to call pharmacy to ensure rx is ready before heading there.  ? ?

## 2021-09-30 ENCOUNTER — Telehealth: Payer: Self-pay

## 2021-09-30 NOTE — Telephone Encounter (Signed)
Patient will call the office tomorrow, 10/01/21 to schedule. ?

## 2021-09-30 NOTE — Telephone Encounter (Signed)
? ?  Pre-operative Risk Assessment  ?  ?Patient Name: [name]  ?DOB: [birth year] ?MRN: [id number]  ? ? ? ?Request for Surgical Clearance   ? ?Procedure:  Dental Extraction - Amount of Teeth to be Pulled:  4 ? ?Date of Surgery:  Clearance TBD                              ?   ?Surgeon:  Dr Bertis Ruddy ?Surgeon's Group or Practice Name:  [facility] ?Phone number:  [phone] ?Fax number:  [phone] ?  ?Type of Clearance Requested:   ?- Medical  ?- Pharmacy:  Hold Aspirin and Clopidogrel (Plavix) Patient on Blood Thinners. ?  ?Type of Anesthesia:  Not Indicated ?  ?Additional requests/questions:   ? ?Signed, ?Alizandra Loh   ?09/30/2021, 9:58 AM  ? ?

## 2021-09-30 NOTE — Telephone Encounter (Signed)
? ? ?  Name: [name]  ?DOB: [birth year]  ?MRN: [id number] ? ?Primary Cardiologist: Carlyle Dolly, MD ? ? ?Preoperative team, please contact this patient and set up a phone call appointment for further preoperative risk assessment. Please obtain consent and complete medication review. Thank you for your help. ? ?I confirm that guidance regarding antiplatelet and oral anticoagulation therapy has been completed and, if necessary, noted below. ? ?Previously cleared to hold plavix 5 days for surgery. Given CABG, recommend continuing ASA throughout perioperative period. ? ?Ledora Bottcher, PA ?09/30/2021, 4:22 PM ?[facility] ?[address] ?[place], [place] [address] ? ? ?

## 2021-10-02 ENCOUNTER — Telehealth: Payer: Self-pay | Admitting: *Deleted

## 2021-10-02 NOTE — Telephone Encounter (Signed)
Tele pre op appt 10/05/21. Med rec and consent are done.  ? ?  ?Patient Consent for Virtual Visit  ? ? ?   ? ?[name] has provided verbal consent on 10/02/2021 for a virtual visit (video or telephone). ? ? ?CONSENT FOR VIRTUAL VISIT FOR:  [name]  ?By participating in this virtual visit I agree to the following: ? ?I hereby voluntarily request, consent and authorize [facility] and its employed or contracted physicians, physician assistants, nurse practitioners or other licensed health care professionals (the Practitioner), to provide me with telemedicine health care services (the ?Services") as deemed necessary by the treating Practitioner. I acknowledge and consent to receive the Services by the Practitioner via telemedicine. I understand that the telemedicine visit will involve communicating with the Practitioner through live audiovisual communication technology and the disclosure of certain medical information by electronic transmission. I acknowledge that I have been given the opportunity to request an in-person assessment or other available alternative prior to the telemedicine visit and am voluntarily participating in the telemedicine visit. ? ?I understand that I have the right to withhold or withdraw my consent to the use of telemedicine in the course of my care at any time, without affecting my right to future care or treatment, and that the Practitioner or I may terminate the telemedicine visit at any time. I understand that I have the right to inspect all information obtained and/or recorded in the course of the telemedicine visit and may receive copies of available information for a reasonable fee.  I understand that some of the potential risks of receiving the Services via telemedicine include:  ?Delay or interruption in medical evaluation due to technological equipment failure or disruption; ?Information transmitted may not be sufficient (e.g. poor resolution of images) to allow for  appropriate medical decision making by the Practitioner; and/or  ?In rare instances, security protocols could fail, causing a breach of personal health information. ? ?Furthermore, I acknowledge that it is my responsibility to provide information about my medical history, conditions and care that is complete and accurate to the best of my ability. I acknowledge that Practitioner's advice, recommendations, and/or decision may be based on factors not within their control, such as incomplete or inaccurate data provided by me or distortions of diagnostic images or specimens that may result from electronic transmissions. I understand that the practice of medicine is not an exact science and that Practitioner makes no warranties or guarantees regarding treatment outcomes. I acknowledge that a copy of this consent can be made available to me via my patient portal ([facility]), or I can request a printed copy by calling the office of [facility].   ? ?I understand that my insurance will be billed for this visit.  ? ?I have read or had this consent read to me. ?I understand the contents of this consent, which adequately explains the benefits and risks of the Services being provided via telemedicine.  ?I have been provided ample opportunity to ask questions regarding this consent and the Services and have had my questions answered to my satisfaction. ?I give my informed consent for the services to be provided through the use of telemedicine in my medical care ? ? ? ?

## 2021-10-02 NOTE — Telephone Encounter (Signed)
Pt wife contacted Shonna Chock) and verbalized understanding  ?

## 2021-10-02 NOTE — Telephone Encounter (Signed)
Tele pre op appt 10/05/21. Med rec and consent are done.  ?

## 2021-10-05 ENCOUNTER — Ambulatory Visit (INDEPENDENT_AMBULATORY_CARE_PROVIDER_SITE_OTHER): Payer: Medicare HMO | Admitting: Physician Assistant

## 2021-10-05 DIAGNOSIS — Z01810 Encounter for preprocedural cardiovascular examination: Secondary | ICD-10-CM

## 2021-10-05 NOTE — Progress Notes (Signed)
? ?Virtual Visit via Telephone Note  ? ?This visit type was conducted due to national recommendations for restrictions regarding the COVID-19 Pandemic (e.g. social distancing) in an effort to limit this patient's exposure and mitigate transmission in our community.  Due to his co-morbid illnesses, this patient is at least at moderate risk for complications without adequate follow up.  This format is felt to be most appropriate for this patient at this time.  The patient did not have access to video technology/had technical difficulties with video requiring transitioning to audio format only (telephone).  All issues noted in this document were discussed and addressed.  No physical exam could be performed with this format.  Please refer to the patient's chart for his  consent to telehealth for [facility]. ? ?Evaluation Performed:  Preoperative cardiovascular risk assessment ?_____________  ? ?Date:  10/05/2021  ? ?Patient ID:  [name], [name] 06-10-1956, MRN [id number] ?Patient Location:  ?Home ?Provider location:   ?Office ? ?Primary Care Provider:  [name], MD ?Primary Cardiologist:  Carlyle Dolly, MD ? ?Chief Complaint  ?  ?66 y.o. y/o male with a h/o CAD s/p CABG, HTN, HLD, and COD, who is pending four dental extractions, and presents today for telephonic preoperative cardiovascular risk assessment. ? ?Past Medical History  ?  ?Past Medical History:  ?Diagnosis Date  ? Anxiety   ? Anxiety and depression   ? ASCVD (arteriosclerotic cardiovascular disease)   ? a. s/p PTCA to LCx in 1993 b. BMS to RCA in 01/2012 with residual 80% OM2 stenosis and medical management recommended c. 08/2019: cath showing patent stents with occluded OM2 --> medical management. d. s/p CABG in 02/2021 with LIMA-LAD, reverse SVG-D1 and SVG-OM  ? Asthma   ? CAD (coronary artery disease)   ? Carcinoma in situ of colon 2004  ? rectal polyp  ? Colitis, ischemic ([place]) 2011  ? COPD (chronic obstructive pulmonary disease) ([place])    ? mild ;excercise induced hypoxemia by cp stress test ;asthma ,bronchitis,  ? Depression   ? Diverticulosis   ? Gastritis 12/30/2010  ? EGD Dr [name]  ? GERD (gastroesophageal reflux disease)   ? GI bleed   ? H. pylori infection 2004  ? treated  ? Hemorrhoids   ? Hiatal hernia   ? Hyperlipidemia   ? Hypertension   ? Inflammatory polyps of colon with rectal bleeding ([place])   ? Leukocytosis   ? [name]  ? Lung nodule 07/16/2011  ? Myocardial infarction [facility])   ? age 66  ? Restless leg syndrome   ? Schatzki's ring   ? Sleep apnea   ? does not use CPAP:cannot tolerate, PCP aware  ? Syncope   ? Tobacco abuse   ? 50 pack years continuing at one halp pack daily  ? Tubular adenoma of colon 06/2009  ? Colonosocpy Dr [name]  ? ?Past Surgical History:  ?Procedure Laterality Date  ? BIOPSY  12/19/2017  ? Procedure: BIOPSY;  Surgeon: [name], MD;  [facility];  Service: Endoscopy;;  gastric  ? CARDIAC CATHETERIZATION    ? CHOLECYSTECTOMY  2004  ? COLONOSCOPY  06/2009  ? normal terminal ileum, segmental mild inflammation of sigmoid colon (bx unremarkable), polyp, tubular adenoma  ? COLONOSCOPY N/A 07/31/2013  ? Dr.Rourk- redundant anal canal hemorrhoids, colonic diverticulosis, tubular adenoma  ? COLONOSCOPY W/ POLYPECTOMY  2004  ? rectal polyp with carcinoma in situ removed via colonoscopy  ? COLONOSCOPY WITH PROPOFOL N/A 09/15/2015  ?  Dr. [name]: Scattered diverticula throughout the colon, 2 sessile polyps found in the descending colon and cecum, 5 mm in size.  Cecal polyp was sessile serrated polyp, descending colon polyp was a tubular adenoma.  He had a abnormal perianal exam along with grade 3 hemorrhoids.  Surveillance exam recommended for 5-year follow-up.  ? COLONOSCOPY WITH PROPOFOL N/A 12/19/2017  ? one 5 mm polyp in ascending colon and 1 cm sessile polyp in ascending s/p removal. Tubular adenoma. internal hemorrhoids  ? CORONARY ANGIOPLASTY WITH STENT PLACEMENT  01/26/2012  ? "1; total is now 2"  ?  CORONARY ARTERY BYPASS GRAFT N/A 03/13/2021  ? Procedure: CORONARY ARTERY BYPASS GRAFTING (CABG), ON PUMP, TIMES THREE, USING LEFT INTERNAL MAMMARY ARTERY AND RIGHT ENDOSCOPICALLY HARVESTED GREATER SAPHENOUS VEIN;  Surgeon: Lajuana Matte, MD;  Location: [place];  Service: Open Heart Surgery;  Laterality: N/A;  ? CORONARY/GRAFT ACUTE MI REVASCULARIZATION N/A 03/10/2021  ? Procedure: Coronary/Graft Acute MI Revascularization;  Surgeon: Belva Crome, MD;  Location: [place] CV LAB;  Service: Cardiovascular;  Laterality: N/A;  ? ENDOVEIN HARVEST OF GREATER SAPHENOUS VEIN Right 03/13/2021  ? Procedure: ENDOVEIN HARVEST OF GREATER SAPHENOUS VEIN;  Surgeon: Lajuana Matte, MD;  Location: [place];  Service: Open Heart Surgery;  Laterality: Right;  ? ESOPHAGOGASTRODUODENOSCOPY  02/2009  ? query Barrett's but bx negative  ? ESOPHAGOGASTRODUODENOSCOPY  12/30/2010  ? [name],gastritis, dilated 29F, sm HH, 1 small ulcer, Duodenal erosions, benign bx  ? ESOPHAGOGASTRODUODENOSCOPY (EGD) WITH ESOPHAGEAL DILATION N/A 09/12/2012  ? OIN:OMVEHMCNOBS Schatzki's ring s/p Maloney dilator. Small hiatal hernia. negative path  ? ESOPHAGOGASTRODUODENOSCOPY (EGD) WITH ESOPHAGEAL DILATION N/A 07/31/2013  ? Dr. [name]- normal egd, s/p [facility] dilation empirically. Normal small bowel biopsies   ? ESOPHAGOGASTRODUODENOSCOPY (EGD) WITH PROPOFOL N/A 09/15/2015  ? Dr. [name]: Medium sized hiatal hernia, normal-appearing esophagus status post empiric dilation  ? ESOPHAGOGASTRODUODENOSCOPY (EGD) WITH PROPOFOL N/A 12/19/2017  ? erosive esophagitis s/p dilation, erosive gastropathy, normal duodenum  ? ESOPHAGOGASTRODUODENOSCOPY (EGD) WITH PROPOFOL N/A 09/17/2019  ? Non-obstructing Schatzki's ring s/p dilation, medium-sized hiatal hernia, otherwise normal.  ? FLEXIBLE SIGMOIDOSCOPY  12/30/2010  ?  [name],; internal hemorrhoids, anal papilla  ? HAND SURGERY    ? surgical intervention for injury of the fingers of the left hand many years ago  ?  heart stent    ? HEMORRHOID BANDING    ? Dr. [name]  ? LEFT HEART CATH AND CORONARY ANGIOGRAPHY N/A 08/29/2019  ? Procedure: LEFT HEART CATH AND CORONARY ANGIOGRAPHY;  Surgeon: [name], MD;  Location: [place] CV LAB;  Service: Cardiovascular;  Laterality: N/A;  ? LEFT HEART CATH AND CORONARY ANGIOGRAPHY N/A 03/10/2021  ? Procedure: LEFT HEART CATH AND CORONARY ANGIOGRAPHY;  Surgeon: Belva Crome, MD;  Location: [place] CV LAB;  Service: Cardiovascular;  Laterality: N/A;  ? LEFT HEART CATHETERIZATION WITH CORONARY ANGIOGRAM N/A 01/26/2012  ? Procedure: LEFT HEART CATHETERIZATION WITH CORONARY ANGIOGRAM;  Surgeon: [name], MD;  Location: [facility] CATH LAB;  Service: Cardiovascular;  Laterality: N/A;  ? MALONEY DILATION N/A 09/15/2015  ? Procedure: MALONEY DILATION;  Surgeon: [name], MD;  [facility];  Service: Endoscopy;  Laterality: N/A;  ? MALONEY DILATION N/A 12/19/2017  ? Procedure: MALONEY DILATION;  Surgeon: [name], MD;  [facility];  Service: Endoscopy;  Laterality: N/A;  ? MALONEY DILATION N/A 09/17/2019  ? Procedure: MALONEY DILATION;  Surgeon: [name], MD;  [facility];  Service: Endoscopy;  Laterality: N/A;  ? NASAL SEPTOPLASTY W/ TURBINOPLASTY  10/06/2011  ? Procedure: NASAL SEPTOPLASTY WITH TURBINATE REDUCTION;  Surgeon: Izora Gala, MD;  Location: [place];  Service: ENT;  Laterality: Bilateral;  ? PERCUTANEOUS CORONARY STENT INTERVENTION (PCI-S) N/A 01/26/2012  ? Procedure: PERCUTANEOUS CORONARY STENT INTERVENTION (PCI-S);  Surgeon: [name], MD;  Location: [facility] CATH LAB;  Service: Cardiovascular;  Laterality: N/A;  ? POLYPECTOMY  09/15/2015  ? Procedure: POLYPECTOMY;  Surgeon: [name], MD;  [facility];  Service: Endoscopy;;  Cecal polyp removed via cold snare/ Descending colon polyp removed via cold snare  ? POLYPECTOMY  12/19/2017  ? Procedure: POLYPECTOMY;  Surgeon: [name], MD;  [facility];   Service: Endoscopy;;  colon  ? TEE WITHOUT CARDIOVERSION N/A 03/13/2021  ? Procedure: TRANSESOPHAGEAL ECHOCARDIOGRAM (TEE);  Surgeon: Lajuana Matte, MD;  Location: [place];  Service: Open Heart Surger

## 2021-10-06 ENCOUNTER — Ambulatory Visit (HOSPITAL_COMMUNITY)
Admission: RE | Admit: 2021-10-06 | Discharge: 2021-10-06 | Disposition: A | Discharge status: Home / Self Care | Payer: Medicare HMO | Source: Ambulatory Visit | Attending: Family Medicine | Admitting: Family Medicine

## 2021-10-06 DIAGNOSIS — R911 Solitary pulmonary nodule: Secondary | ICD-10-CM | POA: Insufficient documentation

## 2021-10-06 DIAGNOSIS — J449 Chronic obstructive pulmonary disease, unspecified: Secondary | ICD-10-CM | POA: Diagnosis not present

## 2021-10-06 DIAGNOSIS — R918 Other nonspecific abnormal finding of lung field: Secondary | ICD-10-CM | POA: Diagnosis not present

## 2021-10-20 ENCOUNTER — Ambulatory Visit (INDEPENDENT_AMBULATORY_CARE_PROVIDER_SITE_OTHER): Payer: Medicare HMO | Admitting: Family Medicine

## 2021-10-20 VITALS — BP 129/85 | HR 68 | Temp 98.1°F | Ht 70.0 in | Wt 175.6 lb

## 2021-10-20 DIAGNOSIS — G894 Chronic pain syndrome: Secondary | ICD-10-CM | POA: Diagnosis not present

## 2021-10-20 DIAGNOSIS — E782 Mixed hyperlipidemia: Secondary | ICD-10-CM | POA: Diagnosis not present

## 2021-10-20 DIAGNOSIS — Z23 Encounter for immunization: Secondary | ICD-10-CM

## 2021-10-20 DIAGNOSIS — I10 Essential (primary) hypertension: Secondary | ICD-10-CM

## 2021-10-20 MED ORDER — HYDROCODONE-ACETAMINOPHEN 5-325 MG PO TABS: 1.0000 | ORAL_TABLET | Freq: Two times a day (BID) | ORAL | 0 refills | Status: DC | PRN

## 2021-10-20 NOTE — Progress Notes (Signed)
? ?Subjective:  ? ? Patient ID: [name], male    DOB: 06/13/1956, 66 y.o.   MRN: [id number] ? ?HPI ? ?Patient here for follow up on blood pressure. ?Essential hypertension ? ?Mixed hyperlipidemia ? ?Chronic pain syndrome ? ?Need for vaccination - Plan: Pneumococcal conjugate vaccine 20-valent (Prevnar 20) ?This patient was seen today for chronic pain ? ?The medication list was reviewed and updated. ? ?Location of Pain for which the patient has been treated with regarding narcotics: Chronic low back pain ? ?Onset of this pain: Present for years ? ? -Compliance with medication: Good compliance ? ?- Number patient states they take daily: 1-1/2 daily ? ?-when was the last dose patient took?  Yesterday ? ?The patient was advised the importance of maintaining medication and not using illegal substances with these. ? ?Here for refills and follow up ? ?The patient was educated that we can provide 3 monthly scripts for their medication, it is their responsibility to follow the instructions. ? ?Side effects or complications from medications: Denies side effects ? ?Patient is aware that pain medications are meant to minimize the severity of the pain to allow their pain levels to improve to allow for better function. They are aware of that pain medications cannot totally remove their pain. ? ?Due for UDT ( at least once per year) : On next visit ? ?Scale of 1 to 10 ( 1 is least 10 is most) ?Your pain level without the medicine: 8 ?Your pain level with medication 3 ? ?Scale 1 to 10 ( 1-helps very little, 10 helps very well) ?How well does your pain medication reduce your pain so you can function better through out the day?  7 ? ?Quality of the pain: Burning aching throbbing ? ?Persistence of the pain: Present all the time ? ?Modifying factors: Worse with activity ?Patient has been counseled to quit smoking ?Patient denies any chest tightness ?Does get out of breath with activity ?Does have underlying heart disease ?Stable  currently ? ?Patient also has mental health issues followed by psychiatry ?Previous lab work reviewed ?  ? ?Review of Systems ? ?   ?Objective:  ? Physical Exam ?General-in no acute distress ?Eyes-no discharge ?Lungs-respiratory rate normal, CTA ?CV-no murmurs,RRR ?Extremities skin warm dry no edema ?Neuro grossly normal ?Behavior normal, alert ? ? ? ? ?   ?Assessment & Plan:  ?1. Essential hypertension ?Blood pressure good control continue current measures ? ?2. Mixed hyperlipidemia ?Continue cholesterol medicine recent labs good no need to repeat ? ?3. Chronic pain syndrome ?3 prescription sent in for pain medicine ?Urine drug on next visit ?The patient was seen in followup for chronic pain. ?A review over at their current pain status was discussed. Drug registry was checked. ?Prescriptions were given.  Regular follow-up recommended. ?Discussion was held regarding the importance of compliance with medication as well as pain medication contract. ? ?Patient was informed that medication may cause drowsiness and should not be combined  with other medications/alcohol or street drugs. If the patient feels medication is causing altered alertness then do not drive or operate dangerous equipment. ? ?Should be noted that the patient appears to be meeting appropriate use of opioids and response.  Evidenced by improved function and decent pain control without significant side effects and no evidence of overt aberrancy issues.  Upon discussion with the patient today they understand that opioid therapy is optional and they feel that the pain has been refractory to reasonable conservative measures and is significant and  affecting quality of life enough to warrant ongoing therapy and wishes to continue opioids.  Refills were provided. ? ? ?4. Need for vaccination ?Today ?- Pneumococcal conjugate vaccine 20-valent (Prevnar 20) ? ? ?

## 2021-10-29 NOTE — Patient Instructions (Signed)
[name]  10/29/2021     '@PREFPERIOPPHARMACY'$ @   Your procedure is scheduled on  11/05/2021.   Report to [name] at  [place]  A.M.   Call this number if you have problems the morning of surgery:  [phone]   Remember:  Follow the diet and prep instructions given to you by the office.    Use your inhaler before you come and bring your rescue inhaler with you.    Take these medicines the morning of surgery with A SIP OF WATER            xanax(if needed), amlodipine, prozac, gabapentin, hydrocodone or mobic(If needed), imdur, protonix, flomax.     Do not wear jewelry, make-up or nail polish.  Do not wear lotions, powders, or perfumes, or deodorant.  Do not shave 48 hours prior to surgery.  Men may shave face and neck.  Do not bring valuables to the hospital.  [facility] is not responsible for any belongings or valuables.  Contacts, dentures or bridgework may not be worn into surgery.  Leave your suitcase in the car.  After surgery it may be brought to your room.  For patients admitted to the hospital, discharge time will be determined by your treatment team.  Patients discharged the day of surgery will not be allowed to drive home and must have someone with them for 24 hours.    Special instructions:   DO NOT smoke tobacco or vape for 24 hours before your procedure.  Please read over the following fact sheets that you were given. Anesthesia Post-op Instructions and Care and Recovery After Surgery      Upper Endoscopy, Adult, Care After This sheet gives you information about how to care for yourself after your procedure. Your health care provider may also give you more specific instructions. If you have problems or questions, contact your health care provider. What can I expect after the procedure? After the procedure, it is common to have: A sore throat. Mild stomach pain or discomfort. Bloating. Nausea. Follow these instructions at  home:  Follow instructions from your health care provider about what to eat or drink after your procedure. Return to your normal activities as told by your health care provider. Ask your health care provider what activities are safe for you. Take over-the-counter and prescription medicines only as told by your health care provider. If you were given a sedative during the procedure, it can affect you for several hours. Do not drive or operate machinery until your health care provider says that it is safe. Keep all follow-up visits as told by your health care provider. This is important. Contact a health care provider if you have: A sore throat that lasts longer than one day. Trouble swallowing. Get help right away if: You vomit blood or your vomit looks like coffee grounds. You have: A fever. Bloody, black, or tarry stools. A severe sore throat or you cannot swallow. Difficulty breathing. Severe pain in your chest or abdomen. Summary After the procedure, it is common to have a sore throat, mild stomach discomfort, bloating, and nausea. If you were given a sedative during the procedure, it can affect you for several hours. Do not drive or operate machinery until your health care provider says that it is safe. Follow instructions from your health care provider about what to eat or drink after your procedure. Return to your normal activities as told by your health care  provider. This information is not intended to replace advice given to you by your health care provider. Make sure you discuss any questions you have with your health care provider. Document Revised: 04/06/2019 Document Reviewed: 10/31/2017 Elsevier Patient Education  [place]. Colonoscopy, Adult, Care After The following information offers guidance on how to care for yourself after your procedure. Your health care provider may also give you more specific instructions. If you have problems or questions, contact your  health care provider. What can I expect after the procedure? After the procedure, it is common to have: A small amount of blood in your stool for 24 hours after the procedure. Some gas. Mild cramping or bloating of your abdomen. Follow these instructions at home: Eating and drinking  Drink enough fluid to keep your urine pale yellow. Follow instructions from your health care provider about eating or drinking restrictions. Resume your normal diet as told by your health care provider. Avoid heavy or fried foods that are hard to digest. Activity Rest as told by your health care provider. Avoid sitting for a long time without moving. Get up to take short walks every 1-2 hours. This is important to improve blood flow and breathing. Ask for help if you feel weak or unsteady. Return to your normal activities as told by your health care provider. Ask your health care provider what activities are safe for you. Managing cramping and bloating  Try walking around when you have cramps or feel bloated. If directed, apply heat to your abdomen as told by your health care provider. Use the heat source that your health care provider recommends, such as a moist heat pack or a heating pad. Place a towel between your skin and the heat source. Leave the heat on for 20-30 minutes. Remove the heat if your skin turns bright red. This is especially important if you are unable to feel pain, heat, or cold. You have a greater risk of getting burned. General instructions If you were given a sedative during the procedure, it can affect you for several hours. Do not drive or operate machinery until your health care provider says that it is safe. For the first 24 hours after the procedure: Do not sign important documents. Do not drink alcohol. Do your regular daily activities at a slower pace than normal. Eat soft foods that are easy to digest. Take over-the-counter and prescription medicines only as told by your  health care provider. Keep all follow-up visits. This is important. Contact a health care provider if: You have blood in your stool 2-3 days after the procedure. Get help right away if: You have more than a small spotting of blood in your stool. You have large blood clots in your stool. You have swelling of your abdomen. You have nausea or vomiting. You have a fever. You have increasing pain in your abdomen that is not relieved with medicine. These symptoms may be an emergency. Get help right away. Call 911. Do not wait to see if the symptoms will go away. Do not drive yourself to the hospital. Summary After the procedure, it is common to have a small amount of blood in your stool. You may also have mild cramping and bloating of your abdomen. If you were given a sedative during the procedure, it can affect you for several hours. Do not drive or operate machinery until your health care provider says that it is safe. Get help right away if you have a lot of blood in  your stool, nausea or vomiting, a fever, or increased pain in your abdomen. This information is not intended to replace advice given to you by your health care provider. Make sure you discuss any questions you have with your health care provider. Document Revised: 01/21/2021 Document Reviewed: 01/21/2021 Elsevier Patient Education  [place] After This sheet gives you information about how to care for yourself after your procedure. Your health care provider may also give you more specific instructions. If you have problems or questions, contact your health care provider. What can I expect after the procedure? After the procedure, it is common to have: Tiredness. Forgetfulness about what happened after the procedure. Impaired judgment for important decisions. Nausea or vomiting. Some difficulty with balance. Follow these instructions at home: For the time period you were told by your  health care provider:     Rest as needed. Do not participate in activities where you could fall or become injured. Do not drive or use machinery. Do not drink alcohol. Do not take sleeping pills or medicines that cause drowsiness. Do not make important decisions or sign legal documents. Do not take care of children on your own. Eating and drinking Follow the diet that is recommended by your health care provider. Drink enough fluid to keep your urine pale yellow. If you vomit: Drink water, juice, or soup when you can drink without vomiting. Make sure you have little or no nausea before eating solid foods. General instructions Have a responsible adult stay with you for the time you are told. It is important to have someone help care for you until you are awake and alert. Take over-the-counter and prescription medicines only as told by your health care provider. If you have sleep apnea, surgery and certain medicines can increase your risk for breathing problems. Follow instructions from your health care provider about wearing your sleep device: Anytime you are sleeping, including during daytime naps. While taking prescription pain medicines, sleeping medicines, or medicines that make you drowsy. Avoid smoking. Keep all follow-up visits as told by your health care provider. This is important. Contact a health care provider if: You keep feeling nauseous or you keep vomiting. You feel light-headed. You are still sleepy or having trouble with balance after 24 hours. You develop a rash. You have a fever. You have redness or swelling around the IV site. Get help right away if: You have trouble breathing. You have new-onset confusion at home. Summary For several hours after your procedure, you may feel tired. You may also be forgetful and have poor judgment. Have a responsible adult stay with you for the time you are told. It is important to have someone help care for you until you are  awake and alert. Rest as told. Do not drive or operate machinery. Do not drink alcohol or take sleeping pills. Get help right away if you have trouble breathing, or if you suddenly become confused. This information is not intended to replace advice given to you by your health care provider. Make sure you discuss any questions you have with your health care provider. Document Revised: 05/05/2021 Document Reviewed: 05/03/2019 Elsevier Patient Education  [place].

## 2021-10-30 ENCOUNTER — Encounter: Payer: Self-pay | Admitting: Pulmonary Disease

## 2021-11-02 NOTE — Telephone Encounter (Signed)
Transferred wife to schedulers, she states that they have not received the prep or the instructions for his upcoming procedure.

## 2021-11-02 NOTE — Telephone Encounter (Signed)
Pt's wife phoned advising she had questions for the nurse regarding her husband. Please call [phone]. Pt last seen [name]

## 2021-11-02 NOTE — Telephone Encounter (Signed)
Spoke to pt's wife, she called [facility] and they will fill rx for prep. She is aware of pre-op appt tomorrow at 10:00am. She is unable to find TCS instructions. Advised her he will start clear liquids after lunch tomorrow. Advised her to ask pre-op nurse to print instructions. If pre-op nurse is unable to print instructions come by office to pick up.

## 2021-11-03 ENCOUNTER — Encounter (HOSPITAL_COMMUNITY): Payer: Self-pay

## 2021-11-03 ENCOUNTER — Encounter (HOSPITAL_COMMUNITY)
Admission: RE | Admit: 2021-11-03 | Discharge: 2021-11-03 | Disposition: A | Discharge status: Home / Self Care | Payer: Medicare HMO | Source: Ambulatory Visit | Attending: Internal Medicine | Admitting: Internal Medicine

## 2021-11-05 ENCOUNTER — Ambulatory Visit (HOSPITAL_BASED_OUTPATIENT_CLINIC_OR_DEPARTMENT_OTHER): Payer: Medicare HMO | Admitting: Anesthesiology

## 2021-11-05 ENCOUNTER — Encounter (HOSPITAL_COMMUNITY): Payer: Self-pay | Admitting: Internal Medicine

## 2021-11-05 ENCOUNTER — Ambulatory Visit (HOSPITAL_COMMUNITY)
Admission: RE | Admit: 2021-11-05 | Discharge: 2021-11-05 | Disposition: A | Discharge status: Home / Self Care | Payer: Medicare HMO | Attending: Internal Medicine | Admitting: Internal Medicine

## 2021-11-05 ENCOUNTER — Encounter (HOSPITAL_COMMUNITY)
Admission: RE | Disposition: A | Discharge status: Home / Self Care | Payer: Self-pay | Source: Home / Self Care | Attending: Internal Medicine

## 2021-11-05 ENCOUNTER — Ambulatory Visit (HOSPITAL_COMMUNITY): Payer: Medicare HMO | Admitting: Anesthesiology

## 2021-11-05 DIAGNOSIS — Z86010 Personal history of colonic polyps: Secondary | ICD-10-CM

## 2021-11-05 DIAGNOSIS — R197 Diarrhea, unspecified: Secondary | ICD-10-CM

## 2021-11-05 DIAGNOSIS — K529 Noninfective gastroenteritis and colitis, unspecified: Secondary | ICD-10-CM | POA: Diagnosis not present

## 2021-11-05 DIAGNOSIS — Z955 Presence of coronary angioplasty implant and graft: Secondary | ICD-10-CM | POA: Diagnosis not present

## 2021-11-05 DIAGNOSIS — G894 Chronic pain syndrome: Secondary | ICD-10-CM

## 2021-11-05 DIAGNOSIS — D72829 Elevated white blood cell count, unspecified: Secondary | ICD-10-CM

## 2021-11-05 DIAGNOSIS — K635 Polyp of colon: Secondary | ICD-10-CM | POA: Diagnosis not present

## 2021-11-05 DIAGNOSIS — F17210 Nicotine dependence, cigarettes, uncomplicated: Secondary | ICD-10-CM

## 2021-11-05 DIAGNOSIS — F32A Depression, unspecified: Secondary | ICD-10-CM | POA: Insufficient documentation

## 2021-11-05 DIAGNOSIS — R1310 Dysphagia, unspecified: Secondary | ICD-10-CM | POA: Diagnosis not present

## 2021-11-05 DIAGNOSIS — G9340 Encephalopathy, unspecified: Secondary | ICD-10-CM

## 2021-11-05 DIAGNOSIS — K219 Gastro-esophageal reflux disease without esophagitis: Secondary | ICD-10-CM | POA: Insufficient documentation

## 2021-11-05 DIAGNOSIS — M79609 Pain in unspecified limb: Secondary | ICD-10-CM

## 2021-11-05 DIAGNOSIS — I25119 Atherosclerotic heart disease of native coronary artery with unspecified angina pectoris: Secondary | ICD-10-CM

## 2021-11-05 DIAGNOSIS — I252 Old myocardial infarction: Secondary | ICD-10-CM | POA: Insufficient documentation

## 2021-11-05 DIAGNOSIS — K621 Rectal polyp: Secondary | ICD-10-CM

## 2021-11-05 DIAGNOSIS — K640 First degree hemorrhoids: Secondary | ICD-10-CM

## 2021-11-05 DIAGNOSIS — K449 Diaphragmatic hernia without obstruction or gangrene: Secondary | ICD-10-CM

## 2021-11-05 DIAGNOSIS — K5730 Diverticulosis of large intestine without perforation or abscess without bleeding: Secondary | ICD-10-CM

## 2021-11-05 DIAGNOSIS — F419 Anxiety disorder, unspecified: Secondary | ICD-10-CM | POA: Diagnosis not present

## 2021-11-05 DIAGNOSIS — J449 Chronic obstructive pulmonary disease, unspecified: Secondary | ICD-10-CM | POA: Insufficient documentation

## 2021-11-05 DIAGNOSIS — K625 Hemorrhage of anus and rectum: Secondary | ICD-10-CM

## 2021-11-05 DIAGNOSIS — E785 Hyperlipidemia, unspecified: Secondary | ICD-10-CM

## 2021-11-05 DIAGNOSIS — F324 Major depressive disorder, single episode, in partial remission: Secondary | ICD-10-CM

## 2021-11-05 DIAGNOSIS — Z800 Family history of malignant neoplasm of digestive organs: Secondary | ICD-10-CM | POA: Diagnosis not present

## 2021-11-05 DIAGNOSIS — K641 Second degree hemorrhoids: Secondary | ICD-10-CM

## 2021-11-05 DIAGNOSIS — I10 Essential (primary) hypertension: Secondary | ICD-10-CM | POA: Insufficient documentation

## 2021-11-05 DIAGNOSIS — G4730 Sleep apnea, unspecified: Secondary | ICD-10-CM | POA: Insufficient documentation

## 2021-11-05 DIAGNOSIS — Z951 Presence of aortocoronary bypass graft: Secondary | ICD-10-CM

## 2021-11-05 DIAGNOSIS — K921 Melena: Secondary | ICD-10-CM | POA: Diagnosis not present

## 2021-11-05 DIAGNOSIS — Z85038 Personal history of other malignant neoplasm of large intestine: Secondary | ICD-10-CM

## 2021-11-05 DIAGNOSIS — I249 Acute ischemic heart disease, unspecified: Secondary | ICD-10-CM

## 2021-11-05 DIAGNOSIS — R911 Solitary pulmonary nodule: Secondary | ICD-10-CM

## 2021-11-05 DIAGNOSIS — K317 Polyp of stomach and duodenum: Secondary | ICD-10-CM | POA: Diagnosis not present

## 2021-11-05 DIAGNOSIS — M75122 Complete rotator cuff tear or rupture of left shoulder, not specified as traumatic: Secondary | ICD-10-CM

## 2021-11-05 DIAGNOSIS — I213 ST elevation (STEMI) myocardial infarction of unspecified site: Secondary | ICD-10-CM

## 2021-11-05 DIAGNOSIS — R195 Other fecal abnormalities: Secondary | ICD-10-CM

## 2021-11-05 DIAGNOSIS — I2510 Atherosclerotic heart disease of native coronary artery without angina pectoris: Secondary | ICD-10-CM | POA: Diagnosis not present

## 2021-11-05 DIAGNOSIS — Z79899 Other long term (current) drug therapy: Secondary | ICD-10-CM | POA: Diagnosis not present

## 2021-11-05 DIAGNOSIS — G4733 Obstructive sleep apnea (adult) (pediatric): Secondary | ICD-10-CM

## 2021-11-05 DIAGNOSIS — K3184 Gastroparesis: Secondary | ICD-10-CM

## 2021-11-05 DIAGNOSIS — J45909 Unspecified asthma, uncomplicated: Secondary | ICD-10-CM | POA: Diagnosis not present

## 2021-11-05 DIAGNOSIS — R739 Hyperglycemia, unspecified: Secondary | ICD-10-CM

## 2021-11-05 DIAGNOSIS — G2581 Restless legs syndrome: Secondary | ICD-10-CM

## 2021-11-05 DIAGNOSIS — F418 Other specified anxiety disorders: Secondary | ICD-10-CM

## 2021-11-05 DIAGNOSIS — R1013 Epigastric pain: Secondary | ICD-10-CM

## 2021-11-05 HISTORY — PX: ESOPHAGOGASTRODUODENOSCOPY (EGD) WITH PROPOFOL: SHX5813

## 2021-11-05 HISTORY — PX: COLONOSCOPY WITH PROPOFOL: SHX5780

## 2021-11-05 HISTORY — PX: MALONEY DILATION: SHX5535

## 2021-11-05 HISTORY — PX: BIOPSY: SHX5522

## 2021-11-05 SURGERY — COLONOSCOPY WITH PROPOFOL
Anesthesia: General

## 2021-11-05 MED ORDER — PROPOFOL 500 MG/50ML IV EMUL
INTRAVENOUS | Status: DC | PRN
  Administered 2021-11-05: 200 ug/kg/min via INTRAVENOUS

## 2021-11-05 MED ORDER — PROPOFOL 500 MG/50ML IV EMUL
INTRAVENOUS | Status: AC
  Filled 2021-11-05: qty 50

## 2021-11-05 MED ORDER — DEXMEDETOMIDINE (PRECEDEX) IN NS 20 MCG/5ML (4 MCG/ML) IV SYRINGE
PREFILLED_SYRINGE | INTRAVENOUS | Status: DC | PRN
  Administered 2021-11-05: 8 ug via INTRAVENOUS

## 2021-11-05 MED ORDER — LACTATED RINGERS IV SOLN: INTRAVENOUS | Status: DC

## 2021-11-05 MED ORDER — STERILE WATER FOR IRRIGATION IR SOLN
Status: DC | PRN
  Administered 2021-11-05: .6 mL

## 2021-11-05 MED ORDER — PROPOFOL 10 MG/ML IV BOLUS
INTRAVENOUS | Status: DC | PRN
  Administered 2021-11-05: 100 mg via INTRAVENOUS

## 2021-11-05 NOTE — H&P (Signed)
$'@LOGO'f$ @   Primary Care Physician:  [name], MD Primary Gastroenterologist:  Dr. [name]  Pre-Procedure History & Physical: HPI:  [name] is a 66 y.o. male here for Further evaluation of GERD/dysphagia.  Intermittent rectal bleeding distant history of advanced adenoma.  Chronic diarrhea.   Here for an EGD and colonoscopy.  Past Medical History:  Diagnosis Date   Anxiety    Anxiety and depression    ASCVD (arteriosclerotic cardiovascular disease)    a. s/p PTCA to LCx in 1993 b. BMS to RCA in 01/2012 with residual 80% OM2 stenosis and medical management recommended c. 08/2019: cath showing patent stents with occluded OM2 --> medical management. d. s/p CABG in 02/2021 with LIMA-LAD, reverse SVG-D1 and SVG-OM   Asthma    CAD (coronary artery disease)    Carcinoma in situ of colon 2004   rectal polyp   Colitis, ischemic ([place]) 2011   COPD (chronic obstructive pulmonary disease) (HCC)    mild ;excercise induced hypoxemia by cp stress test ;asthma ,bronchitis,   Depression    Diverticulosis    Gastritis 12/30/2010   EGD Dr [name]   GERD (gastroesophageal reflux disease)    GI bleed    H. 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)    Leukocytosis    [name]   Lung nodule 07/16/2011   Myocardial infarction [facility])    age 72   Restless leg syndrome    Schatzki's ring    Sleep apnea    does not use CPAP:cannot tolerate, PCP aware   Syncope    Tobacco abuse    50 pack years continuing at one halp pack daily   Tubular adenoma of colon 06/2009   Colonosocpy Dr [name]    Past Surgical History:  Procedure Laterality Date   BIOPSY  12/19/2017   Procedure: BIOPSY;  Surgeon: [name], MD;  [facility];  Service: Endoscopy;;  gastric   CARDIAC CATHETERIZATION     CHOLECYSTECTOMY  2004   COLONOSCOPY  06/2009   normal terminal ileum,  segmental mild inflammation of sigmoid colon (bx unremarkable), polyp, tubular adenoma   COLONOSCOPY N/A 07/31/2013   Dr.Delbra Zellars- redundant anal canal hemorrhoids, colonic diverticulosis, tubular adenoma   COLONOSCOPY W/ POLYPECTOMY  2004   rectal polyp with carcinoma in situ removed via colonoscopy   COLONOSCOPY WITH PROPOFOL N/A 09/15/2015   Dr. [name]: Scattered diverticula throughout the colon, 2 sessile polyps found in the descending colon and cecum, 5 mm in size.  Cecal polyp was sessile serrated polyp, descending colon polyp was a tubular adenoma.  He had a abnormal perianal exam along with grade 3 hemorrhoids.  Surveillance exam recommended for 5-year follow-up.   COLONOSCOPY WITH PROPOFOL N/A 12/19/2017   one 5 mm polyp in ascending colon and 1 cm sessile polyp in ascending s/p removal. Tubular adenoma. internal hemorrhoids   CORONARY ANGIOPLASTY WITH STENT PLACEMENT  01/26/2012   "1; total is now 2"   CORONARY ARTERY BYPASS GRAFT N/A 03/13/2021   Procedure: CORONARY ARTERY BYPASS GRAFTING (CABG), ON PUMP, TIMES THREE, USING LEFT INTERNAL MAMMARY ARTERY AND RIGHT ENDOSCOPICALLY HARVESTED GREATER SAPHENOUS VEIN;  Surgeon: Lajuana Matte, MD;  Location: [place];  Service: Open Heart Surgery;  Laterality: N/A;   CORONARY/GRAFT ACUTE MI REVASCULARIZATION N/A 03/10/2021   Procedure: Coronary/Graft Acute MI Revascularization;  Surgeon: Belva Crome, MD;  Location: [place] CV LAB;  Service: Cardiovascular;  Laterality: N/A;   ENDOVEIN HARVEST OF GREATER SAPHENOUS VEIN Right 03/13/2021   Procedure: ENDOVEIN HARVEST OF GREATER SAPHENOUS VEIN;  Surgeon: Lajuana Matte, MD;  Location: [place];  Service: Open Heart Surgery;  Laterality: Right;   ESOPHAGOGASTRODUODENOSCOPY  02/2009   query Barrett's but bx negative   ESOPHAGOGASTRODUODENOSCOPY  12/30/2010   [name] Kaizlee Carlino,gastritis, dilated 68F, sm HH, 1 small ulcer, Duodenal erosions, benign bx   ESOPHAGOGASTRODUODENOSCOPY (EGD) WITH ESOPHAGEAL  DILATION N/A 09/12/2012   [id number] Schatzki's ring s/p Maloney dilator. Small hiatal hernia. negative path   ESOPHAGOGASTRODUODENOSCOPY (EGD) WITH ESOPHAGEAL DILATION N/A 07/31/2013   Dr. [name]- normal egd, s/p [facility] dilation empirically. Normal small bowel biopsies    ESOPHAGOGASTRODUODENOSCOPY (EGD) WITH PROPOFOL N/A 09/15/2015   Dr. [name]: Medium sized hiatal hernia, normal-appearing esophagus status post empiric dilation   ESOPHAGOGASTRODUODENOSCOPY (EGD) WITH PROPOFOL N/A 12/19/2017   erosive esophagitis s/p dilation, erosive gastropathy, normal duodenum   ESOPHAGOGASTRODUODENOSCOPY (EGD) WITH PROPOFOL N/A 09/17/2019   Non-obstructing Schatzki's ring s/p dilation, medium-sized hiatal hernia, otherwise normal.   FLEXIBLE SIGMOIDOSCOPY  12/30/2010    [name] Nestor Wieneke,; internal hemorrhoids, anal papilla   HAND SURGERY     surgical intervention for injury of the fingers of the left hand many years ago   heart stent     HEMORRHOID BANDING     Dr. [name]   LEFT HEART CATH AND CORONARY ANGIOGRAPHY N/A 08/29/2019   Procedure: LEFT HEART CATH AND CORONARY ANGIOGRAPHY;  Surgeon: [name], MD;  Location: [place] CV LAB;  Service: Cardiovascular;  Laterality: N/A;   LEFT HEART CATH AND CORONARY ANGIOGRAPHY N/A 03/10/2021   Procedure: LEFT HEART CATH AND CORONARY ANGIOGRAPHY;  Surgeon: Belva Crome, MD;  Location: [place] CV [facility];  Service: Cardiovascular;  Laterality: N/A;   LEFT HEART CATHETERIZATION WITH CORONARY ANGIOGRAM N/A 01/26/2012   Procedure: LEFT HEART CATHETERIZATION WITH CORONARY ANGIOGRAM;  Surgeon: [name], MD;  Location: [facility] CATH LAB;  Service: Cardiovascular;  Laterality: N/A;   MALONEY DILATION N/A 09/15/2015   Procedure: [name] DILATION;  Surgeon: [name], MD;  [facility];  Service: Endoscopy;  Laterality: N/A;   MALONEY DILATION N/A 12/19/2017   Procedure: [name] DILATION;  Surgeon: [name], MD;  [facility];  Service:  Endoscopy;  Laterality: N/A;   MALONEY DILATION N/A 09/17/2019   Procedure: [name] DILATION;  Surgeon: [name], MD;  [facility];  Service: Endoscopy;  Laterality: N/A;   NASAL SEPTOPLASTY W/ TURBINOPLASTY  10/06/2011   Procedure: NASAL SEPTOPLASTY WITH TURBINATE REDUCTION;  Surgeon: Izora Gala, MD;  Location: [place];  Service: ENT;  Laterality: Bilateral;   PERCUTANEOUS CORONARY STENT INTERVENTION (PCI-S) N/A 01/26/2012   Procedure: PERCUTANEOUS CORONARY STENT INTERVENTION (PCI-S);  Surgeon: [name], MD;  Location: [facility] C[facility] LAB;  Service: Cardiovascular;  Laterality: N/A;   POLYPECTOMY  09/15/2015   Procedure: POLYPECTOMY;  Surgeon: [name], MD;  [facility];  Service: Endoscopy;;  Cecal polyp removed via cold snare/ Descending colon polyp removed via cold snare   POLYPECTOMY  12/19/2017   Procedure: POLYPECTOMY;  Surgeon: [name], MD;  [facility];  Service: Endoscopy;;  colon   TEE WITHOUT CARDIOVERSION N/A 03/13/2021   Procedure: TRANSESOPHAGEAL ECHOCARDIOGRAM (TEE);  Surgeon: Lajuana Matte, MD;  Location: [place];  Service: Open Heart Surgery;  Laterality: N/A;  Prior to Admission medications   Medication Sig Start Date End Date Taking? Authorizing Provider  albuterol (VENTOLIN HFA) 108 (90 Base) MCG/ACT inhaler Inhale 2 puffs into the lungs every 4 (four) hours as needed for wheezing or shortness of breath. 10/30/19  Yes Tanda Rockers, MD  ALPRAZolam Duanne Moron) 0.5 MG tablet Take one twice a day and two at bedtime 09/08/21  Yes Cloria Spring, MD  amLODipine (NORVASC) 5 MG tablet 1 take tablet ('5mg'$ ) by mouth Once a day. 09/08/21  Yes [name], MD  aspirin EC 81 MG tablet Take 81 mg by mouth daily.   Yes [provider]  atorvastatin (LIPITOR) 80 MG tablet TAKE 1 TABLET BY MOUTH EVERY DAY 07/20/21  Yes Branch, Alphonse Guild, MD  clopidogrel (PLAVIX) 75 MG tablet Take 1 tablet (75 mg total) by mouth daily. 03/19/21  Yes  Roddenberry, Arlis Porta, PA-C  Cyanocobalamin (VITAMIN B-12 PO) Take 1 tablet by mouth in the morning.   Yes [provider]  FLUoxetine (PROZAC) 20 MG capsule Take 1 capsule (20 mg total) by mouth daily. Take with '40mg'$  for a total daily dose of '60mg'$  09/08/21 09/08/22 Yes Cloria Spring, MD  FLUoxetine (PROZAC) 40 MG capsule Take 1 capsule (40 mg total) by mouth daily. Take with '20mg'$  for a total daily dose of '60mg'$  09/08/21  Yes Cloria Spring, MD  Fluticasone-Umeclidin-Vilant (TRELEGY ELLIPTA) 100-62.5-25 MCG/ACT AEPB Inhale 1 puff into the lungs daily. 09/21/21  Yes Martyn Ehrich, NP  gabapentin (NEURONTIN) 300 MG capsule Take 2 capsules (600 mg total) by mouth 3 (three) times daily. 07/01/21  Yes Cloria Spring, MD  HYDROcodone-acetaminophen (NORCO/VICODIN) 5-325 MG tablet 1 twice daily as needed 09/28/21  Yes Luking, Elayne Snare, MD  isosorbide mononitrate (IMDUR) 30 MG 24 hr tablet Take 30 mg by mouth daily. 04/16/21  Yes [provider]  lipase/protease/amylase (CREON) 36000 UNITS CPEP capsule Take 2 capsules (72,000 Units total) by mouth 3 (three) times daily with meals. May also take 1 capsule (36,000 Units total) as needed (with snacks). Patient taking differently: Take 2 capsules (72,000 Units total) by mouth 3 (three) times daily with meals. 08/24/21  Yes Mikella Linsley, [name], MD  meloxicam (MOBIC) 15 MG tablet Take 1 tablet (15 mg total) by mouth daily. 09/28/21  Yes [name], MD  metoprolol succinate (TOPROL-XL) 50 MG 24 hr tablet TAKE 1 TABLET BY MOUTH DAILY 09/18/21  Yes Branch, Alphonse Guild, MD  nitroGLYCERIN (NITROSTAT) 0.4 MG SL tablet Place 1 tablet (0.4 mg total) under the tongue every 5 (five) minutes as needed for chest pain. 10/06/20  Yes Luking, Elayne Snare, MD  OLANZapine (ZYPREXA) 10 MG tablet Take 1 tablet (10 mg total) by mouth at bedtime. 07/01/21 07/01/22 Yes Cloria Spring, MD  pantoprazole (PROTONIX) 40 MG tablet TAKE 1 TABLET BY MOUTH TWICE A DAY BEFORE A MEAL 07/20/21   Yes Mahala Menghini, PA-C  polyethylene glycol-electrolytes (NULYTELY) 420 g solution As directed 09/17/21  Yes Nancy Arvin, [name], MD  rOPINIRole (REQUIP) 2 MG tablet TAKE 1 TABLET BY MOUTH EVERYDAY AT BEDTIME 09/01/21  Yes Luking, Elayne Snare, MD  tamsulosin (FLOMAX) 0.4 MG CAPS capsule Take 1 capsule (0.4 mg total) by mouth daily. 09/08/21  Yes [name], MD  traZODone (DESYREL) 50 MG tablet TAKE 1 TABLET BY MOUTH EVERYDAY AT BEDTIME 09/08/21  Yes Luking, Scott A, MD  valsartan (DIOVAN) 40 MG tablet Take 40 mg by mouth daily. 10/24/21  Yes [provider]  HYDROcodone-acetaminophen (NORCO/VICODIN) 5-325 MG tablet Take 1 tablet by mouth 2 (two) times daily as needed for moderate pain. Patient not taking: Reported on 11/04/2021 10/20/21   [name], MD  HYDROcodone-acetaminophen (NORCO/VICODIN) 5-325 MG tablet Take 1 tablet by mouth 2 (two) times daily as needed for moderate pain. Patient not taking: Reported on 11/04/2021 10/20/21   [name], MD  HYDROcodone-acetaminophen (NORCO/VICODIN) 5-325 MG tablet Take 1 tablet by mouth 2 (two) times daily as needed for moderate pain. Patient not taking: Reported on 11/04/2021 10/20/21   [name], MD  valsartan (DIOVAN) 80 MG tablet Take 1 tablet (80 mg total) by mouth daily. Patient not taking: Reported on 11/04/2021 09/01/21   [name], MD    Allergies as of 09/17/2021   (No Known Allergies)    Family History  Problem Relation Age of Onset   Lung disease Father        deceased, black lung   Heart disease Mother        blood clots   Depression Mother    Cancer Paternal Uncle        unknown type   Cancer Maternal Aunt        unknown type   Kidney failure Maternal Uncle    Hypertension Brother    Colon cancer Neg Hx    ADD / ADHD Neg Hx    Alcohol abuse Neg Hx    Drug abuse Neg Hx    Anxiety disorder Neg Hx    Bipolar disorder Neg Hx    Dementia Neg Hx    OCD Neg Hx    Paranoid behavior Neg Hx    Schizophrenia Neg Hx     Physical abuse Neg Hx    Sexual abuse Neg Hx    Seizures Neg Hx     Social History   Socioeconomic History   Marital status: Married    Spouse name: Not on file   Number of children: 3   Years of education: Not on file   Highest education level: Not on file  Occupational History   Occupation: disable    Employer: RETIRED    Comment: [employer]  Tobacco Use   Smoking status: Former    Packs/day: 1.00    Years: 48.00    Pack years: 48.00    Types: Cigarettes    Start date: 07/31/1969    Quit date: 03/16/2021    Years since quitting: 0.6   Smokeless tobacco: Former  Scientific laboratory technician Use: Never used  Substance and Sexual Activity   Alcohol use: Yes    Comment: Drinks a beer occasionally   Drug use: No   Sexual activity: Not Currently  Other Topics Concern   Not on file  Social History Narrative   3 stepchildren   Social Determinants of Health   Financial Resource Strain: Not on file  Food Insecurity: Not on file  Transportation Needs: Not on file  Physical Activity: Not on file  Stress: Not on file  Social Connections: Not on file  Intimate Partner Violence: Not on file    Review of Systems: See HPI, otherwise negative ROS  Physical Exam: BP (!) 166/92   Pulse 67   Temp 97.9 F (36.6 C) (Oral)   Resp 16   Ht '5\' 10"'$  (1.778 m)   Wt 73.9 kg   SpO2 100%   BMI 23.39 kg/m  General:   Alert,  Well-developed, well-nourished, pleasant and cooperative in  NAD Neck:  Supple; no masses or thyromegaly. No significant cervical adenopathy. Lungs:  Clear throughout to auscultation.   No wheezes, crackles, or rhonchi. No acute distress. Heart:  Regular rate and rhythm; no murmurs, clicks, rubs,  or gallops. Abdomen: Non-distended, normal bowel sounds.  Soft and nontender without appreciable mass or hepatosplenomegaly.  Pulses:  Normal pulses noted. Extremities:  Without clubbing or edema.  Impression/Plan:    66 year old gentleman here for further evaluation of  esophageal dysphagia in the setting of GERD.  Also here for a diagnostic colonoscopy.  History of rectal bleeding, chronic diarrhea and a distant history of advanced adenoma   I have offered the patientboth an EGD and a colonoscopy today.  He remains on Plavix. The risks, benefits, limitations, imponderables and alternatives regarding both EGD and colonoscopy have been reviewed with the patient. Questions have been answered. All parties agreeable.       Notice: This dictation was prepared with Dragon dictation along with smaller phrase technology. Any transcriptional errors that result from this process are unintentional and may not be corrected upon review.

## 2021-11-05 NOTE — Transfer of Care (Signed)
Immediate Anesthesia Transfer of Care Note  Patient: [name]  Procedure(s) Performed: COLONOSCOPY WITH PROPOFOL ESOPHAGOGASTRODUODENOSCOPY (EGD) WITH PROPOFOL [place]  Patient Location: PACU  Anesthesia Type:General  Level of Consciousness: awake, alert  and oriented  Airway & Oxygen Therapy: Patient Spontanous Breathing  Post-op Assessment: Report given to RN, Post -op Vital signs reviewed and stable, Patient moving all extremities X 4 and Patient able to stick tongue midline  Post vital signs: Reviewed  Last Vitals:  Vitals Value Taken Time  BP 87/62   Temp 98.6   Pulse 72   Resp 20   SpO2 98     Last Pain:  Vitals:   11/05/21 1017  TempSrc: Oral  PainSc: 0-No pain         Complications: No notable events documented.

## 2021-11-05 NOTE — Discharge Instructions (Signed)
Colonoscopy Discharge Instructions  Read the instructions outlined below and refer to this sheet in the next few weeks. These discharge instructions provide you with general information on caring for yourself after you leave the hospital. Your doctor may also give you specific instructions. While your treatment has been planned according to the most current medical practices available, unavoidable complications occasionally occur. If you have any problems or questions after discharge, call Dr. [name] at [phone]. ACTIVITY You may resume your regular activity, but move at a slower pace for the next 24 hours.  Take frequent rest periods for the next 24 hours.  Walking will help get rid of the air and reduce the bloated feeling in your belly (abdomen).  No driving for 24 hours (because of the medicine (anesthesia) used during the test).   Do not sign any important legal documents or operate any machinery for 24 hours (because of the anesthesia used during the test).  NUTRITION Drink plenty of fluids.  You may resume your normal diet as instructed by your doctor.  Begin with a light meal and progress to your normal diet. Heavy or fried foods are harder to digest and may make you feel sick to your stomach (nauseated).  Avoid alcoholic beverages for 24 hours or as instructed.  MEDICATIONS You may resume your normal medications unless your doctor tells you otherwise.  WHAT YOU CAN EXPECT TODAY Some feelings of bloating in the abdomen.  Passage of more gas than usual.  Spotting of blood in your stool or on the toilet paper.  IF YOU HAD POLYPS REMOVED DURING THE COLONOSCOPY: No aspirin products for 7 days or as instructed.  No alcohol for 7 days or as instructed.  Eat a soft diet for the next 24 hours.  FINDING OUT THE RESULTS OF YOUR TEST Not all test results are available during your visit. If your test results are not back during the visit, make an appointment with your caregiver to find out the  results. Do not assume everything is normal if you have not heard from your caregiver or the medical facility. It is important for you to follow up on all of your test results.  SEEK IMMEDIATE MEDICAL ATTENTION IF: You have more than a spotting of blood in your stool.  Your belly is swollen (abdominal distention).  You are nauseated or vomiting.  You have a temperature over 101.  You have abdominal pain or discomfort that is severe or gets worse throughout the day.   EGD Discharge instructions Please read the instructions outlined below and refer to this sheet in the next few weeks. These discharge instructions provide you with general information on caring for yourself after you leave the hospital. Your doctor may also give you specific instructions. While your treatment has been planned according to the most current medical practices available, unavoidable complications occasionally occur. If you have any problems or questions after discharge, please call your doctor. ACTIVITY You may resume your regular activity but move at a slower pace for the next 24 hours.  Take frequent rest periods for the next 24 hours.  Walking will help expel (get rid of) the air and reduce the bloated feeling in your abdomen.  No driving for 24 hours (because of the anesthesia (medicine) used during the test).  You may shower.  Do not sign any important legal documents or operate any machinery for 24 hours (because of the anesthesia used during the test).  NUTRITION Drink plenty of fluids.  You may  resume your normal diet.  Begin with a light meal and progress to your normal diet.  Avoid alcoholic beverages for 24 hours or as instructed by your caregiver.  MEDICATIONS You may resume your normal medications unless your caregiver tells you otherwise.  WHAT YOU CAN EXPECT TODAY You may experience abdominal discomfort such as a feeling of fullness or "gas" pains.  FOLLOW-UP Your doctor will discuss the results of  your test with you.  SEEK IMMEDIATE MEDICAL ATTENTION IF ANY OF THE FOLLOWING OCCUR: Excessive nausea (feeling sick to your stomach) and/or vomiting.  Severe abdominal pain and distention (swelling).  Trouble swallowing.  Temperature over 101 F (37.8 C).  Rectal bleeding or vomiting of blood.    Your esophagus was stretched today.  Your duodenum was biopsied  1 polyp removed from your colon.  You likely bleeding from hemorrhoids.  Samples taken.    Further recommendations to follow pending review of pathology report  Office visit with [place] in 3 months  At patient request I called Hollie Salk at [phone] - "  Number not in service"   hemorrhoid information provided.

## 2021-11-05 NOTE — Anesthesia Postprocedure Evaluation (Signed)
Anesthesia Post Note  Patient: [name]  Procedure(s) Performed: COLONOSCOPY WITH PROPOFOL ESOPHAGOGASTRODUODENOSCOPY (EGD) WITH PROPOFOL [name]  Patient location during evaluation: Phase II Anesthesia Type: General Level of consciousness: awake and alert and oriented Pain management: pain level controlled Vital Signs Assessment: post-procedure vital signs reviewed and stable Respiratory status: spontaneous breathing, nonlabored ventilation and respiratory function stable Cardiovascular status: blood pressure returned to baseline and stable Postop Assessment: no apparent nausea or vomiting Anesthetic complications: no   No notable events documented.   Last Vitals:  Vitals:   11/05/21 1017 11/05/21 1303  BP: (!) 166/92 100/74  Pulse: 67 69  Resp: 16 (!) 25  Temp: 36.6 C 36.7 C  SpO2: 100% 98%    Last Pain:  Vitals:   11/05/21 1303  TempSrc: Oral  PainSc: 0-No pain                 Shantel Helwig C Geriann Lafont

## 2021-11-05 NOTE — Anesthesia Preprocedure Evaluation (Signed)
Anesthesia Evaluation  Patient identified by MRN, date of birth, ID band Patient awake    Reviewed: Allergy & Precautions, NPO status , Patient's Chart, lab work & pertinent test results  Airway Mallampati: II  TM Distance: >3 FB Neck ROM: Full    Dental  (+) Dental Advisory Given, Missing   Pulmonary asthma , sleep apnea , COPD, former smoker,    Pulmonary exam normal breath sounds clear to auscultation       Cardiovascular hypertension, Pt. on medications + angina + CAD, + Past MI and + Cardiac Stents  Normal cardiovascular exam Rhythm:Regular Rate:Normal     Neuro/Psych PSYCHIATRIC DISORDERS Anxiety Depression  Neuromuscular disease    GI/Hepatic Neg liver ROS, hiatal hernia, GERD  Medicated,  Endo/Other  negative endocrine ROS  Renal/GU negative Renal ROS  negative genitourinary   Musculoskeletal negative musculoskeletal ROS (+)   Abdominal   Peds negative pediatric ROS (+)  Hematology negative hematology ROS (+)   Anesthesia Other Findings   Reproductive/Obstetrics negative OB ROS                             Anesthesia Physical Anesthesia Plan  ASA: 3  Anesthesia Plan: General   Post-op Pain Management: Minimal or no pain anticipated   Induction: Intravenous  PONV Risk Score and Plan: Propofol infusion  Airway Management Planned: Nasal Cannula and Natural Airway  Additional Equipment:   Intra-op Plan:   Post-operative Plan:   Informed Consent: I have reviewed the patients History and Physical, chart, labs and discussed the procedure including the risks, benefits and alternatives for the proposed anesthesia with the patient or authorized representative who has indicated his/her understanding and acceptance.     Dental advisory given  Plan Discussed with: CRNA and Surgeon  Anesthesia Plan Comments:         Anesthesia Quick Evaluation

## 2021-11-05 NOTE — Op Note (Signed)
[name] Name: [name] Procedure Date: 11/05/2021 12:45 PM MRN: [id number] Date of Birth: 1955-10-12 Attending MD: Norvel Richards , MD CSN: [id number] Age: 66 Admit Type: Outpatient Procedure:                Colonoscopy Indications:              Chronic diarrhea, Hematochezia Providers:                Norvel Richards, MD, Janeece Riggers, RN, Randa Spike, Technician Referring MD:              Medicines:                Propofol per Anesthesia Complications:            No immediate complications. Estimated Blood Loss:     Estimated blood loss was minimal. Procedure:                Pre-Anesthesia Assessment:                           - Prior to the procedure, a History and Physical                            was performed, and [name] medications and                            allergies were reviewed. The [name]'s tolerance of                            previous anesthesia was also reviewed. The risks                            and benefits of the procedure and the sedation                            options and risks were discussed with the [name].                            All questions were answered, and informed consent                            was obtained. ASA Grade Assessment: III - A [name]                            with severe systemic disease. After reviewing the                            risks and benefits, the [name] was deemed in                            satisfactory condition to undergo the procedure.  After obtaining informed consent, the colonoscope                            was passed under direct vision. Throughout the                            procedure, the [name]'s blood pressure, pulse, and                            oxygen saturations were monitored continuously. The                            [id number] scope was introduced through the                             anus and advanced to the the cecum, identified by                            appendiceal orifice and ileocecal valve. The                            colonoscopy was performed without difficulty. The                            [name] tolerated the procedure well. The quality                            of the bowel preparation was adequate. Scope In: 12:49:21 PM Scope Out: 12:58:37 PM Scope Withdrawal Time: 0 hours 7 minutes 1 second  Total Procedure Duration: 0 hours 9 minutes 16 seconds  Findings:      The perianal and digital rectal examinations were normal.      A 3 mm polyp was found in the distal rectum. The polyp was sessile. The       polyp was removed with a cold biopsy forceps. Resection and retrieval       were complete. Estimated blood loss was minimal.      Non-bleeding internal hemorrhoids were found during retroflexion. The       hemorrhoids were mild, small and Grade I (internal hemorrhoids that do       not prolapse).      The exam was otherwise without abnormality on direct and retroflexion       views. Distal 10 cm of terminal ileum appeared normal. Segmental       biopsies of right and left colon taken for histologic study. Impression:               - One 3 mm polyp in the distal rectum, removed with                            a cold biopsy forceps. Resected and retrieved.                           - Non-bleeding internal hemorrhoids.  Normal-appearing terminal ileum.                           - The examination was otherwise normal on direct                            and retroflexion views. Status post segmental biopsy Moderate Sedation:      Moderate (conscious) sedation was personally administered by an       anesthesia professional. The following parameters were monitored: oxygen       saturation, heart rate, blood pressure, respiratory rate, EKG, adequacy       of pulmonary ventilation, and response to care. Recommendation:            - [name] has a contact number available for                            emergencies. The signs and symptoms of potential                            delayed complications were discussed with the                            [name]. Return to normal activities tomorrow.                            Written discharge instructions were provided to the                            [name].                           - Advance diet as tolerated.                           - Continue present medications.                           - Repeat colonoscopy date to be determined after                            pending pathology results are reviewed for                            surveillance.                           - Return to GI office in 3 months. See EGD report. Procedure Code(s):        --- Professional ---                           [phone], Colonoscopy, flexible; with biopsy, single                            or multiple Diagnosis Code(s):        --- Professional ---  K62.1, Rectal polyp                           K64.0, First degree hemorrhoids                           K52.9, Noninfective gastroenteritis and colitis,                            unspecified                           K92.1, Melena (includes Hematochezia) CPT copyright 2019 [employer]. All rights reserved. The codes documented in this report are preliminary and upon coder review may  be revised to meet current compliance requirements. [name]. Atziri Zubiate, MD Norvel Richards, MD 11/05/2021 1:02:59 PM This report has been signed electronically. Number of Addenda: 0

## 2021-11-05 NOTE — Op Note (Signed)
[facility] Patient Name: [name] Procedure Date: 11/05/2021 12:27 PM MRN: [id number] Date of Birth: 01-26-1956 Attending MD: Norvel Richards , MD CSN: [id number] Age: 66 Admit Type: Outpatient Procedure:                Upper GI endoscopy Indications:              Dysphagia Providers:                Norvel Richards, MD, Janeece Riggers, RN, Randa Spike, Technician Referring MD:              Medicines:                Propofol per Anesthesia Complications:            No immediate complications. Estimated Blood Loss:     Estimated blood loss was minimal. Procedure:                Pre-Anesthesia Assessment:                           - Prior to the procedure, a History and Physical                            was performed, and patient medications and                            allergies were reviewed. The patient's tolerance of                            previous anesthesia was also reviewed. The risks                            and benefits of the procedure and the sedation                            options and risks were discussed with the patient.                            All questions were answered, and informed consent                            was obtained. ASA Grade Assessment: III - A patient                            with severe systemic disease. After reviewing the                            risks and benefits, the patient was deemed in                            satisfactory condition to undergo the procedure.  After obtaining informed consent, the endoscope was                            passed under direct vision. Throughout the                            procedure, the patient's blood pressure, pulse, and                            oxygen saturations were monitored continuously. The                            [id number] ([id number] scope was introduced through the                            mouth, and  advanced to the second part of duodenum.                            The upper GI endoscopy was accomplished without                            difficulty. The patient tolerated the procedure                            well. Scope In: 12:37:45 PM Scope Out: 12:43:25 PM Total Procedure Duration: 0 hours 5 minutes 40 seconds  Findings:      The examined esophagus was normal.      A small hiatal hernia was present.      The duodenal bulb and second portion of the duodenum were normal aside       from a single 4 mm adenomatous appearing polyp in the second portion of       the duodenum.. The scope was withdrawn. Dilation was performed with a       Maloney dilator with mild resistance at 56 Fr. The dilation site was       examined following endoscope reinsertion and showed no change. Estimated       blood loss: none. Finally, the polyp in the second portion of the       duodenum was removed with 1 pass of cold biopsy forceps. Impression:               - Normal esophagus. Dilated.                           - Small hiatal hernia.                           - Normal duodenal bulb and second portion of the                            duodenum. Except for a duodenal polyp/removed as                            described above Moderate Sedation:      Moderate (conscious) sedation was personally administered by an  anesthesia professional. The following parameters were monitored: oxygen       saturation, heart rate, blood pressure, respiratory rate, EKG, adequacy       of pulmonary ventilation, and response to care. Recommendation:           - Patient has a contact number available for                            emergencies. The signs and symptoms of potential                            delayed complications were discussed with the                            patient. Return to normal activities tomorrow.                            Written discharge instructions were provided to the                             patient.                           - Advance diet as tolerated. Follow-up on                            pathology. See colonoscopy report. Procedure Code(s):        --- Professional ---                           [phone], Esophagogastroduodenoscopy, flexible,                            transoral; diagnostic, including collection of                            specimen(s) by brushing or washing, when performed                            (separate procedure)                           43450, Dilation of esophagus, by unguided sound or                            bougie, single or multiple passes Diagnosis Code(s):        --- Professional ---                           K44.9, Diaphragmatic hernia without obstruction or                            gangrene                           R13.10, Dysphagia, unspecified CPT copyright 2019 [employer]. All rights reserved. The codes documented in this report  are preliminary and upon coder review may  be revised to meet current compliance requirements. [name]. Verlon Carcione, MD Norvel Richards, MD 11/05/2021 12:48:03 PM This report has been signed electronically. Number of Addenda: 0

## 2021-11-06 ENCOUNTER — Encounter: Payer: Self-pay | Admitting: Internal Medicine

## 2021-11-06 LAB — SURGICAL PATHOLOGY

## 2021-11-12 ENCOUNTER — Encounter (HOSPITAL_COMMUNITY): Payer: Self-pay | Admitting: Internal Medicine

## 2021-11-15 ENCOUNTER — Other Ambulatory Visit (HOSPITAL_COMMUNITY): Payer: Self-pay | Admitting: Psychiatry

## 2021-11-16 NOTE — Telephone Encounter (Signed)
Call for appt

## 2021-11-25 DIAGNOSIS — R69 Illness, unspecified: Secondary | ICD-10-CM | POA: Diagnosis not present

## 2021-12-23 ENCOUNTER — Telehealth (HOSPITAL_COMMUNITY): Payer: Self-pay

## 2021-12-23 ENCOUNTER — Other Ambulatory Visit: Payer: Self-pay | Admitting: Family Medicine

## 2021-12-23 ENCOUNTER — Other Ambulatory Visit (HOSPITAL_COMMUNITY): Payer: Self-pay | Admitting: Psychiatry

## 2021-12-23 NOTE — Telephone Encounter (Signed)
May have 6 months on all, 90-day with 1 refill

## 2021-12-23 NOTE — Telephone Encounter (Signed)
Medication refill request -Fax from patient's [facility] for a new 90 day order of his prescribed Gabapentin and Olanzapine, both last provided 07/01/21 + 1 refill.  Patient was to return in 3 months but no appointment currently scheduled.

## 2021-12-24 ENCOUNTER — Other Ambulatory Visit (HOSPITAL_COMMUNITY): Payer: Self-pay | Admitting: Psychiatry

## 2021-12-24 MED ORDER — TRAZODONE HCL 50 MG PO TABS: ORAL_TABLET | ORAL | 1 refills | Status: DC

## 2021-12-24 MED ORDER — OLANZAPINE 10 MG PO TABS: 10.0000 mg | ORAL_TABLET | Freq: Every day | ORAL | 2 refills | Status: DC

## 2021-12-24 MED ORDER — ALPRAZOLAM 0.5 MG PO TABS: ORAL_TABLET | ORAL | 2 refills | Status: DC

## 2021-12-24 NOTE — Telephone Encounter (Signed)
sent 

## 2021-12-28 ENCOUNTER — Telehealth (HOSPITAL_COMMUNITY): Payer: Self-pay

## 2021-12-28 ENCOUNTER — Ambulatory Visit: Payer: Medicare HMO | Admitting: Pulmonary Disease

## 2021-12-28 NOTE — Telephone Encounter (Signed)
Medication refill - Telephone message received from [facility] requesting a new 90 day Gabapentin 300 mg order for patient, last provided 07/01/21 + 1 refill.  Patient in need of a new appointment to be scheduled. Last seen 07/01/21.

## 2021-12-29 ENCOUNTER — Other Ambulatory Visit (HOSPITAL_COMMUNITY): Payer: Self-pay | Admitting: Psychiatry

## 2021-12-29 MED ORDER — GABAPENTIN 300 MG PO CAPS: 600.0000 mg | ORAL_CAPSULE | Freq: Three times a day (TID) | ORAL | 1 refills | Status: DC

## 2021-12-29 NOTE — Telephone Encounter (Signed)
Script sent, please call pt for appt

## 2021-12-30 NOTE — Telephone Encounter (Signed)
Appt scheduled

## 2021-12-31 ENCOUNTER — Telehealth (HOSPITAL_COMMUNITY): Payer: Medicare HMO | Admitting: Psychiatry

## 2021-12-31 ENCOUNTER — Encounter (HOSPITAL_COMMUNITY): Payer: Self-pay

## 2022-01-01 ENCOUNTER — Telehealth (INDEPENDENT_AMBULATORY_CARE_PROVIDER_SITE_OTHER): Payer: Medicare HMO | Admitting: Psychiatry

## 2022-01-01 ENCOUNTER — Encounter (HOSPITAL_COMMUNITY): Payer: Self-pay | Admitting: Psychiatry

## 2022-01-01 DIAGNOSIS — F418 Other specified anxiety disorders: Secondary | ICD-10-CM

## 2022-01-01 DIAGNOSIS — F5105 Insomnia due to other mental disorder: Secondary | ICD-10-CM

## 2022-01-01 MED ORDER — OLANZAPINE 10 MG PO TABS: 10.0000 mg | ORAL_TABLET | Freq: Every day | ORAL | 2 refills | Status: DC

## 2022-01-01 MED ORDER — TRAZODONE HCL 50 MG PO TABS: ORAL_TABLET | ORAL | 1 refills | Status: DC

## 2022-01-01 MED ORDER — ALPRAZOLAM 1 MG PO TABS: 1.0000 mg | ORAL_TABLET | Freq: Three times a day (TID) | ORAL | 2 refills | Status: DC

## 2022-01-01 MED ORDER — FLUOXETINE HCL 40 MG PO CAPS: 40.0000 mg | ORAL_CAPSULE | Freq: Two times a day (BID) | ORAL | 2 refills | Status: DC

## 2022-01-01 NOTE — Progress Notes (Signed)
Virtual Visit via Telephone Note  I connected with [name] on 01/01/22 at  9:40 AM EDT by telephone and verified that I am speaking with the correct person using two identifiers.  Location: Patient: home Provider: home office   I discussed the limitations, risks, security and privacy concerns of performing an evaluation and management service by telephone and the availability of in person appointments. I also discussed with the patient that there may be a patient responsible     I discussed the assessment and treatment plan with the patient. The patient was provided an opportunity to ask questions and all were answered. The patient agreed with the plan and demonstrated an understanding of the instructions.   The patient was advised to call back or seek an in-person evaluation if the symptoms worsen or if the condition fails to improve as anticipated.  I provided 15 minutes of non-face-to-face time during this encounter.   [name], MD  [facility] MD/PA/NP OP Progress Note  01/01/2022 10:00 AM [name]  MRN:  [id number]  Chief Complaint:  Chief Complaint  Patient presents with   Anxiety   Depression   Follow-up   HPI: This patient is a 66 year old married white male lives with his wife in a 2 grandchildren in [place].  He is on disability for coronary artery disease and COPD.  He used to work for [employer].  Patient follow-up after about 6 months.  He states that he is still struggling emotionally.  He told me last time that his 57 year old stepson died suddenly of a heart attack and he was the one who found him last year.  He can still cannot get the whole scenario out of his mind.  He thinks one of the steps of his friends gave him a drive but nothing has been proven.  He is feeling more depressed and anxious.  He does not think the current dose of Xanax is working for him he is on a total of 2 mg daily and I explained that we have to be careful since he is also on pain  medication.  We can go up a little bit on the Xanax.  I also suggested that we go a bit higher on the Prozac.  He denies any thoughts of self-harm or suicide but feels down has low energy low motivation and poor appetite. Visit Diagnosis:    ICD-10-CM   1. Depression with anxiety  F41.8     2. 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 s/p PTCA to LCx in 1993 b. BMS to RCA in 01/2012 with residual 80% OM2 stenosis and medical management recommended c. 08/2019: cath showing patent stents with occluded OM2 --> medical management. d. s/p CABG in 02/2021 with LIMA-LAD, reverse SVG-D1 and SVG-OM   Asthma    CAD (coronary artery disease)    Carcinoma in situ of colon 2004   rectal polyp   Colitis, ischemic ([place]) 2011   COPD (chronic obstructive pulmonary disease) (HCC)    mild ;excercise induced hypoxemia by cp stress test ;asthma ,bronchitis,   Depression    Diverticulosis    Gastritis 12/30/2010   EGD Dr [name]   GERD (gastroesophageal reflux disease)    GI bleed  H. 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)    Leukocytosis    [name]   Lung nodule 07/16/2011   Myocardial infarction [facility])    age 85   Restless leg syndrome    Schatzki's ring    Sleep apnea    does not use CPAP:cannot tolerate, PCP aware   Syncope    Tobacco abuse    50 pack years continuing at one halp pack daily   Tubular adenoma of colon 06/2009   Colonosocpy Dr [name]    Past Surgical History:  Procedure Laterality Date   BIOPSY  12/19/2017   Procedure: BIOPSY;  Surgeon: [name], MD;  [facility];  Service: Endoscopy;;  gastric   BIOPSY  11/05/2021    Procedure: BIOPSY;  Surgeon: [name], MD;  [facility];  Service: Endoscopy;;   CARDIAC CATHETERIZATION     CHOLECYSTECTOMY  2004   COLONOSCOPY  06/2009   normal terminal ileum, segmental mild inflammation of sigmoid colon (bx unremarkable), polyp, tubular adenoma   COLONOSCOPY N/A 07/31/2013   Dr.Rourk- redundant anal canal hemorrhoids, colonic diverticulosis, tubular adenoma   COLONOSCOPY W/ POLYPECTOMY  2004   rectal polyp with carcinoma in situ removed via colonoscopy   COLONOSCOPY WITH PROPOFOL N/A 09/15/2015   Dr. [name]: Scattered diverticula throughout the colon, 2 sessile polyps found in the descending colon and cecum, 5 mm in size.  Cecal polyp was sessile serrated polyp, descending colon polyp was a tubular adenoma.  He had a abnormal perianal exam along with grade 3 hemorrhoids.  Surveillance exam recommended for 5-year follow-up.   COLONOSCOPY WITH PROPOFOL N/A 12/19/2017   one 5 mm polyp in ascending colon and 1 cm sessile polyp in ascending s/p removal. Tubular adenoma. internal hemorrhoids   COLONOSCOPY WITH PROPOFOL N/A 11/05/2021   Procedure: COLONOSCOPY WITH PROPOFOL;  Surgeon: [name], MD;  [facility];  Service: Endoscopy;  Laterality: N/A;  12:30pm   CORONARY ANGIOPLASTY WITH STENT PLACEMENT  01/26/2012   "1; total is now 2"   CORONARY ARTERY BYPASS GRAFT N/A 03/13/2021   Procedure: CORONARY ARTERY BYPASS GRAFTING (CABG), ON PUMP, TIMES THREE, USING LEFT INTERNAL MAMMARY ARTERY AND RIGHT ENDOSCOPICALLY HARVESTED GREATER SAPHENOUS VEIN;  Surgeon: Lajuana Matte, MD;  Location: [place];  Service: Open Heart Surgery;  Laterality: N/A;   CORONARY/GRAFT ACUTE MI REVASCULARIZATION N/A 03/10/2021   Procedure: Coronary/Graft Acute MI Revascularization;  Surgeon: Belva Crome, MD;  Location: [place] [facility];  Service: Cardiovascular;  Laterality: N/A;   ENDOVEIN HARVEST OF GREATER SAPHENOUS VEIN Right 03/13/2021   Procedure: ENDOVEIN HARVEST  OF GREATER SAPHENOUS VEIN;  Surgeon: Lajuana Matte, MD;  Location: [place];  Service: Open Heart Surgery;  Laterality: Right;   ESOPHAGOGASTRODUODENOSCOPY  02/2009   query Barrett's but bx negative   ESOPHAGOGASTRODUODENOSCOPY  12/30/2010   [name],gastritis, dilated 53F, sm HH, 1 small ulcer, Duodenal erosions, benign bx   ESOPHAGOGASTRODUODENOSCOPY (EGD) WITH ESOPHAGEAL DILATION N/A 09/12/2012   [id number] Schatzki's ring s/p Maloney dilator. Small hiatal hernia. negative path   ESOPHAGOGASTRODUODENOSCOPY (EGD) WITH ESOPHAGEAL DILATION N/A 07/31/2013   Dr. [name]- normal egd, s/p [facility] dilation empirically. Normal small bowel biopsies    ESOPHAGOGASTRODUODENOSCOPY (EGD) WITH PROPOFOL N/A 09/15/2015   Dr. [name]: Medium sized hiatal hernia, normal-appearing esophagus status post  empiric dilation   ESOPHAGOGASTRODUODENOSCOPY (EGD) WITH PROPOFOL N/A 12/19/2017   erosive esophagitis s/p dilation, erosive gastropathy, normal duodenum   ESOPHAGOGASTRODUODENOSCOPY (EGD) WITH PROPOFOL N/A 09/17/2019   Non-obstructing Schatzki's ring s/p dilation, medium-sized hiatal hernia, otherwise normal.   ESOPHAGOGASTRODUODENOSCOPY (EGD) WITH PROPOFOL N/A 11/05/2021   Procedure: ESOPHAGOGASTRODUODENOSCOPY (EGD) WITH PROPOFOL;  Surgeon: [name], MD;  [facility];  Service: Endoscopy;  Laterality: N/A;   FLEXIBLE SIGMOIDOSCOPY  12/30/2010    [name],; internal hemorrhoids, anal papilla   HAND SURGERY     surgical intervention for injury of the fingers of the left hand many years ago   heart stent     HEMORRHOID BANDING     Dr. [name]   LEFT HEART CATH AND CORONARY ANGIOGRAPHY N/A 08/29/2019   Procedure: LEFT HEART CATH AND CORONARY ANGIOGRAPHY;  Surgeon: [name], MD;  Location: [place] [facility];  Service: Cardiovascular;  Laterality: N/A;   LEFT HEART CATH AND CORONARY ANGIOGRAPHY N/A 03/10/2021   Procedure: LEFT HEART CATH AND CORONARY ANGIOGRAPHY;  Surgeon: Belva Crome, MD;  Location: [place] [facility];  Service: Cardiovascular;  Laterality: N/A;   LEFT HEART CATHETERIZATION WITH CORONARY ANGIOGRAM N/A 01/26/2012   Procedure: LEFT HEART CATHETERIZATION WITH CORONARY ANGIOGRAM;  Surgeon: [name], MD;  Location: [facility] CATH LAB;  Service: Cardiovascular;  Laterality: N/A;   MALONEY DILATION N/A 09/15/2015   Procedure: [name] DILATION;  Surgeon: [name], MD;  [facility];  Service: Endoscopy;  Laterality: N/A;   MALONEY DILATION N/A 12/19/2017   Procedure: [name] DILATION;  Surgeon: [name], MD;  [facility];  Service: Endoscopy;  Laterality: N/A;   MALONEY DILATION N/A 09/17/2019   Procedure: [name] DILATION;  Surgeon: [name], MD;  [facility];  Service: Endoscopy;  Laterality: N/A;   MALONEY DILATION N/A 11/05/2021   Procedure: [name] DILATION;  Surgeon: [name], MD;  [facility];  Service: Endoscopy;  Laterality: N/A;   NASAL SEPTOPLASTY W/ TURBINOPLASTY  10/06/2011   Procedure: NASAL SEPTOPLASTY WITH TURBINATE REDUCTION;  Surgeon: Izora Gala, MD;  Location: [place];  Service: ENT;  Laterality: Bilateral;   PERCUTANEOUS CORONARY STENT INTERVENTION (PCI-S) N/A 01/26/2012   Procedure: PERCUTANEOUS CORONARY STENT INTERVENTION (PCI-S);  Surgeon: [name], MD;  Location: [facility] CATH LAB;  Service: Cardiovascular;  Laterality: N/A;   POLYPECTOMY  09/15/2015   Procedure: POLYPECTOMY;  Surgeon: [name], MD;  [facility];  Service: Endoscopy;;  Cecal polyp removed via cold snare/ Descending colon polyp removed via cold snare   POLYPECTOMY  12/19/2017   Procedure: POLYPECTOMY;  Surgeon: [name], MD;  [facility];  Service: Endoscopy;;  colon   TEE WITHOUT CARDIOVERSION N/A 03/13/2021   Procedure: TRANSESOPHAGEAL ECHOCARDIOGRAM (TEE);  Surgeon: Lajuana Matte, MD;  Location: [place];  Service: Open Heart Surgery;  Laterality: N/A;    Family  Psychiatric History: see below  Family History:  Family History  Problem Relation Age of Onset   Lung disease Father        deceased, black lung   Heart disease Mother        blood clots   Depression Mother    Cancer Paternal Uncle        unknown type   Cancer Maternal Aunt        unknown type   Kidney failure Maternal Uncle    Hypertension Brother  Colon cancer Neg Hx    ADD / ADHD Neg Hx    Alcohol abuse Neg Hx    Drug abuse Neg Hx    Anxiety disorder Neg Hx    Bipolar disorder Neg Hx    Dementia Neg Hx    OCD Neg Hx    Paranoid behavior Neg Hx    Schizophrenia Neg Hx    Physical abuse Neg Hx    Sexual abuse Neg Hx    Seizures Neg Hx     Social History:  Social History   Socioeconomic History   Marital status: Married    Spouse name: Not on file   Number of children: 3   Years of education: Not on file   Highest education level: Not on file  Occupational History   Occupation: disable    Employer: RETIRED    Comment: [employer]  Tobacco Use   Smoking status: Former    Packs/day: 1.00    Years: 48.00    Total pack years: 48.00    Types: Cigarettes    Start date: 07/31/1969    Quit date: 03/16/2021    Years since quitting: 0.7   Smokeless tobacco: Former  Scientific laboratory technician Use: Never used  Substance and Sexual Activity   Alcohol use: Yes    Comment: Drinks a beer occasionally   Drug use: No   Sexual activity: Not Currently  Other Topics Concern   Not on file  Social History Narrative   3 stepchildren   Social Determinants of Health   Financial Resource Strain: Not on file  Food Insecurity: Not on file  Transportation Needs: Not on file  Physical Activity: Not on file  Stress: Not on file  Social Connections: Not on file    Allergies: No Known Allergies  Metabolic Disorder Labs: Lab Results  Component Value Date   HGBA1C 5.9 (H) 07/21/2021   MPG 94 03/11/2021   MPG 103 10/29/2013   No results found for: "PROLACTIN" Lab Results  Component  Value Date   CHOL 158 07/21/2021   TRIG 99 07/21/2021   HDL 44 07/21/2021   CHOLHDL 3.6 07/21/2021   VLDL 16 03/11/2021   LDLCALC 96 07/21/2021   LDLCALC 27 03/11/2021   Lab Results  Component Value Date   TSH 1.154 03/11/2021   TSH 1.011 02/09/2010    Therapeutic Level Labs: No results found for: "LITHIUM" No results found for: "VALPROATE" No results found for: "CBMZ"  Current Medications: Current Outpatient Medications  Medication Sig Dispense Refill   ALPRAZolam (XANAX) 1 MG tablet Take 1 tablet (1 mg total) by mouth 3 (three) times daily. 90 tablet 2   albuterol (VENTOLIN HFA) 108 (90 Base) MCG/ACT inhaler Inhale 2 puffs into the lungs every 4 (four) hours as needed for wheezing or shortness of breath. 18 g 1   amLODipine (NORVASC) 5 MG tablet 1 take tablet ('5mg'$ ) by mouth Once a day. 90 tablet 1   aspirin EC 81 MG tablet Take 81 mg by mouth daily.     atorvastatin (LIPITOR) 80 MG tablet TAKE 1 TABLET BY MOUTH EVERY DAY 90 tablet 3   clopidogrel (PLAVIX) 75 MG tablet Take 1 tablet (75 mg total) by mouth daily. 30 tablet 11   Cyanocobalamin (VITAMIN B-12 PO) Take 1 tablet by mouth in the morning.     FLUoxetine (PROZAC) 40 MG capsule Take 1 capsule (40 mg total) by mouth 2 (two) times daily. 180 capsule 2   Fluticasone-Umeclidin-Vilant (TRELEGY ELLIPTA)  100-62.5-25 MCG/ACT AEPB Inhale 1 puff into the lungs daily. 180 each 3   gabapentin (NEURONTIN) 300 MG capsule Take 2 capsules (600 mg total) by mouth 3 (three) times daily. 540 capsule 1   HYDROcodone-acetaminophen (NORCO/VICODIN) 5-325 MG tablet 1 twice daily as needed 45 tablet 0   HYDROcodone-acetaminophen (NORCO/VICODIN) 5-325 MG tablet Take 1 tablet by mouth 2 (two) times daily as needed for moderate pain. (Patient not taking: Reported on 11/04/2021) 45 tablet 0   HYDROcodone-acetaminophen (NORCO/VICODIN) 5-325 MG tablet Take 1 tablet by mouth 2 (two) times daily as needed for moderate pain. (Patient not taking: Reported on  11/04/2021) 45 tablet 0   HYDROcodone-acetaminophen (NORCO/VICODIN) 5-325 MG tablet Take 1 tablet by mouth 2 (two) times daily as needed for moderate pain. (Patient not taking: Reported on 11/04/2021) 45 tablet 0   isosorbide mononitrate (IMDUR) 30 MG 24 hr tablet Take 30 mg by mouth daily.     lipase/protease/amylase (CREON) 36000 UNITS CPEP capsule Take 2 capsules (72,000 Units total) by mouth 3 (three) times daily with meals. May also take 1 capsule (36,000 Units total) as needed (with snacks). (Patient taking differently: Take 2 capsules (72,000 Units total) by mouth 3 (three) times daily with meals.) 240 capsule 3   meloxicam (MOBIC) 15 MG tablet TAKE 1 TABLET EVERY DAY 90 tablet 1   metoprolol succinate (TOPROL-XL) 50 MG 24 hr tablet TAKE 1 TABLET BY MOUTH DAILY 90 tablet 1   nitroGLYCERIN (NITROSTAT) 0.4 MG SL tablet Place 1 tablet (0.4 mg total) under the tongue every 5 (five) minutes as needed for chest pain. 20 tablet 5   OLANZapine (ZYPREXA) 10 MG tablet Take 1 tablet (10 mg total) by mouth at bedtime. 90 tablet 2   pantoprazole (PROTONIX) 40 MG tablet TAKE 1 TABLET BY MOUTH TWICE A DAY BEFORE A MEAL 180 tablet 3   polyethylene glycol-electrolytes (NULYTELY) 420 g solution As directed 4000 mL 0   rOPINIRole (REQUIP) 2 MG tablet TAKE 1 TABLET AT BEDTIME 90 tablet 1   tamsulosin (FLOMAX) 0.4 MG CAPS capsule TAKE 1 CAPSULE EVERY DAY 90 capsule 1   traZODone (DESYREL) 50 MG tablet TAKE 1 TABLET AT BEDTIME 90 tablet 1   valsartan (DIOVAN) 40 MG tablet Take 40 mg by mouth daily.     valsartan (DIOVAN) 80 MG tablet TAKE 1 TABLET EVERY DAY. DOSE CHANGE 90 tablet 1   No current facility-administered medications for this visit.     Musculoskeletal: Strength & Muscle Tone: na Gait & Station: na Patient leans: N/A  Psychiatric Specialty Exam: Review of Systems  Musculoskeletal:  Positive for arthralgias and back pain.  Psychiatric/Behavioral:  Positive for decreased concentration, dysphoric  mood and sleep disturbance. The patient is nervous/anxious.   All other systems reviewed and are negative.   There were no vitals taken for this visit.There is no height or weight on file to calculate BMI.  General Appearance: NA  Eye Contact:  NA  Speech:  Clear and Coherent  Volume:  Normal  Mood:  Anxious and Depressed  Affect:  NA  Thought Process:  Goal Directed  Orientation:  Full (Time, Place, and Person)  Thought Content: Rumination   Suicidal Thoughts:  No  Homicidal Thoughts:  No  Memory:  Immediate;   Good Recent;   Good Remote;   Fair  Judgement:  Good  Insight:  Fair  Psychomotor Activity:  Decreased  Concentration:  Concentration: Fair and Attention Span: Fair  Recall:  [employer] of Knowledge: Good  Language: Good  Akathisia:  No  Handed:  Right  AIMS (if indicated): not done  Assets:  Communication Skills Desire for Improvement Resilience Social Support  ADL's:  Intact  Cognition: WNL  Sleep:  Poor   Screenings: GAD-7    Flowsheet Row Office Visit from 01/05/2021 in [place] Office Visit from 01/16/2020 in [place] Office Visit from 03/30/2018 in [place]  Total GAD-7 Score '13 13 4      '$ PHQ2-9    Flowsheet Row Video Visit from 01/01/2022 in [facility] [facility] Video Visit from 07/01/2021 in [place] Office Visit from 01/05/2021 in [place] Video Visit from 10/07/2020 in [place] Office Visit from 10/06/2020 in [facility]  PHQ-2 Total Score '6 1 3 1 '$ 0  PHQ-9 Total Score 14 -- '11 5 2      '$ Flowsheet Row Video Visit from 01/01/2022 in [place] Admission (Discharged) from 11/05/2021 in [place] [facility] from 11/03/2021 in [place] CATEGORY No  Risk No Risk No Risk        Assessment and Plan: This patient is a 66 year old male with a history of depression anxiety and PTSD regarding both of his children's deaths.  He is more anxious and depressed.  He will increase Prozac to 80 mg daily for depression, increase Xanax to 1 mg 3 times daily for anxiety, continue gabapentin 600 mg twice daily for anxiety olanzapine 10 mg at bedtime for mood stabilization.  He will return to see me in 6 weeks.  Collaboration of Care: Collaboration of Care: Primary Care Provider AEB notes are available to PCP on the epic system  Patient/Guardian was advised Release of Information must be obtained prior to any record release in order to collaborate their care with an outside provider. Patient/Guardian was advised if they have not already done so to contact the registration department to sign all necessary forms in order for [place] to release information regarding their care.   Consent: Patient/Guardian gives verbal consent for treatment and assignment of benefits for services provided during this visit. Patient/Guardian expressed understanding and agreed to proceed.    [name], MD 01/01/2022, 10:00 AM

## 2022-01-07 ENCOUNTER — Telehealth: Payer: Self-pay | Admitting: Family Medicine

## 2022-01-07 NOTE — Telephone Encounter (Signed)
LM with woman NHA not available 01/12/22- we will call when replacement is hired to reschedule.

## 2022-01-20 ENCOUNTER — Ambulatory Visit (INDEPENDENT_AMBULATORY_CARE_PROVIDER_SITE_OTHER): Payer: Medicare HMO | Admitting: Family Medicine

## 2022-01-20 VITALS — BP 120/70 | Ht 70.0 in | Wt 173.2 lb

## 2022-01-20 DIAGNOSIS — I10 Essential (primary) hypertension: Secondary | ICD-10-CM | POA: Diagnosis not present

## 2022-01-20 DIAGNOSIS — J449 Chronic obstructive pulmonary disease, unspecified: Secondary | ICD-10-CM

## 2022-01-20 DIAGNOSIS — Z79891 Long term (current) use of opiate analgesic: Secondary | ICD-10-CM

## 2022-01-20 MED ORDER — HYDROCODONE-ACETAMINOPHEN 5-325 MG PO TABS: 1.0000 | ORAL_TABLET | Freq: Two times a day (BID) | ORAL | 0 refills | Status: DC | PRN

## 2022-01-20 MED ORDER — AMLODIPINE BESYLATE 5 MG PO TABS: ORAL_TABLET | ORAL | 1 refills | Status: DC

## 2022-01-20 NOTE — Progress Notes (Signed)
   Subjective:    Patient ID: [name], male    DOB: June 23, 1955, 66 y.o.   MRN: [id number]  HPI This patient was seen today for chronic pain  The medication list was reviewed and updated.  Location of Pain for which the patient has been treated with regarding narcotics: back pain  Onset of this pain: years   -Compliance with medication: yes  - Number patient states they take daily: 1  -when was the last dose patient took?   The patient was advised the importance of maintaining medication and not using illegal substances with these.  Here for refills and follow up  The patient was educated that we can provide 3 monthly scripts for their medication, it is their responsibility to follow the instructions.  Side effects or complications from medications: none  Patient is aware that pain medications are meant to minimize the severity of the pain to allow their pain levels to improve to allow for better function. They are aware of that pain medications cannot totally remove their pain.  Due for UDT ( at least once per year) : due today         Review of Systems     Objective:   Physical Exam General-in no acute distress Eyes-no discharge Lungs-respiratory rate normal, CTA CV-no murmurs,RRR Extremities skin warm dry no edema Neuro grossly normal Behavior normal, alert        Assessment & Plan:  1. Encounter for long-term opiate analgesic use The patient was seen in followup for chronic pain. A review over at their current pain status was discussed. Drug registry was checked. Prescriptions were given.  Regular follow-up recommended. Discussion was held regarding the importance of compliance with medication as well as pain medication contract.  Patient was informed that medication may cause drowsiness and should not be combined  with other medications/alcohol or street drugs. If the patient feels medication is causing altered alertness then do not drive or  operate dangerous equipment.  Should be noted that the patient appears to be meeting appropriate use of opioids and response.  Evidenced by improved function and decent pain control without significant side effects and no evidence of overt aberrancy issues.  Upon discussion with the patient today they understand that opioid therapy is optional and they feel that the pain has been refractory to reasonable conservative measures and is significant and affecting quality of life enough to warrant ongoing therapy and wishes to continue opioids.  Refills were provided. Patient is doing well with his medicine he denies abusing it he states it does help him compensate for his pain and discomfort allows him to function better around the house we will continue the medication - ToxASSURE Select 12(MW)  2. Essential hypertension Blood pressure is under good control refill was given previous labs reviewed no additional labs necessary  3. Stage 2 moderate COPD by GOLD classification ([name]) COPD is stable patient relates he is staying away from smoking with an occasional relapse but not frequently patient was encouraged to stay away from smoking and continue to use his inhalers.  Follow-up in 3 months

## 2022-01-23 ENCOUNTER — Other Ambulatory Visit: Payer: Self-pay | Admitting: Physician Assistant

## 2022-01-23 ENCOUNTER — Other Ambulatory Visit: Payer: Self-pay | Admitting: Family Medicine

## 2022-01-24 LAB — TOXASSURE SELECT 12(MW)

## 2022-01-25 ENCOUNTER — Encounter: Payer: Self-pay | Admitting: Family Medicine

## 2022-01-25 NOTE — Progress Notes (Signed)
Please mail to the patient 

## 2022-01-28 ENCOUNTER — Other Ambulatory Visit: Payer: Self-pay | Admitting: Family Medicine

## 2022-01-28 DIAGNOSIS — R69 Illness, unspecified: Secondary | ICD-10-CM | POA: Diagnosis not present

## 2022-01-30 ENCOUNTER — Emergency Department (HOSPITAL_COMMUNITY): Payer: Medicare HMO

## 2022-01-30 ENCOUNTER — Emergency Department (HOSPITAL_COMMUNITY)
Admission: EM | Admit: 2022-01-30 | Discharge: 2022-01-30 | Disposition: A | Discharge status: Home / Self Care | Payer: Medicare HMO | Attending: Emergency Medicine | Admitting: Emergency Medicine

## 2022-01-30 ENCOUNTER — Encounter (HOSPITAL_COMMUNITY): Payer: Self-pay | Admitting: Emergency Medicine

## 2022-01-30 DIAGNOSIS — Z79899 Other long term (current) drug therapy: Secondary | ICD-10-CM | POA: Insufficient documentation

## 2022-01-30 DIAGNOSIS — S61217A Laceration without foreign body of left little finger without damage to nail, initial encounter: Secondary | ICD-10-CM | POA: Insufficient documentation

## 2022-01-30 DIAGNOSIS — Z7982 Long term (current) use of aspirin: Secondary | ICD-10-CM | POA: Diagnosis not present

## 2022-01-30 DIAGNOSIS — Z23 Encounter for immunization: Secondary | ICD-10-CM | POA: Insufficient documentation

## 2022-01-30 DIAGNOSIS — W260XXA Contact with knife, initial encounter: Secondary | ICD-10-CM | POA: Insufficient documentation

## 2022-01-30 DIAGNOSIS — S6992XA Unspecified injury of left wrist, hand and finger(s), initial encounter: Secondary | ICD-10-CM | POA: Diagnosis present

## 2022-01-30 DIAGNOSIS — Z89022 Acquired absence of left finger(s): Secondary | ICD-10-CM | POA: Diagnosis not present

## 2022-01-30 MED ORDER — LIDOCAINE HCL (PF) 1 % IJ SOLN
5.0000 mL | Freq: Once | INTRAMUSCULAR | Status: DC
  Filled 2022-01-30: qty 5

## 2022-01-30 MED ORDER — TETANUS-DIPHTH-ACELL PERTUSSIS 5-2.5-18.5 LF-MCG/0.5 IM SUSY
0.5000 mL | PREFILLED_SYRINGE | Freq: Once | INTRAMUSCULAR | Status: AC
  Administered 2022-01-30: 0.5 mL via INTRAMUSCULAR
  Filled 2022-01-30: qty 0.5

## 2022-01-30 NOTE — Discharge Instructions (Signed)
You can use Tylenol or ibuprofen for pain.  Please return to the emergency department, urgent care, or your primary care doctor in 7 to 10 days for suture removal.  Please do not submerge the wound in water.  You can wash normally.  Return to the emergency department for any worsening symptoms.

## 2022-01-30 NOTE — ED Triage Notes (Signed)
Pt reports cutting the anterior base of little finger with a knife while cutting a piece of pipe about 1 hr ago. Pt is on blood thinners. Bleeding controlled at this time. Unsure of last tetanus shot.

## 2022-01-30 NOTE — ED Provider Notes (Signed)
[facility] EMERGENCY DEPARTMENT Provider Note   CSN: [id number] Arrival date & time: 01/30/22  1722     History No chief complaint on file.   ARBIE BLANKLEY is a 66 y.o. male patient who presents to the emergency department today for further evaluation of a left fifth finger laceration.  Patient states he was working with some pipe and accidentally cut it with a box knife.  Patient is anticoagulated.  Unknown tetanus.  Bleeding is controlled with direct pressure.  HPI     Home Medications Prior to Admission medications   Medication Sig Start Date End Date Taking? Authorizing Provider  albuterol (VENTOLIN HFA) 108 (90 Base) MCG/ACT inhaler Inhale 2 puffs into the lungs every 4 (four) hours as needed for wheezing or shortness of breath. 10/30/19   Tanda Rockers, MD  ALPRAZolam Duanne Moron) 1 MG tablet Take 1 tablet (1 mg total) by mouth 3 (three) times daily. 01/01/22 01/01/23  Cloria Spring, MD  amLODipine (NORVASC) 5 MG tablet 1 take tablet ('5mg'$ ) by mouth Once a day. 01/20/22   [name], MD  aspirin EC 81 MG tablet Take 81 mg by mouth daily.    [provider]  atorvastatin (LIPITOR) 80 MG tablet TAKE 1 TABLET BY MOUTH EVERY DAY 07/20/21   Arnoldo Lenis, MD  clopidogrel (PLAVIX) 75 MG tablet Take 1 tablet (75 mg total) by mouth daily. 03/19/21   Antony Odea, PA-C  Cyanocobalamin (VITAMIN B-12 PO) Take 1 tablet by mouth in the morning.    [provider]  FLUoxetine (PROZAC) 40 MG capsule Take 1 capsule (40 mg total) by mouth 2 (two) times daily. 01/01/22   Cloria Spring, MD  Fluticasone-Umeclidin-Vilant (TRELEGY ELLIPTA) 100-62.5-25 MCG/ACT AEPB Inhale 1 puff into the lungs daily. 09/21/21   Martyn Ehrich, NP  gabapentin (NEURONTIN) 300 MG capsule Take 2 capsules (600 mg total) by mouth 3 (three) times daily. 12/29/21   Cloria Spring, MD  HYDROcodone-acetaminophen (NORCO/VICODIN) 5-325 MG tablet Take 1 tablet by mouth 2 (two) times daily as needed  for moderate pain. 01/20/22   [name], MD  HYDROcodone-acetaminophen (NORCO/VICODIN) 5-325 MG tablet Take 1 tablet by mouth 2 (two) times daily as needed for moderate pain. 01/20/22   [name], MD  HYDROcodone-acetaminophen (NORCO/VICODIN) 5-325 MG tablet Take 1 tablet by mouth 2 (two) times daily as needed for moderate pain. 01/20/22   [name], MD  isosorbide mononitrate (IMDUR) 30 MG 24 hr tablet Take 30 mg by mouth daily. 04/16/21   [provider]  lipase/protease/amylase (CREON) 36000 UNITS CPEP capsule Take 2 capsules (72,000 Units total) by mouth 3 (three) times daily with meals. May also take 1 capsule (36,000 Units total) as needed (with snacks). Patient taking differently: Take 2 capsules (72,000 Units total) by mouth 3 (three) times daily with meals. 08/24/21   [name], MD  meloxicam (MOBIC) 15 MG tablet TAKE 1 TABLET EVERY DAY 12/23/21   [name], MD  metoprolol succinate (TOPROL-XL) 50 MG 24 hr tablet TAKE 1 TABLET BY MOUTH DAILY 09/18/21   Arnoldo Lenis, MD  nitroGLYCERIN (NITROSTAT) 0.4 MG SL tablet Place 1 tablet (0.4 mg total) under the tongue every 5 (five) minutes as needed for chest pain. 10/06/20   [name], MD  OLANZapine (ZYPREXA) 10 MG tablet Take 1 tablet (10 mg total) by mouth at bedtime. 01/01/22 01/01/23  Cloria Spring, MD  pantoprazole (PROTONIX) 40 MG tablet  TAKE 1 TABLET BY MOUTH TWICE A DAY BEFORE A MEAL 07/20/21   Mahala Menghini, PA-C  polyethylene glycol-electrolytes (NULYTELY) 420 g solution As directed 09/17/21   Rourk, [name], MD  rOPINIRole (REQUIP) 2 MG tablet TAKE 1 TABLET AT BEDTIME 12/23/21   [name], MD  tamsulosin (FLOMAX) 0.4 MG CAPS capsule TAKE 1 CAPSULE EVERY DAY 12/23/21   [name], MD  traZODone (DESYREL) 50 MG tablet TAKE 1 TABLET AT BEDTIME 01/01/22   Cloria Spring, MD  valsartan (DIOVAN) 40 MG tablet TAKE 1 TABLET BY MOUTH EVERY DAY 01/28/22   [name], MD  valsartan (DIOVAN) 80 MG  tablet TAKE 1 TABLET EVERY DAY. DOSE CHANGE 12/23/21   [name], MD      Allergies    Patient has no known allergies.    Review of Systems   Review of Systems  All other systems reviewed and are negative.   Physical Exam Updated Vital Signs BP 127/81   Pulse 64   Temp 97.9 F (36.6 C)   Resp 16   Ht '5\' 10"'$  (1.778 m)   Wt 78.6 kg   SpO2 98%   BMI 24.85 kg/m  Physical Exam Vitals and nursing note reviewed.  Constitutional:      Appearance: Normal appearance.  HENT:     Head: Normocephalic and atraumatic.  Eyes:     General:        Right eye: No discharge.        Left eye: No discharge.     Conjunctiva/sclera: Conjunctivae normal.  Pulmonary:     Effort: Pulmonary effort is normal.  Skin:    General: Skin is warm and dry.     Findings: No rash.     Comments: 2 cm laceration to the left fifth finger just inferior to the DIP.  Patient has full range of motion and is able to flex with resistance.  Neurological:     General: No focal deficit present.     Mental Status: He is alert.  Psychiatric:        Mood and Affect: Mood normal.        Behavior: Behavior normal.     ED Results / Procedures / Treatments   Labs (all labs ordered are listed, but only abnormal results are displayed) Labs Reviewed - No data to display  EKG None  Radiology DG Finger Little Left  Result Date: 01/30/2022 CLINICAL DATA:  Laceration, rule out foreign body EXAM: LEFT LITTLE FINGER 2+V COMPARISON:  None Available. FINDINGS: Evidence of remote partial amputation of the left little finger at the level of the middle phalanx. No acute fracture, subluxation or dislocation. No radiopaque foreign bodies. 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 No acute bony abnormality. Electronically Signed   By: Rolm Baptise M.D.   On: 01/30/2022 19:18    Procedures .Marland KitchenLaceration Repair  Date/Time: 01/30/2022 8:20 PM  Performed by: Hendricks Limes, PA-C Authorized by:  Hendricks Limes, PA-C   Consent:    Consent obtained:  Verbal   Consent given by:  Patient   Risks, benefits, and alternatives were discussed: yes     Risks discussed:  Infection and pain   Alternatives discussed:  No treatment Universal protocol:    Procedure explained and questions answered to patient or proxy's satisfaction: yes     Relevant documents present and verified: yes     Test results available: yes     Imaging  studies available: yes     Required blood products, implants, devices, and special equipment available: no     Site/side marked: no     Immediately prior to procedure, a time out was called: no     Patient identity confirmed:  Verbally with patient and arm band Anesthesia:    Anesthesia method:  Local infiltration   Local anesthetic:  Lidocaine 1% w/o epi Laceration details:    Location:  Finger   Finger location:  L small finger   Length (cm):  2   Depth (mm):  2 Pre-procedure details:    Preparation:  Patient was prepped and draped in usual sterile fashion Exploration:    Limited defect created (wound extended): no     Hemostasis achieved with:  Direct pressure   Imaging obtained: x-ray     Imaging outcome: foreign body not noted     Wound exploration: wound explored through full range of motion and entire depth of wound visualized     Wound extent: areolar tissue violated     Wound extent: no fascia violation noted, no foreign bodies/material noted, no muscle damage noted, no nerve damage noted, no tendon damage noted, no underlying fracture noted and no vascular damage noted   Treatment:    Area cleansed with:  Povidone-iodine   Amount of cleaning:  Standard   Irrigation solution:  Sterile water   Irrigation volume:  .5L   Irrigation method:  Pressure wash   Visualized foreign bodies/material removed: no     Debridement:  None   Scar revision: no   Skin repair:    Repair method:  Sutures   Suture size:  5-0   Suture material:  Nylon   Suture  technique:  Simple interrupted   Number of sutures:  5 Approximation:    Approximation:  Close Repair type:    Repair type:  Simple Post-procedure details:    Dressing:  Open (no dressing)   Procedure completion:  Tolerated well, no immediate complications     Medications Ordered in ED Medications  lidocaine (PF) (XYLOCAINE) 1 % injection 5 mL (has no administration in time range)  Tdap (BOOSTRIX) injection 0.5 mL (0.5 mLs Intramuscular Given 01/30/22 1843)    ED Course/ Medical Decision Making/ A&P Clinical Course as of 01/30/22 2024  Sat Jan 30, 2022  1923 DG Finger Little Left No bony abnormality or foreign body.  I personally ordered and interpret the study.  I agree with the radiologist interpretation. [CF]    Clinical Course User Index [CF] Hendricks Limes, PA-C                           Medical Decision Making HEARL HEIKES is a 66 y.o. male patient who [facility] for further evaluation of a finger laceration.  I will get x-ray to evaluate for foreign body.  Patient is anticoagulated however, he has not taken his anticoagulants in 3 to 4 weeks as he had to get a tooth pulled.  He cannot member the name.  I personally ordered and interpreted x-ray of the left fifth finger which does not show any evidence of bony involvement.  I do agree with radiologist interpretation.  I personally closed the wound with sutures.  Please see procedure note above for further detail.  He is safe for discharge.  He will come back in 7 to 10 days for suture removal.  Wound care was discussed  at bedside.   Amount and/or Complexity of Data Reviewed Radiology: ordered. Decision-making details documented in ED Course.  Risk Prescription drug management.   Final Clinical Impression(s) / ED Diagnoses Final diagnoses:  Laceration of left little finger without foreign body without damage to nail, initial encounter    Rx / DC Orders ED Discharge Orders     None          Cherrie Gauze 01/30/22 2024    Milton Ferguson, MD 02/01/22 1500

## 2022-02-07 NOTE — Progress Notes (Deleted)
GI Office Note    Referring Provider: [name], MD Primary Care Physician:  [name], MD  Primary Gastroenterologist:  Chief Complaint   No chief complaint on file.   History of Present Illness   [name] is a 66 y.o. male presenting today for follow up. Last seen in 08/2021.    EGD 10/2021: - Normal esophagus. Dilated. - Small hiatal hernia. - Normal duodenal bulb and second portion of the duodenum. Except for a duodenal polyp/removed as described above. Biopsy showed reactive duodenal mucosa with lymphoid aggregate.   Colonoscopy 10/2021: - One 3 mm polyp in the distal rectum, removed with a cold biopsy forceps. Resected and retrieved. Hyperplastic polyp. - Non-bleeding internal hemorrhoids. Normal-appearing terminal ileum. - The examination was otherwise normal on direct and retroflexion views. Status post segmental biopsies. Benign. -next colonoscopy in 5 years.   Medications   Current Outpatient Medications  Medication Sig Dispense Refill   albuterol (VENTOLIN HFA) 108 (90 Base) MCG/ACT inhaler Inhale 2 puffs into the lungs every 4 (four) hours as needed for wheezing or shortness of breath. 18 g 1   ALPRAZolam (XANAX) 1 MG tablet Take 1 tablet (1 mg total) by mouth 3 (three) times daily. 90 tablet 2   amLODipine (NORVASC) 5 MG tablet 1 take tablet ('5mg'$ ) by mouth Once a day. 90 tablet 1   aspirin EC 81 MG tablet Take 81 mg by mouth daily.     atorvastatin (LIPITOR) 80 MG tablet TAKE 1 TABLET BY MOUTH EVERY DAY 90 tablet 3   clopidogrel (PLAVIX) 75 MG tablet Take 1 tablet (75 mg total) by mouth daily. 30 tablet 11   Cyanocobalamin (VITAMIN B-12 PO) Take 1 tablet by mouth in the morning.     FLUoxetine (PROZAC) 40 MG capsule Take 1 capsule (40 mg total) by mouth 2 (two) times daily. 180 capsule 2   Fluticasone-Umeclidin-Vilant (TRELEGY ELLIPTA) 100-62.5-25 MCG/ACT AEPB Inhale 1 puff into the lungs daily. 180 each 3   gabapentin (NEURONTIN) 300 MG  capsule Take 2 capsules (600 mg total) by mouth 3 (three) times daily. 540 capsule 1   HYDROcodone-acetaminophen (NORCO/VICODIN) 5-325 MG tablet Take 1 tablet by mouth 2 (two) times daily as needed for moderate pain. 45 tablet 0   HYDROcodone-acetaminophen (NORCO/VICODIN) 5-325 MG tablet Take 1 tablet by mouth 2 (two) times daily as needed for moderate pain. 45 tablet 0   HYDROcodone-acetaminophen (NORCO/VICODIN) 5-325 MG tablet Take 1 tablet by mouth 2 (two) times daily as needed for moderate pain. 45 tablet 0   isosorbide mononitrate (IMDUR) 30 MG 24 hr tablet Take 30 mg by mouth daily.     lipase/protease/amylase (CREON) 36000 UNITS CPEP capsule Take 2 capsules (72,000 Units total) by mouth 3 (three) times daily with meals. May also take 1 capsule (36,000 Units total) as needed (with snacks). (Patient taking differently: Take 2 capsules (72,000 Units total) by mouth 3 (three) times daily with meals.) 240 capsule 3   meloxicam (MOBIC) 15 MG tablet TAKE 1 TABLET EVERY DAY 90 tablet 1   metoprolol succinate (TOPROL-XL) 50 MG 24 hr tablet TAKE 1 TABLET BY MOUTH DAILY 90 tablet 1   nitroGLYCERIN (NITROSTAT) 0.4 MG SL tablet Place 1 tablet (0.4 mg total) under the tongue every 5 (five) minutes as needed for chest pain. 20 tablet 5   OLANZapine (ZYPREXA) 10 MG tablet Take 1 tablet (10 mg total) by mouth at bedtime. 90 tablet 2   pantoprazole (PROTONIX) 40 MG  tablet TAKE 1 TABLET BY MOUTH TWICE A DAY BEFORE A MEAL 180 tablet 3   polyethylene glycol-electrolytes (NULYTELY) 420 g solution As directed 4000 mL 0   rOPINIRole (REQUIP) 2 MG tablet TAKE 1 TABLET AT BEDTIME 90 tablet 1   tamsulosin (FLOMAX) 0.4 MG CAPS capsule TAKE 1 CAPSULE EVERY DAY 90 capsule 1   traZODone (DESYREL) 50 MG tablet TAKE 1 TABLET AT BEDTIME 90 tablet 1   valsartan (DIOVAN) 40 MG tablet TAKE 1 TABLET BY MOUTH EVERY DAY 90 tablet 1   valsartan (DIOVAN) 80 MG tablet TAKE 1 TABLET EVERY DAY. DOSE CHANGE 90 tablet 1   No current  facility-administered medications for this visit.    Allergies   Allergies as of 02/08/2022   (No Known Allergies)     Past Medical History   Past Medical History:  Diagnosis Date   Anxiety    Anxiety and depression    ASCVD (arteriosclerotic cardiovascular disease)    a. s/p PTCA to LCx in 1993 b. BMS to RCA in 01/2012 with residual 80% OM2 stenosis and medical management recommended c. 08/2019: cath showing patent stents with occluded OM2 --> medical management. d. s/p CABG in 02/2021 with LIMA-LAD, reverse SVG-D1 and SVG-OM   Asthma    CAD (coronary artery disease)    Carcinoma in situ of colon 2004   rectal polyp   Colitis, ischemic ([place]) 2011   COPD (chronic obstructive pulmonary disease) (HCC)    mild ;excercise induced hypoxemia by cp stress test ;asthma ,bronchitis,   Depression    Diverticulosis    Gastritis 12/30/2010   EGD Dr [name]   GERD (gastroesophageal reflux disease)    GI bleed    H. 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)    Leukocytosis    [name]   Lung nodule 07/16/2011   Myocardial infarction [facility])    age 38   Restless leg syndrome    Schatzki's ring    Sleep apnea    does not use CPAP:cannot tolerate, PCP aware   Syncope    Tobacco abuse    50 pack years continuing at one halp pack daily   Tubular adenoma of colon 06/2009   Colonosocpy Dr [name]    Past Surgical History   Past Surgical History:  Procedure Laterality Date   BIOPSY  12/19/2017   Procedure: BIOPSY;  Surgeon: [name], MD;  [facility];  Service: Endoscopy;;  gastric   BIOPSY  11/05/2021   Procedure: BIOPSY;  Surgeon: [name], MD;  [facility];  Service: Endoscopy;;   CARDIAC CATHETERIZATION     CHOLECYSTECTOMY  2004   COLONOSCOPY  06/2009   normal terminal ileum, segmental mild inflammation of sigmoid colon  (bx unremarkable), polyp, tubular adenoma   COLONOSCOPY N/A 07/31/2013   Dr.Rourk- redundant anal canal hemorrhoids, colonic diverticulosis, tubular adenoma   COLONOSCOPY W/ POLYPECTOMY  2004   rectal polyp with carcinoma in situ removed via colonoscopy   COLONOSCOPY WITH PROPOFOL N/A 09/15/2015   Dr. [name]: Scattered diverticula throughout the colon, 2 sessile polyps found in the descending colon and cecum, 5 mm in size.  Cecal polyp was sessile serrated polyp, descending colon polyp was a tubular adenoma.  He had a abnormal perianal exam along with grade 3 hemorrhoids.  Surveillance exam recommended for 5-year  follow-up.   COLONOSCOPY WITH PROPOFOL N/A 12/19/2017   one 5 mm polyp in ascending colon and 1 cm sessile polyp in ascending s/p removal. Tubular adenoma. internal hemorrhoids   COLONOSCOPY WITH PROPOFOL N/A 11/05/2021   Procedure: COLONOSCOPY WITH PROPOFOL;  Surgeon: [name], MD;  [facility];  Service: Endoscopy;  Laterality: N/A;  12:30pm   CORONARY ANGIOPLASTY WITH STENT PLACEMENT  01/26/2012   "1; total is now 2"   CORONARY ARTERY BYPASS GRAFT N/A 03/13/2021   Procedure: CORONARY ARTERY BYPASS GRAFTING (CABG), ON PUMP, TIMES THREE, USING LEFT INTERNAL MAMMARY ARTERY AND RIGHT ENDOSCOPICALLY HARVESTED GREATER SAPHENOUS VEIN;  Surgeon: Lajuana Matte, MD;  Location: [place];  Service: Open Heart Surgery;  Laterality: N/A;   CORONARY/GRAFT ACUTE MI REVASCULARIZATION N/A 03/10/2021   Procedure: Coronary/Graft Acute MI Revascularization;  Surgeon: Belva Crome, MD;  Location: [place] [facility];  Service: Cardiovascular;  Laterality: N/A;   ENDOVEIN HARVEST OF GREATER SAPHENOUS VEIN Right 03/13/2021   Procedure: ENDOVEIN HARVEST OF GREATER SAPHENOUS VEIN;  Surgeon: Lajuana Matte, MD;  Location: [place];  Service: Open Heart Surgery;  Laterality: Right;   ESOPHAGOGASTRODUODENOSCOPY  02/2009   query Barrett's but bx negative   ESOPHAGOGASTRODUODENOSCOPY  12/30/2010    [name],gastritis, dilated 110F, sm HH, 1 small ulcer, Duodenal erosions, benign bx   ESOPHAGOGASTRODUODENOSCOPY (EGD) WITH ESOPHAGEAL DILATION N/A 09/12/2012   LOV:FIEPPIRJJOA Schatzki's ring s/p Maloney dilator. Small hiatal hernia. negative path   ESOPHAGOGASTRODUODENOSCOPY (EGD) WITH ESOPHAGEAL DILATION N/A 07/31/2013   Dr. [name]- normal egd, s/p [facility] dilation empirically. Normal small bowel biopsies    ESOPHAGOGASTRODUODENOSCOPY (EGD) WITH PROPOFOL N/A 09/15/2015   Dr. [name]: Medium sized hiatal hernia, normal-appearing esophagus status post empiric dilation   ESOPHAGOGASTRODUODENOSCOPY (EGD) WITH PROPOFOL N/A 12/19/2017   erosive esophagitis s/p dilation, erosive gastropathy, normal duodenum   ESOPHAGOGASTRODUODENOSCOPY (EGD) WITH PROPOFOL N/A 09/17/2019   Non-obstructing Schatzki's ring s/p dilation, medium-sized hiatal hernia, otherwise normal.   ESOPHAGOGASTRODUODENOSCOPY (EGD) WITH PROPOFOL N/A 11/05/2021   Procedure: ESOPHAGOGASTRODUODENOSCOPY (EGD) WITH PROPOFOL;  Surgeon: [name], MD;  [facility];  Service: Endoscopy;  Laterality: N/A;   FLEXIBLE SIGMOIDOSCOPY  12/30/2010    [name],; internal hemorrhoids, anal papilla   HAND SURGERY     surgical intervention for injury of the fingers of the left hand many years ago   heart stent     HEMORRHOID BANDING     Dr. [name]   LEFT HEART CATH AND CORONARY ANGIOGRAPHY N/A 08/29/2019   Procedure: LEFT HEART CATH AND CORONARY ANGIOGRAPHY;  Surgeon: [name], MD;  Location: [place] [facility];  Service: Cardiovascular;  Laterality: N/A;   LEFT HEART CATH AND CORONARY ANGIOGRAPHY N/A 03/10/2021   Procedure: LEFT HEART CATH AND CORONARY ANGIOGRAPHY;  Surgeon: Belva Crome, MD;  Location: [place] [facility];  Service: Cardiovascular;  Laterality: N/A;   LEFT HEART CATHETERIZATION WITH CORONARY ANGIOGRAM N/A 01/26/2012   Procedure: LEFT HEART CATHETERIZATION WITH CORONARY ANGIOGRAM;  Surgeon: [name], MD;   Location: [facility] CATH LAB;  Service: Cardiovascular;  Laterality: N/A;   MALONEY DILATION N/A 09/15/2015   Procedure: [name] DILATION;  Surgeon: [name], MD;  [facility];  Service: Endoscopy;  Laterality: N/A;   MALONEY DILATION N/A 12/19/2017   Procedure: [name] DILATION;  Surgeon: [name], MD;  [facility];  Service: Endoscopy;  Laterality: N/A;   MALONEY DILATION N/A 09/17/2019   Procedure: [name]  DILATION;  Surgeon: [name], MD;  [facility];  Service: Endoscopy;  Laterality: N/A;   MALONEY DILATION N/A 11/05/2021   Procedure: [name] DILATION;  Surgeon: [name], MD;  [facility];  Service: Endoscopy;  Laterality: N/A;   NASAL SEPTOPLASTY W/ TURBINOPLASTY  10/06/2011   Procedure: NASAL SEPTOPLASTY WITH TURBINATE REDUCTION;  Surgeon: Izora Gala, MD;  Location: [place];  Service: ENT;  Laterality: Bilateral;   PERCUTANEOUS CORONARY STENT INTERVENTION (PCI-S) N/A 01/26/2012   Procedure: PERCUTANEOUS CORONARY STENT INTERVENTION (PCI-S);  Surgeon: [name], MD;  Location: [facility] C[facility];  Service: Cardiovascular;  Laterality: N/A;   POLYPECTOMY  09/15/2015   Procedure: POLYPECTOMY;  Surgeon: [name], MD;  [facility];  Service: Endoscopy;;  Cecal polyp removed via cold snare/ Descending colon polyp removed via cold snare   POLYPECTOMY  12/19/2017   Procedure: POLYPECTOMY;  Surgeon: [name], MD;  [facility];  Service: Endoscopy;;  colon   TEE WITHOUT CARDIOVERSION N/A 03/13/2021   Procedure: TRANSESOPHAGEAL ECHOCARDIOGRAM (TEE);  Surgeon: Lajuana Matte, MD;  Location: [place];  Service: Open Heart Surgery;  Laterality: N/A;    Past Family History   Family History  Problem Relation Age of Onset   Lung disease Father        deceased, black lung   Heart disease Mother        blood clots   Depression Mother    Cancer Paternal Uncle        unknown type   Cancer Maternal Aunt         unknown type   Kidney failure Maternal Uncle    Hypertension Brother    Colon cancer Neg Hx    ADD / ADHD Neg Hx    Alcohol abuse Neg Hx    Drug abuse Neg Hx    Anxiety disorder Neg Hx    Bipolar disorder Neg Hx    Dementia Neg Hx    OCD Neg Hx    Paranoid behavior Neg Hx    Schizophrenia Neg Hx    Physical abuse Neg Hx    Sexual abuse Neg Hx    Seizures Neg Hx     Past Social History   Social History   Socioeconomic History   Marital status: Married    Spouse name: Not on file   Number of children: 3   Years of education: Not on file   Highest education level: Not on file  Occupational History   Occupation: disable    Employer: RETIRED    Comment: [employer]  Tobacco Use   Smoking status: Former    Packs/day: 1.00    Years: 48.00    Total pack years: 48.00    Types: Cigarettes    Start date: 07/31/1969    Quit date: 03/16/2021    Years since quitting: 0.8   Smokeless tobacco: Former  Scientific laboratory technician Use: Never used  Substance and Sexual Activity   Alcohol use: Yes    Comment: Drinks a beer occasionally   Drug use: No   Sexual activity: Not Currently  Other Topics Concern   Not on file  Social History Narrative   3 stepchildren   Social Determinants of Health   Financial Resource Strain: Not on file  Food Insecurity: Not on file  Transportation Needs: Not on file  Physical Activity: Not on file  Stress: Not on file  Social Connections: Not on file  Intimate Partner Violence: Not on file    Review of Systems   General: Negative for anorexia, weight loss, fever, chills, fatigue, weakness. ENT: Negative for hoarseness, difficulty swallowing , nasal congestion. CV: Negative for chest pain, angina, palpitations, dyspnea on exertion, peripheral edema.  Respiratory: Negative for dyspnea at rest, dyspnea on exertion, cough, sputum, wheezing.  GI: See history of present illness. GU:  Negative for dysuria, hematuria, urinary incontinence, urinary frequency,  nocturnal urination.  Endo: Negative for unusual weight change.     Physical Exam   There were no vitals taken for this visit.   General: Well-nourished, well-developed in no acute distress.  Eyes: No icterus. Mouth: Oropharyngeal mucosa moist and pink , no lesions erythema or exudate. Lungs: Clear to auscultation bilaterally.  Heart: Regular rate and rhythm, no murmurs rubs or gallops.  Abdomen: Bowel sounds are normal, nontender, nondistended, no hepatosplenomegaly or masses,  no abdominal bruits or hernia , no rebound or guarding.  Rectal: ***  Extremities: No lower extremity edema. No clubbing or deformities. Neuro: Alert and oriented x 4   Skin: Warm and dry, no jaundice.   Psych: Alert and cooperative, normal mood and affect.  Labs   *** Imaging Studies   DG Finger Little Left  Result Date: 01/30/2022 CLINICAL DATA:  Laceration, rule out foreign body EXAM: LEFT LITTLE FINGER 2+V COMPARISON:  None Available. FINDINGS: Evidence of remote partial amputation of the left little finger at the level of the middle phalanx. No acute fracture, subluxation or dislocation. No radiopaque foreign bodies. 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 No acute bony abnormality. Electronically Signed   By: Rolm Baptise M.D.   On: 01/30/2022 19:18    Assessment       PLAN   ***   Laureen Ochs. Bobby Rumpf, [place], [facility]

## 2022-02-08 ENCOUNTER — Ambulatory Visit (INDEPENDENT_AMBULATORY_CARE_PROVIDER_SITE_OTHER): Payer: Medicare HMO | Admitting: Family Medicine

## 2022-02-08 ENCOUNTER — Ambulatory Visit: Payer: Medicare HMO | Admitting: Gastroenterology

## 2022-02-08 ENCOUNTER — Encounter: Payer: Self-pay | Admitting: Family Medicine

## 2022-02-08 VITALS — BP 122/70 | Wt 173.8 lb

## 2022-02-08 DIAGNOSIS — Z4802 Encounter for removal of sutures: Secondary | ICD-10-CM

## 2022-02-08 NOTE — Progress Notes (Signed)
   Subjective:    Patient ID: [name], male    DOB: Oct 10, 1955, 66 y.o.   MRN: [id number]  HPI Pt arrives to have sutures removed from left pinky. Pt cut pinky using box knife last week 01/30/22 while helping removing a gas line from a truck. Pt went to [facility] on 01/30/22. No pain but is sore.    Review of Systems     Objective:   Physical Exam Sutures removed without difficulty no sign of any infection seems to be healing well       Assessment & Plan:  Suture removed without difficulty warning signs regarding infection discussed Warning signs to watch for was discussed in detail no sign of infection currently

## 2022-02-12 ENCOUNTER — Telehealth (HOSPITAL_COMMUNITY): Payer: Medicare HMO | Admitting: Psychiatry

## 2022-02-17 ENCOUNTER — Telehealth (INDEPENDENT_AMBULATORY_CARE_PROVIDER_SITE_OTHER): Payer: Medicare HMO | Admitting: Psychiatry

## 2022-02-17 ENCOUNTER — Encounter (HOSPITAL_COMMUNITY): Payer: Self-pay | Admitting: Psychiatry

## 2022-02-17 DIAGNOSIS — F5105 Insomnia due to other mental disorder: Secondary | ICD-10-CM

## 2022-02-17 DIAGNOSIS — F418 Other specified anxiety disorders: Secondary | ICD-10-CM

## 2022-02-17 DIAGNOSIS — F4310 Post-traumatic stress disorder, unspecified: Secondary | ICD-10-CM | POA: Diagnosis not present

## 2022-02-17 MED ORDER — TRAZODONE HCL 50 MG PO TABS
ORAL_TABLET | ORAL | 1 refills | Status: DC
Start: 2022-02-17 — End: 2022-08-12

## 2022-02-17 MED ORDER — OLANZAPINE 10 MG PO TABS: 10.0000 mg | ORAL_TABLET | Freq: Every day | ORAL | 2 refills | Status: DC

## 2022-02-17 MED ORDER — GABAPENTIN 300 MG PO CAPS: 600.0000 mg | ORAL_CAPSULE | Freq: Three times a day (TID) | ORAL | 1 refills | Status: DC

## 2022-02-17 MED ORDER — FLUOXETINE HCL 40 MG PO CAPS: 40.0000 mg | ORAL_CAPSULE | Freq: Two times a day (BID) | ORAL | 2 refills | Status: DC

## 2022-02-17 MED ORDER — ALPRAZOLAM 1 MG PO TABS: 1.0000 mg | ORAL_TABLET | Freq: Three times a day (TID) | ORAL | 2 refills | Status: DC

## 2022-02-17 NOTE — Progress Notes (Signed)
Virtual Visit via Telephone Note  I connected with [name] on 02/17/22 at 10:00 AM EDT by telephone and verified that I am speaking with the correct person using two identifiers.  Location: Patient: home Provider: office   I discussed the limitations, risks, security and privacy concerns of performing an evaluation and management service by telephone and the availability of in person appointments. I also discussed with the patient that there may be a patient responsible charge related to this service. The patient expressed understanding and agreed to proceed.      I discussed the assessment and treatment plan with the patient. The patient was provided an opportunity to ask questions and all were answered. The patient agreed with the plan and demonstrated an understanding of the instructions.   The patient was advised to call back or seek an in-person evaluation if the symptoms worsen or if the condition fails to improve as anticipated.  I provided 15 minutes of non-face-to-face time during this encounter.   [name] Spiller, MD  [facility] MD/PA/NP OP Progress Note  02/17/2022 10:15 AM [name]  MRN:  [id number]  Chief Complaint:  Chief Complaint  Patient presents with   Depression   Anxiety   Follow-up   HPI: This patient is a 66 year old married white male lives with his wife and 2 grandchildren in [place].  He is on disability for coronary artery disease and COPD.  He used to work for [employer].  The patient returns for follow-up after his 6 weeks.  Last time he was more depressed after finding his 19 year old stepson dad recently.  He was having trouble sleeping and was more depressed and anxious.  We have gone up a little bit on the Xanax as well as Prozac and he is feeling somewhat better.  He still has some depression and anxiety but he is able to function a bit better.  He is spending a lot of time with his grandchildren.  He denies any thoughts of self-harm or suicide.  His  energy has picked up a little bit.  His appetite is still not great as he recently had all his teeth pulled and can only eat soft food. Visit Diagnosis:    ICD-10-CM   1. Depression with anxiety  F41.8     2. 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 s/p PTCA to LCx in 1993 b. BMS to RCA in 01/2012 with residual 80% OM2 stenosis and medical management recommended c. 08/2019: cath showing patent stents with occluded OM2 --> medical management. d. s/p CABG in 02/2021 with LIMA-LAD, reverse SVG-D1 and SVG-OM   Asthma    CAD (coronary artery disease)    Carcinoma in situ of colon 2004   rectal polyp   Colitis, ischemic ([place]) 2011   COPD (chronic obstructive pulmonary disease) (HCC)    mild ;excercise induced hypoxemia by cp stress test ;asthma ,bronchitis,   Depression    Diverticulosis    Gastritis 12/30/2010   EGD Dr [name]   GERD (gastroesophageal reflux disease)    GI bleed    H. pylori infection 2004   treated   Hemorrhoids    Hiatal hernia    Hyperlipidemia    Hypertension    Inflammatory  polyps of colon with rectal bleeding (HCC)    Leukocytosis    [name]   Lung nodule 07/16/2011   Myocardial infarction [facility])    age 35   Restless leg syndrome    Schatzki's ring    Sleep apnea    does not use CPAP:cannot tolerate, PCP aware   Syncope    Tobacco abuse    50 pack years continuing at one halp pack daily   Tubular adenoma of colon 06/2009   Colonosocpy Dr [name]    Past Surgical History:  Procedure Laterality Date   BIOPSY  12/19/2017   Procedure: BIOPSY;  Surgeon: [name], MD;  [facility];  Service: Endoscopy;;  gastric   BIOPSY  11/05/2021   Procedure: BIOPSY;  Surgeon: [name], MD;  [facility];   Service: Endoscopy;;   CARDIAC CATHETERIZATION     CHOLECYSTECTOMY  2004   COLONOSCOPY  06/2009   normal terminal ileum, segmental mild inflammation of sigmoid colon (bx unremarkable), polyp, tubular adenoma   COLONOSCOPY N/A 07/31/2013   Dr.Rourk- redundant anal canal hemorrhoids, colonic diverticulosis, tubular adenoma   COLONOSCOPY W/ POLYPECTOMY  2004   rectal polyp with carcinoma in situ removed via colonoscopy   COLONOSCOPY WITH PROPOFOL N/A 09/15/2015   Dr. [name]: Scattered diverticula throughout the colon, 2 sessile polyps found in the descending colon and cecum, 5 mm in size.  Cecal polyp was sessile serrated polyp, descending colon polyp was a tubular adenoma.  He had a abnormal perianal exam along with grade 3 hemorrhoids.  Surveillance exam recommended for 5-year follow-up.   COLONOSCOPY WITH PROPOFOL N/A 12/19/2017   one 5 mm polyp in ascending colon and 1 cm sessile polyp in ascending s/p removal. Tubular adenoma. internal hemorrhoids   COLONOSCOPY WITH PROPOFOL N/A 11/05/2021   Procedure: COLONOSCOPY WITH PROPOFOL;  Surgeon: [name], MD;  [facility];  Service: Endoscopy;  Laterality: N/A;  12:30pm   CORONARY ANGIOPLASTY WITH STENT PLACEMENT  01/26/2012   "1; total is now 2"   CORONARY ARTERY BYPASS GRAFT N/A 03/13/2021   Procedure: CORONARY ARTERY BYPASS GRAFTING (CABG), ON PUMP, TIMES THREE, USING LEFT INTERNAL MAMMARY ARTERY AND RIGHT ENDOSCOPICALLY HARVESTED GREATER SAPHENOUS VEIN;  Surgeon: Lajuana Matte, MD;  Location: [place];  Service: Open Heart Surgery;  Laterality: N/A;   CORONARY/GRAFT ACUTE MI REVASCULARIZATION N/A 03/10/2021   Procedure: Coronary/Graft Acute MI Revascularization;  Surgeon: Belva Crome, MD;  Location: [place] [facility];  Service: Cardiovascular;  Laterality: N/A;   ENDOVEIN HARVEST OF GREATER SAPHENOUS VEIN Right 03/13/2021   Procedure: ENDOVEIN HARVEST OF GREATER SAPHENOUS VEIN;  Surgeon: Lajuana Matte, MD;  Location: [place];   Service: Open Heart Surgery;  Laterality: Right;   ESOPHAGOGASTRODUODENOSCOPY  02/2009   query Barrett's but bx negative   ESOPHAGOGASTRODUODENOSCOPY  12/30/2010   [name],gastritis, dilated 48F, sm HH, 1 small ulcer, Duodenal erosions, benign bx   ESOPHAGOGASTRODUODENOSCOPY (EGD) WITH ESOPHAGEAL DILATION N/A 09/12/2012   YSA:[id number] Schatzki's ring s/p Maloney dilator. Small hiatal hernia. negative path   ESOPHAGOGASTRODUODENOSCOPY (EGD) WITH ESOPHAGEAL DILATION N/A 07/31/2013   Dr. [name]- normal egd, s/p [facility] dilation empirically. Normal small bowel biopsies    ESOPHAGOGASTRODUODENOSCOPY (EGD) WITH PROPOFOL N/A 09/15/2015   Dr. [name]: Medium sized hiatal hernia, normal-appearing esophagus status post empiric dilation   ESOPHAGOGASTRODUODENOSCOPY (EGD) WITH PROPOFOL N/A 12/19/2017   erosive esophagitis s/p dilation, erosive gastropathy, normal duodenum   ESOPHAGOGASTRODUODENOSCOPY (EGD) WITH PROPOFOL N/A  09/17/2019   Non-obstructing Schatzki's ring s/p dilation, medium-sized hiatal hernia, otherwise normal.   ESOPHAGOGASTRODUODENOSCOPY (EGD) WITH PROPOFOL N/A 11/05/2021   Procedure: ESOPHAGOGASTRODUODENOSCOPY (EGD) WITH PROPOFOL;  Surgeon: [name], MD;  [facility];  Service: Endoscopy;  Laterality: N/A;   FLEXIBLE SIGMOIDOSCOPY  12/30/2010    [name],; internal hemorrhoids, anal papilla   HAND SURGERY     surgical intervention for injury of the fingers of the left hand many years ago   heart stent     HEMORRHOID BANDING     Dr. [name]   LEFT HEART CATH AND CORONARY ANGIOGRAPHY N/A 08/29/2019   Procedure: LEFT HEART CATH AND CORONARY ANGIOGRAPHY;  Surgeon: [name], MD;  Location: [place] [facility];  Service: Cardiovascular;  Laterality: N/A;   LEFT HEART CATH AND CORONARY ANGIOGRAPHY N/A 03/10/2021   Procedure: LEFT HEART CATH AND CORONARY ANGIOGRAPHY;  Surgeon: Belva Crome, MD;  Location: [place] [facility];  Service: Cardiovascular;  Laterality:  N/A;   LEFT HEART CATHETERIZATION WITH CORONARY ANGIOGRAM N/A 01/26/2012   Procedure: LEFT HEART CATHETERIZATION WITH CORONARY ANGIOGRAM;  Surgeon: [name], MD;  Location: [facility] CATH LAB;  Service: Cardiovascular;  Laterality: N/A;   MALONEY DILATION N/A 09/15/2015   Procedure: [name] DILATION;  Surgeon: [name], MD;  [facility];  Service: Endoscopy;  Laterality: N/A;   MALONEY DILATION N/A 12/19/2017   Procedure: [name] DILATION;  Surgeon: [name], MD;  [facility];  Service: Endoscopy;  Laterality: N/A;   MALONEY DILATION N/A 09/17/2019   Procedure: [name] DILATION;  Surgeon: [name], MD;  [facility];  Service: Endoscopy;  Laterality: N/A;   MALONEY DILATION N/A 11/05/2021   Procedure: [name] DILATION;  Surgeon: [name], MD;  [facility];  Service: Endoscopy;  Laterality: N/A;   NASAL SEPTOPLASTY W/ TURBINOPLASTY  10/06/2011   Procedure: NASAL SEPTOPLASTY WITH TURBINATE REDUCTION;  Surgeon: Izora Gala, MD;  Location: [place];  Service: ENT;  Laterality: Bilateral;   PERCUTANEOUS CORONARY STENT INTERVENTION (PCI-S) N/A 01/26/2012   Procedure: PERCUTANEOUS CORONARY STENT INTERVENTION (PCI-S);  Surgeon: [name], MD;  Location: [facility] CATH LAB;  Service: Cardiovascular;  Laterality: N/A;   POLYPECTOMY  09/15/2015   Procedure: POLYPECTOMY;  Surgeon: [name], MD;  [facility];  Service: Endoscopy;;  Cecal polyp removed via cold snare/ Descending colon polyp removed via cold snare   POLYPECTOMY  12/19/2017   Procedure: POLYPECTOMY;  Surgeon: [name], MD;  [facility];  Service: Endoscopy;;  colon   TEE WITHOUT CARDIOVERSION N/A 03/13/2021   Procedure: TRANSESOPHAGEAL ECHOCARDIOGRAM (TEE);  Surgeon: Lajuana Matte, MD;  Location: [place];  Service: Open Heart Surgery;  Laterality: N/A;    Family Psychiatric History: See below  Family History:  Family History  Problem Relation  Age of Onset   Lung disease Father        deceased, black lung   Heart disease Mother        blood clots   Depression Mother    Cancer Paternal Uncle        unknown type   Cancer Maternal Aunt        unknown type   Kidney failure Maternal Uncle    Hypertension Brother    Colon cancer Neg Hx    ADD / ADHD Neg Hx    Alcohol abuse Neg Hx    Drug abuse Neg  Hx    Anxiety disorder Neg Hx    Bipolar disorder Neg Hx    Dementia Neg Hx    OCD Neg Hx    Paranoid behavior Neg Hx    Schizophrenia Neg Hx    Physical abuse Neg Hx    Sexual abuse Neg Hx    Seizures Neg Hx     Social History:  Social History   Socioeconomic History   Marital status: Married    Spouse name: Not on file   Number of children: 3   Years of education: Not on file   Highest education level: Not on file  Occupational History   Occupation: disable    Employer: RETIRED    Comment: [employer]  Tobacco Use   Smoking status: Former    Packs/day: 1.00    Years: 48.00    Total pack years: 48.00    Types: Cigarettes    Start date: 07/31/1969    Quit date: 03/16/2021    Years since quitting: 0.9   Smokeless tobacco: Former  Scientific laboratory technician Use: Never used  Substance and Sexual Activity   Alcohol use: Yes    Comment: Drinks a beer occasionally   Drug use: No   Sexual activity: Not Currently  Other Topics Concern   Not on file  Social History Narrative   3 stepchildren   Social Determinants of Health   Financial Resource Strain: Not on file  Food Insecurity: Not on file  Transportation Needs: Not on file  Physical Activity: Not on file  Stress: Not on file  Social Connections: Not on file    Allergies: No Known Allergies  Metabolic Disorder Labs: Lab Results  Component Value Date   HGBA1C 5.9 (H) 07/21/2021   MPG 94 03/11/2021   MPG 103 10/29/2013   No results found for: "PROLACTIN" Lab Results  Component Value Date   CHOL 158 07/21/2021   TRIG 99 07/21/2021   HDL 44 07/21/2021    CHOLHDL 3.6 07/21/2021   VLDL 16 03/11/2021   LDLCALC 96 07/21/2021   LDLCALC 27 03/11/2021   Lab Results  Component Value Date   TSH 1.154 03/11/2021   TSH 1.011 02/09/2010    Therapeutic Level Labs: No results found for: "LITHIUM" No results found for: "VALPROATE" No results found for: "CBMZ"  Current Medications: Current Outpatient Medications  Medication Sig Dispense Refill   albuterol (VENTOLIN HFA) 108 (90 Base) MCG/ACT inhaler Inhale 2 puffs into the lungs every 4 (four) hours as needed for wheezing or shortness of breath. 18 g 1   ALPRAZolam (XANAX) 1 MG tablet Take 1 tablet (1 mg total) by mouth 3 (three) times daily. 90 tablet 2   amLODipine (NORVASC) 5 MG tablet 1 take tablet ('5mg'$ ) by mouth Once a day. 90 tablet 1   aspirin EC 81 MG tablet Take 81 mg by mouth daily.     atorvastatin (LIPITOR) 80 MG tablet TAKE 1 TABLET BY MOUTH EVERY DAY 90 tablet 3   clopidogrel (PLAVIX) 75 MG tablet Take 1 tablet (75 mg total) by mouth daily. 30 tablet 11   Cyanocobalamin (VITAMIN B-12 PO) Take 1 tablet by mouth in the morning.     FLUoxetine (PROZAC) 40 MG capsule Take 1 capsule (40 mg total) by mouth 2 (two) times daily. 180 capsule 2   Fluticasone-Umeclidin-Vilant (TRELEGY ELLIPTA) 100-62.5-25 MCG/ACT AEPB Inhale 1 puff into the lungs daily. 180 each 3   gabapentin (NEURONTIN) 300 MG capsule Take 2 capsules (600 mg  total) by mouth 3 (three) times daily. 540 capsule 1   HYDROcodone-acetaminophen (NORCO/VICODIN) 5-325 MG tablet Take 1 tablet by mouth 2 (two) times daily as needed for moderate pain. 45 tablet 0   HYDROcodone-acetaminophen (NORCO/VICODIN) 5-325 MG tablet Take 1 tablet by mouth 2 (two) times daily as needed for moderate pain. 45 tablet 0   HYDROcodone-acetaminophen (NORCO/VICODIN) 5-325 MG tablet Take 1 tablet by mouth 2 (two) times daily as needed for moderate pain. 45 tablet 0   isosorbide mononitrate (IMDUR) 30 MG 24 hr tablet Take 30 mg by mouth daily.      lipase/protease/amylase (CREON) 36000 UNITS CPEP capsule Take 2 capsules (72,000 Units total) by mouth 3 (three) times daily with meals. May also take 1 capsule (36,000 Units total) as needed (with snacks). (Patient taking differently: Take 2 capsules (72,000 Units total) by mouth 3 (three) times daily with meals.) 240 capsule 3   meloxicam (MOBIC) 15 MG tablet TAKE 1 TABLET EVERY DAY 90 tablet 1   metoprolol succinate (TOPROL-XL) 50 MG 24 hr tablet TAKE 1 TABLET BY MOUTH DAILY 90 tablet 1   nitroGLYCERIN (NITROSTAT) 0.4 MG SL tablet Place 1 tablet (0.4 mg total) under the tongue every 5 (five) minutes as needed for chest pain. 20 tablet 5   OLANZapine (ZYPREXA) 10 MG tablet Take 1 tablet (10 mg total) by mouth at bedtime. 90 tablet 2   pantoprazole (PROTONIX) 40 MG tablet TAKE 1 TABLET BY MOUTH TWICE A DAY BEFORE A MEAL 180 tablet 3   polyethylene glycol-electrolytes (NULYTELY) 420 g solution As directed 4000 mL 0   rOPINIRole (REQUIP) 2 MG tablet TAKE 1 TABLET AT BEDTIME 90 tablet 1   tamsulosin (FLOMAX) 0.4 MG CAPS capsule TAKE 1 CAPSULE EVERY DAY 90 capsule 1   traZODone (DESYREL) 50 MG tablet TAKE 1 TABLET AT BEDTIME 90 tablet 1   valsartan (DIOVAN) 40 MG tablet TAKE 1 TABLET BY MOUTH EVERY DAY 90 tablet 1   valsartan (DIOVAN) 80 MG tablet TAKE 1 TABLET EVERY DAY. DOSE CHANGE 90 tablet 1   No current facility-administered medications for this visit.     Musculoskeletal: Strength & Muscle Tone: na Gait & Station: na Patient leans: N/A  Psychiatric Specialty Exam: Review of Systems  Cardiovascular:  Positive for chest pain.  Musculoskeletal:  Positive for back pain.  Psychiatric/Behavioral:  The patient is nervous/anxious.   All other systems reviewed and are negative.   There were no vitals taken for this visit.There is no height or weight on file to calculate BMI.  General Appearance: NA  Eye Contact:  NA  Speech:  Clear and Coherent  Volume:  Normal  Mood:  Anxious  Affect:   NA  Thought Process:  Goal Directed  Orientation:  Full (Time, Place, and Person)  Thought Content: Rumination   Suicidal Thoughts:  No  Homicidal Thoughts:  No  Memory:  Immediate;   Good Recent;   Good Remote;   Good  Judgement:  Good  Insight:  Fair  Psychomotor Activity:  Decreased  Concentration:  Concentration: Good and Attention Span: Good  Recall:  Good  Fund of Knowledge: Good  Language: Good  Akathisia:  No  Handed:  Right  AIMS (if indicated): not done  Assets:  Communication Skills Desire for Improvement Resilience Social Support  ADL's:  Intact  Cognition: WNL  Sleep:  Fair   Screenings: GAD-7    [place] Office Visit from 01/05/2021 in [place] Office Visit from 01/16/2020 in [place]  Family Medicine Office Visit from 03/30/2018 in [place]  Total GAD-7 Score '13 13 4      '$ PHQ2-9    Flowsheet Row Video Visit from 02/17/2022 in [place] Office Visit from 01/20/2022 in [place] Video Visit from 01/01/2022 in [place] Video Visit from 07/01/2021 in [place] Office Visit from 01/05/2021 in [place]  PHQ-2 Total Score 2 0 '6 1 3  '$ PHQ-9 Total Score 6 -- 14 -- 11      Flowsheet Row Video Visit from 02/17/2022 in [place] ED from 01/30/2022 in [place] Video Visit from 01/01/2022 in [place] No Risk No Risk No Risk        Assessment and Plan: This patient is a 66 year old male with a history of depression anxiety and PTSD regarding both of his children's deaths.  He is doing somewhat better since we increased his medications.  He will continue Prozac 80 mg daily for depression, Xanax 1 mg 3 times daily for anxiety, gabapentin 600  mg twice daily for anxiety and olanzapine 10 mg at bedtime for mood stabilization.  He will return to see me in 3 months  Collaboration of Care: Collaboration of Care: Primary Care Provider AEB notes are shared with PCP on the epic system  Patient/Guardian was advised Release of Information must be obtained prior to any record release in order to collaborate their care with an outside provider. Patient/Guardian was advised if they have not already done so to contact the registration department to sign all necessary forms in order for [place] to release information regarding their care.   Consent: Patient/Guardian gives verbal consent for treatment and assignment of benefits for services provided during this visit. Patient/Guardian expressed understanding and agreed to proceed.    [name] Spiller, MD 02/17/2022, 10:15 AM

## 2022-03-05 ENCOUNTER — Encounter: Payer: Self-pay | Admitting: Cardiology

## 2022-03-05 ENCOUNTER — Ambulatory Visit: Payer: Medicare HMO | Attending: Cardiology | Admitting: Cardiology

## 2022-03-05 VITALS — BP 100/68 | HR 64 | Ht 71.0 in | Wt 179.2 lb

## 2022-03-05 DIAGNOSIS — Z136 Encounter for screening for cardiovascular disorders: Secondary | ICD-10-CM

## 2022-03-05 DIAGNOSIS — E785 Hyperlipidemia, unspecified: Secondary | ICD-10-CM

## 2022-03-05 DIAGNOSIS — I25118 Atherosclerotic heart disease of native coronary artery with other forms of angina pectoris: Secondary | ICD-10-CM | POA: Diagnosis not present

## 2022-03-05 DIAGNOSIS — I10 Essential (primary) hypertension: Secondary | ICD-10-CM

## 2022-03-05 NOTE — Patient Instructions (Addendum)
Medication Instructions:  Stop Plavix (Clopidogrel) Stop Valsartan (Diovan) Continue all other medications.     Labwork: FLP - order given today Reminder:  Nothing to eat or drink after 12 midnight prior to labs.  Testing/Procedures: Your physician has requested that you have an abdominal aorta duplex. During this test, an ultrasound is used to evaluate the aorta. Allow 30 minutes for this exam. Do not eat after midnight the day before and avoid carbonated beverages   Follow-Up: Office will contact with results via phone, letter or mychart.    6 months   Any Other Special Instructions Will Be Listed Below (If Applicable).   If you need a refill on your cardiac medications before your next appointment, please call your pharmacy.

## 2022-03-05 NOTE — Progress Notes (Signed)
Clinical Summary [name] is a 66 y.o.male seen today for follow up of the following medical problems.    1. CAD - hx of infeior MI with prior PTCA to LCX in 1993 -  cath 2011 LM patent, LAD prox 40%, LCX prox 40%, OM1 30%, OM2 80% small too small for intervention, RCA very small non dom, 90%. LVEF 50-55% by LV gram.   08/2011 echo LVEF [id number], grade I diastolic dysfunction.   03/2016 echo: LVEF [id number], normal diastolic function      [id number] nuclear stress: inferior /inferolaterla infarct without ischemia. LVEF 45%   - no recent chest pain. Mild SOB at rest and with activity. Some cough. Compliant with inhalers -compliant with meds    02/2021 CABG LIMA-LAD, reverse SVG-D1 and SVG-OM, presented initially with ACS 02/2021 echo: LVEF 45-50%, mild to mod MR  - occasional chest pains. Sharp pain midchest, can be worst with position at sternal site. No exertional symptoms - compliant with meds     2. Hyperlipidemia 07/2021 TC 158 TG 99 HDL 44 LDL 96 - compliant with meds   3. HTN - he is compliant with meds   4. COPD - followed by pcp     5. OSA - does not use CPAP machine due to discomfort     6. GERD/Dysphagia - followed by GI    7. Carotid stenosis -02/2021 carotid US bilateral 1-39% disease      SH: enjoys working in yard, fishing.  Daughter fairly recently committed suicide Past Medical History:  Diagnosis Date   Anxiety    Anxiety and depression    ASCVD (arteriosclerotic cardiovascular disease)    a. s/p PTCA to LCx in 1993 b. BMS to RCA in 01/2012 with residual 80% OM2 stenosis and medical management recommended c. 08/2019: cath showing patent stents with occluded OM2 --> medical management. d. s/p CABG in 02/2021 with LIMA-LAD, reverse SVG-D1 and SVG-OM   Asthma    CAD (coronary artery disease)    Carcinoma in situ of colon 2004   rectal polyp   Colitis, ischemic ([place]) 2011   COPD (chronic obstructive pulmonary disease) (HCC)    mild ;excercise induced  hypoxemia by cp stress test ;asthma ,bronchitis,   Depression    Diverticulosis    Gastritis 12/30/2010   EGD Dr [name]   GERD (gastroesophageal reflux disease)    GI bleed    H. 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)    Leukocytosis    [name]   Lung nodule 07/16/2011   Myocardial infarction [facility])    age 59   Restless leg syndrome    Schatzki's ring    Sleep apnea    does not use CPAP:cannot tolerate, PCP aware   Syncope    Tobacco abuse    50 pack years continuing at one halp pack daily   Tubular adenoma of colon 06/2009   Colonosocpy Dr [name]     No Known Allergies   Current Outpatient Medications  Medication Sig Dispense Refill   albuterol (VENTOLIN HFA) 108 (90 Base) MCG/ACT inhaler Inhale 2 puffs into the lungs every 4 (four) hours as needed for wheezing or shortness of breath. 18 g 1   ALPRAZolam (XANAX) 1 MG tablet Take 1 tablet (1 mg total) by mouth 3 (three) times daily. 90 tablet 2  amLODipine (NORVASC) 5 MG tablet 1 take tablet ('5mg'$ ) by mouth Once a day. 90 tablet 1   aspirin EC 81 MG tablet Take 81 mg by mouth daily.     atorvastatin (LIPITOR) 80 MG tablet TAKE 1 TABLET BY MOUTH EVERY DAY 90 tablet 3   clopidogrel (PLAVIX) 75 MG tablet Take 1 tablet (75 mg total) by mouth daily. 30 tablet 11   Cyanocobalamin (VITAMIN B-12 PO) Take 1 tablet by mouth in the morning.     FLUoxetine (PROZAC) 40 MG capsule Take 1 capsule (40 mg total) by mouth 2 (two) times daily. 180 capsule 2   Fluticasone-Umeclidin-Vilant (TRELEGY ELLIPTA) 100-62.5-25 MCG/ACT AEPB Inhale 1 puff into the lungs daily. 180 each 3   gabapentin (NEURONTIN) 300 MG capsule Take 2 capsules (600 mg total) by mouth 3 (three) times daily. 540 capsule 1   HYDROcodone-acetaminophen (NORCO/VICODIN) 5-325 MG tablet Take 1 tablet by mouth 2 (two) times daily as needed for moderate  pain. 45 tablet 0   HYDROcodone-acetaminophen (NORCO/VICODIN) 5-325 MG tablet Take 1 tablet by mouth 2 (two) times daily as needed for moderate pain. 45 tablet 0   HYDROcodone-acetaminophen (NORCO/VICODIN) 5-325 MG tablet Take 1 tablet by mouth 2 (two) times daily as needed for moderate pain. 45 tablet 0   isosorbide mononitrate (IMDUR) 30 MG 24 hr tablet Take 30 mg by mouth daily.     lipase/protease/amylase (CREON) 36000 UNITS CPEP capsule Take 2 capsules (72,000 Units total) by mouth 3 (three) times daily with meals. May also take 1 capsule (36,000 Units total) as needed (with snacks). (Patient taking differently: Take 2 capsules (72,000 Units total) by mouth 3 (three) times daily with meals.) 240 capsule 3   meloxicam (MOBIC) 15 MG tablet TAKE 1 TABLET EVERY DAY 90 tablet 1   metoprolol succinate (TOPROL-XL) 50 MG 24 hr tablet TAKE 1 TABLET BY MOUTH DAILY 90 tablet 1   nitroGLYCERIN (NITROSTAT) 0.4 MG SL tablet Place 1 tablet (0.4 mg total) under the tongue every 5 (five) minutes as needed for chest pain. 20 tablet 5   OLANZapine (ZYPREXA) 10 MG tablet Take 1 tablet (10 mg total) by mouth at bedtime. 90 tablet 2   pantoprazole (PROTONIX) 40 MG tablet TAKE 1 TABLET BY MOUTH TWICE A DAY BEFORE A MEAL 180 tablet 3   polyethylene glycol-electrolytes (NULYTELY) 420 g solution As directed 4000 mL 0   rOPINIRole (REQUIP) 2 MG tablet TAKE 1 TABLET AT BEDTIME 90 tablet 1   tamsulosin (FLOMAX) 0.4 MG CAPS capsule TAKE 1 CAPSULE EVERY DAY 90 capsule 1   traZODone (DESYREL) 50 MG tablet TAKE 1 TABLET AT BEDTIME 90 tablet 1   valsartan (DIOVAN) 40 MG tablet TAKE 1 TABLET BY MOUTH EVERY DAY 90 tablet 1   valsartan (DIOVAN) 80 MG tablet TAKE 1 TABLET EVERY DAY. DOSE CHANGE 90 tablet 1   No current facility-administered medications for this visit.     Past Surgical History:  Procedure Laterality Date   BIOPSY  12/19/2017   Procedure: BIOPSY;  Surgeon: [name], MD;  [facility];   Service: Endoscopy;;  gastric   BIOPSY  11/05/2021   Procedure: BIOPSY;  Surgeon: [name], MD;  [facility];  Service: Endoscopy;;   CARDIAC CATHETERIZATION     CHOLECYSTECTOMY  2004   COLONOSCOPY  06/2009   normal terminal ileum, segmental mild inflammation of sigmoid colon (bx unremarkable), polyp, tubular adenoma   COLONOSCOPY N/A 07/31/2013   Dr.Rourk- redundant anal canal  hemorrhoids, colonic diverticulosis, tubular adenoma   COLONOSCOPY W/ POLYPECTOMY  2004   rectal polyp with carcinoma in situ removed via colonoscopy   COLONOSCOPY WITH PROPOFOL N/A 09/15/2015   Dr. [name]: Scattered diverticula throughout the colon, 2 sessile polyps found in the descending colon and cecum, 5 mm in size.  Cecal polyp was sessile serrated polyp, descending colon polyp was a tubular adenoma.  He had a abnormal perianal exam along with grade 3 hemorrhoids.  Surveillance exam recommended for 5-year follow-up.   COLONOSCOPY WITH PROPOFOL N/A 12/19/2017   one 5 mm polyp in ascending colon and 1 cm sessile polyp in ascending s/p removal. Tubular adenoma. internal hemorrhoids   COLONOSCOPY WITH PROPOFOL N/A 11/05/2021   Procedure: COLONOSCOPY WITH PROPOFOL;  Surgeon: [name], MD;  [facility];  Service: Endoscopy;  Laterality: N/A;  12:30pm   CORONARY ANGIOPLASTY WITH STENT PLACEMENT  01/26/2012   "1; total is now 2"   CORONARY ARTERY BYPASS GRAFT N/A 03/13/2021   Procedure: CORONARY ARTERY BYPASS GRAFTING (CABG), ON PUMP, TIMES THREE, USING LEFT INTERNAL MAMMARY ARTERY AND RIGHT ENDOSCOPICALLY HARVESTED GREATER SAPHENOUS VEIN;  Surgeon: Lajuana Matte, MD;  Location: [place];  Service: Open Heart Surgery;  Laterality: N/A;   CORONARY/GRAFT ACUTE MI REVASCULARIZATION N/A 03/10/2021   Procedure: Coronary/Graft Acute MI Revascularization;  Surgeon: Belva Crome, MD;  Location: [place] [facility];  Service: Cardiovascular;  Laterality: N/A;   ENDOVEIN HARVEST OF GREATER SAPHENOUS  VEIN Right 03/13/2021   Procedure: ENDOVEIN HARVEST OF GREATER SAPHENOUS VEIN;  Surgeon: Lajuana Matte, MD;  Location: [place];  Service: Open Heart Surgery;  Laterality: Right;   ESOPHAGOGASTRODUODENOSCOPY  02/2009   query Barrett's but bx negative   ESOPHAGOGASTRODUODENOSCOPY  12/30/2010   [name],gastritis, dilated 21F, sm HH, 1 small ulcer, Duodenal erosions, benign bx   ESOPHAGOGASTRODUODENOSCOPY (EGD) WITH ESOPHAGEAL DILATION N/A 09/12/2012   [id number] Schatzki's ring s/p Maloney dilator. Small hiatal hernia. negative path   ESOPHAGOGASTRODUODENOSCOPY (EGD) WITH ESOPHAGEAL DILATION N/A 07/31/2013   Dr. [name]- normal egd, s/p [facility] dilation empirically. Normal small bowel biopsies    ESOPHAGOGASTRODUODENOSCOPY (EGD) WITH PROPOFOL N/A 09/15/2015   Dr. [name]: Medium sized hiatal hernia, normal-appearing esophagus status post empiric dilation   ESOPHAGOGASTRODUODENOSCOPY (EGD) WITH PROPOFOL N/A 12/19/2017   erosive esophagitis s/p dilation, erosive gastropathy, normal duodenum   ESOPHAGOGASTRODUODENOSCOPY (EGD) WITH PROPOFOL N/A 09/17/2019   Non-obstructing Schatzki's ring s/p dilation, medium-sized hiatal hernia, otherwise normal.   ESOPHAGOGASTRODUODENOSCOPY (EGD) WITH PROPOFOL N/A 11/05/2021   Procedure: ESOPHAGOGASTRODUODENOSCOPY (EGD) WITH PROPOFOL;  Surgeon: [name], MD;  [facility];  Service: Endoscopy;  Laterality: N/A;   FLEXIBLE SIGMOIDOSCOPY  12/30/2010    [name],; internal hemorrhoids, anal papilla   HAND SURGERY     surgical intervention for injury of the fingers of the left hand many years ago   heart stent     HEMORRHOID BANDING     Dr. [name]   LEFT HEART CATH AND CORONARY ANGIOGRAPHY N/A 08/29/2019   Procedure: LEFT HEART CATH AND CORONARY ANGIOGRAPHY;  Surgeon: [name], MD;  Location: [place] [facility];  Service: Cardiovascular;  Laterality: N/A;   LEFT HEART CATH AND CORONARY ANGIOGRAPHY N/A 03/10/2021   Procedure: LEFT HEART  CATH AND CORONARY ANGIOGRAPHY;  Surgeon: Belva Crome, MD;  Location: [place] [facility];  Service: Cardiovascular;  Laterality: N/A;   LEFT HEART CATHETERIZATION WITH CORONARY ANGIOGRAM N/A 01/26/2012   Procedure: LEFT HEART CATHETERIZATION  WITH CORONARY ANGIOGRAM;  Surgeon: [name], MD;  Location: [facility] CATH LAB;  Service: Cardiovascular;  Laterality: N/A;   MALONEY DILATION N/A 09/15/2015   Procedure: [name] DILATION;  Surgeon: [name], MD;  [facility];  Service: Endoscopy;  Laterality: N/A;   MALONEY DILATION N/A 12/19/2017   Procedure: [name] DILATION;  Surgeon: [name], MD;  [facility];  Service: Endoscopy;  Laterality: N/A;   MALONEY DILATION N/A 09/17/2019   Procedure: [name] DILATION;  Surgeon: [name], MD;  [facility];  Service: Endoscopy;  Laterality: N/A;   MALONEY DILATION N/A 11/05/2021   Procedure: [name] DILATION;  Surgeon: [name], MD;  [facility];  Service: Endoscopy;  Laterality: N/A;   NASAL SEPTOPLASTY W/ TURBINOPLASTY  10/06/2011   Procedure: NASAL SEPTOPLASTY WITH TURBINATE REDUCTION;  Surgeon: Izora Gala, MD;  Location: [place];  Service: ENT;  Laterality: Bilateral;   PERCUTANEOUS CORONARY STENT INTERVENTION (PCI-S) N/A 01/26/2012   Procedure: PERCUTANEOUS CORONARY STENT INTERVENTION (PCI-S);  Surgeon: [name], MD;  Location: [facility] CATH LAB;  Service: Cardiovascular;  Laterality: N/A;   POLYPECTOMY  09/15/2015   Procedure: POLYPECTOMY;  Surgeon: [name], MD;  [facility];  Service: Endoscopy;;  Cecal polyp removed via cold snare/ Descending colon polyp removed via cold snare   POLYPECTOMY  12/19/2017   Procedure: POLYPECTOMY;  Surgeon: [name], MD;  [facility];  Service: Endoscopy;;  colon   TEE WITHOUT CARDIOVERSION N/A 03/13/2021   Procedure: TRANSESOPHAGEAL ECHOCARDIOGRAM (TEE);  Surgeon: Lajuana Matte, MD;  Location: [place];  Service: Open  Heart Surgery;  Laterality: N/A;     No Known Allergies    Family History  Problem Relation Age of Onset   Lung disease Father        deceased, black lung   Heart disease Mother        blood clots   Depression Mother    Cancer Paternal Uncle        unknown type   Cancer Maternal Aunt        unknown type   Kidney failure Maternal Uncle    Hypertension Brother    Colon cancer Neg Hx    ADD / ADHD Neg Hx    Alcohol abuse Neg Hx    Drug abuse Neg Hx    Anxiety disorder Neg Hx    Bipolar disorder Neg Hx    Dementia Neg Hx    OCD Neg Hx    Paranoid behavior Neg Hx    Schizophrenia Neg Hx    Physical abuse Neg Hx    Sexual abuse Neg Hx    Seizures Neg Hx      Social History Mr. Bois reports that he quit smoking about a year ago. His smoking use included cigarettes. He started smoking about 52 years ago. He has a 48.00 pack-year smoking history. He has quit using smokeless tobacco. Mr. Ottum reports current alcohol use.   Review of Systems CONSTITUTIONAL: No weight loss, fever, chills, weakness or fatigue.  HEENT: Eyes: No visual loss, blurred vision, double vision or yellow sclerae.No hearing loss, sneezing, congestion, runny nose or sore throat.  SKIN: No rash or itching.  CARDIOVASCULAR: per hpi RESPIRATORY: No shortness of breath, cough or sputum.  GASTROINTESTINAL: No anorexia, nausea, vomiting or diarrhea. No abdominal pain or blood.  GENITOURINARY: No burning on urination, no polyuria NEUROLOGICAL: No headache, dizziness, syncope, paralysis, ataxia, numbness  or tingling in the extremities. No change in bowel or bladder control.  MUSCULOSKELETAL: No muscle, back pain, joint pain or stiffness.  LYMPHATICS: No enlarged nodes. No history of splenectomy.  PSYCHIATRIC: No history of depression or anxiety.  ENDOCRINOLOGIC: No reports of sweating, cold or heat intolerance. No polyuria or polydipsia.  Marland Kitchen   Physical Examination Today's Vitals   03/05/22 1539  BP:  100/68  Pulse: 64  SpO2: 94%  Weight: 179 lb 3.2 oz (81.3 kg)  Height: '5\' 11"'$  (1.803 m)   Body mass index is 24.99 kg/m.  Gen: resting comfortably, no acute distress HEENT: no scleral icterus, pupils equal round and reactive, no palptable cervical adenopathy,  CV: RRR, no m/r/g no jvd Resp: Clear to auscultation bilaterally GI: abdomen is soft, non-tender, non-distended, normal bowel sounds, no hepatosplenomegaly MSK: extremities are warm, no edema.  Skin: warm, no rash Neuro:  no focal deficits Psych: appropriate affect   Diagnostic Studies 02/2010 Cath DESCRIPTION OF PROCEDURE:  The risks and indication were explained. Consent was signed and placed on the chart.  A 4-French arterial sheath was placed in the right femoral artery using a modified Seldinger technique.  A standard catheters including a JL-4, 3DRC, and angled pigtail were used.  All catheter exchanges were made over wire.  There were no apparent complications.  Central aortic pressure 109/67 with a mean of 86.  LV pressure 110/70 with an EDP of 28.  There was no aortic stenosis.   Left main was normal.   LAD was a long vessel coursing to the apex, it gave off a moderate-sized diagonal Aadhya Bustamante.  There was a 40% lesion in the proximal LAD, otherwise minimal luminal irregularities.   Left circumflex was a large dominant vessel, gave off a narrow caliber ramus, large OM-1, and narrow caliber OM-2 in several posterolaterals. In the proximal left circumflex, he had a 40% lesion which extended into the proximal portion of the OM-1.  There was a 30% lesion in the mid OM- 1.  In the OM-2, there was a long 80% stenosis throughout the ostial and proximal portion.  This vessel was very small caliber vessel.   Right coronary artery was a nondominant vessel, gave off an RV Nayleen Janosik. There was approximately 90% stenosis in the proximal portion.  Once again, this was a very small nondominant vessel.   Left ventriculogram  done in the RAO position showed an EF of 50-55% with inferior basilar akinesis.  There was no significant mitral regurgitation.   Abdominal aortogram showed 40% ostial lesion in the right renal artery. The left renal artery was widely patent.  There was no abdominal aortic aneurysm.  On panning down over the iliac and femoral system, there was no high-grade lesions.   ASSESSMENT: 1. Stable coronary artery disease with high-grade lesions in the small     obtuse marginal 2 and right coronary artery both of which are too     small for percutaneous intervention. 2. Left ventricular ejection fraction was 50-55% with mildly elevated     filling pressures. 3. Very mild right renal artery stenosis. 4. No obvious high-grade proximal peripheral arterial disease.   PLAN/DISCUSSION:  I suspect most of his dyspnea is likely due to his lung disease.  I have urged him to stop smoking.  We will continue medical therapy now for his heart disease.     08/2011 Echo Study Conclusions  - Left ventricle: The cavity size was normal. There was mild focal basal hypertrophy of the septum.  The estimated ejection fraction was 55%. There is akinesis of the basalinferior myocardium. Doppler parameters are consistent with abnormal left ventricular relaxation (grade 1 diastolic dysfunction). - Mitral valve: Trivial regurgitation. - Left atrium: The atrium was at the upper limits of normal in size. - Tricuspid valve: Trivial regurgitation. - Pulmonary arteries: PA peak pressure: 73m Hg (S). - Pericardium, extracardiac: There was no pericardial effusion.   09/2015 ABIs: 1.2 bilaterally     03/2016 echo Study Conclusions   - Left ventricle: The cavity size was normal. Wall thickness was   normal. The estimated ejection fraction was 50%. There is   akinesis of the basal-midinferolateral and inferior myocardium.   Left ventricular diastolic function parameters were normal. - Aortic valve: Mildly  calcified annulus. Trileaflet. - Mitral valve: Mildly thickened leaflets . There was trivial   regurgitation. - Left atrium: The atrium was at the upper limits of normal in   size. - Right atrium: Central venous pressure (est): 3 mm Hg. - Atrial septum: No defect or patent foramen ovale was identified. - Tricuspid valve: There was trivial regurgitation. - Pulmonary arteries: PA peak pressure: 9 mm Hg (S). - Pericardium, extracardiac: There was no pericardial effusion.   Impressions:   - Normal LV wall thickness with LVEF approximately 50%. There is   akinesis of the mid to basal inferolateral/inferior wall. Normal   diastolic function. Upper normal left atrial chamber size. Mildly   thickened mitral leaflets with trivial mitral regurgitation.   Mildly sclerotic aortic valve. Trivial tricuspid regurgitation.   08/2017 nuclear stress There was no ST segment deviation noted during stress. Findings consistent with large prior inferior/inferolateral myocardial infarction. This is an intermediate risk study. Risk based on decreased LVEF, there is no current myocardium at jeopardy. The left ventricular ejection fraction is mildly decreased (45%).      02/2021 cath CONCLUSIONS: Acute coronary syndrome with ostial 99% of circumflex progression.  First marginal 70%. 40 to 50% distal left main 40 to 50% mid LAD with 40% first diagonal Right coronary with diffuse 30 to 40% in-stent restenosis proximal and eccentric 50 to 60% mid to distal stenosis. LV systolic dysfunction with regional wall motion abnormality, and elevated LVEDP of 22 mmHg.  Findings are consistent with acute on chronic combined systolic and diastolic heart failure.   RECOMMENDATIONS:   IV Aggrastat IV heparin IV nitro Notify Dr. LKipp Broodof patient's presence and requested consideration of surgical revascularization.  Could also discuss other treatment options which could include circumflex stent with extension into the  left main however it would gel the LAD.  Left main stenting would not be an optimal long-term strategy for this patient given his age of 89 Was not loaded with P2 Y 12 agent.   Assessment and Plan  1. CAD - no recent cardiac symptoms - 1 year since his CABG for ACS, can d/c plavix - continue other meds   2. Hyperlipidemia -repeat lipid panel, goal LDL would be <55   3. HTN -at goal, continue current meds      JArnoldo Lenis M.D.

## 2022-03-16 ENCOUNTER — Ambulatory Visit: Payer: Medicare HMO | Attending: Cardiology

## 2022-03-16 DIAGNOSIS — F17210 Nicotine dependence, cigarettes, uncomplicated: Secondary | ICD-10-CM

## 2022-03-16 DIAGNOSIS — Z136 Encounter for screening for cardiovascular disorders: Secondary | ICD-10-CM

## 2022-03-18 ENCOUNTER — Telehealth: Payer: Self-pay | Admitting: *Deleted

## 2022-03-18 NOTE — Telephone Encounter (Signed)
-----   Message from Arnoldo Lenis, MD sent at 03/18/2022  9:44 AM EDT ----- Normal [place], no evidence of aneurysm   Zandra Abts MD

## 2022-03-18 NOTE — Telephone Encounter (Signed)
Laurine Blazer, LPN  18/08/4371  5:78 PM EDT Back to Top    Notified, copy to pcp.

## 2022-04-08 ENCOUNTER — Other Ambulatory Visit: Payer: Self-pay | Admitting: Student

## 2022-04-08 ENCOUNTER — Other Ambulatory Visit: Payer: Self-pay | Admitting: Gastroenterology

## 2022-04-08 ENCOUNTER — Other Ambulatory Visit: Payer: Self-pay | Admitting: Cardiology

## 2022-04-14 ENCOUNTER — Telehealth: Payer: Self-pay

## 2022-04-14 NOTE — Telephone Encounter (Signed)
error 

## 2022-04-22 ENCOUNTER — Ambulatory Visit: Payer: Medicare HMO | Admitting: Family Medicine

## 2022-05-04 ENCOUNTER — Other Ambulatory Visit: Payer: Self-pay | Admitting: Family Medicine

## 2022-05-04 ENCOUNTER — Telehealth: Payer: Self-pay | Admitting: Family Medicine

## 2022-05-04 NOTE — Telephone Encounter (Signed)
Front I sent in a refill of his pain medicine as requested he has not been seen by myself for pain management within the past 3 months therefore please set up the appointment with me within the next 30 days thank you

## 2022-05-10 ENCOUNTER — Encounter: Payer: Medicare HMO | Admitting: Nurse Practitioner

## 2022-05-10 ENCOUNTER — Ambulatory Visit (INDEPENDENT_AMBULATORY_CARE_PROVIDER_SITE_OTHER): Payer: Medicare HMO | Admitting: Nurse Practitioner

## 2022-05-10 ENCOUNTER — Encounter: Payer: Self-pay | Admitting: Nurse Practitioner

## 2022-05-10 VITALS — BP 139/82 | HR 61 | Temp 97.7°F | Ht 71.0 in | Wt 181.0 lb

## 2022-05-10 DIAGNOSIS — R6889 Other general symptoms and signs: Secondary | ICD-10-CM | POA: Diagnosis not present

## 2022-05-10 DIAGNOSIS — Z0000 Encounter for general adult medical examination without abnormal findings: Secondary | ICD-10-CM

## 2022-05-10 DIAGNOSIS — G894 Chronic pain syndrome: Secondary | ICD-10-CM | POA: Diagnosis not present

## 2022-05-10 DIAGNOSIS — Z23 Encounter for immunization: Secondary | ICD-10-CM

## 2022-05-10 NOTE — Patient Instructions (Addendum)
Thank you for coming for your annual wellness visit.  Please follow through on any advice that was given to you by today's visit. Remember to maintain compliance with your medications as discussed today.  Also remember it is important to eat a healthy diet and to stay physically active on a daily basis.  Please follow through with any testing or recommended followup office visits as was discussed today. You are due the following test coming up:  Colonoscopy up to date. Due 2028 Lung Cancer up to date. Due 2024 HIV Screening up to date Hep C Screening up to date Urine ACR up to date. Due 07/2022 GFR up to date. Due 07/2022 Flu Vaccine today Shingles Vaccine in next 4 weeks COVID Vaccine in next 4 weeks       Finally remembered that the annual wellness visit does not take the place of regularly scheduled office visits  chronic health problems such as hypertension/diabetes/cholesterol visits.

## 2022-05-10 NOTE — Progress Notes (Signed)
   Subjective:    Patient ID: [name], male    DOB: [birth year], 66 y.o.   MRN: [id number]  HPI AWV- Annual Wellness Visit  The patient was seen for their annual wellness visit. The patient's past medical history, surgical history, and family history were reviewed. Pertinent vaccines were reviewed ( tetanus, pneumonia, shingles, flu) The patient's medication list was reviewed and updated.  The height and weight were entered.  BMI recorded in electronic record elsewhere  Cognitive screening was completed. Outcome of Mini - Cog: Fail (2/5)   Falls /depression screening electronically recorded within record elsewhere  Current tobacco usage: smokes some; one pack will last about 2 days (All patients who use tobacco were given written and verbal information on quitting)  Recent listing of emergency department/hospitalizations over the past year were reviewed.  current specialist the patient sees on a regular basis: none   Medicare annual wellness visit patient questionnaire was reviewed.  A written screening schedule for the patient for the next 5-10 years was given. Appropriate discussion of followup regarding next visit was discussed.      Review of Systems  All other systems reviewed and are negative.      Objective:   Physical Exam Vitals reviewed.  Constitutional:      General: He is not in acute distress.    Appearance: Normal appearance. He is normal weight. He is not ill-appearing or toxic-appearing.  HENT:     Head: Normocephalic and atraumatic.  Neurological:     Mental Status: He is alert.  Psychiatric:        Mood and Affect: Mood normal.        Behavior: Behavior normal.           Assessment & Plan:   1. Medicare annual wellness visit, initial Adult wellness-complete.wellness physical was conducted today. Importance of diet and exercise were discussed in detail.  Importance of stress reduction and healthy living were discussed.  In  addition to this a discussion regarding safety was also covered.  We also reviewed over immunizations and gave recommendations regarding current immunization needed for age.   In addition to this additional areas were also touched on including: Preventative health exams needed:  Colonoscopy up to date. Due 2028 Lung Cancer up to date. Due 2024 HIV Screening up to date Hep C Screening up to date Urine ACR up to date. Due 07/2022 GFR up to date. Due 07/2022 Flu Vaccine today Shingles Vaccine in next 4 weeks COVID Vaccine in next 4 weeks   Patient was advised yearly wellness exam  2. Immunization Due - Flu Vaccine today  3. Chronic Pain/Forgetfulness - Patient to schedule with PCP for further evaluation within 2 weeks

## 2022-05-17 ENCOUNTER — Encounter: Payer: Self-pay | Admitting: *Deleted

## 2022-05-17 ENCOUNTER — Other Ambulatory Visit: Payer: Self-pay | Admitting: *Deleted

## 2022-05-17 DIAGNOSIS — E785 Hyperlipidemia, unspecified: Secondary | ICD-10-CM

## 2022-05-17 DIAGNOSIS — I25118 Atherosclerotic heart disease of native coronary artery with other forms of angina pectoris: Secondary | ICD-10-CM

## 2022-05-25 ENCOUNTER — Ambulatory Visit: Payer: Medicare HMO | Admitting: Family Medicine

## 2022-06-16 ENCOUNTER — Other Ambulatory Visit: Payer: Self-pay | Admitting: Family Medicine

## 2022-06-23 ENCOUNTER — Other Ambulatory Visit (HOSPITAL_COMMUNITY): Payer: Self-pay | Admitting: Psychiatry

## 2022-06-23 ENCOUNTER — Other Ambulatory Visit: Payer: Self-pay | Admitting: Family Medicine

## 2022-06-23 NOTE — Telephone Encounter (Signed)
1.  This his pain medicine requires pain management visit every 3 months.  He has been on these medicines for quite some time.  He should know this.  Please schedule him an office visit somewhere within the course of the next 3-4 weeks.  Then I will refill it but until then no refill

## 2022-06-23 NOTE — Telephone Encounter (Signed)
Call for appt

## 2022-06-24 NOTE — Telephone Encounter (Signed)
Please contact patient to have him schedule pain med visit. Thank you

## 2022-06-30 ENCOUNTER — Ambulatory Visit (INDEPENDENT_AMBULATORY_CARE_PROVIDER_SITE_OTHER): Payer: Medicare HMO | Admitting: Family Medicine

## 2022-06-30 ENCOUNTER — Other Ambulatory Visit (HOSPITAL_COMMUNITY): Payer: Self-pay | Admitting: Psychiatry

## 2022-06-30 VITALS — BP 134/79 | HR 74 | Temp 99.0°F | Ht 71.0 in | Wt 167.2 lb

## 2022-06-30 DIAGNOSIS — Z125 Encounter for screening for malignant neoplasm of prostate: Secondary | ICD-10-CM | POA: Diagnosis not present

## 2022-06-30 DIAGNOSIS — G894 Chronic pain syndrome: Secondary | ICD-10-CM

## 2022-06-30 DIAGNOSIS — R7303 Prediabetes: Secondary | ICD-10-CM

## 2022-06-30 DIAGNOSIS — E782 Mixed hyperlipidemia: Secondary | ICD-10-CM | POA: Diagnosis not present

## 2022-06-30 DIAGNOSIS — J449 Chronic obstructive pulmonary disease, unspecified: Secondary | ICD-10-CM | POA: Diagnosis not present

## 2022-06-30 DIAGNOSIS — M5432 Sciatica, left side: Secondary | ICD-10-CM | POA: Diagnosis not present

## 2022-06-30 DIAGNOSIS — I10 Essential (primary) hypertension: Secondary | ICD-10-CM | POA: Diagnosis not present

## 2022-06-30 DIAGNOSIS — I25118 Atherosclerotic heart disease of native coronary artery with other forms of angina pectoris: Secondary | ICD-10-CM

## 2022-06-30 DIAGNOSIS — F324 Major depressive disorder, single episode, in partial remission: Secondary | ICD-10-CM | POA: Diagnosis not present

## 2022-06-30 MED ORDER — HYDROCODONE-ACETAMINOPHEN 5-325 MG PO TABS: 1.0000 | ORAL_TABLET | Freq: Two times a day (BID) | ORAL | 0 refills | Status: DC | PRN

## 2022-06-30 MED ORDER — HYDROCODONE-ACETAMINOPHEN 5-325 MG PO TABS: ORAL_TABLET | ORAL | 0 refills | Status: DC

## 2022-06-30 MED ORDER — FLUOXETINE HCL 40 MG PO CAPS: 40.0000 mg | ORAL_CAPSULE | Freq: Two times a day (BID) | ORAL | 2 refills | Status: DC

## 2022-06-30 NOTE — Progress Notes (Signed)
Subjective:    Patient ID: [name], male    DOB: July 23, 1955, 67 y.o.   MRN: [id number]  HPI This patient was seen today for chronic pain  The medication list was reviewed and updated.  Location of Pain for which the patient has been treated with regarding narcotics: Has sciatica into both legs also low back pain  Onset of this pain: Present for years   -Compliance with medication: Relates good compliance  - Number patient states they take daily: He does not take much some days none some days 1 some days 2 pills in a day  -when was the last dose patient took?  Yesterday  The patient was advised the importance of maintaining medication and not using illegal substances with these.  Here for refills and follow up  The patient was educated that we can provide 3 monthly scripts for their medication, it is their responsibility to follow the instructions.  Side effects or complications from medications: Denies side effects  Patient is aware that pain medications are meant to minimize the severity of the pain to allow their pain levels to improve to allow for better function. They are aware of that pain medications cannot totally remove their pain.  Due for UDT ( at least once per year) : 01/24/2022  Scale of 1 to 10 ( 1 is least 10 is most) Your pain level without the medicine: 8 Your pain level with medication 4  Scale 1 to 10 ( 1-helps very little, 10 helps very well) How well does your pain medication reduce your pain so you can function better through out the day? 7  Quality of the pain: Aching  Persistence of the pain: Comes and goes  Modifying factors: Worse with activity  Essential hypertension - Plan: Basic Metabolic Panel, Lipid panel, Hepatic Function Panel  Stage 2 moderate COPD by GOLD classification (HCC)  Coronary artery disease involving native coronary artery of native heart with other form of angina pectoris ([name]), Chronic  Depression, major, single  episode, in partial remission ([name]), Chronic  Mixed hyperlipidemia - Plan: Basic Metabolic Panel, Lipid panel, Hepatic Function Panel  Chronic pain syndrome  Sciatica, left side  Screening PSA (prostate specific antigen) - Plan: PSA  Prediabetes - Plan: Hemoglobin A1c [name] has been experiencing left-sided leg burning pain and discomfort that intermittently comes and goes more than likely this is sciatica issues currently right now not causing any weakness Patient here for follow-up regarding cholesterol.    Patient relates taking medication on a regular basis Denies problems with medication Importance of dietary measures discussed Regular lab work regarding lipid and liver was checked and if needing additional labs was appropriately ordered Takes his atorvastatin on a regular basis Does have a history of heart disease not having any chest tightness pressure pain currently Has intermittent shortness of breath associated with his COPD smoking history but he does use his Trelegy on a regular basis He does see Dr. Harrington [name] on a fairly regular basis for stress and psychiatric needs      Review of Systems     Objective:   Physical Exam General-in no acute distress Eyes-no discharge Lungs-respiratory rate normal, CTA CV-no murmurs,RRR Extremities skin warm dry no edema Neuro grossly normal Behavior normal, alert        Assessment & Plan:  1. Essential hypertension HTN- patient seen for follow-up regarding HTN.   Diet, medication compliance, appropriate labs and refills were completed.   Importance of keeping blood pressure  under good control to lessen the risk of complications discussed Regular follow-up visits discussed  - Basic Metabolic Panel - Lipid panel - Hepatic Function Panel  2. Stage 2 moderate COPD by GOLD classification ([name]) Does have underlying COPD uses his inhaler I would encourage him to quit smoking he states that he is trying to cut back even more unable  to quit just yet  3. Coronary artery disease involving native coronary artery of native heart with other form of angina pectoris ([name]) Does get a little bit out of breath with activity but does not have any significant chest tightness pressure pain currently  4. Depression, major, single episode, in partial remission (HCC) Sees Dr. Harrington [name] on a regular basis needs to do a follow-up visit emotions currently stable  5. Mixed hyperlipidemia Hyperlipidemia-importance of diet, weight control, activity, compliance with medications discussed.   Recent labs reviewed.   Any additional labs or refills ordered.   Importance of keeping under good control discussed. Regular follow-up visits discussed  - Basic Metabolic Panel - Lipid panel - Hepatic Function Panel  6. Chronic pain syndrome The patient was seen in followup for chronic pain. A review over at their current pain status was discussed. Drug registry was checked. Prescriptions were given.  Regular follow-up recommended. Discussion was held regarding the importance of compliance with medication as well as pain medication contract.  Patient was informed that medication may cause drowsiness and should not be combined  with other medications/alcohol or street drugs. If the patient feels medication is causing altered alertness then do not drive or operate dangerous equipment.  Should be noted that the patient appears to be meeting appropriate use of opioids and response.  Evidenced by improved function and decent pain control without significant side effects and no evidence of overt aberrancy issues.  Upon discussion with the patient today they understand that opioid therapy is optional and they feel that the pain has been refractory to reasonable conservative measures and is significant and affecting quality of life enough to warrant ongoing therapy and wishes to continue opioids.  Refills were provided.  Drug registry checked 3 prescription sent  in  7. Sciatica, left side Stretches recommended hydrocodone only when necessary  8. Screening PSA (prostate specific antigen) Screening - PSA  9. Prediabetes A1c ordered healthy diet recommended - Hemoglobin A1c

## 2022-07-15 DIAGNOSIS — R7303 Prediabetes: Secondary | ICD-10-CM | POA: Diagnosis not present

## 2022-07-15 DIAGNOSIS — E782 Mixed hyperlipidemia: Secondary | ICD-10-CM | POA: Diagnosis not present

## 2022-07-15 DIAGNOSIS — Z125 Encounter for screening for malignant neoplasm of prostate: Secondary | ICD-10-CM | POA: Diagnosis not present

## 2022-07-15 DIAGNOSIS — I10 Essential (primary) hypertension: Secondary | ICD-10-CM | POA: Diagnosis not present

## 2022-07-16 ENCOUNTER — Encounter: Payer: Self-pay | Admitting: Family Medicine

## 2022-07-16 LAB — HEMOGLOBIN A1C
Est. average glucose Bld gHb Est-mCnc: 105 mg/dL
Hgb A1c MFr Bld: 5.3 % (ref 4.8–5.6)

## 2022-07-16 LAB — HEPATIC FUNCTION PANEL
ALT: 61 IU/L — ABNORMAL HIGH (ref 0–44)
AST: 36 IU/L (ref 0–40)
Albumin: 4.2 g/dL (ref 3.9–4.9)
Alkaline Phosphatase: 132 IU/L — ABNORMAL HIGH (ref 44–121)
Bilirubin Total: 0.8 mg/dL (ref 0.0–1.2)
Bilirubin, Direct: 0.24 mg/dL (ref 0.00–0.40)
Total Protein: 6.8 g/dL (ref 6.0–8.5)

## 2022-07-16 LAB — LIPID PANEL
Chol/HDL Ratio: 4.1 ratio (ref 0.0–5.0)
Cholesterol, Total: 131 mg/dL (ref 100–199)
HDL: 32 mg/dL — ABNORMAL LOW (ref >39)
LDL Chol Calc (NIH): 75 mg/dL (ref 0–99)
Triglycerides: 137 mg/dL (ref 0–149)
VLDL Cholesterol Cal: 24 mg/dL (ref 5–40)

## 2022-07-16 LAB — BASIC METABOLIC PANEL
BUN/Creatinine Ratio: 10 (ref 10–24)
BUN: 9 mg/dL (ref 8–27)
CO2: 22 mmol/L (ref 20–29)
Calcium: 9.4 mg/dL (ref 8.6–10.2)
Chloride: 103 mmol/L (ref 96–106)
Creatinine, Ser: 0.9 mg/dL (ref 0.76–1.27)
Glucose: 85 mg/dL (ref 70–99)
Potassium: 4.4 mmol/L (ref 3.5–5.2)
Sodium: 141 mmol/L (ref 134–144)
eGFR: 95 mL/min/{1.73_m2} (ref >59)

## 2022-07-16 LAB — PSA: Prostate Specific Ag, Serum: 1.5 ng/mL (ref 0.0–4.0)

## 2022-07-23 ENCOUNTER — Other Ambulatory Visit (HOSPITAL_COMMUNITY): Payer: Self-pay | Admitting: Psychiatry

## 2022-07-26 NOTE — Telephone Encounter (Signed)
Call for appt

## 2022-08-12 ENCOUNTER — Ambulatory Visit (INDEPENDENT_AMBULATORY_CARE_PROVIDER_SITE_OTHER): Payer: Medicare HMO | Admitting: Psychiatry

## 2022-08-12 ENCOUNTER — Encounter (HOSPITAL_COMMUNITY): Payer: Self-pay | Admitting: Psychiatry

## 2022-08-12 VITALS — BP 106/69 | HR 67 | Ht 71.0 in | Wt 158.6 lb

## 2022-08-12 DIAGNOSIS — F5105 Insomnia due to other mental disorder: Secondary | ICD-10-CM

## 2022-08-12 DIAGNOSIS — F418 Other specified anxiety disorders: Secondary | ICD-10-CM | POA: Diagnosis not present

## 2022-08-12 MED ORDER — ALPRAZOLAM 1 MG PO TABS: 1.0000 mg | ORAL_TABLET | Freq: Three times a day (TID) | ORAL | 2 refills | Status: DC

## 2022-08-12 MED ORDER — OLANZAPINE 10 MG PO TABS: 10.0000 mg | ORAL_TABLET | Freq: Every day | ORAL | 2 refills | Status: DC

## 2022-08-12 MED ORDER — FLUOXETINE HCL 40 MG PO CAPS: 40.0000 mg | ORAL_CAPSULE | Freq: Two times a day (BID) | ORAL | 2 refills | Status: DC

## 2022-08-12 MED ORDER — TRAZODONE HCL 50 MG PO TABS: ORAL_TABLET | ORAL | 1 refills | Status: DC

## 2022-08-12 NOTE — Progress Notes (Signed)
BH MD/PA/NP OP Progress Note  08/12/2022 9:20 AM [name]  MRN:  [id number]  Chief Complaint:  Chief Complaint  Patient presents with   Depression   Anxiety   Follow-up   HPI: This patient is a 67 year old married white male who lives with his wife and 2 grandchildren in [place].  He is on disability for coronary artery disease and COPD.  He used to work for [employer].  The patient returns for follow-up after about 5 months.  He comes in person this time and does look thin and a bit frail.  However he states he has been doing okay.  He and his wife are getting a new modular home built and he is excited about this.  He does have his 2 grandchildren living in the home and they keep him busy.  He worries a lot that he will have more heart disease since he had a CABG in 2022.  All of his labs look good and his cardiologist thinks he is doing well however he really needs to quit smoking and he realizes this.  He states most of the time he is not depressed.  He states he has occasional thoughts that he would be "better off dead" but he states he would never actually act on this as his family means a lot to him.  He does have shotguns at home but claims he would never use them to harm himself.  His appetite is still not great as he has not gotten his dentures yet and it is hard for him to eat.  He is sleeping well most of the time Visit Diagnosis:    ICD-10-CM   1. Depression with anxiety  F41.8     2. 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 s/p PTCA to LCx in 1993 b. BMS to RCA in 01/2012 with residual 80% OM2 stenosis and medical management recommended c. 08/2019: cath showing patent stents with occluded OM2 --> medical management. d. s/p CABG in 02/2021 with LIMA-LAD,  reverse SVG-D1 and SVG-OM   Asthma    CAD (coronary artery disease)    Carcinoma in situ of colon 2004   rectal polyp   Colitis, ischemic ([place]) 2011   COPD (chronic obstructive pulmonary disease) (HCC)    mild ;excercise induced hypoxemia by cp stress test ;asthma ,bronchitis,   Depression    Diverticulosis    Gastritis 12/30/2010   EGD Dr [name]   GERD (gastroesophageal reflux disease)    GI bleed    H. 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)    Leukocytosis    [name]   Lung nodule 07/16/2011   Myocardial infarction [facility])    age 29   Restless leg syndrome    Schatzki's ring    Sleep apnea    does not use CPAP:cannot tolerate, PCP aware   Syncope    Tobacco abuse    50 pack years continuing at one halp pack daily   Tubular adenoma of colon 06/2009   Colonosocpy Dr [name]    Past Surgical History:  Procedure Laterality Date   BIOPSY  12/19/2017   Procedure: BIOPSY;  Surgeon: [name], MD;  [facility];  Service: Endoscopy;;  gastric   BIOPSY  11/05/2021   Procedure: BIOPSY;  Surgeon: [name], MD;  [facility];  Service: Endoscopy;;   CARDIAC CATHETERIZATION     CHOLECYSTECTOMY  2004   COLONOSCOPY  06/2009   normal terminal ileum, segmental mild inflammation of sigmoid colon (bx unremarkable), polyp, tubular adenoma   COLONOSCOPY N/A 07/31/2013   Dr.Rourk- redundant anal canal hemorrhoids, colonic diverticulosis, tubular adenoma   COLONOSCOPY W/ POLYPECTOMY  2004   rectal polyp with carcinoma in situ removed via colonoscopy   COLONOSCOPY WITH PROPOFOL N/A 09/15/2015   Dr. [name]: Scattered diverticula throughout the colon, 2 sessile polyps found in the descending colon and cecum, 5 mm in size.  Cecal polyp was sessile serrated polyp, descending colon polyp was a tubular adenoma.  He had a abnormal perianal exam  along with grade 3 hemorrhoids.  Surveillance exam recommended for 5-year follow-up.   COLONOSCOPY WITH PROPOFOL N/A 12/19/2017   one 5 mm polyp in ascending colon and 1 cm sessile polyp in ascending s/p removal. Tubular adenoma. internal hemorrhoids   COLONOSCOPY WITH PROPOFOL N/A 11/05/2021   Procedure: COLONOSCOPY WITH PROPOFOL;  Surgeon: [name], MD;  [facility];  Service: Endoscopy;  Laterality: N/A;  12:30pm   CORONARY ANGIOPLASTY WITH STENT PLACEMENT  01/26/2012   "1; total is now 2"   CORONARY ARTERY BYPASS GRAFT N/A 03/13/2021   Procedure: CORONARY ARTERY BYPASS GRAFTING (CABG), ON PUMP, TIMES THREE, USING LEFT INTERNAL MAMMARY ARTERY AND RIGHT ENDOSCOPICALLY HARVESTED GREATER SAPHENOUS VEIN;  Surgeon: Lajuana Matte, MD;  Location: [place];  Service: Open Heart Surgery;  Laterality: N/A;   CORONARY/GRAFT ACUTE MI REVASCULARIZATION N/A 03/10/2021   Procedure: Coronary/Graft Acute MI Revascularization;  Surgeon: Belva Crome, MD;  Location: [place] [facility];  Service: Cardiovascular;  Laterality: N/A;   ENDOVEIN HARVEST OF GREATER SAPHENOUS VEIN Right 03/13/2021   Procedure: ENDOVEIN HARVEST OF GREATER SAPHENOUS VEIN;  Surgeon: Lajuana Matte, MD;  Location: [place];  Service: Open Heart Surgery;  Laterality: Right;   ESOPHAGOGASTRODUODENOSCOPY  02/2009   query Barrett's but bx negative   ESOPHAGOGASTRODUODENOSCOPY  12/30/2010   [name],gastritis, dilated 31F, sm HH, 1 small ulcer, Duodenal erosions, benign bx   ESOPHAGOGASTRODUODENOSCOPY (EGD) WITH ESOPHAGEAL DILATION N/A 09/12/2012   [id number] Schatzki's ring s/p Maloney dilator. Small hiatal hernia. negative path   ESOPHAGOGASTRODUODENOSCOPY (EGD) WITH ESOPHAGEAL DILATION N/A 07/31/2013   Dr. [name]- normal egd, s/p [facility] dilation empirically. Normal small bowel biopsies    ESOPHAGOGASTRODUODENOSCOPY (EGD) WITH PROPOFOL N/A 09/15/2015   Dr. [name]: Medium sized hiatal hernia, normal-appearing esophagus  status post empiric dilation   ESOPHAGOGASTRODUODENOSCOPY (EGD) WITH PROPOFOL N/A 12/19/2017   erosive esophagitis s/p dilation, erosive gastropathy, normal duodenum   ESOPHAGOGASTRODUODENOSCOPY (EGD) WITH PROPOFOL N/A 09/17/2019   Non-obstructing Schatzki's ring s/p dilation, medium-sized hiatal hernia, otherwise normal.   ESOPHAGOGASTRODUODENOSCOPY (EGD) WITH PROPOFOL N/A 11/05/2021   Procedure: ESOPHAGOGASTRODUODENOSCOPY (EGD) WITH PROPOFOL;  Surgeon: [name], MD;  [facility];  Service: Endoscopy;  Laterality: N/A;   FLEXIBLE SIGMOIDOSCOPY  12/30/2010    [name],; internal hemorrhoids, anal papilla   HAND SURGERY     surgical intervention for injury of the fingers of the left hand many years ago   heart stent     HEMORRHOID BANDING  Dr. [name]   LEFT HEART CATH AND CORONARY ANGIOGRAPHY N/A 08/29/2019   Procedure: LEFT HEART CATH AND CORONARY ANGIOGRAPHY;  Surgeon: [name], MD;  Location: [place] [facility];  Service: Cardiovascular;  Laterality: N/A;   LEFT HEART CATH AND CORONARY ANGIOGRAPHY N/A 03/10/2021   Procedure: LEFT HEART CATH AND CORONARY ANGIOGRAPHY;  Surgeon: Belva Crome, MD;  Location: [place] [facility];  Service: Cardiovascular;  Laterality: N/A;   LEFT HEART CATHETERIZATION WITH CORONARY ANGIOGRAM N/A 01/26/2012   Procedure: LEFT HEART CATHETERIZATION WITH CORONARY ANGIOGRAM;  Surgeon: [name], MD;  Location: [facility] C[facility];  Service: Cardiovascular;  Laterality: N/A;   MALONEY DILATION N/A 09/15/2015   Procedure: [name] DILATION;  Surgeon: [name], MD;  [facility];  Service: Endoscopy;  Laterality: N/A;   MALONEY DILATION N/A 12/19/2017   Procedure: [name] DILATION;  Surgeon: [name], MD;  [facility];  Service: Endoscopy;  Laterality: N/A;   MALONEY DILATION N/A 09/17/2019   Procedure: [name] DILATION;  Surgeon: [name], MD;  [facility];  Service: Endoscopy;  Laterality: N/A;    MALONEY DILATION N/A 11/05/2021   Procedure: [name] DILATION;  Surgeon: [name], MD;  [facility];  Service: Endoscopy;  Laterality: N/A;   NASAL SEPTOPLASTY W/ TURBINOPLASTY  10/06/2011   Procedure: NASAL SEPTOPLASTY WITH TURBINATE REDUCTION;  Surgeon: Izora Gala, MD;  Location: [place];  Service: ENT;  Laterality: Bilateral;   PERCUTANEOUS CORONARY STENT INTERVENTION (PCI-S) N/A 01/26/2012   Procedure: PERCUTANEOUS CORONARY STENT INTERVENTION (PCI-S);  Surgeon: [name], MD;  Location: [facility] CATH LAB;  Service: Cardiovascular;  Laterality: N/A;   POLYPECTOMY  09/15/2015   Procedure: POLYPECTOMY;  Surgeon: [name], MD;  [facility];  Service: Endoscopy;;  Cecal polyp removed via cold snare/ Descending colon polyp removed via cold snare   POLYPECTOMY  12/19/2017   Procedure: POLYPECTOMY;  Surgeon: [name], MD;  [facility];  Service: Endoscopy;;  colon   TEE WITHOUT CARDIOVERSION N/A 03/13/2021   Procedure: TRANSESOPHAGEAL ECHOCARDIOGRAM (TEE);  Surgeon: Lajuana Matte, MD;  Location: [place];  Service: Open Heart Surgery;  Laterality: N/A;    Family Psychiatric History: See below Family History:  Family History  Problem Relation Age of Onset   Lung disease Father        deceased, black lung   Heart disease Mother        blood clots   Depression Mother    Cancer Paternal Uncle        unknown type   Cancer Maternal Aunt        unknown type   Kidney failure Maternal Uncle    Hypertension Brother    Colon cancer Neg Hx    ADD / ADHD Neg Hx    Alcohol abuse Neg Hx    Drug abuse Neg Hx    Anxiety disorder Neg Hx    Bipolar disorder Neg Hx    Dementia Neg Hx    OCD Neg Hx    Paranoid behavior Neg Hx    Schizophrenia Neg Hx    Physical abuse Neg Hx    Sexual abuse Neg Hx    Seizures Neg Hx     Social History:  Social History   Socioeconomic History   Marital status: Married    Spouse name: Not on file   Number of  children: 3   Years of education: Not  on file   Highest education level: Not on file  Occupational History   Occupation: disable    Employer: RETIRED    Comment: [employer]  Tobacco Use   Smoking status: Every Day    Packs/day: 1.00    Years: 48.00    Total pack years: 48.00    Types: Cigarettes    Start date: 07/31/1969    Last attempt to quit: 03/16/2021    Years since quitting: 1.4   Smokeless tobacco: Former  Scientific laboratory technician Use: Never used  Substance and Sexual Activity   Alcohol use: Yes    Comment: Drinks a beer occasionally   Drug use: No   Sexual activity: Not Currently  Other Topics Concern   Not on file  Social History Narrative   3 stepchildren   Social Determinants of Health   Financial Resource Strain: Not on file  Food Insecurity: Not on file  Transportation Needs: Not on file  Physical Activity: Not on file  Stress: Not on file  Social Connections: Not on file    Allergies: No Known Allergies  Metabolic Disorder Labs: Lab Results  Component Value Date   HGBA1C 5.3 07/15/2022   MPG 94 03/11/2021   MPG 103 10/29/2013   No results found for: "PROLACTIN" Lab Results  Component Value Date   CHOL 131 07/15/2022   TRIG 137 07/15/2022   HDL 32 (L) 07/15/2022   CHOLHDL 4.1 07/15/2022   VLDL 16 03/11/2021   LDLCALC 75 07/15/2022   LDLCALC 96 07/21/2021   Lab Results  Component Value Date   TSH 1.154 03/11/2021   TSH 1.011 02/09/2010    Therapeutic Level Labs: No results found for: "LITHIUM" No results found for: "VALPROATE" No results found for: "CBMZ"  Current Medications: Current Outpatient Medications  Medication Sig Dispense Refill   albuterol (VENTOLIN HFA) 108 (90 Base) MCG/ACT inhaler Inhale 2 puffs into the lungs every 4 (four) hours as needed for wheezing or shortness of breath. 18 g 1   amLODipine (NORVASC) 5 MG tablet TAKE 1 TABLET ONE TIME DAILY 90 tablet 1   aspirin EC 81 MG tablet Take 81 mg by mouth daily.     atorvastatin  (LIPITOR) 80 MG tablet TAKE 1 TABLET EVERY DAY 90 tablet 3   Cyanocobalamin (VITAMIN B-12 PO) Take 1 tablet by mouth in the morning.     Fluticasone-Umeclidin-Vilant (TRELEGY ELLIPTA) 100-62.5-25 MCG/ACT AEPB Inhale 1 puff into the lungs daily. 180 each 3   gabapentin (NEURONTIN) 300 MG capsule Take 2 capsules (600 mg total) by mouth 3 (three) times daily. 540 capsule 1   HYDROcodone-acetaminophen (NORCO/VICODIN) 5-325 MG tablet Take 1 tablet by mouth 2 (two) times daily as needed for moderate pain. 45 tablet 0   HYDROcodone-acetaminophen (NORCO/VICODIN) 5-325 MG tablet Take 1 tablet by mouth 2 (two) times daily as needed for moderate pain. 45 tablet 0   HYDROcodone-acetaminophen (NORCO/VICODIN) 5-325 MG tablet 1 twice daily as needed pain to last 30 days 45 tablet 0   isosorbide mononitrate (IMDUR) 30 MG 24 hr tablet Take 30 mg by mouth daily.     lipase/protease/amylase (CREON) 36000 UNITS CPEP capsule Take 2 capsules (72,000 Units total) by mouth 3 (three) times daily with meals. May also take 1 capsule (36,000 Units total) as needed (with snacks). (Patient taking differently: Take 2 capsules (72,000 Units total) by mouth 3 (three) times daily with meals.) 240 capsule 3   meloxicam (MOBIC) 15 MG tablet TAKE 1 TABLET EVERY DAY 90  tablet 1   metoprolol succinate (TOPROL-XL) 50 MG 24 hr tablet TAKE 1 TABLET EVERY DAY 90 tablet 3   nitroGLYCERIN (NITROSTAT) 0.4 MG SL tablet Place 1 tablet (0.4 mg total) under the tongue every 5 (five) minutes as needed for chest pain. 20 tablet 5   pantoprazole (PROTONIX) 40 MG tablet TAKE 1 TABLET TWICE DAILY BEFORE MEALS 180 tablet 3   polyethylene glycol-electrolytes (NULYTELY) 420 g solution As directed 4000 mL 0   rOPINIRole (REQUIP) 2 MG tablet TAKE 1 TABLET AT BEDTIME 90 tablet 1   tamsulosin (FLOMAX) 0.4 MG CAPS capsule TAKE 1 CAPSULE EVERY DAY 90 capsule 1   ALPRAZolam (XANAX) 1 MG tablet Take 1 tablet (1 mg total) by mouth 3 (three) times daily. 90 tablet 2    FLUoxetine (PROZAC) 40 MG capsule Take 1 capsule (40 mg total) by mouth 2 (two) times daily. 180 capsule 2   OLANZapine (ZYPREXA) 10 MG tablet Take 1 tablet (10 mg total) by mouth at bedtime. 90 tablet 2   traZODone (DESYREL) 50 MG tablet TAKE 1 TABLET AT BEDTIME 90 tablet 1   No current facility-administered medications for this visit.     Musculoskeletal: Strength & Muscle Tone: within normal limits Gait & Station: normal Patient leans: N/A  Psychiatric Specialty Exam: Review of Systems  Constitutional:  Positive for fatigue.  Musculoskeletal:  Positive for arthralgias and back pain.  All other systems reviewed and are negative.   Blood pressure 106/69, pulse 67, height '5\' 11"'$  (1.803 m), weight 158 lb 9.6 oz (71.9 kg), SpO2 91 %.Body mass index is 22.12 kg/m.  General Appearance: Casual and Disheveled  Eye Contact:  Good  Speech:  Clear and Coherent  Volume:  Decreased  Mood:  Anxious  Affect:  Congruent  Thought Process:  Goal Directed  Orientation:  Full (Time, Place, and Person)  Thought Content: Rumination   Suicidal Thoughts:  No  Homicidal Thoughts:  No  Memory:  Immediate;   Good Recent;   Fair Remote;   NA  Judgement:  Good  Insight:  Fair  Psychomotor Activity:  Decreased  Concentration:  Concentration: Good and Attention Span: Good  Recall:  Good  Fund of Knowledge: Good  Language: Good  Akathisia:  No  Handed:  Right  AIMS (if indicated): not done  Assets:  Communication Skills Desire for Improvement Resilience Social Support  ADL's:  Intact  Cognition: WNL  Sleep:  Good   Screenings: GAD-7    Flowsheet Row Office Visit from 08/12/2022 in [facility] at [place] Visit from 06/30/2022 in [place] from 05/10/2022 in [place] Visit from 01/05/2021 in [place] Visit from 01/16/2020 in [place]  Total GAD-7 Score '9 7 10 13 13      '$ PHQ2-9    [place] Office Visit from 08/12/2022 in [place] at [place] from 06/30/2022 in [place] from 05/10/2022 in [place] Video Visit from 02/17/2022 in [place] at [place] Visit from 01/20/2022 in [facility]  PHQ-2 Total Score 0 '2 2 2 '$ 0  PHQ-9 Total Score '4 6 12 6 '$ --      [place] Office Visit from 08/12/2022 in [facility] at [place] Video Visit from 02/17/2022 in [place] at [place] [facility]  from 01/30/2022 in [facility] Emergency Department at [place] No Risk No Risk No Risk        Assessment and Plan: This patient is a 67 year old male with a history of depression anxiety and PTSD regarding both of his children's death.  He seems to be stable on his current regimen.  He will continue Prozac 80 mg daily for depression, Xanax 1 mg 3 times daily for anxiety, gabapentin 600 mg twice daily for anxiety and olanzapine 10 mg at bedtime for mood stabilization.  He will return to see me in 3 months  Collaboration of Care: Collaboration of Care: Primary Care Provider AEB notes are shared with PCP on the epic system  Patient/Guardian was advised Release of Information must be obtained prior to any record release in order to collaborate their care with an outside provider. Patient/Guardian was advised if they have not already done so to contact the registration department to sign all necessary forms in order for [place] to release information regarding their care.   Consent: Patient/Guardian gives verbal consent for treatment and assignment of benefits for services provided during this visit. Patient/Guardian expressed understanding and agreed to proceed.     [name], MD 08/12/2022, 9:20 AM

## 2022-08-26 ENCOUNTER — Other Ambulatory Visit: Payer: Self-pay | Admitting: Primary Care

## 2022-08-26 ENCOUNTER — Other Ambulatory Visit (HOSPITAL_COMMUNITY): Payer: Self-pay | Admitting: Psychiatry

## 2022-08-26 ENCOUNTER — Other Ambulatory Visit: Payer: Self-pay | Admitting: Family Medicine

## 2022-08-28 NOTE — Telephone Encounter (Signed)
He may have 6 months on 3 of the refills but valsartan please message to pharmacy that his specialist stopped it thank you

## 2022-09-15 ENCOUNTER — Ambulatory Visit: Payer: Medicare HMO | Attending: Cardiology | Admitting: Cardiology

## 2022-09-15 ENCOUNTER — Encounter: Payer: Self-pay | Admitting: Cardiology

## 2022-09-15 VITALS — BP 114/72 | HR 62 | Ht 71.0 in | Wt 155.4 lb

## 2022-09-15 DIAGNOSIS — E782 Mixed hyperlipidemia: Secondary | ICD-10-CM

## 2022-09-15 DIAGNOSIS — I25118 Atherosclerotic heart disease of native coronary artery with other forms of angina pectoris: Secondary | ICD-10-CM

## 2022-09-15 DIAGNOSIS — I10 Essential (primary) hypertension: Secondary | ICD-10-CM

## 2022-09-15 NOTE — Patient Instructions (Signed)
Medication Instructions:  Your physician has recommended you make the following change in your medication:  STOP Plavix STOP Valsartan Continue all other medications as directed  Labwork: none  Testing/Procedures: none  Follow-Up:  Your physician recommends that you schedule a follow-up appointment in: 6 months  Any Other Special Instructions Will Be Listed Below (If Applicable).  If you need a refill on your cardiac medications before your next appointment, please call your pharmacy.

## 2022-09-15 NOTE — Progress Notes (Signed)
Clinical Summary [name] is a 67 y.o.male seen today for follow up of the following medical problems.    1. CAD - hx of infeior MI with prior PTCA to LCX in 1993 -  cath 2011 LM patent, LAD prox 40%, LCX prox 40%, OM1 30%, OM2 80% small too small for intervention, RCA very small non dom, 90%. LVEF 50-55% by LV gram.   08/2011 echo LVEF [id number], grade I diastolic dysfunction.   03/2016 echo: LVEF [id number], normal diastolic function      [id number] nuclear stress: inferior /inferolaterla infarct without ischemia. LVEF 45%   - no recent chest pain. Mild SOB at rest and with activity. Some cough. Compliant with inhalers -compliant with meds     02/2021 CABG LIMA-LAD, reverse SVG-D1 and SVG-OM, presented initially with ACS 02/2021 echo: LVEF 45-50%, mild to mod MR    - off and on, sharp pain midchest along incision site. Lasts few seconds. Can be positional. Has been chronic since bypass - compliant with meds       2. Hyperlipidemia 07/2021 TC 158 TG 99 HDL 44 LDL 96 - 07/2022 TC 131 TG 137 HDL 32 LDL 75 - compliant with meds   3. HTN - he is compliant with meds - soft bp's, looks to be off valsartan.    4. COPD - followed by pcp     5. OSA - does not use CPAP machine due to discomfort     6. GERD/Dysphagia - followed by GI     7. Carotid stenosis -02/2021 carotid US bilateral 1-39% disease       SH: enjoys working in yard, fishing.  Daughter fairly recently committed suicide Past Medical History:  Diagnosis Date   Anxiety    Anxiety and depression    ASCVD (arteriosclerotic cardiovascular disease)    a. s/p PTCA to LCx in 1993 b. BMS to RCA in 01/2012 with residual 80% OM2 stenosis and medical management recommended c. 08/2019: cath showing patent stents with occluded OM2 --> medical management. d. s/p CABG in 02/2021 with LIMA-LAD, reverse SVG-D1 and SVG-OM   Asthma    CAD (coronary artery disease)    Carcinoma in situ of colon 2004   rectal polyp   Colitis,  ischemic ([place]) 2011   COPD (chronic obstructive pulmonary disease) (HCC)    mild ;excercise induced hypoxemia by cp stress test ;asthma ,bronchitis,   Depression    Diverticulosis    Gastritis 12/30/2010   EGD Dr [name]   GERD (gastroesophageal reflux disease)    GI bleed    H. 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)    Leukocytosis    [name]   Lung nodule 07/16/2011   Myocardial infarction [facility])    age 30   Restless leg syndrome    Schatzki's ring    Sleep apnea    does not use CPAP:cannot tolerate, PCP aware   Syncope    Tobacco abuse    50 pack years continuing at one halp pack daily   Tubular adenoma of colon 06/2009   Colonosocpy Dr [name]     No Known Allergies   Current Outpatient Medications  Medication Sig Dispense Refill   albuterol (VENTOLIN HFA) 108 (90 Base) MCG/ACT inhaler Inhale 2 puffs into the lungs every 4 (four) hours as needed for wheezing  or shortness of breath. 18 g 1   ALPRAZolam (XANAX) 1 MG tablet Take 1 tablet (1 mg total) by mouth 3 (three) times daily. 90 tablet 2   amLODipine (NORVASC) 5 MG tablet TAKE 1 TABLET ONE TIME DAILY 90 tablet 1   aspirin EC 81 MG tablet Take 81 mg by mouth daily.     atorvastatin (LIPITOR) 80 MG tablet TAKE 1 TABLET EVERY DAY 90 tablet 3   Cyanocobalamin (VITAMIN B-12 PO) Take 1 tablet by mouth in the morning.     FLUoxetine (PROZAC) 40 MG capsule Take 1 capsule (40 mg total) by mouth 2 (two) times daily. 180 capsule 2   gabapentin (NEURONTIN) 300 MG capsule TAKE 2 CAPSULES THREE TIMES DAILY 540 capsule 3   HYDROcodone-acetaminophen (NORCO/VICODIN) 5-325 MG tablet Take 1 tablet by mouth 2 (two) times daily as needed for moderate pain. 45 tablet 0   HYDROcodone-acetaminophen (NORCO/VICODIN) 5-325 MG tablet Take 1 tablet by mouth 2 (two) times daily as needed for moderate pain. 45 tablet 0    HYDROcodone-acetaminophen (NORCO/VICODIN) 5-325 MG tablet 1 twice daily as needed pain to last 30 days 45 tablet 0   isosorbide mononitrate (IMDUR) 30 MG 24 hr tablet Take 30 mg by mouth daily.     lipase/protease/amylase (CREON) 36000 UNITS CPEP capsule Take 2 capsules (72,000 Units total) by mouth 3 (three) times daily with meals. May also take 1 capsule (36,000 Units total) as needed (with snacks). (Patient taking differently: Take 2 capsules (72,000 Units total) by mouth 3 (three) times daily with meals.) 240 capsule 3   meloxicam (MOBIC) 15 MG tablet TAKE 1 TABLET EVERY DAY 90 tablet 0   metoprolol succinate (TOPROL-XL) 50 MG 24 hr tablet TAKE 1 TABLET EVERY DAY 90 tablet 3   nitroGLYCERIN (NITROSTAT) 0.4 MG SL tablet Place 1 tablet (0.4 mg total) under the tongue every 5 (five) minutes as needed for chest pain. 20 tablet 5   OLANZapine (ZYPREXA) 10 MG tablet Take 1 tablet (10 mg total) by mouth at bedtime. 90 tablet 2   pantoprazole (PROTONIX) 40 MG tablet TAKE 1 TABLET TWICE DAILY BEFORE MEALS 180 tablet 3   polyethylene glycol-electrolytes (NULYTELY) 420 g solution As directed 4000 mL 0   rOPINIRole (REQUIP) 2 MG tablet TAKE 1 TABLET AT BEDTIME 90 tablet 0   tamsulosin (FLOMAX) 0.4 MG CAPS capsule TAKE 1 CAPSULE EVERY DAY 90 capsule 0   traZODone (DESYREL) 50 MG tablet TAKE 1 TABLET AT BEDTIME 90 tablet 1   TRELEGY ELLIPTA 100-62.5-25 MCG/ACT AEPB INHALE 1 PUFF INTO THE LUNGS DAILY. 180 each 3   No current facility-administered medications for this visit.     Past Surgical History:  Procedure Laterality Date   BIOPSY  12/19/2017   Procedure: BIOPSY;  Surgeon: [name], MD;  [facility];  Service: Endoscopy;;  gastric   BIOPSY  11/05/2021   Procedure: BIOPSY;  Surgeon: [name], MD;  [facility];  Service: Endoscopy;;   CARDIAC CATHETERIZATION     CHOLECYSTECTOMY  2004   COLONOSCOPY  06/2009   normal terminal ileum, segmental mild inflammation of  sigmoid colon (bx unremarkable), polyp, tubular adenoma   COLONOSCOPY N/A 07/31/2013   Dr.Rourk- redundant anal canal hemorrhoids, colonic diverticulosis, tubular adenoma   COLONOSCOPY W/ POLYPECTOMY  2004   rectal polyp with carcinoma in situ removed via colonoscopy   COLONOSCOPY WITH PROPOFOL N/A 09/15/2015   Dr. [name]: Scattered diverticula throughout the colon, 2 sessile polyps found  in the descending colon and cecum, 5 mm in size.  Cecal polyp was sessile serrated polyp, descending colon polyp was a tubular adenoma.  He had a abnormal perianal exam along with grade 3 hemorrhoids.  Surveillance exam recommended for 5-year follow-up.   COLONOSCOPY WITH PROPOFOL N/A 12/19/2017   one 5 mm polyp in ascending colon and 1 cm sessile polyp in ascending s/p removal. Tubular adenoma. internal hemorrhoids   COLONOSCOPY WITH PROPOFOL N/A 11/05/2021   Procedure: COLONOSCOPY WITH PROPOFOL;  Surgeon: [name], MD;  [facility];  Service: Endoscopy;  Laterality: N/A;  12:30pm   CORONARY ANGIOPLASTY WITH STENT PLACEMENT  01/26/2012   "1; total is now 2"   CORONARY ARTERY BYPASS GRAFT N/A 03/13/2021   Procedure: CORONARY ARTERY BYPASS GRAFTING (CABG), ON PUMP, TIMES THREE, USING LEFT INTERNAL MAMMARY ARTERY AND RIGHT ENDOSCOPICALLY HARVESTED GREATER SAPHENOUS VEIN;  Surgeon: Lajuana Matte, MD;  Location: [place];  Service: Open Heart Surgery;  Laterality: N/A;   CORONARY/GRAFT ACUTE MI REVASCULARIZATION N/A 03/10/2021   Procedure: Coronary/Graft Acute MI Revascularization;  Surgeon: Belva Crome, MD;  Location: [place] [facility];  Service: Cardiovascular;  Laterality: N/A;   ENDOVEIN HARVEST OF GREATER SAPHENOUS VEIN Right 03/13/2021   Procedure: ENDOVEIN HARVEST OF GREATER SAPHENOUS VEIN;  Surgeon: Lajuana Matte, MD;  Location: [place];  Service: Open Heart Surgery;  Laterality: Right;   ESOPHAGOGASTRODUODENOSCOPY  02/2009   query Barrett's but bx negative   ESOPHAGOGASTRODUODENOSCOPY   12/30/2010   [name],gastritis, dilated 42F, sm HH, 1 small ulcer, Duodenal erosions, benign bx   ESOPHAGOGASTRODUODENOSCOPY (EGD) WITH ESOPHAGEAL DILATION N/A 09/12/2012   [id number] Schatzki's ring s/p Maloney dilator. Small hiatal hernia. negative path   ESOPHAGOGASTRODUODENOSCOPY (EGD) WITH ESOPHAGEAL DILATION N/A 07/31/2013   Dr. [name]- normal egd, s/p [facility] dilation empirically. Normal small bowel biopsies    ESOPHAGOGASTRODUODENOSCOPY (EGD) WITH PROPOFOL N/A 09/15/2015   Dr. [name]: Medium sized hiatal hernia, normal-appearing esophagus status post empiric dilation   ESOPHAGOGASTRODUODENOSCOPY (EGD) WITH PROPOFOL N/A 12/19/2017   erosive esophagitis s/p dilation, erosive gastropathy, normal duodenum   ESOPHAGOGASTRODUODENOSCOPY (EGD) WITH PROPOFOL N/A 09/17/2019   Non-obstructing Schatzki's ring s/p dilation, medium-sized hiatal hernia, otherwise normal.   ESOPHAGOGASTRODUODENOSCOPY (EGD) WITH PROPOFOL N/A 11/05/2021   Procedure: ESOPHAGOGASTRODUODENOSCOPY (EGD) WITH PROPOFOL;  Surgeon: [name], MD;  [facility];  Service: Endoscopy;  Laterality: N/A;   FLEXIBLE SIGMOIDOSCOPY  12/30/2010    [name],; internal hemorrhoids, anal papilla   HAND SURGERY     surgical intervention for injury of the fingers of the left hand many years ago   heart stent     HEMORRHOID BANDING     Dr. [name]   LEFT HEART CATH AND CORONARY ANGIOGRAPHY N/A 08/29/2019   Procedure: LEFT HEART CATH AND CORONARY ANGIOGRAPHY;  Surgeon: [name], MD;  Location: [place] [facility];  Service: Cardiovascular;  Laterality: N/A;   LEFT HEART CATH AND CORONARY ANGIOGRAPHY N/A 03/10/2021   Procedure: LEFT HEART CATH AND CORONARY ANGIOGRAPHY;  Surgeon: Belva Crome, MD;  Location: [place] [facility];  Service: Cardiovascular;  Laterality: N/A;   LEFT HEART CATHETERIZATION WITH CORONARY ANGIOGRAM N/A 01/26/2012   Procedure: LEFT HEART CATHETERIZATION WITH CORONARY ANGIOGRAM;  Surgeon: [name], MD;  Location: [facility];  Service: Cardiovascular;  Laterality: N/A;   MALONEY DILATION N/A 09/15/2015   Procedure: [name] DILATION;  Surgeon: [name], MD;  [facility];  Service:  Endoscopy;  Laterality: N/A;   MALONEY DILATION N/A 12/19/2017   Procedure: [name] DILATION;  Surgeon: [name], MD;  [facility];  Service: Endoscopy;  Laterality: N/A;   MALONEY DILATION N/A 09/17/2019   Procedure: [name] DILATION;  Surgeon: [name], MD;  [facility];  Service: Endoscopy;  Laterality: N/A;   MALONEY DILATION N/A 11/05/2021   Procedure: [name] DILATION;  Surgeon: [name], MD;  [facility];  Service: Endoscopy;  Laterality: N/A;   NASAL SEPTOPLASTY W/ TURBINOPLASTY  10/06/2011   Procedure: NASAL SEPTOPLASTY WITH TURBINATE REDUCTION;  Surgeon: Izora Gala, MD;  Location: [place];  Service: ENT;  Laterality: Bilateral;   PERCUTANEOUS CORONARY STENT INTERVENTION (PCI-S) N/A 01/26/2012   Procedure: PERCUTANEOUS CORONARY STENT INTERVENTION (PCI-S);  Surgeon: [name], MD;  Location: [facility] C[facility] LAB;  Service: Cardiovascular;  Laterality: N/A;   POLYPECTOMY  09/15/2015   Procedure: POLYPECTOMY;  Surgeon: [name], MD;  [facility];  Service: Endoscopy;;  Cecal polyp removed via cold snare/ Descending colon polyp removed via cold snare   POLYPECTOMY  12/19/2017   Procedure: POLYPECTOMY;  Surgeon: [name], MD;  [facility];  Service: Endoscopy;;  colon   TEE WITHOUT CARDIOVERSION N/A 03/13/2021   Procedure: TRANSESOPHAGEAL ECHOCARDIOGRAM (TEE);  Surgeon: Lajuana Matte, MD;  Location: [place];  Service: Open Heart Surgery;  Laterality: N/A;     No Known Allergies    Family History  Problem Relation Age of Onset   Lung disease Father        deceased, black lung   Heart disease Mother        blood clots   Depression Mother    Cancer Paternal Uncle        unknown type   Cancer  Maternal Aunt        unknown type   Kidney failure Maternal Uncle    Hypertension Brother    Colon cancer Neg Hx    ADD / ADHD Neg Hx    Alcohol abuse Neg Hx    Drug abuse Neg Hx    Anxiety disorder Neg Hx    Bipolar disorder Neg Hx    Dementia Neg Hx    OCD Neg Hx    Paranoid behavior Neg Hx    Schizophrenia Neg Hx    Physical abuse Neg Hx    Sexual abuse Neg Hx    Seizures Neg Hx      Social History Mr. Cotrone reports that he has been smoking cigarettes. He started smoking about 53 years ago. He has a 48.00 pack-year smoking history. He has quit using smokeless tobacco. Mr. Liva reports current alcohol use.   Review of Systems CONSTITUTIONAL: No weight loss, fever, chills, weakness or fatigue.  HEENT: Eyes: No visual loss, blurred vision, double vision or yellow sclerae.No hearing loss, sneezing, congestion, runny nose or sore throat.  SKIN: No rash or itching.  CARDIOVASCULAR: per hpi RESPIRATORY: No shortness of breath, cough or sputum.  GASTROINTESTINAL: No anorexia, nausea, vomiting or diarrhea. No abdominal pain or blood.  GENITOURINARY: No burning on urination, no polyuria NEUROLOGICAL: No headache, dizziness, syncope, paralysis, ataxia, numbness or tingling in the extremities. No change in bowel or bladder control.  MUSCULOSKELETAL: No muscle, back pain, joint pain or stiffness.  LYMPHATICS: No enlarged nodes. No history of splenectomy.  PSYCHIATRIC: No history of depression or anxiety.  ENDOCRINOLOGIC: No reports of sweating, cold or heat intolerance. No  polyuria or polydipsia.  Marland Kitchen   Physical Examination Today's Vitals   09/15/22 1411  BP: 114/72  Pulse: 62  SpO2: 95%  Weight: 155 lb 6.4 oz (70.5 kg)  Height: 5\' 11"  (1.803 m)   Body mass index is 21.67 kg/m.  Gen: resting comfortably, no acute distress HEENT: no scleral icterus, pupils equal round and reactive, no palptable cervical adenopathy,  CV: RRR, no m/r,g no jvd Resp: Clear to auscultation  bilaterally GI: abdomen is soft, non-tender, non-distended, normal bowel sounds, no hepatosplenomegaly MSK: extremities are warm, no edema.  Skin: warm, no rash Neuro:  no focal deficits Psych: appropriate affect   Diagnostic Studies  02/2010 Cath DESCRIPTION OF PROCEDURE:  The risks and indication were explained. Consent was signed and placed on the chart.  A 4-French arterial sheath was placed in the right femoral artery using a modified Seldinger technique.  A standard catheters including a JL-4, 3DRC, and angled pigtail were used.  All catheter exchanges were made over wire.  There were no apparent complications.  Central aortic pressure 109/67 with a mean of 86.  LV pressure 110/70 with an EDP of 28.  There was no aortic stenosis.   Left main was normal.   LAD was a long vessel coursing to the apex, it gave off a moderate-sized diagonal Yasamin Karel.  There was a 40% lesion in the proximal LAD, otherwise minimal luminal irregularities.   Left circumflex was a large dominant vessel, gave off a narrow caliber ramus, large OM-1, and narrow caliber OM-2 in several posterolaterals. In the proximal left circumflex, he had a 40% lesion which extended into the proximal portion of the OM-1.  There was a 30% lesion in the mid OM- 1.  In the OM-2, there was a long 80% stenosis throughout the ostial and proximal portion.  This vessel was very small caliber vessel.   Right coronary artery was a nondominant vessel, gave off an RV Jaamal Farooqui. There was approximately 90% stenosis in the proximal portion.  Once again, this was a very small nondominant vessel.   Left ventriculogram done in the RAO position showed an EF of 50-55% with inferior basilar akinesis.  There was no significant mitral regurgitation.   Abdominal aortogram showed 40% ostial lesion in the right renal artery. The left renal artery was widely patent.  There was no abdominal aortic aneurysm.  On panning down over the iliac and  femoral system, there was no high-grade lesions.   ASSESSMENT: 1. Stable coronary artery disease with high-grade lesions in the small     obtuse marginal 2 and right coronary artery both of which are too     small for percutaneous intervention. 2. Left ventricular ejection fraction was 50-55% with mildly elevated     filling pressures. 3. Very mild right renal artery stenosis. 4. No obvious high-grade proximal peripheral arterial disease.   PLAN/DISCUSSION:  I suspect most of his dyspnea is likely due to his lung disease.  I have urged him to stop smoking.  We will continue medical therapy now for his heart disease.     08/2011 Echo Study Conclusions  - Left ventricle: The cavity size was normal. There was mild focal basal hypertrophy of the septum. The estimated ejection fraction was 55%. There is akinesis of the basalinferior myocardium. Doppler parameters are consistent with abnormal left ventricular relaxation (grade 1 diastolic dysfunction). - Mitral valve: Trivial regurgitation. - Left atrium: The atrium was at the upper limits of normal in size. - Tricuspid valve: Trivial  regurgitation. - Pulmonary arteries: PA peak pressure: 85mm Hg (S). - Pericardium, extracardiac: There was no pericardial effusion.   09/2015 ABIs: 1.2 bilaterally     03/2016 echo Study Conclusions   - Left ventricle: The cavity size was normal. Wall thickness was   normal. The estimated ejection fraction was 50%. There is   akinesis of the basal-midinferolateral and inferior myocardium.   Left ventricular diastolic function parameters were normal. - Aortic valve: Mildly calcified annulus. Trileaflet. - Mitral valve: Mildly thickened leaflets . There was trivial   regurgitation. - Left atrium: The atrium was at the upper limits of normal in   size. - Right atrium: Central venous pressure (est): 3 mm Hg. - Atrial septum: No defect or patent foramen ovale was identified. - Tricuspid valve:  There was trivial regurgitation. - Pulmonary arteries: PA peak pressure: 9 mm Hg (S). - Pericardium, extracardiac: There was no pericardial effusion.   Impressions:   - Normal LV wall thickness with LVEF approximately 50%. There is   akinesis of the mid to basal inferolateral/inferior wall. Normal   diastolic function. Upper normal left atrial chamber size. Mildly   thickened mitral leaflets with trivial mitral regurgitation.   Mildly sclerotic aortic valve. Trivial tricuspid regurgitation.   08/2017 nuclear stress There was no ST segment deviation noted during stress. Findings consistent with large prior inferior/inferolateral myocardial infarction. This is an intermediate risk study. Risk based on decreased LVEF, there is no current myocardium at jeopardy. The left ventricular ejection fraction is mildly decreased (45%).       02/2021 cath CONCLUSIONS: Acute coronary syndrome with ostial 99% of circumflex progression.  First marginal 70%. 40 to 50% distal left main 40 to 50% mid LAD with 40% first diagonal Right coronary with diffuse 30 to 40% in-stent restenosis proximal and eccentric 50 to 60% mid to distal stenosis. LV systolic dysfunction with regional wall motion abnormality, and elevated LVEDP of 22 mmHg.  Findings are consistent with acute on chronic combined systolic and diastolic heart failure.   RECOMMENDATIONS:   IV Aggrastat IV heparin IV nitro Notify Dr. Kipp Brood of patient's presence and requested consideration of surgical revascularization.  Could also discuss other treatment options which could include circumflex stent with extension into the left main however it would gel the LAD.  Left main stenting would not be an optimal long-term strategy for this patient given his age of 62. Was not loaded with P2 Y 12 agent.   Assessment and Plan   1. CAD - no cardiac symptoms, chronic mild MSK pain at sternotomy site - continue current meds   2.  Hyperlipidemia -continue statin   3. HTN -at goal, appears to be off valsartan. Would suspect some low bp's given weight loss  4. Weight loss - down almost 30 lbs since Nov, has appt with pcp in next 2 weeks   F/u 6 months     Arnoldo Lenis, M.D.

## 2022-09-24 ENCOUNTER — Encounter: Payer: Self-pay | Admitting: *Deleted

## 2022-09-27 IMAGING — DX DG CHEST 2V
2 series · 2 of 2 positions shown · non-contrast
Comparison: Chest radiograph 03/19/2021

CLINICAL DATA: s/p cabg

EXAM:
CHEST - 2 VIEW

[dg chest 2 view (1 of 2)]
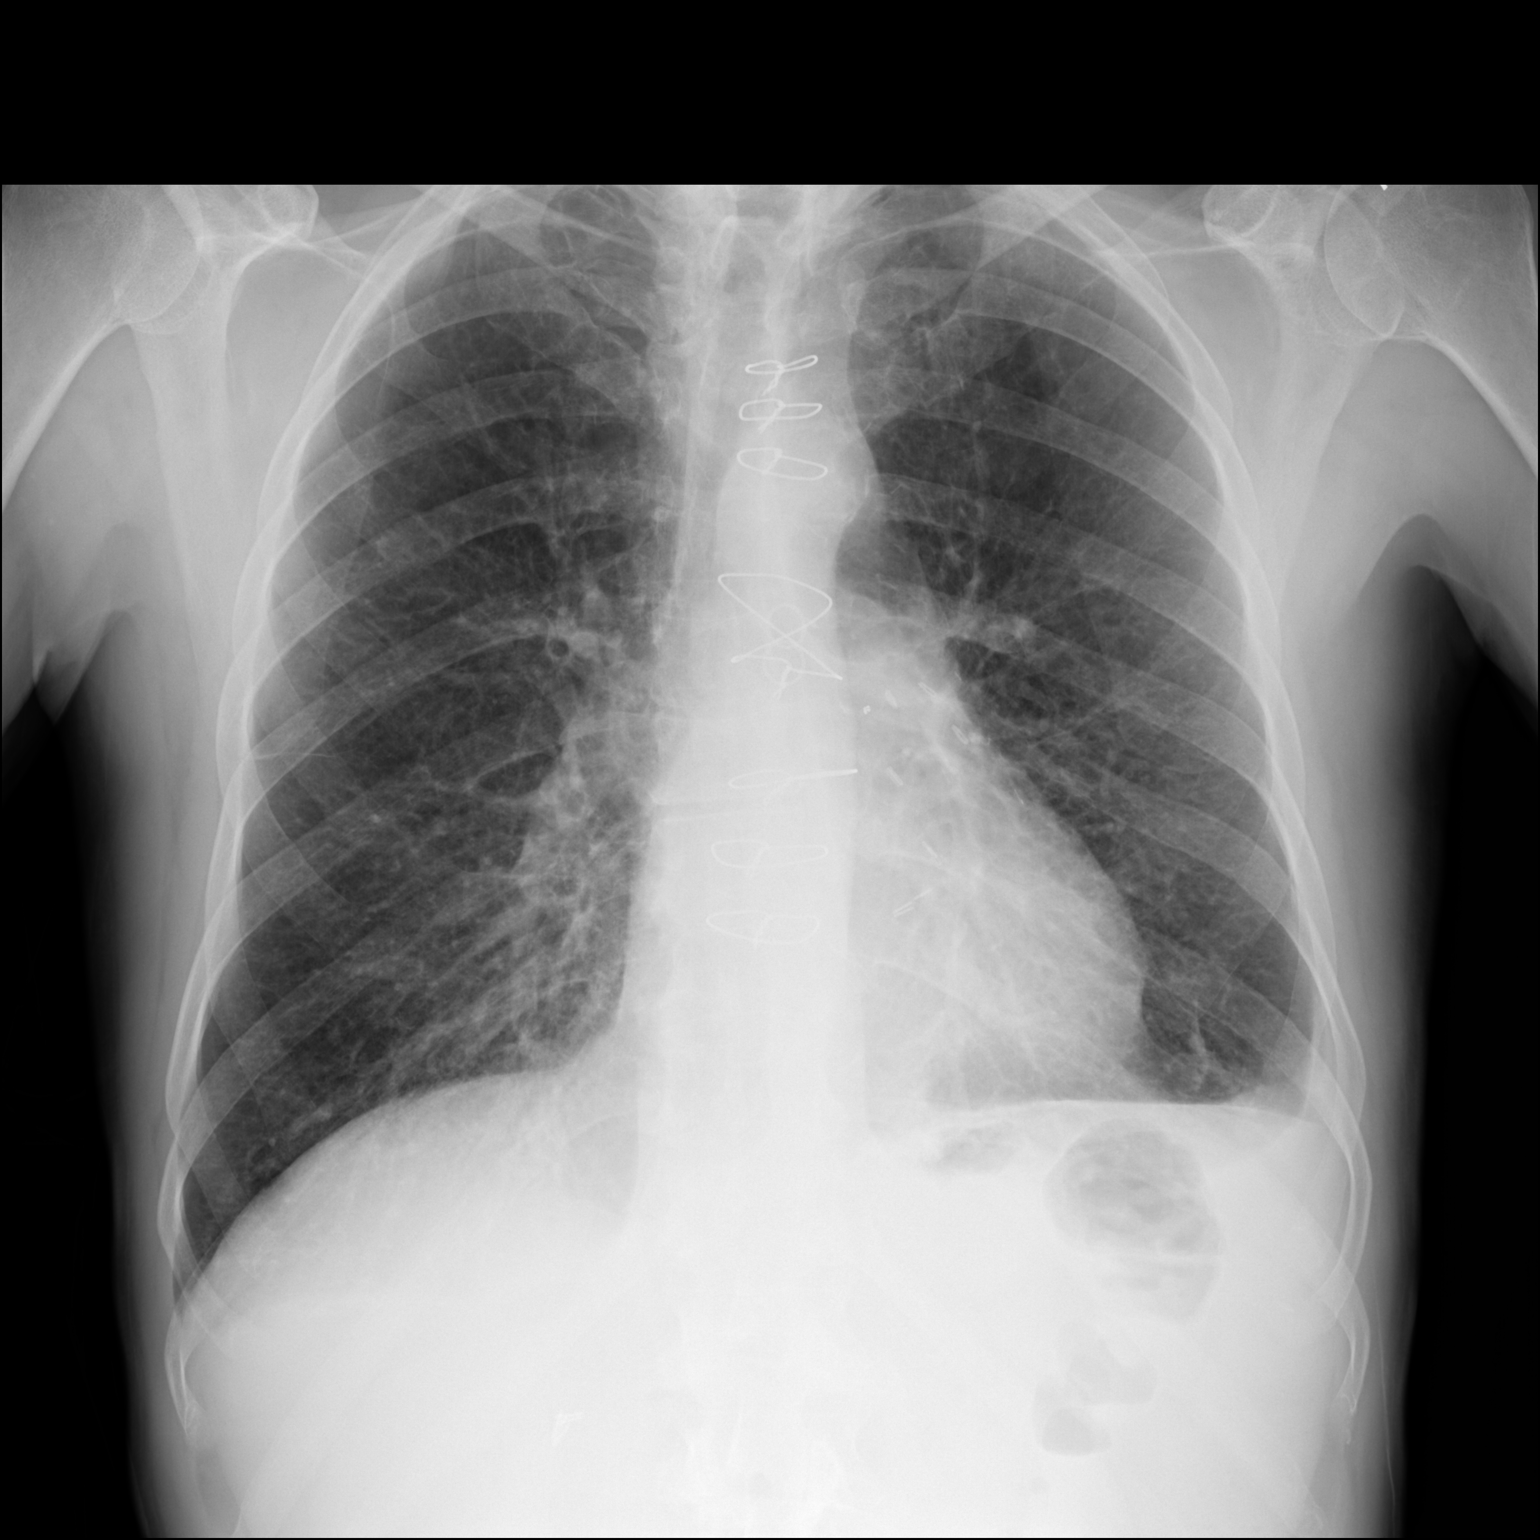

[dg chest 2 view (2 of 2)]
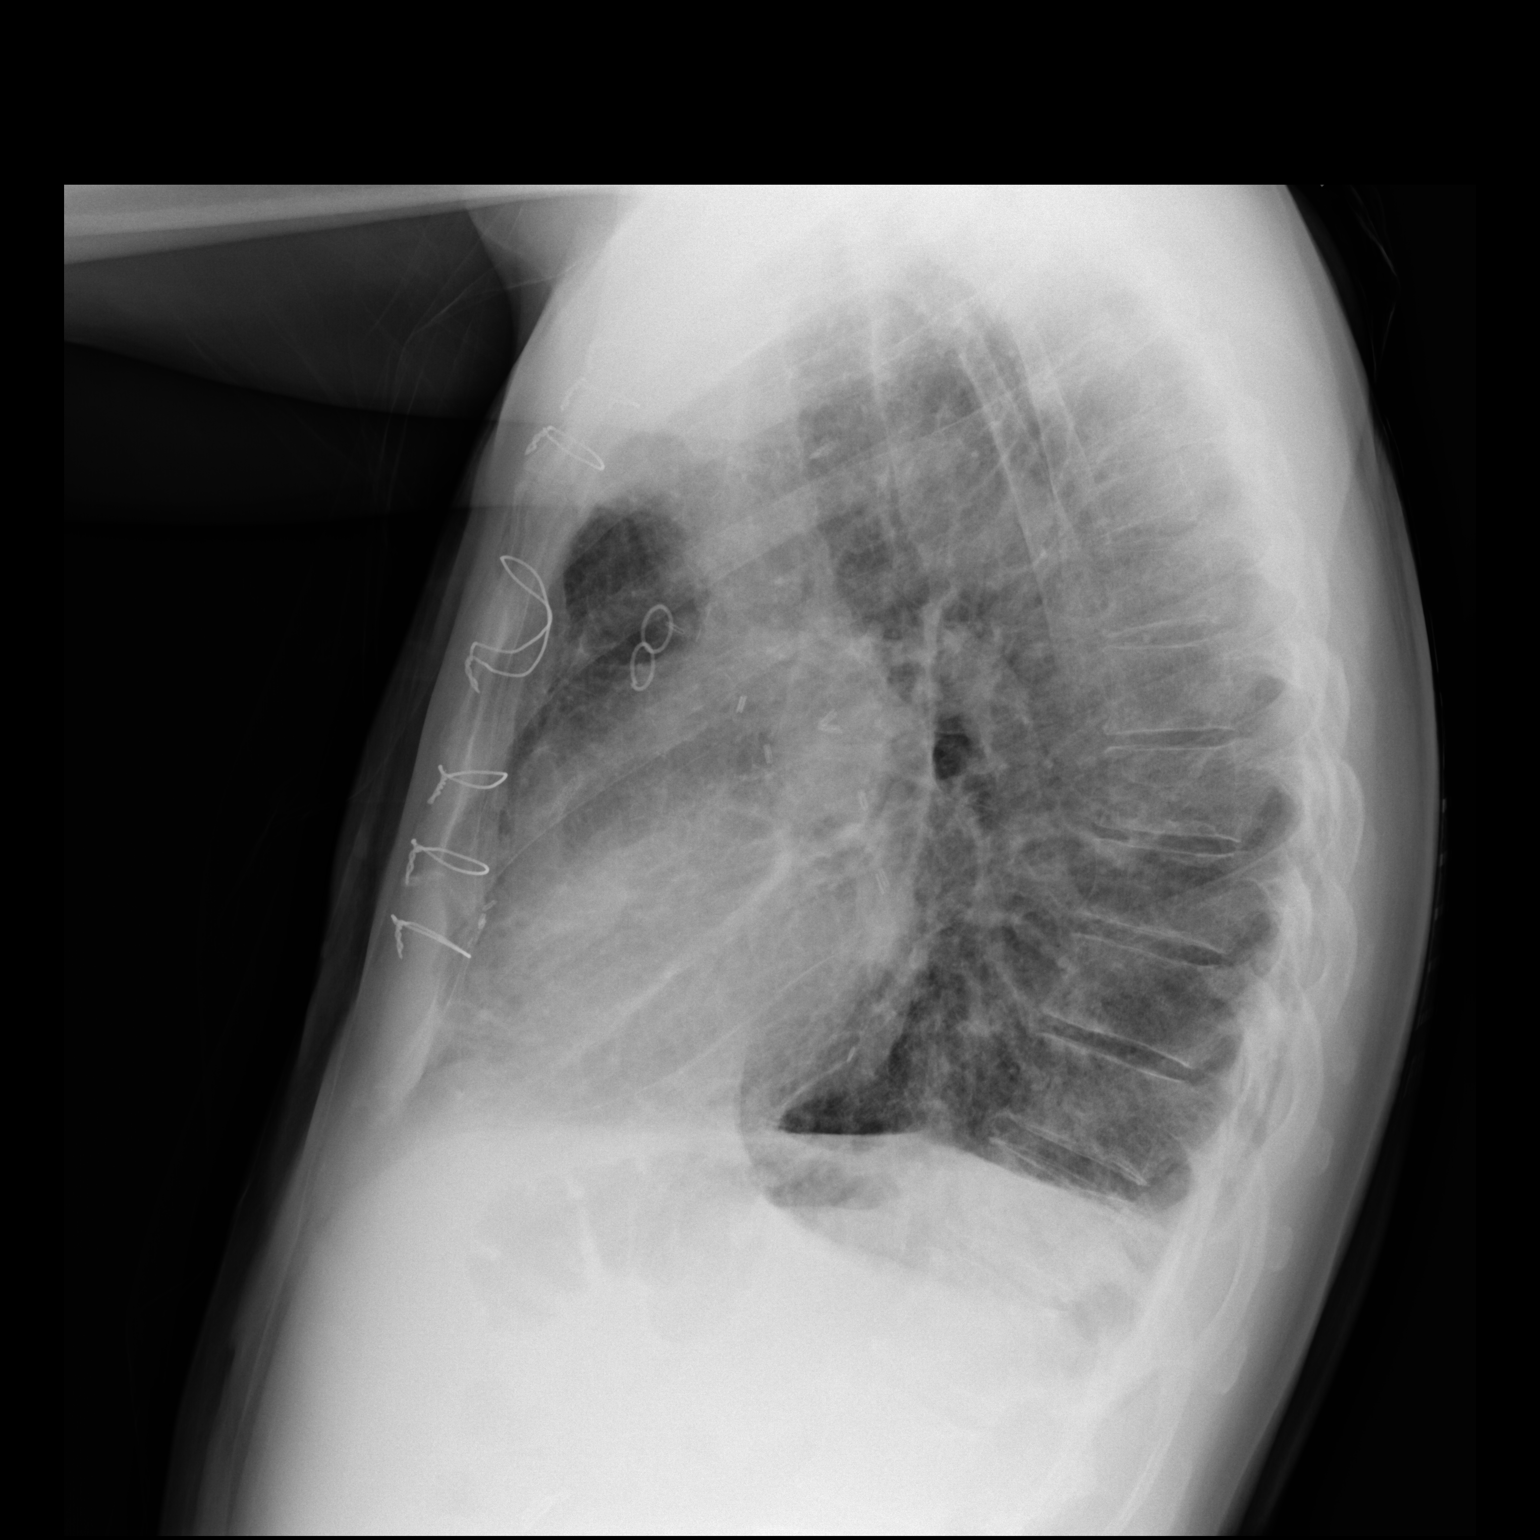

[2 of 2 positions shown; findings below may reference images not displayed]

FINDINGS: Unchanged cardiomediastinal silhouette. Prior CABG. Intact
sternotomy wires. There is a new small left pleural effusion and
left basilar atelectasis. There is no visible pneumothorax. There is
no acute osseous abnormality.
IMPRESSION: Postsurgical changes of CABG. New small left pleural effusion with
adjacent basilar atelectasis.

## 2022-09-28 ENCOUNTER — Ambulatory Visit: Payer: Medicare HMO | Admitting: Family Medicine

## 2022-10-08 ENCOUNTER — Other Ambulatory Visit: Payer: Self-pay | Admitting: Family Medicine

## 2022-10-15 ENCOUNTER — Other Ambulatory Visit (HOSPITAL_COMMUNITY): Payer: Self-pay | Admitting: Psychiatry

## 2022-10-15 ENCOUNTER — Other Ambulatory Visit: Payer: Self-pay | Admitting: Family Medicine

## 2022-10-18 ENCOUNTER — Ambulatory Visit: Payer: Medicare HMO | Admitting: Family Medicine

## 2022-11-09 ENCOUNTER — Other Ambulatory Visit (HOSPITAL_COMMUNITY): Payer: Self-pay | Admitting: Psychiatry

## 2022-11-09 ENCOUNTER — Other Ambulatory Visit: Payer: Self-pay | Admitting: Family Medicine

## 2022-11-10 ENCOUNTER — Ambulatory Visit (HOSPITAL_COMMUNITY): Payer: Medicare HMO | Admitting: Psychiatry

## 2022-11-13 ENCOUNTER — Other Ambulatory Visit: Payer: Self-pay | Admitting: Family Medicine

## 2022-11-18 ENCOUNTER — Telehealth: Payer: Self-pay | Admitting: Family Medicine

## 2022-11-18 ENCOUNTER — Other Ambulatory Visit: Payer: Self-pay | Admitting: Family Medicine

## 2022-11-18 NOTE — Telephone Encounter (Signed)
Patient's wife called in requesting a refill on hydrocodone called into [facility] in [place].  CB# [phone]

## 2022-11-18 NOTE — Telephone Encounter (Signed)
1.  This is a controlled medicine 2.  He has not been seen since January 3.  Need to follow state guidelines patient has to be seen every 3 months in order to prescribe the medicine so therefore I just cannot send in a refill Please schedule office visit preferably within the next 2 weeks with me so we can resume his prescriptions thank you

## 2022-11-18 NOTE — Telephone Encounter (Signed)
Patient notified and scheduled office visit 11/22/22 at 3:30 pm

## 2022-11-22 ENCOUNTER — Ambulatory Visit (INDEPENDENT_AMBULATORY_CARE_PROVIDER_SITE_OTHER): Payer: Medicare HMO | Admitting: Family Medicine

## 2022-11-22 VITALS — BP 119/85 | HR 75 | Ht 71.0 in | Wt 152.6 lb

## 2022-11-22 DIAGNOSIS — J449 Chronic obstructive pulmonary disease, unspecified: Secondary | ICD-10-CM

## 2022-11-22 DIAGNOSIS — R7303 Prediabetes: Secondary | ICD-10-CM

## 2022-11-22 DIAGNOSIS — I10 Essential (primary) hypertension: Secondary | ICD-10-CM

## 2022-11-22 DIAGNOSIS — E782 Mixed hyperlipidemia: Secondary | ICD-10-CM | POA: Diagnosis not present

## 2022-11-22 DIAGNOSIS — I25118 Atherosclerotic heart disease of native coronary artery with other forms of angina pectoris: Secondary | ICD-10-CM

## 2022-11-22 DIAGNOSIS — R7401 Elevation of levels of liver transaminase levels: Secondary | ICD-10-CM

## 2022-11-22 DIAGNOSIS — G894 Chronic pain syndrome: Secondary | ICD-10-CM | POA: Diagnosis not present

## 2022-11-22 DIAGNOSIS — Z720 Tobacco use: Secondary | ICD-10-CM | POA: Diagnosis not present

## 2022-11-22 DIAGNOSIS — Z122 Encounter for screening for malignant neoplasm of respiratory organs: Secondary | ICD-10-CM

## 2022-11-22 MED ORDER — HYDROCODONE-ACETAMINOPHEN 5-325 MG PO TABS: 1.0000 | ORAL_TABLET | Freq: Two times a day (BID) | ORAL | 0 refills | Status: DC | PRN

## 2022-11-22 MED ORDER — HYDROCODONE-ACETAMINOPHEN 5-325 MG PO TABS: ORAL_TABLET | ORAL | 0 refills | Status: DC

## 2022-11-22 NOTE — Progress Notes (Signed)
Subjective:    Patient ID: [name], male    DOB: 1955-08-10, 67 y.o.   MRN: [id number]  HPI This patient was seen today for chronic pain  The medication list was reviewed and updated.   Location of Pain for which the patient has been treated with regarding narcotics: Patient has pain in his lower back as well as knees and lower legs, Tylenol alone does not do well enough NSAIDs contraindicated with him being on aspirin He uses hydrocodone 1 or 2 tablets a day 45 tablets lasts each month   patient denies this causing drowsiness with his alprazolam-he states he is only utilizing 2 alprazolam per day he was encouraged to talk with his psychiatrist about tapering down   Onset of this pain: Present for years   -Compliance with medication: Good compliance  - Number patient states they take daily: 1 or 2 tablets daily  -Reason for ongoing use of opioids please see discussion above  What other measures have been tried outside of opioids Tylenol, NSAIDs contraindicated  In the ongoing specialists regarding this condition none  -when was the last dose patient took? Last week Tuesday.  The patient was advised the importance of maintaining medication and not using illegal substances with these.  Here for refills and follow up  The patient was educated that we can provide 3 monthly scripts for their medication, it is their responsibility to follow the instructions.  Side effects or complications from medications: None  Patient is aware that pain medications are meant to minimize the severity of the pain to allow their pain levels to improve to allow for better function. They are aware of that pain medications cannot totally remove their pain.  Due for UDT ( at least once per year) (pain management contract is also completed at the time of the UDT): 01/20/2022  Scale of 1 to 10 ( 1 is least 10 is most) Your pain level without the medicine: 7 Your pain level with medication  4  Scale 1 to 10 ( 1-helps very little, 10 helps very well) How well does your pain medication reduce your pain so you can function better through out the day? 7  Quality of the pain: Throbbing aching  Persistence of the pain: Comes and goes  Modifying factors: Worse with activity  Essential hypertension - Plan: Basic Metabolic Panel, Microalbumin / creatinine urine ratio  Stage 2 moderate COPD by GOLD classification (HCC)  Coronary artery disease involving native coronary artery of native heart with other form of angina pectoris (HCC)  Chronic pain syndrome  Prediabetes  Mixed hyperlipidemia - Plan: Lipid panel  Elevated transaminase level - Plan: Hepatic function panel  Patient does take his blood pressure medicine regular basis tries to watch diet is due for lab work Patient does smoke he has been encouraged to quit he does use his inhalers Prediabetes he does try to be careful with starches in his diet and stay physically active Takes his cholesterol medicine       Review of Systems     Objective:   Physical Exam General-in no acute distress Eyes-no discharge Lungs-respiratory rate normal, CTA CV-no murmurs,RRR Extremities skin warm dry no edema Neuro grossly normal Behavior normal, alert Extremities no edema       Assessment & Plan:   1. Essential hypertension Blood pressure decent control continue current measures - Basic Metabolic Panel - Microalbumin / creatinine urine ratio  2. Stage 2 moderate COPD by GOLD classification [facility]) Patient  encouraged to quit smoking he does agreed to do the CT scan for lung cancer  3. Coronary artery disease involving native coronary artery of native heart with other form of angina pectoris [facility]) He denies any bad chest pains currently he was encouraged to quit smoking stick with his medicines  4. Chronic pain syndrome Does chronic pain visits every 3 months is on a pain management contract as urine drug screen on  a yearly basis The patient was seen in followup for chronic pain. A review over at their current pain status was discussed. Drug registry was checked. Prescriptions were given.  Regular follow-up recommended. Discussion was held regarding the importance of compliance with medication as well as pain medication contract.  Patient was informed that medication may cause drowsiness and should not be combined  with other medications/alcohol or street drugs. If the patient feels medication is causing altered alertness then do not drive or operate dangerous equipment.  Should be noted that the patient appears to be meeting appropriate use of opioids and response.  Evidenced by improved function and decent pain control without significant side effects and no evidence of overt aberrancy issues.  Upon discussion with the patient today they understand that opioid therapy is optional and they feel that the pain has been refractory to reasonable conservative measures and is significant and affecting quality of life enough to warrant ongoing therapy and wishes to continue opioids.  Refills were provided.   5. Prediabetes Starch control regular activity  6. Mixed hyperlipidemia Continue cholesterol medicine - Lipid panel  7. Elevated transaminase level Check labs - Hepatic function panel

## 2022-11-24 ENCOUNTER — Other Ambulatory Visit: Payer: Self-pay | Admitting: Family Medicine

## 2022-12-01 NOTE — Addendum Note (Signed)
Addended by: Margaretha Sheffield on: 12/01/2022 08:40 AM   Modules accepted: Orders

## 2023-01-26 ENCOUNTER — Other Ambulatory Visit: Payer: Self-pay | Admitting: Family Medicine

## 2023-02-01 ENCOUNTER — Encounter: Payer: Self-pay | Admitting: Pharmacist

## 2023-02-01 NOTE — Progress Notes (Signed)
Pharmacy Quality Measure Review  This patient is appearing on a report for being at risk of failing the adherence measure for hypertension (ACEi/ARB) medications this calendar year.   Medication: valsartan 80 mg daily Last fill date: 12/30/22 for 90 day supply  Insurance report was not up to date. No action needed at this time.   Adam Phenix, PharmD PGY-1 Pharmacy Resident

## 2023-02-05 ENCOUNTER — Other Ambulatory Visit (HOSPITAL_COMMUNITY): Payer: Self-pay | Admitting: Psychiatry

## 2023-02-05 MED ORDER — OLANZAPINE 10 MG PO TABS: 10.0000 mg | ORAL_TABLET | Freq: Every day | ORAL | 3 refills | Status: DC

## 2023-02-17 ENCOUNTER — Other Ambulatory Visit (HOSPITAL_COMMUNITY): Payer: Self-pay | Admitting: Psychiatry

## 2023-02-18 ENCOUNTER — Other Ambulatory Visit: Payer: Self-pay | Admitting: Family Medicine

## 2023-02-18 NOTE — Telephone Encounter (Signed)
Called pt number is not working.

## 2023-02-22 ENCOUNTER — Ambulatory Visit: Payer: Medicare HMO | Admitting: Family Medicine

## 2023-02-22 ENCOUNTER — Other Ambulatory Visit: Payer: Self-pay | Admitting: Family Medicine

## 2023-02-23 ENCOUNTER — Encounter: Payer: Self-pay | Admitting: *Deleted

## 2023-02-28 ENCOUNTER — Other Ambulatory Visit (HOSPITAL_COMMUNITY): Payer: Self-pay | Admitting: Psychiatry

## 2023-02-28 NOTE — Telephone Encounter (Signed)
Call for appt

## 2023-03-09 ENCOUNTER — Telehealth (HOSPITAL_COMMUNITY): Payer: Self-pay | Admitting: *Deleted

## 2023-03-09 ENCOUNTER — Other Ambulatory Visit (HOSPITAL_COMMUNITY): Payer: Self-pay | Admitting: Psychiatry

## 2023-03-09 MED ORDER — ALPRAZOLAM 1 MG PO TABS: 1.0000 mg | ORAL_TABLET | Freq: Three times a day (TID) | ORAL | 0 refills | Status: DC

## 2023-03-09 NOTE — Telephone Encounter (Signed)
Patient Refill Request --  ALPRAZolam (XANAX) 1 MG tablet

## 2023-03-10 NOTE — Telephone Encounter (Signed)
Spoke with pt scheduled appt 03/24/23

## 2023-03-18 ENCOUNTER — Ambulatory Visit (HOSPITAL_COMMUNITY)
Admission: RE | Admit: 2023-03-18 | Discharge: 2023-03-18 | Disposition: A | Discharge status: Home / Self Care | Payer: Medicare HMO | Source: Ambulatory Visit | Attending: Family Medicine | Admitting: Family Medicine

## 2023-03-18 ENCOUNTER — Ambulatory Visit (INDEPENDENT_AMBULATORY_CARE_PROVIDER_SITE_OTHER): Payer: Medicare HMO | Admitting: Family Medicine

## 2023-03-18 VITALS — BP 134/78 | HR 58 | Temp 98.4°F | Wt 159.0 lb

## 2023-03-18 DIAGNOSIS — Z79891 Long term (current) use of opiate analgesic: Secondary | ICD-10-CM

## 2023-03-18 DIAGNOSIS — Z23 Encounter for immunization: Secondary | ICD-10-CM | POA: Diagnosis not present

## 2023-03-18 DIAGNOSIS — G894 Chronic pain syndrome: Secondary | ICD-10-CM

## 2023-03-18 DIAGNOSIS — R0609 Other forms of dyspnea: Secondary | ICD-10-CM | POA: Insufficient documentation

## 2023-03-18 DIAGNOSIS — I10 Essential (primary) hypertension: Secondary | ICD-10-CM

## 2023-03-18 DIAGNOSIS — K625 Hemorrhage of anus and rectum: Secondary | ICD-10-CM

## 2023-03-18 DIAGNOSIS — J40 Bronchitis, not specified as acute or chronic: Secondary | ICD-10-CM | POA: Diagnosis not present

## 2023-03-18 DIAGNOSIS — J449 Chronic obstructive pulmonary disease, unspecified: Secondary | ICD-10-CM | POA: Diagnosis not present

## 2023-03-18 DIAGNOSIS — J439 Emphysema, unspecified: Secondary | ICD-10-CM | POA: Diagnosis not present

## 2023-03-18 LAB — POCT HEMOGLOBIN: Hemoglobin: 14.2 g/dL (ref 11–14.6)

## 2023-03-18 MED ORDER — DOXYCYCLINE HYCLATE 100 MG PO TABS: 100.0000 mg | ORAL_TABLET | Freq: Two times a day (BID) | ORAL | 0 refills | Status: DC

## 2023-03-18 MED ORDER — HYDROCODONE-ACETAMINOPHEN 5-325 MG PO TABS: ORAL_TABLET | ORAL | 0 refills | Status: DC

## 2023-03-18 MED ORDER — HYDROCODONE-ACETAMINOPHEN 5-325 MG PO TABS: 1.0000 | ORAL_TABLET | Freq: Two times a day (BID) | ORAL | 0 refills | Status: DC | PRN

## 2023-03-18 MED ORDER — MUPIROCIN 2 % EX OINT: TOPICAL_OINTMENT | CUTANEOUS | 2 refills | Status: DC

## 2023-03-18 NOTE — Progress Notes (Signed)
Subjective:    Patient ID: [name], male    DOB: February 08, 1956, 67 y.o.   MRN: [id number]  Discussed the use of AI scribe software for clinical note transcription with the patient, who gave verbal consent to proceed.  History of Present Illness   The patient, with a history of chronic obstructive pulmonary disease (COPD), chronic pain, and psychiatric illness, presents with multiple complaints. The primary concern is a significant decrease in energy, leading to difficulty in performing daily activities. He reports having to rest frequently due to fatigue.  He also reports difficulty in eating due to dental issues and a lack of appetite.  His COPD symptoms have worsened over the past three weeks following a cold. He reports coughing up green phlegm and experiencing difficulty breathing, particularly at night. He uses an inhaler to manage these symptoms, which provides some relief.  He also reports a new onset of rectal bleeding that started five days ago. The bleeding is significant, with him describing the entire stool as red. There is no associated pain or changes in bowel habits.  His chronic pain is primarily located in the legs, with some discomfort in the back. The pain is managed with hydrocodone, which he reports as effective in reducing the pain from a level of six or seven to a more manageable level.  He also reports a recent injury to the left knee after a fall, which occurred while trying to escape a swarm of bees. The knee wound has become infected and is causing significant discomfort.  He has been out of his prescribed medications, including gabapentin and alprazolam.  His blood pressure and cholesterol are well-managed on current medications. He is a smoker, consuming approximately half a pack of cigarettes per day, and has expressed a desire to quit.       This patient was seen today for chronic pain  The medication list was reviewed and updated.   Location of Pain  for which the patient has been treated with regarding narcotics: Back into the legs  Onset of this pain: Present for years   -Compliance with medication: Good compliance  - Number patient states they take daily: 1 or 2 daily  -Reason for ongoing use of opioids persistent pain  What other measures have been tried outside of opioids NSAIDs, Tylenol  In the ongoing specialists regarding this condition none currently  -when was the last dose patient took?  Earlier today  The patient was advised the importance of maintaining medication and not using illegal substances with these.  Here for refills and follow up  The patient was educated that we can provide 3 monthly scripts for their medication, it is their responsibility to follow the instructions.  Side effects or complications from medications: Denies side effects  Patient is aware that pain medications are meant to minimize the severity of the pain to allow their pain levels to improve to allow for better function. They are aware of that pain medications cannot totally remove their pain.  Due for UDT ( at least once per year) (pain management contract is also completed at the time of the UDT): Today  Scale of 1 to 10 ( 1 is least 10 is most) Your pain level without the medicine: 8 Your pain level with medication 6  Scale 1 to 10 ( 1-helps very little, 10 helps very well) How well does your pain medication reduce your pain so you can function better through out the day? 7  Quality  of the pain: Throbbing  Persistence of the pain: All the time  Modifying factors: Worse with activity       Review of Systems     Objective:    Physical Exam   VITALS: BP- 132/74 CHEST: Abnormal lung sounds. CARDIOVASCULAR: Normal heart sounds. ABDOMEN: Middle abdomen tenderness. SKIN: Left knee wound with infection.           Assessment & Plan:  Assessment and Plan    Rectal Bleeding New onset of rectal bleeding for the past 5  days. No associated constipation. Rectal exam did not show any visible blood. Possible internal hemorrhoids. -Order lab work to assess for anemia. -Consider consultation with Dr. Kendell Bane if persistent problems or other issues arise.  Chronic Obstructive Pulmonary Disease (COPD) Exacerbation of symptoms with increased cough and production of green sputum. Recent cold 3 weeks ago. Use of inhaler provides some relief. -Prescribe Doxycycline twice a day for 7 days. -Order chest x-ray.  Chronic Pain Chronic leg and back pain. Pain level of 6-7/10. Hydrocodone provides some relief. -Continue Hydrocodone, no more than one twice a day.  Hyperlipidemia Well controlled on Lipitor. -Continue Lipitor. -Plan to check cholesterol profile in early spring.  Psychiatric Illness Managed by Dr. Tenny Craw. -Continue current management with Dr. Tenny Craw.  General Health Maintenance -Plan for annual chest scan for lung cancer screening. -Encourage smoking cessation.      1. Encounter for long-term opiate analgesic use The patient was seen in followup for chronic pain. A review over at their current pain status was discussed. Drug registry was checked. Prescriptions were given.  Regular follow-up recommended. Discussion was held regarding the importance of compliance with medication as well as pain medication contract.  Patient was informed that medication may cause drowsiness and should not be combined  with other medications/alcohol or street drugs. If the patient feels medication is causing altered alertness then do not drive or operate dangerous equipment.  Should be noted that the patient appears to be meeting appropriate use of opioids and response.  Evidenced by improved function and decent pain control without significant side effects and no evidence of overt aberrancy issues.  Upon discussion with the patient today they understand that opioid therapy is optional and they feel that the pain has been  refractory to reasonable conservative measures and is significant and affecting quality of life enough to warrant ongoing therapy and wishes to continue opioids.  Refills were provided.  [facility] medical Board guidelines regarding the pain medicine has been reviewed.  CDC guidelines most updated 2022 has been reviewed by the prescriber.  PDMP is checked on a regular basis yearly urine drug screen and pain management contract  - [employer] 13 (MW), Urine  2. Rectal bleeding We will be going ahead and set him up with gastroenterology for consultation Hemoccult was positive his fingerstick hemoglobin here was good I do not find any evidence of a instability or excessive bleeding I did tell him that if he gets worse over the weekend to go immediately to the ER - CBC with Differential - Hepatic Function Panel - Hemoglobin  3. Chronic pain syndrome Continue pain medicine but do not exceed 1 or 2/day I have also encouraged him that he should talk with Dr. Tenny Craw about the possibility of cutting back on his alprazolam if possible gradually but only under her direction  4. Essential hypertension Blood pressure good control continue current measures - Basic Metabolic Panel  5. Stage 2 moderate COPD by GOLD classification (  HCC) Patient was encouraged to quit smoking he is due for lung cancer screening test he does have some mild bronchitis today that is more than likely a COPD exacerbation being treated by doxycycline  6. DOE (dyspnea on exertion) Chest x-ray ordered - DG Chest 2 View  7. Flu vaccine need Flu shot today - Flu Vaccine Trivalent High Dose (Fluad) He does have a wound on his left knee from a fall we will treat this with doxycycline if it gets worse over the next week or 2 to follow-up immediately

## 2023-03-19 LAB — CBC WITH DIFFERENTIAL/PLATELET
Basophils Absolute: 0 10*3/uL (ref 0.0–0.2)
Basos: 0 %
EOS (ABSOLUTE): 0 10*3/uL (ref 0.0–0.4)
Eos: 0 %
Hematocrit: 50.1 % (ref 37.5–51.0)
Hemoglobin: 17 g/dL (ref 13.0–17.7)
Immature Grans (Abs): 0.1 10*3/uL (ref 0.0–0.1)
Immature Granulocytes: 1 %
Lymphocytes Absolute: 1.8 10*3/uL (ref 0.7–3.1)
Lymphs: 15 %
MCH: 31.6 pg (ref 26.6–33.0)
MCHC: 33.9 g/dL (ref 31.5–35.7)
MCV: 93 fL (ref 79–97)
Monocytes Absolute: 0.6 10*3/uL (ref 0.1–0.9)
Monocytes: 5 %
Neutrophils Absolute: 9.8 10*3/uL — ABNORMAL HIGH (ref 1.4–7.0)
Neutrophils: 79 %
Platelets: 306 10*3/uL (ref 150–450)
RBC: 5.38 x10E6/uL (ref 4.14–5.80)
RDW: 12.8 % (ref 11.6–15.4)
WBC: 12.4 10*3/uL — ABNORMAL HIGH (ref 3.4–10.8)

## 2023-03-19 LAB — BASIC METABOLIC PANEL
BUN/Creatinine Ratio: 14 (ref 10–24)
BUN: 12 mg/dL (ref 8–27)
CO2: 25 mmol/L (ref 20–29)
Calcium: 9.4 mg/dL (ref 8.6–10.2)
Chloride: 100 mmol/L (ref 96–106)
Creatinine, Ser: 0.84 mg/dL (ref 0.76–1.27)
Glucose: 77 mg/dL (ref 70–99)
Potassium: 3.4 mmol/L — ABNORMAL LOW (ref 3.5–5.2)
Sodium: 144 mmol/L (ref 134–144)
eGFR: 96 mL/min/{1.73_m2} (ref >59)

## 2023-03-19 LAB — HEPATIC FUNCTION PANEL
ALT: 19 [IU]/L (ref 0–44)
AST: 22 [IU]/L (ref 0–40)
Albumin: 4.4 g/dL (ref 3.9–4.9)
Alkaline Phosphatase: 147 [IU]/L — ABNORMAL HIGH (ref 44–121)
Bilirubin Total: 1 mg/dL (ref 0.0–1.2)
Bilirubin, Direct: 0.31 mg/dL (ref 0.00–0.40)
Total Protein: 7.1 g/dL (ref 6.0–8.5)

## 2023-03-21 ENCOUNTER — Other Ambulatory Visit: Payer: Self-pay

## 2023-03-21 DIAGNOSIS — K625 Hemorrhage of anus and rectum: Secondary | ICD-10-CM

## 2023-03-22 ENCOUNTER — Encounter (INDEPENDENT_AMBULATORY_CARE_PROVIDER_SITE_OTHER): Payer: Self-pay | Admitting: *Deleted

## 2023-03-23 ENCOUNTER — Other Ambulatory Visit: Payer: Self-pay

## 2023-03-23 ENCOUNTER — Encounter (INDEPENDENT_AMBULATORY_CARE_PROVIDER_SITE_OTHER): Payer: Self-pay | Admitting: Gastroenterology

## 2023-03-23 ENCOUNTER — Ambulatory Visit (INDEPENDENT_AMBULATORY_CARE_PROVIDER_SITE_OTHER): Payer: Medicare HMO | Admitting: Gastroenterology

## 2023-03-23 VITALS — BP 117/74 | HR 66 | Temp 97.5°F | Ht 70.0 in | Wt 168.1 lb

## 2023-03-23 DIAGNOSIS — K625 Hemorrhage of anus and rectum: Secondary | ICD-10-CM

## 2023-03-23 DIAGNOSIS — K649 Unspecified hemorrhoids: Secondary | ICD-10-CM | POA: Diagnosis not present

## 2023-03-23 DIAGNOSIS — K921 Melena: Secondary | ICD-10-CM | POA: Diagnosis not present

## 2023-03-23 DIAGNOSIS — K641 Second degree hemorrhoids: Secondary | ICD-10-CM

## 2023-03-23 DIAGNOSIS — J449 Chronic obstructive pulmonary disease, unspecified: Secondary | ICD-10-CM

## 2023-03-23 DIAGNOSIS — K5904 Chronic idiopathic constipation: Secondary | ICD-10-CM | POA: Insufficient documentation

## 2023-03-23 DIAGNOSIS — R911 Solitary pulmonary nodule: Secondary | ICD-10-CM

## 2023-03-23 DIAGNOSIS — E876 Hypokalemia: Secondary | ICD-10-CM

## 2023-03-23 LAB — TOXASSURE SELECT 13 (MW), URINE

## 2023-03-23 MED ORDER — POTASSIUM CHLORIDE CRYS ER 10 MEQ PO TBCR
10.0000 meq | EXTENDED_RELEASE_TABLET | Freq: Every day | ORAL | 0 refills | Status: DC
Start: 2023-03-23 — End: 2023-12-23

## 2023-03-23 MED ORDER — HYDROCORTISONE ACETATE 25 MG RE SUPP
25.0000 mg | Freq: Two times a day (BID) | RECTAL | 0 refills | Status: AC
Start: 2023-03-23 — End: 2023-03-30

## 2023-03-23 MED ORDER — POLYETHYLENE GLYCOL 3350 17 G PO PACK
17.0000 g | PACK | Freq: Two times a day (BID) | ORAL | 0 refills | Status: AC
Start: 2023-03-23 — End: 2023-06-21

## 2023-03-23 MED ORDER — PSYLLIUM 58.6 % PO PACK
1.0000 | PACK | Freq: Two times a day (BID) | ORAL | 2 refills | Status: AC
Start: 2023-03-23 — End: 2023-06-21

## 2023-03-23 MED ORDER — PHENYLEPHRINE-MINERAL OIL-PET 0.25-14-74.9 % RE OINT
1.0000 | TOPICAL_OINTMENT | Freq: Two times a day (BID) | RECTAL | 0 refills | Status: AC
Start: 2023-03-23 — End: 2023-03-30

## 2023-03-23 NOTE — Progress Notes (Signed)
[name] , M.D. Gastroenterology & Hepatology [facility] Gastroenterology [address] [name], [place] [address] Primary Care Physician: Babs Sciara, MD [address] [place] [address]  Chief Complaint:  hematochezia   History of Present Illness: [name] is a 67 y.o. male with multiple comorbidities including CAD status post three-vessel CABG due to STEMI, history of colon cancer in situ who presents for evaluation of Painless hematochezia .  Patient reports that for past 45 days he is noticing fresh blood per rectum.  He is having 1-2 bowel movements and noticing blood dripping in the toilet bowel.  No nocturnal symptoms.  Patient denies any dizziness chest pain or shortness of breath.  Patient reports that he has 1 soft bowel movement daily but sometimes he has to strain.  Denies any abdominal pain  Last VHQ:[id number]  - Normal esophagus. Dilated. - Small hiatal hernia. - Normal duodenal bulb and second portion of the duodenum. Except for a duodenal polyp/ removed as described above  Last Colonoscopy:10/2021 One 3 mm polyp in the distal rectum, removed with a cold biopsy forceps. Resected and retrieved. - Non- bleeding internal hemorrhoids. Normal- appearing terminal ileum. - The examination was otherwise normal on direct and retroflexion views. Status post segmental biopsy  A. DUODENUM POLYP, BIOPSY:  Reactive duodenal mucosa with lymphoid aggregate  Negative for dysplasia and gastric metaplasia   B. ASCENDING COLON, BIOPSY:  Benign colonic mucosa with no diagnostic abnormality   C. DESCENDING COLON, BIOPSY:  Benign colonic mucosa with no diagnostic abnormality   D. RECTUM, POLYPECTOMY:  Hyperplastic polyp  Negative for dysplasia and carcinoma   Past Medical History: Past Medical History:  Diagnosis Date   Anxiety    Anxiety and depression    ASCVD (arteriosclerotic cardiovascular disease)    a. s/p PTCA to LCx  in 1993 b. BMS to RCA in 01/2012 with residual 80% OM2 stenosis and medical management recommended c. 08/2019: cath showing patent stents with occluded OM2 --> medical management. d. s/p CABG in 02/2021 with LIMA-LAD, reverse SVG-D1 and SVG-OM   Asthma    CAD (coronary artery disease)    Carcinoma in situ of colon 2004   rectal polyp   Colitis, ischemic (HCC) 2011   COPD (chronic obstructive pulmonary disease) (HCC)    mild ;excercise induced hypoxemia by cp stress test ;asthma ,bronchitis,   Depression    Diverticulosis    Gastritis 12/30/2010   EGD Dr Jena Gauss   GERD (gastroesophageal reflux disease)    GI bleed    H. 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)    Leukocytosis    Dr Shon Baton   Lung nodule 07/16/2011   Myocardial infarction [facility])    age 63   Restless leg syndrome    Schatzki's ring    Sleep apnea    does not use CPAP:cannot tolerate, PCP aware   Syncope    Tobacco abuse    50 pack years continuing at one halp pack daily   Tubular adenoma of colon 06/2009   Colonosocpy Dr Jena Gauss    Past Surgical History: Past Surgical History:  Procedure Laterality Date   BIOPSY  12/19/2017   Procedure: BIOPSY;  Surgeon: Corbin Ade, MD;  [facility];  Service: Endoscopy;;  gastric   BIOPSY  11/05/2021  Procedure: BIOPSY;  Surgeon: Corbin Ade, MD;  [facility];  Service: Endoscopy;;   CARDIAC CATHETERIZATION     CHOLECYSTECTOMY  2004   COLONOSCOPY  06/2009   normal terminal ileum, segmental mild inflammation of sigmoid colon (bx unremarkable), polyp, tubular adenoma   COLONOSCOPY N/A 07/31/2013   Dr.Rourk- redundant anal canal hemorrhoids, colonic diverticulosis, tubular adenoma   COLONOSCOPY W/ POLYPECTOMY  2004   rectal polyp with carcinoma in situ removed via colonoscopy   COLONOSCOPY WITH PROPOFOL N/A 09/15/2015   Dr.  Jena Gauss: Scattered diverticula throughout the colon, 2 sessile polyps found in the descending colon and cecum, 5 mm in size.  Cecal polyp was sessile serrated polyp, descending colon polyp was a tubular adenoma.  He had a abnormal perianal exam along with grade 3 hemorrhoids.  Surveillance exam recommended for 5-year follow-up.   COLONOSCOPY WITH PROPOFOL N/A 12/19/2017   one 5 mm polyp in ascending colon and 1 cm sessile polyp in ascending s/p removal. Tubular adenoma. internal hemorrhoids   COLONOSCOPY WITH PROPOFOL N/A 11/05/2021   Procedure: COLONOSCOPY WITH PROPOFOL;  Surgeon: Corbin Ade, MD;  [facility];  Service: Endoscopy;  Laterality: N/A;  12:30pm   CORONARY ANGIOPLASTY WITH STENT PLACEMENT  01/26/2012   "1; total is now 2"   CORONARY ARTERY BYPASS GRAFT N/A 03/13/2021   Procedure: CORONARY ARTERY BYPASS GRAFTING (CABG), ON PUMP, TIMES THREE, USING LEFT INTERNAL MAMMARY ARTERY AND RIGHT ENDOSCOPICALLY HARVESTED GREATER SAPHENOUS VEIN;  Surgeon: Corliss Skains, MD;  Location: [facility] OR;  Service: Open Heart Surgery;  Laterality: N/A;   CORONARY/GRAFT ACUTE MI REVASCULARIZATION N/A 03/10/2021   Procedure: Coronary/Graft Acute MI Revascularization;  Surgeon: Lyn Records, MD;  Location: [facility] INVASIVE CV LAB;  Service: Cardiovascular;  Laterality: N/A;   ENDOVEIN HARVEST OF GREATER SAPHENOUS VEIN Right 03/13/2021   Procedure: ENDOVEIN HARVEST OF GREATER SAPHENOUS VEIN;  Surgeon: Corliss Skains, MD;  Location: [facility] OR;  Service: Open Heart Surgery;  Laterality: Right;   ESOPHAGOGASTRODUODENOSCOPY  02/2009   query Barrett's but bx negative   ESOPHAGOGASTRODUODENOSCOPY  12/30/2010   [name],gastritis, dilated 57F, sm HH, 1 small ulcer, Duodenal erosions, benign bx   ESOPHAGOGASTRODUODENOSCOPY (EGD) WITH ESOPHAGEAL DILATION N/A 09/12/2012   WUJ:[id number] Schatzki's ring s/p Maloney dilator. Small hiatal hernia. negative path   ESOPHAGOGASTRODUODENOSCOPY (EGD) WITH ESOPHAGEAL  DILATION N/A 07/31/2013   Dr. Jena Gauss- normal egd, s/p [facility] dilation empirically. Normal small bowel biopsies    ESOPHAGOGASTRODUODENOSCOPY (EGD) WITH PROPOFOL N/A 09/15/2015   Dr. Jena Gauss: Medium sized hiatal hernia, normal-appearing esophagus status post empiric dilation   ESOPHAGOGASTRODUODENOSCOPY (EGD) WITH PROPOFOL N/A 12/19/2017   erosive esophagitis s/p dilation, erosive gastropathy, normal duodenum   ESOPHAGOGASTRODUODENOSCOPY (EGD) WITH PROPOFOL N/A 09/17/2019   Non-obstructing Schatzki's ring s/p dilation, medium-sized hiatal hernia, otherwise normal.   ESOPHAGOGASTRODUODENOSCOPY (EGD) WITH PROPOFOL N/A 11/05/2021   Procedure: ESOPHAGOGASTRODUODENOSCOPY (EGD) WITH PROPOFOL;  Surgeon: Corbin Ade, MD;  [facility];  Service: Endoscopy;  Laterality: N/A;   FLEXIBLE SIGMOIDOSCOPY  12/30/2010    [name],; internal hemorrhoids, anal papilla   HAND SURGERY     surgical intervention for injury of the fingers of the left hand many years ago   heart stent     HEMORRHOID BANDING     Dr. Jena Gauss   LEFT HEART CATH AND CORONARY ANGIOGRAPHY N/A 08/29/2019   Procedure: LEFT HEART CATH AND CORONARY ANGIOGRAPHY;  Surgeon: Marykay Lex, MD;  Location: [facility] INVASIVE CV LAB;  Service: Cardiovascular;  Laterality: N/A;   LEFT HEART CATH AND CORONARY ANGIOGRAPHY N/A 03/10/2021   Procedure: LEFT HEART CATH AND CORONARY ANGIOGRAPHY;  Surgeon: Lyn Records, MD;  [facility] CV LAB;  Service: Cardiovascular;  Laterality: N/A;   LEFT HEART CATHETERIZATION WITH CORONARY ANGIOGRAM N/A 01/26/2012   Procedure: LEFT HEART CATHETERIZATION WITH CORONARY ANGIOGRAM;  Surgeon: Rollene Rotunda, MD;  Location: [facility] CATH LAB;  Service: Cardiovascular;  Laterality: N/A;   MALONEY DILATION N/A 09/15/2015   Procedure: Elease Hashimoto DILATION;  Surgeon: Corbin Ade, MD;  [facility];  Service: Endoscopy;  Laterality: N/A;   MALONEY DILATION N/A 12/19/2017   Procedure: Elease Hashimoto DILATION;  Surgeon:  Corbin Ade, MD;  [facility];  Service: Endoscopy;  Laterality: N/A;   MALONEY DILATION N/A 09/17/2019   Procedure: Elease Hashimoto DILATION;  Surgeon: Corbin Ade, MD;  [facility];  Service: Endoscopy;  Laterality: N/A;   MALONEY DILATION N/A 11/05/2021   Procedure: Elease Hashimoto DILATION;  Surgeon: Corbin Ade, MD;  [facility];  Service: Endoscopy;  Laterality: N/A;   NASAL SEPTOPLASTY W/ TURBINOPLASTY  10/06/2011   Procedure: NASAL SEPTOPLASTY WITH TURBINATE REDUCTION;  Surgeon: Serena Colonel, MD;  [facility];  Service: ENT;  Laterality: Bilateral;   PERCUTANEOUS CORONARY STENT INTERVENTION (PCI-S) N/A 01/26/2012   Procedure: PERCUTANEOUS CORONARY STENT INTERVENTION (PCI-S);  Surgeon: Rollene Rotunda, MD;  Location: [facility] CATH LAB;  Service: Cardiovascular;  Laterality: N/A;   POLYPECTOMY  09/15/2015   Procedure: POLYPECTOMY;  Surgeon: Corbin Ade, MD;  [facility];  Service: Endoscopy;;  Cecal polyp removed via cold snare/ Descending colon polyp removed via cold snare   POLYPECTOMY  12/19/2017   Procedure: POLYPECTOMY;  Surgeon: Corbin Ade, MD;  [facility];  Service: Endoscopy;;  colon   TEE WITHOUT CARDIOVERSION N/A 03/13/2021   Procedure: TRANSESOPHAGEAL ECHOCARDIOGRAM (TEE);  Surgeon: Corliss Skains, MD;  Location: [facility] OR;  Service: Open Heart Surgery;  Laterality: N/A;    Family History: Family History  Problem Relation Age of Onset   Lung disease Father        deceased, black lung   Heart disease Mother        blood clots   Depression Mother    Cancer Paternal Uncle        unknown type   Cancer Maternal Aunt        unknown type   Kidney failure Maternal Uncle    Hypertension Brother    Colon cancer Neg Hx    ADD / ADHD Neg Hx    Alcohol abuse Neg Hx    Drug abuse Neg Hx    Anxiety disorder Neg Hx    Bipolar disorder Neg Hx    Dementia Neg Hx    OCD Neg Hx    Paranoid behavior Neg Hx    Schizophrenia Neg  Hx    Physical abuse Neg Hx    Sexual abuse Neg Hx    Seizures Neg Hx     Social History: Social History   Tobacco Use  Smoking Status Every Day   Current packs/day: 0.00   Average packs/day: 1 pack/day for 51.6 years (51.6 ttl pk-yrs)   Types: Cigarettes   Start date: 07/31/1969   Last attempt to quit: 03/16/2021   Years since quitting: 2.0  Smokeless Tobacco Former   Social History   Substance and Sexual Activity  Alcohol Use Yes   Comment: Drinks  a beer occasionally   Social History   Substance and Sexual Activity  Drug Use No    Allergies: No Known Allergies  Medications: Current Outpatient Medications  Medication Sig Dispense Refill   albuterol (VENTOLIN HFA) 108 (90 Base) MCG/ACT inhaler Inhale 2 puffs into the lungs every 4 (four) hours as needed for wheezing or shortness of breath. 18 g 1   ALPRAZolam (XANAX) 1 MG tablet Take 1 tablet (1 mg total) by mouth 3 (three) times daily. 90 tablet 0   amLODipine (NORVASC) 5 MG tablet TAKE 1 TABLET EVERY DAY 90 tablet 3   aspirin EC 81 MG tablet Take 81 mg by mouth daily.     atorvastatin (LIPITOR) 80 MG tablet TAKE 1 TABLET EVERY DAY 90 tablet 3   Cyanocobalamin (VITAMIN B-12 PO) Take 1 tablet by mouth in the morning.     FLUoxetine (PROZAC) 40 MG capsule TAKE 1 CAPSULE TWICE DAILY 180 capsule 3   gabapentin (NEURONTIN) 300 MG capsule TAKE 2 CAPSULES THREE TIMES DAILY 540 capsule 3   HYDROcodone-acetaminophen (NORCO/VICODIN) 5-325 MG tablet TAKE ONE TABLET TWICE DAILY AS NEEDED FOR PAIN to last 30 days 45 tablet 0   isosorbide mononitrate (IMDUR) 30 MG 24 hr tablet Take 30 mg by mouth daily.     meloxicam (MOBIC) 15 MG tablet TAKE 1 TABLET EVERY DAY 90 tablet 1   metoprolol succinate (TOPROL-XL) 50 MG 24 hr tablet TAKE 1 TABLET EVERY DAY 90 tablet 3   mupirocin ointment (BACTROBAN) 2 % Apply thin amount bid to knee wound for 10 days 15 g 2   nitroGLYCERIN (NITROSTAT) 0.4 MG SL tablet Place 1 tablet (0.4 mg total) under  the tongue every 5 (five) minutes as needed for chest pain. 20 tablet 5   OLANZapine (ZYPREXA) 10 MG tablet Take 1 tablet (10 mg total) by mouth at bedtime. 90 tablet 3   pantoprazole (PROTONIX) 40 MG tablet TAKE 1 TABLET TWICE DAILY BEFORE MEALS 180 tablet 3   rOPINIRole (REQUIP) 2 MG tablet TAKE 1 TABLET AT BEDTIME 90 tablet 3   tamsulosin (FLOMAX) 0.4 MG CAPS capsule TAKE 1 CAPSULE EVERY DAY 90 capsule 3   traZODone (DESYREL) 50 MG tablet TAKE 1 TABLET AT BEDTIME 90 tablet 3   TRELEGY ELLIPTA 100-62.5-25 MCG/ACT AEPB INHALE 1 PUFF INTO THE LUNGS DAILY. 180 each 3   valsartan (DIOVAN) 80 MG tablet TAKE 1 TABLET EVERY DAY 90 tablet 3   doxycycline (VIBRA-TABS) 100 MG tablet Take 1 tablet (100 mg total) by mouth 2 (two) times daily. 14 tablet 0   lipase/protease/amylase (CREON) 36000 UNITS CPEP capsule Take 2 capsules (72,000 Units total) by mouth 3 (three) times daily with meals. May also take 1 capsule (36,000 Units total) as needed (with snacks). (Patient not taking: Reported on 03/23/2023) 240 capsule 3   potassium chloride (KLOR-CON M) 10 MEQ tablet Take 1 tablet (10 mEq total) by mouth daily for 5 doses. 5 tablet 0   No current facility-administered medications for this visit.    Review of Systems: GENERAL: negative for malaise, night sweats HEENT: No changes in hearing or vision, no nose bleeds or other nasal problems. NECK: Negative for lumps, goiter, pain and significant neck swelling RESPIRATORY: Negative for cough, wheezing CARDIOVASCULAR: Negative for chest pain, leg swelling, palpitations, orthopnea GI: SEE HPI MUSCULOSKELETAL: Negative for joint pain or swelling, back pain, and muscle pain. SKIN: Negative for lesions, rash HEMATOLOGY Negative for prolonged bleeding, bruising easily, and swollen nodes. ENDOCRINE: Negative for cold  or heat intolerance, polyuria, polydipsia and goiter. NEURO: negative for tremor, gait imbalance, syncope and seizures. The remainder of the review  of systems is noncontributory.   Physical Exam: BP 117/74 (BP Location: Left Arm, Patient Position: Sitting, Cuff Size: Normal)   Pulse 66   Temp (!) 97.5 F (36.4 C) (Temporal)   Ht 5\' 10"  (1.778 m)   Wt 168 lb 1.6 oz (76.2 kg)   BMI 24.12 kg/m  GENERAL: The patient is AO x3, in no acute distress. HEENT: Head is normocephalic and atraumatic. EOMI are intact. Mouth is well hydrated and without lesions. NECK: Supple. No masses LUNGS: Clear to auscultation. No presence of rhonchi/wheezing/rales. Adequate chest expansion HEART: RRR, normal s1 and s2. ABDOMEN: Soft, nontender, no guarding, no peritoneal signs, and nondistended. BS +. No masses.   Imaging/Labs: as above     Latest Ref Rng & Units 03/18/2023    3:00 PM 03/18/2023    2:52 PM 07/21/2021    9:48 AM  CBC  WBC 3.4 - 10.8 x10E3/uL 12.4   7.2   Hemoglobin 13.0 - 17.7 g/dL 40.9  81.1  91.4   Hematocrit 37.5 - 51.0 % 50.1   49.9   Platelets 150 - 450 x10E3/uL 306   243    No results found for: "IRON", "TIBC", "FERRITIN"  I personally reviewed and interpreted the available labs, imaging and endoscopic files.  Impression and Plan: [name] is a 67 y.o. male with multiple comorbidities including CAD status post three-vessel CABG due to STEMI, history of colon cancer in situ who presents for evaluation of Painless hematochezia .  #Painless hematochezia   Patient having painless hematochezia for 4 to 5 days 1-2 bowel movements daily.  Most recent BUN/creatinine 12/0.84 hence unlikely had upper GI bleed.  Also hemoglobin ordered by PCP has rather trended up to 17  Patient had a fairly recent bidirectional endoscopy on 10/2021 with upper endoscopy and colonoscopy with TI intubation.  I assume this to be benign lesion leading to hematochezia such as hemorrhoidal bleed and expect it to be self resolving  Will repeat CBC  Now, if significant drop in hemoglobin then we will plan to send the patient to the ER versus  outpatient early endoscopy evaluation  Also for now we will treat his hemorrhoids with duration age and Anusol suppositories for 5 to 7 days  Ensure that he is not constipated: Ensure adequate fluid intake: Aim for 8 glasses of water daily. Follow a high fiber diet: Include foods such as dates, prunes, pears, and kiwi. Take Miralax twice a day for the first week, then reduce to once daily thereafter. Use Metamucil twice a day.  ER precautions given to patient if any significant bleeding, fatigue chest pain or blurry vision go to the ER  All questions were answered.      [name], MD Gastroenterology and Hepatology [facility] Gastroenterology   This chart has been completed using [facility] Dictation software, and while attempts have been made to ensure accuracy , certain words and phrases may not be transcribed as intended

## 2023-03-23 NOTE — Patient Instructions (Addendum)
It was very nice to meet you today, as dicussed with will plan for the following :  1) obtain blood work now  2) Ensure adequate fluid intake: Aim for 8 glasses of water daily. Follow a high fiber diet: Include foods such as dates, prunes, pears, and kiwi. Take Miralax twice a day for the first week, then reduce to once daily thereafter. Use Metamucil twice a day.  3) preparation H and Hydrocortisone suppository

## 2023-03-24 ENCOUNTER — Ambulatory Visit (HOSPITAL_COMMUNITY): Payer: Medicare HMO | Admitting: Psychiatry

## 2023-03-24 LAB — CBC
Hematocrit: 42.1 % (ref 37.5–51.0)
Hemoglobin: 14.6 g/dL (ref 13.0–17.7)
MCH: 32.2 pg (ref 26.6–33.0)
MCHC: 34.7 g/dL (ref 31.5–35.7)
MCV: 93 fL (ref 79–97)
Platelets: 253 10*3/uL (ref 150–450)
RBC: 4.53 x10E6/uL (ref 4.14–5.80)
RDW: 12.6 % (ref 11.6–15.4)
WBC: 6.8 10*3/uL (ref 3.4–10.8)

## 2023-03-24 NOTE — Progress Notes (Signed)
Spoke to the patient this morning over the phone, reports blood per rectum has subsided and he has not seen any blood in stool.  Hemoglobin remains baseline 14.6 previous 17 was likely hemoconcentrated.  This was likely hemorrhoidal bleed which has subsided.  Patient again advised if he sees significant amount of blood multiple times a day to come to the ER.  Verbalizes understanding

## 2023-03-25 ENCOUNTER — Ambulatory Visit: Payer: Medicare HMO | Attending: Cardiology | Admitting: Cardiology

## 2023-03-25 ENCOUNTER — Encounter: Payer: Self-pay | Admitting: Cardiology

## 2023-03-25 VITALS — BP 120/84 | HR 60 | Ht 71.0 in | Wt 167.4 lb

## 2023-03-25 DIAGNOSIS — I10 Essential (primary) hypertension: Secondary | ICD-10-CM

## 2023-03-25 DIAGNOSIS — I25118 Atherosclerotic heart disease of native coronary artery with other forms of angina pectoris: Secondary | ICD-10-CM | POA: Diagnosis not present

## 2023-03-25 DIAGNOSIS — E782 Mixed hyperlipidemia: Secondary | ICD-10-CM

## 2023-03-25 NOTE — Patient Instructions (Signed)
Medication Instructions:  Your physician recommends that you continue on your current medications as directed. Please refer to the Current Medication list given to you today.   Labwork: Lipids Fasting  Testing/Procedures: None  Follow-Up: Your physician recommends that you schedule a follow-up appointment in: 6 months  Any Other Special Instructions Will Be Listed Below (If Applicable).  If you need a refill on your cardiac medications before your next appointment, please call your pharmacy.

## 2023-03-25 NOTE — Progress Notes (Signed)
Clinical Summary Mr. Ebel is a 67 y.o.male seen today for follow up of the following medical problems.    1. CAD - hx of infeior MI with prior PTCA to LCX in 1993 -  cath 2011 LM patent, LAD prox 40%, LCX prox 40%, OM1 30%, OM2 80% small too small for intervention, RCA very small non dom, 90%. LVEF 50-55% by LV gram.   08/2011 echo LVEF 55%, grade I diastolic dysfunction.   03/2016 echo: LVEF 50%, normal diastolic function      08/2017 nuclear stress: inferior /inferolaterla infarct without ischemia. LVEF 45%   02/2021 CABG LIMA-LAD, reverse SVG-D1 and SVG-OM, presented initially with ACS 02/2021 echo: LVEF 45-50%, mild to mod MR     - 1-2 times per month sharp pain midchest, lasts for a minutes. Worst with pressing on area at surgical site. No consistent exertional pains.        2. Hyperlipidemia 07/2021 TC 158 TG 99 HDL 44 LDL 96 - 07/2022 TC 161 TG 096 HDL 32 LDL 75 - compliant with meds   3. HTN - compliant with meds   4. COPD - followed by pcp     5. OSA - does not use CPAP machine due to discomfort     6. GERD/Dysphagia - followed by GI     7. Carotid stenosis -02/2021 carotid US bilateral 1-39% disease      AAA screen 03/2022 normal   SH: enjoys working in yard, fishing.  Daughter fairly recently committed suicide Past Medical History:  Diagnosis Date   Anxiety    Anxiety and depression    ASCVD (arteriosclerotic cardiovascular disease)    a. s/p PTCA to LCx in 1993 b. BMS to RCA in 01/2012 with residual 80% OM2 stenosis and medical management recommended c. 08/2019: cath showing patent stents with occluded OM2 --> medical management. d. s/p CABG in 02/2021 with LIMA-LAD, reverse SVG-D1 and SVG-OM   Asthma    CAD (coronary artery disease)    Carcinoma in situ of colon 2004   rectal polyp   Colitis, ischemic (HCC) 2011   COPD (chronic obstructive pulmonary disease) (HCC)    mild ;excercise induced hypoxemia by cp stress test ;asthma ,bronchitis,    Depression    Diverticulosis    Gastritis 12/30/2010   EGD Dr Jena Gauss   GERD (gastroesophageal reflux disease)    GI bleed    H. 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)    Leukocytosis    Dr Shon Baton   Lung nodule 07/16/2011   Myocardial infarction [facility])    age 61   Restless leg syndrome    Schatzki's ring    Sleep apnea    does not use CPAP:cannot tolerate, PCP aware   Syncope    Tobacco abuse    50 pack years continuing at one halp pack daily   Tubular adenoma of colon 06/2009   Colonosocpy Dr Jena Gauss     No Known Allergies   Current Outpatient Medications  Medication Sig Dispense Refill   albuterol (VENTOLIN HFA) 108 (90 Base) MCG/ACT inhaler Inhale 2 puffs into the lungs every 4 (four) hours as needed for wheezing or shortness of breath. 18 g 1   ALPRAZolam (XANAX) 1 MG tablet Take 1 tablet (1 mg total) by mouth 3 (three) times daily. 90 tablet 0  amLODipine (NORVASC) 5 MG tablet TAKE 1 TABLET EVERY DAY 90 tablet 3   aspirin EC 81 MG tablet Take 81 mg by mouth daily.     atorvastatin (LIPITOR) 80 MG tablet TAKE 1 TABLET EVERY DAY 90 tablet 3   Cyanocobalamin (VITAMIN B-12 PO) Take 1 tablet by mouth in the morning.     doxycycline (VIBRA-TABS) 100 MG tablet Take 1 tablet (100 mg total) by mouth 2 (two) times daily. 14 tablet 0   FLUoxetine (PROZAC) 40 MG capsule TAKE 1 CAPSULE TWICE DAILY 180 capsule 3   gabapentin (NEURONTIN) 300 MG capsule TAKE 2 CAPSULES THREE TIMES DAILY 540 capsule 3   HYDROcodone-acetaminophen (NORCO/VICODIN) 5-325 MG tablet TAKE ONE TABLET TWICE DAILY AS NEEDED FOR PAIN to last 30 days 45 tablet 0   hydrocortisone (ANUSOL-HC) 25 MG suppository Place 1 suppository (25 mg total) rectally 2 (two) times daily for 7 days. 12 suppository 0   isosorbide mononitrate (IMDUR) 30 MG 24 hr tablet Take 30 mg by mouth daily.      lipase/protease/amylase (CREON) 36000 UNITS CPEP capsule Take 2 capsules (72,000 Units total) by mouth 3 (three) times daily with meals. May also take 1 capsule (36,000 Units total) as needed (with snacks). (Patient not taking: Reported on 03/23/2023) 240 capsule 3   meloxicam (MOBIC) 15 MG tablet TAKE 1 TABLET EVERY DAY 90 tablet 1   metoprolol succinate (TOPROL-XL) 50 MG 24 hr tablet TAKE 1 TABLET EVERY DAY 90 tablet 3   mupirocin ointment (BACTROBAN) 2 % Apply thin amount bid to knee wound for 10 days 15 g 2   nitroGLYCERIN (NITROSTAT) 0.4 MG SL tablet Place 1 tablet (0.4 mg total) under the tongue every 5 (five) minutes as needed for chest pain. 20 tablet 5   OLANZapine (ZYPREXA) 10 MG tablet Take 1 tablet (10 mg total) by mouth at bedtime. 90 tablet 3   pantoprazole (PROTONIX) 40 MG tablet TAKE 1 TABLET TWICE DAILY BEFORE MEALS 180 tablet 3   phenylephrine-shark liver oil-mineral oil-petrolatum (PREPARATION H) 0.25-14-74.9 % rectal ointment Place 1 Application rectally in the morning and at bedtime for 7 days. 28 g 0   polyethylene glycol (MIRALAX / GLYCOLAX) 17 g packet Take 17 g by mouth 2 (two) times daily. 180 packet 0   potassium chloride (KLOR-CON M) 10 MEQ tablet Take 1 tablet (10 mEq total) by mouth daily for 5 doses. 5 tablet 0   psyllium (METAMUCIL) 58.6 % packet Take 1 packet by mouth 2 (two) times daily. 60 packet 2   rOPINIRole (REQUIP) 2 MG tablet TAKE 1 TABLET AT BEDTIME 90 tablet 3   tamsulosin (FLOMAX) 0.4 MG CAPS capsule TAKE 1 CAPSULE EVERY DAY 90 capsule 3   traZODone (DESYREL) 50 MG tablet TAKE 1 TABLET AT BEDTIME 90 tablet 3   TRELEGY ELLIPTA 100-62.5-25 MCG/ACT AEPB INHALE 1 PUFF INTO THE LUNGS DAILY. 180 each 3   valsartan (DIOVAN) 80 MG tablet TAKE 1 TABLET EVERY DAY 90 tablet 3   No current facility-administered medications for this visit.     Past Surgical History:  Procedure Laterality Date   BIOPSY  12/19/2017   Procedure: BIOPSY;  Surgeon: Corbin Ade, MD;   [facility];  Service: Endoscopy;;  gastric   BIOPSY  11/05/2021   Procedure: BIOPSY;  Surgeon: Corbin Ade, MD;  [facility];  Service: Endoscopy;;   CARDIAC CATHETERIZATION     CHOLECYSTECTOMY  2004   COLONOSCOPY  06/2009   normal terminal  ileum, segmental mild inflammation of sigmoid colon (bx unremarkable), polyp, tubular adenoma   COLONOSCOPY N/A 07/31/2013   Dr.Rourk- redundant anal canal hemorrhoids, colonic diverticulosis, tubular adenoma   COLONOSCOPY W/ POLYPECTOMY  2004   rectal polyp with carcinoma in situ removed via colonoscopy   COLONOSCOPY WITH PROPOFOL N/A 09/15/2015   Dr. Jena Gauss: Scattered diverticula throughout the colon, 2 sessile polyps found in the descending colon and cecum, 5 mm in size.  Cecal polyp was sessile serrated polyp, descending colon polyp was a tubular adenoma.  He had a abnormal perianal exam along with grade 3 hemorrhoids.  Surveillance exam recommended for 5-year follow-up.   COLONOSCOPY WITH PROPOFOL N/A 12/19/2017   one 5 mm polyp in ascending colon and 1 cm sessile polyp in ascending s/p removal. Tubular adenoma. internal hemorrhoids   COLONOSCOPY WITH PROPOFOL N/A 11/05/2021   Procedure: COLONOSCOPY WITH PROPOFOL;  Surgeon: Corbin Ade, MD;  [facility];  Service: Endoscopy;  Laterality: N/A;  12:30pm   CORONARY ANGIOPLASTY WITH STENT PLACEMENT  01/26/2012   "1; total is now 2"   CORONARY ARTERY BYPASS GRAFT N/A 03/13/2021   Procedure: CORONARY ARTERY BYPASS GRAFTING (CABG), ON PUMP, TIMES THREE, USING LEFT INTERNAL MAMMARY ARTERY AND RIGHT ENDOSCOPICALLY HARVESTED GREATER SAPHENOUS VEIN;  Surgeon: Corliss Skains, MD;  Location: [facility];  Service: Open Heart Surgery;  Laterality: N/A;   CORONARY/GRAFT ACUTE MI REVASCULARIZATION N/A 03/10/2021   Procedure: Coronary/Graft Acute MI Revascularization;  Surgeon: Lyn Records, MD;  Location: [facility] INVASIVE CV LAB;  Service: Cardiovascular;  Laterality: N/A;   ENDOVEIN  HARVEST OF GREATER SAPHENOUS VEIN Right 03/13/2021   Procedure: ENDOVEIN HARVEST OF GREATER SAPHENOUS VEIN;  Surgeon: Corliss Skains, MD;  Location: [facility] OR;  Service: Open Heart Surgery;  Laterality: Right;   ESOPHAGOGASTRODUODENOSCOPY  02/2009   query Barrett's but bx negative   ESOPHAGOGASTRODUODENOSCOPY  12/30/2010   [name],gastritis, dilated 43F, sm HH, 1 small ulcer, Duodenal erosions, benign bx   ESOPHAGOGASTRODUODENOSCOPY (EGD) WITH ESOPHAGEAL DILATION N/A 09/12/2012   [id number]:[id number] Schatzki's ring s/p Maloney dilator. Small hiatal hernia. negative path   ESOPHAGOGASTRODUODENOSCOPY (EGD) WITH ESOPHAGEAL DILATION N/A 07/31/2013   Dr. Jena Gauss- normal egd, s/p [facility] dilation empirically. Normal small bowel biopsies    ESOPHAGOGASTRODUODENOSCOPY (EGD) WITH PROPOFOL N/A 09/15/2015   Dr. Jena Gauss: Medium sized hiatal hernia, normal-appearing esophagus status post empiric dilation   ESOPHAGOGASTRODUODENOSCOPY (EGD) WITH PROPOFOL N/A 12/19/2017   erosive esophagitis s/p dilation, erosive gastropathy, normal duodenum   ESOPHAGOGASTRODUODENOSCOPY (EGD) WITH PROPOFOL N/A 09/17/2019   Non-obstructing Schatzki's ring s/p dilation, medium-sized hiatal hernia, otherwise normal.   ESOPHAGOGASTRODUODENOSCOPY (EGD) WITH PROPOFOL N/A 11/05/2021   Procedure: ESOPHAGOGASTRODUODENOSCOPY (EGD) WITH PROPOFOL;  Surgeon: Corbin Ade, MD;  [facility];  Service: Endoscopy;  Laterality: N/A;   FLEXIBLE SIGMOIDOSCOPY  12/30/2010    [name],; internal hemorrhoids, anal papilla   HAND SURGERY     surgical intervention for injury of the fingers of the left hand many years ago   heart stent     HEMORRHOID BANDING     Dr. Jena Gauss   LEFT HEART CATH AND CORONARY ANGIOGRAPHY N/A 08/29/2019   Procedure: LEFT HEART CATH AND CORONARY ANGIOGRAPHY;  Surgeon: Marykay Lex, MD;  Location: [facility] INVASIVE CV LAB;  Service: Cardiovascular;  Laterality: N/A;   LEFT HEART CATH AND CORONARY ANGIOGRAPHY N/A  03/10/2021   Procedure: LEFT HEART CATH AND CORONARY ANGIOGRAPHY;  Surgeon: Lyn Records, MD;  Location: [facility] INVASIVE CV  LAB;  Service: Cardiovascular;  Laterality: N/A;   LEFT HEART CATHETERIZATION WITH CORONARY ANGIOGRAM N/A 01/26/2012   Procedure: LEFT HEART CATHETERIZATION WITH CORONARY ANGIOGRAM;  Surgeon: Rollene Rotunda, MD;  Location: [facility] CATH LAB;  Service: Cardiovascular;  Laterality: N/A;   MALONEY DILATION N/A 09/15/2015   Procedure: Elease Hashimoto DILATION;  Surgeon: Corbin Ade, MD;  [facility];  Service: Endoscopy;  Laterality: N/A;   MALONEY DILATION N/A 12/19/2017   Procedure: Elease Hashimoto DILATION;  Surgeon: Corbin Ade, MD;  [facility];  Service: Endoscopy;  Laterality: N/A;   MALONEY DILATION N/A 09/17/2019   Procedure: Elease Hashimoto DILATION;  Surgeon: Corbin Ade, MD;  [facility];  Service: Endoscopy;  Laterality: N/A;   MALONEY DILATION N/A 11/05/2021   Procedure: Elease Hashimoto DILATION;  Surgeon: Corbin Ade, MD;  [facility];  Service: Endoscopy;  Laterality: N/A;   NASAL SEPTOPLASTY W/ TURBINOPLASTY  10/06/2011   Procedure: NASAL SEPTOPLASTY WITH TURBINATE REDUCTION;  Surgeon: Serena Colonel, MD;  Location: [facility];  Service: ENT;  Laterality: Bilateral;   PERCUTANEOUS CORONARY STENT INTERVENTION (PCI-S) N/A 01/26/2012   Procedure: PERCUTANEOUS CORONARY STENT INTERVENTION (PCI-S);  Surgeon: Rollene Rotunda, MD;  Location: [facility] CATH LAB;  Service: Cardiovascular;  Laterality: N/A;   POLYPECTOMY  09/15/2015   Procedure: POLYPECTOMY;  Surgeon: Corbin Ade, MD;  [facility];  Service: Endoscopy;;  Cecal polyp removed via cold snare/ Descending colon polyp removed via cold snare   POLYPECTOMY  12/19/2017   Procedure: POLYPECTOMY;  Surgeon: Corbin Ade, MD;  [facility];  Service: Endoscopy;;  colon   TEE WITHOUT CARDIOVERSION N/A 03/13/2021   Procedure: TRANSESOPHAGEAL ECHOCARDIOGRAM (TEE);  Surgeon: Corliss Skains, MD;   Location: [facility] OR;  Service: Open Heart Surgery;  Laterality: N/A;     No Known Allergies    Family History  Problem Relation Age of Onset   Lung disease Father        deceased, black lung   Heart disease Mother        blood clots   Depression Mother    Cancer Paternal Uncle        unknown type   Cancer Maternal Aunt        unknown type   Kidney failure Maternal Uncle    Hypertension Brother    Colon cancer Neg Hx    ADD / ADHD Neg Hx    Alcohol abuse Neg Hx    Drug abuse Neg Hx    Anxiety disorder Neg Hx    Bipolar disorder Neg Hx    Dementia Neg Hx    OCD Neg Hx    Paranoid behavior Neg Hx    Schizophrenia Neg Hx    Physical abuse Neg Hx    Sexual abuse Neg Hx    Seizures Neg Hx      Social History Mr. Letizia reports that he has been smoking cigarettes. He started smoking about 53 years ago. He has a 51.6 pack-year smoking history. He has quit using smokeless tobacco. Mr. Danielsen reports current alcohol use.   Review of Systems CONSTITUTIONAL: No weight loss, fever, chills, weakness or fatigue.  HEENT: Eyes: No visual loss, blurred vision, double vision or yellow sclerae.No hearing loss, sneezing, congestion, runny nose or sore throat.  SKIN: No rash or itching.  CARDIOVASCULAR: per hpi RESPIRATORY: No shortness of breath, cough or sputum.  GASTROINTESTINAL: No anorexia, nausea, vomiting or diarrhea. No abdominal pain or blood.  GENITOURINARY: No burning on urination, no polyuria NEUROLOGICAL: No headache, dizziness, syncope, paralysis, ataxia, numbness or tingling in the extremities. No change in bowel or bladder control.  MUSCULOSKELETAL: No muscle, back pain, joint pain or stiffness.  LYMPHATICS: No enlarged nodes. No history of splenectomy.  PSYCHIATRIC: No history of depression or anxiety.  ENDOCRINOLOGIC: No reports of sweating, cold or heat intolerance. No polyuria or polydipsia.  Marland Kitchen   Physical Examination Today's Vitals   03/25/23 0806  BP: 120/84   Pulse: 60  SpO2: 92%  Weight: 167 lb 6.4 oz (75.9 kg)  Height: 5\' 11"  (1.803 m)   Body mass index is 23.35 kg/m.  Gen: resting comfortably, no acute distress HEENT: no scleral icterus, pupils equal round and reactive, no palptable cervical adenopathy,  CV: RRR, no m/rg, no jvd Resp: Clear to auscultation bilaterally GI: abdomen is soft, non-tender, non-distended, normal bowel sounds, no hepatosplenomegaly MSK: extremities are warm, no edema.  Skin: warm, no rash Neuro:  no focal deficits Psych: appropriate affect   Diagnostic Studies  02/2010 Cath DESCRIPTION OF PROCEDURE:  The risks and indication were explained. Consent was signed and placed on the chart.  A 4-French arterial sheath was placed in the right femoral artery using a modified Seldinger technique.  A standard catheters including a JL-4, 3DRC, and angled pigtail were used.  All catheter exchanges were made over wire.  There were no apparent complications.  Central aortic pressure 109/67 with a mean of 86.  LV pressure 110/70 with an EDP of 28.  There was no aortic stenosis.   Left main was normal.   LAD was a long vessel coursing to the apex, it gave off a moderate-sized diagonal Boston Catarino.  There was a 40% lesion in the proximal LAD, otherwise minimal luminal irregularities.   Left circumflex was a large dominant vessel, gave off a narrow caliber ramus, large OM-1, and narrow caliber OM-2 in several posterolaterals. In the proximal left circumflex, he had a 40% lesion which extended into the proximal portion of the OM-1.  There was a 30% lesion in the mid OM- 1.  In the OM-2, there was a long 80% stenosis throughout the ostial and proximal portion.  This vessel was very small caliber vessel.   Right coronary artery was a nondominant vessel, gave off an RV Rory Xiang. There was approximately 90% stenosis in the proximal portion.  Once again, this was a very small nondominant vessel.   Left ventriculogram done in  the RAO position showed an EF of 50-55% with inferior basilar akinesis.  There was no significant mitral regurgitation.   Abdominal aortogram showed 40% ostial lesion in the right renal artery. The left renal artery was widely patent.  There was no abdominal aortic aneurysm.  On panning down over the iliac and femoral system, there was no high-grade lesions.   ASSESSMENT: 1. Stable coronary artery disease with high-grade lesions in the small     obtuse marginal 2 and right coronary artery both of which are too     small for percutaneous intervention. 2. Left ventricular ejection fraction was 50-55% with mildly elevated     filling pressures. 3. Very mild right renal artery stenosis. 4. No obvious high-grade proximal peripheral arterial disease.   PLAN/DISCUSSION:  I suspect most of his dyspnea is likely due to his lung disease.  I have urged him to stop smoking.  We will continue medical therapy now for his heart disease.     08/2011 Echo Study Conclusions  -  Left ventricle: The cavity size was normal. There was mild focal basal hypertrophy of the septum. The estimated ejection fraction was 55%. There is akinesis of the basalinferior myocardium. Doppler parameters are consistent with abnormal left ventricular relaxation (grade 1 diastolic dysfunction). - Mitral valve: Trivial regurgitation. - Left atrium: The atrium was at the upper limits of normal in size. - Tricuspid valve: Trivial regurgitation. - Pulmonary arteries: PA peak pressure: 32mm Hg (S). - Pericardium, extracardiac: There was no pericardial effusion.   09/2015 ABIs: 1.2 bilaterally     03/2016 echo Study Conclusions   - Left ventricle: The cavity size was normal. Wall thickness was   normal. The estimated ejection fraction was 50%. There is   akinesis of the basal-midinferolateral and inferior myocardium.   Left ventricular diastolic function parameters were normal. - Aortic valve: Mildly calcified  annulus. Trileaflet. - Mitral valve: Mildly thickened leaflets . There was trivial   regurgitation. - Left atrium: The atrium was at the upper limits of normal in   size. - Right atrium: Central venous pressure (est): 3 mm Hg. - Atrial septum: No defect or patent foramen ovale was identified. - Tricuspid valve: There was trivial regurgitation. - Pulmonary arteries: PA peak pressure: 9 mm Hg (S). - Pericardium, extracardiac: There was no pericardial effusion.   Impressions:   - Normal LV wall thickness with LVEF approximately 50%. There is   akinesis of the mid to basal inferolateral/inferior wall. Normal   diastolic function. Upper normal left atrial chamber size. Mildly   thickened mitral leaflets with trivial mitral regurgitation.   Mildly sclerotic aortic valve. Trivial tricuspid regurgitation.   08/2017 nuclear stress There was no ST segment deviation noted during stress. Findings consistent with large prior inferior/inferolateral myocardial infarction. This is an intermediate risk study. Risk based on decreased LVEF, there is no current myocardium at jeopardy. The left ventricular ejection fraction is mildly decreased (45%).       02/2021 cath CONCLUSIONS: Acute coronary syndrome with ostial 99% of circumflex progression.  First marginal 70%. 40 to 50% distal left main 40 to 50% mid LAD with 40% first diagonal Right coronary with diffuse 30 to 40% in-stent restenosis proximal and eccentric 50 to 60% mid to distal stenosis. LV systolic dysfunction with regional wall motion abnormality, and elevated LVEDP of 22 mmHg.  Findings are consistent with acute on chronic combined systolic and diastolic heart failure.   RECOMMENDATIONS:   IV Aggrastat IV heparin IV nitro Notify Dr. Cliffton Asters of patient's presence and requested consideration of surgical revascularization.  Could also discuss other treatment options which could include circumflex stent with extension into the left main  however it would gel the LAD.  Left main stenting would not be an optimal long-term strategy for this patient given his age of 52. Was not loaded with P2 Y 12 agent.     Assessment and Plan   1. CAD - chronic mild MSK pain at sternotomy site overall mild -no cardiac symptoms - continue current meds - EKG today SR, no acute ischemic changes   2. Hyperlipidemia -repeat lipid panel, goal LDL would be <55   3. HTN -he is at goal, continue current meds        Antoine Poche, M.D.

## 2023-04-11 ENCOUNTER — Other Ambulatory Visit: Payer: Self-pay | Admitting: Gastroenterology

## 2023-04-11 ENCOUNTER — Other Ambulatory Visit: Payer: Self-pay | Admitting: Cardiology

## 2023-04-11 ENCOUNTER — Other Ambulatory Visit: Payer: Self-pay | Admitting: Family Medicine

## 2023-04-14 ENCOUNTER — Encounter (HOSPITAL_COMMUNITY): Payer: Self-pay | Admitting: Psychiatry

## 2023-04-14 ENCOUNTER — Ambulatory Visit (INDEPENDENT_AMBULATORY_CARE_PROVIDER_SITE_OTHER): Payer: Medicare HMO | Admitting: Psychiatry

## 2023-04-14 VITALS — BP 155/103 | HR 70 | Ht 70.0 in | Wt 164.8 lb

## 2023-04-14 DIAGNOSIS — F5105 Insomnia due to other mental disorder: Secondary | ICD-10-CM

## 2023-04-14 DIAGNOSIS — F418 Other specified anxiety disorders: Secondary | ICD-10-CM

## 2023-04-14 MED ORDER — ALPRAZOLAM 1 MG PO TABS: 1.0000 mg | ORAL_TABLET | Freq: Three times a day (TID) | ORAL | 3 refills | Status: DC

## 2023-04-14 MED ORDER — FLUOXETINE HCL 40 MG PO CAPS: 40.0000 mg | ORAL_CAPSULE | Freq: Two times a day (BID) | ORAL | 3 refills | Status: DC

## 2023-04-14 MED ORDER — GABAPENTIN 300 MG PO CAPS: ORAL_CAPSULE | ORAL | 3 refills | Status: DC

## 2023-04-14 MED ORDER — OLANZAPINE 10 MG PO TABS: 10.0000 mg | ORAL_TABLET | Freq: Every day | ORAL | 3 refills | Status: DC

## 2023-04-14 NOTE — Progress Notes (Signed)
BH MD/PA/NP OP Progress Note  04/14/2023 10:13 AM [name] DENTINO  MRN:  [id number]  Chief Complaint:  Chief Complaint  Patient presents with   Depression   Anxiety   Follow-up   HPI: This patient is a 67 year old married white male lives with his wife and 2 grandchildren in [place].  He is on disability for coronary artery disease and COPD.  He used to work for [employer].  The patient returns after about 8 months regarding his depression and anxiety.  For the most part he is doing okay.  He is staying busy caring for his grandchildren.  This is the anniversary this time of year of his son's death so is a little bit difficult for him.  However he is "trying to keep going."  He denies significant depression anxiety thoughts of self-harm or suicidal ideation.  He is eating better and has gained about 10 pounds.  He denies any thoughts of wanting to harm himself.  He is sleeping okay most of the time. Visit Diagnosis:    ICD-10-CM   1. Depression with anxiety  F41.8     2. 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 s/p PTCA to LCx in 1993 b. BMS to RCA in 01/2012 with residual 80% OM2 stenosis and medical management recommended c. 08/2019: cath showing patent stents with occluded OM2 --> medical management. d. s/p CABG in 02/2021 with LIMA-LAD, reverse SVG-D1 and SVG-OM   Asthma    CAD (coronary artery disease)    Carcinoma in situ of colon 2004   rectal polyp   Colitis, ischemic (HCC) 2011   COPD (chronic obstructive pulmonary disease) (HCC)    mild ;excercise induced hypoxemia by cp stress test ;asthma ,bronchitis,   Depression    Diverticulosis    Gastritis 12/30/2010   EGD Dr Jena Gauss   GERD (gastroesophageal reflux disease)    GI bleed    H. pylori infection 2004    treated   Hemorrhoids    Hiatal hernia    Hyperlipidemia    Hypertension    Inflammatory polyps of colon with rectal bleeding (HCC)    Leukocytosis    Dr Shon Baton   Lung nodule 07/16/2011   Myocardial infarction [facility])    age 33   Restless leg syndrome    Schatzki's ring    Sleep apnea    does not use CPAP:cannot tolerate, PCP aware   Syncope    Tobacco abuse    50 pack years continuing at one halp pack daily   Tubular adenoma of colon 06/2009   Colonosocpy Dr Jena Gauss    Past Surgical History:  Procedure Laterality Date   BIOPSY  12/19/2017   Procedure: BIOPSY;  Surgeon: Corbin Ade, MD;  [facility];  Service: Endoscopy;;  gastric   BIOPSY  11/05/2021   Procedure: BIOPSY;  Surgeon: Corbin Ade, MD;  [facility];  Service: Endoscopy;;   CARDIAC CATHETERIZATION     CHOLECYSTECTOMY  2004   COLONOSCOPY  06/2009   normal terminal ileum, segmental mild inflammation of sigmoid colon (bx unremarkable), polyp, tubular adenoma   COLONOSCOPY N/A 07/31/2013   Dr.Rourk- redundant anal canal hemorrhoids, colonic diverticulosis, tubular adenoma   COLONOSCOPY W/ POLYPECTOMY  2004   rectal polyp with carcinoma in situ removed via colonoscopy   COLONOSCOPY WITH PROPOFOL N/A 09/15/2015   Dr. Jena Gauss: Scattered diverticula throughout the colon, 2 sessile polyps found in the descending colon and cecum, 5 mm in size.  Cecal polyp was sessile serrated polyp, descending colon polyp was a tubular adenoma.  He had a abnormal perianal exam along with grade 3 hemorrhoids.  Surveillance exam recommended for 5-year follow-up.   COLONOSCOPY WITH PROPOFOL N/A 12/19/2017   one 5 mm polyp in ascending colon and 1 cm sessile polyp in ascending s/p removal. Tubular adenoma. internal hemorrhoids   COLONOSCOPY WITH PROPOFOL N/A 11/05/2021   Procedure: COLONOSCOPY WITH PROPOFOL;  Surgeon: Corbin Ade, MD;  [facility];  Service: Endoscopy;  Laterality: N/A;  12:30pm    CORONARY ANGIOPLASTY WITH STENT PLACEMENT  01/26/2012   "1; total is now 2"   CORONARY ARTERY BYPASS GRAFT N/A 03/13/2021   Procedure: CORONARY ARTERY BYPASS GRAFTING (CABG), ON PUMP, TIMES THREE, USING LEFT INTERNAL MAMMARY ARTERY AND RIGHT ENDOSCOPICALLY HARVESTED GREATER SAPHENOUS VEIN;  Surgeon: Corliss Skains, MD;  [facility];  Service: Open Heart Surgery;  Laterality: N/A;   CORONARY/GRAFT ACUTE MI REVASCULARIZATION N/A 03/10/2021   Procedure: Coronary/Graft Acute MI Revascularization;  Surgeon: Lyn Records, MD;  Location: [facility] INVASIVE CV LAB;  Service: Cardiovascular;  Laterality: N/A;   ENDOVEIN HARVEST OF GREATER SAPHENOUS VEIN Right 03/13/2021   Procedure: ENDOVEIN HARVEST OF GREATER SAPHENOUS VEIN;  Surgeon: Corliss Skains, MD;  [facility];  Service: Open Heart Surgery;  Laterality: Right;   ESOPHAGOGASTRODUODENOSCOPY  02/2009   query Barrett's but bx negative   ESOPHAGOGASTRODUODENOSCOPY  12/30/2010   [name],gastritis, dilated 71F, sm HH, 1 small ulcer, Duodenal erosions, benign bx   ESOPHAGOGASTRODUODENOSCOPY (EGD) WITH ESOPHAGEAL DILATION N/A 09/12/2012   WUJ:[id number] Schatzki's ring s/p Maloney dilator. Small hiatal hernia. negative path   ESOPHAGOGASTRODUODENOSCOPY (EGD) WITH ESOPHAGEAL DILATION N/A 07/31/2013   Dr. Jena Gauss- normal egd, s/p [facility] dilation empirically. Normal small bowel biopsies    ESOPHAGOGASTRODUODENOSCOPY (EGD) WITH PROPOFOL N/A 09/15/2015   Dr. Jena Gauss: Medium sized hiatal hernia, normal-appearing esophagus status post empiric dilation   ESOPHAGOGASTRODUODENOSCOPY (EGD) WITH PROPOFOL N/A 12/19/2017   erosive esophagitis s/p dilation, erosive gastropathy, normal duodenum   ESOPHAGOGASTRODUODENOSCOPY (EGD) WITH PROPOFOL N/A 09/17/2019   Non-obstructing Schatzki's ring s/p dilation, medium-sized hiatal hernia, otherwise normal.   ESOPHAGOGASTRODUODENOSCOPY (EGD) WITH PROPOFOL N/A 11/05/2021   Procedure: ESOPHAGOGASTRODUODENOSCOPY (EGD) WITH  PROPOFOL;  Surgeon: Corbin Ade, MD;  [facility];  Service: Endoscopy;  Laterality: N/A;   FLEXIBLE SIGMOIDOSCOPY  12/30/2010    [name],; internal hemorrhoids, anal papilla   HAND SURGERY     surgical intervention for injury of the fingers of the left hand many years ago   heart stent     HEMORRHOID BANDING     Dr. Jena Gauss   LEFT HEART CATH AND CORONARY ANGIOGRAPHY N/A 08/29/2019   Procedure: LEFT HEART CATH AND CORONARY ANGIOGRAPHY;  Surgeon: Marykay Lex, MD;  Location: [facility] INVASIVE CV LAB;  Service: Cardiovascular;  Laterality: N/A;   LEFT HEART CATH AND CORONARY ANGIOGRAPHY N/A 03/10/2021   Procedure: LEFT HEART CATH AND CORONARY ANGIOGRAPHY;  Surgeon: Lyn Records, MD;  [facility];  Service: Cardiovascular;  Laterality: N/A;   LEFT HEART CATHETERIZATION WITH CORONARY ANGIOGRAM N/A 01/26/2012   Procedure: LEFT HEART CATHETERIZATION WITH CORONARY ANGIOGRAM;  Surgeon: Rollene Rotunda, MD;  Location: [facility]  CATH LAB;  Service: Cardiovascular;  Laterality: N/A;   MALONEY DILATION N/A 09/15/2015   Procedure: Elease Hashimoto DILATION;  Surgeon: Corbin Ade, MD;  [facility];  Service: Endoscopy;  Laterality: N/A;   MALONEY DILATION N/A 12/19/2017   Procedure: Elease Hashimoto DILATION;  Surgeon: Corbin Ade, MD;  [facility];  Service: Endoscopy;  Laterality: N/A;   MALONEY DILATION N/A 09/17/2019   Procedure: Elease Hashimoto DILATION;  Surgeon: Corbin Ade, MD;  [facility];  Service: Endoscopy;  Laterality: N/A;   MALONEY DILATION N/A 11/05/2021   Procedure: Elease Hashimoto DILATION;  Surgeon: Corbin Ade, MD;  [facility];  Service: Endoscopy;  Laterality: N/A;   NASAL SEPTOPLASTY W/ TURBINOPLASTY  10/06/2011   Procedure: NASAL SEPTOPLASTY WITH TURBINATE REDUCTION;  Surgeon: Serena Colonel, MD;  [facility];  Service: ENT;  Laterality: Bilateral;   PERCUTANEOUS CORONARY STENT INTERVENTION (PCI-S) N/A 01/26/2012   Procedure:  PERCUTANEOUS CORONARY STENT INTERVENTION (PCI-S);  Surgeon: Rollene Rotunda, MD;  Location[facility] CATH LAB;  Service: Cardiovascular;  Laterality: N/A;   POLYPECTOMY  09/15/2015   Procedure: POLYPECTOMY;  Surgeon: Corbin Ade, MD;  [facility];  Service: Endoscopy;;  Cecal polyp removed via cold snare/ Descending colon polyp removed via cold snare   POLYPECTOMY  12/19/2017   Procedure: POLYPECTOMY;  Surgeon: Corbin Ade, MD;  [facility];  Service: Endoscopy;;  colon   TEE WITHOUT CARDIOVERSION N/A 03/13/2021   Procedure: TRANSESOPHAGEAL ECHOCARDIOGRAM (TEE);  Surgeon: Corliss Skains, MD;  Location: [facility] OR;  Service: Open Heart Surgery;  Laterality: N/A;    Family Psychiatric History: See below  Family History:  Family History  Problem Relation Age of Onset   Lung disease Father        deceased, black lung   Heart disease Mother        blood clots   Depression Mother    Cancer Paternal Uncle        unknown type   Cancer Maternal Aunt        unknown type   Kidney failure Maternal Uncle    Hypertension Brother    Colon cancer Neg Hx    ADD / ADHD Neg Hx    Alcohol abuse Neg Hx    Drug abuse Neg Hx    Anxiety disorder Neg Hx    Bipolar disorder Neg Hx    Dementia Neg Hx    OCD Neg Hx    Paranoid behavior Neg Hx    Schizophrenia Neg Hx    Physical abuse Neg Hx    Sexual abuse Neg Hx    Seizures Neg Hx     Social History:  Social History   Socioeconomic History   Marital status: Married    Spouse name: Not on file   Number of children: 3   Years of education: Not on file   Highest education level: Not on file  Occupational History   Occupation: disable    Employer: RETIRED    Comment: [employer]  Tobacco Use   Smoking status: Every Day    Current packs/day: 0.00    Average packs/day: 1 pack/day for 51.6 years (51.6 ttl pk-yrs)    Types: Cigarettes    Start date: 07/31/1969    Last attempt to quit: 03/16/2021    Years since quitting: 2.0    Smokeless tobacco: Former  Building services engineer status: Never Used  Substance and Sexual Activity  Alcohol use: Yes    Comment: Drinks a beer occasionally   Drug use: No   Sexual activity: Not Currently  Other Topics Concern   Not on file  Social History Narrative   3 stepchildren   Social Determinants of Health   Financial Resource Strain: Not on file  Food Insecurity: Not on file  Transportation Needs: Not on file  Physical Activity: Not on file  Stress: Not on file  Social Connections: Not on file    Allergies: No Known Allergies  Metabolic Disorder Labs: Lab Results  Component Value Date   HGBA1C 5.3 07/15/2022   MPG 94 03/11/2021   MPG 103 10/29/2013   No results found for: "PROLACTIN" Lab Results  Component Value Date   CHOL 131 07/15/2022   TRIG 137 07/15/2022   HDL 32 (L) 07/15/2022   CHOLHDL 4.1 07/15/2022   VLDL 16 03/11/2021   LDLCALC 75 07/15/2022   LDLCALC 96 07/21/2021   Lab Results  Component Value Date   TSH 1.154 03/11/2021   TSH 1.011 02/09/2010    Therapeutic Level Labs: No results found for: "LITHIUM" No results found for: "VALPROATE" No results found for: "CBMZ"  Current Medications: Current Outpatient Medications  Medication Sig Dispense Refill   albuterol (VENTOLIN HFA) 108 (90 Base) MCG/ACT inhaler Inhale 2 puffs into the lungs every 4 (four) hours as needed for wheezing or shortness of breath. 18 g 1   amLODipine (NORVASC) 5 MG tablet TAKE 1 TABLET EVERY DAY 90 tablet 3   aspirin EC 81 MG tablet Take 81 mg by mouth daily.     atorvastatin (LIPITOR) 80 MG tablet TAKE 1 TABLET EVERY DAY 90 tablet 3   Cyanocobalamin (VITAMIN B-12 PO) Take 1 tablet by mouth in the morning.     doxycycline (VIBRA-TABS) 100 MG tablet Take 1 tablet (100 mg total) by mouth 2 (two) times daily. 14 tablet 0   HYDROcodone-acetaminophen (NORCO/VICODIN) 5-325 MG tablet TAKE ONE TABLET TWICE DAILY AS NEEDED FOR PAIN to last 30 days 45 tablet 0   isosorbide  mononitrate (IMDUR) 30 MG 24 hr tablet Take 30 mg by mouth daily.     lipase/protease/amylase (CREON) 36000 UNITS CPEP capsule Take 2 capsules (72,000 Units total) by mouth 3 (three) times daily with meals. May also take 1 capsule (36,000 Units total) as needed (with snacks). 240 capsule 3   meloxicam (MOBIC) 15 MG tablet TAKE 1 TABLET EVERY DAY 90 tablet 1   metoprolol succinate (TOPROL-XL) 50 MG 24 hr tablet TAKE 1 TABLET EVERY DAY 90 tablet 3   mupirocin ointment (BACTROBAN) 2 % Apply thin amount bid to knee wound for 10 days 15 g 2   nitroGLYCERIN (NITROSTAT) 0.4 MG SL tablet Place 1 tablet (0.4 mg total) under the tongue every 5 (five) minutes as needed for chest pain. 20 tablet 5   pantoprazole (PROTONIX) 40 MG tablet TAKE 1 TABLET TWICE DAILY BEFORE MEALS 180 tablet 3   polyethylene glycol (MIRALAX / GLYCOLAX) 17 g packet Take 17 g by mouth 2 (two) times daily. 180 packet 0   psyllium (METAMUCIL) 58.6 % packet Take 1 packet by mouth 2 (two) times daily. 60 packet 2   rOPINIRole (REQUIP) 2 MG tablet TAKE 1 TABLET AT BEDTIME 90 tablet 3   tamsulosin (FLOMAX) 0.4 MG CAPS capsule TAKE 1 CAPSULE EVERY DAY 90 capsule 3   traZODone (DESYREL) 50 MG tablet TAKE 1 TABLET AT BEDTIME 90 tablet 3   TRELEGY ELLIPTA 100-62.5-25 MCG/ACT AEPB INHALE  1 PUFF INTO THE LUNGS DAILY. 180 each 3   valsartan (DIOVAN) 80 MG tablet TAKE 1 TABLET EVERY DAY 90 tablet 3   ALPRAZolam (XANAX) 1 MG tablet Take 1 tablet (1 mg total) by mouth 3 (three) times daily. 90 tablet 3   FLUoxetine (PROZAC) 40 MG capsule Take 1 capsule (40 mg total) by mouth 2 (two) times daily. 180 capsule 3   gabapentin (NEURONTIN) 300 MG capsule Take two tablets three times a day 540 capsule 3   OLANZapine (ZYPREXA) 10 MG tablet Take 1 tablet (10 mg total) by mouth at bedtime. 90 tablet 3   potassium chloride (KLOR-CON M) 10 MEQ tablet Take 1 tablet (10 mEq total) by mouth daily for 5 doses. 5 tablet 0   No current facility-administered  medications for this visit.     Musculoskeletal: Strength & Muscle Tone: within normal limits Gait & Station: normal Patient leans: N/A  Psychiatric Specialty Exam: Review of Systems  Constitutional:  Positive for fatigue.  Gastrointestinal:  Positive for blood in stool.  Musculoskeletal:  Positive for arthralgias.  All other systems reviewed and are negative.   Blood pressure (!) 155/103, pulse 70, height 5\' 10"  (1.778 m), weight 164 lb 12.8 oz (74.8 kg), SpO2 96%.Body mass index is 23.65 kg/m.  General Appearance: Casual and Fairly Groomed  Eye Contact:  Good  Speech:  Clear and Coherent  Volume:  Normal  Mood:  Euthymic  Affect:  Congruent  Thought Process:  Goal Directed  Orientation:  Full (Time, Place, and Person)  Thought Content: Rumination   Suicidal Thoughts:  No  Homicidal Thoughts:  No  Memory:  Immediate;   Good Recent;   Good Remote;   Fair  Judgement:  Good  Insight:  Fair  Psychomotor Activity:  Decreased  Concentration:  Concentration: Fair and Attention Span: Good  Recall:  Fair  Fund of Knowledge: Good  Language: Good  Akathisia:  No  Handed:  Right  AIMS (if indicated): not done  Assets:  Communication Skills Desire for Improvement Resilience Social Support  ADL's:  Intact  Cognition: WNL  Sleep:  Fair   Screenings: GAD-7    Garment/textile technologist Visit from 04/14/2023 in [facility] [place] Office Visit from 03/18/2023 in [facility] [place] [facility] Office Visit from 11/22/2022 in [facility] [place] [facility] Office Visit from 08/12/2022 in [facility] [name] Office Visit from 06/30/2022 in [facility] [place] [facility]  Total GAD-7 Score 7 3 4 9 7       PHQ2-9    Flowsheet Row Office Visit from 04/14/2023 in [place] [facility] [place] Office Visit from 03/18/2023 in [facility] [place] [facility] Office Visit  from 11/22/2022 in [facility] [place] [facility] Office Visit from 08/12/2022 in [facility] [place] Office Visit from 06/30/2022 in [facility] [place] [facility]  PHQ-2 Total Score 2 2 2  0 2  PHQ-9 Total Score 11 9 8 4 6       Flowsheet Row Office Visit from 08/12/2022 in [place] [facility] [place] Visit from 02/17/2022 in [facility] [place] ED from 01/30/2022 in [facility] Department at [name]  C-SSRS RISK CATEGORY No Risk No Risk No Risk        Assessment and Plan: This patient is a 67 year old male with a history depression anxiety and PTSD regarding both of his  children's deaths.  However he seems to be stable on his current regimen.  He will continue Prozac 80 mg daily for depression, Xanax 1 mg 3 times daily for anxiety, gabapentin 600 mg twice daily for anxiety and olanzapine 10 mg at bedtime for mood stabilization.  He will return to see me in 6 months  Collaboration of Care: Collaboration of Care: Primary Care Provider AEB notes are shared with PCP on the epic system  Patient/Guardian was advised Release of Information must be obtained prior to any record release in order to collaborate their care with an outside provider. Patient/Guardian was advised if they have not already done so to contact the registration department to sign all necessary forms in order for [place] to release information regarding their care.   Consent: Patient/Guardian gives verbal consent for treatment and assignment of benefits for services provided during this visit. Patient/Guardian expressed understanding and agreed to proceed.    Diannia Ruder, MD 04/14/2023, 10:13 AM

## 2023-04-21 ENCOUNTER — Emergency Department (HOSPITAL_COMMUNITY): Payer: Medicare HMO

## 2023-04-21 ENCOUNTER — Encounter (HOSPITAL_COMMUNITY): Payer: Self-pay | Admitting: Radiology

## 2023-04-21 ENCOUNTER — Other Ambulatory Visit: Payer: Self-pay

## 2023-04-21 ENCOUNTER — Emergency Department (HOSPITAL_COMMUNITY)
Admission: EM | Admit: 2023-04-21 | Discharge: 2023-04-22 | Disposition: A | Discharge status: Home / Self Care | Payer: Medicare HMO | Attending: Emergency Medicine | Admitting: Emergency Medicine

## 2023-04-21 DIAGNOSIS — Z7951 Long term (current) use of inhaled steroids: Secondary | ICD-10-CM | POA: Diagnosis not present

## 2023-04-21 DIAGNOSIS — I2510 Atherosclerotic heart disease of native coronary artery without angina pectoris: Secondary | ICD-10-CM | POA: Diagnosis not present

## 2023-04-21 DIAGNOSIS — Z85038 Personal history of other malignant neoplasm of large intestine: Secondary | ICD-10-CM | POA: Insufficient documentation

## 2023-04-21 DIAGNOSIS — K649 Unspecified hemorrhoids: Secondary | ICD-10-CM

## 2023-04-21 DIAGNOSIS — I10 Essential (primary) hypertension: Secondary | ICD-10-CM | POA: Diagnosis not present

## 2023-04-21 DIAGNOSIS — Z7982 Long term (current) use of aspirin: Secondary | ICD-10-CM | POA: Insufficient documentation

## 2023-04-21 DIAGNOSIS — R42 Dizziness and giddiness: Secondary | ICD-10-CM | POA: Diagnosis not present

## 2023-04-21 DIAGNOSIS — J449 Chronic obstructive pulmonary disease, unspecified: Secondary | ICD-10-CM | POA: Insufficient documentation

## 2023-04-21 DIAGNOSIS — I6782 Cerebral ischemia: Secondary | ICD-10-CM | POA: Diagnosis not present

## 2023-04-21 DIAGNOSIS — Z79899 Other long term (current) drug therapy: Secondary | ICD-10-CM | POA: Diagnosis not present

## 2023-04-21 DIAGNOSIS — K644 Residual hemorrhoidal skin tags: Secondary | ICD-10-CM | POA: Insufficient documentation

## 2023-04-21 DIAGNOSIS — W01198A Fall on same level from slipping, tripping and stumbling with subsequent striking against other object, initial encounter: Secondary | ICD-10-CM | POA: Insufficient documentation

## 2023-04-21 LAB — CBC
HCT: 45.9 % (ref 39.0–52.0)
Hemoglobin: 15.8 g/dL (ref 13.0–17.0)
MCH: 32.2 pg (ref 26.0–34.0)
MCHC: 34.4 g/dL (ref 30.0–36.0)
MCV: 93.7 fL (ref 80.0–100.0)
Platelets: 179 10*3/uL (ref 150–400)
RBC: 4.9 MIL/uL (ref 4.22–5.81)
RDW: 13.2 % (ref 11.5–15.5)
WBC: 6.9 10*3/uL (ref 4.0–10.5)
nRBC: 0 % (ref 0.0–0.2)

## 2023-04-21 LAB — BASIC METABOLIC PANEL
Anion gap: 10 (ref 5–15)
BUN: 12 mg/dL (ref 8–23)
CO2: 23 mmol/L (ref 22–32)
Calcium: 8.7 mg/dL — ABNORMAL LOW (ref 8.9–10.3)
Chloride: 107 mmol/L (ref 98–111)
Creatinine, Ser: 0.86 mg/dL (ref 0.61–1.24)
GFR, Estimated: >60 mL/min (ref >60)
Glucose, Bld: 96 mg/dL (ref 70–99)
Potassium: 3.3 mmol/L — ABNORMAL LOW (ref 3.5–5.1)
Sodium: 140 mmol/L (ref 135–145)

## 2023-04-21 LAB — POC OCCULT BLOOD, ED: Fecal Occult Bld: NEGATIVE

## 2023-04-21 LAB — CBG MONITORING, ED: Glucose-Capillary: 108 mg/dL — ABNORMAL HIGH (ref 70–99)

## 2023-04-21 MED ORDER — HYDROCORTISONE ACETATE 25 MG RE SUPP: 25.0000 mg | Freq: Two times a day (BID) | RECTAL | 0 refills | Status: DC

## 2023-04-21 MED ORDER — MECLIZINE HCL 12.5 MG PO TABS
25.0000 mg | ORAL_TABLET | Freq: Once | ORAL | Status: AC
  Administered 2023-04-21: 25 mg via ORAL
  Filled 2023-04-21: qty 2

## 2023-04-21 MED ORDER — SODIUM CHLORIDE 0.9 % IV BOLUS
1000.0000 mL | Freq: Once | INTRAVENOUS | Status: AC
  Administered 2023-04-21: 1000 mL via INTRAVENOUS

## 2023-04-21 NOTE — ED Triage Notes (Signed)
Pt got dizzy around an hour prior to arrival and fell face first into the refrigerator. Pt states he is unable to get his balance. Pt states he is so dizzy that he is nauseous. Denies chest pain or shortness or breath. Denies chest pain or shortness of breath.

## 2023-04-21 NOTE — ED Notes (Signed)
CBG 108. 

## 2023-04-21 NOTE — ED Provider Notes (Signed)
[place] EMERGENCY DEPARTMENT AT [facility] Provider Note   CSN: [id number] Arrival date & time: 04/21/23  1858     History  Chief Complaint  Patient presents with   Dizziness   Fall   Rectal Bleeding    [name] is a 67 y.o. male.  Pt is a 67 yo male with pmhx significant for cad, htn, hld, cope, gerd, rls, colon cancer, gastritis, and sleep apnea.  Pt said he felt dizzy today like the room was spinning.  Pt stumbled in his kitchen and hit his head.  The dizziness has improved now.  Pt also reports some brbpr.  Pt did see GI for the rectal bleeding on 10/9 for the same.        Home Medications Prior to Admission medications   Medication Sig Start Date End Date Taking? Authorizing Provider  hydrocortisone (ANUSOL-HC) 25 MG suppository Place 1 suppository (25 mg total) rectally 2 (two) times daily. 04/21/23  Yes Jacalyn Lefevre, MD  meclizine (ANTIVERT) 25 MG tablet Take 1 tablet (25 mg total) by mouth 3 (three) times daily as needed for dizziness. 04/22/23  Yes Jacalyn Lefevre, MD  albuterol (VENTOLIN HFA) 108 (90 Base) MCG/ACT inhaler Inhale 2 puffs into the lungs every 4 (four) hours as needed for wheezing or shortness of breath. 10/30/19   Nyoka Cowden, MD  ALPRAZolam Prudy Feeler) 1 MG tablet Take 1 tablet (1 mg total) by mouth 3 (three) times daily. 04/14/23   Myrlene Broker, MD  amLODipine (NORVASC) 5 MG tablet TAKE 1 TABLET EVERY DAY 01/28/23   Babs Sciara, MD  aspirin EC 81 MG tablet Take 81 mg by mouth daily.    [provider]  atorvastatin (LIPITOR) 80 MG tablet TAKE 1 TABLET EVERY DAY 04/11/23   Antoine Poche, MD  Cyanocobalamin (VITAMIN B-12 PO) Take 1 tablet by mouth in the morning.    [provider]  doxycycline (VIBRA-TABS) 100 MG tablet Take 1 tablet (100 mg total) by mouth 2 (two) times daily. 03/18/23   Babs Sciara, MD  FLUoxetine (PROZAC) 40 MG capsule Take 1 capsule (40 mg total) by mouth 2 (two) times daily.  04/14/23   Myrlene Broker, MD  gabapentin (NEURONTIN) 300 MG capsule Take two tablets three times a day 04/14/23   Myrlene Broker, MD  HYDROcodone-acetaminophen (NORCO/VICODIN) 5-325 MG tablet TAKE ONE TABLET TWICE DAILY AS NEEDED FOR PAIN to last 30 days 03/18/23   Babs Sciara, MD  isosorbide mononitrate (IMDUR) 30 MG 24 hr tablet Take 30 mg by mouth daily. 04/16/21   [provider]  lipase/protease/amylase (CREON) 36000 UNITS CPEP capsule Take 2 capsules (72,000 Units total) by mouth 3 (three) times daily with meals. May also take 1 capsule (36,000 Units total) as needed (with snacks). 08/24/21   Corbin Ade, MD  meloxicam (MOBIC) 15 MG tablet TAKE 1 TABLET EVERY DAY 04/12/23   Babs Sciara, MD  metoprolol succinate (TOPROL-XL) 50 MG 24 hr tablet TAKE 1 TABLET EVERY DAY 04/11/23   Antoine Poche, MD  mupirocin ointment (BACTROBAN) 2 % Apply thin amount bid to knee wound for 10 days 03/18/23   Babs Sciara, MD  nitroGLYCERIN (NITROSTAT) 0.4 MG SL tablet Place 1 tablet (0.4 mg total) under the tongue every 5 (five) minutes as needed for chest pain. 10/06/20   Babs Sciara, MD  OLANZapine (ZYPREXA) 10 MG tablet Take 1 tablet (10 mg total) by mouth at bedtime.  04/14/23   Myrlene Broker, MD  pantoprazole (PROTONIX) 40 MG tablet TAKE 1 TABLET TWICE DAILY BEFORE MEALS 04/11/23   Tiffany Kocher, PA-C  polyethylene glycol (MIRALAX / GLYCOLAX) 17 g packet Take 17 g by mouth 2 (two) times daily. 03/23/23 06/21/23  Franky Macho, MD  potassium chloride (KLOR-CON M) 10 MEQ tablet Take 1 tablet (10 mEq total) by mouth daily for 5 doses. 03/23/23 03/28/23  Babs Sciara, MD  psyllium (METAMUCIL) 58.6 % packet Take 1 packet by mouth 2 (two) times daily. 03/23/23 06/21/23  Franky Macho, MD  rOPINIRole (REQUIP) 2 MG tablet TAKE 1 TABLET AT BEDTIME 01/28/23   Babs Sciara, MD  tamsulosin (FLOMAX) 0.4 MG CAPS capsule TAKE 1 CAPSULE EVERY DAY 01/28/23   Babs Sciara, MD  traZODone  (DESYREL) 50 MG tablet TAKE 1 TABLET AT BEDTIME 10/18/22   Myrlene Broker, MD  TRELEGY ELLIPTA 100-62.5-25 MCG/ACT AEPB INHALE 1 PUFF INTO THE LUNGS DAILY. 08/27/22   Glenford Bayley, NP  valsartan (DIOVAN) 80 MG tablet TAKE 1 TABLET EVERY DAY 10/18/22   Babs Sciara, MD      Allergies    Patient has no known allergies.    Review of Systems   Review of Systems  Gastrointestinal:        Rectal bleeding  Neurological:  Positive for dizziness.  All other systems reviewed and are negative.   Physical Exam Updated Vital Signs BP (!) 137/97 (BP Location: Right Arm)   Pulse 66   Temp 97.9 F (36.6 C) (Oral)   Resp 17   Ht 5\' 10"  (1.778 m)   Wt 74.4 kg   SpO2 95%   BMI 23.53 kg/m  Physical Exam Vitals and nursing note reviewed. Exam conducted with a chaperone present.  Constitutional:      Appearance: Normal appearance.  HENT:     Head: Normocephalic and atraumatic.     Right Ear: External ear normal.     Left Ear: External ear normal.     Nose: Nose normal.     Mouth/Throat:     Mouth: Mucous membranes are moist.     Pharynx: Oropharynx is clear.  Eyes:     Extraocular Movements: Extraocular movements intact.     Conjunctiva/sclera: Conjunctivae normal.     Pupils: Pupils are equal, round, and reactive to light.  Cardiovascular:     Rate and Rhythm: Normal rate and regular rhythm.     Pulses: Normal pulses.     Heart sounds: Normal heart sounds.  Pulmonary:     Effort: Pulmonary effort is normal.     Breath sounds: Normal breath sounds.  Abdominal:     General: Abdomen is flat. Bowel sounds are normal.     Palpations: Abdomen is soft.  Genitourinary:    Rectum: Guaiac result negative. External hemorrhoid present.  Musculoskeletal:        General: Normal range of motion.     Cervical back: Normal range of motion and neck supple.  Skin:    General: Skin is warm.     Capillary Refill: Capillary refill takes less than 2 seconds.  Neurological:     General: No  focal deficit present.     Mental Status: He is alert and oriented to person, place, and time.  Psychiatric:        Mood and Affect: Mood normal.        Behavior: Behavior normal.     ED Results /  Procedures / Treatments   Labs (all labs ordered are listed, but only abnormal results are displayed) Labs Reviewed  BASIC METABOLIC PANEL - Abnormal; Notable for the following components:      Result Value   Potassium 3.3 (*)    Calcium 8.7 (*)    All other components within normal limits  CBG MONITORING, ED - Abnormal; Notable for the following components:   Glucose-Capillary 108 (*)    All other components within normal limits  CBC  URINALYSIS, ROUTINE W REFLEX MICROSCOPIC  POC OCCULT BLOOD, ED    EKG None  Radiology CT Head Wo Contrast  Result Date: 04/22/2023 CLINICAL DATA:  Fall EXAM: CT HEAD WITHOUT CONTRAST TECHNIQUE: Contiguous axial images were obtained from the base of the skull through the vertex without intravenous contrast. RADIATION DOSE REDUCTION: This exam was performed according to the departmental dose-optimization program which includes automated exposure control, adjustment of the mA and/or kV according to patient size and/or use of iterative reconstruction technique. COMPARISON:  None Available. FINDINGS: Brain: There is no mass, hemorrhage or extra-axial collection. Normal appearance of the white matter with preserved gray-white differentiation. Normal CSF spaces. Vascular: No mass, hemorrhage or extra-axial collection. There is generalized volume loss. There is hypoattenuation of the bilateral supratentorial white matter. Skull: Negative Sinuses/Orbits: Left maxillary sinus mucosal thickening. Normal orbits. Other: None IMPRESSION: 1. No acute intracranial abnormality. 2. Generalized volume loss and findings of chronic small vessel ischemia. Electronically Signed   By: Deatra Robinson M.D.   On: 04/22/2023 00:09    Procedures Procedures    Medications Ordered in  ED Medications  sodium chloride 0.9 % bolus 1,000 mL (0 mLs Intravenous Stopped 04/22/23 0036)  meclizine (ANTIVERT) tablet 25 mg (25 mg Oral Given 04/21/23 2223)    ED Course/ Medical Decision Making/ A&P                                 Medical Decision Making Amount and/or Complexity of Data Reviewed Labs: ordered. Radiology: ordered.  Risk Prescription drug management.   This patient presents to the ED for concern of dizziness, this involves an extensive number of treatment options, and is a complaint that carries with it a high risk of complications and morbidity.  The differential diagnosis includes vertigo, cva   Co morbidities that complicate the patient evaluation  ad, htn, hld, cope, gerd, rls, colon cancer, gastritis, and sleep apnea   Additional history obtained:  Additional history obtained from epic chart review External records from outside source obtained and reviewed including family   Lab Tests:  I Ordered, and personally interpreted labs.  The pertinent results include:  cbc nl, bmp nl   Imaging Studies ordered:  I ordered imaging studies including ct head  I independently visualized and interpreted imaging which showed   No acute intracranial abnormality.  2. Generalized volume loss and findings of chronic small vessel  ischemia.   I agree with the radiologist interpretation   Cardiac Monitoring:  The patient was maintained on a cardiac monitor.  I personally viewed and interpreted the cardiac monitored which showed an underlying rhythm of: nsr   Medicines ordered and prescription drug management:  I ordered medication including antivert/ivfs  for sx  Reevaluation of the patient after these medicines showed that the patient improved I have reviewed the patients home medicines and have made adjustments as needed   Test Considered:  ct   Problem List /  ED Course:  BRBPR:  likely hemorrhoidal.  Pt to f/u with GI Dizziness:  improved.   Likely vertigo.  Pt feeling better.  He is stable for d/c.  Return if worse.    Reevaluation:  After the interventions noted above, I reevaluated the patient and found that they have :improved   Social Determinants of Health:  Lives at home   Dispostion:  After consideration of the diagnostic results and the patients response to treatment, I feel that the patent would benefit from discharge with outpatient f/u.          Final Clinical Impression(s) / ED Diagnoses Final diagnoses:  Hemorrhoids, unspecified hemorrhoid type  Vertigo    Rx / DC Orders ED Discharge Orders          Ordered    meclizine (ANTIVERT) 25 MG tablet  3 times daily PRN        04/22/23 0036    hydrocortisone (ANUSOL-HC) 25 MG suppository  2 times daily        04/21/23 2332              Jacalyn Lefevre, MD 04/22/23 [phone]

## 2023-04-21 NOTE — ED Triage Notes (Signed)
Family member came in room and made me aware that the patient has been having bright red bloody stools for the past 3 weeks.

## 2023-04-22 MED ORDER — MECLIZINE HCL 25 MG PO TABS
25.0000 mg | ORAL_TABLET | Freq: Three times a day (TID) | ORAL | 0 refills | Status: DC | PRN
Start: 2023-04-22 — End: 2023-09-21

## 2023-05-05 ENCOUNTER — Telehealth: Payer: Self-pay | Admitting: *Deleted

## 2023-05-05 NOTE — Telephone Encounter (Signed)
Transition Care Management Unsuccessful Follow-up Telephone Call  Date of discharge and from where:  [facility]  04/22/2023  Attempts:  1st Attempt  Reason for unsuccessful TCM follow-up call:  No answer/busy

## 2023-05-16 ENCOUNTER — Other Ambulatory Visit: Payer: Self-pay

## 2023-05-16 DIAGNOSIS — Z87891 Personal history of nicotine dependence: Secondary | ICD-10-CM

## 2023-05-16 DIAGNOSIS — F17210 Nicotine dependence, cigarettes, uncomplicated: Secondary | ICD-10-CM

## 2023-05-16 DIAGNOSIS — Z122 Encounter for screening for malignant neoplasm of respiratory organs: Secondary | ICD-10-CM

## 2023-05-17 ENCOUNTER — Encounter (INDEPENDENT_AMBULATORY_CARE_PROVIDER_SITE_OTHER): Payer: Self-pay | Admitting: Gastroenterology

## 2023-05-17 ENCOUNTER — Ambulatory Visit (INDEPENDENT_AMBULATORY_CARE_PROVIDER_SITE_OTHER): Payer: Medicare HMO | Admitting: Gastroenterology

## 2023-05-17 VITALS — BP 132/89 | HR 70 | Temp 97.6°F | Ht 70.0 in | Wt 167.6 lb

## 2023-05-17 DIAGNOSIS — K641 Second degree hemorrhoids: Secondary | ICD-10-CM

## 2023-05-17 DIAGNOSIS — R1319 Other dysphagia: Secondary | ICD-10-CM

## 2023-05-17 DIAGNOSIS — K625 Hemorrhage of anus and rectum: Secondary | ICD-10-CM | POA: Diagnosis not present

## 2023-05-17 DIAGNOSIS — K219 Gastro-esophageal reflux disease without esophagitis: Secondary | ICD-10-CM

## 2023-05-17 MED ORDER — PEG 3350-KCL-NA BICARB-NACL 420 G PO SOLR: 4000.0000 mL | Freq: Once | ORAL | 0 refills | Status: AC

## 2023-05-17 NOTE — Patient Instructions (Signed)
It was very nice to meet you today, as dicussed with will plan for the following :  1) Colonoscopy

## 2023-05-17 NOTE — Progress Notes (Signed)
[name] , M.D. Gastroenterology & Hepatology [facility] Gastroenterology [address] [place], [place] [address] Primary Care Physician: Babs Sciara, MD [address] [place] [address]  Chief Complaint:  hematochezia   History of Present Illness:  [name] is a 67 y.o. male with multiple comorbidities including CAD status post three-vessel CABG due to STEMI, history of colon cancer in situ who presents for evaluation of Painless hematochezia .  This is patient's second for the same complaint which is painless hematochezia and patient is very concerned.  Patient was last seen 2 months ago where he was noticing fresh blood.  He continues to see blood in the stool and reports it is pouring out rather happens 4 to 5 days a week mixed with stool. No nocturnal symptoms.  Patient denies any dizziness chest pain or shortness of breath.  Patient reports that he has 1 soft bowel movement daily but sometimes he has to strain.  Denies any abdominal pain  Last [id number]  - Normal esophagus. Dilated. - Small hiatal hernia. - Normal duodenal bulb and second portion of the duodenum. Except for a duodenal polyp/ removed as described above  Last Colonoscopy:10/2021 One 3 mm polyp in the distal rectum, removed with a cold biopsy forceps. Resected and retrieved. - Non- bleeding internal hemorrhoids. Normal- appearing terminal ileum. - The examination was otherwise normal on direct and retroflexion views. Status post segmental biopsy  A. DUODENUM POLYP, BIOPSY:  Reactive duodenal mucosa with lymphoid aggregate  Negative for dysplasia and gastric metaplasia   B. ASCENDING COLON, BIOPSY:  Benign colonic mucosa with no diagnostic abnormality   C. DESCENDING COLON, BIOPSY:  Benign colonic mucosa with no diagnostic abnormality   D. RECTUM, POLYPECTOMY:  Hyperplastic polyp  Negative for dysplasia and carcinoma   Past Medical History: Past  Medical History:  Diagnosis Date   Anxiety    Anxiety and depression    ASCVD (arteriosclerotic cardiovascular disease)    a. s/p PTCA to LCx in 1993 b. BMS to RCA in 01/2012 with residual 80% OM2 stenosis and medical management recommended c. 08/2019: cath showing patent stents with occluded OM2 --> medical management. d. s/p CABG in 02/2021 with LIMA-LAD, reverse SVG-D1 and SVG-OM   Asthma    CAD (coronary artery disease)    Carcinoma in situ of colon 2004   rectal polyp   Colitis, ischemic (HCC) 2011   COPD (chronic obstructive pulmonary disease) (HCC)    mild ;excercise induced hypoxemia by cp stress test ;asthma ,bronchitis,   Depression    Diverticulosis    Gastritis 12/30/2010   EGD Dr Jena Gauss   GERD (gastroesophageal reflux disease)    GI bleed    H. 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)    Leukocytosis    Dr Shon Baton   Lung nodule 07/16/2011   Myocardial infarction [facility])    age 52   Restless leg syndrome    Schatzki's ring    Sleep apnea    does not use CPAP:cannot tolerate, PCP aware   Syncope    Tobacco abuse    50 pack years continuing at one halp pack daily   Tubular adenoma of colon 06/2009   Colonosocpy Dr Jena Gauss    Past Surgical History: Past Surgical History:  Procedure Laterality Date   BIOPSY  12/19/2017   Procedure: BIOPSY;  Surgeon: Corbin Ade, MD;  [facility];  Service: Endoscopy;;  gastric   BIOPSY  11/05/2021   Procedure: BIOPSY;  Surgeon: Corbin Ade, MD;  [facility];  Service: Endoscopy;;   CARDIAC CATHETERIZATION     CHOLECYSTECTOMY  2004   COLONOSCOPY  06/2009   normal terminal ileum, segmental mild inflammation of sigmoid colon (bx unremarkable), polyp, tubular adenoma   COLONOSCOPY N/A 07/31/2013   Dr.Rourk- redundant anal canal hemorrhoids, colonic diverticulosis, tubular adenoma    COLONOSCOPY W/ POLYPECTOMY  2004   rectal polyp with carcinoma in situ removed via colonoscopy   COLONOSCOPY WITH PROPOFOL N/A 09/15/2015   Dr. Jena Gauss: Scattered diverticula throughout the colon, 2 sessile polyps found in the descending colon and cecum, 5 mm in size.  Cecal polyp was sessile serrated polyp, descending colon polyp was a tubular adenoma.  He had a abnormal perianal exam along with grade 3 hemorrhoids.  Surveillance exam recommended for 5-year follow-up.   COLONOSCOPY WITH PROPOFOL N/A 12/19/2017   one 5 mm polyp in ascending colon and 1 cm sessile polyp in ascending s/p removal. Tubular adenoma. internal hemorrhoids   COLONOSCOPY WITH PROPOFOL N/A 11/05/2021   Procedure: COLONOSCOPY WITH PROPOFOL;  Surgeon: Corbin Ade, MD;  [facility];  Service: Endoscopy;  Laterality: N/A;  12:30pm   CORONARY ANGIOPLASTY WITH STENT PLACEMENT  01/26/2012   "1; total is now 2"   CORONARY ARTERY BYPASS GRAFT N/A 03/13/2021   Procedure: CORONARY ARTERY BYPASS GRAFTING (CABG), ON PUMP, TIMES THREE, USING LEFT INTERNAL MAMMARY ARTERY AND RIGHT ENDOSCOPICALLY HARVESTED GREATER SAPHENOUS VEIN;  Surgeon: Corliss Skains, MD;  Location: [facility] OR;  Service: Open Heart Surgery;  Laterality: N/A;   CORONARY/GRAFT ACUTE MI REVASCULARIZATION N/A 03/10/2021   Procedure: Coronary/Graft Acute MI Revascularization;  Surgeon: Lyn Records, MD;  Location: [facility] INVASIVE CV LAB;  Service: Cardiovascular;  Laterality: N/A;   ENDOVEIN HARVEST OF GREATER SAPHENOUS VEIN Right 03/13/2021   Procedure: ENDOVEIN HARVEST OF GREATER SAPHENOUS VEIN;  Surgeon: Corliss Skains, MD;  Location: [facility] OR;  Service: Open Heart Surgery;  Laterality: Right;   ESOPHAGOGASTRODUODENOSCOPY  02/2009   query Barrett's but bx negative   ESOPHAGOGASTRODUODENOSCOPY  12/30/2010   [name],gastritis, dilated 106F, sm HH, 1 small ulcer, Duodenal erosions, benign bx   ESOPHAGOGASTRODUODENOSCOPY (EGD) WITH ESOPHAGEAL DILATION N/A  09/12/2012   [id number] Schatzki's ring s/p Maloney dilator. Small hiatal hernia. negative path   ESOPHAGOGASTRODUODENOSCOPY (EGD) WITH ESOPHAGEAL DILATION N/A 07/31/2013   Dr. Jena Gauss- normal egd, s/p [facility] dilation empirically. Normal small bowel biopsies    ESOPHAGOGASTRODUODENOSCOPY (EGD) WITH PROPOFOL N/A 09/15/2015   Dr. Jena Gauss: Medium sized hiatal hernia, normal-appearing esophagus status post empiric dilation   ESOPHAGOGASTRODUODENOSCOPY (EGD) WITH PROPOFOL N/A 12/19/2017   erosive esophagitis s/p dilation, erosive gastropathy, normal duodenum   ESOPHAGOGASTRODUODENOSCOPY (EGD) WITH PROPOFOL N/A 09/17/2019   Non-obstructing Schatzki's ring s/p dilation, medium-sized hiatal hernia, otherwise normal.   ESOPHAGOGASTRODUODENOSCOPY (EGD) WITH PROPOFOL N/A 11/05/2021   Procedure: ESOPHAGOGASTRODUODENOSCOPY (EGD) WITH PROPOFOL;  Surgeon: Corbin Ade, MD;  [facility];  Service: Endoscopy;  Laterality: N/A;   FLEXIBLE SIGMOIDOSCOPY  12/30/2010    [name],; internal hemorrhoids, anal papilla   HAND SURGERY     surgical intervention for injury of the fingers of the left hand many years ago   heart stent     HEMORRHOID BANDING     Dr. Jena Gauss   LEFT  HEART CATH AND CORONARY ANGIOGRAPHY N/A 08/29/2019   Procedure: LEFT HEART CATH AND CORONARY ANGIOGRAPHY;  Surgeon: Marykay Lex, MD;  Location: [facility] INVASIVE CV LAB;  Service: Cardiovascular;  Laterality: N/A;   LEFT HEART CATH AND CORONARY ANGIOGRAPHY N/A 03/10/2021   Procedure: LEFT HEART CATH AND CORONARY ANGIOGRAPHY;  Surgeon: Lyn Records, MD;  [facility] INV[facility] CV LAB;  Service: Cardiovascular;  Laterality: N/A;   LEFT HEART CATHETERIZATION WITH CORONARY ANGIOGRAM N/A 01/26/2012   Procedure: LEFT HEART CATHETERIZATION WITH CORONARY ANGIOGRAM;  Surgeon: Rollene Rotunda, MD;  Location: [facility] CATH LAB;  Service: Cardiovascular;  Laterality: N/A;   MALONEY DILATION N/A 09/15/2015   Procedure: Elease Hashimoto DILATION;  Surgeon: Corbin Ade, MD;  [facility];  Service: Endoscopy;  Laterality: N/A;   MALONEY DILATION N/A 12/19/2017   Procedure: Elease Hashimoto DILATION;  Surgeon: Corbin Ade, MD;  [facility];  Service: Endoscopy;  Laterality: N/A;   MALONEY DILATION N/A 09/17/2019   Procedure: Elease Hashimoto DILATION;  Surgeon: Corbin Ade, MD;  [facility];  Service: Endoscopy;  Laterality: N/A;   MALONEY DILATION N/A 11/05/2021   Procedure: Elease Hashimoto DILATION;  Surgeon: Corbin Ade, MD;  [facility];  Service: Endoscopy;  Laterality: N/A;   NASAL SEPTOPLASTY W/ TURBINOPLASTY  10/06/2011   Procedure: NASAL SEPTOPLASTY WITH TURBINATE REDUCTION;  Surgeon: Serena Colonel, MD;  [facility];  Service: ENT;  Laterality: Bilateral;   PERCUTANEOUS CORONARY STENT INTERVENTION (PCI-S) N/A 01/26/2012   Procedure: PERCUTANEOUS CORONARY STENT INTERVENTION (PCI-S);  Surgeon: Rollene Rotunda, MD;  Location: [facility] CATH LAB;  Service: Cardiovascular;  Laterality: N/A;   POLYPECTOMY  09/15/2015   Procedure: POLYPECTOMY;  Surgeon: Corbin Ade, MD;  [facility];  Service: Endoscopy;;  Cecal polyp removed via cold snare/ Descending colon polyp removed via cold snare   POLYPECTOMY  12/19/2017   Procedure: POLYPECTOMY;  Surgeon: Corbin Ade, MD;  [facility];  Service: Endoscopy;;  colon   TEE WITHOUT CARDIOVERSION N/A 03/13/2021   Procedure: TRANSESOPHAGEAL ECHOCARDIOGRAM (TEE);  Surgeon: Corliss Skains, MD;  Location: [facility] OR;  Service: Open Heart Surgery;  Laterality: N/A;    Family History: Family History  Problem Relation Age of Onset   Lung disease Father        deceased, black lung   Heart disease Mother        blood clots   Depression Mother    Cancer Paternal Uncle        unknown type   Cancer Maternal Aunt        unknown type   Kidney failure Maternal Uncle    Hypertension Brother    Colon cancer Neg Hx    ADD / ADHD Neg Hx    Alcohol abuse Neg Hx    Drug abuse  Neg Hx    Anxiety disorder Neg Hx    Bipolar disorder Neg Hx    Dementia Neg Hx    OCD Neg Hx    Paranoid behavior Neg Hx    Schizophrenia Neg Hx    Physical abuse Neg Hx    Sexual abuse Neg Hx    Seizures Neg Hx     Social History: Social History   Tobacco Use  Smoking Status Every Day   Current packs/day: 0.00   Average packs/day: 1 pack/day for 51.6 years (51.6 ttl pk-yrs)   Types: Cigarettes   Start date: 07/31/1969   Last attempt to quit:  03/16/2021   Years since quitting: 2.1   Passive exposure: Current  Smokeless Tobacco Former   Social History   Substance and Sexual Activity  Alcohol Use Yes   Comment: 3 beer today   Social History   Substance and Sexual Activity  Drug Use No    Allergies: No Known Allergies  Medications: Current Outpatient Medications  Medication Sig Dispense Refill   albuterol (VENTOLIN HFA) 108 (90 Base) MCG/ACT inhaler Inhale 2 puffs into the lungs every 4 (four) hours as needed for wheezing or shortness of breath. 18 g 1   ALPRAZolam (XANAX) 1 MG tablet Take 1 tablet (1 mg total) by mouth 3 (three) times daily. 90 tablet 3   amLODipine (NORVASC) 5 MG tablet TAKE 1 TABLET EVERY DAY 90 tablet 3   aspirin EC 81 MG tablet Take 81 mg by mouth daily.     atorvastatin (LIPITOR) 80 MG tablet TAKE 1 TABLET EVERY DAY 90 tablet 3   Cyanocobalamin (VITAMIN B-12 PO) Take 1 tablet by mouth in the morning.     doxycycline (VIBRA-TABS) 100 MG tablet Take 1 tablet (100 mg total) by mouth 2 (two) times daily. 14 tablet 0   FLUoxetine (PROZAC) 40 MG capsule Take 1 capsule (40 mg total) by mouth 2 (two) times daily. 180 capsule 3   gabapentin (NEURONTIN) 300 MG capsule Take two tablets three times a day 540 capsule 3   HYDROcodone-acetaminophen (NORCO/VICODIN) 5-325 MG tablet TAKE ONE TABLET TWICE DAILY AS NEEDED FOR PAIN to last 30 days 45 tablet 0   hydrocortisone (ANUSOL-HC) 25 MG suppository Place 1 suppository (25 mg total) rectally 2 (two) times  daily. 12 suppository 0   isosorbide mononitrate (IMDUR) 30 MG 24 hr tablet Take 30 mg by mouth daily.     lipase/protease/amylase (CREON) 36000 UNITS CPEP capsule Take 2 capsules (72,000 Units total) by mouth 3 (three) times daily with meals. May also take 1 capsule (36,000 Units total) as needed (with snacks). 240 capsule 3   meloxicam (MOBIC) 15 MG tablet TAKE 1 TABLET EVERY DAY 90 tablet 1   metoprolol succinate (TOPROL-XL) 50 MG 24 hr tablet TAKE 1 TABLET EVERY DAY 90 tablet 3   mupirocin ointment (BACTROBAN) 2 % Apply thin amount bid to knee wound for 10 days 15 g 2   nitroGLYCERIN (NITROSTAT) 0.4 MG SL tablet Place 1 tablet (0.4 mg total) under the tongue every 5 (five) minutes as needed for chest pain. 20 tablet 5   OLANZapine (ZYPREXA) 10 MG tablet Take 1 tablet (10 mg total) by mouth at bedtime. 90 tablet 3   pantoprazole (PROTONIX) 40 MG tablet TAKE 1 TABLET TWICE DAILY BEFORE MEALS 180 tablet 3   polyethylene glycol (MIRALAX / GLYCOLAX) 17 g packet Take 17 g by mouth 2 (two) times daily. 180 packet 0   potassium chloride (KLOR-CON M) 10 MEQ tablet Take 1 tablet (10 mEq total) by mouth daily for 5 doses. 5 tablet 0   psyllium (METAMUCIL) 58.6 % packet Take 1 packet by mouth 2 (two) times daily. 60 packet 2   rOPINIRole (REQUIP) 2 MG tablet TAKE 1 TABLET AT BEDTIME 90 tablet 3   tamsulosin (FLOMAX) 0.4 MG CAPS capsule TAKE 1 CAPSULE EVERY DAY 90 capsule 3   traZODone (DESYREL) 50 MG tablet TAKE 1 TABLET AT BEDTIME 90 tablet 3   TRELEGY ELLIPTA 100-62.5-25 MCG/ACT AEPB INHALE 1 PUFF INTO THE LUNGS DAILY. 180 each 3   valsartan (DIOVAN) 80 MG tablet TAKE 1 TABLET EVERY  DAY 90 tablet 3   meclizine (ANTIVERT) 25 MG tablet Take 1 tablet (25 mg total) by mouth 3 (three) times daily as needed for dizziness. (Patient not taking: Reported on 05/17/2023) 30 tablet 0   No current facility-administered medications for this visit.    Review of Systems: GENERAL: negative for malaise, night  sweats HEENT: No changes in hearing or vision, no nose bleeds or other nasal problems. NECK: Negative for lumps, goiter, pain and significant neck swelling RESPIRATORY: Negative for cough, wheezing CARDIOVASCULAR: Negative for chest pain, leg swelling, palpitations, orthopnea GI: SEE HPI MUSCULOSKELETAL: Negative for joint pain or swelling, back pain, and muscle pain. SKIN: Negative for lesions, rash HEMATOLOGY Negative for prolonged bleeding, bruising easily, and swollen nodes. ENDOCRINE: Negative for cold or heat intolerance, polyuria, polydipsia and goiter. NEURO: negative for tremor, gait imbalance, syncope and seizures. The remainder of the review of systems is noncontributory.   Physical Exam: BP 132/89 (BP Location: Left Arm, Patient Position: Sitting, Cuff Size: Normal)   Pulse 70   Temp 97.6 F (36.4 C) (Oral)   Ht 5\' 10"  (1.778 m)   Wt 167 lb 9.6 oz (76 kg)   BMI 24.05 kg/m  GENERAL: The patient is AO x3, in no acute distress. HEENT: Head is normocephalic and atraumatic. EOMI are intact. Mouth is well hydrated and without lesions. NECK: Supple. No masses LUNGS: Clear to auscultation. No presence of rhonchi/wheezing/rales. Adequate chest expansion HEART: RRR, normal s1 and s2. ABDOMEN: Soft, nontender, no guarding, no peritoneal signs, and nondistended. BS +. No masses.   Imaging/Labs: as above     Latest Ref Rng & Units 04/21/2023    7:26 PM 03/23/2023    4:10 PM 03/18/2023    3:00 PM  CBC  WBC 4.0 - 10.5 K/uL 6.9  6.8  12.4   Hemoglobin 13.0 - 17.0 g/dL 52.8  41.3  24.4   Hematocrit 39.0 - 52.0 % 45.9  42.1  50.1   Platelets 150 - 400 K/uL 179  253  306    No results found for: "IRON", "TIBC", "FERRITIN"  I personally reviewed and interpreted the available labs, imaging and endoscopic files.  Recent FOBT also negative  Hemoglobin 15 remains stable Impression and Plan:  ZAAVAN SIPP is a 67 y.o. male with multiple comorbidities including CAD status post  three-vessel CABG due to STEMI, history of colon cancer in situ who presents for evaluation of Painless hematochezia .  #Painless hematochezia   This is patient's second for the same complaint which is painless hematochezia and patient is very concerned.  Patient had bidirectional endoscopy on 10/2021 with upper endoscopy and colonoscopy with TI intubation.  We suspected this to be be benign in origins such as hemorrhoidal bleed and expected it to be self resolving, which havent in past 2 months   Although painless hematochezia persist, patient reports frequency is also increasing and this is his second clinic visit for the same complaint  Given persistent symptoms we discussed [name] colonoscopy for which patient is agreeable.  Will schedule for ileo- colonoscopy as discussed with patient and also his own preference  I do not expect this to be malignancy but patient may have developed angiectasia or inflammatory bowel disease since last evaluation  ER precautions given to patient if any significant bleeding, fatigue chest pain or blurry vision go to the ER  All questions were answered.      [name], MD Gastroenterology and Hepatology [facility] Gastroenterology   This  chart has been completed using Engineer, civil (consulting) software, and while attempts have been made to ensure accuracy , certain words and phrases may not be transcribed as intended

## 2023-05-17 NOTE — Addendum Note (Signed)
Addended by: Marlowe Shores on: 05/17/2023 03:45 PM   Modules accepted: Orders

## 2023-05-17 NOTE — H&P (View-Only) (Signed)
 [name] , M.D. Gastroenterology & Hepatology [facility] Gastroenterology [address] [place], [place] [address] Primary Care Physician: Babs Sciara, MD [address] [place] [address]  Chief Complaint:  hematochezia   History of Present Illness:  [name] is a 67 y.o. male with multiple comorbidities including CAD status post three-vessel CABG due to STEMI, history of colon cancer in situ who presents for evaluation of Painless hematochezia .  This is patient's second for the same complaint which is painless hematochezia and patient is very concerned.  Patient was last seen 2 months ago where he was noticing fresh blood.  He continues to see blood in the stool and reports it is pouring out rather happens 4 to 5 days a week mixed with stool. No nocturnal symptoms.  Patient denies any dizziness chest pain or shortness of breath.  Patient reports that he has 1 soft bowel movement daily but sometimes he has to strain.  Denies any abdominal pain  Last [id number]  - Normal esophagus. Dilated. - Small hiatal hernia. - Normal duodenal bulb and second portion of the duodenum. Except for a duodenal polyp/ removed as described above  Last Colonoscopy:10/2021 One 3 mm polyp in the distal rectum, removed with a cold biopsy forceps. Resected and retrieved. - Non- bleeding internal hemorrhoids. Normal- appearing terminal ileum. - The examination was otherwise normal on direct and retroflexion views. Status post segmental biopsy  A. DUODENUM POLYP, BIOPSY:  Reactive duodenal mucosa with lymphoid aggregate  Negative for dysplasia and gastric metaplasia   B. ASCENDING COLON, BIOPSY:  Benign colonic mucosa with no diagnostic abnormality   C. DESCENDING COLON, BIOPSY:  Benign colonic mucosa with no diagnostic abnormality   D. RECTUM, POLYPECTOMY:  Hyperplastic polyp  Negative for dysplasia and carcinoma   Past Medical History: Past  Medical History:  Diagnosis Date   Anxiety    Anxiety and depression    ASCVD (arteriosclerotic cardiovascular disease)    a. s/p PTCA to LCx in 1993 b. BMS to RCA in 01/2012 with residual 80% OM2 stenosis and medical management recommended c. 08/2019: cath showing patent stents with occluded OM2 --> medical management. d. s/p CABG in 02/2021 with LIMA-LAD, reverse SVG-D1 and SVG-OM   Asthma    CAD (coronary artery disease)    Carcinoma in situ of colon 2004   rectal polyp   Colitis, ischemic (HCC) 2011   COPD (chronic obstructive pulmonary disease) (HCC)    mild ;excercise induced hypoxemia by cp stress test ;asthma ,bronchitis,   Depression    Diverticulosis    Gastritis 12/30/2010   EGD Dr Jena Gauss   GERD (gastroesophageal reflux disease)    GI bleed    H. 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)    Leukocytosis    Dr Shon Baton   Lung nodule 07/16/2011   Myocardial infarction [facility])    age 52   Restless leg syndrome    Schatzki's ring    Sleep apnea    does not use CPAP:cannot tolerate, PCP aware   Syncope    Tobacco abuse    50 pack years continuing at one halp pack daily   Tubular adenoma of colon 06/2009   Colonosocpy Dr Jena Gauss    Past Surgical History: Past Surgical History:  Procedure Laterality Date   BIOPSY  12/19/2017   Procedure: BIOPSY;  Surgeon: Corbin Ade, MD;  [facility];  Service: Endoscopy;;  gastric   BIOPSY  11/05/2021   Procedure: BIOPSY;  Surgeon: Corbin Ade, MD;  [facility];  Service: Endoscopy;;   CARDIAC CATHETERIZATION     CHOLECYSTECTOMY  2004   COLONOSCOPY  06/2009   normal terminal ileum, segmental mild inflammation of sigmoid colon (bx unremarkable), polyp, tubular adenoma   COLONOSCOPY N/A 07/31/2013   Dr.Rourk- redundant anal canal hemorrhoids, colonic diverticulosis, tubular adenoma    COLONOSCOPY W/ POLYPECTOMY  2004   rectal polyp with carcinoma in situ removed via colonoscopy   COLONOSCOPY WITH PROPOFOL N/A 09/15/2015   Dr. Jena Gauss: Scattered diverticula throughout the colon, 2 sessile polyps found in the descending colon and cecum, 5 mm in size.  Cecal polyp was sessile serrated polyp, descending colon polyp was a tubular adenoma.  He had a abnormal perianal exam along with grade 3 hemorrhoids.  Surveillance exam recommended for 5-year follow-up.   COLONOSCOPY WITH PROPOFOL N/A 12/19/2017   one 5 mm polyp in ascending colon and 1 cm sessile polyp in ascending s/p removal. Tubular adenoma. internal hemorrhoids   COLONOSCOPY WITH PROPOFOL N/A 11/05/2021   Procedure: COLONOSCOPY WITH PROPOFOL;  Surgeon: Corbin Ade, MD;  [facility];  Service: Endoscopy;  Laterality: N/A;  12:30pm   CORONARY ANGIOPLASTY WITH STENT PLACEMENT  01/26/2012   "1; total is now 2"   CORONARY ARTERY BYPASS GRAFT N/A 03/13/2021   Procedure: CORONARY ARTERY BYPASS GRAFTING (CABG), ON PUMP, TIMES THREE, USING LEFT INTERNAL MAMMARY ARTERY AND RIGHT ENDOSCOPICALLY HARVESTED GREATER SAPHENOUS VEIN;  Surgeon: Corliss Skains, MD;  [facility]: [facility] OR;  Service: Open Heart Surgery;  Laterality: N/A;   CORONARY/GRAFT ACUTE MI REVASCULARIZATION N/A 03/10/2021   Procedure: Coronary/Graft Acute MI Revascularization;  Surgeon: Lyn Records, MD;  Location: [facility] INVASIVE CV LAB;  Service: Cardiovascular;  Laterality: N/A;   ENDOVEIN HARVEST OF GREATER SAPHENOUS VEIN Right 03/13/2021   Procedure: ENDOVEIN HARVEST OF GREATER SAPHENOUS VEIN;  Surgeon: Corliss Skains, MD;  [facility]: [facility] OR;  Service: Open Heart Surgery;  Laterality: Right;   ESOPHAGOGASTRODUODENOSCOPY  02/2009   query Barrett's but bx negative   ESOPHAGOGASTRODUODENOSCOPY  12/30/2010   [name],gastritis, dilated 106F, sm HH, 1 small ulcer, Duodenal erosions, benign bx   ESOPHAGOGASTRODUODENOSCOPY (EGD) WITH ESOPHAGEAL DILATION N/A  09/12/2012   [id number] Schatzki's ring s/p Maloney dilator. Small hiatal hernia. negative path   ESOPHAGOGASTRODUODENOSCOPY (EGD) WITH ESOPHAGEAL DILATION N/A 07/31/2013   Dr. Jena Gauss- normal egd, s/p [facility] dilation empirically. Normal small bowel biopsies    ESOPHAGOGASTRODUODENOSCOPY (EGD) WITH PROPOFOL N/A 09/15/2015   Dr. Jena Gauss: Medium sized hiatal hernia, normal-appearing esophagus status post empiric dilation   ESOPHAGOGASTRODUODENOSCOPY (EGD) WITH PROPOFOL N/A 12/19/2017   erosive esophagitis s/p dilation, erosive gastropathy, normal duodenum   ESOPHAGOGASTRODUODENOSCOPY (EGD) WITH PROPOFOL N/A 09/17/2019   Non-obstructing Schatzki's ring s/p dilation, medium-sized hiatal hernia, otherwise normal.   ESOPHAGOGASTRODUODENOSCOPY (EGD) WITH PROPOFOL N/A 11/05/2021   Procedure: ESOPHAGOGASTRODUODENOSCOPY (EGD) WITH PROPOFOL;  Surgeon: Corbin Ade, MD;  [facility];  Service: Endoscopy;  Laterality: N/A;   FLEXIBLE SIGMOIDOSCOPY  12/30/2010    [name],; internal hemorrhoids, anal papilla   HAND SURGERY     surgical intervention for injury of the fingers of the left hand many years ago   heart stent     HEMORRHOID BANDING     Dr. Jena Gauss   LEFT  HEART CATH AND CORONARY ANGIOGRAPHY N/A 08/29/2019   Procedure: LEFT HEART CATH AND CORONARY ANGIOGRAPHY;  Surgeon: Marykay Lex, MD;  Location: [facility] INVASIVE CV LAB;  Service: Cardiovascular;  Laterality: N/A;   LEFT HEART CATH AND CORONARY ANGIOGRAPHY N/A 03/10/2021   Procedure: LEFT HEART CATH AND CORONARY ANGIOGRAPHY;  Surgeon: Lyn Records, MD;  [facility] CV [facility];  Service: Cardiovascular;  Laterality: N/A;   LEFT HEART CATHETERIZATION WITH CORONARY ANGIOGRAM N/A 01/26/2012   Procedure: LEFT HEART CATHETERIZATION WITH CORONARY ANGIOGRAM;  Surgeon: Rollene Rotunda, MD;  Location: [facility] CATH LAB;  Service: Cardiovascular;  Laterality: N/A;   MALONEY DILATION N/A 09/15/2015   Procedure: Elease Hashimoto DILATION;  Surgeon: Corbin Ade, MD;  [facility];  Service: Endoscopy;  Laterality: N/A;   MALONEY DILATION N/A 12/19/2017   Procedure: Elease Hashimoto DILATION;  Surgeon: Corbin Ade, MD;  [facility];  Service: Endoscopy;  Laterality: N/A;   MALONEY DILATION N/A 09/17/2019   Procedure: Elease Hashimoto DILATION;  Surgeon: Corbin Ade, MD;  [facility];  Service: Endoscopy;  Laterality: N/A;   MALONEY DILATION N/A 11/05/2021   Procedure: Elease Hashimoto DILATION;  Surgeon: Corbin Ade, MD;  [facility];  Service: Endoscopy;  Laterality: N/A;   NASAL SEPTOPLASTY W/ TURBINOPLASTY  10/06/2011   Procedure: NASAL SEPTOPLASTY WITH TURBINATE REDUCTION;  Surgeon: Serena Colonel, MD;  [facility];  Service: ENT;  Laterality: Bilateral;   PERCUTANEOUS CORONARY STENT INTERVENTION (PCI-S) N/A 01/26/2012   Procedure: PERCUTANEOUS CORONARY STENT INTERVENTION (PCI-S);  Surgeon: Rollene Rotunda, MD;  Location: [facility] CATH LAB;  Service: Cardiovascular;  Laterality: N/A;   POLYPECTOMY  09/15/2015   Procedure: POLYPECTOMY;  Surgeon: Corbin Ade, MD;  [facility];  Service: Endoscopy;;  Cecal polyp removed via cold snare/ Descending colon polyp removed via cold snare   POLYPECTOMY  12/19/2017   Procedure: POLYPECTOMY;  Surgeon: Corbin Ade, MD;  [facility];  Service: Endoscopy;;  colon   TEE WITHOUT CARDIOVERSION N/A 03/13/2021   Procedure: TRANSESOPHAGEAL ECHOCARDIOGRAM (TEE);  Surgeon: Corliss Skains, MD;  Location: [facility] OR;  Service: Open Heart Surgery;  Laterality: N/A;    Family History: Family History  Problem Relation Age of Onset   Lung disease Father        deceased, black lung   Heart disease Mother        blood clots   Depression Mother    Cancer Paternal Uncle        unknown type   Cancer Maternal Aunt        unknown type   Kidney failure Maternal Uncle    Hypertension Brother    Colon cancer Neg Hx    ADD / ADHD Neg Hx    Alcohol abuse Neg Hx    Drug abuse  Neg Hx    Anxiety disorder Neg Hx    Bipolar disorder Neg Hx    Dementia Neg Hx    OCD Neg Hx    Paranoid behavior Neg Hx    Schizophrenia Neg Hx    Physical abuse Neg Hx    Sexual abuse Neg Hx    Seizures Neg Hx     Social History: Social History   Tobacco Use  Smoking Status Every Day   Current packs/day: 0.00   Average packs/day: 1 pack/day for 51.6 years (51.6 ttl pk-yrs)   Types: Cigarettes   Start date: 07/31/1969   Last attempt to quit:  03/16/2021   Years since quitting: 2.1   Passive exposure: Current  Smokeless Tobacco Former   Social History   Substance and Sexual Activity  Alcohol Use Yes   Comment: 3 beer today   Social History   Substance and Sexual Activity  Drug Use No    Allergies: No Known Allergies  Medications: Current Outpatient Medications  Medication Sig Dispense Refill   albuterol (VENTOLIN HFA) 108 (90 Base) MCG/ACT inhaler Inhale 2 puffs into the lungs every 4 (four) hours as needed for wheezing or shortness of breath. 18 g 1   ALPRAZolam (XANAX) 1 MG tablet Take 1 tablet (1 mg total) by mouth 3 (three) times daily. 90 tablet 3   amLODipine (NORVASC) 5 MG tablet TAKE 1 TABLET EVERY DAY 90 tablet 3   aspirin EC 81 MG tablet Take 81 mg by mouth daily.     atorvastatin (LIPITOR) 80 MG tablet TAKE 1 TABLET EVERY DAY 90 tablet 3   Cyanocobalamin (VITAMIN B-12 PO) Take 1 tablet by mouth in the morning.     doxycycline (VIBRA-TABS) 100 MG tablet Take 1 tablet (100 mg total) by mouth 2 (two) times daily. 14 tablet 0   FLUoxetine (PROZAC) 40 MG capsule Take 1 capsule (40 mg total) by mouth 2 (two) times daily. 180 capsule 3   gabapentin (NEURONTIN) 300 MG capsule Take two tablets three times a day 540 capsule 3   HYDROcodone-acetaminophen (NORCO/VICODIN) 5-325 MG tablet TAKE ONE TABLET TWICE DAILY AS NEEDED FOR PAIN to last 30 days 45 tablet 0   hydrocortisone (ANUSOL-HC) 25 MG suppository Place 1 suppository (25 mg total) rectally 2 (two) times  daily. 12 suppository 0   isosorbide mononitrate (IMDUR) 30 MG 24 hr tablet Take 30 mg by mouth daily.     lipase/protease/amylase (CREON) 36000 UNITS CPEP capsule Take 2 capsules (72,000 Units total) by mouth 3 (three) times daily with meals. May also take 1 capsule (36,000 Units total) as needed (with snacks). 240 capsule 3   meloxicam (MOBIC) 15 MG tablet TAKE 1 TABLET EVERY DAY 90 tablet 1   metoprolol succinate (TOPROL-XL) 50 MG 24 hr tablet TAKE 1 TABLET EVERY DAY 90 tablet 3   mupirocin ointment (BACTROBAN) 2 % Apply thin amount bid to knee wound for 10 days 15 g 2   nitroGLYCERIN (NITROSTAT) 0.4 MG SL tablet Place 1 tablet (0.4 mg total) under the tongue every 5 (five) minutes as needed for chest pain. 20 tablet 5   OLANZapine (ZYPREXA) 10 MG tablet Take 1 tablet (10 mg total) by mouth at bedtime. 90 tablet 3   pantoprazole (PROTONIX) 40 MG tablet TAKE 1 TABLET TWICE DAILY BEFORE MEALS 180 tablet 3   polyethylene glycol (MIRALAX / GLYCOLAX) 17 g packet Take 17 g by mouth 2 (two) times daily. 180 packet 0   potassium chloride (KLOR-CON M) 10 MEQ tablet Take 1 tablet (10 mEq total) by mouth daily for 5 doses. 5 tablet 0   psyllium (METAMUCIL) 58.6 % packet Take 1 packet by mouth 2 (two) times daily. 60 packet 2   rOPINIRole (REQUIP) 2 MG tablet TAKE 1 TABLET AT BEDTIME 90 tablet 3   tamsulosin (FLOMAX) 0.4 MG CAPS capsule TAKE 1 CAPSULE EVERY DAY 90 capsule 3   traZODone (DESYREL) 50 MG tablet TAKE 1 TABLET AT BEDTIME 90 tablet 3   TRELEGY ELLIPTA 100-62.5-25 MCG/ACT AEPB INHALE 1 PUFF INTO THE LUNGS DAILY. 180 each 3   valsartan (DIOVAN) 80 MG tablet TAKE 1 TABLET EVERY  DAY 90 tablet 3   meclizine (ANTIVERT) 25 MG tablet Take 1 tablet (25 mg total) by mouth 3 (three) times daily as needed for dizziness. (Patient not taking: Reported on 05/17/2023) 30 tablet 0   No current facility-administered medications for this visit.    Review of Systems: GENERAL: negative for malaise, night  sweats HEENT: No changes in hearing or vision, no nose bleeds or other nasal problems. NECK: Negative for lumps, goiter, pain and significant neck swelling RESPIRATORY: Negative for cough, wheezing CARDIOVASCULAR: Negative for chest pain, leg swelling, palpitations, orthopnea GI: SEE HPI MUSCULOSKELETAL: Negative for joint pain or swelling, back pain, and muscle pain. SKIN: Negative for lesions, rash HEMATOLOGY Negative for prolonged bleeding, bruising easily, and swollen nodes. ENDOCRINE: Negative for cold or heat intolerance, polyuria, polydipsia and goiter. NEURO: negative for tremor, gait imbalance, syncope and seizures. The remainder of the review of systems is noncontributory.   Physical Exam: BP 132/89 (BP Location: Left Arm, Patient Position: Sitting, Cuff Size: Normal)   Pulse 70   Temp 97.6 F (36.4 C) (Oral)   Ht 5\' 10"  (1.778 m)   Wt 167 lb 9.6 oz (76 kg)   BMI 24.05 kg/m  GENERAL: The patient is AO x3, in no acute distress. HEENT: Head is normocephalic and atraumatic. EOMI are intact. Mouth is well hydrated and without lesions. NECK: Supple. No masses LUNGS: Clear to auscultation. No presence of rhonchi/wheezing/rales. Adequate chest expansion HEART: RRR, normal s1 and s2. ABDOMEN: Soft, nontender, no guarding, no peritoneal signs, and nondistended. BS +. No masses.   Imaging/Labs: as above     Latest Ref Rng & Units 04/21/2023    7:26 PM 03/23/2023    4:10 PM 03/18/2023    3:00 PM  CBC  WBC 4.0 - 10.5 K/uL 6.9  6.8  12.4   Hemoglobin 13.0 - 17.0 g/dL 52.8  41.3  24.4   Hematocrit 39.0 - 52.0 % 45.9  42.1  50.1   Platelets 150 - 400 K/uL 179  253  306    No results found for: "IRON", "TIBC", "FERRITIN"  I personally reviewed and interpreted the available labs, imaging and endoscopic files.  Recent FOBT also negative  Hemoglobin 15 remains stable Impression and Plan:  [name] is a 67 y.o. male with multiple comorbidities including CAD status post  three-vessel CABG due to STEMI, history of colon cancer in situ who presents for evaluation of Painless hematochezia .  #Painless hematochezia   This is patient's second for the same complaint which is painless hematochezia and patient is very concerned.  Patient had bidirectional endoscopy on 10/2021 with upper endoscopy and colonoscopy with TI intubation.  We suspected this to be be benign in origins such as hemorrhoidal bleed and expected it to be self resolving, which havent in past 2 months   Although painless hematochezia persist, patient reports frequency is also increasing and this is his second clinic visit for the same complaint  Given persistent symptoms we discussed [name] colonoscopy for which patient is agreeable.  Will schedule for ileo- colonoscopy as discussed with patient and also his own preference  I do not expect this to be malignancy but patient may have developed angiectasia or inflammatory bowel disease since last evaluation  ER precautions given to patient if any significant bleeding, fatigue chest pain or blurry vision go to the ER  All questions were answered.      [name], MD Gastroenterology and Hepatology [facility] Gastroenterology   This  chart has been completed using Engineer, civil (consulting) software, and while attempts have been made to ensure accuracy , certain words and phrases may not be transcribed as intended

## 2023-05-20 ENCOUNTER — Encounter: Payer: Medicare HMO | Admitting: Physician Assistant

## 2023-05-24 ENCOUNTER — Ambulatory Visit (HOSPITAL_COMMUNITY)
Admission: RE | Admit: 2023-05-24 | Discharge: 2023-05-24 | Disposition: A | Discharge status: Home / Self Care | Payer: Medicare HMO | Source: Ambulatory Visit | Attending: Acute Care | Admitting: Acute Care

## 2023-05-24 DIAGNOSIS — F17210 Nicotine dependence, cigarettes, uncomplicated: Secondary | ICD-10-CM | POA: Insufficient documentation

## 2023-05-24 DIAGNOSIS — Z87891 Personal history of nicotine dependence: Secondary | ICD-10-CM | POA: Insufficient documentation

## 2023-05-24 DIAGNOSIS — Z122 Encounter for screening for malignant neoplasm of respiratory organs: Secondary | ICD-10-CM | POA: Diagnosis not present

## 2023-05-27 ENCOUNTER — Other Ambulatory Visit: Payer: Self-pay

## 2023-05-27 ENCOUNTER — Other Ambulatory Visit: Payer: Self-pay | Admitting: Family Medicine

## 2023-05-27 ENCOUNTER — Encounter: Payer: Medicare HMO | Admitting: Acute Care

## 2023-05-27 NOTE — Progress Notes (Signed)
 Attempted to reach pt x3; no answer and no machine to leave mesage

## 2023-05-27 NOTE — Telephone Encounter (Signed)
Copied from CRM [id number]. Topic: Clinical - Medication Refill >> May 27, 2023 11:46 AM Fonda Kinder J wrote: Most Recent Primary Care Visit:  Provider: [name] A  Department: [facility]  Visit Type: OFFICE VISIT  Date: 03/18/2023  Medication: HYDROcodone-acetaminophen (NORCO/VICODIN) 5-325 MG  Has the patient contacted their pharmacy? Yes (Agent: If no, request that the patient contact the pharmacy for the refill. If patient does not wish to contact the pharmacy document the reason why and proceed with request.) (Agent: If yes, when and what did the pharmacy advise?) Contact PCP, pt said a referral was sent over from pharmacy  Is this the correct pharmacy for this prescription? Yes If no, delete pharmacy and type the correct one.  This is the patient's preferred pharmacy:  [facility] - [place], [place] - [address] [place] [address] Phone: [phone] Fax: [phone]    Has the prescription been filled recently? No  Is the patient out of the medication? Yes  Has the patient been seen for an appointment in the last year OR does the patient have an upcoming appointment? Yes  Can we respond through MyChart? No  Agent: Please be advised that Rx refills may take up to 3 business days. We ask that you follow-up with your pharmacy.

## 2023-05-30 ENCOUNTER — Encounter: Payer: Self-pay | Admitting: Adult Health

## 2023-05-30 ENCOUNTER — Ambulatory Visit (INDEPENDENT_AMBULATORY_CARE_PROVIDER_SITE_OTHER): Payer: Medicare HMO | Admitting: Adult Health

## 2023-05-30 DIAGNOSIS — F17210 Nicotine dependence, cigarettes, uncomplicated: Secondary | ICD-10-CM

## 2023-05-30 NOTE — Patient Instructions (Addendum)

## 2023-05-30 NOTE — Progress Notes (Signed)
  Virtual Visit via Telephone Note  I connected with [name] , 05/30/23 9:19 AM by a telemedicine application and verified that I am speaking with the correct person using two identifiers.  Location: Patient: home Provider: home   I discussed the limitations of evaluation and management by telemedicine and the availability of in person appointments. The patient expressed understanding and agreed to proceed.   Shared Decision Making Visit Lung Cancer Screening Program [phone])   Eligibility: 67 y.o. Pack Years Smoking History Calculation 53 pack years (# packs/per year x # years smoked) Recent History of coughing up blood  no Unexplained weight loss? no ( >Than 15 pounds within the last 6 months ) Prior History Lung / other cancer no (Diagnosis within the last 5 years already requiring surveillance chest CT Scans). Smoking Status Current Smoker  Visit Components: Discussion included one or more decision making aids. YES Discussion included risk/benefits of screening. YES Discussion included potential follow up diagnostic testing for abnormal scans. YES Discussion included meaning and risk of over diagnosis. YES Discussion included meaning and risk of False Positives. YES Discussion included meaning of total radiation exposure. YES  Counseling Included: Importance of adherence to annual lung cancer LDCT screening. YES Impact of comorbidities on ability to participate in the program. YES Ability and willingness to under diagnostic treatment. YES  Smoking Cessation Counseling: Current Smokers:  Discussed importance of smoking cessation. yes Information about tobacco cessation classes and interventions provided to patient. yes Patient provided with "ticket" for LDCT Scan. yes Symptomatic Patient. no Diagnosis Code: Tobacco Use Z72.0 Asymptomatic Patient yes  Counseling (Intermediate counseling: > three minutes counseling) U0454  Z12.2-Screening of respiratory  organs Z87.891-Personal history of nicotine dependence   Danford Bad 05/30/23

## 2023-06-03 ENCOUNTER — Telehealth (INDEPENDENT_AMBULATORY_CARE_PROVIDER_SITE_OTHER): Payer: Self-pay | Admitting: Gastroenterology

## 2023-06-03 NOTE — Telephone Encounter (Signed)
Pt wife left voicemail in regards to what type of laxative or enema to take for TCS on 06/06/23. Returned call and spoke with pt and advised him to take 2 5mg  Dulcolax tablets at 2pm on 06/05/23. I did ask patient if he received instructions and pt states he did.

## 2023-06-06 ENCOUNTER — Other Ambulatory Visit: Payer: Self-pay

## 2023-06-06 ENCOUNTER — Ambulatory Visit (HOSPITAL_COMMUNITY): Payer: Medicare HMO | Admitting: Anesthesiology

## 2023-06-06 ENCOUNTER — Ambulatory Visit (HOSPITAL_COMMUNITY)
Admission: RE | Admit: 2023-06-06 | Discharge: 2023-06-06 | Disposition: A | Discharge status: Home / Self Care | Payer: Medicare HMO | Attending: Gastroenterology | Admitting: Gastroenterology

## 2023-06-06 ENCOUNTER — Encounter (HOSPITAL_COMMUNITY)
Admission: RE | Disposition: A | Discharge status: Home / Self Care | Payer: Self-pay | Source: Home / Self Care | Attending: Gastroenterology

## 2023-06-06 ENCOUNTER — Ambulatory Visit (HOSPITAL_BASED_OUTPATIENT_CLINIC_OR_DEPARTMENT_OTHER): Payer: Medicare HMO | Admitting: Anesthesiology

## 2023-06-06 ENCOUNTER — Encounter (INDEPENDENT_AMBULATORY_CARE_PROVIDER_SITE_OTHER): Payer: Self-pay | Admitting: *Deleted

## 2023-06-06 DIAGNOSIS — F32A Depression, unspecified: Secondary | ICD-10-CM | POA: Insufficient documentation

## 2023-06-06 DIAGNOSIS — K5730 Diverticulosis of large intestine without perforation or abscess without bleeding: Secondary | ICD-10-CM | POA: Insufficient documentation

## 2023-06-06 DIAGNOSIS — F419 Anxiety disorder, unspecified: Secondary | ICD-10-CM | POA: Insufficient documentation

## 2023-06-06 DIAGNOSIS — Z85038 Personal history of other malignant neoplasm of large intestine: Secondary | ICD-10-CM | POA: Insufficient documentation

## 2023-06-06 DIAGNOSIS — K449 Diaphragmatic hernia without obstruction or gangrene: Secondary | ICD-10-CM | POA: Insufficient documentation

## 2023-06-06 DIAGNOSIS — G4730 Sleep apnea, unspecified: Secondary | ICD-10-CM | POA: Diagnosis not present

## 2023-06-06 DIAGNOSIS — Z955 Presence of coronary angioplasty implant and graft: Secondary | ICD-10-CM | POA: Insufficient documentation

## 2023-06-06 DIAGNOSIS — K644 Residual hemorrhoidal skin tags: Secondary | ICD-10-CM | POA: Insufficient documentation

## 2023-06-06 DIAGNOSIS — K921 Melena: Secondary | ICD-10-CM | POA: Insufficient documentation

## 2023-06-06 DIAGNOSIS — Z87891 Personal history of nicotine dependence: Secondary | ICD-10-CM

## 2023-06-06 DIAGNOSIS — K648 Other hemorrhoids: Secondary | ICD-10-CM | POA: Insufficient documentation

## 2023-06-06 DIAGNOSIS — Z951 Presence of aortocoronary bypass graft: Secondary | ICD-10-CM | POA: Insufficient documentation

## 2023-06-06 DIAGNOSIS — I10 Essential (primary) hypertension: Secondary | ICD-10-CM | POA: Insufficient documentation

## 2023-06-06 DIAGNOSIS — K219 Gastro-esophageal reflux disease without esophagitis: Secondary | ICD-10-CM | POA: Insufficient documentation

## 2023-06-06 DIAGNOSIS — I25119 Atherosclerotic heart disease of native coronary artery with unspecified angina pectoris: Secondary | ICD-10-CM | POA: Diagnosis not present

## 2023-06-06 DIAGNOSIS — F17210 Nicotine dependence, cigarettes, uncomplicated: Secondary | ICD-10-CM | POA: Insufficient documentation

## 2023-06-06 DIAGNOSIS — I252 Old myocardial infarction: Secondary | ICD-10-CM | POA: Diagnosis not present

## 2023-06-06 DIAGNOSIS — Z122 Encounter for screening for malignant neoplasm of respiratory organs: Secondary | ICD-10-CM

## 2023-06-06 DIAGNOSIS — J4489 Other specified chronic obstructive pulmonary disease: Secondary | ICD-10-CM | POA: Diagnosis not present

## 2023-06-06 LAB — HM COLONOSCOPY

## 2023-06-06 SURGERY — COLONOSCOPY WITH PROPOFOL
Anesthesia: General

## 2023-06-06 MED ORDER — LIDOCAINE HCL (PF) 2 % IJ SOLN
INTRAMUSCULAR | Status: DC | PRN
  Administered 2023-06-06: 50 mg via INTRADERMAL

## 2023-06-06 MED ORDER — PROPOFOL 500 MG/50ML IV EMUL
INTRAVENOUS | Status: DC | PRN
  Administered 2023-06-06: 150 ug/kg/min via INTRAVENOUS

## 2023-06-06 MED ORDER — PROPOFOL 10 MG/ML IV BOLUS
INTRAVENOUS | Status: DC | PRN
  Administered 2023-06-06: 100 mg via INTRAVENOUS

## 2023-06-06 MED ORDER — LACTATED RINGERS IV SOLN: INTRAVENOUS | Status: DC

## 2023-06-06 NOTE — Anesthesia Procedure Notes (Signed)
Date/Time: 06/06/2023 10:34 AM  Performed by: Julian Reil, CRNAPre-anesthesia Checklist: Patient identified, Emergency Drugs available, Suction available and Patient being monitored Patient Re-evaluated:Patient Re-evaluated prior to induction Oxygen Delivery Method: Simple face mask Induction Type: IV induction Placement Confirmation: positive ETCO2

## 2023-06-06 NOTE — Discharge Instructions (Signed)
  Discharge instructions Please read the instructions outlined below and refer to this sheet in the next few weeks. These discharge instructions provide you with general information on caring for yourself after you leave the hospital. Your doctor may also give you specific instructions. While your treatment has been planned according to the most current medical practices available, unavoidable complications occasionally occur. If you have any problems or questions after discharge, please call your doctor. ACTIVITY You may resume your regular activity but move at a slower pace for the next 24 hours.  Take frequent rest periods for the next 24 hours.  Walking will help expel (get rid of) the air and reduce the bloated feeling in your abdomen.  No driving for 24 hours (because of the anesthesia (medicine) used during the test).  You may shower.  Do not sign any important legal documents or operate any machinery for 24 hours (because of the anesthesia used during the test).  NUTRITION Drink plenty of fluids.  You may resume your normal diet.  Begin with a light meal and progress to your normal diet.  Avoid alcoholic beverages for 24 hours or as instructed by your caregiver.  MEDICATIONS You may resume your normal medications unless your caregiver tells you otherwise.  WHAT YOU CAN EXPECT TODAY You may experience abdominal discomfort such as a feeling of fullness or "gas" pains.  FOLLOW-UP Your doctor will discuss the results of your test with you.  SEEK IMMEDIATE MEDICAL ATTENTION IF ANY OF THE FOLLOWING OCCUR: Excessive nausea (feeling sick to your stomach) and/or vomiting.  Severe abdominal pain and distention (swelling).  Trouble swallowing.  Temperature over 101 F (37.8 C).  Rectal bleeding or vomiting of blood.    Ensure adequate fluid intake: Aim for 8 glasses of water daily. Follow a high fiber diet: Include foods such as dates, prunes, pears, and kiwi. Use Metamucil twice a  day.    I hope you have a great rest of your week!   [name] , M.D.. Gastroenterology and Hepatology [facility]

## 2023-06-06 NOTE — Anesthesia Preprocedure Evaluation (Signed)
Anesthesia Evaluation  Patient identified by MRN, date of birth, ID band Patient awake    Reviewed: Allergy & Precautions, H&P , NPO status , Patient's Chart, lab work & pertinent test results, reviewed documented beta blocker date and time   Airway Mallampati: II  TM Distance: >3 FB Neck ROM: full    Dental no notable dental hx.    Pulmonary asthma , sleep apnea , COPD, Current Smoker   Pulmonary exam normal breath sounds clear to auscultation       Cardiovascular Exercise Tolerance: Good hypertension, + angina  + CAD, + Past MI and + Cardiac Stents   Rhythm:regular Rate:Normal     Neuro/Psych  PSYCHIATRIC DISORDERS Anxiety Depression     Neuromuscular disease    GI/Hepatic Neg liver ROS, hiatal hernia,GERD  ,,  Endo/Other  negative endocrine ROS    Renal/GU negative Renal ROS  negative genitourinary   Musculoskeletal   Abdominal   Peds  Hematology negative hematology ROS (+)   Anesthesia Other Findings   Reproductive/Obstetrics negative OB ROS                             Anesthesia Physical Anesthesia Plan  ASA: 3  Anesthesia Plan: General   Post-op Pain Management:    Induction:   PONV Risk Score and Plan: Propofol infusion  Airway Management Planned:   Additional Equipment:   Intra-op Plan:   Post-operative Plan:   Informed Consent: I have reviewed the patients History and Physical, chart, labs and discussed the procedure including the risks, benefits and alternatives for the proposed anesthesia with the patient or authorized representative who has indicated his/her understanding and acceptance.     Dental Advisory Given  Plan Discussed with: CRNA  Anesthesia Plan Comments:        Anesthesia Quick Evaluation

## 2023-06-06 NOTE — Op Note (Signed)
[facility] Patient Name: [name] Procedure Date: 06/06/2023 10:21 AM MRN: [id number] Date of Birth: Oct 22, 1955 Attending MD: Sanjuan Dame , MD, [id number] CSN: [id number] Age: 67 Admit Type: Outpatient Procedure:                Colonoscopy Indications:              Hematochezia Providers:                Sanjuan Dame, MD, Angelica Ran, Dyann Ruddle Referring MD:             Sanjuan Dame, MD Medicines:                Monitored Anesthesia Care Complications:            No immediate complications. Estimated Blood Loss:     Estimated blood loss: none. Procedure:                Pre-Anesthesia Assessment:                           - Prior to the procedure, a History and Physical                            was performed, and patient medications and                            allergies were reviewed. The patient's tolerance of                            previous anesthesia was also reviewed. The risks                            and benefits of the procedure and the sedation                            options and risks were discussed with the patient.                            All questions were answered, and informed consent                            was obtained. Prior Anticoagulants: The patient has                            taken no anticoagulant or antiplatelet agents                            except for aspirin. ASA Grade Assessment: III - A                            patient with severe systemic disease. After                            reviewing the risks and benefits, the patient was  deemed in satisfactory condition to undergo the                            procedure.                           After obtaining informed consent, the colonoscope                            was passed under direct vision. Throughout the                            procedure, the patient's blood pressure, pulse, and                            oxygen saturations  were monitored continuously. The                            [id number]) scope was introduced through the                            anus and advanced to the 20 cm into the ileum. The                            colonoscopy was performed without difficulty. The                            patient tolerated the procedure well. The quality                            of the bowel preparation was evaluated using the                            BBPS [facility] Bowel Preparation Scale) with scores                            of: Right Colon = 3, Transverse Colon = 3 and Left                            Colon = 3 (entire mucosa seen well with no residual                            staining, small fragments of stool or opaque                            liquid). The total BBPS score equals 9. The                            terminal ileum, ileocecal valve, appendiceal                            orifice, and rectum were photographed. Scope In: 10:39:18 AM Scope Out: 10:55:30 AM Scope Withdrawal Time: 0 hours 13 minutes 57  seconds  Total Procedure Duration: 0 hours 16 minutes 12 seconds  Findings:      Hemorrhoids were found on perianal exam.      A few small-mouthed diverticula were found in the sigmoid colon.      Non-bleeding external and internal hemorrhoids were found during       retroflexion. The hemorrhoids were medium-sized.      The terminal ileum appeared normal. Impression:               - Hemorrhoids found on perianal exam.                           - Diverticulosis in the sigmoid colon.                           - Non-bleeding external and internal hemorrhoids.                           - The examined portion of the ileum was normal.                           - No specimens collected. Moderate Sedation:      Per Anesthesia Care Recommendation:           - Patient has a contact number available for                            emergencies. The signs and symptoms of potential                             delayed complications were discussed with the                            patient. Return to normal activities tomorrow.                            Written discharge instructions were provided to the                            patient.                           - High fiber diet.                           - Continue present medications.                           - Repeat colonoscopy in 10 years for screening                            purposes.                           - Return to GI office as previously scheduled. Procedure Code(s):        --- Professional ---  40981, Colonoscopy, flexible; diagnostic, including                            collection of specimen(s) by brushing or washing,                            when performed (separate procedure) Diagnosis Code(s):        --- Professional ---                           K64.8, Other hemorrhoids                           K92.1, Melena (includes Hematochezia)                           K57.30, Diverticulosis of large intestine without                            perforation or abscess without bleeding CPT copyright 2022 American Medical Association. All rights reserved. The codes documented in this report are preliminary and upon coder review may  be revised to meet current compliance requirements. Sanjuan Dame, MD Sanjuan Dame, MD 06/06/2023 11:01:59 AM This report has been signed electronically. Number of Addenda: 0

## 2023-06-06 NOTE — Transfer of Care (Signed)
Immediate Anesthesia Transfer of Care Note  Patient: [name]  Procedure(s) Performed: COLONOSCOPY WITH PROPOFOL  Patient Location: Endoscopy Unit  Anesthesia Type:General  Level of Consciousness: drowsy  Airway & Oxygen Therapy: Patient Spontanous Breathing  Post-op Assessment: Report given to RN and Post -op Vital signs reviewed and stable  Post vital signs: Reviewed and stable  Last Vitals:  Vitals Value Taken Time  BP    Temp    Pulse    Resp    SpO2      Last Pain:  Vitals:   06/06/23 1035  TempSrc:   PainSc: 0-No pain      Patients Stated Pain Goal: 6 (06/06/23 0915)  Complications: No notable events documented.

## 2023-06-06 NOTE — Interval H&P Note (Signed)
History and Physical Interval Note:  06/06/2023 9:38 AM  [name]  has presented today for surgery, with the diagnosis of HEMATOCHEZIA.  The various methods of treatment have been discussed with the patient and family. After consideration of risks, benefits and other options for treatment, the patient has consented to  Procedure(s) with comments: COLONOSCOPY WITH PROPOFOL (N/A) - 1:50PM;ASA 1-2 as a surgical intervention.  The patient's history has been reviewed, patient examined, no change in status, stable for surgery.  I have reviewed the patient's chart and labs.  Questions were answered to the patient's satisfaction.     [name]

## 2023-06-07 NOTE — Anesthesia Postprocedure Evaluation (Signed)
Anesthesia Post Note  Patient: [name]  Procedure(s) Performed: COLONOSCOPY WITH PROPOFOL  Patient location during evaluation: Phase II Anesthesia Type: General Level of consciousness: awake Pain management: pain level controlled Vital Signs Assessment: post-procedure vital signs reviewed and stable Respiratory status: spontaneous breathing and respiratory function stable Cardiovascular status: blood pressure returned to baseline and stable Postop Assessment: no headache and no apparent nausea or vomiting Anesthetic complications: no Comments: Late entry   No notable events documented.   Last Vitals:  Vitals:   06/06/23 0915 06/06/23 1059  BP: 116/77 100/69  Pulse: 71 64  Resp: 13 (!) 22  Temp: 36.4 C 36.8 C  SpO2: 94% 99%    Last Pain:  Vitals:   06/06/23 1059  TempSrc: Axillary  PainSc: 0-No pain                 Windell Norfolk

## 2023-06-20 ENCOUNTER — Ambulatory Visit: Payer: Medicare HMO | Admitting: Family Medicine

## 2023-06-23 ENCOUNTER — Other Ambulatory Visit: Payer: Self-pay | Admitting: Primary Care

## 2023-06-23 ENCOUNTER — Ambulatory Visit (INDEPENDENT_AMBULATORY_CARE_PROVIDER_SITE_OTHER): Payer: Medicare PPO | Admitting: Family Medicine

## 2023-06-23 VITALS — BP 136/88 | HR 56 | Temp 97.3°F | Ht 70.0 in | Wt 167.0 lb

## 2023-06-23 DIAGNOSIS — I10 Essential (primary) hypertension: Secondary | ICD-10-CM | POA: Diagnosis not present

## 2023-06-23 DIAGNOSIS — I25118 Atherosclerotic heart disease of native coronary artery with other forms of angina pectoris: Secondary | ICD-10-CM

## 2023-06-23 DIAGNOSIS — E876 Hypokalemia: Secondary | ICD-10-CM | POA: Diagnosis not present

## 2023-06-23 DIAGNOSIS — Z79891 Long term (current) use of opiate analgesic: Secondary | ICD-10-CM | POA: Diagnosis not present

## 2023-06-23 DIAGNOSIS — E782 Mixed hyperlipidemia: Secondary | ICD-10-CM

## 2023-06-23 MED ORDER — HYDROCODONE-ACETAMINOPHEN 5-325 MG PO TABS: ORAL_TABLET | ORAL | 0 refills | Status: DC

## 2023-06-23 NOTE — Progress Notes (Signed)
 Subjective:    Patient ID: [name], male    DOB: 04-08-1956, 68 y.o.   MRN: [id number]  Discussed the use of AI scribe software for clinical note transcription with the patient, who gave verbal consent to proceed.  History of Present Illness   The patient, with a history of cardiovascular disease and chronic obstructive pulmonary disease (COPD), recently moved into a new home. He reports his breathing has been fair with occasional bouts of coughing, but no hemoptysis. He has been trying to reduce his smoking, with a pack of cigarettes lasting about three days. He occasionally experiences an unusual sensation in his heart, but denies any chest pain during physical activity. He has a sore spot on his chest, presumably from a previous surgical intervention.  His mood has been stable and he continues to see his psychiatrist. He is due for routine blood work to monitor his cholesterol levels. He has been taking hydrocodone  for pain management, which he reports as helpful and keeps in a secure location.  He underwent a colonoscopy due to rectal bleeding, which was determined to be due to internal hemorrhoids. No malignancies or growths were found. He reports his bowel movements are typically soft and occur multiple times a day, especially after drinking coffee.  He experienced a recent episode of dizziness resulting in a fall, which was attributed to a drop in blood pressure. He is currently managing his COPD with Trelegy and occasional use of albuterol  for wheezing. He requests a refill of his hydrocodone  prescription, which he typically takes once in the morning and once in the evening.         Review of Systems     Objective:    Physical Exam   CHEST: No abnormalities noted on auscultation. EXTREMITIES: No edema in legs.    General-in no acute distress Eyes-no discharge Lungs-respiratory rate normal, CTA CV-no murmurs,RRR Extremities skin warm dry no edema Neuro grossly  normal Behavior normal, alert        Assessment & Plan:  Assessment and Plan    Chronic Obstructive Pulmonary Disease (COPD) Reports fair breathing with occasional coughing. No hemoptysis. Still smoking but trying to cut back. Uses Trelegy regularly and Albuterol  occasionally. -Continue current inhaler regimen. -Encourage further smoking cessation.  Cardiovascular Disease Reports occasional funny feeling in heart but no chest pain. Last seen by cardiology in the fall with no major concerns. -No immediate changes to management.  Chronic Pain Reports benefit from Hydrocodone , taken once in the morning and once in the evening. -Continue Hydrocodone  as prescribed. -Advise taking evening dose around 4pm for better pain control.  Rectal Bleeding Reports occasional blood in stool. Recent colonoscopy showed internal hemorrhoids but no cancer or growths. -No immediate changes to management.  General Health Maintenance -Order cholesterol panel. -Plan for follow-up in three months.     1. Low blood potassium (Primary) Metabolic 7 ordered Continue current measures Healthy diet  2. Essential hypertension HTN- patient seen for follow-up regarding HTN.   Diet, medication compliance, appropriate labs and refills were completed.   Importance of keeping blood pressure under good control to lessen the risk of complications discussed Regular follow-up visits discussed  - Basic Metabolic Panel  3. Encounter for long-term opiate analgesic use The patient was seen in followup for chronic pain. A review over at their current pain status was discussed. Drug registry was checked. Prescriptions were given.  Regular follow-up recommended. Discussion was held regarding the importance of compliance with medication as well  as pain medication contract.  Patient was informed that medication may cause drowsiness and should not be combined  with other medications/alcohol or street drugs. If the  patient feels medication is causing altered alertness then do not drive or operate dangerous equipment.  Should be noted that the patient appears to be meeting appropriate use of opioids and response.  Evidenced by improved function and decent pain control without significant side effects and no evidence of overt aberrancy issues.  Upon discussion with the patient today they understand that opioid therapy is optional and they feel that the pain has been refractory to reasonable conservative measures and is significant and affecting quality of life enough to warrant ongoing therapy and wishes to continue opioids.  Refills were provided.  [place]  medical Board guidelines regarding the pain medicine has been reviewed.  CDC guidelines most updated 2022 has been reviewed by the prescriber.  PDMP is checked on a regular basis yearly urine drug screen and pain management contract  Patient utilizes between 1-1/2 to 2 tablets/day He denies this causing him drowsiness Patient has been warned not to drive if feeling drowsy Also patient warned that pain medicine if he does have to use it is best taken during the day and not at bedtime  4. Mixed hyperlipidemia Lipid profile continue statin - Lipid Panel  5. Coronary artery disease involving native coronary artery of native heart with other form of angina pectoris (HCC) Occasionally gets a little out of breath when he pushes himself denies any true angina symptoms patient follows with cardiology on a yearly basis do not feel other test necessary currently patient to notify us  if having any progressive troubles patient encouraged to quit smoking 100% Follow-up 3 months

## 2023-06-28 DIAGNOSIS — E782 Mixed hyperlipidemia: Secondary | ICD-10-CM | POA: Diagnosis not present

## 2023-06-28 DIAGNOSIS — I10 Essential (primary) hypertension: Secondary | ICD-10-CM | POA: Diagnosis not present

## 2023-06-29 ENCOUNTER — Encounter: Payer: Self-pay | Admitting: Family Medicine

## 2023-06-29 LAB — LIPID PANEL
Chol/HDL Ratio: 3.5 {ratio} (ref 0.0–5.0)
Cholesterol, Total: 137 mg/dL (ref 100–199)
HDL: 39 mg/dL — ABNORMAL LOW (ref >39)
LDL Chol Calc (NIH): 63 mg/dL (ref 0–99)
Triglycerides: 212 mg/dL — ABNORMAL HIGH (ref 0–149)
VLDL Cholesterol Cal: 35 mg/dL (ref 5–40)

## 2023-06-29 LAB — BASIC METABOLIC PANEL
BUN/Creatinine Ratio: 14 (ref 10–24)
BUN: 12 mg/dL (ref 8–27)
CO2: 23 mmol/L (ref 20–29)
Calcium: 9.2 mg/dL (ref 8.6–10.2)
Chloride: 105 mmol/L (ref 96–106)
Creatinine, Ser: 0.84 mg/dL (ref 0.76–1.27)
Glucose: 76 mg/dL (ref 70–99)
Potassium: 4.4 mmol/L (ref 3.5–5.2)
Sodium: 143 mmol/L (ref 134–144)
eGFR: 96 mL/min/{1.73_m2} (ref >59)

## 2023-06-29 NOTE — Progress Notes (Signed)
 Please mail to patient

## 2023-08-03 ENCOUNTER — Other Ambulatory Visit: Payer: Self-pay | Admitting: Family Medicine

## 2023-08-03 ENCOUNTER — Other Ambulatory Visit (HOSPITAL_COMMUNITY): Payer: Self-pay | Admitting: Psychiatry

## 2023-08-10 ENCOUNTER — Other Ambulatory Visit (HOSPITAL_COMMUNITY): Payer: Self-pay | Admitting: Psychiatry

## 2023-09-11 ENCOUNTER — Other Ambulatory Visit: Payer: Self-pay | Admitting: Family Medicine

## 2023-09-20 ENCOUNTER — Ambulatory Visit (HOSPITAL_COMMUNITY): Payer: Medicare HMO | Admitting: Psychiatry

## 2023-09-21 ENCOUNTER — Encounter: Payer: Self-pay | Admitting: Family Medicine

## 2023-09-21 ENCOUNTER — Ambulatory Visit: Payer: Medicare PPO | Admitting: Family Medicine

## 2023-09-21 VITALS — BP 122/86 | HR 63 | Temp 97.7°F | Ht 70.0 in | Wt 162.0 lb

## 2023-09-21 DIAGNOSIS — R531 Weakness: Secondary | ICD-10-CM

## 2023-09-21 DIAGNOSIS — R42 Dizziness and giddiness: Secondary | ICD-10-CM

## 2023-09-21 DIAGNOSIS — R7303 Prediabetes: Secondary | ICD-10-CM | POA: Diagnosis not present

## 2023-09-21 DIAGNOSIS — Z79891 Long term (current) use of opiate analgesic: Secondary | ICD-10-CM | POA: Diagnosis not present

## 2023-09-21 DIAGNOSIS — E782 Mixed hyperlipidemia: Secondary | ICD-10-CM | POA: Diagnosis not present

## 2023-09-21 DIAGNOSIS — I6523 Occlusion and stenosis of bilateral carotid arteries: Secondary | ICD-10-CM

## 2023-09-21 DIAGNOSIS — Z79899 Other long term (current) drug therapy: Secondary | ICD-10-CM | POA: Diagnosis not present

## 2023-09-21 DIAGNOSIS — I25118 Atherosclerotic heart disease of native coronary artery with other forms of angina pectoris: Secondary | ICD-10-CM

## 2023-09-21 DIAGNOSIS — I10 Essential (primary) hypertension: Secondary | ICD-10-CM

## 2023-09-21 MED ORDER — HYDROCODONE-ACETAMINOPHEN 5-325 MG PO TABS: ORAL_TABLET | ORAL | 0 refills | Status: DC

## 2023-09-21 MED ORDER — MECLIZINE HCL 25 MG PO TABS: 25.0000 mg | ORAL_TABLET | Freq: Three times a day (TID) | ORAL | 0 refills | Status: DC | PRN

## 2023-09-21 NOTE — Progress Notes (Signed)
 Subjective:    Patient ID: [name], male    DOB: March 31, 1956, 68 y.o.   MRN: [id number]  HPI  3 month follow up  C/o dizzy spells on and off couple of months Discussed the use of AI scribe software for clinical note transcription with the patient, who gave verbal consent to proceed.  This patient was seen today for chronic pain  The medication list was reviewed and updated.   Location of Pain for which the patient has been treated with regarding narcotics: Lumbar spine knees  Onset of this pain: Present for years   -Compliance with medication: Good compliance  - Number patient states they take daily: 1-1/2 tablets daily  -Reason for ongoing use of opioids does not get adequate relief with Tylenol cannot take NSAIDs because of chronic health issues  What other measures have been tried outside of opioids Tylenol, NSAIDs  In the ongoing specialists regarding this condition none  -when was the last dose patient took?  Yesterday  The patient was advised the importance of maintaining medication and not using illegal substances with these.  Here for refills and follow up  The patient was educated that we can provide 3 monthly scripts for their medication, it is their responsibility to follow the instructions.  Side effects or complications from medications: Denies side effects  Patient is aware that pain medications are meant to minimize the severity of the pain to allow their pain levels to improve to allow for better function. They are aware of that pain medications cannot totally remove their pain.  Due for UDT ( at least once per year) (pain management contract is also completed at the time of the UDT): Patient gets this on a yearly basis  Scale of 1 to 10 ( 1 is least 10 is most) Your pain level without the medicine: 8 Your pain level with medication 3-4  Scale 1 to 10 ( 1-helps very little, 10 helps very well) How well does your pain medication reduce your pain  so you can function better through out the day?  7  Quality of the pain: Throbbing aching  Persistence of the pain: Present all the time  Modifying factors: Worse with activity      History of Present Illness   [name] is a 68 year old male who presents with dizzy spells and shaking. He lives with his grandson, who is around 51 years old and helps him around the house when not working.  He experiences dizzy spells characterized by a sensation of unsteadiness and the room spinning. These episodes vary in duration and occur intermittently, not daily. Sitting down and remaining still provides some relief. He has fallen several times due to these spells.  He experiences shaking, described as a nervous feeling without a clear cause. The shaking is severe enough to prevent him from holding a drink, affecting both hands.  He takes hydrocodone twice daily, one pill in the morning and a half pill later in the day, for pain management. He also takes Xanax, one in the morning and sometimes one at night, although it was previously prescribed three times a day. He adjusts the dosage based on how he feels. He takes his cholesterol and blood pressure medications regularly.  He has trouble sleeping, waking up throughout the night, and experiences coughing and congestion. He mentions feeling weak and having low energy, which affects his ability to perform activities.  He monitors his blood pressure at home, noting occasional  elevations when feeling unwell.  He maintains a regular diet, eating breakfast, lunch, and dinner, and engages in some physical activity like walking to the mailbox.      Refill pain meds  Review of Systems     Objective:   Physical Exam General-in no acute distress Eyes-no discharge Lungs-respiratory rate normal, CTA CV-no murmurs,RRR Extremities skin warm dry no edema Neuro grossly normal Behavior normal, alert        Assessment & Plan:  Assessment and  Plan    Dizziness Intermittent dizziness with vertigo and tremors. Differential includes vestibular dysfunction or cerebrovascular insufficiency. No stroke signs noted. - Order blood work. - Schedule ultrasound of neck arteries.  Chronic Pain Chronic pain managed with hydrocodone, providing partial relief. Sleep disturbances noted. - Continue hydrocodone. - Monitor pain levels and adjust treatment.  Hypertension Blood pressure 122/86 mmHg. Occasional home monitoring elevations when unwell. - Continue current antihypertensive regimen. - Monitor blood pressure regularly at home.  Mental Health Management On Xanax with psychiatric follow-up every four to five months. - Continue current mental health medication regimen. - Follow up with psychiatrist as scheduled.  General Health Maintenance Regularly taking cholesterol medication. Routine blood work due. - Order blood work for routine monitoring.      1. Essential hypertension (Primary) HTN- patient seen for follow-up regarding HTN.   Diet, medication compliance, appropriate labs and refills were completed.   Importance of keeping blood pressure under good control to lessen the risk of complications discussed Regular follow-up visits discussed   - Basic Metabolic Panel - Hepatic Function Panel - CBC with Differential  2. Mixed hyperlipidemia .blip  - Lipid Panel - Hepatic Function Panel - CBC with Differential  3. Encounter for long-term opiate analgesic use The patient was seen in followup for chronic pain. A review over at their current pain status was discussed. Drug registry was checked. Prescriptions were given.  Regular follow-up recommended. Discussion was held regarding the importance of compliance with medication as well as pain medication contract.  Patient was informed that medication may cause drowsiness and should not be combined  with other medications/alcohol or street drugs. If the patient feels  medication is causing altered alertness then do not drive or operate dangerous equipment.  Should be noted that the patient appears to be meeting appropriate use of opioids and response.  Evidenced by improved function and decent pain control without significant side effects and no evidence of overt aberrancy issues.  Upon discussion with the patient today they understand that opioid therapy is optional and they feel that the pain has been refractory to reasonable conservative measures and is significant and affecting quality of life enough to warrant ongoing therapy and wishes to continue opioids.  Refills were provided.  [facility] medical Board guidelines regarding the pain medicine has been reviewed.  CDC guidelines most updated 2022 has been reviewed by the prescriber.  PDMP is checked on a regular basis yearly urine drug screen and pain management contract  Treatment plan for this patient includes #1-gentle stretching exercises as shown daily basis 2.  Mild strength exercises 3 times per week #3 continue pain medications #4 notify us if any digression   4. Coronary artery disease involving native coronary artery of native heart with other form of angina pectoris [facility]) Patient was encouraged to eat healthy continue his medications see cardiology at least yearly he was also encouraged to quit smoking - US Carotid Duplex Bilateral  5. Weakness He relates feeling fatigued and weak.  He does not do any exercise.  He is getting older.  We will check for underlying anemia - CBC with Differential  6. Dizziness Patient has intermittent spells of dizziness that he describes sometimes as feeling unsteady other times as feeling like the room is moving.  On physical exam I find no evidence of stroke.  He is on quite a few medications including pain medicine Xanax and psychiatric medicines these could be contributing to the dizziness.  He denies feeling drugged with the medicines.  We will do  carotid studies  7. Bilateral carotid artery stenosis Carotid ultrasound quit smoking keep cholesterol under control  8. Prediabetes Portion control regular physical activity - Hepatic Function Panel - CBC with Differential

## 2023-09-22 ENCOUNTER — Encounter: Payer: Self-pay | Admitting: Family Medicine

## 2023-09-22 ENCOUNTER — Other Ambulatory Visit: Payer: Self-pay

## 2023-09-22 DIAGNOSIS — E782 Mixed hyperlipidemia: Secondary | ICD-10-CM

## 2023-09-22 LAB — CBC WITH DIFFERENTIAL/PLATELET
Basophils Absolute: 0 10*3/uL (ref 0.0–0.2)
Basos: 0 %
EOS (ABSOLUTE): 0 10*3/uL (ref 0.0–0.4)
Eos: 0 %
Hematocrit: 47.5 % (ref 37.5–51.0)
Hemoglobin: 16.5 g/dL (ref 13.0–17.7)
Immature Grans (Abs): 0 10*3/uL (ref 0.0–0.1)
Immature Granulocytes: 0 %
Lymphocytes Absolute: 2.1 10*3/uL (ref 0.7–3.1)
Lymphs: 30 %
MCH: 32.7 pg (ref 26.6–33.0)
MCHC: 34.7 g/dL (ref 31.5–35.7)
MCV: 94 fL (ref 79–97)
Monocytes Absolute: 0.4 10*3/uL (ref 0.1–0.9)
Monocytes: 6 %
Neutrophils Absolute: 4.5 10*3/uL (ref 1.4–7.0)
Neutrophils: 64 %
Platelets: 221 10*3/uL (ref 150–450)
RBC: 5.05 x10E6/uL (ref 4.14–5.80)
RDW: 13.1 % (ref 11.6–15.4)
WBC: 7.1 10*3/uL (ref 3.4–10.8)

## 2023-09-22 LAB — LIPID PANEL
Chol/HDL Ratio: 3.7 ratio (ref 0.0–5.0)
Cholesterol, Total: 139 mg/dL (ref 100–199)
HDL: 38 mg/dL — ABNORMAL LOW (ref >39)
LDL Chol Calc (NIH): 80 mg/dL (ref 0–99)
Triglycerides: 116 mg/dL (ref 0–149)
VLDL Cholesterol Cal: 21 mg/dL (ref 5–40)

## 2023-09-22 LAB — BASIC METABOLIC PANEL WITH GFR
BUN/Creatinine Ratio: 11 (ref 10–24)
BUN: 8 mg/dL (ref 8–27)
CO2: 24 mmol/L (ref 20–29)
Calcium: 9.1 mg/dL (ref 8.6–10.2)
Chloride: 102 mmol/L (ref 96–106)
Creatinine, Ser: 0.73 mg/dL — ABNORMAL LOW (ref 0.76–1.27)
Glucose: 104 mg/dL — ABNORMAL HIGH (ref 70–99)
Potassium: 3.7 mmol/L (ref 3.5–5.2)
Sodium: 142 mmol/L (ref 134–144)
eGFR: 100 mL/min/{1.73_m2} (ref >59)

## 2023-09-22 LAB — HEPATIC FUNCTION PANEL
ALT: 20 IU/L (ref 0–44)
AST: 18 IU/L (ref 0–40)
Albumin: 4.3 g/dL (ref 3.9–4.9)
Alkaline Phosphatase: 109 IU/L (ref 44–121)
Bilirubin Total: 0.6 mg/dL (ref 0.0–1.2)
Bilirubin, Direct: 0.25 mg/dL (ref 0.00–0.40)
Total Protein: 6.8 g/dL (ref 6.0–8.5)

## 2023-09-22 MED ORDER — EZETIMIBE 10 MG PO TABS: 10.0000 mg | ORAL_TABLET | Freq: Every day | ORAL | 3 refills | Status: AC

## 2023-09-22 NOTE — Progress Notes (Signed)
 Please mail to patient

## 2023-09-23 ENCOUNTER — Ambulatory Visit: Payer: Medicare PPO

## 2023-10-05 ENCOUNTER — Other Ambulatory Visit: Payer: Self-pay | Admitting: Family Medicine

## 2023-10-05 MED ORDER — ATORVASTATIN CALCIUM 80 MG PO TABS: 80.0000 mg | ORAL_TABLET | Freq: Every day | ORAL | 3 refills | Status: AC

## 2023-11-22 ENCOUNTER — Encounter: Payer: Self-pay | Admitting: Cardiology

## 2023-11-25 ENCOUNTER — Other Ambulatory Visit (HOSPITAL_COMMUNITY): Payer: Self-pay | Admitting: Psychiatry

## 2023-11-25 NOTE — Telephone Encounter (Signed)
 Call for appt

## 2023-12-09 ENCOUNTER — Other Ambulatory Visit (HOSPITAL_COMMUNITY): Payer: Self-pay | Admitting: Psychiatry

## 2023-12-11 NOTE — Telephone Encounter (Signed)
 Call for appt

## 2023-12-19 NOTE — Telephone Encounter (Signed)
Called no answer left vm 

## 2023-12-23 ENCOUNTER — Ambulatory Visit: Admitting: Family Medicine

## 2023-12-23 VITALS — BP 101/65 | HR 68 | Temp 98.8°F | Ht 70.0 in | Wt 156.2 lb

## 2023-12-23 DIAGNOSIS — R42 Dizziness and giddiness: Secondary | ICD-10-CM

## 2023-12-23 DIAGNOSIS — K625 Hemorrhage of anus and rectum: Secondary | ICD-10-CM

## 2023-12-23 DIAGNOSIS — E782 Mixed hyperlipidemia: Secondary | ICD-10-CM

## 2023-12-23 DIAGNOSIS — I10 Essential (primary) hypertension: Secondary | ICD-10-CM

## 2023-12-23 DIAGNOSIS — Z79891 Long term (current) use of opiate analgesic: Secondary | ICD-10-CM

## 2023-12-23 DIAGNOSIS — R7303 Prediabetes: Secondary | ICD-10-CM

## 2023-12-23 MED ORDER — ROPINIROLE HCL 2 MG PO TABS: ORAL_TABLET | ORAL | 3 refills | Status: DC

## 2023-12-23 MED ORDER — HYDROCODONE-ACETAMINOPHEN 5-325 MG PO TABS: ORAL_TABLET | ORAL | 0 refills | Status: DC

## 2023-12-23 MED ORDER — MECLIZINE HCL 25 MG PO TABS: 25.0000 mg | ORAL_TABLET | Freq: Three times a day (TID) | ORAL | 3 refills | Status: DC | PRN

## 2023-12-23 MED ORDER — VALSARTAN 40 MG PO TABS: 40.0000 mg | ORAL_TABLET | Freq: Every day | ORAL | 1 refills | Status: DC

## 2023-12-23 MED ORDER — TAMSULOSIN HCL 0.4 MG PO CAPS: 0.4000 mg | ORAL_CAPSULE | Freq: Every day | ORAL | 3 refills | Status: DC

## 2023-12-23 MED ORDER — AMLODIPINE BESYLATE 5 MG PO TABS: ORAL_TABLET | ORAL | 3 refills | Status: DC

## 2023-12-23 NOTE — Progress Notes (Signed)
 Subjective:    Patient ID: [name], male    DOB: Oct 31, 1955, 68 y.o.   MRN: [id number]  HPI Patient does have chronic pain.  Patient does relate compliance with taking the medication as directed.  Patient denies negative side effects.  Patient states pain medicine does help with function.  Drug registry was checked.  Patient understands the importance of never driving if feeling sedated.  Patient understands the importance of notifying us  if any problems with pain medicine.  Patient denies medication being used by anyone else.  Patient relates medication is kept in a safe spot.   This patient was seen today for chronic pain  The medication list was reviewed and updated.   Location of Pain for which the patient has been treated with regarding narcotics: Lumbar pain also knee pain  Onset of this pain: Present for years   -Compliance with medication: Good compliance with medicine  - Number patient states they take daily: Typically 1 occasionally 2 tablets  -Reason for ongoing use of opioids does not get adequate relief with Tylenol   What other measures have been tried outside of opioids Tylenol , NSAIDs  In the ongoing specialists regarding this condition none currently  -when was the last dose patient took?  Past 24 hours  The patient was advised the importance of maintaining medication and not using illegal substances with these.  Here for refills and follow up  The patient was educated that we can provide 3 monthly scripts for their medication, it is their responsibility to follow the instructions.  Side effects or complications from medications: No side effects  Patient is aware that pain medications are meant to minimize the severity of the pain to allow their pain levels to improve to allow for better function. They are aware of that pain medications cannot totally remove their pain.  Due for UDT ( at least once per year) (pain management contract is also completed at  the time of the UDT): On next visit  Scale of 1 to 10 ( 1 is least 10 is most) Your pain level without the medicine: 5 Your pain level with medication 2  Scale 1 to 10 ( 1-helps very little, 10 helps very well) How well does your pain medication reduce your pain so you can function better through out the day?  7  Quality of the pain: Throbbing aching  Persistence of the pain: Present all time  Modifying factors: Worse with activity       Review of Systems     Objective:   Physical Exam General-in no acute distress Eyes-no discharge Lungs-respiratory rate normal, CTA CV-no murmurs,RRR Extremities skin warm dry no edema Neuro grossly normal Behavior normal, alert        Assessment & Plan:  1. Mixed hyperlipidemia (Primary) Continue statin healthy diet regular activity  2. Essential hypertension Continue blood pressure medicine healthy diet - CBC with Differential  3. Prediabetes Previous A1c's look good check yearly - CBC with Differential  4. Dizziness Blood pressure medicine too intense back down on the strength we will go with valsartan  40 mg  5. Rectal bleeding Patient had a previous colonoscopy last year that looked good but he has hemorrhoids and he had some bleeding after having intestinal bug he states this is now doing better  6. Encounter for long-term opiate analgesic use The patient was seen in followup for chronic pain. A review over at their current pain status was discussed. Drug registry was checked. Prescriptions were  given.  Regular follow-up recommended. Discussion was held regarding the importance of compliance with medication as well as pain medication contract.  Patient was informed that medication may cause drowsiness and should not be combined  with other medications/alcohol or street drugs. If the patient feels medication is causing altered alertness then do not drive or operate dangerous equipment.  Should be noted that the patient  appears to be meeting appropriate use of opioids and response.  Evidenced by improved function and decent pain control without significant side effects and no evidence of overt aberrancy issues.  Upon discussion with the patient today they understand that opioid therapy is optional and they feel that the pain has been refractory to reasonable conservative measures and is significant and affecting quality of life enough to warrant ongoing therapy and wishes to continue opioids.  Refills were provided.  [employer]  medical Board guidelines regarding the pain medicine has been reviewed.  CDC guidelines most updated 2022 has been reviewed by the prescriber.  PDMP is checked on a regular basis yearly urine drug screen and pain management contract  Treatment plan for this patient includes #1-gentle stretching exercises as shown daily basis 2.  Mild strength exercises 3 times per week #3 continue pain medications #4 notify us  if any digression Prescription sent in follow-up 3 months  Patient does take alprazolam  that he states medications he has intolerance to those medicines does not cause drowsiness

## 2023-12-24 LAB — CBC WITH DIFFERENTIAL/PLATELET
Basophils Absolute: 0 x10E3/uL (ref 0.0–0.2)
Basos: 1 %
EOS (ABSOLUTE): 0.1 x10E3/uL (ref 0.0–0.4)
Eos: 1 %
Hematocrit: 42.8 % (ref 37.5–51.0)
Hemoglobin: 15 g/dL (ref 13.0–17.7)
Immature Grans (Abs): 0 x10E3/uL (ref 0.0–0.1)
Immature Granulocytes: 0 %
Lymphocytes Absolute: 2.2 x10E3/uL (ref 0.7–3.1)
Lymphs: 29 %
MCH: 34.4 pg — ABNORMAL HIGH (ref 26.6–33.0)
MCHC: 35 g/dL (ref 31.5–35.7)
MCV: 98 fL — ABNORMAL HIGH (ref 79–97)
Monocytes Absolute: 0.4 x10E3/uL (ref 0.1–0.9)
Monocytes: 6 %
Neutrophils Absolute: 4.9 x10E3/uL (ref 1.4–7.0)
Neutrophils: 63 %
Platelets: 187 x10E3/uL (ref 150–450)
RBC: 4.36 x10E6/uL (ref 4.14–5.80)
RDW: 13.2 % (ref 11.6–15.4)
WBC: 7.7 x10E3/uL (ref 3.4–10.8)

## 2023-12-25 ENCOUNTER — Ambulatory Visit: Payer: Self-pay | Admitting: Family Medicine

## 2023-12-26 ENCOUNTER — Telehealth: Payer: Self-pay | Admitting: Family Medicine

## 2023-12-26 ENCOUNTER — Other Ambulatory Visit: Payer: Self-pay

## 2023-12-26 DIAGNOSIS — K921 Melena: Secondary | ICD-10-CM

## 2023-12-26 NOTE — Progress Notes (Signed)
 Hi Dr Alphonsa  I looked at colonoscopy report , does have medium size hemorrhoids and possible cause of self limiting hematochezia  I would get him set up with a follow up appointment with us  to reassess his symptoms  Thank you  Carnesha Maravilla

## 2023-12-26 NOTE — Telephone Encounter (Signed)
 Nurses Please touch base with Sharolyn Go ahead and refer him to Dr.[facility] in [place]-they did his colonoscopy  Please let the patient know that I did communicate with the gastroenterologist and they thought it would be best for their office to do a follow-up visit with you regarding the rectal bleeding even though it has cleared up

## 2023-12-26 NOTE — Progress Notes (Signed)
 Referral placed.

## 2023-12-27 ENCOUNTER — Other Ambulatory Visit: Payer: Self-pay

## 2023-12-27 DIAGNOSIS — K625 Hemorrhage of anus and rectum: Secondary | ICD-10-CM

## 2023-12-27 NOTE — Telephone Encounter (Signed)
 Called and left a message to inform patient referral to gastro has been placed.

## 2023-12-28 ENCOUNTER — Other Ambulatory Visit (HOSPITAL_COMMUNITY): Payer: Self-pay | Admitting: Psychiatry

## 2023-12-28 NOTE — Telephone Encounter (Signed)
 Call for appt

## 2024-01-13 ENCOUNTER — Ambulatory Visit

## 2024-01-13 VITALS — Ht 70.0 in | Wt 156.0 lb

## 2024-01-13 DIAGNOSIS — Z0000 Encounter for general adult medical examination without abnormal findings: Secondary | ICD-10-CM | POA: Diagnosis not present

## 2024-01-13 NOTE — Patient Instructions (Signed)
 [name] , Thank you for taking time out of your busy schedule to complete your Annual Wellness Visit with me. I enjoyed our conversation and look forward to speaking with you again next year. I, as well as your care team,  appreciate your ongoing commitment to your health goals. Please review the following plan we discussed and let me know if I can assist you in the future. Your Game plan/ To Do List     Follow up Visits: We will see or speak with you next year for your Next Medicare AWV with our clinical staff Have you seen your provider in the last 6 months (3 months if uncontrolled diabetes)? Yes  Clinician Recommendations:  Aim for 30 minutes of exercise or brisk walking, 6-8 glasses of water , and 5 servings of fruits and vegetables each day.       This is a list of the screenings recommended for you:  Health Maintenance  Topic Date Due   Zoster (Shingles) Vaccine (1 of 2) Never done   COVID-19 Vaccine (3 - Moderna risk series) 09/10/2019   Flu Shot  01/13/2024   Screening for Lung Cancer  05/23/2024   Medicare Annual Wellness Visit  01/12/2025   DTaP/Tdap/Td vaccine (3 - Td or Tdap) 01/31/2032   Colon Cancer Screening  06/05/2033   Pneumococcal Vaccine for age over 35  Completed   Hepatitis C Screening  Completed   Hepatitis B Vaccine  Aged Out   HPV Vaccine  Aged Out   Meningitis B Vaccine  Aged Out    Advanced directives: (ACP Link)Information on Advanced Care Planning can be found at [place]  Best boy Advance Health Care Directives Advance Health Care Directives. [email or web address]   Advance Care Planning is important because it:  [x]  Makes sure you receive the medical care that is consistent with your values, goals, and preferences  [x]  It provides guidance to your family and loved ones and reduces their decisional burden about whether or not they are making the right decisions based on your wishes.  Follow the link provided in your after visit summary or read  over the paperwork we have mailed to you to help you started getting your Advance Directives in place. If you need assistance in completing these, please reach out to us  so that we can help you!  See attachments for Preventive Care and Fall Prevention Tips.

## 2024-01-13 NOTE — Progress Notes (Signed)
 Subjective:   NYCHOLAS [name] is a 68 y.o. who presents for a Medicare Wellness preventive visit.  As a reminder, Annual Wellness Visits don't include a physical exam, and some assessments may be limited, especially if this visit is performed virtually. We may recommend an in-person follow-up visit with your provider if needed.  Visit Complete: Virtual I connected with  [name] on 01/13/24 by a audio enabled telemedicine application and verified that I am speaking with the correct person using two identifiers.  Patient Location: Home  Provider Location: Home Office  I discussed the limitations of evaluation and management by telemedicine. The patient expressed understanding and agreed to proceed.  Vital Signs: Because this visit was a virtual/telehealth visit, some criteria may be missing or patient reported. Any vitals not documented were not able to be obtained and vitals that have been documented are patient reported.  VideoDeclined- This patient declined Librarian, academic. Therefore the visit was completed with audio only.  Persons Participating in Visit: Patient.  AWV Questionnaire: No: Patient Medicare AWV questionnaire was not completed prior to this visit.  Cardiac Risk Factors include: advanced age (>86men, >1 women);male gender;smoking/ tobacco exposure;hypertension     Objective:    Today's Vitals   01/13/24 0924  Weight: 156 lb (70.8 kg)  Height: 5' 10 (1.778 m)   Body mass index is 22.38 kg/m.     01/13/2024    9:49 AM 06/06/2023    9:12 AM 04/21/2023    7:08 PM 01/30/2022    5:36 PM 11/03/2021   10:06 AM 03/10/2021    8:51 PM 09/17/2019   12:24 PM  Advanced Directives  Does Patient Have a Medical Advance Directive? No No No No No No No  Does patient want to make changes to medical advance directive?    No - Patient declined     Would patient like information on creating a medical advance directive? Yes  (MAU/Ambulatory/Procedural Areas - Information given) Yes (MAU/Ambulatory/Procedural Areas - Information given) No - Patient declined  No - Patient declined No - Patient declined No - Patient declined    Current Medications (verified) Outpatient Encounter Medications as of 01/13/2024  Medication Sig   albuterol  (VENTOLIN  HFA) 108 (90 Base) MCG/ACT inhaler Inhale 2 puffs into the lungs every 4 (four) hours as needed for wheezing or shortness of breath.   ALPRAZolam  (XANAX ) 1 MG tablet TAKE ONE TABLET BY MOUTH THREE TIMES DAILY   amLODipine  (NORVASC ) 5 MG tablet TAKE 1 TABLET EVERY DAY   aspirin  EC 81 MG tablet Take 81 mg by mouth daily.   atorvastatin  (LIPITOR ) 80 MG tablet Take 1 tablet (80 mg total) by mouth daily.   Cyanocobalamin  (VITAMIN B-12 PO) Take 1 tablet by mouth in the morning.   ezetimibe  (ZETIA ) 10 MG tablet Take 1 tablet (10 mg total) by mouth daily.   FLUoxetine  (PROZAC ) 40 MG capsule TAKE 1 CAPSULE TWICE DAILY   gabapentin  (NEURONTIN ) 300 MG capsule Take two tablets three times a day   HYDROcodone -acetaminophen  (NORCO/VICODIN) 5-325 MG tablet 1/2 to 1 tablet taken twice daily as needed for pain caution drowsiness prescription to last 30 days   HYDROcodone -acetaminophen  (NORCO/VICODIN) 5-325 MG tablet TAKE ONE TABLET BY MOUTH TWICE DAILY AS NEEDED FOR PAIN (TO LAST 30 DAYS)   HYDROcodone -acetaminophen  (NORCO/VICODIN) 5-325 MG tablet 1/2-1 twice daily as needed pain prescription to last 30 days   hydrocortisone  (ANUSOL -HC) 25 MG suppository Place 1 suppository (25 mg total) rectally 2 (two) times  daily.   isosorbide  mononitrate (IMDUR ) 30 MG 24 hr tablet Take 30 mg by mouth daily.   lipase/protease/amylase (CREON ) 36000 UNITS CPEP capsule Take 2 capsules (72,000 Units total) by mouth 3 (three) times daily with meals. May also take 1 capsule (36,000 Units total) as needed (with snacks).   meclizine  (ANTIVERT ) 25 MG tablet Take 1 tablet (25 mg total) by mouth 3 (three) times daily as  needed for dizziness.   meloxicam  (MOBIC ) 15 MG tablet TAKE 1 TABLET EVERY DAY   metoprolol  succinate (TOPROL -XL) 50 MG 24 hr tablet TAKE 1 TABLET EVERY DAY   mupirocin  ointment (BACTROBAN ) 2 % Apply thin amount bid to knee wound for 10 days   nitroGLYCERIN  (NITROSTAT ) 0.4 MG SL tablet Place 1 tablet (0.4 mg total) under the tongue every 5 (five) minutes as needed for chest pain.   OLANZapine  (ZYPREXA ) 10 MG tablet TAKE 1 TABLET AT BEDTIME   pantoprazole  (PROTONIX ) 40 MG tablet TAKE 1 TABLET TWICE DAILY BEFORE MEALS   rOPINIRole  (REQUIP ) 2 MG tablet TAKE 1 TABLET AT BEDTIME   tamsulosin  (FLOMAX ) 0.4 MG CAPS capsule Take 1 capsule (0.4 mg total) by mouth daily.   traZODone  (DESYREL ) 50 MG tablet TAKE 1 TABLET AT BEDTIME   TRELEGY ELLIPTA  100-62.5-25 MCG/ACT AEPB INHALE 1 PUFF EVERY DAY   valsartan  (DIOVAN ) 40 MG tablet Take 1 tablet (40 mg total) by mouth daily.   No facility-administered encounter medications on file as of 01/13/2024.    Allergies (verified) Patient has no known allergies.   History: Past Medical History:  Diagnosis Date   Anxiety    Anxiety and depression    ASCVD (arteriosclerotic cardiovascular disease)    a. s/p PTCA to LCx in 1993 b. BMS to RCA in 01/2012 with residual 80% OM2 stenosis and medical management recommended c. 08/2019: cath showing patent stents with occluded OM2 --> medical management. d. s/p CABG in 02/2021 with LIMA-LAD, reverse SVG-D1 and SVG-OM   Asthma    CAD (coronary artery disease)    Carcinoma in situ of colon 2004   rectal polyp   Colitis, ischemic (HCC) 2011   COPD (chronic obstructive pulmonary disease) (HCC)    mild ;excercise induced hypoxemia by cp stress test ;asthma ,bronchitis,   Depression    Diverticulosis    Gastritis 12/30/2010   EGD Dr Shaaron   GERD (gastroesophageal reflux disease)    GI bleed    H. pylori infection 2004   treated   Hemorrhoids    Hiatal hernia    Hyperlipidemia    Hypertension    Inflammatory  polyps of colon with rectal bleeding (HCC)    Leukocytosis    Dr Lake   Lung nodule 07/16/2011   Myocardial infarction [facility])    age 30   Restless leg syndrome    Schatzki's ring    Sleep apnea    does not use CPAP:cannot tolerate, PCP aware   Syncope    Tobacco abuse    50 pack years continuing at one halp pack daily   Tubular adenoma of colon 06/2009   Colonosocpy Dr Shaaron   Past Surgical History:  Procedure Laterality Date   BIOPSY  12/19/2017   Procedure: BIOPSY;  Surgeon: Shaaron Lamar HERO, MD;  [facility];  Service: Endoscopy;;  gastric   BIOPSY  11/05/2021   Procedure: BIOPSY;  Surgeon: Shaaron Lamar HERO, MD;  [facility];  Service: Endoscopy;;   CARDIAC CATHETERIZATION     CHOLECYSTECTOMY  2004  COLONOSCOPY  06/2009   normal terminal ileum, segmental mild inflammation of sigmoid colon (bx unremarkable), polyp, tubular adenoma   COLONOSCOPY N/A 07/31/2013   Dr.Rourk- redundant anal canal hemorrhoids, colonic diverticulosis, tubular adenoma   COLONOSCOPY W/ POLYPECTOMY  2004   rectal polyp with carcinoma in situ removed via colonoscopy   COLONOSCOPY WITH PROPOFOL  N/A 09/15/2015   Dr. Shaaron: Scattered diverticula throughout the colon, 2 sessile polyps found in the descending colon and cecum, 5 mm in size.  Cecal polyp was sessile serrated polyp, descending colon polyp was a tubular adenoma.  He had a abnormal perianal exam along with grade 3 hemorrhoids.  Surveillance exam recommended for 5-year follow-up.   COLONOSCOPY WITH PROPOFOL  N/A 12/19/2017   one 5 mm polyp in ascending colon and 1 cm sessile polyp in ascending s/p removal. Tubular adenoma. internal hemorrhoids   COLONOSCOPY WITH PROPOFOL  N/A 11/05/2021   Procedure: COLONOSCOPY WITH PROPOFOL ;  Surgeon: Shaaron Lamar HERO, MD;  [facility];  Service: Endoscopy;  Laterality: N/A;  12:30pm   CORONARY ANGIOPLASTY WITH STENT PLACEMENT  01/26/2012   1; total is now 2   CORONARY ARTERY BYPASS  GRAFT N/A 03/13/2021   Procedure: CORONARY ARTERY BYPASS GRAFTING (CABG), ON PUMP, TIMES THREE, USING LEFT INTERNAL MAMMARY ARTERY AND RIGHT ENDOSCOPICALLY HARVESTED GREATER SAPHENOUS VEIN;  Surgeon: Shyrl Linnie KIDD, MD;  Location: [facility];  Service: Open Heart Surgery;  Laterality: N/A;   CORONARY/GRAFT ACUTE MI REVASCULARIZATION N/A 03/10/2021   Procedure: Coronary/Graft Acute MI Revascularization;  Surgeon: Claudene Victory ORN, MD;  Location: [facility] INVASIVE CV LAB;  Service: Cardiovascular;  Laterality: N/A;   ENDOVEIN HARVEST OF GREATER SAPHENOUS VEIN Right 03/13/2021   Procedure: ENDOVEIN HARVEST OF GREATER SAPHENOUS VEIN;  Surgeon: Shyrl Linnie KIDD, MD;  Location: [facility] OR;  Service: Open Heart Surgery;  Laterality: Right;   ESOPHAGOGASTRODUODENOSCOPY  02/2009   query Barrett's but bx negative   ESOPHAGOGASTRODUODENOSCOPY  12/30/2010   Lamar HERO Rourk,gastritis, dilated 14F, sm HH, 1 small ulcer, Duodenal erosions, benign bx   ESOPHAGOGASTRODUODENOSCOPY (EGD) WITH ESOPHAGEAL DILATION N/A 09/12/2012   MFM:Wnwrmpuprjo Schatzki's ring s/p Maloney dilator. Small hiatal hernia. negative path   ESOPHAGOGASTRODUODENOSCOPY (EGD) WITH ESOPHAGEAL DILATION N/A 07/31/2013   Dr. Shaaron- normal egd, s/p [facility] dilation empirically. Normal small bowel biopsies    ESOPHAGOGASTRODUODENOSCOPY (EGD) WITH PROPOFOL  N/A 09/15/2015   Dr. Shaaron: Medium sized hiatal hernia, normal-appearing esophagus status post empiric dilation   ESOPHAGOGASTRODUODENOSCOPY (EGD) WITH PROPOFOL  N/A 12/19/2017   erosive esophagitis s/p dilation, erosive gastropathy, normal duodenum   ESOPHAGOGASTRODUODENOSCOPY (EGD) WITH PROPOFOL  N/A 09/17/2019   Non-obstructing Schatzki's ring s/p dilation, medium-sized hiatal hernia, otherwise normal.   ESOPHAGOGASTRODUODENOSCOPY (EGD) WITH PROPOFOL  N/A 11/05/2021   Procedure: ESOPHAGOGASTRODUODENOSCOPY (EGD) WITH PROPOFOL ;  Surgeon: Shaaron Lamar HERO, MD;  [facility];  Service: Endoscopy;  Laterality:  N/A;   FLEXIBLE SIGMOIDOSCOPY  12/30/2010    Lamar HERO Rourk,; internal hemorrhoids, anal papilla   HAND SURGERY     surgical intervention for injury of the fingers of the left hand many years ago   heart stent     HEMORRHOID BANDING     Dr. Shaaron   LEFT HEART CATH AND CORONARY ANGIOGRAPHY N/A 08/29/2019   Procedure: LEFT HEART CATH AND CORONARY ANGIOGRAPHY;  Surgeon: Anner Alm ORN, MD;  Location: [facility] INVASIVE CV LAB;  Service: Cardiovascular;  Laterality: N/A;   LEFT HEART CATH AND CORONARY ANGIOGRAPHY N/A 03/10/2021   Procedure: LEFT HEART CATH AND CORONARY ANGIOGRAPHY;  Surgeon: Claudene Victory  W, MD;  Location: [facility];  Service: Cardiovascular;  Laterality: N/A;   LEFT HEART CATHETERIZATION WITH CORONARY ANGIOGRAM N/A 01/26/2012   Procedure: LEFT HEART CATHETERIZATION WITH CORONARY ANGIOGRAM;  Surgeon: Lynwood Schilling, MD;  Location: [facility] CATH LAB;  Service: Cardiovascular;  Laterality: N/A;   MALONEY DILATION N/A 09/15/2015   Procedure: AGAPITO DILATION;  Surgeon: Lamar CHRISTELLA Hollingshead, MD;  [facility];  Service: Endoscopy;  Laterality: N/A;   MALONEY DILATION N/A 12/19/2017   Procedure: AGAPITO DILATION;  Surgeon: Hollingshead Lamar CHRISTELLA, MD;  [facility];  Service: Endoscopy;  Laterality: N/A;   MALONEY DILATION N/A 09/17/2019   Procedure: AGAPITO DILATION;  Surgeon: Hollingshead Lamar CHRISTELLA, MD;  [facility];  Service: Endoscopy;  Laterality: N/A;   MALONEY DILATION N/A 11/05/2021   Procedure: AGAPITO DILATION;  Surgeon: Hollingshead Lamar CHRISTELLA, MD;  [facility];  Service: Endoscopy;  Laterality: N/A;   NASAL SEPTOPLASTY W/ TURBINOPLASTY  10/06/2011   Procedure: NASAL SEPTOPLASTY WITH TURBINATE REDUCTION;  Surgeon: Ida Loader, MD;  Location: [facility];  Service: ENT;  Laterality: Bilateral;   PERCUTANEOUS CORONARY STENT INTERVENTION (PCI-S) N/A 01/26/2012   Procedure: PERCUTANEOUS CORONARY STENT INTERVENTION (PCI-S);  Surgeon: Lynwood Schilling, MD;  Location: [facility] CATH LAB;   Service: Cardiovascular;  Laterality: N/A;   POLYPECTOMY  09/15/2015   Procedure: POLYPECTOMY;  Surgeon: Lamar CHRISTELLA Hollingshead, MD;  [facility];  Service: Endoscopy;;  Cecal polyp removed via cold snare/ Descending colon polyp removed via cold snare   POLYPECTOMY  12/19/2017   Procedure: POLYPECTOMY;  Surgeon: Hollingshead Lamar CHRISTELLA, MD;  [facility];  Service: Endoscopy;;  colon   TEE WITHOUT CARDIOVERSION N/A 03/13/2021   Procedure: TRANSESOPHAGEAL ECHOCARDIOGRAM (TEE);  Surgeon: Shyrl Linnie KIDD, MD;  Location: [facility] OR;  Service: Open Heart Surgery;  Laterality: N/A;   Family History  Problem Relation Age of Onset   Lung disease Father        deceased, black lung   Heart disease Mother        blood clots   Depression Mother    Cancer Paternal Uncle        unknown type   Cancer Maternal Aunt        unknown type   Kidney failure Maternal Uncle    Hypertension Brother    Colon cancer Neg Hx    ADD / ADHD Neg Hx    Alcohol abuse Neg Hx    Drug abuse Neg Hx    Anxiety disorder Neg Hx    Bipolar disorder Neg Hx    Dementia Neg Hx    OCD Neg Hx    Paranoid behavior Neg Hx    Schizophrenia Neg Hx    Physical abuse Neg Hx    Sexual abuse Neg Hx    Seizures Neg Hx    Social History   Socioeconomic History   Marital status: Married    Spouse name: Not on file   Number of children: 3   Years of education: Not on file   Highest education level: Not on file  Occupational History   Occupation: disable    Employer: RETIRED    Comment: [employer]  Tobacco Use   Smoking status: Every Day    Current packs/day: 0.00    Average packs/day: 1 pack/day for 51.6 years (51.6 ttl pk-yrs)    Types: Cigarettes    Start date: 07/31/1969    Last attempt to quit: 03/16/2021  Years since quitting: 2.8    Passive exposure: Current   Smokeless tobacco: Former  Building services engineer status: Never Used  Substance and Sexual Activity   Alcohol use: Yes    Comment: 3 beer today   Drug use:  No   Sexual activity: Not Currently  Other Topics Concern   Not on file  Social History Narrative   3 stepchildren   Social Drivers of Health   Financial Resource Strain: Low Risk  (01/13/2024)   Overall Financial Resource Strain (CARDIA)    Difficulty of Paying Living Expenses: Not hard at all  Food Insecurity: No Food Insecurity (01/13/2024)   Hunger Vital Sign    Worried About Running Out of Food in the Last Year: Never true    Ran Out of Food in the Last Year: Never true  Transportation Needs: No Transportation Needs (01/13/2024)   PRAPARE - Administrator, Civil Service (Medical): No    Lack of Transportation (Non-Medical): No  Physical Activity: Inactive (01/13/2024)   Exercise Vital Sign    Days of Exercise per Week: 0 days    Minutes of Exercise per Session: 0 min  Stress: No Stress Concern Present (01/13/2024)   [employer] of Occupational Health - Occupational Stress Questionnaire    Feeling of Stress: Not at all  Social Connections: Moderately Integrated (01/13/2024)   Social Connection and Isolation Panel    Frequency of Communication with Friends and Family: More than three times a week    Frequency of Social Gatherings with Friends and Family: Three times a week    Attends Religious Services: 1 to 4 times per year    Active Member of Clubs or Organizations: No    Attends Banker Meetings: Never    Marital Status: Married    Tobacco Counseling Ready to quit: Not Answered Counseling given: Not Answered    Clinical Intake:     Pain : No/denies pain     Diabetes: No  Lab Results  Component Value Date   HGBA1C 5.3 07/15/2022   HGBA1C 5.9 (H) 07/21/2021   HGBA1C 4.9 03/11/2021     How often do you need to have someone help you when you read instructions, pamphlets, or other written materials from your doctor or pharmacy?: 1 - Never  Interpreter Needed?: No  Information entered by :: Charmaine Bloodgood LPN   Activities of Daily  Living     01/13/2024    9:22 AM  In your present state of health, do you have any difficulty performing the following activities:  Hearing? 0  Vision? 0  Difficulty concentrating or making decisions? 0  Walking or climbing stairs? 0  Dressing or bathing? 0  Doing errands, shopping? 0  Preparing Food and eating ? N  Using the Toilet? N  In the past six months, have you accidently leaked urine? N  Do you have problems with loss of bowel control? N  Managing your Medications? N  Managing your Finances? N  Housekeeping or managing your Housekeeping? N    Patient Care Team: Alphonsa Glendia LABOR, MD as PCP - General (Family Medicine) Alvan Dorn FALCON, MD as PCP - Cardiology (Cardiology) Melba Alm CROME, MD (Psychiatry) Corina Norleen SAUNDERS, PsyD (Psychology) Shaaron Lamar HERO, MD (Gastroenterology) Charlott Elsie PARAS, OD as Physician Assistant Jesus Oliphant, MD as Attending Physician Clance, Francis HERO, MD (Pulmonary Disease) Neysa Reggy BIRCH, MD as Attending Physician (Pulmonary Disease)  I have updated your Care Teams any recent  Medical Services you may have received from other providers in the past year.     Assessment:   This is a routine wellness examination for Isiah.  Hearing/Vision screen Hearing Screening - Comments:: Denies hearing difficulties   Vision Screening - Comments:: No vision problems; will schedule routine eye exam soon     Goals Addressed             This Visit's Progress    Maintain health and independence   On track      Depression Screen     01/13/2024    9:47 AM 12/23/2023    1:20 PM 06/23/2023   11:10 AM 04/14/2023    9:29 AM 03/18/2023    1:52 PM 11/22/2022    3:26 PM 08/12/2022    8:57 AM  PHQ 2/9 Scores  PHQ - 2 Score 2 2 4  2 2    PHQ- 9 Score 11 11 13  9 8       Information is confidential and restricted. Go to Review Flowsheets to unlock data.    Fall Risk     01/13/2024    9:53 AM 12/23/2023    1:20 PM 03/18/2023    1:51 PM 11/22/2022     3:26 PM 06/30/2022    8:20 AM  Fall Risk   Falls in the past year? 1 1 1 1 1   Number falls in past yr: 1 1 0 1 1  Injury with Fall? 0 0 0 0 0  Risk for fall due to : History of fall(s);Impaired mobility      Follow up Education provided;Falls prevention discussed;Falls evaluation completed        MEDICARE RISK AT HOME:  Medicare Risk at Home Any stairs in or around the home?: No If so, are there any without handrails?: No Home free of loose throw rugs in walkways, pet beds, electrical cords, etc?: Yes Adequate lighting in your home to reduce risk of falls?: Yes Life alert?: No Use of a cane, walker or w/c?: No Grab bars in the bathroom?: Yes Shower chair or bench in shower?: No Elevated toilet seat or a handicapped toilet?: No  TIMED UP AND GO:  Was the test performed?  No  Cognitive Function: Declined/Normal: No cognitive concerns noted by patient or family. Patient alert, oriented, able to answer questions appropriately and recall recent events. No signs of memory loss or confusion.        Immunizations Immunization History  Administered Date(s) Administered   Fluad Quad(high Dose 65+) 05/10/2022   Fluad Trivalent(High Dose 65+) 03/18/2023   Influenza Inj Mdck Quad Pf 06/21/2019   Influenza,inj,Quad PF,6+ Mos 04/10/2013, 03/18/2014, 04/23/2015, 04/21/2016, 05/19/2017, 03/30/2018, 04/17/2020, 07/07/2021   Influenza-Unspecified 05/14/2011, 06/21/2019   Moderna Sars-Covid-2 Vaccination 07/17/2019, 08/13/2019   PNEUMOCOCCAL CONJUGATE-20 10/20/2021   Pneumococcal Polysaccharide-23 11/15/2017   Pneumococcal-Unspecified 04/10/1997   Td 10/23/2013   Tdap 01/30/2022    Screening Tests Health Maintenance  Topic Date Due   Zoster Vaccines- Shingrix (1 of 2) Never done   COVID-19 Vaccine (3 - Moderna risk series) 09/10/2019   INFLUENZA VACCINE  01/13/2024   Lung Cancer Screening  05/23/2024   Medicare Annual Wellness (AWV)  01/12/2025   DTaP/Tdap/Td (3 - Td or Tdap)  01/31/2032   Colonoscopy  06/05/2033   Pneumococcal Vaccine: 50+ Years  Completed   Hepatitis C Screening  Completed   Hepatitis B Vaccines  Aged Out   HPV VACCINES  Aged Out   Meningococcal B Vaccine  Aged Out  Health Maintenance  Health Maintenance Due  Topic Date Due   Zoster Vaccines- Shingrix (1 of 2) Never done   COVID-19 Vaccine (3 - Moderna risk series) 09/10/2019   INFLUENZA VACCINE  01/13/2024   Health Maintenance Items Addressed: Information provided on Shingrix   Additional Screening:  Vision Screening: Recommended annual ophthalmology exams for early detection of glaucoma and other disorders of the eye. Would you like a referral to an eye doctor? No    Dental Screening: Recommended annual dental exams for proper oral hygiene  Community Resource Referral / Chronic Care Management: CRR required this visit?  No   CCM required this visit?  No   Plan:    I have personally reviewed and noted the following in the patient's chart:   Medical and social history Use of alcohol, tobacco or illicit drugs  Current medications and supplements including opioid prescriptions. Patient is currently taking opioid prescriptions. Information provided to patient regarding non-opioid alternatives. Patient advised to discuss non-opioid treatment plan with their provider. Functional ability and status Nutritional status Physical activity Advanced directives List of other physicians Hospitalizations, surgeries, and ER visits in previous 12 months Vitals Screenings to include cognitive, depression, and falls Referrals and appointments  In addition, I have reviewed and discussed with patient certain preventive protocols, quality metrics, and best practice recommendations. A written personalized care plan for preventive services as well as general preventive health recommendations were provided to patient.   Lavelle Pfeiffer [place], [place]   06/21/7972   After Visit Summary:  (Pick Up) Due to this being a telephonic visit, with patients personalized plan was offered to patient and patient has requested to Pick up at office.  Notes: Nothing significant to report at this time.

## 2024-01-31 ENCOUNTER — Ambulatory Visit (INDEPENDENT_AMBULATORY_CARE_PROVIDER_SITE_OTHER): Admitting: Gastroenterology

## 2024-01-31 ENCOUNTER — Encounter: Payer: Self-pay | Admitting: Internal Medicine

## 2024-01-31 ENCOUNTER — Ambulatory Visit: Admitting: Internal Medicine

## 2024-01-31 VITALS — BP 153/104 | HR 72 | Temp 97.9°F | Ht 70.0 in | Wt 153.4 lb

## 2024-01-31 DIAGNOSIS — K642 Third degree hemorrhoids: Secondary | ICD-10-CM

## 2024-01-31 DIAGNOSIS — R634 Abnormal weight loss: Secondary | ICD-10-CM | POA: Diagnosis not present

## 2024-01-31 DIAGNOSIS — F5000 Anorexia nervosa, unspecified: Secondary | ICD-10-CM

## 2024-01-31 DIAGNOSIS — K625 Hemorrhage of anus and rectum: Secondary | ICD-10-CM

## 2024-01-31 DIAGNOSIS — R630 Anorexia: Secondary | ICD-10-CM | POA: Diagnosis not present

## 2024-01-31 NOTE — Patient Instructions (Signed)
 It was good to see you again today  As discussed, we will schedule you back in the office for hemorrhoid banding in the near future.  Pamphlet on hemorrhoid banding provided  We will look into your lack of appetite and weight loss further when you return.

## 2024-01-31 NOTE — Progress Notes (Unsigned)
 Primary Care Physician:  Alphonsa Glendia LABOR, MD Primary Gastroenterologist:  Dr. Shaaron  Pre-Procedure History & Physical: HPI:  [name] is a 68 y.o. male here for upper rectal bleeding.  Has had intermittent episodes of rectal bleeding for over 10 years.  We saw him years ago for symptomatic hemorrhoids he did well with hemorrhoid banding.  More recently has developed recurrent painless bleeding.  Colonoscopy last year by Dr. Cinderella demonstrated significant hemorrhoids but no other findings.  He denies constipation anything he has occasional loose stools.  Denies straining.  On chronic opioid therapy.  He has no anorectal pain.  It is always fresh appearing blood.  No meaningful improvement with the topical agents.  His hemoglobin is very much remain in the 15-16 range even recently.  He has lost about 20 pounds in the past 2-1/2 years he state he lost some teeth during his CABG procedure which led to getting all of his teeth removed.  He does not have much of an appetite these days.  He denies postprandial abdominal pain.  GERD is well-controlled.  He does not have any dysphagia after his Schatzki's ring was dilated in the past. He is on no anticoagulation or antiplatelet therapy.  Past Medical History:  Diagnosis Date   Anxiety    Anxiety and depression    ASCVD (arteriosclerotic cardiovascular disease)    a. s/p PTCA to LCx in 1993 b. BMS to RCA in 01/2012 with residual 80% OM2 stenosis and medical management recommended c. 08/2019: cath showing patent stents with occluded OM2 --> medical management. d. s/p CABG in 02/2021 with LIMA-LAD, reverse SVG-D1 and SVG-OM   Asthma    CAD (coronary artery disease)    Carcinoma in situ of colon 2004   rectal polyp   Colitis, ischemic (HCC) 2011   COPD (chronic obstructive pulmonary disease) (HCC)    mild ;excercise induced hypoxemia by cp stress test ;asthma ,bronchitis,   Depression    Diverticulosis    Gastritis 12/30/2010   EGD Dr Shaaron    GERD (gastroesophageal reflux disease)    GI bleed    H. 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)    Leukocytosis    Dr Lake   Lung nodule 07/16/2011   Myocardial infarction [facility])    age 38   Restless leg syndrome    Schatzki's ring    Sleep apnea    does not use CPAP:cannot tolerate, PCP aware   Syncope    Tobacco abuse    50 pack years continuing at one halp pack daily   Tubular adenoma of colon 06/2009   Colonosocpy Dr Shaaron    Past Surgical History:  Procedure Laterality Date   BIOPSY  12/19/2017   Procedure: BIOPSY;  Surgeon: Shaaron Lamar HERO, MD;  [facility]: [facility];  Service: Endoscopy;;  gastric   BIOPSY  11/05/2021   Procedure: BIOPSY;  Surgeon: Shaaron Lamar HERO, MD;  [facility]: [facility];  Service: Endoscopy;;   CARDIAC CATHETERIZATION     CHOLECYSTECTOMY  2004   COLONOSCOPY  06/2009   normal terminal ileum, segmental mild inflammation of sigmoid colon (bx unremarkable), polyp, tubular adenoma   COLONOSCOPY N/A 07/31/2013   Dr.Arien Morine- redundant anal canal hemorrhoids, colonic diverticulosis, tubular adenoma   COLONOSCOPY W/ POLYPECTOMY  2004   rectal polyp with carcinoma in  situ removed via colonoscopy   COLONOSCOPY WITH PROPOFOL  N/A 09/15/2015   Dr. Shaaron: Scattered diverticula throughout the colon, 2 sessile polyps found in the descending colon and cecum, 5 mm in size.  Cecal polyp was sessile serrated polyp, descending colon polyp was a tubular adenoma.  He had a abnormal perianal exam along with grade 3 hemorrhoids.  Surveillance exam recommended for 5-year follow-up.   COLONOSCOPY WITH PROPOFOL  N/A 12/19/2017   one 5 mm polyp in ascending colon and 1 cm sessile polyp in ascending s/p removal. Tubular adenoma. internal hemorrhoids   COLONOSCOPY WITH PROPOFOL  N/A 11/05/2021   Procedure: COLONOSCOPY WITH PROPOFOL ;  Surgeon: Shaaron Lamar HERO,  MD;  [facility]: [facility];  Service: Endoscopy;  Laterality: N/A;  12:30pm   CORONARY ANGIOPLASTY WITH STENT PLACEMENT  01/26/2012   1; total is now 2   CORONARY ARTERY BYPASS GRAFT N/A 03/13/2021   Procedure: CORONARY ARTERY BYPASS GRAFTING (CABG), ON PUMP, TIMES THREE, USING LEFT INTERNAL MAMMARY ARTERY AND RIGHT ENDOSCOPICALLY HARVESTED GREATER SAPHENOUS VEIN;  Surgeon: Shyrl Linnie KIDD, MD;  Location: [facility];  Service: Open Heart Surgery;  Laterality: N/A;   CORONARY/GRAFT ACUTE MI REVASCULARIZATION N/A 03/10/2021   Procedure: Coronary/Graft Acute MI Revascularization;  Surgeon: Claudene Victory ORN, MD;  Location: [facility] INVASIVE CV LAB;  Service: Cardiovascular;  Laterality: N/A;   ENDOVEIN HARVEST OF GREATER SAPHENOUS VEIN Right 03/13/2021   Procedure: ENDOVEIN HARVEST OF GREATER SAPHENOUS VEIN;  Surgeon: Shyrl Linnie KIDD, MD;  Location: [facility];  Service: Open Heart Surgery;  Laterality: Right;   ESOPHAGOGASTRODUODENOSCOPY  02/2009   query Barrett's but bx negative   ESOPHAGOGASTRODUODENOSCOPY  12/30/2010   Lamar HERO Kieana Livesay,gastritis, dilated 30F, sm HH, 1 small ulcer, Duodenal erosions, benign bx   ESOPHAGOGASTRODUODENOSCOPY (EGD) WITH ESOPHAGEAL DILATION N/A 09/12/2012   MFM:Wnwrmpuprjo Schatzki's ring s/p Maloney dilator. Small hiatal hernia. negative path   ESOPHAGOGASTRODUODENOSCOPY (EGD) WITH ESOPHAGEAL DILATION N/A 07/31/2013   Dr. Shaaron- normal egd, s/p [facility] dilation empirically. Normal small bowel biopsies    ESOPHAGOGASTRODUODENOSCOPY (EGD) WITH PROPOFOL  N/A 09/15/2015   Dr. Shaaron: Medium sized hiatal hernia, normal-appearing esophagus status post empiric dilation   ESOPHAGOGASTRODUODENOSCOPY (EGD) WITH PROPOFOL  N/A 12/19/2017   erosive esophagitis s/p dilation, erosive gastropathy, normal duodenum   ESOPHAGOGASTRODUODENOSCOPY (EGD) WITH PROPOFOL  N/A 09/17/2019   Non-obstructing Schatzki's ring s/p dilation, medium-sized hiatal hernia, otherwise normal.   ESOPHAGOGASTRODUODENOSCOPY (EGD)  WITH PROPOFOL  N/A 11/05/2021   Procedure: ESOPHAGOGASTRODUODENOSCOPY (EGD) WITH PROPOFOL ;  Surgeon: Shaaron Lamar HERO, MD;  [facility];  Service: Endoscopy;  Laterality: N/A;   FLEXIBLE SIGMOIDOSCOPY  12/30/2010    Lamar HERO Devean Skoczylas,; internal hemorrhoids, anal papilla   HAND SURGERY     surgical intervention for injury of the fingers of the left hand many years ago   heart stent     HEMORRHOID BANDING     Dr. Shaaron   LEFT HEART CATH AND CORONARY ANGIOGRAPHY N/A 08/29/2019   Procedure: LEFT HEART CATH AND CORONARY ANGIOGRAPHY;  Surgeon: Anner Alm ORN, MD;  Location: [facility] INVASIVE CV LAB;  Service: Cardiovascular;  Laterality: N/A;   LEFT HEART CATH AND CORONARY ANGIOGRAPHY N/A 03/10/2021   Procedure: LEFT HEART CATH AND CORONARY ANGIOGRAPHY;  Surgeon: Claudene Victory ORN, MD;  Location: [facility];  Service: Cardiovascular;  Laterality: N/A;   LEFT HEART CATHETERIZATION WITH CORONARY ANGIOGRAM N/A 01/26/2012   Procedure: LEFT HEART CATHETERIZATION WITH CORONARY ANGIOGRAM;  Surgeon: Lynwood Schilling, MD;  Location[facility] CATH LAB;  Service: Cardiovascular;  Laterality: N/A;  MALONEY DILATION N/A 09/15/2015   Procedure: AGAPITO DILATION;  Surgeon: Lamar CHRISTELLA Hollingshead, MD;  [facility]: [facility];  Service: Endoscopy;  Laterality: N/A;   MALONEY DILATION N/A 12/19/2017   Procedure: AGAPITO DILATION;  Surgeon: Hollingshead Lamar CHRISTELLA, MD;  [facility]: [facility];  Service: Endoscopy;  Laterality: N/A;   MALONEY DILATION N/A 09/17/2019   Procedure: AGAPITO DILATION;  Surgeon: Hollingshead Lamar CHRISTELLA, MD;  [facility]: [facility];  Service: Endoscopy;  Laterality: N/A;   MALONEY DILATION N/A 11/05/2021   Procedure: AGAPITO DILATION;  Surgeon: Hollingshead Lamar CHRISTELLA, MD;  [facility];  Service: Endoscopy;  Laterality: N/A;   NASAL SEPTOPLASTY W/ TURBINOPLASTY  10/06/2011   Procedure: NASAL SEPTOPLASTY WITH TURBINATE REDUCTION;  Surgeon: Ida Loader, MD;  Location: [facility];  Service: ENT;  Laterality: Bilateral;    PERCUTANEOUS CORONARY STENT INTERVENTION (PCI-S) N/A 01/26/2012   Procedure: PERCUTANEOUS CORONARY STENT INTERVENTION (PCI-S);  Surgeon: Lynwood Schilling, MD;  Location: [facility] CATH LAB;  Service: Cardiovascular;  Laterality: N/A;   POLYPECTOMY  09/15/2015   Procedure: POLYPECTOMY;  Surgeon: Lamar CHRISTELLA Hollingshead, MD;  [facility];  Service: Endoscopy;;  Cecal polyp removed via cold snare/ Descending colon polyp removed via cold snare   POLYPECTOMY  12/19/2017   Procedure: POLYPECTOMY;  Surgeon: Hollingshead Lamar CHRISTELLA, MD;  [facility];  Service: Endoscopy;;  colon   TEE WITHOUT CARDIOVERSION N/A 03/13/2021   Procedure: TRANSESOPHAGEAL ECHOCARDIOGRAM (TEE);  Surgeon: Shyrl Linnie KIDD, MD;  [facility];  Service: Open Heart Surgery;  Laterality: N/A;    Prior to Admission medications   Medication Sig Start Date End Date Taking? Authorizing Provider  albuterol  (VENTOLIN  HFA) 108 (90 Base) MCG/ACT inhaler Inhale 2 puffs into the lungs every 4 (four) hours as needed for wheezing or shortness of breath. 10/30/19  Yes Darlean Ozell NOVAK, MD  ALPRAZolam  (XANAX ) 1 MG tablet TAKE ONE TABLET BY MOUTH THREE TIMES DAILY 12/11/23  Yes Okey Barnie SAUNDERS, MD  amLODipine  (NORVASC ) 5 MG tablet TAKE 1 TABLET EVERY DAY 12/23/23  Yes Alphonsa Glendia LABOR, MD  aspirin  EC 81 MG tablet Take 81 mg by mouth daily.   Yes [provider]  atorvastatin  (LIPITOR ) 80 MG tablet Take 1 tablet (80 mg total) by mouth daily. 10/05/23  Yes Luking, Glendia LABOR, MD  Cyanocobalamin  (VITAMIN B-12 PO) Take 1 tablet by mouth in the morning.   Yes [provider]  ezetimibe  (ZETIA ) 10 MG tablet Take 1 tablet (10 mg total) by mouth daily. 09/22/23  Yes Alphonsa Glendia LABOR, MD  FLUoxetine  (PROZAC ) 40 MG capsule TAKE 1 CAPSULE TWICE DAILY 12/28/23  Yes Okey Barnie SAUNDERS, MD  gabapentin  (NEURONTIN ) 300 MG capsule Take two tablets three times a day 04/14/23  Yes Okey Barnie SAUNDERS, MD  HYDROcodone -acetaminophen  (NORCO/VICODIN) 5-325 MG tablet 1/2  to 1 tablet taken twice daily as needed for pain caution drowsiness prescription to last 30 days 12/23/23  Yes Luking, Glendia LABOR, MD  HYDROcodone -acetaminophen  (NORCO/VICODIN) 5-325 MG tablet TAKE ONE TABLET BY MOUTH TWICE DAILY AS NEEDED FOR PAIN (TO LAST 30 DAYS) 12/23/23  Yes Alphonsa Glendia LABOR, MD  HYDROcodone -acetaminophen  (NORCO/VICODIN) 5-325 MG tablet 1/2-1 twice daily as needed pain prescription to last 30 days 12/23/23  Yes Luking, Glendia LABOR, MD  meclizine  (ANTIVERT ) 25 MG tablet Take 1 tablet (25 mg total) by mouth 3 (three) times daily as needed for dizziness. 12/23/23  Yes Luking, Glendia LABOR, MD  meloxicam  (MOBIC ) 15 MG tablet TAKE 1 TABLET  EVERY DAY 09/13/23  Yes Luking, Glendia LABOR, MD  nitroGLYCERIN  (NITROSTAT ) 0.4 MG SL tablet Place 1 tablet (0.4 mg total) under the tongue every 5 (five) minutes as needed for chest pain. 10/06/20  Yes Alphonsa Glendia LABOR, MD  OLANZapine  (ZYPREXA ) 10 MG tablet TAKE 1 TABLET AT BEDTIME 11/25/23  Yes Okey Barnie SAUNDERS, MD  rOPINIRole  (REQUIP ) 2 MG tablet TAKE 1 TABLET AT BEDTIME 12/23/23  Yes Luking, Glendia LABOR, MD  tamsulosin  (FLOMAX ) 0.4 MG CAPS capsule Take 1 capsule (0.4 mg total) by mouth daily. 12/23/23  Yes Alphonsa Glendia LABOR, MD  traZODone  (DESYREL ) 50 MG tablet TAKE 1 TABLET AT BEDTIME 08/03/23  Yes Okey Barnie SAUNDERS, MD  valsartan  (DIOVAN ) 40 MG tablet Take 1 tablet (40 mg total) by mouth daily. 12/23/23  Yes Alphonsa Glendia LABOR, MD    Allergies as of 01/31/2024   (No Known Allergies)    Family History  Problem Relation Age of Onset   Lung disease Father        deceased, black lung   Heart disease Mother        blood clots   Depression Mother    Cancer Paternal Uncle        unknown type   Cancer Maternal Aunt        unknown type   Kidney failure Maternal Uncle    Hypertension Brother    Colon cancer Neg Hx    ADD / ADHD Neg Hx    Alcohol abuse Neg Hx    Drug abuse Neg Hx    Anxiety disorder Neg Hx    Bipolar disorder Neg Hx    Dementia Neg Hx    OCD Neg Hx     Paranoid behavior Neg Hx    Schizophrenia Neg Hx    Physical abuse Neg Hx    Sexual abuse Neg Hx    Seizures Neg Hx     Social History   Socioeconomic History   Marital status: Married    Spouse name: Not on file   Number of children: 3   Years of education: Not on file   Highest education level: Not on file  Occupational History   Occupation: disable    Employer: RETIRED    Comment: [employer]  Tobacco Use   Smoking status: Every Day    Current packs/day: 0.00    Average packs/day: 1 pack/day for 51.6 years (51.6 ttl pk-yrs)    Types: Cigarettes    Start date: 07/31/1969    Last attempt to quit: 03/16/2021    Years since quitting: 2.8    Passive exposure: Current   Smokeless tobacco: Former  Building services engineer status: Never Used  Substance and Sexual Activity   Alcohol use: Yes    Comment: 3 beer today   Drug use: No   Sexual activity: Not Currently  Other Topics Concern   Not on file  Social History Narrative   3 stepchildren   Social Drivers of Health   Financial Resource Strain: Low Risk  (01/13/2024)   Overall Financial Resource Strain (CARDIA)    Difficulty of Paying Living Expenses: Not hard at all  Food Insecurity: No Food Insecurity (01/13/2024)   Hunger Vital Sign    Worried About Running Out of Food in the Last Year: Never true    Ran Out of Food in the Last Year: Never true  Transportation Needs: No Transportation Needs (01/13/2024)   PRAPARE - Administrator, Civil Service (Medical): No  Lack of Transportation (Non-Medical): No  Physical Activity: Inactive (01/13/2024)   Exercise Vital Sign    Days of Exercise per Week: 0 days    Minutes of Exercise per Session: 0 min  Stress: No Stress Concern Present (01/13/2024)   [employer] of Occupational Health - Occupational Stress Questionnaire    Feeling of Stress: Not at all  Social Connections: Moderately Integrated (01/13/2024)   Social Connection and Isolation Panel    Frequency of Communication  with Friends and Family: More than three times a week    Frequency of Social Gatherings with Friends and Family: Three times a week    Attends Religious Services: 1 to 4 times per year    Active Member of Clubs or Organizations: No    Attends Banker Meetings: Never    Marital Status: Married  Catering manager Violence: Not At Risk (01/13/2024)   Humiliation, Afraid, Rape, and Kick questionnaire    Fear of Current or Ex-Partner: No    Emotionally Abused: No    Physically Abused: No    Sexually Abused: No    Review of Systems: See HPI, otherwise negative ROS  Physical Exam: BP (!) 153/104 (BP Location: Left Arm, Patient Position: Sitting, Cuff Size: Normal)   Pulse 72   Temp 97.9 F (36.6 C) (Oral)   Ht 5' 10 (1.778 m)   Wt 153 lb 6.4 oz (69.6 kg)   SpO2 98%   BMI 22.01 kg/m  General:   Alert,  Well-developed, well-nourished, pleasant and cooperative in NAD Heart:  Regular rate and rhythm; no murmurs, clicks, rubs,  or gallops. Abdomen: Non-distended, normal bowel sounds.  Soft and nontender without appreciable mass or hepatosplenomegaly.  Rectal: Bloodstained perianal skin.  He has 2 small grade 3 hemorrhoid tags.  They reduce; digital exam is nontender he does not have any masses in the rectum appreciable.  Scant brown stool Hemoccult positive.  Impression/Plan: 68 year old gentleman presents with symptomatic bleeding grade 3 hemorrhoids.  Findings of recent colonoscopy are reassuring.  This has been a chronic recurrent issue over the years.  Pursuing further topical agents is not likely going to be helpful.  Symptoms of bleeding remitted for several years after hemorrhoid banding approximately 10 years ago.  The best course of action here is a repeat course of hemorrhoid banding.  I have discussed the risk and benefits of this procedure with the patient once again and provided him a pamphlet regarding hemorrhoid banding.  Anorexia and weight loss nonspecific and  likely multifactorial in etiology.  It is somewhat concerning he takes Mobic  without concomitant acid suppression therapy.  Recommendations:  Will get him back into the office in the near future for repeat banding.  Delve into weight loss/anorexia further when he returns.  Further recommendations to follow.       Notice: This dictation was prepared with Dragon dictation along with smaller phrase technology. Any transcriptional errors that result from this process are unintentional and may not be corrected upon review.

## 2024-02-01 ENCOUNTER — Encounter (INDEPENDENT_AMBULATORY_CARE_PROVIDER_SITE_OTHER): Admitting: Gastroenterology

## 2024-02-21 ENCOUNTER — Ambulatory Visit: Attending: Cardiology | Admitting: Cardiology

## 2024-02-21 ENCOUNTER — Encounter: Payer: Self-pay | Admitting: Cardiology

## 2024-02-21 VITALS — BP 106/72 | HR 78 | Ht 70.0 in | Wt 154.0 lb

## 2024-02-21 DIAGNOSIS — I25118 Atherosclerotic heart disease of native coronary artery with other forms of angina pectoris: Secondary | ICD-10-CM | POA: Diagnosis not present

## 2024-02-21 DIAGNOSIS — I6529 Occlusion and stenosis of unspecified carotid artery: Secondary | ICD-10-CM | POA: Diagnosis not present

## 2024-02-21 DIAGNOSIS — E782 Mixed hyperlipidemia: Secondary | ICD-10-CM

## 2024-02-21 DIAGNOSIS — I10 Essential (primary) hypertension: Secondary | ICD-10-CM | POA: Diagnosis not present

## 2024-02-21 NOTE — Progress Notes (Signed)
 Clinical Summary [name] is a 68 y.o.male seen today for follow up of the following medical problems.    1. CAD - hx of infeior MI with prior PTCA to LCX in 1993 -  cath 2011 LM patent, LAD prox 40%, LCX prox 40%, OM1 30%, OM2 80% small too small for intervention, RCA very small non dom, 90%. LVEF 50-55% by LV gram.   08/2011 echo LVEF 55%, grade I diastolic dysfunction.   03/2016 echo: LVEF 50%, normal diastolic function     08/2017 nuclear stress: inferior /inferolaterla infarct without ischemia. LVEF 45%   02/2021 CABG LIMA-LAD, reverse SVG-D1 and SVG-OM, presented initially with ACS 02/2021 echo: LVEF 45-50%, mild to mod MR    - no chest pains, no SOB/DOE - compliant with meds       2. Hyperlipidemia 07/2021 TC 158 TG 99 HDL 44 LDL 96 - 07/2022 TC 868 TG 862 HDL 32 LDL 75 - 09/2023 TC 860 TG 883 HDL 38 LDL 80. Based on this lab pcp added zetia  10mg  to his atorvastatin  - compliant with meds   3. HTN - compliant with meds   4. COPD - followed by pcp     5. OSA - does not use CPAP machine due to discomfort     6. GERD/Dysphagia - followed by GI     7. Carotid stenosis -02/2021 carotid US  bilateral 1-39% disease       AAA screen 03/2022 normal   SH: enjoys working in yard, fishing.  Daughter fairly recently committed suicide. Raising her kids children boy 8 and girl 71.  Past Medical History:  Diagnosis Date   Anxiety    Anxiety and depression    ASCVD (arteriosclerotic cardiovascular disease)    a. s/p PTCA to LCx in 1993 b. BMS to RCA in 01/2012 with residual 80% OM2 stenosis and medical management recommended c. 08/2019: cath showing patent stents with occluded OM2 --> medical management. d. s/p CABG in 02/2021 with LIMA-LAD, reverse SVG-D1 and SVG-OM   Asthma    CAD (coronary artery disease)    Carcinoma in situ of colon 2004   rectal polyp   Colitis, ischemic (HCC) 2011   COPD (chronic obstructive pulmonary disease) (HCC)    mild ;excercise  induced hypoxemia by cp stress test ;asthma ,bronchitis,   Depression    Diverticulosis    Gastritis 12/30/2010   EGD [name]   GERD (gastroesophageal reflux disease)    GI bleed    H. 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)    Leukocytosis    [name]   Lung nodule 07/16/2011   Myocardial infarction [name]    age 65   Restless leg syndrome    Schatzki's ring    Sleep apnea    does not use CPAP:cannot tolerate, PCP aware   Syncope    Tobacco abuse    50 pack years continuing at one halp pack daily   Tubular adenoma of colon 06/2009   Colonosocpy [name]     No Known Allergies   Current Outpatient Medications  Medication Sig Dispense Refill   albuterol  (VENTOLIN  HFA) 108 (90 Base) MCG/ACT inhaler Inhale 2 puffs into the lungs every 4 (four) hours as needed for wheezing or shortness of breath. 18 g 1   ALPRAZolam  (XANAX ) 1 MG tablet  TAKE ONE TABLET BY MOUTH THREE TIMES DAILY 90 tablet 3   amLODipine  (NORVASC ) 5 MG tablet TAKE 1 TABLET EVERY DAY 90 tablet 3   aspirin  EC 81 MG tablet Take 81 mg by mouth daily.     atorvastatin  (LIPITOR ) 80 MG tablet Take 1 tablet (80 mg total) by mouth daily. 90 tablet 3   Cyanocobalamin  (VITAMIN B-12 PO) Take 1 tablet by mouth in the morning.     ezetimibe  (ZETIA ) 10 MG tablet Take 1 tablet (10 mg total) by mouth daily. 90 tablet 3   FLUoxetine  (PROZAC ) 40 MG capsule TAKE 1 CAPSULE TWICE DAILY 180 capsule 3   gabapentin  (NEURONTIN ) 300 MG capsule Take two tablets three times a day 540 capsule 3   HYDROcodone -acetaminophen  (NORCO/VICODIN) 5-325 MG tablet 1/2 to 1 tablet taken twice daily as needed for pain caution drowsiness prescription to last 30 days 45 tablet 0   HYDROcodone -acetaminophen  (NORCO/VICODIN) 5-325 MG tablet TAKE ONE TABLET BY MOUTH TWICE DAILY AS NEEDED FOR PAIN (TO LAST 30 DAYS) 45 tablet 0    HYDROcodone -acetaminophen  (NORCO/VICODIN) 5-325 MG tablet 1/2-1 twice daily as needed pain prescription to last 30 days 20 tablet 0   meclizine  (ANTIVERT ) 25 MG tablet Take 1 tablet (25 mg total) by mouth 3 (three) times daily as needed for dizziness. 30 tablet 3   meloxicam  (MOBIC ) 15 MG tablet TAKE 1 TABLET EVERY DAY 90 tablet 3   nitroGLYCERIN  (NITROSTAT ) 0.4 MG SL tablet Place 1 tablet (0.4 mg total) under the tongue every 5 (five) minutes as needed for chest pain. 20 tablet 5   OLANZapine  (ZYPREXA ) 10 MG tablet TAKE 1 TABLET AT BEDTIME 90 tablet 3   rOPINIRole  (REQUIP ) 2 MG tablet TAKE 1 TABLET AT BEDTIME 90 tablet 3   tamsulosin  (FLOMAX ) 0.4 MG CAPS capsule Take 1 capsule (0.4 mg total) by mouth daily. 90 capsule 3   traZODone  (DESYREL ) 50 MG tablet TAKE 1 TABLET AT BEDTIME 90 tablet 3   valsartan  (DIOVAN ) 40 MG tablet Take 1 tablet (40 mg total) by mouth daily. 90 tablet 1   No current facility-administered medications for this visit.     Past Surgical History:  Procedure Laterality Date   BIOPSY  12/19/2017   Procedure: BIOPSY;  Surgeon: [name] Lamar HERO, MD;  [facility];  Service: Endoscopy;;  gastric   BIOPSY  11/05/2021   Procedure: BIOPSY;  Surgeon: [name] Lamar HERO, MD;  [facility];  Service: Endoscopy;;   CARDIAC CATHETERIZATION     CHOLECYSTECTOMY  2004   COLONOSCOPY  06/2009   normal terminal ileum, segmental mild inflammation of sigmoid colon (bx unremarkable), polyp, tubular adenoma   COLONOSCOPY N/A 07/31/2013   [name]- redundant anal canal hemorrhoids, colonic diverticulosis, tubular adenoma   COLONOSCOPY W/ POLYPECTOMY  2004   rectal polyp with carcinoma in situ removed via colonoscopy   COLONOSCOPY WITH PROPOFOL  N/A 09/15/2015   [name]. Shaaron: Scattered diverticula throughout the colon, 2 sessile polyps found in the descending colon and cecum, 5 mm in size.  Cecal polyp was sessile serrated polyp, descending colon polyp was a tubular adenoma.  He had  a abnormal perianal exam along with grade 3 hemorrhoids.  Surveillance exam recommended for 5-year follow-up.   COLONOSCOPY WITH PROPOFOL  N/A 12/19/2017   one 5 mm polyp in ascending colon and 1 cm sessile polyp in ascending s/p removal. Tubular adenoma. internal hemorrhoids   COLONOSCOPY WITH PROPOFOL  N/A 11/05/2021   Procedure: COLONOSCOPY WITH PROPOFOL ;  Surgeon: [name] Lamar  M, MD;  [facility];  Service: Endoscopy;  Laterality: N/A;  12:30pm   CORONARY ANGIOPLASTY WITH STENT PLACEMENT  01/26/2012   1; total is now 2   CORONARY ARTERY BYPASS GRAFT N/A 03/13/2021   Procedure: CORONARY ARTERY BYPASS GRAFTING (CABG), ON PUMP, TIMES THREE, USING LEFT INTERNAL MAMMARY ARTERY AND RIGHT ENDOSCOPICALLY HARVESTED GREATER SAPHENOUS VEIN;  Surgeon: Shyrl Linnie KIDD, MD;  Location: [facility] OR;  Service: Open Heart Surgery;  Laterality: N/A;   CORONARY/GRAFT ACUTE MI REVASCULARIZATION N/A 03/10/2021   Procedure: Coronary/Graft Acute MI Revascularization;  Surgeon: Claudene Victory ORN, MD;  Location: [name] INVASIVE CV LAB;  Service: Cardiovascular;  Laterality: N/A;   ENDOVEIN HARVEST OF GREATER SAPHENOUS VEIN Right 03/13/2021   Procedure: ENDOVEIN HARVEST OF GREATER SAPHENOUS VEIN;  Surgeon: Shyrl Linnie KIDD, MD;  Location: [facility] OR;  Service: Open Heart Surgery;  Laterality: Right;   ESOPHAGOGASTRODUODENOSCOPY  02/2009   query Barrett's but bx negative   ESOPHAGOGASTRODUODENOSCOPY  12/30/2010   Lamar [name] Rourk,gastritis, dilated 27F, sm HH, 1 small ulcer, Duodenal erosions, benign bx   ESOPHAGOGASTRODUODENOSCOPY (EGD) WITH ESOPHAGEAL DILATION N/A 09/12/2012   MFM:Wnwrmpuprjo Schatzki's ring s/p Maloney dilator. Small hiatal hernia. negative path   ESOPHAGOGASTRODUODENOSCOPY (EGD) WITH ESOPHAGEAL DILATION N/A 07/31/2013   [name]. Shaaron- normal egd, s/p [facility] dilation empirically. Normal small bowel biopsies    ESOPHAGOGASTRODUODENOSCOPY (EGD) WITH PROPOFOL  N/A 09/15/2015   [name]. Shaaron: Medium sized hiatal hernia,  normal-appearing esophagus status post empiric dilation   ESOPHAGOGASTRODUODENOSCOPY (EGD) WITH PROPOFOL  N/A 12/19/2017   erosive esophagitis s/p dilation, erosive gastropathy, normal duodenum   ESOPHAGOGASTRODUODENOSCOPY (EGD) WITH PROPOFOL  N/A 09/17/2019   Non-obstructing Schatzki's ring s/p dilation, medium-sized hiatal hernia, otherwise normal.   ESOPHAGOGASTRODUODENOSCOPY (EGD) WITH PROPOFOL  N/A 11/05/2021   Procedure: ESOPHAGOGASTRODUODENOSCOPY (EGD) WITH PROPOFOL ;  Surgeon: [name] Lamar HERO, MD;  [facility];  Service: Endoscopy;  Laterality: N/A;   FLEXIBLE SIGMOIDOSCOPY  12/30/2010    Lamar [name] Rourk,; internal hemorrhoids, anal papilla   HAND SURGERY     surgical intervention for injury of the fingers of the left hand many years ago   heart stent     HEMORRHOID BANDING     [name]. Shaaron   LEFT HEART CATH AND CORONARY ANGIOGRAPHY N/A 08/29/2019   Procedure: LEFT HEART CATH AND CORONARY ANGIOGRAPHY;  Surgeon: Anner Alm ORN, MD;  Location: [facility] INVASIVE CV LAB;  Service: Cardiovascular;  Laterality: N/A;   LEFT HEART CATH AND CORONARY ANGIOGRAPHY N/A 03/10/2021   Procedure: LEFT HEART CATH AND CORONARY ANGIOGRAPHY;  Surgeon: Claudene Victory ORN, MD;  Location: [facility] INVASIVE CV LAB;  Service: Cardiovascular;  Laterality: N/A;   LEFT HEART CATHETERIZATION WITH CORONARY ANGIOGRAM N/A 01/26/2012   Procedure: LEFT HEART CATHETERIZATION WITH CORONARY ANGIOGRAM;  Surgeon: Lynwood Schilling, MD;  Location: [facility] CATH LAB;  Service: Cardiovascular;  Laterality: N/A;   MALONEY DILATION N/A 09/15/2015   Procedure: AGAPITO DILATION;  Surgeon: Lamar [name] Shaaron, MD;  [facility];  Service: Endoscopy;  Laterality: N/A;   MALONEY DILATION N/A 12/19/2017   Procedure: AGAPITO DILATION;  Surgeon: [name] Lamar HERO, MD;  [facility];  Service: Endoscopy;  Laterality: N/A;   MALONEY DILATION N/A 09/17/2019   Procedure: AGAPITO DILATION;  Surgeon: [name] Lamar HERO, MD;  [facility];  Service:  Endoscopy;  Laterality: N/A;   MALONEY DILATION N/A 11/05/2021   Procedure: AGAPITO DILATION;  Surgeon: [name] Lamar HERO, MD;  [facility];  Service: Endoscopy;  Laterality: N/A;  NASAL SEPTOPLASTY W/ TURBINOPLASTY  10/06/2011   Procedure: NASAL SEPTOPLASTY WITH TURBINATE REDUCTION;  Surgeon: Ida Loader, MD;  Location: [facility] OR;  Service: ENT;  Laterality: Bilateral;   PERCUTANEOUS CORONARY STENT INTERVENTION (PCI-S) N/A 01/26/2012   Procedure: PERCUTANEOUS CORONARY STENT INTERVENTION (PCI-S);  Surgeon: Lynwood Schilling, MD;  Location: [facility] CATH LAB;  Service: Cardiovascular;  Laterality: N/A;   POLYPECTOMY  09/15/2015   Procedure: POLYPECTOMY;  Surgeon: Lamar CHRISTELLA Hollingshead, MD;  [facility];  Service: Endoscopy;;  Cecal polyp removed via cold snare/ Descending colon polyp removed via cold snare   POLYPECTOMY  12/19/2017   Procedure: POLYPECTOMY;  Surgeon: Hollingshead Lamar CHRISTELLA, MD;  [facility];  Service: Endoscopy;;  colon   TEE WITHOUT CARDIOVERSION N/A 03/13/2021   Procedure: TRANSESOPHAGEAL ECHOCARDIOGRAM (TEE);  Surgeon: Shyrl Linnie KIDD, MD;  Location: [facility] OR;  Service: Open Heart Surgery;  Laterality: N/A;     No Known Allergies    Family History  Problem Relation Age of Onset   Lung disease Father        deceased, black lung   Heart disease Mother        blood clots   Depression Mother    Cancer Paternal Uncle        unknown type   Cancer Maternal Aunt        unknown type   Kidney failure Maternal Uncle    Hypertension Brother    Colon cancer Neg Hx    ADD / ADHD Neg Hx    Alcohol abuse Neg Hx    Drug abuse Neg Hx    Anxiety disorder Neg Hx    Bipolar disorder Neg Hx    Dementia Neg Hx    OCD Neg Hx    Paranoid behavior Neg Hx    Schizophrenia Neg Hx    Physical abuse Neg Hx    Sexual abuse Neg Hx    Seizures Neg Hx      Social History [name] reports that he quit smoking about 2 years ago. His smoking use included cigarettes. He started  smoking about 54 years ago. He has a 51.6 pack-year smoking history. He has been exposed to tobacco smoke. He has quit using smokeless tobacco. Mr. Becvar reports current alcohol use.    Physical Examination Vitals:   02/21/24 1011  BP: 106/72  Pulse: 78  SpO2: 95%   Filed Weights   02/21/24 1011  Weight: 154 lb (69.9 kg)    Gen: resting comfortably, no acute distress HEENT: no scleral icterus, pupils equal round and reactive, no palptable cervical adenopathy,  CV: RRR, no m/rg, no jvd Resp: Clear to auscultation bilaterally GI: abdomen is soft, non-tender, non-distended, normal bowel sounds, no hepatosplenomegaly MSK: extremities are warm, no edema.  Skin: warm, no rash Neuro:  no focal deficits Psych: appropriate affect   Diagnostic Studies 02/2010 Cath DESCRIPTION OF PROCEDURE:  The risks and indication were explained. Consent was signed and placed on the chart.  A 4-French arterial sheath was placed in the right femoral artery using a modified Seldinger technique.  A standard catheters including a JL-4, 3DRC, and angled pigtail were used.  All catheter exchanges were made over wire.  There were no apparent complications.  Central aortic pressure 109/67 with a mean of 86.  LV pressure 110/70 with an EDP of 28.  There was no aortic stenosis.   Left main was normal.   LAD was a long vessel coursing to the apex, it gave  off a moderate-sized diagonal Maylon Sailors.  There was a 40% lesion in the proximal LAD, otherwise minimal luminal irregularities.   Left circumflex was a large dominant vessel, gave off a narrow caliber ramus, large OM-1, and narrow caliber OM-2 in several posterolaterals. In the proximal left circumflex, he had a 40% lesion which extended into the proximal portion of the OM-1.  There was a 30% lesion in the mid OM- 1.  In the OM-2, there was a long 80% stenosis throughout the ostial and proximal portion.  This vessel was very small caliber vessel.    Right coronary artery was a nondominant vessel, gave off an RV Brendolyn Stockley. There was approximately 90% stenosis in the proximal portion.  Once again, this was a very small nondominant vessel.   Left ventriculogram done in the RAO position showed an EF of 50-55% with inferior basilar akinesis.  There was no significant mitral regurgitation.   Abdominal aortogram showed 40% ostial lesion in the right renal artery. The left renal artery was widely patent.  There was no abdominal aortic aneurysm.  On panning down over the iliac and femoral system, there was no high-grade lesions.   ASSESSMENT: 1. Stable coronary artery disease with high-grade lesions in the small     obtuse marginal 2 and right coronary artery both of which are too     small for percutaneous intervention. 2. Left ventricular ejection fraction was 50-55% with mildly elevated     filling pressures. 3. Very mild right renal artery stenosis. 4. No obvious high-grade proximal peripheral arterial disease.   PLAN/DISCUSSION:  I suspect most of his dyspnea is likely due to his lung disease.  I have urged him to stop smoking.  We will continue medical therapy now for his heart disease.     08/2011 Echo Study Conclusions  - Left ventricle: The cavity size was normal. There was mild focal basal hypertrophy of the septum. The estimated ejection fraction was 55%. There is akinesis of the basalinferior myocardium. Doppler parameters are consistent with abnormal left ventricular relaxation (grade 1 diastolic dysfunction). - Mitral valve: Trivial regurgitation. - Left atrium: The atrium was at the upper limits of normal in size. - Tricuspid valve: Trivial regurgitation. - Pulmonary arteries: PA peak pressure: 32mm Hg (S). - Pericardium, extracardiac: There was no pericardial effusion.   09/2015 ABIs: 1.2 bilaterally     03/2016 echo Study Conclusions   - Left ventricle: The cavity size was normal. Wall thickness was    normal. The estimated ejection fraction was 50%. There is   akinesis of the basal-midinferolateral and inferior myocardium.   Left ventricular diastolic function parameters were normal. - Aortic valve: Mildly calcified annulus. Trileaflet. - Mitral valve: Mildly thickened leaflets . There was trivial   regurgitation. - Left atrium: The atrium was at the upper limits of normal in   size. - Right atrium: Central venous pressure (est): 3 mm Hg. - Atrial septum: No defect or patent foramen ovale was identified. - Tricuspid valve: There was trivial regurgitation. - Pulmonary arteries: PA peak pressure: 9 mm Hg (S). - Pericardium, extracardiac: There was no pericardial effusion.   Impressions:   - Normal LV wall thickness with LVEF approximately 50%. There is   akinesis of the mid to basal inferolateral/inferior wall. Normal   diastolic function. Upper normal left atrial chamber size. Mildly   thickened mitral leaflets with trivial mitral regurgitation.   Mildly sclerotic aortic valve. Trivial tricuspid regurgitation.   08/2017 nuclear stress There was no ST  segment deviation noted during stress. Findings consistent with large prior inferior/inferolateral myocardial infarction. This is an intermediate risk study. Risk based on decreased LVEF, there is no current myocardium at jeopardy. The left ventricular ejection fraction is mildly decreased (45%).       02/2021 cath CONCLUSIONS: Acute coronary syndrome with ostial 99% of circumflex progression.  First marginal 70%. 40 to 50% distal left main 40 to 50% mid LAD with 40% first diagonal Right coronary with diffuse 30 to 40% in-stent restenosis proximal and eccentric 50 to 60% mid to distal stenosis. LV systolic dysfunction with regional wall motion abnormality, and elevated LVEDP of 22 mmHg.  Findings are consistent with acute on chronic combined systolic and diastolic heart failure.   RECOMMENDATIONS:   IV Aggrastat  IV heparin  IV  nitro Notify [name]. Shyrl of patient's presence and requested consideration of surgical revascularization.  Could also discuss other treatment options which could include circumflex stent with extension into the left main however it would gel the LAD.  Left main stenting would not be an optimal long-term strategy for this patient given his age of 78. Was not loaded with P2 Y 12 agent.    Assessment and Plan  1. CAD - no recent symptoms, doing well 3 years out from CABG - continue curren tmeds  2. Hyperlipidemia -goal LDL would be <55. Above goal at last check, [name] Alphonsa has added zetia  to his atorvastatin  80mg  daily based on most recent panel. F/u upcoming labs with pcp   3. HTN - at goal, continue current meds  4. Carotid stenosis - mild by 2022 US , will repeat study    Dorn PHEBE Ross, M.D.

## 2024-02-21 NOTE — Patient Instructions (Signed)
 Medication Instructions:  Continue all current medications.  Labwork: none  Testing/Procedures: Your physician has requested that you have a carotid duplex. This test is an ultrasound of the carotid arteries in your neck. It looks at blood flow through these arteries that supply the brain with blood. Allow one hour for this exam. There are no restrictions or special instructions. Office will contact with results via phone, letter or mychart.     Follow-Up: 6 months   Any Other Special Instructions Will Be Listed Below (If Applicable).   If you need a refill on your cardiac medications before your next appointment, please call your pharmacy.

## 2024-03-13 ENCOUNTER — Ambulatory Visit: Attending: Cardiology

## 2024-03-13 DIAGNOSIS — I6529 Occlusion and stenosis of unspecified carotid artery: Secondary | ICD-10-CM

## 2024-03-13 DIAGNOSIS — I6523 Occlusion and stenosis of bilateral carotid arteries: Secondary | ICD-10-CM | POA: Diagnosis not present

## 2024-03-14 NOTE — Telephone Encounter (Signed)
 Called pt to schedule no answer left vm

## 2024-03-15 ENCOUNTER — Telehealth: Payer: Self-pay | Admitting: *Deleted

## 2024-03-15 ENCOUNTER — Encounter: Payer: Self-pay | Admitting: Gastroenterology

## 2024-03-15 ENCOUNTER — Ambulatory Visit: Admitting: Gastroenterology

## 2024-03-15 VITALS — BP 119/70 | HR 74 | Temp 98.2°F | Ht 70.0 in | Wt 153.6 lb

## 2024-03-15 DIAGNOSIS — K642 Third degree hemorrhoids: Secondary | ICD-10-CM | POA: Diagnosis not present

## 2024-03-15 DIAGNOSIS — K529 Noninfective gastroenteritis and colitis, unspecified: Secondary | ICD-10-CM

## 2024-03-15 DIAGNOSIS — R634 Abnormal weight loss: Secondary | ICD-10-CM

## 2024-03-15 MED ORDER — COLESTIPOL HCL 1 G PO TABS: 2.0000 g | ORAL_TABLET | Freq: Every day | ORAL | 2 refills | Status: DC

## 2024-03-15 NOTE — Telephone Encounter (Signed)
 [facility] PA:  Approved Authorization #[id number]  Tracking #[id number] Dates of service 03/15/2024 - 05/14/2024

## 2024-03-15 NOTE — Patient Instructions (Signed)
  Please avoid straining.  You should limit your toilet time to 2-3 minutes at the most.   I have sent in a prescription for colestipol tablets. Take 2 tablets each morning. If you start having constipation, decrease this to once a day.   I have ordered blood work and stool tests to further investigate your diarrhea. We are also updating the CT scan.   I will see you in follow-up for additional banding in several weeks.     I enjoyed seeing you again today! I value our relationship and want to provide genuine, compassionate, and quality care. You may receive a survey regarding your visit with me, and I welcome your feedback! Thanks so much for taking the time to complete this. I look forward to seeing you again.      Therisa MICAEL Stager, PhD, ANP-BC [facility] Gastroenterology

## 2024-03-15 NOTE — Progress Notes (Signed)
      [facility] BANDING PROCEDURE NOTE  [name] is a 68 y.o. male presenting today for consideration of hemorrhoid banding. Last colonoscopy Dec 2024: hemorrhoids, diverticula, TI normal. He has had banding in 2015 and then again in 2020. Notes recurrent rectal bleeding. He has had chronic diarrhea. postprandial urgency. Watery, loose. No postprandial abdominal pain. Not much of an appetite. Appears he has taken creon  and dicyclomine  in the past. Negative celiac serologies in the past. Imodium without much improvement. He has lost weight over the years and even more consistently over 2025 with high of 167 and now down to 153.    The patient presents with symptomatic grade 3 hemorrhoids, unresponsive to maximal medical therapy, requesting rubber band ligation of his hemorrhoidal disease. All risks, benefits, and alternative forms of therapy were described and informed consent was obtained.  In the left lateral decubitus position, anoscopic examination revealed grade 3 hemorrhoids in all positions.   The decision was made to band the left lateral internal hemorrhoid, and the [facility] O'Regan System was used to perform band ligation without complication. Digital anorectal examination was then performed to assure proper positioning of the band, and to adjust the banded tissue as required. The patient was discharged home without pain or other issues. Dietary and behavioral recommendations were given, along with follow-up instructions. The patient will return in several weeks for followup and possible additional banding as required.  No complications were encountered and the patient tolerated the procedure well.   Regarding diarrhea: I have started him on colestipol 2 grams daily. May need to decrease if constipation. Checking inflammatory markers, CBC, CMP, TSH, alpha gal panel, fecal elastase, fecal calprotectin. CT scan ordered due to persistent weight loss.   Therisa MICAEL Stager, PhD, ANP-BC [facility]  Gastroenterology

## 2024-03-15 NOTE — Telephone Encounter (Signed)
 [facility] PA for CT: Pending: In clinical review Tracking [phone]

## 2024-03-15 NOTE — Telephone Encounter (Signed)
 LMOVM to return call  CT scheduled for Tuesday 04/04/24, arrive at 3 pm to check in and start drinking contrast at [facility].

## 2024-03-16 NOTE — Telephone Encounter (Signed)
 LMOVM to return call.

## 2024-03-19 ENCOUNTER — Encounter (INDEPENDENT_AMBULATORY_CARE_PROVIDER_SITE_OTHER): Payer: Self-pay | Admitting: *Deleted

## 2024-03-19 ENCOUNTER — Encounter: Payer: Self-pay | Admitting: *Deleted

## 2024-03-19 ENCOUNTER — Ambulatory Visit: Payer: Self-pay | Admitting: Cardiology

## 2024-03-19 NOTE — Telephone Encounter (Signed)
 Left detailed message of CT appointment. If any questions to please call back. Letter also mailed with appt details

## 2024-03-22 NOTE — Progress Notes (Signed)
 This encounter was created in error - please disregard.

## 2024-03-23 ENCOUNTER — Other Ambulatory Visit: Payer: Self-pay | Admitting: Family Medicine

## 2024-03-23 DIAGNOSIS — K529 Noninfective gastroenteritis and colitis, unspecified: Secondary | ICD-10-CM | POA: Diagnosis not present

## 2024-03-26 LAB — COMPREHENSIVE METABOLIC PANEL WITH GFR
ALT: 29 IU/L (ref 0–44)
AST: 27 IU/L (ref 0–40)
Albumin: 4.4 g/dL (ref 3.9–4.9)
Alkaline Phosphatase: 98 IU/L (ref 47–123)
BUN/Creatinine Ratio: 12 (ref 10–24)
BUN: 11 mg/dL (ref 8–27)
Bilirubin Total: 1.2 mg/dL (ref 0.0–1.2)
CO2: 24 mmol/L (ref 20–29)
Calcium: 9.6 mg/dL (ref 8.6–10.2)
Chloride: 101 mmol/L (ref 96–106)
Creatinine, Ser: 0.89 mg/dL (ref 0.76–1.27)
Globulin, Total: 2.4 g/dL (ref 1.5–4.5)
Glucose: 105 mg/dL — ABNORMAL HIGH (ref 70–99)
Potassium: 3.7 mmol/L (ref 3.5–5.2)
Sodium: 143 mmol/L (ref 134–144)
Total Protein: 6.8 g/dL (ref 6.0–8.5)
eGFR: 94 mL/min/1.73 (ref >59)

## 2024-03-26 LAB — SEDIMENTATION RATE: Sed Rate: 2 mm/h (ref 0–30)

## 2024-03-26 LAB — ALPHA-GAL PANEL
Allergen Lamb IgE: 0.1 kU/L — AB
Beef IgE: 0.15 kU/L — AB
IgE (Immunoglobulin E), Serum: 163 [IU]/mL (ref 6–495)
O215-IgE Alpha-Gal: 1.66 kU/L — AB
Pork IgE: <0.1 kU/L

## 2024-03-26 LAB — CALPROTECTIN, FECAL: Calprotectin, Fecal: 92 ug/g (ref 0–120)

## 2024-03-26 LAB — PANCREATIC ELASTASE, FECAL: Pancreatic Elastase, Fecal: >800 ug Elast./g (ref >200)

## 2024-03-26 LAB — TSH: TSH: 0.843 u[IU]/mL (ref 0.450–4.500)

## 2024-03-26 LAB — C-REACTIVE PROTEIN: CRP: <1 mg/L (ref 0–10)

## 2024-03-29 ENCOUNTER — Ambulatory Visit: Admitting: Family Medicine

## 2024-03-29 VITALS — BP 111/76 | HR 76 | Temp 97.7°F | Wt 152.2 lb

## 2024-03-29 DIAGNOSIS — E782 Mixed hyperlipidemia: Secondary | ICD-10-CM | POA: Diagnosis not present

## 2024-03-29 DIAGNOSIS — K625 Hemorrhage of anus and rectum: Secondary | ICD-10-CM | POA: Diagnosis not present

## 2024-03-29 DIAGNOSIS — R55 Syncope and collapse: Secondary | ICD-10-CM

## 2024-03-29 DIAGNOSIS — I959 Hypotension, unspecified: Secondary | ICD-10-CM | POA: Diagnosis not present

## 2024-03-29 MED ORDER — HYDROCODONE-ACETAMINOPHEN 5-325 MG PO TABS: ORAL_TABLET | ORAL | 0 refills | Status: AC

## 2024-03-29 NOTE — Progress Notes (Signed)
 Subjective:    Patient ID: [name], male    DOB: 1955/08/23, 68 y.o.   MRN: [id number] CC: pain management follow up HPI 68 year old has arrived for follow up for pain main. No concerns stated today.  Is on a pill from the [facility] but doesn't recall the medication name.   This patient was seen today for chronic pain  The medication list was reviewed and updated.   Location of Pain for which the patient has been treated with regarding narcotics: Patient with significant pain in his lower back pain in his knees  Onset of this pain: This has been present for years   -Compliance with medication: He only takes 1 or 1-1/2/day during the day does not take it at bedtime denies drowsiness  - Number patient states they take daily: 1-1/2 daily  -Reason for ongoing use of opioids cannot get adequate relief with Tylenol  or NSAIDs  What other measures have been tried outside of opioids Tylenol  NSAIDs  In the ongoing specialists regarding this condition none currently  -when was the last dose patient took?  Past 24 hours  The patient was advised the importance of maintaining medication and not using illegal substances with these.  Here for refills and follow up  The patient was educated that we can provide 3 monthly scripts for their medication, it is their responsibility to follow the instructions.  Side effects or complications from medications: Denies side effects  Patient is aware that pain medications are meant to minimize the severity of the pain to allow their pain levels to improve to allow for better function. They are aware of that pain medications cannot totally remove their pain.  Due for UDT ( at least once per year) (pain management contract is also completed at the time of the UDT): Patient is due on next visit  Scale of 1 to 10 ( 1 is least 10 is most) Your pain level without the medicine: 7 Your pain level with medication 4  Scale 1 to 10 ( 1-helps very  little, 10 helps very well) How well does your pain medication reduce your pain so you can function better through out the day? 8  Quality of the pain: Throbbing aching  Persistence of the pain: Present intermittently  Modifying factors: Worse with activity  Discussed the use of AI scribe software for clinical note transcription with the patient, who gave verbal consent to proceed.  History of Present Illness   [name] is a 68 year old male who presents with episodes of passing out and shoulder pain after a fall.  He has experienced episodes of syncope, with the most recent incident occurring while carrying groceries up the steps, resulting in a fall and shoulder injury. These episodes have occurred a couple of times. He is awaiting feedback from cardiology and is scheduled for a test at the hospital soon.  He reports significant bleeding due to hemorrhoids and is undergoing banding treatment. A CT scan is scheduled to further investigate the issue, and he is awaiting results from a related test.  He has been diagnosed with mild carotid artery stenosis, identified during a carotid ultrasound. He is managing this condition with cholesterol control.  He has a mild allergy to red meat and pork, likely due to antibodies detected in a recent blood test. He experiences diarrhea and abdominal pain when consuming these meats and avoids them. He primarily consumes chicken, [place], and fish, as he finds it difficult to  chew meat due to issues with his dentures.  He experiences leg pain, particularly when walking, and uses hydrocodone  at a low dose for pain management, which he finds effective at times. He also mentions using medication for dizziness that provides some relief.  He is unsure of the exact dosage of valsartan  he is taking, but it is either 40 mg or 80 mg. He gets his [facility] from [facility].  He has been trying to maintain his nutrition despite recent illness,  forcing himself to eat, which he finds somewhat helpful.            Review of Systems  Constitutional:  Negative for activity change and appetite change.  Cardiovascular:  Negative for chest pain.  Gastrointestinal:  Positive for blood in stool. Negative for constipation, diarrhea, nausea and vomiting.  Musculoskeletal:  Negative for arthralgias.       Chronic pain in BLE.      03/29/2024    9:18 AM 06/23/2023   11:11 AM 04/14/2023    9:30 AM 03/18/2023    1:52 PM  GAD 7 : Generalized Anxiety Score  Nervous, Anxious, on Edge 2 2  0  Control/stop worrying 1 1  0  Worry too much - different things 2 1  1   Trouble relaxing 2 2  1   Restless  1  1  Easily annoyed or irritable 1 2  0  Afraid - awful might happen 1 1  0  Total GAD 7 Score  10  3  Anxiety Difficulty Somewhat difficult Somewhat difficult  Somewhat difficult     Information is confidential and restricted. Go to Review Flowsheets to unlock data.        03/29/2024    9:17 AM 01/13/2024    9:47 AM 12/23/2023    1:20 PM  Depression screen PHQ 2/9  Decreased Interest 2 1 1   Down, Depressed, Hopeless 3 1 1   PHQ - 2 Score 5 2 2   Altered sleeping 1 2 2   Tired, decreased energy 2 2 2   Change in appetite 2 2 2   Feeling bad or failure about yourself  1 1 1   Trouble concentrating 2 2 2   Moving slowly or fidgety/restless 1 0 0  Suicidal thoughts 0 0 0  PHQ-9 Score 14 11 11   Difficult doing work/chores Somewhat difficult  Somewhat difficult       Objective:   Physical Exam Constitutional:      General: He is not in acute distress.    Appearance: Normal appearance.  Cardiovascular:     Rate and Rhythm: Normal rate and regular rhythm.     Heart sounds: Normal heart sounds.  Pulmonary:     Effort: Pulmonary effort is normal.  Skin:    General: Skin is warm and dry.  Neurological:     Mental Status: He is alert.  Psychiatric:        Mood and Affect: Mood normal.        Behavior: Behavior normal.    Vitals:    03/29/24 0910  BP: 111/76  Pulse: 76  Temp: 97.7 F (36.5 C)  Weight: 69.1 kg  SpO2: 96%  BMI (Calculated): 21.85      Orthostatic hypotension with recurrent syncope Recurrent syncope likely due to orthostatic hypotension, possibly from overmedication. Low blood pressure noted. - Contact cardiologist to discuss potential heart monitor. - Reduce valsartan  dose from 80 mg to 40 mg after confirming current dosage with pharmacy. - Monitor blood pressure and symptoms closely.  Hemorrhoids with bleeding Significant bleeding from hemorrhoids. Undergoing banding treatment. CT scan scheduled to rule out other issues. - Continue with banding treatment as per gastroenterologist's plan. - Undergo scheduled CT scan.  Chronic lower extremity pain Chronic leg pain, especially when walking. Dizziness may relate to hypotension. - Continue current low-dose hydrocodone  as needed for pain management.  Carotid artery atherosclerosis, mild Mild carotid artery atherosclerosis. Emphasized cholesterol control. - Order cholesterol blood test to assess current levels. - Advise on maintaining cholesterol control through lifestyle modifications.  Hyperlipidemia Cholesterol levels were good in spring; reassessment needed. - Order cholesterol blood test.  Red meat allergy (alpha-gal syndrome) Mild allergy to red meat due to alpha-gal syndrome. - Avoid red meat and pork. - Consume chicken, [place], and fish as alternative protein sources.     Assessment & Plan:  1. Rectal bleeding (Primary) CBC await results - Lipid Profile  2. Mixed hyperlipidemia Lipid profile ordered continue medication - Lipid Profile  3. Hypotension, unspecified hypotension type Reduce medication monitor closely follow-up again in 4 weeks time to recheck blood pressure  4. Syncope, unspecified syncope type See discussion above I do not feel that this is any arrhythmia issues I think his hypertension patient is  orthostatic - CBC with Differential - Basic Metabolic Panel (BMET)   The patient was seen in followup for chronic pain. A review over at their current pain status was discussed. Drug registry was checked. Prescriptions were given.  Regular follow-up recommended. Discussion was held regarding the importance of compliance with medication as well as pain medication contract.  Patient was informed that medication may cause drowsiness and should not be combined  with other medications/alcohol or street drugs. If the patient feels medication is causing altered alertness then do not drive or operate dangerous equipment.  Should be noted that the patient appears to be meeting appropriate use of opioids and response.  Evidenced by improved function and decent pain control without significant side effects and no evidence of overt aberrancy issues.  Upon discussion with the patient today they understand that opioid therapy is optional and they feel that the pain has been refractory to reasonable conservative measures and is significant and affecting quality of life enough to warrant ongoing therapy and wishes to continue opioids.  Refills were provided.  [place]  medical Board guidelines regarding the pain medicine has been reviewed.  CDC guidelines most updated 2022 has been reviewed by the prescriber.  PDMP is checked on a regular basis yearly urine drug screen and pain management contract  Treatment plan for this patient includes #1-gentle stretching exercises as shown daily basis 2.  Mild strength exercises 3 times per week #3 continue pain medications #4 notify us  if any digression

## 2024-03-30 ENCOUNTER — Ambulatory Visit: Payer: Self-pay | Admitting: Gastroenterology

## 2024-03-30 ENCOUNTER — Ambulatory Visit: Payer: Self-pay | Admitting: Family Medicine

## 2024-03-30 LAB — CBC WITH DIFFERENTIAL/PLATELET
Basophils Absolute: 0 x10E3/uL (ref 0.0–0.2)
Basos: 1 %
EOS (ABSOLUTE): 0.1 x10E3/uL (ref 0.0–0.4)
Eos: 1 %
Hematocrit: 44.3 % (ref 37.5–51.0)
Hemoglobin: 15.4 g/dL (ref 13.0–17.7)
Immature Grans (Abs): 0 x10E3/uL (ref 0.0–0.1)
Immature Granulocytes: 0 %
Lymphocytes Absolute: 1.6 x10E3/uL (ref 0.7–3.1)
Lymphs: 23 %
MCH: 33.2 pg — ABNORMAL HIGH (ref 26.6–33.0)
MCHC: 34.8 g/dL (ref 31.5–35.7)
MCV: 96 fL (ref 79–97)
Monocytes Absolute: 0.4 x10E3/uL (ref 0.1–0.9)
Monocytes: 6 %
Neutrophils Absolute: 4.7 x10E3/uL (ref 1.4–7.0)
Neutrophils: 69 %
Platelets: 170 x10E3/uL (ref 150–450)
RBC: 4.64 x10E6/uL (ref 4.14–5.80)
RDW: 12.6 % (ref 11.6–15.4)
WBC: 6.8 x10E3/uL (ref 3.4–10.8)

## 2024-03-30 LAB — BASIC METABOLIC PANEL WITH GFR
BUN/Creatinine Ratio: 23 (ref 10–24)
BUN: 17 mg/dL (ref 8–27)
CO2: 23 mmol/L (ref 20–29)
Calcium: 9.2 mg/dL (ref 8.6–10.2)
Chloride: 102 mmol/L (ref 96–106)
Creatinine, Ser: 0.73 mg/dL — ABNORMAL LOW (ref 0.76–1.27)
Glucose: 83 mg/dL (ref 70–99)
Potassium: 3.2 mmol/L — ABNORMAL LOW (ref 3.5–5.2)
Sodium: 144 mmol/L (ref 134–144)
eGFR: 100 mL/min/1.73 (ref >59)

## 2024-03-30 LAB — LIPID PANEL
Chol/HDL Ratio: 2.3 ratio (ref 0.0–5.0)
Cholesterol, Total: 123 mg/dL (ref 100–199)
HDL: 54 mg/dL (ref >39)
LDL Chol Calc (NIH): 55 mg/dL (ref 0–99)
Triglycerides: 67 mg/dL (ref 0–149)
VLDL Cholesterol Cal: 14 mg/dL (ref 5–40)

## 2024-03-30 NOTE — Progress Notes (Signed)
 Called and left a message to inform patient of his lab result notes noted in chart.

## 2024-04-04 ENCOUNTER — Ambulatory Visit (HOSPITAL_COMMUNITY)
Admission: RE | Admit: 2024-04-04 | Discharge: 2024-04-04 | Disposition: A | Discharge status: Home / Self Care | Source: Ambulatory Visit | Attending: Gastroenterology | Admitting: Gastroenterology

## 2024-04-04 DIAGNOSIS — K529 Noninfective gastroenteritis and colitis, unspecified: Secondary | ICD-10-CM | POA: Diagnosis not present

## 2024-04-04 DIAGNOSIS — R634 Abnormal weight loss: Secondary | ICD-10-CM | POA: Insufficient documentation

## 2024-04-04 MED ORDER — IOHEXOL 9 MG/ML PO SOLN
ORAL | Status: AC
  Filled 2024-04-04: qty 1000

## 2024-04-04 MED ORDER — IOHEXOL 300 MG/ML  SOLN
100.0000 mL | Freq: Once | INTRAMUSCULAR | Status: AC | PRN
  Administered 2024-04-04: 100 mL via INTRAVENOUS

## 2024-04-08 ENCOUNTER — Other Ambulatory Visit (HOSPITAL_COMMUNITY): Payer: Self-pay | Admitting: Psychiatry

## 2024-04-09 NOTE — Telephone Encounter (Signed)
 Call for appt, last seen one year ago

## 2024-04-16 NOTE — Telephone Encounter (Signed)
 Called no answer left vm

## 2024-04-24 ENCOUNTER — Ambulatory Visit (INDEPENDENT_AMBULATORY_CARE_PROVIDER_SITE_OTHER): Admitting: Gastroenterology

## 2024-04-24 VITALS — BP 131/65 | HR 77 | Temp 98.6°F | Ht 70.0 in | Wt 153.2 lb

## 2024-04-24 DIAGNOSIS — K642 Third degree hemorrhoids: Secondary | ICD-10-CM

## 2024-04-24 MED ORDER — COLESTIPOL HCL 1 G PO TABS: 2.0000 g | ORAL_TABLET | Freq: Two times a day (BID) | ORAL | 0 refills | Status: AC

## 2024-04-24 MED ORDER — HYDROCORTISONE (PERIANAL) 2.5 % EX CREA: 1.0000 | TOPICAL_CREAM | Freq: Two times a day (BID) | CUTANEOUS | 1 refills | Status: AC

## 2024-04-24 NOTE — Progress Notes (Deleted)
 Gastroenterology Office Note     Primary Care Physician:  Alphonsa Glendia LABOR, MD  Primary Gastroenterologist:   Chief Complaint   Chief Complaint  Patient presents with   Follow-up    Pt here for a banding      History of Present Illness   [name] is a 68 y.o. male presenting today with a history of     69 y.o. male presenting today for consideration of hemorrhoid banding. Last colonoscopy  Dec 2024: hemorrhoids, diverticula, TI normal. He has had banding in 2015 and then again in 2020. Notes recurrent rectal bleeding. He has had chronic diarrhea. postprandial urgency. Watery, loose. No postprandial abdominal pain. Not much of an appetite. Appears he has taken creon  and dicyclomine  in the past. Negative celiac serologies in the past. Imodium without much improvement. He has lost weight over the years and even more consistently over 2025 with high of 167 and now down to 153, stable from visit in Oct 2025. We banded left lateral hemorrhoid Oct 2025.  Started on colestipol 2 grams daily at last visit. Inflammatory markers and fecal cal checked, along with CT.    Past Medical History:  Diagnosis Date   Anxiety    Anxiety and depression    ASCVD (arteriosclerotic cardiovascular disease)    a. s/p PTCA to LCx in 1993 b. BMS to RCA in 01/2012 with residual 80% OM2 stenosis and medical management recommended c. 08/2019: cath showing patent stents with occluded OM2 --> medical management. d. s/p CABG in 02/2021 with LIMA-LAD, reverse SVG-D1 and SVG-OM   Asthma    CAD (coronary artery disease)    Carcinoma in situ of colon 2004   rectal polyp   Colitis, ischemic 2011   COPD (chronic obstructive pulmonary disease) (HCC)    mild ;excercise induced hypoxemia by cp stress test ;asthma ,bronchitis,   Depression    Diverticulosis    Gastritis 12/30/2010   EGD Dr Shaaron   GERD (gastroesophageal reflux disease)    GI bleed    H. 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)    Leukocytosis    Dr Lake   Lung nodule 07/16/2011   Myocardial infarction [facility])    age 71   Restless leg syndrome    Schatzki's ring    Sleep apnea    does not use CPAP:cannot tolerate, PCP aware   Syncope    Tobacco abuse    50 pack years continuing at one halp pack daily   Tubular adenoma of colon 06/2009   Colonosocpy Dr Shaaron    Past Surgical History:  Procedure Laterality Date   BIOPSY  12/19/2017   Procedure: BIOPSY;  Surgeon: Shaaron Lamar HERO, MD;  [facility];  Service: Endoscopy;;  gastric   BIOPSY  11/05/2021   Procedure: BIOPSY;  Surgeon: Shaaron Lamar HERO, MD;  [facility];  Service: Endoscopy;;   CARDIAC CATHETERIZATION     CHOLECYSTECTOMY  2004   COLONOSCOPY  06/2009   normal terminal ileum, segmental mild inflammation of sigmoid colon (bx unremarkable), polyp, tubular adenoma   COLONOSCOPY N/A 07/31/2013   Dr.Rourk- redundant anal canal hemorrhoids, colonic diverticulosis, tubular adenoma   COLONOSCOPY W/ POLYPECTOMY  2004   rectal polyp with carcinoma in situ removed via colonoscopy   COLONOSCOPY WITH PROPOFOL  N/A 09/15/2015  Dr. Shaaron: Scattered diverticula throughout the colon, 2 sessile polyps found in the descending colon and cecum, 5 mm in size.  Cecal polyp was sessile serrated polyp, descending colon polyp was a tubular adenoma.  He had a abnormal perianal exam along with grade 3 hemorrhoids.  Surveillance exam recommended for 5-year follow-up.   COLONOSCOPY WITH PROPOFOL  N/A 12/19/2017   one 5 mm polyp in ascending colon and 1 cm sessile polyp in ascending s/p removal. Tubular adenoma. internal hemorrhoids   COLONOSCOPY WITH PROPOFOL  N/A 11/05/2021   Procedure: COLONOSCOPY WITH PROPOFOL ;  Surgeon: Shaaron Lamar HERO, MD;  [facility];  Service: Endoscopy;  Laterality: N/A;  12:30pm   CORONARY ANGIOPLASTY WITH STENT  PLACEMENT  01/26/2012   1; total is now 2   CORONARY ARTERY BYPASS GRAFT N/A 03/13/2021   Procedure: CORONARY ARTERY BYPASS GRAFTING (CABG), ON PUMP, TIMES THREE, USING LEFT INTERNAL MAMMARY ARTERY AND RIGHT ENDOSCOPICALLY HARVESTED GREATER SAPHENOUS VEIN;  Surgeon: Shyrl Linnie KIDD, MD;  Location: [facility];  Service: Open Heart Surgery;  Laterality: N/A;   CORONARY/GRAFT ACUTE MI REVASCULARIZATION N/A 03/10/2021   Procedure: Coronary/Graft Acute MI Revascularization;  Surgeon: Claudene Victory ORN, MD;  Location: [facility] INVASIVE CV LAB;  Service: Cardiovascular;  Laterality: N/A;   ENDOVEIN HARVEST OF GREATER SAPHENOUS VEIN Right 03/13/2021   Procedure: ENDOVEIN HARVEST OF GREATER SAPHENOUS VEIN;  Surgeon: Shyrl Linnie KIDD, MD;  Location: [facility] OR;  Service: Open Heart Surgery;  Laterality: Right;   ESOPHAGOGASTRODUODENOSCOPY  02/2009   query Barrett's but bx negative   ESOPHAGOGASTRODUODENOSCOPY  12/30/2010   Lamar HERO Rourk,gastritis, dilated 13F, sm HH, 1 small ulcer, Duodenal erosions, benign bx   ESOPHAGOGASTRODUODENOSCOPY (EGD) WITH ESOPHAGEAL DILATION N/A 09/12/2012   MFM:Wnwrmpuprjo Schatzki's ring s/p Maloney dilator. Small hiatal hernia. negative path   ESOPHAGOGASTRODUODENOSCOPY (EGD) WITH ESOPHAGEAL DILATION N/A 07/31/2013   Dr. Shaaron- normal egd, s/p [facility] dilation empirically. Normal small bowel biopsies    ESOPHAGOGASTRODUODENOSCOPY (EGD) WITH PROPOFOL  N/A 09/15/2015   Dr. Shaaron: Medium sized hiatal hernia, normal-appearing esophagus status post empiric dilation   ESOPHAGOGASTRODUODENOSCOPY (EGD) WITH PROPOFOL  N/A 12/19/2017   erosive esophagitis s/p dilation, erosive gastropathy, normal duodenum   ESOPHAGOGASTRODUODENOSCOPY (EGD) WITH PROPOFOL  N/A 09/17/2019   Non-obstructing Schatzki's ring s/p dilation, medium-sized hiatal hernia, otherwise normal.   ESOPHAGOGASTRODUODENOSCOPY (EGD) WITH PROPOFOL  N/A 11/05/2021   Procedure: ESOPHAGOGASTRODUODENOSCOPY (EGD) WITH PROPOFOL ;  Surgeon: Shaaron Lamar HERO, MD;  [facility];  Service: Endoscopy;  Laterality: N/A;   FLEXIBLE SIGMOIDOSCOPY  12/30/2010    Lamar HERO Rourk,; internal hemorrhoids, anal papilla   HAND SURGERY     surgical intervention for injury of the fingers of the left hand many years ago   heart stent     HEMORRHOID BANDING     Dr. Shaaron   LEFT HEART CATH AND CORONARY ANGIOGRAPHY N/A 08/29/2019   Procedure: LEFT HEART CATH AND CORONARY ANGIOGRAPHY;  Surgeon: Anner Alm ORN, MD;  Location: [facility] INVASIVE CV LAB;  Service: Cardiovascular;  Laterality: N/A;   LEFT HEART CATH AND CORONARY ANGIOGRAPHY N/A 03/10/2021   Procedure: LEFT HEART CATH AND CORONARY ANGIOGRAPHY;  Surgeon: Claudene Victory ORN, MD;  Location: [facility] INVAS[facility] CV LAB;  Service: Cardiovascular;  Laterality: N/A;   LEFT HEART CATHETERIZATION WITH CORONARY ANGIOGRAM N/A 01/26/2012   Procedure: LEFT HEART CATHETERIZATION WITH CORONARY ANGIOGRAM;  Surgeon: Lynwood Schilling, MD;  Location: [facility] CATH LAB;  Service: Cardiovascular;  Laterality: N/A;   MALONEY DILATION N/A 09/15/2015   Procedure: MALONEY DILATION;  Surgeon:  Lamar CHRISTELLA Hollingshead, MD;  [facility];  Service: Endoscopy;  Laterality: N/A;   MALONEY DILATION N/A 12/19/2017   Procedure: AGAPITO DILATION;  Surgeon: Hollingshead Lamar CHRISTELLA, MD;  [facility];  Service: Endoscopy;  Laterality: N/A;   MALONEY DILATION N/A 09/17/2019   Procedure: AGAPITO DILATION;  Surgeon: Hollingshead Lamar CHRISTELLA, MD;  [facility];  Service: Endoscopy;  Laterality: N/A;   MALONEY DILATION N/A 11/05/2021   Procedure: AGAPITO DILATION;  Surgeon: Hollingshead Lamar CHRISTELLA, MD;  [facility];  Service: Endoscopy;  Laterality: N/A;   NASAL SEPTOPLASTY W/ TURBINOPLASTY  10/06/2011   Procedure: NASAL SEPTOPLASTY WITH TURBINATE REDUCTION;  Surgeon: Ida Loader, MD;  Location: [facility];  Service: ENT;  Laterality: Bilateral;   PERCUTANEOUS CORONARY STENT INTERVENTION (PCI-S) N/A 01/26/2012   Procedure: PERCUTANEOUS CORONARY STENT INTERVENTION  (PCI-S);  Surgeon: Lynwood Schilling, MD;  Location: [facility] CATH LAB;  Service: Cardiovascular;  Laterality: N/A;   POLYPECTOMY  09/15/2015   Procedure: POLYPECTOMY;  Surgeon: Lamar CHRISTELLA Hollingshead, MD;  [facility];  Service: Endoscopy;;  Cecal polyp removed via cold snare/ Descending colon polyp removed via cold snare   POLYPECTOMY  12/19/2017   Procedure: POLYPECTOMY;  Surgeon: Hollingshead Lamar CHRISTELLA, MD;  [facility];  Service: Endoscopy;;  colon   TEE WITHOUT CARDIOVERSION N/A 03/13/2021   Procedure: TRANSESOPHAGEAL ECHOCARDIOGRAM (TEE);  Surgeon: Shyrl Linnie KIDD, MD;  Location: [facility] OR;  Service: Open Heart Surgery;  Laterality: N/A;    Current Outpatient Medications  Medication Sig Dispense Refill   albuterol  (VENTOLIN  HFA) 108 (90 Base) MCG/ACT inhaler Inhale 2 puffs into the lungs every 4 (four) hours as needed for wheezing or shortness of breath. 18 g 1   ALPRAZolam  (XANAX ) 1 MG tablet TAKE ONE TABLET BY MOUTH THREE TIMES DAILY 90 tablet 3   amLODipine  (NORVASC ) 5 MG tablet TAKE 1 TABLET EVERY DAY 90 tablet 3   aspirin  EC 81 MG tablet Take 81 mg by mouth daily.     atorvastatin  (LIPITOR ) 80 MG tablet Take 1 tablet (80 mg total) by mouth daily. 90 tablet 3   colestipol (COLESTID) 1 g tablet Take 2 tablets (2 g total) by mouth daily. 60 tablet 2   Cyanocobalamin  (VITAMIN B-12 PO) Take 1 tablet by mouth in the morning.     ezetimibe  (ZETIA ) 10 MG tablet Take 1 tablet (10 mg total) by mouth daily. 90 tablet 3   FLUoxetine  (PROZAC ) 40 MG capsule TAKE 1 CAPSULE TWICE DAILY 180 capsule 3   gabapentin  (NEURONTIN ) 300 MG capsule Take two tablets three times a day 540 capsule 3   HYDROcodone -acetaminophen  (NORCO/VICODIN) 5-325 MG tablet 1/2 tablet to 1 tablet twice daily as needed maximum 45 tablets/month 45 tablet 0   HYDROcodone -acetaminophen  (NORCO/VICODIN) 5-325 MG tablet 1/2 tablet to 1 tablet twice daily as needed maximum 45 tablets for 30 days 45 tablet 0   HYDROcodone -acetaminophen   (NORCO/VICODIN) 5-325 MG tablet 1/2-1 twice daily as needed pain prescription to last 30 days 45 tablet 0   meclizine  (ANTIVERT ) 25 MG tablet TAKE ONE TABLET BY MOUTH THREE TIMES DAILY AS NEEDED FOR DIZZINESS 30 tablet 3   meloxicam  (MOBIC ) 15 MG tablet TAKE 1 TABLET EVERY DAY 90 tablet 3   nitroGLYCERIN  (NITROSTAT ) 0.4 MG SL tablet Place 1 tablet (0.4 mg total) under the tongue every 5 (five) minutes as needed for chest pain. 20 tablet 5   OLANZapine  (ZYPREXA ) 10 MG tablet TAKE 1 TABLET AT BEDTIME 90  tablet 3   rOPINIRole  (REQUIP ) 2 MG tablet TAKE 1 TABLET AT BEDTIME 90 tablet 3   tamsulosin  (FLOMAX ) 0.4 MG CAPS capsule Take 1 capsule (0.4 mg total) by mouth daily. 90 capsule 3   traZODone  (DESYREL ) 50 MG tablet TAKE 1 TABLET AT BEDTIME 90 tablet 3   valsartan  (DIOVAN ) 80 MG tablet Take 80 mg by mouth daily.     No current facility-administered medications for this visit.    Allergies as of 04/24/2024   (No Known Allergies)    Family History  Problem Relation Age of Onset   Lung disease Father        deceased, black lung   Heart disease Mother        blood clots   Depression Mother    Cancer Paternal Uncle        unknown type   Cancer Maternal Aunt        unknown type   Kidney failure Maternal Uncle    Hypertension Brother    Colon cancer Neg Hx    ADD / ADHD Neg Hx    Alcohol abuse Neg Hx    Drug abuse Neg Hx    Anxiety disorder Neg Hx    Bipolar disorder Neg Hx    Dementia Neg Hx    OCD Neg Hx    Paranoid behavior Neg Hx    Schizophrenia Neg Hx    Physical abuse Neg Hx    Sexual abuse Neg Hx    Seizures Neg Hx     Social History   Socioeconomic History   Marital status: Married    Spouse name: Not on file   Number of children: 3   Years of education: Not on file   Highest education level: Not on file  Occupational History   Occupation: disable    Employer: RETIRED    Comment: [employer]  Tobacco Use   Smoking status: Former    Current packs/day: 0.00    Average  packs/day: 1 pack/day for 51.6 years (51.6 ttl pk-yrs)    Types: Cigarettes    Start date: 07/31/1969    Quit date: 03/16/2021    Years since quitting: 3.1    Passive exposure: Current   Smokeless tobacco: Former  Building Services Engineer status: Never Used  Substance and Sexual Activity   Alcohol use: Yes    Comment: 3 beer today   Drug use: No   Sexual activity: Not Currently  Other Topics Concern   Not on file  Social History Narrative   3 stepchildren   Social Drivers of Health   Financial Resource Strain: Low Risk  (01/13/2024)   Overall Financial Resource Strain (CARDIA)    Difficulty of Paying Living Expenses: Not hard at all  Food Insecurity: No Food Insecurity (01/13/2024)   Hunger Vital Sign    Worried About Running Out of Food in the Last Year: Never true    Ran Out of Food in the Last Year: Never true  Transportation Needs: No Transportation Needs (01/13/2024)   PRAPARE - Administrator, Civil Service (Medical): No    Lack of Transportation (Non-Medical): No  Physical Activity: Inactive (01/13/2024)   Exercise Vital Sign    Days of Exercise per Week: 0 days    Minutes of Exercise per Session: 0 min  Stress: No Stress Concern Present (01/13/2024)   [employer] of Occupational Health - Occupational Stress Questionnaire    Feeling of Stress: Not at all  Social  Connections: Moderately Integrated (01/13/2024)   Social Connection and Isolation Panel    Frequency of Communication with Friends and Family: More than three times a week    Frequency of Social Gatherings with Friends and Family: Three times a week    Attends Religious Services: 1 to 4 times per year    Active Member of Clubs or Organizations: No    Attends Banker Meetings: Never    Marital Status: Married  Catering Manager Violence: Not At Risk (01/13/2024)   Humiliation, Afraid, Rape, and Kick questionnaire    Fear of Current or Ex-Partner: No    Emotionally Abused: No    Physically  Abused: No    Sexually Abused: No     Review of Systems   Gen: Denies any fever, chills, fatigue, weight loss, lack of appetite.  CV: Denies chest pain, heart palpitations, peripheral edema, syncope.  Resp: Denies shortness of breath at rest or with exertion. Denies wheezing or cough.  GI: Denies dysphagia or odynophagia. Denies jaundice, hematemesis, fecal incontinence. GU : Denies urinary burning, urinary frequency, urinary hesitancy MS: Denies joint pain, muscle weakness, cramps, or limitation of movement.  Derm: Denies rash, itching, dry skin Psych: Denies depression, anxiety, memory loss, and confusion Heme: Denies bruising, bleeding, and enlarged lymph nodes.   Physical Exam   BP 131/65   Pulse 77   Temp 98.6 F (37 C)   Ht 5' 10 (1.778 m)   Wt 153 lb 3.2 oz (69.5 kg)   BMI 21.98 kg/m  General:   Alert and oriented. Pleasant and cooperative. Well-nourished and well-developed.  Head:  Normocephalic and atraumatic. Eyes:  Without icterus Abdomen:  +BS, soft, non-tender and non-distended. No HSM noted. No guarding or rebound. No masses appreciated.  Rectal:  Deferred  Msk:  Symmetrical without gross deformities. Normal posture. Extremities:  Without edema. Neurologic:  Alert and  oriented x4;  grossly normal neurologically. Skin:  Intact without significant lesions or rashes. Psych:  Alert and cooperative. Normal mood and affect.   Assessment   ARIEN BENINCASA is a 68 y.o. male presenting today with a history of    PLAN   *****    [name] MICAEL Stager, PhD, ANP-BC [facility] Gastroenterology

## 2024-04-24 NOTE — Patient Instructions (Signed)
 Let's increase colestipol to 2 grams twice a day. Monitor for constipation and hold off on the second dose if this occurs.  Avoid straining, limit toilet time to 2-3 minutes at the most.  I sent in a cream to use on external hemorrhoids twice a day as needed for discomfort, burning, swelling.   I will see you in 2 weeks for additional banding!  I enjoyed seeing you again today! I value our relationship and want to provide genuine, compassionate, and quality care. You may receive a survey regarding your visit with me, and I welcome your feedback! Thanks so much for taking the time to complete this. I look forward to seeing you again.      Therisa MICAEL Stager, PhD, ANP-BC [facility] Gastroenterology

## 2024-04-24 NOTE — Progress Notes (Signed)
      [facility] BANDING PROCEDURE NOTE  [name] is a 68 y.o. male presenting today for consideration of hemorrhoid banding. Last colonoscopy Dec 2024: hemorrhoids, diverticula, TI normal. He has had banding in 2015 and then again in 2020. Notes recurrent rectal bleeding. He has had chronic diarrhea. Evaluation with CT, labs, stool tests as noted in interim from last visit. Some improvement with colestipol 2 grams daily. Stress has been an issue. Raising grandchildren, brother died recently.   The patient presents with symptomatic grade 3 hemorrhoids, unresponsive to maximal medical therapy, requesting rubber band ligation of his hemorrhoidal disease. All risks, benefits, and alternative forms of therapy were described and informed consent was obtained.   The decision was made to band the right posterior internal hemorrhoid, and the [facility] O'Regan System was used to perform band ligation without complication. Digital anorectal examination was then performed to assure proper positioning of the band, and to adjust the banded tissue as required. The patient was discharged home without pain or other issues. Dietary and behavioral recommendations were given, along with follow-up instructions. The patient will return in several weeks for followup and possible additional banding as required. Increase colestipol to 2 grams BID instead of daily, anusol  cream BID externally, consider low dose TCA in future.   No complications were encountered and the patient tolerated the procedure well.   Therisa MICAEL Stager, PhD, ANP-BC [facility] Gastroenterology

## 2024-04-30 ENCOUNTER — Other Ambulatory Visit: Payer: Self-pay | Admitting: Family Medicine

## 2024-04-30 MED ORDER — POTASSIUM CHLORIDE CRYS ER 10 MEQ PO TBCR: 10.0000 meq | EXTENDED_RELEASE_TABLET | Freq: Two times a day (BID) | ORAL | 0 refills | Status: AC

## 2024-05-01 NOTE — Progress Notes (Signed)
 Letter sent

## 2024-05-02 ENCOUNTER — Ambulatory Visit (INDEPENDENT_AMBULATORY_CARE_PROVIDER_SITE_OTHER): Admitting: Psychiatry

## 2024-05-02 ENCOUNTER — Encounter (HOSPITAL_COMMUNITY): Payer: Self-pay | Admitting: Psychiatry

## 2024-05-02 VITALS — BP 155/100 | HR 93 | Ht 70.0 in | Wt 152.2 lb

## 2024-05-02 DIAGNOSIS — F418 Other specified anxiety disorders: Secondary | ICD-10-CM

## 2024-05-02 DIAGNOSIS — F5105 Insomnia due to other mental disorder: Secondary | ICD-10-CM | POA: Diagnosis not present

## 2024-05-02 MED ORDER — ALPRAZOLAM 1 MG PO TABS: 1.0000 mg | ORAL_TABLET | Freq: Three times a day (TID) | ORAL | 3 refills | Status: AC

## 2024-05-02 MED ORDER — OLANZAPINE 10 MG PO TABS
10.0000 mg | ORAL_TABLET | Freq: Every day | ORAL | 3 refills | Status: AC
Start: 2024-05-02 — End: ?

## 2024-05-02 MED ORDER — TRAZODONE HCL 50 MG PO TABS
50.0000 mg | ORAL_TABLET | Freq: Every day | ORAL | 3 refills | Status: AC
Start: 2024-05-02 — End: ?

## 2024-05-02 MED ORDER — FLUOXETINE HCL 40 MG PO CAPS: 40.0000 mg | ORAL_CAPSULE | Freq: Two times a day (BID) | ORAL | 3 refills | Status: AC

## 2024-05-02 MED ORDER — GABAPENTIN 300 MG PO CAPS: ORAL_CAPSULE | ORAL | 3 refills | Status: AC

## 2024-05-02 NOTE — Progress Notes (Signed)
 [facility] MD/PA/NP OP Progress Note  05/02/2024 11:52 AM [name]  MRN:  [id number]  Chief Complaint:  Chief Complaint  Patient presents with   Anxiety   Depression   Follow-up   HPI: This patient is a 68 year old married white male who lives with his wife and 2 grandchildren in [place].  He is on disability for coronary artery disease and COPD.  He used to work for the [employer].  The patient returns for follow-up after about 12 months regarding his major depressive disorder and generalized anxiety disorder.  The patient is generally doing okay.  He has had some health issues.  He continues to lose weight and sometimes just does not feel like eating.  He was thought to have possible alpha gal and is supposed to be avoiding red meat and lamb.  He is struggling with one of his grandchildren who is a teenage girl who is very disobedient and difficult.  However he denies significant depression thoughts of self-harm or suicide.  He is generally sleeping okay.  His anxiety is under fair control. Visit Diagnosis:    ICD-10-CM   1. Depression with anxiety  F41.8     2. 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 s/p PTCA to LCx in 1993 b. BMS to RCA in 01/2012 with residual 80% OM2 stenosis and medical management recommended c. 08/2019: cath showing patent stents with occluded OM2 --> medical management. d. s/p CABG in 02/2021 with LIMA-LAD, reverse SVG-D1 and SVG-OM   Asthma    CAD (coronary artery disease)    Carcinoma in situ of colon 2004   rectal polyp   Colitis, ischemic 2011   COPD (chronic obstructive pulmonary disease) (HCC)    mild ;excercise induced hypoxemia by cp stress test ;asthma ,bronchitis,   Depression    Diverticulosis    Gastritis 12/30/2010   EGD Dr Shaaron    GERD (gastroesophageal reflux disease)    GI bleed    H. 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)    Leukocytosis    Dr Lake   Lung nodule 07/16/2011   Myocardial infarction [facility])    age 87   Restless leg syndrome    Schatzki's ring    Sleep apnea    does not use CPAP:cannot tolerate, PCP aware   Syncope    Tobacco abuse    50 pack years continuing at one halp pack daily   Tubular adenoma of colon 06/2009   Colonosocpy Dr Shaaron    Past Surgical History:  Procedure Laterality Date   BIOPSY  12/19/2017   Procedure: BIOPSY;  Surgeon: Shaaron Lamar HERO, MD;  [facility];  Service: Endoscopy;;  gastric   BIOPSY  11/05/2021   Procedure: BIOPSY;  Surgeon: Shaaron Lamar HERO, MD;  [facility];  Service: Endoscopy;;   CARDIAC CATHETERIZATION     CHOLECYSTECTOMY  2004   COLONOSCOPY  06/2009   normal terminal ileum, segmental mild inflammation of sigmoid colon (bx unremarkable), polyp, tubular adenoma   COLONOSCOPY N/A 07/31/2013   Dr.Rourk- redundant anal canal hemorrhoids, colonic  diverticulosis, tubular adenoma   COLONOSCOPY W/ POLYPECTOMY  2004   rectal polyp with carcinoma in situ removed via colonoscopy   COLONOSCOPY WITH PROPOFOL  N/A 09/15/2015   Dr. Shaaron: Scattered diverticula throughout the colon, 2 sessile polyps found in the descending colon and cecum, 5 mm in size.  Cecal polyp was sessile serrated polyp, descending colon polyp was a tubular adenoma.  He had a abnormal perianal exam along with grade 3 hemorrhoids.  Surveillance exam recommended for 5-year follow-up.   COLONOSCOPY WITH PROPOFOL  N/A 12/19/2017   one 5 mm polyp in ascending colon and 1 cm sessile polyp in ascending s/p removal. Tubular adenoma. internal hemorrhoids   COLONOSCOPY WITH PROPOFOL  N/A 11/05/2021   Procedure: COLONOSCOPY WITH PROPOFOL ;  Surgeon: Shaaron Lamar HERO, MD;   [facility];  Service: Endoscopy;  Laterality: N/A;  12:30pm   CORONARY ANGIOPLASTY WITH STENT PLACEMENT  01/26/2012   1; total is now 2   CORONARY ARTERY BYPASS GRAFT N/A 03/13/2021   Procedure: CORONARY ARTERY BYPASS GRAFTING (CABG), ON PUMP, TIMES THREE, USING LEFT INTERNAL MAMMARY ARTERY AND RIGHT ENDOSCOPICALLY HARVESTED GREATER SAPHENOUS VEIN;  Surgeon: Shyrl Linnie KIDD, MD;  [facility];  Service: Open Heart Surgery;  Laterality: N/A;   CORONARY/GRAFT ACUTE MI REVASCULARIZATION N/A 03/10/2021   Procedure: Coronary/Graft Acute MI Revascularization;  Surgeon: Claudene Victory ORN, MD;  Location: [facility] INVASIVE CV LAB;  Service: Cardiovascular;  Laterality: N/A;   ENDOVEIN HARVEST OF GREATER SAPHENOUS VEIN Right 03/13/2021   Procedure: ENDOVEIN HARVEST OF GREATER SAPHENOUS VEIN;  Surgeon: Shyrl Linnie KIDD, MD;  [facility];  Service: Open Heart Surgery;  Laterality: Right;   ESOPHAGOGASTRODUODENOSCOPY  02/2009   query Barrett's but bx negative   ESOPHAGOGASTRODUODENOSCOPY  12/30/2010   Lamar HERO Rourk,gastritis, dilated 23F, sm HH, 1 small ulcer, Duodenal erosions, benign bx   ESOPHAGOGASTRODUODENOSCOPY (EGD) WITH ESOPHAGEAL DILATION N/A 09/12/2012   MFM:Wnwrmpuprjo Schatzki's ring s/p Maloney dilator. Small hiatal hernia. negative path   ESOPHAGOGASTRODUODENOSCOPY (EGD) WITH ESOPHAGEAL DILATION N/A 07/31/2013   Dr. Shaaron- normal egd, s/p [facility] dilation empirically. Normal small bowel biopsies    ESOPHAGOGASTRODUODENOSCOPY (EGD) WITH PROPOFOL  N/A 09/15/2015   Dr. Shaaron: Medium sized hiatal hernia, normal-appearing esophagus status post empiric dilation   ESOPHAGOGASTRODUODENOSCOPY (EGD) WITH PROPOFOL  N/A 12/19/2017   erosive esophagitis s/p dilation, erosive gastropathy, normal duodenum   ESOPHAGOGASTRODUODENOSCOPY (EGD) WITH PROPOFOL  N/A 09/17/2019   Non-obstructing Schatzki's ring s/p dilation, medium-sized hiatal hernia, otherwise normal.   ESOPHAGOGASTRODUODENOSCOPY (EGD) WITH  PROPOFOL  N/A 11/05/2021   Procedure: ESOPHAGOGASTRODUODENOSCOPY (EGD) WITH PROPOFOL ;  Surgeon: Shaaron Lamar HERO, MD;  [facility];  Service: Endoscopy;  Laterality: N/A;   FLEXIBLE SIGMOIDOSCOPY  12/30/2010    Lamar HERO Rourk,; internal hemorrhoids, anal papilla   HAND SURGERY     surgical intervention for injury of the fingers of the left hand many years ago   heart stent     HEMORRHOID BANDING     Dr. Shaaron   LEFT HEART CATH AND CORONARY ANGIOGRAPHY N/A 08/29/2019   Procedure: LEFT HEART CATH AND CORONARY ANGIOGRAPHY;  Surgeon: Anner Alm ORN, MD;  [facility] INVAS[facility] CV LAB;  Service: Cardiovascular;  Laterality: N/A;   LEFT HEART CATH AND CORONARY ANGIOGRAPHY N/A 03/10/2021   Procedure: LEFT HEART CATH AND CORONARY ANGIOGRAPHY;  Surgeon: Claudene Victory ORN, MD;  [facility];  Service: Cardiovascular;  Laterality: N/A;   LEFT HEART CATHETERIZATION WITH CORONARY ANGIOGRAM N/A 01/26/2012   Procedure: LEFT HEART CATHETERIZATION WITH CORONARY  ANGIOGRAM;  Surgeon: Lynwood Schilling, MD;  Location: [facility] CATH LAB;  Service: Cardiovascular;  Laterality: N/A;   MALONEY DILATION N/A 09/15/2015   Procedure: AGAPITO DILATION;  Surgeon: Lamar CHRISTELLA Hollingshead, MD;  [facility];  Service: Endoscopy;  Laterality: N/A;   MALONEY DILATION N/A 12/19/2017   Procedure: AGAPITO DILATION;  Surgeon: Hollingshead Lamar CHRISTELLA, MD;  [facility];  Service: Endoscopy;  Laterality: N/A;   MALONEY DILATION N/A 09/17/2019   Procedure: AGAPITO DILATION;  Surgeon: Hollingshead Lamar CHRISTELLA, MD;  [facility];  Service: Endoscopy;  Laterality: N/A;   MALONEY DILATION N/A 11/05/2021   Procedure: AGAPITO DILATION;  Surgeon: Hollingshead Lamar CHRISTELLA, MD;  [facility];  Service: Endoscopy;  Laterality: N/A;   NASAL SEPTOPLASTY W/ TURBINOPLASTY  10/06/2011   Procedure: NASAL SEPTOPLASTY WITH TURBINATE REDUCTION;  Surgeon: Ida Loader, MD;  [facility];  Service: ENT;  Laterality: Bilateral;    PERCUTANEOUS CORONARY STENT INTERVENTION (PCI-S) N/A 01/26/2012   Procedure: PERCUTANEOUS CORONARY STENT INTERVENTION (PCI-S);  Surgeon: Lynwood Schilling, MD;  Location: [facility] CATH LAB;  Service: Cardiovascular;  Laterality: N/A;   POLYPECTOMY  09/15/2015   Procedure: POLYPECTOMY;  Surgeon: Lamar CHRISTELLA Hollingshead, MD;  [facility];  Service: Endoscopy;;  Cecal polyp removed via cold snare/ Descending colon polyp removed via cold snare   POLYPECTOMY  12/19/2017   Procedure: POLYPECTOMY;  Surgeon: Hollingshead Lamar CHRISTELLA, MD;  [facility];  Service: Endoscopy;;  colon   TEE WITHOUT CARDIOVERSION N/A 03/13/2021   Procedure: TRANSESOPHAGEAL ECHOCARDIOGRAM (TEE);  Surgeon: Shyrl Linnie KIDD, MD;  Location: [facility] OR;  Service: Open Heart Surgery;  Laterality: N/A;    Family Psychiatric History: See below  Family History:  Family History  Problem Relation Age of Onset   Lung disease Father        deceased, black lung   Heart disease Mother        blood clots   Depression Mother    Cancer Paternal Uncle        unknown type   Cancer Maternal Aunt        unknown type   Kidney failure Maternal Uncle    Hypertension Brother    Colon cancer Neg Hx    ADD / ADHD Neg Hx    Alcohol abuse Neg Hx    Drug abuse Neg Hx    Anxiety disorder Neg Hx    Bipolar disorder Neg Hx    Dementia Neg Hx    OCD Neg Hx    Paranoid behavior Neg Hx    Schizophrenia Neg Hx    Physical abuse Neg Hx    Sexual abuse Neg Hx    Seizures Neg Hx     Social History:  Social History   Socioeconomic History   Marital status: Married    Spouse name: Not on file   Number of children: 3   Years of education: Not on file   Highest education level: Not on file  Occupational History   Occupation: disable    Employer: RETIRED    Comment: [employer]  Tobacco Use   Smoking status: Former    Current packs/day: 0.00    Average packs/day: 1 pack/day for 51.6 years (51.6 ttl pk-yrs)    Types: Cigarettes    Start date: 07/31/1969     Quit date: 03/16/2021    Years since quitting: 3.1    Passive exposure: Current   Smokeless tobacco: Former  [employer]vertising [employer]  Vaping status: Never Used  Substance and Sexual Activity   Alcohol use: Yes    Comment: 3 beer today   Drug use: No   Sexual activity: Not Currently  Other Topics Concern   Not on file  Social History Narrative   3 stepchildren   Social Drivers of Health   Financial Resource Strain: Low Risk  (01/13/2024)   Overall Financial Resource Strain (CARDIA)    Difficulty of Paying Living Expenses: Not hard at all  Food Insecurity: No Food Insecurity (01/13/2024)   Hunger Vital Sign    Worried About Running Out of Food in the Last Year: Never true    Ran Out of Food in the Last Year: Never true  Transportation Needs: No Transportation Needs (01/13/2024)   PRAPARE - Administrator, Civil Service (Medical): No    Lack of Transportation (Non-Medical): No  Physical Activity: Inactive (01/13/2024)   Exercise Vital Sign    Days of Exercise per Week: 0 days    Minutes of Exercise per Session: 0 min  Stress: No Stress Concern Present (01/13/2024)   [employer] of Occupational Health - Occupational Stress Questionnaire    Feeling of Stress: Not at all  Social Connections: Moderately Integrated (01/13/2024)   Social Connection and Isolation Panel    Frequency of Communication with Friends and Family: More than three times a week    Frequency of Social Gatherings with Friends and Family: Three times a week    Attends Religious Services: 1 to 4 times per year    Active Member of Clubs or Organizations: No    Attends Banker Meetings: Never    Marital Status: Married    Allergies: No Known Allergies  Metabolic Disorder Labs: Lab Results  Component Value Date   HGBA1C 5.3 07/15/2022   MPG 94 03/11/2021   MPG 103 10/29/2013   No results found for: PROLACTIN Lab Results  Component Value Date   CHOL 123 03/29/2024   TRIG 67 03/29/2024   HDL  54 03/29/2024   CHOLHDL 2.3 03/29/2024   VLDL 16 03/11/2021   LDLCALC 55 03/29/2024   LDLCALC 80 09/21/2023   Lab Results  Component Value Date   TSH 0.843 03/23/2024   TSH 1.154 03/11/2021    Therapeutic Level Labs: No results found for: LITHIUM No results found for: VALPROATE No results found for: CBMZ  Current Medications: Current Outpatient Medications  Medication Sig Dispense Refill   albuterol  (VENTOLIN  HFA) 108 (90 Base) MCG/ACT inhaler Inhale 2 puffs into the lungs every 4 (four) hours as needed for wheezing or shortness of breath. 18 g 1   amLODipine  (NORVASC ) 5 MG tablet TAKE 1 TABLET EVERY DAY 90 tablet 3   aspirin  EC 81 MG tablet Take 81 mg by mouth daily.     atorvastatin  (LIPITOR ) 80 MG tablet Take 1 tablet (80 mg total) by mouth daily. 90 tablet 3   colestipol (COLESTID) 1 g tablet Take 2 tablets (2 g total) by mouth 2 (two) times daily. 120 tablet 0   Cyanocobalamin  (VITAMIN B-12 PO) Take 1 tablet by mouth in the morning.     ezetimibe  (ZETIA ) 10 MG tablet Take 1 tablet (10 mg total) by mouth daily. 90 tablet 3   HYDROcodone -acetaminophen  (NORCO/VICODIN) 5-325 MG tablet 1/2 tablet to 1 tablet twice daily as needed maximum 45 tablets/month 45 tablet 0   HYDROcodone -acetaminophen  (NORCO/VICODIN) 5-325 MG tablet 1/2 tablet to 1 tablet twice daily as needed maximum 45 tablets for 30  days 45 tablet 0   HYDROcodone -acetaminophen  (NORCO/VICODIN) 5-325 MG tablet 1/2-1 twice daily as needed pain prescription to last 30 days 45 tablet 0   hydrocortisone  (ANUSOL -HC) 2.5 % rectal cream Place 1 Application rectally 2 (two) times daily. 30 g 1   meclizine  (ANTIVERT ) 25 MG tablet TAKE ONE TABLET BY MOUTH THREE TIMES DAILY AS NEEDED FOR DIZZINESS 30 tablet 3   meloxicam  (MOBIC ) 15 MG tablet TAKE 1 TABLET EVERY DAY 90 tablet 3   nitroGLYCERIN  (NITROSTAT ) 0.4 MG SL tablet Place 1 tablet (0.4 mg total) under the tongue every 5 (five) minutes as needed for chest pain. 20 tablet 5    potassium chloride  (KLOR-CON  M) 10 MEQ tablet Take 1 tablet (10 mEq total) by mouth 2 (two) times daily. 28 tablet 0   rOPINIRole  (REQUIP ) 2 MG tablet TAKE 1 TABLET AT BEDTIME 90 tablet 3   tamsulosin  (FLOMAX ) 0.4 MG CAPS capsule Take 1 capsule (0.4 mg total) by mouth daily. 90 capsule 3   valsartan  (DIOVAN ) 80 MG tablet Take 80 mg by mouth daily.     ALPRAZolam  (XANAX ) 1 MG tablet Take 1 tablet (1 mg total) by mouth 3 (three) times daily. 90 tablet 3   FLUoxetine  (PROZAC ) 40 MG capsule Take 1 capsule (40 mg total) by mouth 2 (two) times daily. 180 capsule 3   gabapentin  (NEURONTIN ) 300 MG capsule Take two tablets three times a day 540 capsule 3   OLANZapine  (ZYPREXA ) 10 MG tablet Take 1 tablet (10 mg total) by mouth at bedtime. 90 tablet 3   traZODone  (DESYREL ) 50 MG tablet Take 1 tablet (50 mg total) by mouth at bedtime. 90 tablet 3   No current facility-administered medications for this visit.     Musculoskeletal: Strength & Muscle Tone: within normal limits Gait & Station: normal Patient leans: N/A  Psychiatric Specialty Exam: Review of Systems  Constitutional:  Positive for appetite change and unexpected weight change.  Musculoskeletal:  Positive for back pain.  All other systems reviewed and are negative.   Blood pressure (!) 155/100, pulse 93, height 5' 10 (1.778 m), weight 152 lb 3.2 oz (69 kg), SpO2 96%.Body mass index is 21.84 kg/m.  General Appearance: Casual and Fairly Groomed  Eye Contact:  Good  Speech:  Clear and Coherent  Volume:  Normal  Mood:  Euthymic  Affect:  Congruent  Thought Process:  Goal Directed  Orientation:  Full (Time, Place, and Person)  Thought Content: Rumination   Suicidal Thoughts:  No  Homicidal Thoughts:  No  Memory:  Immediate;   Good Recent;   Good Remote;   NA  Judgement:  Good  Insight:  Fair  Psychomotor Activity:  Normal  Concentration:  Concentration: Good and Attention Span: Good  Recall:  Good  Fund of Knowledge: Good   Language: Good  Akathisia:  No  Handed:  Right  AIMS (if indicated): not done  Assets:  Communication Skills Desire for Improvement Resilience Social Support  ADL's:  Intact  Cognition: WNL  Sleep:  Good   Screenings: GAD-7    Flowsheet Row Office Visit from 05/02/2024 in [name] at [place] Office Visit from 06/23/2023 in [facility] [place] [facility] Office Visit from 04/14/2023 in [facility] at [place] Office Visit from 03/18/2023 in [facility] [place] [facility] Office Visit from 11/22/2022 in [facility] [place] [facility]  Total GAD-7 Score 4 10 7 3 4    [employer]    Flowsheet Row Office  Visit from 05/02/2024 in [facility] at [place] Office Visit from 03/29/2024 in [facility] Clinical Support from 01/13/2024 in [facility] Office Visit from 12/23/2023 in [facility] Office Visit from 06/23/2023 in [facility]  PHQ-2 Total Score 2 5 2 2 4   PHQ-9 Total Score 6 14 11 11 13    Flowsheet Row Admission (Discharged) from 06/06/2023 in [facility] from 04/21/2023 in [facility] Emergency Department at [facility] Office Visit from 08/12/2022 in [facility] at [place]  C-SSRS RISK CATEGORY No Risk No Risk No Risk     Assessment and Plan: This patient is a 68 year old male with a history of major depression generalized anxiety and PTSD regarding both of his children's deaths.  He seems to be stable on his current regimen.  He will continue Prozac  80 mg daily for major depression, Xanax  1 mg 3 times daily for anxiety, olanzapine  10 mg at bedtime for mood stabilization and gabapentin  600 mg 3 times daily for generalized anxiety.  He will return to see me in 4 months  Collaboration of Care: Collaboration of Care: Primary Care  Provider AEB notes are shared with PCP on the epic system  Patient/Guardian was advised Release of Information must be obtained prior to any record release in order to collaborate their care with an outside provider. Patient/Guardian was advised if they have not already done so to contact the registration department to sign all necessary forms in order for us  to release information regarding their care.   Consent: Patient/Guardian gives verbal consent for treatment and assignment of benefits for services provided during this visit. Patient/Guardian expressed understanding and agreed to proceed.    Barnie Gull, MD 05/02/2024, 11:52 AM

## 2024-05-15 ENCOUNTER — Ambulatory Visit: Admitting: Gastroenterology

## 2024-05-15 VITALS — BP 102/71 | HR 88 | Temp 98.7°F | Ht 70.0 in | Wt 151.6 lb

## 2024-05-15 DIAGNOSIS — K642 Third degree hemorrhoids: Secondary | ICD-10-CM

## 2024-05-15 NOTE — Progress Notes (Signed)
    [facility] BANDING PROCEDURE NOTE  JESSEN SIEGMAN is a 68 y.o. male presenting today for consideration of hemorrhoid banding. Last colonoscopy Dec 2024: hemorrhoids, diverticula, TI normal. He has had banding in 2015 and then again in 2020. He has had left lateral, right posterior, and is here for additional banding. Rectal bleeding has improved. Still with pressure at times. Diarrhea has improved with increasing Colestid .    The patient presents with symptomatic grade 3 hemorrhoids, unresponsive to maximal medical therapy, requesting rubber band ligation of his hemorrhoidal disease. All risks, benefits, and alternative forms of therapy were described and informed consent was obtained.  The decision was made to band the right anterior internal hemorrhoid, and the [facility] System was used to perform band ligation without complication. Digital anorectal examination was then performed to assure proper positioning of the band, and to adjust the banded tissue as required. The patient was discharged home without pain or other issues. Dietary and behavioral recommendations were given, along with follow-up instructions. The patient will return in several weeks for followup and possible additional banding as required.  No complications were encountered and the patient tolerated the procedure well.   Therisa MICAEL Stager, PhD, ANP-BC [facility]

## 2024-05-15 NOTE — Patient Instructions (Signed)
 I will see you in a few weeks for likely additional banding.   Continue current regimen, avoid straining, and limit toilet time to 2- !  Please call with any concerns!  I enjoyed seeing you again today! I value our relationship and want to provide genuine, compassionate, and quality care. You may receive a survey regarding your visit with me, and I welcome your feedback! Thanks so much for taking the time to complete this. I look forward to seeing you again.      Therisa MICAEL Stager, PhD, ANP-BC [facility]  t

## 2024-05-17 ENCOUNTER — Other Ambulatory Visit: Payer: Self-pay | Admitting: Gastroenterology

## 2024-05-17 ENCOUNTER — Other Ambulatory Visit: Payer: Self-pay | Admitting: Family Medicine

## 2024-05-17 ENCOUNTER — Other Ambulatory Visit (HOSPITAL_COMMUNITY): Payer: Self-pay | Admitting: Psychiatry

## 2024-05-17 ENCOUNTER — Other Ambulatory Visit: Payer: Self-pay | Admitting: Cardiology

## 2024-06-07 ENCOUNTER — Other Ambulatory Visit: Payer: Self-pay | Admitting: Family Medicine

## 2024-06-21 ENCOUNTER — Encounter: Admitting: Gastroenterology

## 2024-06-27 ENCOUNTER — Ambulatory Visit: Admitting: Family Medicine

## 2024-07-01 ENCOUNTER — Other Ambulatory Visit: Payer: Self-pay | Admitting: Family Medicine

## 2024-08-27 ENCOUNTER — Ambulatory Visit: Admitting: Cardiology

## 2024-08-30 ENCOUNTER — Ambulatory Visit (HOSPITAL_COMMUNITY): Admitting: Psychiatry

## 2025-01-18 ENCOUNTER — Ambulatory Visit
